# Patient Record
Sex: Female | Born: 1950 | Race: White | Hispanic: No | Marital: Married | Smoking: Never smoker
Health system: Southern US, Community
[De-identification: ages and names within clinical notes are randomized; demographics above are authoritative.]

## PROBLEM LIST (undated history)

## (undated) DIAGNOSIS — I8390 Asymptomatic varicose veins of unspecified lower extremity: Secondary | ICD-10-CM

## (undated) DIAGNOSIS — K219 Gastro-esophageal reflux disease without esophagitis: Secondary | ICD-10-CM

## (undated) DIAGNOSIS — F419 Anxiety disorder, unspecified: Secondary | ICD-10-CM

## (undated) DIAGNOSIS — F329 Major depressive disorder, single episode, unspecified: Secondary | ICD-10-CM

## (undated) DIAGNOSIS — G894 Chronic pain syndrome: Secondary | ICD-10-CM

## (undated) DIAGNOSIS — J189 Pneumonia, unspecified organism: Secondary | ICD-10-CM

## (undated) DIAGNOSIS — K449 Diaphragmatic hernia without obstruction or gangrene: Secondary | ICD-10-CM

## (undated) DIAGNOSIS — M1990 Unspecified osteoarthritis, unspecified site: Secondary | ICD-10-CM

## (undated) DIAGNOSIS — K2970 Gastritis, unspecified, without bleeding: Secondary | ICD-10-CM

## (undated) DIAGNOSIS — R5383 Other fatigue: Secondary | ICD-10-CM

## (undated) DIAGNOSIS — K589 Irritable bowel syndrome without diarrhea: Secondary | ICD-10-CM

## (undated) DIAGNOSIS — Z8781 Personal history of (healed) traumatic fracture: Secondary | ICD-10-CM

## (undated) DIAGNOSIS — F32A Depression, unspecified: Secondary | ICD-10-CM

## (undated) DIAGNOSIS — K314 Gastric diverticulum: Secondary | ICD-10-CM

## (undated) DIAGNOSIS — I10 Essential (primary) hypertension: Secondary | ICD-10-CM

## (undated) DIAGNOSIS — R51 Headache: Secondary | ICD-10-CM

## (undated) DIAGNOSIS — Z87442 Personal history of urinary calculi: Secondary | ICD-10-CM

## (undated) DIAGNOSIS — R112 Nausea with vomiting, unspecified: Secondary | ICD-10-CM

## (undated) DIAGNOSIS — K56609 Unspecified intestinal obstruction, unspecified as to partial versus complete obstruction: Secondary | ICD-10-CM

## (undated) HISTORY — DX: Personal history of (healed) traumatic fracture: Z87.81

## (undated) HISTORY — DX: Nausea with vomiting, unspecified: R11.2

## (undated) HISTORY — DX: Gastritis, unspecified, without bleeding: K29.70

## (undated) HISTORY — DX: Diaphragmatic hernia without obstruction or gangrene: K44.9

## (undated) HISTORY — DX: Pneumonia, unspecified organism: J18.9

## (undated) HISTORY — DX: Unspecified intestinal obstruction, unspecified as to partial versus complete obstruction: K56.609

## (undated) HISTORY — PX: OTHER SURGICAL HISTORY: SHX169

## (undated) HISTORY — PX: KNEE ARTHROSCOPY: SUR90

## (undated) HISTORY — DX: Irritable bowel syndrome, unspecified: K58.9

## (undated) HISTORY — DX: Other fatigue: R53.83

## (undated) HISTORY — DX: Unspecified osteoarthritis, unspecified site: M19.90

## (undated) HISTORY — DX: Asymptomatic varicose veins of unspecified lower extremity: I83.90

## (undated) HISTORY — DX: Gastric diverticulum: K31.4

---

## 1986-08-12 HISTORY — PX: ABDOMINAL HYSTERECTOMY: SHX81

## 1997-11-15 ENCOUNTER — Ambulatory Visit (HOSPITAL_COMMUNITY): Admission: RE | Admit: 1997-11-15 | Discharge: 1997-11-15 | Payer: Self-pay | Admitting: *Deleted

## 1999-06-05 ENCOUNTER — Ambulatory Visit (HOSPITAL_COMMUNITY): Admission: RE | Admit: 1999-06-05 | Discharge: 1999-06-05 | Payer: Self-pay | Admitting: Internal Medicine

## 1999-06-05 ENCOUNTER — Encounter: Payer: Self-pay | Admitting: Internal Medicine

## 2001-06-23 ENCOUNTER — Encounter: Admission: RE | Admit: 2001-06-23 | Discharge: 2001-09-21 | Payer: Self-pay | Admitting: Family Medicine

## 2002-08-08 ENCOUNTER — Encounter: Payer: Self-pay | Admitting: *Deleted

## 2002-08-08 ENCOUNTER — Observation Stay (HOSPITAL_COMMUNITY): Admission: EM | Admit: 2002-08-08 | Discharge: 2002-08-09 | Payer: Self-pay | Admitting: *Deleted

## 2002-08-08 ENCOUNTER — Encounter: Payer: Self-pay | Admitting: Emergency Medicine

## 2002-08-09 ENCOUNTER — Encounter: Payer: Self-pay | Admitting: Internal Medicine

## 2005-08-22 ENCOUNTER — Ambulatory Visit: Payer: Self-pay | Admitting: Critical Care Medicine

## 2005-09-05 ENCOUNTER — Ambulatory Visit: Payer: Self-pay | Admitting: Critical Care Medicine

## 2005-09-19 ENCOUNTER — Ambulatory Visit: Payer: Self-pay | Admitting: *Deleted

## 2005-09-19 ENCOUNTER — Encounter: Payer: Self-pay | Admitting: Critical Care Medicine

## 2005-10-10 ENCOUNTER — Ambulatory Visit: Payer: Self-pay | Admitting: Critical Care Medicine

## 2005-11-28 ENCOUNTER — Ambulatory Visit: Payer: Self-pay | Admitting: Internal Medicine

## 2005-12-19 ENCOUNTER — Ambulatory Visit (HOSPITAL_BASED_OUTPATIENT_CLINIC_OR_DEPARTMENT_OTHER): Admission: RE | Admit: 2005-12-19 | Discharge: 2005-12-19 | Payer: Self-pay | Admitting: Orthopedic Surgery

## 2006-01-30 ENCOUNTER — Ambulatory Visit: Payer: Self-pay | Admitting: Critical Care Medicine

## 2007-04-16 ENCOUNTER — Ambulatory Visit: Payer: Self-pay | Admitting: Critical Care Medicine

## 2007-06-26 DIAGNOSIS — J309 Allergic rhinitis, unspecified: Secondary | ICD-10-CM | POA: Insufficient documentation

## 2008-01-11 ENCOUNTER — Ambulatory Visit: Payer: Self-pay | Admitting: Critical Care Medicine

## 2008-04-01 ENCOUNTER — Encounter: Payer: Self-pay | Admitting: Critical Care Medicine

## 2008-04-27 ENCOUNTER — Emergency Department (HOSPITAL_BASED_OUTPATIENT_CLINIC_OR_DEPARTMENT_OTHER): Admission: EM | Admit: 2008-04-27 | Discharge: 2008-04-27 | Payer: Self-pay | Admitting: Emergency Medicine

## 2009-01-23 ENCOUNTER — Ambulatory Visit: Payer: Self-pay | Admitting: Critical Care Medicine

## 2009-06-06 ENCOUNTER — Ambulatory Visit (HOSPITAL_COMMUNITY): Admission: RE | Admit: 2009-06-06 | Discharge: 2009-06-06 | Payer: Self-pay | Admitting: Surgery

## 2009-06-07 ENCOUNTER — Ambulatory Visit: Payer: Self-pay | Admitting: Vascular Surgery

## 2009-06-07 ENCOUNTER — Encounter (INDEPENDENT_AMBULATORY_CARE_PROVIDER_SITE_OTHER): Payer: Self-pay | Admitting: Surgery

## 2009-06-07 ENCOUNTER — Ambulatory Visit (HOSPITAL_COMMUNITY): Admission: RE | Admit: 2009-06-07 | Discharge: 2009-06-07 | Payer: Self-pay | Admitting: Surgery

## 2009-06-21 ENCOUNTER — Ambulatory Visit (HOSPITAL_COMMUNITY): Admission: RE | Admit: 2009-06-21 | Discharge: 2009-06-21 | Payer: Self-pay | Admitting: Surgery

## 2009-06-26 ENCOUNTER — Encounter: Admission: RE | Admit: 2009-06-26 | Discharge: 2009-08-09 | Payer: Self-pay | Admitting: Surgery

## 2009-08-12 HISTORY — PX: HERNIA REPAIR: SHX51

## 2009-09-07 ENCOUNTER — Encounter: Admission: RE | Admit: 2009-09-07 | Discharge: 2009-12-06 | Payer: Self-pay | Admitting: Surgery

## 2009-09-12 DIAGNOSIS — K314 Gastric diverticulum: Secondary | ICD-10-CM

## 2009-09-12 DIAGNOSIS — K449 Diaphragmatic hernia without obstruction or gangrene: Secondary | ICD-10-CM

## 2009-09-12 HISTORY — DX: Diaphragmatic hernia without obstruction or gangrene: K44.9

## 2009-09-12 HISTORY — DX: Gastric diverticulum: K31.4

## 2009-09-28 ENCOUNTER — Ambulatory Visit (HOSPITAL_COMMUNITY): Admission: RE | Admit: 2009-09-28 | Discharge: 2009-09-28 | Payer: Self-pay | Admitting: Surgery

## 2009-11-13 ENCOUNTER — Inpatient Hospital Stay (HOSPITAL_COMMUNITY): Admission: AD | Admit: 2009-11-13 | Discharge: 2009-11-17 | Payer: Self-pay | Admitting: Surgery

## 2009-11-13 ENCOUNTER — Encounter (INDEPENDENT_AMBULATORY_CARE_PROVIDER_SITE_OTHER): Payer: Self-pay | Admitting: Surgery

## 2009-11-13 HISTORY — PX: GASTRIC BYPASS: SHX52

## 2009-11-14 ENCOUNTER — Ambulatory Visit: Payer: Self-pay | Admitting: Vascular Surgery

## 2009-11-14 ENCOUNTER — Encounter (INDEPENDENT_AMBULATORY_CARE_PROVIDER_SITE_OTHER): Payer: Self-pay | Admitting: Surgery

## 2009-12-21 ENCOUNTER — Ambulatory Visit (HOSPITAL_COMMUNITY): Admission: RE | Admit: 2009-12-21 | Discharge: 2009-12-21 | Payer: Self-pay | Admitting: Surgery

## 2010-01-05 ENCOUNTER — Emergency Department (HOSPITAL_COMMUNITY): Admission: EM | Admit: 2010-01-05 | Discharge: 2010-01-05 | Payer: Self-pay | Admitting: Emergency Medicine

## 2010-01-18 ENCOUNTER — Encounter: Admission: RE | Admit: 2010-01-18 | Discharge: 2010-01-18 | Payer: Self-pay | Admitting: Surgery

## 2010-02-05 ENCOUNTER — Inpatient Hospital Stay (HOSPITAL_COMMUNITY): Admission: EM | Admit: 2010-02-05 | Discharge: 2010-02-08 | Payer: Self-pay | Admitting: Emergency Medicine

## 2010-02-05 ENCOUNTER — Ambulatory Visit: Payer: Self-pay | Admitting: Cardiology

## 2010-02-06 ENCOUNTER — Ambulatory Visit: Payer: Self-pay | Admitting: Vascular Surgery

## 2010-02-06 ENCOUNTER — Encounter (INDEPENDENT_AMBULATORY_CARE_PROVIDER_SITE_OTHER): Payer: Self-pay | Admitting: Internal Medicine

## 2010-02-07 ENCOUNTER — Encounter (INDEPENDENT_AMBULATORY_CARE_PROVIDER_SITE_OTHER): Payer: Self-pay | Admitting: Internal Medicine

## 2010-02-27 ENCOUNTER — Inpatient Hospital Stay (HOSPITAL_COMMUNITY): Admission: EM | Admit: 2010-02-27 | Discharge: 2010-03-01 | Payer: Self-pay | Admitting: Surgery

## 2010-03-01 ENCOUNTER — Encounter: Payer: Self-pay | Admitting: Internal Medicine

## 2010-03-23 ENCOUNTER — Encounter (HOSPITAL_COMMUNITY)
Admission: RE | Admit: 2010-03-23 | Discharge: 2010-05-09 | Payer: Self-pay | Source: Home / Self Care | Admitting: Surgery

## 2010-04-18 ENCOUNTER — Ambulatory Visit (HOSPITAL_COMMUNITY): Admission: RE | Admit: 2010-04-18 | Discharge: 2010-04-18 | Payer: Self-pay | Admitting: Surgery

## 2010-04-20 ENCOUNTER — Encounter: Payer: Self-pay | Admitting: Internal Medicine

## 2010-04-25 ENCOUNTER — Encounter (INDEPENDENT_AMBULATORY_CARE_PROVIDER_SITE_OTHER): Payer: Self-pay | Admitting: *Deleted

## 2010-04-25 ENCOUNTER — Encounter: Admission: RE | Admit: 2010-04-25 | Discharge: 2010-05-11 | Payer: Self-pay | Admitting: Surgery

## 2010-05-01 ENCOUNTER — Ambulatory Visit: Payer: Self-pay | Admitting: Internal Medicine

## 2010-05-01 DIAGNOSIS — I10 Essential (primary) hypertension: Secondary | ICD-10-CM | POA: Insufficient documentation

## 2010-05-01 DIAGNOSIS — K219 Gastro-esophageal reflux disease without esophagitis: Secondary | ICD-10-CM | POA: Insufficient documentation

## 2010-05-01 DIAGNOSIS — F32A Depression, unspecified: Secondary | ICD-10-CM | POA: Insufficient documentation

## 2010-05-01 DIAGNOSIS — Z9884 Bariatric surgery status: Secondary | ICD-10-CM | POA: Insufficient documentation

## 2010-05-01 DIAGNOSIS — M1990 Unspecified osteoarthritis, unspecified site: Secondary | ICD-10-CM

## 2010-05-01 DIAGNOSIS — F329 Major depressive disorder, single episode, unspecified: Secondary | ICD-10-CM | POA: Insufficient documentation

## 2010-05-01 DIAGNOSIS — A0472 Enterocolitis due to Clostridium difficile, not specified as recurrent: Secondary | ICD-10-CM | POA: Insufficient documentation

## 2010-05-01 HISTORY — DX: Unspecified osteoarthritis, unspecified site: M19.90

## 2010-07-26 ENCOUNTER — Encounter
Admission: RE | Admit: 2010-07-26 | Discharge: 2010-09-11 | Payer: Self-pay | Source: Home / Self Care | Attending: Surgery | Admitting: Surgery

## 2010-08-12 HISTORY — PX: EXPLORATORY LAPAROTOMY: SUR591

## 2010-09-11 NOTE — Letter (Signed)
Summary: Discharge Summary  Discharge Summary   Imported By: Florinda Marker 05/07/2010 10:40:55  _____________________________________________________________________  External Attachment:    Type:   Image     Comment:   External Document

## 2010-09-11 NOTE — Consult Note (Signed)
Summary: New Pt. Referral: Dr. Luretha Murphy  New Pt. Referral: Dr. Luretha Murphy   Imported By: Florinda Marker 05/07/2010 10:35:30  _____________________________________________________________________  External Attachment:    Type:   Image     Comment:   External Document

## 2010-09-11 NOTE — Assessment & Plan Note (Signed)
Summary: Pulmonary OV   Primary Provider/Referring Provider:  Crawford Givens   CC:  Pt here for follow-up.   She says she is doing well.Marland Kitchen  History of Present Illness: Ms. 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     January 23, 2009 3:20 PM at last ov  1)  Stop Brovex 2)  Start Astepro two sprays each nostril twice daily 3)  Continue Flonase two sprays each nostril daily 4)  Stay on omeprazole daily Not seen since 6/09.  Not coughing much now.  Pn drip and GERD symptoms better.  Not hoarse. Only dyspneic if air is humid Pt denies any significant sore throat, nasal congestion or excess secretions, fever, chills, sweats, unintended weight loss, pleurtic or exertional chest pain, orthopnea PND, or leg swelling    Current Medications (verified): 1)  Avapro 300 Mg  Tabs (Irbesartan) .... One By Mouth Once Daily 2)  Cvs Omeprazole 20 Mg  Tbec (Omeprazole) .... One By Mouth Once Daily 3)  Paxil 30 Mg  Tabs (Paroxetine Hcl) .... One By Mouth Once Daily 4)  Astelin 137 Mcg/spray Soln (Azelastine Hcl) .Marland Kitchen.. 1 Spray in Each Nostril Two Times A Day As Needed 5)  Fluticasone Propionate 50 Mcg/act Susp (Fluticasone Propionate) .Marland Kitchen.. 1 Spray in Each Nostril Two Times A Day As Needed  Allergies (verified): 1)  ! * Ivp Dye 2)  ! Ace Inhibitors  Past History:  Past Medical History: Allergic Rhinitis G E R D  Social History: Patient never smoked.  Occupation:  Electronics engineer for [employer]  Review of Systems       The patient complains of non-productive cough.  The patient denies shortness of breath with activity, shortness of breath at rest, productive cough, coughing up blood, chest pain, irregular heartbeats, acid heartburn, indigestion, loss of appetite, weight change, abdominal pain, difficulty swallowing, sore throat, tooth/dental problems, headaches, nasal congestion/difficulty  breathing through nose, sneezing, itching, ear ache, anxiety, depression, hand/feet swelling, joint stiffness or pain, rash, change in color of mucus, and fever.    Vital Signs:  Patient profile:   60 year old female Height:      68 inches (172.72 cm) Weight:      294.13 pounds (133.70 kg) BMI:     44.88 O2 Sat:      98 % on Room air Temp:     98.6 degrees F (37.00 degrees C) oral Pulse rate:   92 / minute BP sitting:   140 / 86  (left arm) Cuff size:   large  Vitals Entered By: Michel Bickers CMA (January 23, 2009 3:17 PM)  O2 Flow:  Room air  Physical Exam  Additional Exam:  Gen: Pleasant, well nourished, in no distress ENT: no lesions, no postnasal drip Neck: No JVD, no TMG, no carotid bruits Lungs: no use of accessory muscles, no dullness to percussion, clear without rales or rhonchi Cardiovascular: RRR, heart sounds normal, no murmurs or gallops, no peripheral edema Musculoskeletal: No deformities, no cyanosis or clubbing     Impression & Recommendations:  Problem # 1:  ALLERGIC RHINITIS (ICD-477.9) Assessment Improved Cyclic cough due to prior ACE inhibitor use, GERD, allergic rhinitis with PN drip syndrome plan cont astepro and flonase Her updated medication list for this problem includes:    Astepro 0.15 % Soln (Azelastine hcl) .Marland Kitchen..Marland Kitchen Two sprays each nostril daily    Fluticasone Propionate 50 Mcg/act Susp (Fluticasone propionate) .Marland KitchenMarland KitchenMarland KitchenMarland Kitchen  1 spray in each nostril two times a day as needed  Orders: Est. Patient Level III (04540)  Medications Added to Medication List This Visit: 1)  Astepro 0.15 % Soln (Azelastine hcl) .... Two sprays each nostril daily 2)  Fluticasone Propionate 50 Mcg/act Susp (Fluticasone propionate) .Marland Kitchen.. 1 spray in each nostril two times a day as needed  Complete Medication List: 1)  Avapro 300 Mg Tabs (Irbesartan) .... One by mouth once daily 2)  Cvs Omeprazole 20 Mg Tbec (Omeprazole) .... One by mouth once daily 3)  Paxil 30 Mg Tabs (Paroxetine hcl)  .... One by mouth once daily 4)  Astepro 0.15 % Soln (Azelastine hcl) .... Two sprays each nostril daily 5)  Fluticasone Propionate 50 Mcg/act Susp (Fluticasone propionate) .Marland Kitchen.. 1 spray in each nostril two times a day as needed  Patient Instructions: 1)  No change in medications 2)  Refills sent for astepro/omeprazole/flonase to [facility] 3)  Return 12months or as needed Prescriptions: ASTEPRO 0.15 % SOLN (AZELASTINE HCL) Two sprays each nostril daily Brand medically necessary #3 x 4   Entered and Authorized by:   Storm Frisk MD   Signed by:   Storm Frisk MD on 01/23/2009   Method used:   Faxed to ...       [facility]* (mail-order)             ,          Ph: [phone]       Fax: [phone]   RxID:   [id number] [facility] OMEPRAZOLE 20 MG  TBEC (OMEPRAZOLE) one by mouth once daily  #90 x 4   Entered and Authorized by:   Storm Frisk MD   Signed by:   Storm Frisk MD on 01/23/2009   Method used:   Faxed to ...       [facility]* (mail-order)             ,          Ph: [phone]       Fax: [phone]   RxID:   [id number] FLUTICASONE PROPIONATE 50 MCG/ACT SUSP (FLUTICASONE PROPIONATE) 1 spray in each nostril two times a day as needed  #3 x 4   Entered and Authorized by:   Storm Frisk MD   Signed by:   Storm Frisk MD on 01/23/2009   Method used:   Faxed to ...       [facility]* (mail-order)             ,          Ph: [phone]       Fax: [phone]   RxID:   [id number]

## 2010-09-11 NOTE — Medication Information (Signed)
Summary: Astelin nasal spray/[facility]  Astelin nasal spray/[facility]   Imported By: Lester Carlin 04/27/2008 07:47:22  _____________________________________________________________________  External Attachment:    Type:   Image     Comment:   External Document

## 2010-09-11 NOTE — Assessment & Plan Note (Signed)
Summary: new pt c diff/ liver lesions/   Primary Provider:  Crawford Givens   CC:  lesions on liver found on liver on CT.  History of Present Illness: Kari Gibbs is a 60 year old who was referred to be by Dr. Daphine Deutscher for evaluation of a possible liver abscess.  She underwent a gastric bypass in April of this year.  Initially she did well postoperatively but then she started to feel much worse. She developed a horrible taste in her mouth, worsening acid reflux, worsening chronic epigastric pain, and diarrhea.  She was found to have Clostridium difficile colitis on June 21 and was started on oral Flagyl which he took for about 3 weeks.  She had partial improvement but a repeat tests showed that the colitis was still present.  She was hospitalized and started on Flagyl again.  Eventually she was treated with vancomycin enemas giving a total of 8 treatments completing therapy on August 26.  She says that she felt "like crap when she was on the antibiotics." She saw Dr. Daphine Deutscher about two weeks ago when told him about this and he ordered a CT scan of her abdomen and pelvis.  There were no changes to suggest any postoperative complications.  The colitis appeared to have resolved.  However there was a 2 x 3.2 cm lesion in the liver near the falciform ligament that was not seen on prior scans.  Of note previous scans have not used IV contrast.  Over the last two weeks she has felt much better.  She's not had any fever, chills, or sweats.  Her diarrhea has resolved and her appetite is improving.  A horrible taste in her mouth is also improving.  She is now working full-time at home, getting out of the house more and returning to normal activities.  She does continue to have intermittent sharp epigastric pain that she describes feeling like a "hot poker". This pain is chronic and dates back at least 6 years prior to her surgery.  It does not sound like it has changed in severity or frequency.  Preventive  Screening-Counseling & Management  Alcohol-Tobacco     Alcohol drinks/day: 0     Smoking Status: never  Caffeine-Diet-Exercise     Caffeine use/day: no     Does Patient Exercise: no  Safety-Violence-Falls     Seat Belt Use: yes   Current Allergies (reviewed today): ! * IVP DYE ! ACE INHIBITORS ! LOSARTAN POTASSIUM (LOSARTAN POTASSIUM) Past History:  Past Medical History: Allergic rhinitis Hypertension GERD Depression Osteoarthritis  Past Surgical History: Appendectomy Exploratory laparotomy Gastric bypass Hysterectomy  Vital Signs:  Patient profile:   60 year old female Height:      68 inches (172.72 cm) Weight:      195.0 pounds (88.64 kg) BMI:     29.76 Temp:     98.0 degrees F (36.67 degrees C) oral Pulse rate:   96 / minute BP sitting:   111 / 77  (left arm) Cuff size:   large  Vitals Entered By: Jennet Maduro RN (May 01, 2010 2:29 PM) CC: lesions on liver found on liver on CT Is Patient Diabetic? No Pain Assessment Patient in pain? no      Nutritional Status BMI of > 30 = obese Nutritional Status Detail appetite "OK, not great"  Have you ever been in a relationship where you felt threatened, hurt or afraid?not asked, husband present   Does patient need assistance? Functional Status Self care Ambulation Normal  Physical Exam  General:  alert and overweight-appearing.  she has lost 39 kg since her surgery in April. Lungs:  normal breath sounds, no crackles, and no wheezes.   Heart:  normal rate, regular rhythm, and no murmur.   Abdomen:  soft, non-tender, and normal bowel sounds.  her surgical incisions are well healed.   Impression & Recommendations:  Problem # 1:  LIVER LESION (ICD-580.81) I have not actually certain what is causing her liver lesion but her recent improvement suggests that this is probably not a liver abscess.  I discussed the CT findings with Dr. Norva Pavlov who read the scan.  She felt like the small fluid  collection could be benign ascites or a postoperative change from her laparoscopic surgery. I have discussed options with Kari Gibbs and has suggested that it may be best to take a wait and see approach.  As long as she is continuing to improve I am comfortable not doing any further diagnostic evaluation of this fluid collection.  Of course if she has fever or other signs of infection then I would discuss needle aspiration of the collection with interventional radiology.  She and her husband are comfortable with this recommendation. Orders: Consultation Level IV (16109)  Medications Added to Medication List This Visit: 1)  Align Caps (Probiotic product) .... Take 1 capsule by mouth at bedtime per pt. 2)  Melatonin 5 Mg Tabs (Melatonin) .... Take 1 tablet by mouth at bedtime per pt.  Patient Instructions: 1)  Please schedule a follow-up appointment in 4 weeks.

## 2010-09-11 NOTE — Assessment & Plan Note (Signed)
Summary: Pulmonary OV   Chief Complaint:  fu office visit...Marland KitchenMarland KitchenMarland Kitchenreviewed meds.....  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  She has done well since being switched to Avapro and is off lisinopril, maintains fluticasone 2 sprays each nostril daily, omeprazole 20 mg daily.  She is also on brompheniramine p.r.n.   here for f/u and med review:  Last few months her cough is stable.  In the springtime she gets more postnasal drip.  she cannot access brovex any longer  (it is off the market).  She does use flonase daily..  There is no heartburn.  There is no chest pain.  She is here for pulmonary f/u.       Current Allergies: ! * IVP DYE ! ACE INHIBITORS  Past Medical History:    Reviewed history from 06/26/2007 and no changes required:       Allergic Rhinitis       G E R D              Current Meds:        AVAPRO 300 MG  TABS (IRBESARTAN) one by mouth once daily       CVS OMEPRAZOLE 20 MG  TBEC (OMEPRAZOLE) one by mouth once daily       PAXIL 30 MG  TABS (PAROXETINE HCL) one by mouth once daily       BROVEX ADT 12-10 MG/5ML  SUSP (BROMPHEN TANN-PHENYLEPH TANN) 5cc by mouth two times a day as needed              Allergies: * IVP DYE, ACE INHIBITORS.     Family History:    Reviewed history and no changes required:       non contrib  Social History:    Reviewed history and no changes required:       Patient never smoked.    Risk Factors:  Tobacco use:  never   Review of Systems      See HPI  The patient denies anorexia, fever, weight loss, weight gain, hoarseness, chest pain, syncope, dyspnea on exhertion, peripheral edema, and prolonged cough.     Vital Signs:  Patient Profile:   60 Years Old Female Height:     66 inches Weight:      281.13 pounds BMI:     45.54 O2 Sat:      94 % O2 treatment:    Room Air Temp:     98.0  degrees F oral Pulse rate:   71 / minute BP sitting:   140 / 88  (left arm) Cuff size:   regular  Pt. in pain?   no  Vitals Entered By: Clarise Cruz Duncan Dull) (January 11, 2008 2:09 PM)                  Physical Exam  Gen: obest WF        in NAD    NCAT Heent:  no jvd, no TMG, no cervical LNademopathy, orophyx clear,  nares with clear watery drainage. Cor: RRR nl s1/s2  no s3/s4  no m r h g Abd: soft NT BSA   no masses  No HSM  no rebound or guarding Ext perfused with no c v e v.d Neuro: intact, moves all 4s, CN II-XII intact, DTRs intact Chest:  no wheezes, rales, rhonchi   no egophony  no consolidative  breath sounds, Skin: clear  Genital/Rectal :deferred      Impression & Recommendations:  Problem # 1:  G E R D (ICD-530.81) Assessment: Improved Stable reflux disease plan: cont daily PPI  Her updated medication list for this problem includes:    Cvs Omeprazole 20 Mg Tbec (Omeprazole) ..... One by mouth once daily  Orders: Est. Patient Level III (82956)   Problem # 2:  ALLERGIC RHINITIS (ICD-477.9) Assessment: Improved Post nasal drip syndrome exacerbating cyclic cough plan: Add astelin twice daily Cont flonase daily stop Brovex  Her updated medication list for this problem includes:    Astelin 137 Mcg/spray Soln (Azelastine hcl) .Marland Kitchen..Marland Kitchen Two sprays each nostril two times a day    Flonase 50 Mcg/act Susp (Fluticasone propionate) .Marland Kitchen..Marland Kitchen Two puffs each nostril daily  Orders: Est. Patient Level III (21308)   Medications Added to Medication List This Visit: 1)  Astelin 137 Mcg/spray Soln (Azelastine hcl) .... Two sprays each nostril two times a day 2)  Flonase 50 Mcg/act Susp (Fluticasone propionate) .... Two puffs each nostril daily   Patient Instructions: 1)  Stop Brovex 2)  Start Astepro two sprays each nostril twice daily 3)  Continue Flonase two sprays each nostril daily 4)  Stay on omeprazole daily 5)  Please schedule a follow-up appointment in 6  months.   Prescriptions: FLONASE 50 MCG/ACT  SUSP (FLUTICASONE PROPIONATE) Two puffs each nostril daily  #3 x 4   Entered and Authorized by:   Storm Frisk MD   Signed by:   Storm Frisk MD on 01/11/2008   Method used:   Print then Give to Patient   RxID:   [id number] ASTELIN 137 MCG/SPRAY  SOLN (AZELASTINE HCL) two sprays each nostril two times a day Brand medically necessary #3 x 4   Entered and Authorized by:   Storm Frisk MD   Signed by:   Storm Frisk MD on 01/11/2008   Method used:   Print then Give to Patient   RxID:   [id number]  ]

## 2010-09-11 NOTE — Miscellaneous (Signed)
Summary: HIPAA Restrictions  HIPAA Restrictions   Imported By: Florinda Marker 05/03/2010 16:03:59  _____________________________________________________________________  External Attachment:    Type:   Image     Comment:   External Document

## 2010-09-11 NOTE — Miscellaneous (Signed)
Summary: Problems, Medications and Allergies updated  Clinical Lists Changes  Problems: Added new problem of BARIATRIC SURGERY STATUS (ICD-V45.86) Added new problem of CLOSTRIDIUM DIFFICILE COLITIS (ICD-008.45) Medications: Added new medication of CALCIUM-VITAMIN D 500-200 MG-UNIT TABS (CALCIUM CARBONATE-VITAMIN D) Take 1 tablet by mouth three times a day Added new medication of FLONASE 50 MCG/ACT SUSP (FLUTICASONE PROPIONATE) One spray each nostril every morning Added new medication of MICARDIS 40 MG TABS (TELMISARTAN) Take 1 tablet by mouth once a day per PCP Added new medication of MULTIVITAMINS  TABS (MULTIPLE VITAMIN) Take 1 tablet by mouth once a day Added new medication of VITAMIN B-12 500 MCG TABS (CYANOCOBALAMIN) Take 1 tablet by mouth once a day Allergies: Added new allergy or adverse reaction of LOSARTAN POTASSIUM (LOSARTAN POTASSIUM)

## 2010-10-17 ENCOUNTER — Encounter: Payer: Self-pay | Admitting: *Deleted

## 2010-10-27 LAB — COMPREHENSIVE METABOLIC PANEL
ALT: 27 U/L (ref 0–35)
AST: 38 U/L — ABNORMAL HIGH (ref 0–37)
Albumin: 3.9 g/dL (ref 3.5–5.2)
Alkaline Phosphatase: 61 U/L (ref 39–117)
BUN: 8 mg/dL (ref 6–23)
CO2: 21 mEq/L (ref 19–32)
Calcium: 9.3 mg/dL (ref 8.4–10.5)
Chloride: 104 mEq/L (ref 96–112)
Creatinine, Ser: 0.87 mg/dL (ref 0.4–1.2)
GFR calc Af Amer: >60 mL/min (ref >60)
GFR calc non Af Amer: >60 mL/min (ref >60)
Glucose, Bld: 100 mg/dL — ABNORMAL HIGH (ref 70–99)
Potassium: 3.2 mEq/L — ABNORMAL LOW (ref 3.5–5.1)
Sodium: 139 mEq/L (ref 135–145)
Total Bilirubin: 1 mg/dL (ref 0.3–1.2)
Total Protein: 6.8 g/dL (ref 6.0–8.3)

## 2010-10-27 LAB — DIFFERENTIAL
Basophils Absolute: 0 10*3/uL (ref 0.0–0.1)
Basophils Relative: 0 % (ref 0–1)
Eosinophils Absolute: 0.1 10*3/uL (ref 0.0–0.7)
Eosinophils Relative: 2 % (ref 0–5)
Lymphocytes Relative: 27 % (ref 12–46)
Lymphs Abs: 1.5 10*3/uL (ref 0.7–4.0)
Monocytes Absolute: 0.7 10*3/uL (ref 0.1–1.0)
Monocytes Relative: 12 % (ref 3–12)
Neutro Abs: 3.3 10*3/uL (ref 1.7–7.7)
Neutrophils Relative %: 59 % (ref 43–77)

## 2010-10-27 LAB — CBC
HCT: 40.4 % (ref 36.0–46.0)
Hemoglobin: 13.8 g/dL (ref 12.0–15.0)
MCH: 30 pg (ref 26.0–34.0)
MCHC: 34.2 g/dL (ref 30.0–36.0)
MCV: 87.7 fL (ref 78.0–100.0)
Platelets: 247 10*3/uL (ref 150–400)
RBC: 4.6 MIL/uL (ref 3.87–5.11)
RDW: 15.3 % (ref 11.5–15.5)
WBC: 5.7 10*3/uL (ref 4.0–10.5)

## 2010-10-27 LAB — URINALYSIS, MICROSCOPIC ONLY
Glucose, UA: NEGATIVE mg/dL
Ketones, ur: 15 mg/dL — AB
Nitrite: NEGATIVE
Protein, ur: NEGATIVE mg/dL
Specific Gravity, Urine: 1.015 (ref 1.005–1.030)
Urobilinogen, UA: 0.2 mg/dL (ref 0.0–1.0)
pH: 5.5 (ref 5.0–8.0)

## 2010-10-27 LAB — LIPASE, BLOOD: Lipase: 27 U/L (ref 11–59)

## 2010-10-27 LAB — AMYLASE: Amylase: 30 U/L (ref 0–105)

## 2010-10-28 LAB — CBC
HCT: 37.7 % (ref 36.0–46.0)
HCT: 41.1 % (ref 36.0–46.0)
Hemoglobin: 12.7 g/dL (ref 12.0–15.0)
Hemoglobin: 14.2 g/dL (ref 12.0–15.0)
MCH: 30 pg (ref 26.0–34.0)
MCH: 30.5 pg (ref 26.0–34.0)
MCHC: 33.7 g/dL (ref 30.0–36.0)
MCHC: 34.5 g/dL (ref 30.0–36.0)
MCV: 88.5 fL (ref 78.0–100.0)
MCV: 89 fL (ref 78.0–100.0)
Platelets: 219 10*3/uL (ref 150–400)
Platelets: 266 10*3/uL (ref 150–400)
RBC: 4.24 MIL/uL (ref 3.87–5.11)
RBC: 4.64 MIL/uL (ref 3.87–5.11)
RDW: 15.1 % (ref 11.5–15.5)
RDW: 15.5 % (ref 11.5–15.5)
WBC: 5.6 10*3/uL (ref 4.0–10.5)
WBC: 6 10*3/uL (ref 4.0–10.5)

## 2010-10-28 LAB — DIFFERENTIAL
Basophils Absolute: 0 10*3/uL (ref 0.0–0.1)
Basophils Relative: 0 % (ref 0–1)
Eosinophils Absolute: 0.1 10*3/uL (ref 0.0–0.7)
Eosinophils Relative: 2 % (ref 0–5)
Lymphocytes Relative: 32 % (ref 12–46)
Lymphs Abs: 1.9 10*3/uL (ref 0.7–4.0)
Monocytes Absolute: 0.6 10*3/uL (ref 0.1–1.0)
Monocytes Relative: 10 % (ref 3–12)
Neutro Abs: 3.4 10*3/uL (ref 1.7–7.7)
Neutrophils Relative %: 57 % (ref 43–77)

## 2010-10-28 LAB — COMPREHENSIVE METABOLIC PANEL
ALT: 22 U/L (ref 0–35)
AST: 31 U/L (ref 0–37)
Albumin: 3.7 g/dL (ref 3.5–5.2)
Alkaline Phosphatase: 75 U/L (ref 39–117)
BUN: 6 mg/dL (ref 6–23)
CO2: 28 mEq/L (ref 19–32)
Calcium: 8.9 mg/dL (ref 8.4–10.5)
Chloride: 104 mEq/L (ref 96–112)
Creatinine, Ser: 0.63 mg/dL (ref 0.4–1.2)
GFR calc Af Amer: >60 mL/min (ref >60)
GFR calc non Af Amer: >60 mL/min (ref >60)
Glucose, Bld: 105 mg/dL — ABNORMAL HIGH (ref 70–99)
Potassium: 2.7 mEq/L — CL (ref 3.5–5.1)
Sodium: 141 mEq/L (ref 135–145)
Total Bilirubin: 1 mg/dL (ref 0.3–1.2)
Total Protein: 6.5 g/dL (ref 6.0–8.3)

## 2010-10-28 LAB — BASIC METABOLIC PANEL
BUN: 4 mg/dL — ABNORMAL LOW (ref 6–23)
BUN: 5 mg/dL — ABNORMAL LOW (ref 6–23)
BUN: 7 mg/dL (ref 6–23)
CO2: 22 mEq/L (ref 19–32)
CO2: 29 mEq/L (ref 19–32)
CO2: 30 mEq/L (ref 19–32)
Calcium: 8.4 mg/dL (ref 8.4–10.5)
Calcium: 8.7 mg/dL (ref 8.4–10.5)
Calcium: 8.9 mg/dL (ref 8.4–10.5)
Chloride: 104 mEq/L (ref 96–112)
Chloride: 108 mEq/L (ref 96–112)
Chloride: 108 mEq/L (ref 96–112)
Creatinine, Ser: 0.59 mg/dL (ref 0.4–1.2)
Creatinine, Ser: 0.66 mg/dL (ref 0.4–1.2)
Creatinine, Ser: 0.76 mg/dL (ref 0.4–1.2)
GFR calc Af Amer: >60 mL/min (ref >60)
GFR calc Af Amer: >60 mL/min (ref >60)
GFR calc Af Amer: >60 mL/min (ref >60)
GFR calc non Af Amer: >60 mL/min (ref >60)
GFR calc non Af Amer: >60 mL/min (ref >60)
GFR calc non Af Amer: >60 mL/min (ref >60)
Glucose, Bld: 132 mg/dL — ABNORMAL HIGH (ref 70–99)
Glucose, Bld: 93 mg/dL (ref 70–99)
Glucose, Bld: 95 mg/dL (ref 70–99)
Potassium: 3.3 mEq/L — ABNORMAL LOW (ref 3.5–5.1)
Potassium: 3.8 mEq/L (ref 3.5–5.1)
Potassium: 4.1 mEq/L (ref 3.5–5.1)
Sodium: 139 mEq/L (ref 135–145)
Sodium: 141 mEq/L (ref 135–145)
Sodium: 144 mEq/L (ref 135–145)

## 2010-10-28 LAB — URINALYSIS, ROUTINE W REFLEX MICROSCOPIC
Glucose, UA: NEGATIVE mg/dL
Hgb urine dipstick: NEGATIVE
Ketones, ur: 15 mg/dL — AB
Nitrite: POSITIVE — AB
Protein, ur: NEGATIVE mg/dL
Specific Gravity, Urine: 1.015 (ref 1.005–1.030)
Urobilinogen, UA: 0.2 mg/dL (ref 0.0–1.0)
pH: 6 (ref 5.0–8.0)

## 2010-10-28 LAB — URINE CULTURE: Colony Count: 35000

## 2010-10-28 LAB — POCT CARDIAC MARKERS
CKMB, poc: <1 ng/mL — ABNORMAL LOW (ref 1.0–8.0)
Myoglobin, poc: 60 ng/mL (ref 12–200)
Troponin i, poc: <0.05 ng/mL (ref 0.00–0.09)

## 2010-10-28 LAB — GLUCOSE, CAPILLARY
Glucose-Capillary: 100 mg/dL — ABNORMAL HIGH (ref 70–99)
Glucose-Capillary: 104 mg/dL — ABNORMAL HIGH (ref 70–99)
Glucose-Capillary: 108 mg/dL — ABNORMAL HIGH (ref 70–99)
Glucose-Capillary: 87 mg/dL (ref 70–99)
Glucose-Capillary: 88 mg/dL (ref 70–99)
Glucose-Capillary: 91 mg/dL (ref 70–99)
Glucose-Capillary: 93 mg/dL (ref 70–99)
Glucose-Capillary: 94 mg/dL (ref 70–99)
Glucose-Capillary: 98 mg/dL (ref 70–99)

## 2010-10-28 LAB — URINE MICROSCOPIC-ADD ON

## 2010-10-28 LAB — TROPONIN I
Troponin I: 0.01 ng/mL (ref 0.00–0.06)
Troponin I: 0.01 ng/mL (ref 0.00–0.06)
Troponin I: 0.02 ng/mL (ref 0.00–0.06)

## 2010-10-28 LAB — CK TOTAL AND CKMB (NOT AT ARMC)
CK, MB: 0.4 ng/mL (ref 0.3–4.0)
CK, MB: 0.4 ng/mL (ref 0.3–4.0)
CK, MB: 0.5 ng/mL (ref 0.3–4.0)
Relative Index: INVALID (ref 0.0–2.5)
Relative Index: INVALID (ref 0.0–2.5)
Relative Index: INVALID (ref 0.0–2.5)
Total CK: 39 U/L (ref 7–177)
Total CK: 45 U/L (ref 7–177)
Total CK: 46 U/L (ref 7–177)

## 2010-10-28 LAB — D-DIMER, QUANTITATIVE: D-Dimer, Quant: <0.22 ug/mL-FEU (ref 0.00–0.48)

## 2010-10-29 LAB — URINALYSIS, ROUTINE W REFLEX MICROSCOPIC
Glucose, UA: NEGATIVE mg/dL
Ketones, ur: 15 mg/dL — AB
Leukocytes, UA: NEGATIVE
Nitrite: NEGATIVE
Protein, ur: 30 mg/dL — AB
Specific Gravity, Urine: 1.026 (ref 1.005–1.030)
Urobilinogen, UA: 0.2 mg/dL (ref 0.0–1.0)
pH: 5.5 (ref 5.0–8.0)

## 2010-10-29 LAB — POCT I-STAT, CHEM 8
BUN: 11 mg/dL (ref 6–23)
Calcium, Ion: 1.09 mmol/L — ABNORMAL LOW (ref 1.12–1.32)
Chloride: 108 mEq/L (ref 96–112)
Creatinine, Ser: 0.6 mg/dL (ref 0.4–1.2)
Glucose, Bld: 106 mg/dL — ABNORMAL HIGH (ref 70–99)
HCT: 44 % (ref 36.0–46.0)
Hemoglobin: 15 g/dL (ref 12.0–15.0)
Potassium: 3.6 mEq/L (ref 3.5–5.1)
Sodium: 142 mEq/L (ref 135–145)
TCO2: 22 mmol/L (ref 0–100)

## 2010-10-29 LAB — DIFFERENTIAL
Basophils Absolute: 0 10*3/uL (ref 0.0–0.1)
Basophils Relative: 1 % (ref 0–1)
Eosinophils Absolute: 0.1 10*3/uL (ref 0.0–0.7)
Eosinophils Relative: 1 % (ref 0–5)
Lymphocytes Relative: 24 % (ref 12–46)
Lymphs Abs: 1.2 10*3/uL (ref 0.7–4.0)
Monocytes Absolute: 0.4 10*3/uL (ref 0.1–1.0)
Monocytes Relative: 8 % (ref 3–12)
Neutro Abs: 3.2 10*3/uL (ref 1.7–7.7)
Neutrophils Relative %: 66 % (ref 43–77)

## 2010-10-29 LAB — CBC
HCT: 43.2 % (ref 36.0–46.0)
Hemoglobin: 14.9 g/dL (ref 12.0–15.0)
MCHC: 34.5 g/dL (ref 30.0–36.0)
MCV: 89 fL (ref 78.0–100.0)
Platelets: 187 10*3/uL (ref 150–400)
RBC: 4.85 MIL/uL (ref 3.87–5.11)
RDW: 15 % (ref 11.5–15.5)
WBC: 4.8 10*3/uL (ref 4.0–10.5)

## 2010-10-29 LAB — URINE MICROSCOPIC-ADD ON

## 2010-10-29 LAB — URINE CULTURE: Colony Count: 6000

## 2010-10-31 LAB — CBC
HCT: 35 % — ABNORMAL LOW (ref 36.0–46.0)
HCT: 38.6 % (ref 36.0–46.0)
Hemoglobin: 11.8 g/dL — ABNORMAL LOW (ref 12.0–15.0)
Hemoglobin: 12.8 g/dL (ref 12.0–15.0)
MCHC: 33.1 g/dL (ref 30.0–36.0)
MCHC: 33.7 g/dL (ref 30.0–36.0)
MCV: 88.7 fL (ref 78.0–100.0)
MCV: 89.2 fL (ref 78.0–100.0)
Platelets: 248 10*3/uL (ref 150–400)
Platelets: 249 10*3/uL (ref 150–400)
RBC: 3.94 MIL/uL (ref 3.87–5.11)
RBC: 4.33 MIL/uL (ref 3.87–5.11)
RDW: 14.1 % (ref 11.5–15.5)
RDW: 14.3 % (ref 11.5–15.5)
WBC: 11.8 10*3/uL — ABNORMAL HIGH (ref 4.0–10.5)
WBC: 11.8 10*3/uL — ABNORMAL HIGH (ref 4.0–10.5)

## 2010-10-31 LAB — CLOTEST (H. PYLORI), BIOPSY: Helicobacter screen: NEGATIVE

## 2010-10-31 LAB — DIFFERENTIAL
Basophils Absolute: 0 10*3/uL (ref 0.0–0.1)
Basophils Absolute: 0 10*3/uL (ref 0.0–0.1)
Basophils Relative: 0 % (ref 0–1)
Basophils Relative: 0 % (ref 0–1)
Eosinophils Absolute: 0 10*3/uL (ref 0.0–0.7)
Eosinophils Absolute: 0 10*3/uL (ref 0.0–0.7)
Eosinophils Relative: 0 % (ref 0–5)
Eosinophils Relative: 0 % (ref 0–5)
Lymphocytes Relative: 15 % (ref 12–46)
Lymphocytes Relative: 6 % — ABNORMAL LOW (ref 12–46)
Lymphs Abs: 0.6 10*3/uL — ABNORMAL LOW (ref 0.7–4.0)
Lymphs Abs: 1.8 10*3/uL (ref 0.7–4.0)
Monocytes Absolute: 0.3 10*3/uL (ref 0.1–1.0)
Monocytes Absolute: 0.8 10*3/uL (ref 0.1–1.0)
Monocytes Relative: 3 % (ref 3–12)
Monocytes Relative: 7 % (ref 3–12)
Neutro Abs: 10.9 10*3/uL — ABNORMAL HIGH (ref 1.7–7.7)
Neutro Abs: 9.2 10*3/uL — ABNORMAL HIGH (ref 1.7–7.7)
Neutrophils Relative %: 78 % — ABNORMAL HIGH (ref 43–77)
Neutrophils Relative %: 92 % — ABNORMAL HIGH (ref 43–77)

## 2010-10-31 LAB — BASIC METABOLIC PANEL
BUN: 26 mg/dL — ABNORMAL HIGH (ref 6–23)
CO2: 25 mEq/L (ref 19–32)
Calcium: 9.2 mg/dL (ref 8.4–10.5)
Chloride: 107 mEq/L (ref 96–112)
Creatinine, Ser: 0.73 mg/dL (ref 0.4–1.2)
GFR calc Af Amer: >60 mL/min (ref >60)
GFR calc non Af Amer: >60 mL/min (ref >60)
Glucose, Bld: 112 mg/dL — ABNORMAL HIGH (ref 70–99)
Potassium: 4 mEq/L (ref 3.5–5.1)
Sodium: 139 mEq/L (ref 135–145)

## 2010-10-31 LAB — HEMOGLOBIN AND HEMATOCRIT, BLOOD
HCT: 37.3 % (ref 36.0–46.0)
HCT: 40.7 % (ref 36.0–46.0)
Hemoglobin: 12.4 g/dL (ref 12.0–15.0)
Hemoglobin: 13.7 g/dL (ref 12.0–15.0)

## 2010-10-31 LAB — MRSA PCR SCREENING: MRSA by PCR: NEGATIVE

## 2010-11-01 ENCOUNTER — Other Ambulatory Visit (HOSPITAL_COMMUNITY): Payer: Self-pay | Admitting: Surgery

## 2010-11-05 ENCOUNTER — Other Ambulatory Visit (HOSPITAL_COMMUNITY): Payer: Self-pay | Admitting: Surgery

## 2010-11-05 ENCOUNTER — Ambulatory Visit (HOSPITAL_COMMUNITY)
Admission: RE | Admit: 2010-11-05 | Discharge: 2010-11-05 | Disposition: A | Discharge status: Home / Self Care | Payer: 59 | Source: Ambulatory Visit | Attending: Surgery | Admitting: Surgery

## 2010-11-05 DIAGNOSIS — R109 Unspecified abdominal pain: Secondary | ICD-10-CM | POA: Insufficient documentation

## 2010-11-05 DIAGNOSIS — K7689 Other specified diseases of liver: Secondary | ICD-10-CM | POA: Insufficient documentation

## 2010-11-05 DIAGNOSIS — Z9884 Bariatric surgery status: Secondary | ICD-10-CM | POA: Insufficient documentation

## 2010-11-05 LAB — COMPREHENSIVE METABOLIC PANEL
ALT: 21 U/L (ref 0–35)
AST: 21 U/L (ref 0–37)
Albumin: 4 g/dL (ref 3.5–5.2)
Alkaline Phosphatase: 78 U/L (ref 39–117)
BUN: 23 mg/dL (ref 6–23)
CO2: 30 mEq/L (ref 19–32)
Calcium: 9.6 mg/dL (ref 8.4–10.5)
Chloride: 105 mEq/L (ref 96–112)
Creatinine, Ser: 0.76 mg/dL (ref 0.4–1.2)
GFR calc Af Amer: >60 mL/min (ref >60)
GFR calc non Af Amer: >60 mL/min (ref >60)
Glucose, Bld: 101 mg/dL — ABNORMAL HIGH (ref 70–99)
Potassium: 4.4 mEq/L (ref 3.5–5.1)
Sodium: 142 mEq/L (ref 135–145)
Total Bilirubin: 0.8 mg/dL (ref 0.3–1.2)
Total Protein: 7.5 g/dL (ref 6.0–8.3)

## 2010-11-05 LAB — DIFFERENTIAL
Basophils Absolute: 0 10*3/uL (ref 0.0–0.1)
Basophils Relative: 0 % (ref 0–1)
Eosinophils Absolute: 0.1 10*3/uL (ref 0.0–0.7)
Eosinophils Relative: 2 % (ref 0–5)
Lymphocytes Relative: 24 % (ref 12–46)
Lymphs Abs: 1.4 10*3/uL (ref 0.7–4.0)
Monocytes Absolute: 0.4 10*3/uL (ref 0.1–1.0)
Monocytes Relative: 7 % (ref 3–12)
Neutro Abs: 3.8 10*3/uL (ref 1.7–7.7)
Neutrophils Relative %: 67 % (ref 43–77)

## 2010-11-05 LAB — CREATININE, SERUM
Creatinine, Ser: 0.78 mg/dL (ref 0.4–1.2)
GFR calc Af Amer: >60 mL/min (ref >60)
GFR calc non Af Amer: >60 mL/min (ref >60)

## 2010-11-05 LAB — CBC
HCT: 41.8 % (ref 36.0–46.0)
Hemoglobin: 14.1 g/dL (ref 12.0–15.0)
MCHC: 33.7 g/dL (ref 30.0–36.0)
MCV: 87.8 fL (ref 78.0–100.0)
Platelets: 304 10*3/uL (ref 150–400)
RBC: 4.76 MIL/uL (ref 3.87–5.11)
RDW: 13.4 % (ref 11.5–15.5)
WBC: 5.7 10*3/uL (ref 4.0–10.5)

## 2010-11-05 MED ORDER — GADOBENATE DIMEGLUMINE 529 MG/ML IV SOLN
15.0000 mL | Freq: Once | INTRAVENOUS | Status: AC | PRN
  Administered 2010-11-05: 15 mL via INTRAVENOUS

## 2010-11-06 ENCOUNTER — Ambulatory Visit (HOSPITAL_COMMUNITY)
Admission: RE | Admit: 2010-11-06 | Discharge: 2010-11-06 | Disposition: A | Discharge status: Home / Self Care | Payer: 59 | Source: Ambulatory Visit | Attending: Surgery | Admitting: Surgery

## 2010-11-06 DIAGNOSIS — Z9884 Bariatric surgery status: Secondary | ICD-10-CM | POA: Insufficient documentation

## 2010-11-06 DIAGNOSIS — R109 Unspecified abdominal pain: Secondary | ICD-10-CM | POA: Insufficient documentation

## 2010-11-06 DIAGNOSIS — M549 Dorsalgia, unspecified: Secondary | ICD-10-CM | POA: Insufficient documentation

## 2010-11-06 DIAGNOSIS — I10 Essential (primary) hypertension: Secondary | ICD-10-CM | POA: Insufficient documentation

## 2010-11-06 DIAGNOSIS — K5900 Constipation, unspecified: Secondary | ICD-10-CM | POA: Insufficient documentation

## 2010-11-06 DIAGNOSIS — R1013 Epigastric pain: Secondary | ICD-10-CM | POA: Insufficient documentation

## 2010-11-24 NOTE — Op Note (Signed)
  NAMEMARLEEN, Kari Gibbs             ACCOUNT NO.:  [id number]  MEDICAL RECORD NO.:  [id number]           PATIENT TYPE:  O  LOCATION:  [facility]                         FACILITY:  [facility]  PHYSICIAN:  Sandria Bales. Ezzard Standing, M.D.  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  POSTOPERATIVE DIAGNOSIS:  Normal-appearing Roux-en-Y gastric bypass.  PROCEDURES:  Esophagogastrojejunoscopy.  SURGEON:  Sandria Bales. Ezzard Standing, M.D.  FIRST ASSISTANT:  None.  ANESTHESIA:  5 mg of Versed, 50 mcg of fentanyl.  COMPLICATIONS:  None.  INDICATIONS FOR PROCEDURE:  [name] is a 60 year old white female who had a laparoscopic Roux-en-Y gastric bypass by Dr. Wenda Low on November 14, 2009.  She has done well from the surgery with a weight loss of over 100 pounds and improvement in her hypertension.  However, she has been, on eating certain foods, complaining of epigastric abdominal pain, sometimes radiating to her back, with constipation.  She underwent an MRCP yesterday, which was normal, and now comes for upper endoscopy today.  The indications and potential complications of endoscopy were explained to the patient.  The main potential complication is probably perforation of the bowel.  PROCEDURE NOTE:  The patient had the back of her throat anesthetized with Cetacaine.  She then had a bite block in her mouth.  She was given 5 mg of Versed and 50 mcg of fentanyl.  A flexible Pentax endoscope was passed into back of her throat without any difficulty.  She was placed on 2 L nasal O2 and monitored with a pulse oximetry, blood pressure cuff, and EKG.  I passed the scope down into her gastric pouch and then into both the efferent and afferent jejunal limbs.  She did have a fairly long afferent limb, may be 15 cm.  I went about 15-20 cm down the  efferent limb.  Her gastrojejunal anastomosis was at 43-44 cm from the incisors. Her esophagogastric junction was at 39 cm for approximately 4-5 cm pouch, which is of normal size without ulceration or change in gastric mucosa.  Her esophagus was unremarkable.  The scope was then withdrawn, visualizing her vocal cords, which were also unremarkable.  The patient tolerated the procedure well, but there were no endoscopic findings within the jejunum, gastric pouch, or esophagus to explain her abdominal pain.   Sandria Bales. Ezzard Standing, M.D., FACS   DHN/MEDQ  D:  11/06/2010  T:  11/07/2010  Job:  [id number]  cc:   [place] Daphine Deutscher, MD [address] [place] [address]  Electronically Signed by Ovidio Kin M.D. on 11/24/2010 12:00:31 PM

## 2010-11-27 ENCOUNTER — Ambulatory Visit: Payer: Self-pay | Admitting: *Deleted

## 2010-11-27 ENCOUNTER — Encounter: Admit: 2010-11-27 | Payer: Self-pay | Admitting: Surgery

## 2010-12-06 ENCOUNTER — Encounter: Payer: 59 | Admitting: *Deleted

## 2010-12-17 ENCOUNTER — Encounter (INDEPENDENT_AMBULATORY_CARE_PROVIDER_SITE_OTHER): Payer: Self-pay | Admitting: Surgery

## 2010-12-25 NOTE — Assessment & Plan Note (Signed)
[facility]                             PULMONARY OFFICE NOTE   EBANY, BOWERMASTER                    MRN:          [id number]  DATE:04/16/2007                            DOB:          [birth year]    Ms. Nickey is a 60 year old female with a history of cyclic cough  from reflux disease, ACE inhibitor use, and allergic rhinitis without  true asthma.  She has done well since being switched to Avapro and is  off lisinopril, maintains fluticasone 2 sprays each nostril daily,  omeprazole 20 mg daily.  She is also on brompheniramine p.r.n.   PHYSICAL EXAMINATION:  Temp 98, blood pressure 120/70, pulse 70,  saturation 95% on room air.  CHEST:  Clear without evidence of wheeze or rhonchi.  CARDIAC:  Regular rate and rhythm without S3.  Normal S1 and S2.  ABDOMEN:  Soft and nontender.  EXTREMITIES:  No clubbing or edema.   IMPRESSION:  Cyclic cough from reflux disease.  Allergic rhinitis.  Previous ACE inhibitor usage.   PLAN:  Patient is to maintain on brompheniramine p.r.n.  Cycle off  omeprazole as she loses weight and follows a reflux diet and to maintain  fluticasone 2 sprays each nostril.  Return to this office in 12 months,  sooner if necessary.     Charlcie Cradle Delford Field, MD, [facility]  Electronically Signed    PEW/MedQ  DD: 04/16/2007  DT: 04/16/2007  Job #: [id number]   cc:   Dellis Anes. Idell Pickles, M.D.

## 2010-12-28 NOTE — Op Note (Signed)
Kari Gibbs, Kari Gibbs             ACCOUNT NO.:  [id number]   MEDICAL RECORD NO.:  [id number]          PATIENT TYPE:  AMB   LOCATION:  DSC                          FACILITY:  [facility]   PHYSICIAN:  Kari Fitch. Gibbs, M.D. DATE OF BIRTH:  1950-09-13   DATE OF PROCEDURE:  12/19/2005  DATE OF DISCHARGE:                                 OPERATIVE REPORT   PREOPERATIVE DIAGNOSIS:  Painful subretinacular dorsal ganglion left wrist.   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   OPERATION:  Exploration of dorsal aspect left wrist with debridement of  myxoid/mucoid cyst from dorsal scapholunate ligament.   OPERATING SURGEON:  Kari Gibbs.   ASSISTANT:  Kari Rusk PA-C.   ANESTHESIA:  General by LMA.   SUPERVISING ANESTHESIOLOGIST:  Dr. Sampson Gibbs.   INDICATIONS:  Kari Gibbs is a 60 year old woman referred by Dr. Doran Gibbs for evaluation and management of a painful left wrist.   Clinical examination revealed tenderness on the dorsal aspect of her wrist  adjacent to the scapholunate ligament with a positive Watson's maneuver.  By  palpation she appeared to have a fullness deep to the extensor retinaculum  consistent with a subpeduncular ganglion cyst.  At the time of her initial  presentation in the office we measured her mass at about 1.5 cm in diameter.  We advised her to proceed with exploration and removal of  a subpeduncular  ganglion cyst.   She was brought to the [facility] today and reexamined in the  holding area.  It was my impression that her cyst was significantly smaller  at this time.  I carefully reexamined her chart and reexamined Mr.  Gibbs's wrist.  She still had significant tenderness over the  scapholunate interosseous ligament and had considerable pain when she would  push out of a chair or push open a door with her wrist in dorsiflexion load-  bearing.  Clinically she appeared to have an intra-articular pathology  consistent with a subpeduncular cyst.  We advised proceeding with surgery at  this time.   Preoperatively she was noted to be allergic to IVP dye.  For this reason,  she will be prepped with Hibiclens and alcohol rather than Betadine.   After informed consent with Kari Gibbs and her husband, she is brought  to the operating room at this time.   PROCEDURE:  Kari Gibbs is brought to the operating room and placed  in the supine position on the operating table.  Following the induction of  general anesthesia by LMA technique, the left arm is prepped with Betadine  soap and solution and sterilely draped.  A pneumatic tourniquet was applied  to the proximal left brachium.  One gram of Ancef was administered as an IV  prophylactic antibiotic.  Following exsanguination of the left arm with an Esmarch bandage, the  arterial tourniquet was inflated to 230 mmHg.  The procedure commenced with  a transverse incision directly over the palpable fullness.  The subcutaneous  tissues were carefully divided revealing a normal-appearing extensor  retinaculum.  The retinaculum was  split directly over the mass in a  transverse manner and retracted.  The capsule was entered transversely and a  1 cm in diameter complex myxoid cyst was identified directly on the  scapholunate interosseous ligament.  This was an intra-articular and  intraligamentous cyst.   The cyst was circumferentially dissected with scissors and removed piecemeal  with a rongeur including a few fibers of the scapholunate interosseous  ligament that had a yellowish discoloration and fibular degeneration.  The  interosseous ligament with remained more than 95% intact.   The wound was carefully inspected for other masses.  The radiocarpal  articulation was explored and no other predicaments were noted.  The wound  was then irrigated, the capsule closed,  followed by repair of the skin with  intradermal 3-0 Prolene suture.  A compressive dressing was applied with a  volar plaster splint maintaining the wrist in 5 degrees of dorsiflexion.  There were no apparent complications.   For aftercare Kari Gibbs is provided a prescription for Vicodin 5 mg one  p.o. q.4-6h. p.r.n. pain, 20 tablets without refill.  Also, Keflex 500 mg  one p.o. q.8h. times three days as a prophylactic antibiotic due to joint  entry.      Kari Gibbs, M.D.  Electronically Signed     RVS/MEDQ  D:  12/19/2005  T:  12/20/2005  Job:  [id number]

## 2011-01-01 ENCOUNTER — Encounter: Payer: 59 | Attending: Surgery | Admitting: *Deleted

## 2011-01-01 DIAGNOSIS — Z09 Encounter for follow-up examination after completed treatment for conditions other than malignant neoplasm: Secondary | ICD-10-CM | POA: Insufficient documentation

## 2011-01-01 DIAGNOSIS — Z9884 Bariatric surgery status: Secondary | ICD-10-CM | POA: Insufficient documentation

## 2011-01-18 ENCOUNTER — Other Ambulatory Visit (INDEPENDENT_AMBULATORY_CARE_PROVIDER_SITE_OTHER): Payer: Self-pay | Admitting: Surgery

## 2011-01-18 ENCOUNTER — Encounter (HOSPITAL_COMMUNITY): Payer: 59

## 2011-01-18 LAB — BASIC METABOLIC PANEL
BUN: 18 mg/dL (ref 6–23)
CO2: 28 mEq/L (ref 19–32)
Calcium: 9.9 mg/dL (ref 8.4–10.5)
Chloride: 102 mEq/L (ref 96–112)
Creatinine, Ser: 0.7 mg/dL (ref 0.4–1.2)
GFR calc Af Amer: >60 mL/min (ref >60)
GFR calc non Af Amer: >60 mL/min (ref >60)
Glucose, Bld: 86 mg/dL (ref 70–99)
Potassium: 4.5 mEq/L (ref 3.5–5.1)
Sodium: 138 mEq/L (ref 135–145)

## 2011-01-18 LAB — CBC
HCT: 38.4 % (ref 36.0–46.0)
Hemoglobin: 13 g/dL (ref 12.0–15.0)
MCH: 30.1 pg (ref 26.0–34.0)
MCHC: 33.9 g/dL (ref 30.0–36.0)
MCV: 88.9 fL (ref 78.0–100.0)
Platelets: 251 10*3/uL (ref 150–400)
RBC: 4.32 MIL/uL (ref 3.87–5.11)
RDW: 12.9 % (ref 11.5–15.5)
WBC: 5.3 10*3/uL (ref 4.0–10.5)

## 2011-01-18 LAB — SURGICAL PCR SCREEN
MRSA, PCR: NEGATIVE
Staphylococcus aureus: NEGATIVE

## 2011-01-21 ENCOUNTER — Ambulatory Visit (HOSPITAL_COMMUNITY)
Admission: RE | Admit: 2011-01-21 | Discharge: 2011-01-22 | Disposition: A | Discharge status: Home / Self Care | Payer: 59 | Source: Ambulatory Visit | Attending: Surgery | Admitting: Surgery

## 2011-01-21 DIAGNOSIS — Z79899 Other long term (current) drug therapy: Secondary | ICD-10-CM | POA: Insufficient documentation

## 2011-01-21 DIAGNOSIS — I10 Essential (primary) hypertension: Secondary | ICD-10-CM | POA: Insufficient documentation

## 2011-01-21 DIAGNOSIS — K660 Peritoneal adhesions (postprocedural) (postinfection): Secondary | ICD-10-CM | POA: Insufficient documentation

## 2011-01-21 DIAGNOSIS — Z9884 Bariatric surgery status: Secondary | ICD-10-CM | POA: Insufficient documentation

## 2011-01-21 DIAGNOSIS — R109 Unspecified abdominal pain: Secondary | ICD-10-CM | POA: Insufficient documentation

## 2011-01-21 DIAGNOSIS — Z01818 Encounter for other preprocedural examination: Secondary | ICD-10-CM | POA: Insufficient documentation

## 2011-01-21 DIAGNOSIS — K439 Ventral hernia without obstruction or gangrene: Secondary | ICD-10-CM | POA: Insufficient documentation

## 2011-01-22 LAB — CBC
HCT: 31.9 % — ABNORMAL LOW (ref 36.0–46.0)
Hemoglobin: 10.7 g/dL — ABNORMAL LOW (ref 12.0–15.0)
MCH: 30.1 pg (ref 26.0–34.0)
MCHC: 33.5 g/dL (ref 30.0–36.0)
MCV: 89.9 fL (ref 78.0–100.0)
Platelets: 173 10*3/uL (ref 150–400)
RBC: 3.55 MIL/uL — ABNORMAL LOW (ref 3.87–5.11)
RDW: 13.1 % (ref 11.5–15.5)
WBC: 5.9 10*3/uL (ref 4.0–10.5)

## 2011-01-23 NOTE — Op Note (Signed)
  NAMEGERRI, Kari Gibbs             ACCOUNT NO.:  [id number]  MEDICAL RECORD NO.:  [id number]  LOCATION:  1527                         FACILITY:  [facility]  PHYSICIAN:  Thornton Park. Daphine Deutscher, MD  DATE OF BIRTH:  1950/12/24  DATE OF PROCEDURE: DATE OF DISCHARGE:                              OPERATIVE REPORT   PROCEDURE:  Laparoscopy, takedown of acute adhesions to anterior abdominal wall involving the Roux limb, repair of small ventral hernia and left upper quadrant trocar incision.  SURGEON:  Thornton Park. Daphine Deutscher, MD  ASSISTANT:  Lorne Skeens. Hoxworth, M.D.  ANESTHESIA:  General endotracheal.  DESCRIPTION OF PROCEDURE:  The patient was taken to room #1 on Monday, January 21, 2011, given general anesthesia.  I first opened the old incision, left upper quadrant incision, and used a 5-mm 0-degree Optiview to enter the abdomen without difficulty.  Abdomen was insufflated and a general survey briefly showed this acute angulation of the efferent limb of the Roux along with the candy cane stuck up to the what  appeared to be portion the falciform ligament.  Before addressing that, however, I went to the right lower quadrant, identified the ileocecal valve and ran the bowel all the way back to the JJ which in there appeared to be no evidence of any obstruction at all.  Everything looked good and so we then went up with 2 other trocars (total 3), I used careful sharp dissection to take down the adhesions of the anterior wall and separated the candy cane from the Roux limb efferent limb.  The candy cane was a little long but in our experience that has not been any problem with any pain.  However where it was twisted in a way was so adherent to the anterior abdominal wall, it appeared that it could kink at times when there is a burden of food or gas  placed in the proximal bowel.  Once this was taken down, I then repaired the left upper quadrant with 0 Vicryl using the Endoclose device.  I injected  the three incisions with Exparel, closed with 4-0 Vicryl and with Dermabond.  The patient was awakened and taken to recovery room in satisfactory condition.     Thornton Park Daphine Deutscher, MD     MBM/MEDQ  D:  01/21/2011  T:  01/21/2011  Job:  [id number]  cc:   Pam Drown, M.D. Fax: [phone]  Electronically Signed by Luretha Murphy MD on 01/23/2011 04:33:51 PM

## 2011-02-20 ENCOUNTER — Encounter (INDEPENDENT_AMBULATORY_CARE_PROVIDER_SITE_OTHER): Payer: Self-pay | Admitting: General Surgery

## 2011-02-21 ENCOUNTER — Encounter (INDEPENDENT_AMBULATORY_CARE_PROVIDER_SITE_OTHER): Payer: Self-pay | Admitting: Surgery

## 2011-02-21 ENCOUNTER — Ambulatory Visit (INDEPENDENT_AMBULATORY_CARE_PROVIDER_SITE_OTHER): Payer: 59 | Admitting: Surgery

## 2011-02-21 VITALS — BP 122/78 | Ht 66.0 in | Wt 136.0 lb

## 2011-02-21 DIAGNOSIS — Z9889 Other specified postprocedural states: Secondary | ICD-10-CM

## 2011-02-21 NOTE — Progress Notes (Signed)
Return today weight is down to 136.6. Incisions are healing nicely. The original nagging midepigastric pain has resolved with a laparoscopy and generalized this. She did have remarkable adhesions between her ruling him in the intra-abdominal wall. She is however having episodes of a throbbing type pain occurs between her xiphoid and umbilicus. This remains administratively. It seems it is made better with the narcotic which relaxed the bowel. We will continue to follow this symptom. The meantime we've been short for her to expand her diet and try to increase her caloric intake as we want her white plateau were for her to gain weight. I will see her again in 2 months for followup.

## 2011-03-21 ENCOUNTER — Telehealth (INDEPENDENT_AMBULATORY_CARE_PROVIDER_SITE_OTHER): Payer: Self-pay | Admitting: General Surgery

## 2011-03-21 DIAGNOSIS — G8918 Other acute postprocedural pain: Secondary | ICD-10-CM

## 2011-03-21 MED ORDER — OXYCODONE-ACETAMINOPHEN 5-325 MG PO TABS: 1.0000 | ORAL_TABLET | ORAL | Status: DC | PRN

## 2011-03-21 NOTE — Telephone Encounter (Signed)
Pt called wanting pain medication refilled..I sent req to Dr.Martin which is pending...let8/9/12

## 2011-03-22 ENCOUNTER — Other Ambulatory Visit (INDEPENDENT_AMBULATORY_CARE_PROVIDER_SITE_OTHER): Payer: Self-pay | Admitting: General Surgery

## 2011-03-22 ENCOUNTER — Telehealth (INDEPENDENT_AMBULATORY_CARE_PROVIDER_SITE_OTHER): Payer: Self-pay | Admitting: Surgery

## 2011-03-22 DIAGNOSIS — G8918 Other acute postprocedural pain: Secondary | ICD-10-CM

## 2011-03-22 MED ORDER — OXYCODONE-ACETAMINOPHEN 5-325 MG PO TABS: 1.0000 | ORAL_TABLET | ORAL | Status: DC | PRN

## 2011-03-25 ENCOUNTER — Telehealth (INDEPENDENT_AMBULATORY_CARE_PROVIDER_SITE_OTHER): Payer: Self-pay | Admitting: General Surgery

## 2011-03-25 NOTE — Telephone Encounter (Signed)
Pt requested appt, first available 04/24/11 at 3:10. Left a message if we should have a cancellation I will call her.

## 2011-03-29 ENCOUNTER — Other Ambulatory Visit: Payer: Self-pay | Admitting: Family Medicine

## 2011-03-29 ENCOUNTER — Ambulatory Visit
Admission: RE | Admit: 2011-03-29 | Discharge: 2011-03-29 | Disposition: A | Discharge status: Home / Self Care | Payer: 59 | Source: Ambulatory Visit | Attending: Family Medicine | Admitting: Family Medicine

## 2011-03-29 DIAGNOSIS — R112 Nausea with vomiting, unspecified: Secondary | ICD-10-CM

## 2011-03-29 DIAGNOSIS — M545 Low back pain, unspecified: Secondary | ICD-10-CM

## 2011-03-29 DIAGNOSIS — R1032 Left lower quadrant pain: Secondary | ICD-10-CM

## 2011-04-12 ENCOUNTER — Other Ambulatory Visit: Payer: Self-pay | Admitting: Family Medicine

## 2011-04-24 ENCOUNTER — Ambulatory Visit (INDEPENDENT_AMBULATORY_CARE_PROVIDER_SITE_OTHER): Payer: 59 | Admitting: Surgery

## 2011-04-24 ENCOUNTER — Encounter (INDEPENDENT_AMBULATORY_CARE_PROVIDER_SITE_OTHER): Payer: Self-pay | Admitting: Surgery

## 2011-04-24 VITALS — BP 152/94 | HR 68 | Temp 99.0°F | Ht 66.0 in | Wt 136.6 lb

## 2011-04-24 DIAGNOSIS — Z9884 Bariatric surgery status: Secondary | ICD-10-CM

## 2011-04-24 NOTE — Progress Notes (Signed)
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  She has lost 96% of her excess weight.  Today's BMI is 22 and her weight is 136.  She feels like she is turning the corner is having less of the cramping upper abdominal pains. Told her to try some peppermint see if that would leave the spasm. I did give her a refill on Percocet 5/325 to have as needed for pain although she says she hasn't had any in about 2 weeks.  She looks good having lost more weight and she currently weighs. Him to see her back in about 6 months and certainly sooner if she has any problems. She recently did pass a kidney stone and Selena Batten has been looking after her for that.  Plan return 6 months

## 2011-06-14 ENCOUNTER — Other Ambulatory Visit (INDEPENDENT_AMBULATORY_CARE_PROVIDER_SITE_OTHER): Payer: Self-pay | Admitting: General Surgery

## 2011-06-14 ENCOUNTER — Telehealth (INDEPENDENT_AMBULATORY_CARE_PROVIDER_SITE_OTHER): Payer: Self-pay

## 2011-06-14 DIAGNOSIS — G8918 Other acute postprocedural pain: Secondary | ICD-10-CM

## 2011-06-14 MED ORDER — OXYCODONE-ACETAMINOPHEN 5-325 MG PO TABS: 1.0000 | ORAL_TABLET | ORAL | Status: DC | PRN

## 2011-06-14 NOTE — Telephone Encounter (Signed)
Pt calling NW:GNFAOZ of oxycodone. Pt states she left earlier msg. I reviewed this with French Ana. She has chart and will review it with Dr Daphine Deutscher. Pt advised that Dr Daphine Deutscher has request and French Ana will call pt later today with MD response.

## 2011-07-01 ENCOUNTER — Telehealth (INDEPENDENT_AMBULATORY_CARE_PROVIDER_SITE_OTHER): Payer: Self-pay

## 2011-07-01 NOTE — Telephone Encounter (Signed)
Kari Gibbs called requesting an earlier appointment with Dr.Martin also, she has some questions and concerns that she would like to discuss and would like a call back.

## 2011-07-02 ENCOUNTER — Telehealth (INDEPENDENT_AMBULATORY_CARE_PROVIDER_SITE_OTHER): Payer: Self-pay | Admitting: General Surgery

## 2011-07-29 IMAGING — CR DG CHEST 2V
2 series · 2 of 2 positions shown · non-contrast
Comparison: 06/06/2009

CLINICAL DATA: Chest pain/short of breath

CHEST - 2 VIEW

[w chest pa]
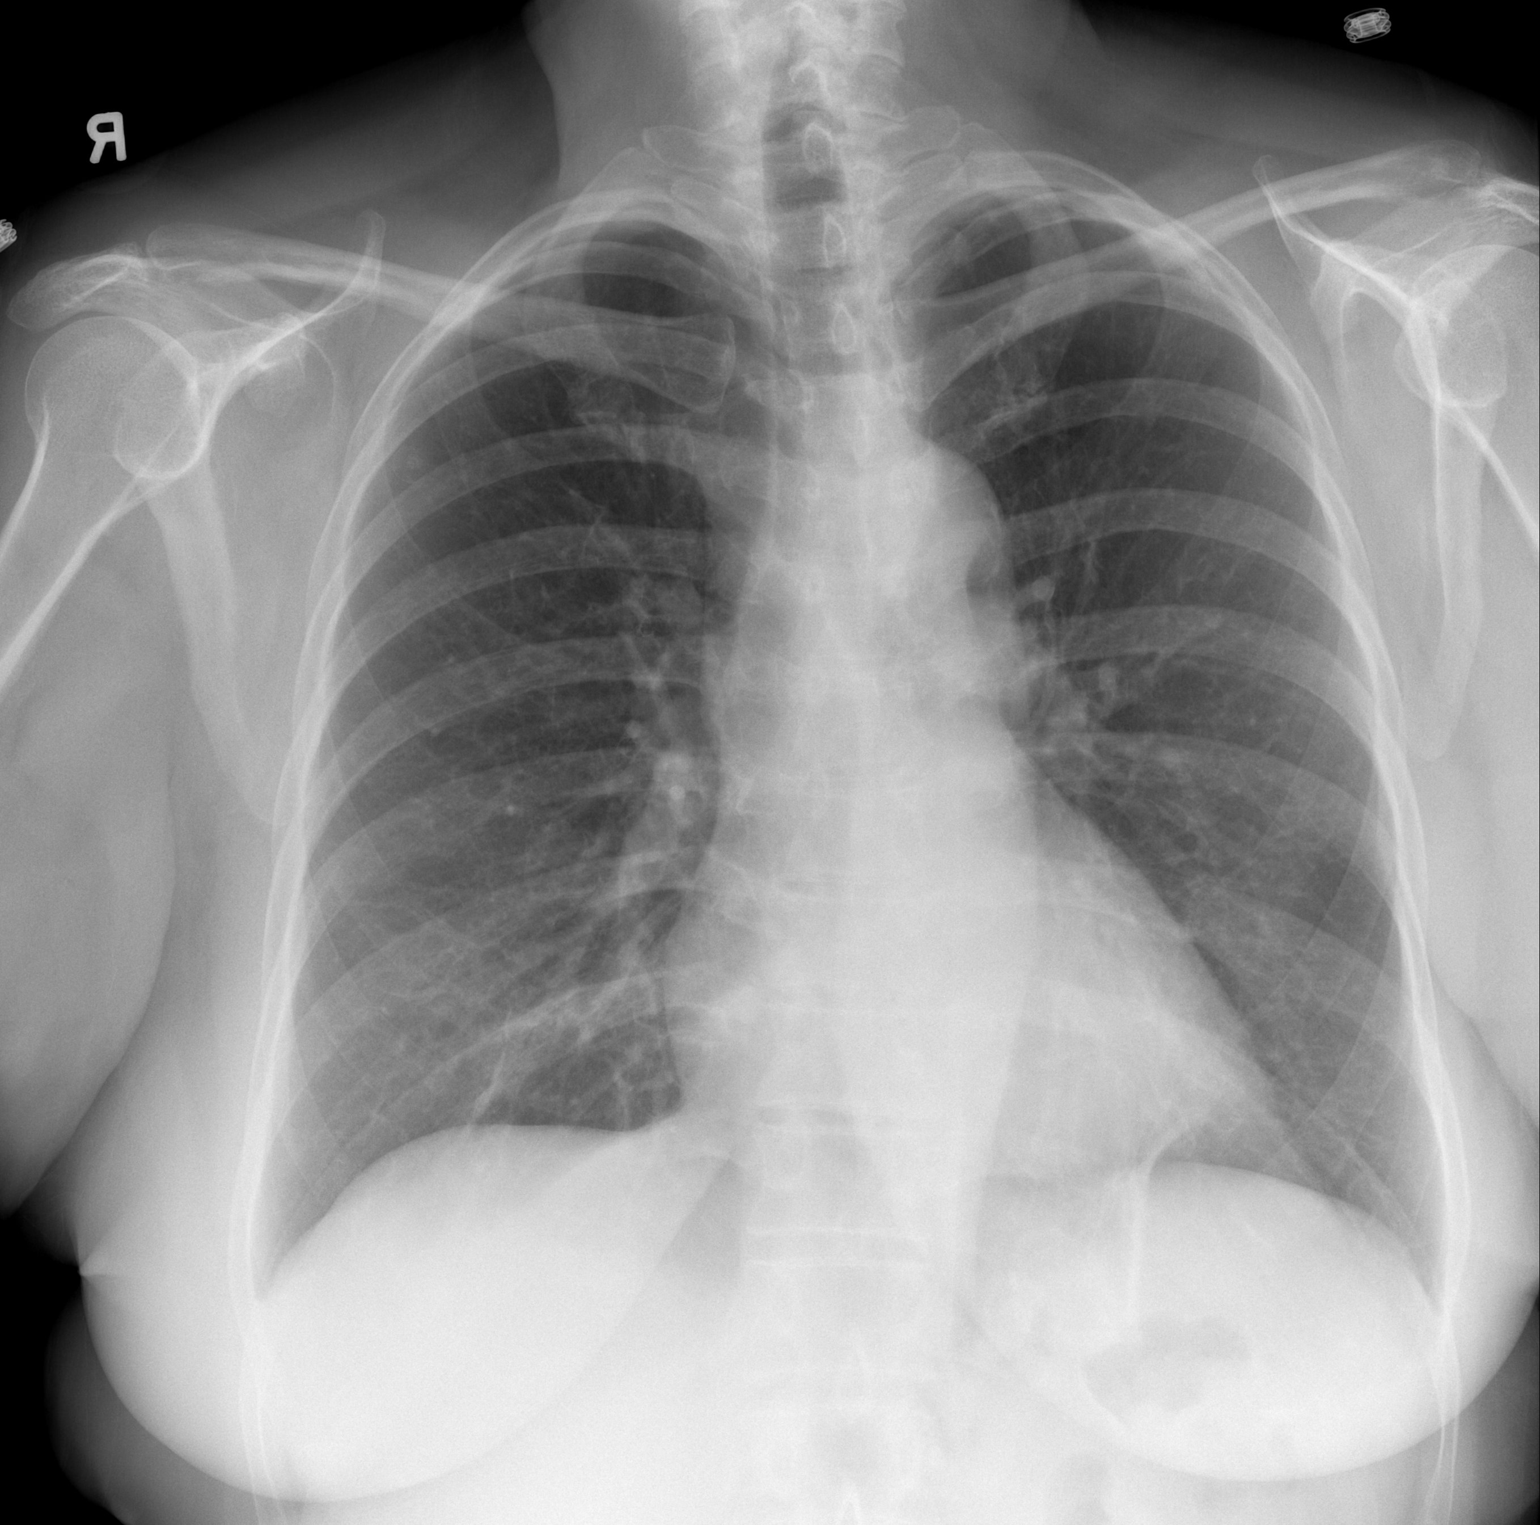

[w chest lat]
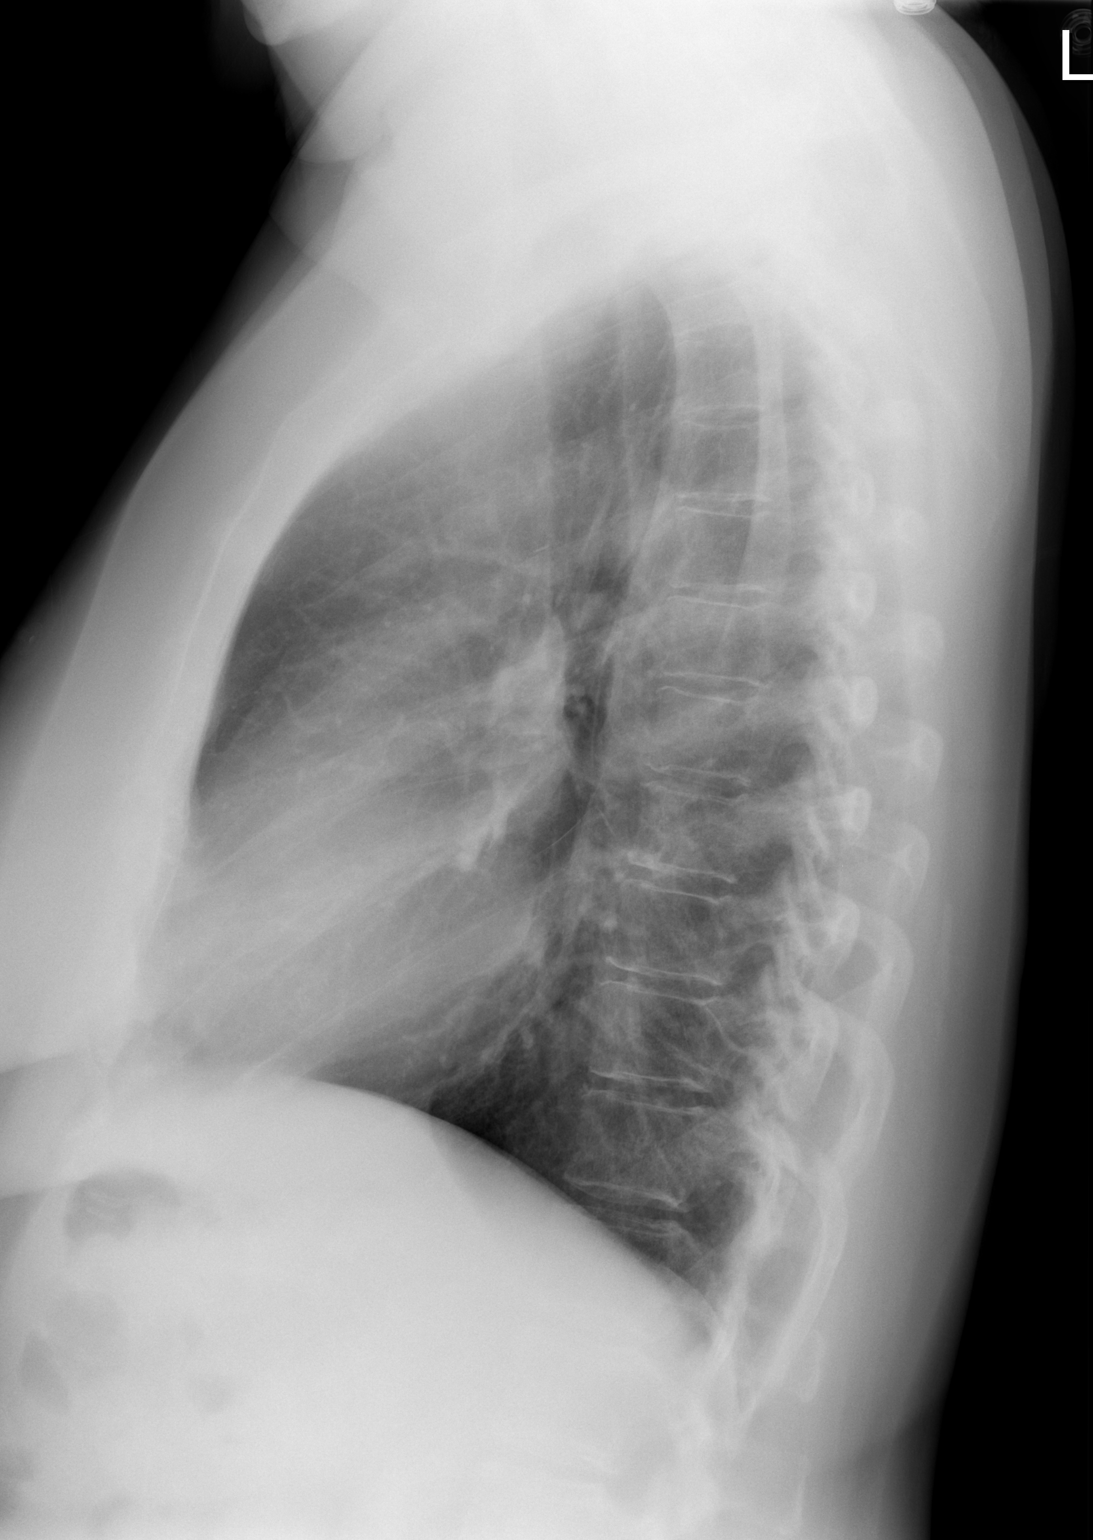

[2 of 2 positions shown; findings below may reference images not displayed]

FINDINGS: Heart and mediastinal contours normal.  Lungs essentially
clear.  There is some minimal vertically oriented plate-like
atelectasis or scar at the left base.  This was probably present
previously but is now more visible.  It is unlikely to be
clinically significant.
IMPRESSION: Minimal plate-like atelectasis or scarring at the left base - no
active disease.

## 2011-08-02 ENCOUNTER — Ambulatory Visit (INDEPENDENT_AMBULATORY_CARE_PROVIDER_SITE_OTHER): Payer: 59 | Admitting: Surgery

## 2011-08-02 ENCOUNTER — Encounter (INDEPENDENT_AMBULATORY_CARE_PROVIDER_SITE_OTHER): Payer: Self-pay | Admitting: Surgery

## 2011-08-02 VITALS — BP 144/94 | HR 72 | Temp 97.1°F | Resp 12 | Ht 66.0 in | Wt 128.2 lb

## 2011-08-02 DIAGNOSIS — Z9884 Bariatric surgery status: Secondary | ICD-10-CM

## 2011-08-02 MED ORDER — OXYCODONE-ACETAMINOPHEN 5-325 MG PO TABS
1.0000 | ORAL_TABLET | Freq: Four times a day (QID) | ORAL | Status: DC | PRN
Start: 2011-08-02 — End: 2011-09-05

## 2011-08-02 NOTE — Progress Notes (Signed)
Roselind and her husband came in today and she continues to have pain in her epigastric region after eating certain things. Her reaction to these very depending on the timing. She says better than it used to be but still is 20 her. She's been without anything for pain for the last 10 days and his starting to enjoy her. I offered to take her back to the operating room for laparoscopic intervention with probable more enteral lysis and possibly with resection of her portion of her roux limb.  There about to leave to go account for 2 weeks. I want to wait until sometime in January we can revisit this. She requested to recovery fill of her Percocet. I gave her a prescription for 40 pills, 5/325 Percocet.  Return in 6 weeks and discuss further surgery.

## 2011-08-02 NOTE — Patient Instructions (Signed)
Try oil of peppermint or ginger root for nausea Take pain med as needed

## 2011-08-13 DIAGNOSIS — K56609 Unspecified intestinal obstruction, unspecified as to partial versus complete obstruction: Secondary | ICD-10-CM

## 2011-08-13 HISTORY — DX: Unspecified intestinal obstruction, unspecified as to partial versus complete obstruction: K56.609

## 2011-09-03 ENCOUNTER — Telehealth (INDEPENDENT_AMBULATORY_CARE_PROVIDER_SITE_OTHER): Payer: Self-pay | Admitting: General Surgery

## 2011-09-03 NOTE — Telephone Encounter (Signed)
The patient is requesting a refill on her oxycodone

## 2011-09-05 ENCOUNTER — Other Ambulatory Visit (INDEPENDENT_AMBULATORY_CARE_PROVIDER_SITE_OTHER): Payer: Self-pay | Admitting: General Surgery

## 2011-09-05 DIAGNOSIS — G8918 Other acute postprocedural pain: Secondary | ICD-10-CM

## 2011-09-05 NOTE — Telephone Encounter (Signed)
The patient contacted the office and requested a refill on her Percocet. Notified the patient  per Dr Daphine Deutscher okay to refill Pt to pick up rx tomorrow.

## 2011-09-10 MED ORDER — OXYCODONE-ACETAMINOPHEN 5-325 MG PO TABS: 1.0000 | ORAL_TABLET | ORAL | Status: DC | PRN

## 2011-09-13 ENCOUNTER — Telehealth (INDEPENDENT_AMBULATORY_CARE_PROVIDER_SITE_OTHER): Payer: Self-pay | Admitting: General Surgery

## 2011-09-13 DIAGNOSIS — R52 Pain, unspecified: Secondary | ICD-10-CM

## 2011-09-13 MED ORDER — HYDROCODONE-ACETAMINOPHEN 5-325MG PREPACK (~~LOC~~: 1.0000 | ORAL_TABLET | Freq: Four times a day (QID) | ORAL | Status: DC

## 2011-09-13 NOTE — Telephone Encounter (Signed)
The patient contacted the office her percocet rx was never printed, ok per Dr Daphine Deutscher to call out hydrocodone 5/325 1q4-6hprn pain #30 no refills. [phone]. Notified the patient

## 2011-09-17 NOTE — Telephone Encounter (Signed)
error 

## 2011-09-30 ENCOUNTER — Telehealth (INDEPENDENT_AMBULATORY_CARE_PROVIDER_SITE_OTHER): Payer: Self-pay | Admitting: General Surgery

## 2011-09-30 NOTE — Telephone Encounter (Signed)
The patient left a message on VM requesting a refill on her Percocet, called the patient back and advised her I would send a note to the Dr and get back in touch with her when I hear something.

## 2011-10-02 ENCOUNTER — Telehealth (INDEPENDENT_AMBULATORY_CARE_PROVIDER_SITE_OTHER): Payer: Self-pay | Admitting: General Surgery

## 2011-10-02 ENCOUNTER — Other Ambulatory Visit (INDEPENDENT_AMBULATORY_CARE_PROVIDER_SITE_OTHER): Payer: Self-pay | Admitting: General Surgery

## 2011-10-02 DIAGNOSIS — R52 Pain, unspecified: Secondary | ICD-10-CM

## 2011-10-02 MED ORDER — OXYCODONE-ACETAMINOPHEN 5-325 MG PO TABS: 1.0000 | ORAL_TABLET | Freq: Four times a day (QID) | ORAL | Status: DC | PRN

## 2011-10-02 NOTE — Telephone Encounter (Signed)
Notified that her rx for roxicet 5/325 1g6hprn pain #40 no refills is at the front desk

## 2011-10-09 ENCOUNTER — Telehealth (INDEPENDENT_AMBULATORY_CARE_PROVIDER_SITE_OTHER): Payer: Self-pay | Admitting: General Surgery

## 2011-10-09 NOTE — Telephone Encounter (Signed)
Patient left a voicemail requesting phenergan for nausea. Request sent to Dr Daphine Deutscher

## 2011-11-05 ENCOUNTER — Telehealth (INDEPENDENT_AMBULATORY_CARE_PROVIDER_SITE_OTHER): Payer: Self-pay | Admitting: General Surgery

## 2011-11-05 NOTE — Telephone Encounter (Signed)
The patient left a voicemail requesting a refill on Percocet.

## 2011-11-07 ENCOUNTER — Telehealth (INDEPENDENT_AMBULATORY_CARE_PROVIDER_SITE_OTHER): Payer: Self-pay | Admitting: General Surgery

## 2011-11-07 NOTE — Telephone Encounter (Signed)
Pt calling to request pain meds be called in to [facility] in [place], [place], at [phone].  She reports that she has left messages for Dr. Daphine Deutscher and Gaynell Face.  There is nothing in the IN BASKET for her at this time.  I advised pt of this and if she needs prescription pain meds she will need to see a physician at a local Urgent Care or ER.  She states she understands.

## 2011-11-13 ENCOUNTER — Other Ambulatory Visit: Payer: Self-pay | Admitting: Family Medicine

## 2011-11-13 ENCOUNTER — Ambulatory Visit
Admission: RE | Admit: 2011-11-13 | Discharge: 2011-11-13 | Disposition: A | Discharge status: Home / Self Care | Payer: 59 | Source: Ambulatory Visit | Attending: Family Medicine | Admitting: Family Medicine

## 2011-11-22 ENCOUNTER — Ambulatory Visit (INDEPENDENT_AMBULATORY_CARE_PROVIDER_SITE_OTHER): Payer: 59 | Admitting: Surgery

## 2011-11-22 ENCOUNTER — Encounter (INDEPENDENT_AMBULATORY_CARE_PROVIDER_SITE_OTHER): Payer: Self-pay | Admitting: Surgery

## 2011-11-22 VITALS — BP 148/84 | HR 72 | Resp 18 | Ht 66.0 in | Wt 129.1 lb

## 2011-11-22 DIAGNOSIS — Z9884 Bariatric surgery status: Secondary | ICD-10-CM

## 2011-11-22 NOTE — Progress Notes (Signed)
Kari Gibbs and her husband came in today for a visit.  She is currently trying to pass a kidney stone has been doing this for over a week. She's allow pain on the right which is related to that. We talked about her pain medication  And watching for addiction.  She will be going to [place] in May for her son's wedding.. I would wrote her a  prescription for Percocet 5 325 #30. She'll let me know when she gets over the kidney stone. I'll see her back after that.

## 2012-01-10 ENCOUNTER — Telehealth (INDEPENDENT_AMBULATORY_CARE_PROVIDER_SITE_OTHER): Payer: Self-pay | Admitting: General Surgery

## 2012-01-10 NOTE — Telephone Encounter (Signed)
Patient called in stating she has been seen multiple times with problems post RNY gastric bypass. She had gone out of town and was given oxycodone and is almost out. Stated before she was always given hydrocodone and that works fine. She states this seems to "calm down the pain and lets food go down." She states pain/nausea a small amount worse since the last time she was seen. She is able to eat and drink but the things she can eat are getting more and more limited. States sometimes she feels like things are plugged up and nothing will go down just after drinking liquids. She has had two episodes of vomiting in the last two weeks. She states the pain is in her chest where the esophagus connects with stomach and feels a "throbbing" in that area a lot. Patient would like an appt soon to speak with Dr Daphine Deutscher about all this and would like a refill of hydrocodone. Please advise.

## 2012-01-15 ENCOUNTER — Encounter (INDEPENDENT_AMBULATORY_CARE_PROVIDER_SITE_OTHER): Payer: Self-pay | Admitting: Surgery

## 2012-01-15 ENCOUNTER — Ambulatory Visit (INDEPENDENT_AMBULATORY_CARE_PROVIDER_SITE_OTHER): Payer: 59 | Admitting: Surgery

## 2012-01-15 VITALS — BP 98/62 | HR 76 | Temp 98.5°F | Resp 18 | Ht 66.0 in | Wt 126.1 lb

## 2012-01-15 DIAGNOSIS — Z9884 Bariatric surgery status: Secondary | ICD-10-CM

## 2012-01-15 DIAGNOSIS — R109 Unspecified abdominal pain: Secondary | ICD-10-CM

## 2012-01-15 NOTE — Progress Notes (Signed)
Chief Complaint:  persistant postprandial epigastric pain 2 years after gastric bypass  History of Present Illness:  Kari Gibbs is an 61 y.o. female who is 2 years out from a lap gastric bypass and 1 year out from laparoscopy and enterolysis of her roux limb.  She describes a fluttering epigastric sensation when this is hurting.  She and her husband came in today to see if there is anything that can be done.    I think that we should perform laparoscopy and endoscopy and probably resect her "candy cane".  Will schedule her procedure.    Past Medical History  Diagnosis Date  . Chills   . Fatigue   . Bronchitis   . Nausea & vomiting   . IBS (irritable bowel syndrome)   . Reflux   . Abdominal pain   . Arthritis   . Sore throat   . Pneumonia     Past Surgical History  Procedure Date  . Gastric bypass   . Exploratory laparotomy     Current Outpatient Prescriptions  Medication Sig Dispense Refill  . calcium-vitamin D (CALCIUM 500/D) 500-200 MG-UNIT per tablet Take 1 tablet by mouth 3 (three) times daily.        . fluticasone (FLONASE) 50 MCG/ACT nasal spray 1 spray by Nasal route 2 (two) times daily as needed. 1 spray in each nostril two times a day as needed       . fluticasone (FLONASE) 50 MCG/ACT nasal spray 1 spray by Nasal route every morning.        Marland Kitchen HYDROcodone-acetaminophen (VICODIN) 5-325 mg TABS Take 6 tablets by mouth every 6 (six) hours.  30 tablet  0  . irbesartan (AVAPRO) 300 MG tablet Take 300 mg by mouth daily.        Marland Kitchen LORazepam (ATIVAN) 0.5 MG tablet daily.      . Melatonin 5 MG TABS Take 1 tablet by mouth at bedtime as needed.        . Multiple Vitamin (MULTIVITAMIN) tablet Take 1 tablet by mouth daily.        . Omeprazole ([facility] OMEPRAZOLE) 20 MG TBEC Take 1 tablet by mouth daily.        Marland Kitchen oxyCODONE-acetaminophen (ROXICET) 5-325 MG per tablet Take 1 tablet by mouth every 6 (six) hours as needed for pain.  40 tablet  0  . PARoxetine (PAXIL) 30 MG tablet Take  30 mg by mouth daily.        . Probiotic Product (ALIGN) 4 MG CAPS Take 1 capsule by mouth at bedtime. Per Pt.       . Tamsulosin HCl (FLOMAX) 0.4 MG CAPS Ad lib.      Marland Kitchen telmisartan (MICARDIS) 40 MG tablet Take 40 mg by mouth daily. Per PCP       . vitamin B-12 (CYANOCOBALAMIN) 500 MCG tablet Take 500 mcg by mouth daily.        . Vitamin D, Ergocalciferol, (DRISDOL) 50000 UNITS CAPS Once a week.       Iohexol; Ace inhibitors; and Losartan potassium No family history on file. Social History:   reports that she has never smoked. She does not have any smokeless tobacco history on file. She reports that she does not drink alcohol or use illicit drugs.   REVIEW OF SYSTEMS - PERTINENT POSITIVES ONLY: neg  Physical Exam:   Blood pressure 98/62, pulse 76, temperature 98.5 F (36.9 C), temperature source Temporal, resp. rate 18, height 5\' 6"  (1.676 m), weight 126 lb  2 oz (57.21 kg). Body mass index is 20.36 kg/(m^2).  Gen:  WDWN white female NAD  Neurological: Alert and oriented to person, place, and time. Motor and sensory function is grossly intact  Head: Normocephalic and atraumatic.  Eyes: Conjunctivae are normal. Pupils are equal, round, and reactive to light. No scleral icterus.  Neck: Normal range of motion. Neck supple. No tracheal deviation or thyromegaly present.  Cardiovascular:  SR without murmurs or gallops.  No carotid bruits Respiratory: Effort normal.  No respiratory distress. No chest wall tenderness. Breath sounds normal.  No wheezes, rales or rhonchi.  Abdomen:  nontender at present GU: Musculoskeletal: Normal range of motion. Extremities are nontender. No cyanosis, edema or clubbing noted Lymphadenopathy: No cervical, preauricular, postauricular or axillary adenopathy is present Skin: Skin is warm and dry. No rash noted. No diaphoresis. No erythema. No pallor. Pscyh: Normal mood and affect. Behavior is normal. Judgment and thought content normal.   LABORATORY RESULTS: No  results found for this or any previous visit (from the past 48 hour(s)).  RADIOLOGY RESULTS: No results found.  Problem List: Patient Active Problem List  Diagnoses  . CLOSTRIDIUM DIFFICILE COLITIS  . DEPRESSION  . HYPERTENSION  . ALLERGIC RHINITIS  . GERD  . OSTEOARTHRITIS  . 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  Plan to resect "candy cane" off roux limb    Matt B. Daphine Deutscher, MD, [facility] [phone] beeper [phone]  01/15/2012 12:02 PM

## 2012-01-15 NOTE — Patient Instructions (Signed)
Will proceed with laparoscopy and possible small bowel resection of the end of the Roux limb.

## 2012-02-03 ENCOUNTER — Other Ambulatory Visit (INDEPENDENT_AMBULATORY_CARE_PROVIDER_SITE_OTHER): Payer: Self-pay | Admitting: General Surgery

## 2012-02-03 ENCOUNTER — Telehealth (INDEPENDENT_AMBULATORY_CARE_PROVIDER_SITE_OTHER): Payer: Self-pay | Admitting: General Surgery

## 2012-02-03 DIAGNOSIS — R109 Unspecified abdominal pain: Secondary | ICD-10-CM

## 2012-02-03 MED ORDER — OXYCODONE-ACETAMINOPHEN 5-325 MG PO TABS: 1.0000 | ORAL_TABLET | Freq: Four times a day (QID) | ORAL | Status: DC | PRN

## 2012-02-03 NOTE — Telephone Encounter (Signed)
Notified the patient she can pick up rx for percocet in the am

## 2012-02-03 NOTE — Telephone Encounter (Signed)
Pt is scheduled for surgery in about 1 month.  She reports 75-80% of the time she has epigastric pain after eating.  She states Dr. Daphine Deutscher is aware of this.  Almost out of pain meds and is requesting more, please

## 2012-02-20 ENCOUNTER — Telehealth (INDEPENDENT_AMBULATORY_CARE_PROVIDER_SITE_OTHER): Payer: Self-pay | Admitting: General Surgery

## 2012-02-20 NOTE — Telephone Encounter (Signed)
Pt calling enough ahead to not run out of Percocet.  She is taking them to reach her surgery date to revise her RNY.  Understands Dr. Daphine Deutscher is off this afternoon, so will probably not call her back until tomorrow to come pick up Rx.

## 2012-02-24 ENCOUNTER — Telehealth (INDEPENDENT_AMBULATORY_CARE_PROVIDER_SITE_OTHER): Payer: Self-pay | Admitting: General Surgery

## 2012-02-24 ENCOUNTER — Encounter (HOSPITAL_COMMUNITY): Payer: Self-pay | Admitting: Pharmacy Technician

## 2012-02-24 NOTE — Telephone Encounter (Signed)
Patient calling to request refill of percocet. Dr Daphine Deutscher paged. Surgery scheduled 03/04/12.

## 2012-02-24 NOTE — Pre-Procedure Instructions (Signed)
DR Daphine Deutscher--- Maralyn Sago has pre surgical testing appointment 02/26/12 and we need orders please  Thanks

## 2012-02-25 ENCOUNTER — Telehealth (INDEPENDENT_AMBULATORY_CARE_PROVIDER_SITE_OTHER): Payer: Self-pay | Admitting: General Surgery

## 2012-02-25 NOTE — Pre-Procedure Instructions (Signed)
DR Daphine Deutscher-   PLEASE PUT IN PRE OP ORDERS -- HAS APPT PST 02/26/12 0830 Thanks

## 2012-02-25 NOTE — Telephone Encounter (Signed)
Called pt to pick up Rx for Percocet 5/325 mg,  # 30, 1-2 po Q 4-6H prn, no refill.  Script signed by Dr. Donell Beers.

## 2012-02-26 ENCOUNTER — Encounter (HOSPITAL_COMMUNITY): Payer: Self-pay

## 2012-02-26 ENCOUNTER — Ambulatory Visit (HOSPITAL_COMMUNITY)
Admission: RE | Admit: 2012-02-26 | Discharge: 2012-02-26 | Disposition: A | Discharge status: Home / Self Care | Payer: 59 | Source: Ambulatory Visit | Attending: Surgery | Admitting: Surgery

## 2012-02-26 ENCOUNTER — Encounter (HOSPITAL_COMMUNITY)
Admission: RE | Admit: 2012-02-26 | Discharge: 2012-02-26 | Disposition: A | Discharge status: Home / Self Care | Payer: 59 | Source: Ambulatory Visit | Attending: Surgery | Admitting: Surgery

## 2012-02-26 DIAGNOSIS — Z01818 Encounter for other preprocedural examination: Secondary | ICD-10-CM | POA: Insufficient documentation

## 2012-02-26 DIAGNOSIS — Z01812 Encounter for preprocedural laboratory examination: Secondary | ICD-10-CM | POA: Insufficient documentation

## 2012-02-26 DIAGNOSIS — Z9884 Bariatric surgery status: Secondary | ICD-10-CM | POA: Insufficient documentation

## 2012-02-26 HISTORY — DX: Essential (primary) hypertension: I10

## 2012-02-26 HISTORY — DX: Major depressive disorder, single episode, unspecified: F32.9

## 2012-02-26 HISTORY — DX: Depression, unspecified: F32.A

## 2012-02-26 HISTORY — DX: Headache: R51

## 2012-02-26 LAB — CBC
HCT: 39.8 % (ref 36.0–46.0)
Hemoglobin: 13.5 g/dL (ref 12.0–15.0)
MCH: 30.6 pg (ref 26.0–34.0)
MCHC: 33.9 g/dL (ref 30.0–36.0)
MCV: 90.2 fL (ref 78.0–100.0)
Platelets: 247 10*3/uL (ref 150–400)
RBC: 4.41 MIL/uL (ref 3.87–5.11)
RDW: 12.6 % (ref 11.5–15.5)
WBC: 4.2 10*3/uL (ref 4.0–10.5)

## 2012-02-26 LAB — SURGICAL PCR SCREEN
MRSA, PCR: NEGATIVE
Staphylococcus aureus: NEGATIVE

## 2012-02-26 LAB — BASIC METABOLIC PANEL
BUN: 11 mg/dL (ref 6–23)
CO2: 30 mEq/L (ref 19–32)
Calcium: 10 mg/dL (ref 8.4–10.5)
Chloride: 101 mEq/L (ref 96–112)
Creatinine, Ser: 0.73 mg/dL (ref 0.50–1.10)
GFR calc Af Amer: >90 mL/min (ref >90)
GFR calc non Af Amer: >90 mL/min (ref >90)
Glucose, Bld: 96 mg/dL (ref 70–99)
Potassium: 4.5 mEq/L (ref 3.5–5.1)
Sodium: 138 mEq/L (ref 135–145)

## 2012-02-26 NOTE — Pre-Procedure Instructions (Signed)
NO PREOP ORDERS IN EPIC FROM DR. MARTIN. PREOP LABS WERE DONE AS PER ANESTHESIOLOGIST'S GUIDELINES--CBC, BMET, EKG AND CXR. PREOP INSTRUCTIONS WERE DISCUSSED WITH PT USING TEACH BACK METHOD.

## 2012-02-26 NOTE — Patient Instructions (Signed)
YOUR SURGERY IS SCHEDULED ON:  WED  7/24  AT 11:00 AM  REPORT TO [place] [facility] AT: 9:00 AM      PHONE # FOR SHORT STAY IS [phone]  DO NOT EAT OR DRINK ANYTHING AFTER MIDNIGHT THE NIGHT BEFORE YOUR SURGERY.  YOU MAY BRUSH YOUR TEETH, RINSE OUT YOUR MOUTH--BUT NO WATER, NO FOOD, NO CHEWING GUM, NO MINTS, NO CANDIES, NO CHEWING TOBACCO.  PLEASE TAKE THE FOLLOWING MEDICATIONS THE AM OF YOUR SURGERY WITH A FEW SIPS OF WATER:  VENLAFAXINE    IF YOU USE INHALERS--USE YOUR INHALERS THE AM OF YOUR SURGERY AND BRING INHALERS TO THE HOSPITAL -TAKE TO SURGERY.    IF YOU ARE DIABETIC:  DO NOT TAKE ANY DIABETIC MEDICATIONS THE AM OF YOUR SURGERY.  IF YOU TAKE INSULIN IN THE EVENINGS--PLEASE ONLY TAKE 1/2 NORMAL EVENING DOSE THE NIGHT BEFORE YOUR SURGERY.  NO INSULIN THE AM OF YOUR SURGERY.  IF YOU HAVE SLEEP APNEA AND USE CPAP OR BIPAP--PLEASE BRING THE MASK --NOT THE MACHINE-NOT THE TUBING   -JUST THE MASK. DO NOT BRING VALUABLES, MONEY, CREDIT CARDS.  CONTACT LENS, DENTURES / PARTIALS, GLASSES SHOULD NOT BE WORN TO SURGERY AND IN MOST CASES-HEARING AIDS WILL NEED TO BE REMOVED.  BRING YOUR GLASSES CASE, ANY EQUIPMENT NEEDED FOR YOUR CONTACT LENS. FOR PATIENTS ADMITTED TO THE HOSPITAL--CHECK OUT TIME THE DAY OF DISCHARGE IS 11:00 AM.  ALL INPATIENT ROOMS ARE PRIVATE - WITH BATHROOM, TELEPHONE, TELEVISION AND WIFI INTERNET. IF YOU ARE BEING DISCHARGED THE SAME DAY OF YOUR SURGERY--YOU CAN NOT DRIVE YOURSELF HOME--AND SHOULD NOT GO HOME ALONE BY TAXI OR BUS.  NO DRIVING OR OPERATING MACHINERY FOR 24 HOURS FOLLOWING ANESTHESIA / PAIN MEDICATIONS.                            SPECIAL INSTRUCTIONS:  CHLORHEXIDINE SOAP SHOWER (other brand names are Betasept and Hibiclens ) PLEASE SHOWER WITH CHLORHEXIDINE THE NIGHT BEFORE YOUR SURGERY AND THE AM OF YOUR SURGERY. DO NOT USE CHLORHEXIDINE ON YOUR FACE OR PRIVATE AREAS--YOU MAY USE YOUR NORMAL SOAP THOSE AREAS AND YOUR NORMAL SHAMPOO.  WOMEN  SHOULD AVOID SHAVING UNDER ARMS AND SHAVING LEGS 48 HOURS BEFORE USING CHLORHEXIDINE TO AVOID SKIN IRRITATION.  DO NOT USE IF ALLERGIC TO CHLORHEXIDINE.  PLEASE READ OVER ANY  FACT SHEETS THAT YOU WERE GIVEN: MRSA INFORMATION

## 2012-02-27 NOTE — Pre-Procedure Instructions (Signed)
NEGATIVE ADENOSINE HEART STRESS TEST 03/15/10--COPY OF REPORT ON CHART--IT WAS DONE BY [facility].

## 2012-02-28 ENCOUNTER — Other Ambulatory Visit (INDEPENDENT_AMBULATORY_CARE_PROVIDER_SITE_OTHER): Payer: Self-pay | Admitting: Surgery

## 2012-03-04 ENCOUNTER — Inpatient Hospital Stay (HOSPITAL_COMMUNITY)
Admission: RE | Admit: 2012-03-04 | Discharge: 2012-03-05 | DRG: 328 | Disposition: A | Discharge status: Home / Self Care | Payer: 59 | Source: Ambulatory Visit | Attending: Surgery | Admitting: Surgery

## 2012-03-04 ENCOUNTER — Encounter (HOSPITAL_COMMUNITY): Payer: Self-pay | Admitting: *Deleted

## 2012-03-04 ENCOUNTER — Encounter (HOSPITAL_COMMUNITY)
Admission: RE | Disposition: A | Discharge status: Home / Self Care | Payer: Self-pay | Source: Ambulatory Visit | Attending: Surgery

## 2012-03-04 ENCOUNTER — Ambulatory Visit (HOSPITAL_COMMUNITY): Payer: 59 | Admitting: Anesthesiology

## 2012-03-04 ENCOUNTER — Encounter (HOSPITAL_COMMUNITY): Payer: Self-pay | Admitting: Anesthesiology

## 2012-03-04 DIAGNOSIS — K660 Peritoneal adhesions (postprocedural) (postinfection): Secondary | ICD-10-CM

## 2012-03-04 DIAGNOSIS — Z9884 Bariatric surgery status: Secondary | ICD-10-CM

## 2012-03-04 DIAGNOSIS — K219 Gastro-esophageal reflux disease without esophagitis: Secondary | ICD-10-CM | POA: Diagnosis present

## 2012-03-04 DIAGNOSIS — R1013 Epigastric pain: Secondary | ICD-10-CM | POA: Diagnosis present

## 2012-03-04 DIAGNOSIS — Y92009 Unspecified place in unspecified non-institutional (private) residence as the place of occurrence of the external cause: Secondary | ICD-10-CM

## 2012-03-04 DIAGNOSIS — F329 Major depressive disorder, single episode, unspecified: Secondary | ICD-10-CM | POA: Diagnosis present

## 2012-03-04 DIAGNOSIS — F3289 Other specified depressive episodes: Secondary | ICD-10-CM | POA: Diagnosis present

## 2012-03-04 DIAGNOSIS — I10 Essential (primary) hypertension: Secondary | ICD-10-CM | POA: Diagnosis present

## 2012-03-04 DIAGNOSIS — Y832 Surgical operation with anastomosis, bypass or graft as the cause of abnormal reaction of the patient, or of later complication, without mention of misadventure at the time of the procedure: Secondary | ICD-10-CM | POA: Diagnosis present

## 2012-03-04 DIAGNOSIS — K9589 Other complications of other bariatric procedure: Principal | ICD-10-CM | POA: Diagnosis present

## 2012-03-04 DIAGNOSIS — K589 Irritable bowel syndrome without diarrhea: Secondary | ICD-10-CM | POA: Diagnosis present

## 2012-03-04 HISTORY — PX: LAPAROSCOPY: SHX197

## 2012-03-04 LAB — CBC
HCT: 30.6 % — ABNORMAL LOW (ref 36.0–46.0)
Hemoglobin: 10.5 g/dL — ABNORMAL LOW (ref 12.0–15.0)
MCH: 30.4 pg (ref 26.0–34.0)
MCHC: 34.3 g/dL (ref 30.0–36.0)
MCV: 88.7 fL (ref 78.0–100.0)
Platelets: 170 10*3/uL (ref 150–400)
RBC: 3.45 MIL/uL — ABNORMAL LOW (ref 3.87–5.11)
RDW: 12.5 % (ref 11.5–15.5)
WBC: 4.8 10*3/uL (ref 4.0–10.5)

## 2012-03-04 LAB — CREATININE, SERUM
Creatinine, Ser: 0.73 mg/dL (ref 0.50–1.10)
GFR calc Af Amer: >90 mL/min (ref >90)
GFR calc non Af Amer: >90 mL/min (ref >90)

## 2012-03-04 LAB — DIFFERENTIAL
Basophils Absolute: 0 10*3/uL (ref 0.0–0.1)
Basophils Relative: 0 % (ref 0–1)
Eosinophils Absolute: 0 10*3/uL (ref 0.0–0.7)
Eosinophils Relative: 1 % (ref 0–5)
Lymphocytes Relative: 24 % (ref 12–46)
Lymphs Abs: 1.2 10*3/uL (ref 0.7–4.0)
Monocytes Absolute: 0.3 10*3/uL (ref 0.1–1.0)
Monocytes Relative: 7 % (ref 3–12)
Neutro Abs: 3.3 10*3/uL (ref 1.7–7.7)
Neutrophils Relative %: 69 % (ref 43–77)

## 2012-03-04 SURGERY — LAPAROSCOPY, DIAGNOSTIC
Anesthesia: General

## 2012-03-04 MED ORDER — UNJURY CHICKEN SOUP POWDER: 2.0000 [oz_av] | Freq: Four times a day (QID) | ORAL | Status: DC

## 2012-03-04 MED ORDER — UNJURY CHOCOLATE CLASSIC POWDER: 2.0000 [oz_av] | Freq: Four times a day (QID) | ORAL | Status: DC

## 2012-03-04 MED ORDER — UNJURY VANILLA POWDER: 2.0000 [oz_av] | Freq: Four times a day (QID) | ORAL | Status: DC

## 2012-03-04 MED ORDER — PROMETHAZINE HCL 25 MG/ML IJ SOLN: 6.2500 mg | INTRAMUSCULAR | Status: DC | PRN

## 2012-03-04 MED ORDER — KCL IN DEXTROSE-NACL 20-5-0.45 MEQ/L-%-% IV SOLN
INTRAVENOUS | Status: DC
  Administered 2012-03-04 (×3): via INTRAVENOUS
  Administered 2012-03-05: 100 mL via INTRAVENOUS
  Filled 2012-03-04 (×5): qty 1000

## 2012-03-04 MED ORDER — PROPOFOL 10 MG/ML IV EMUL
INTRAVENOUS | Status: DC | PRN
  Administered 2012-03-04: 120 mg via INTRAVENOUS

## 2012-03-04 MED ORDER — HYDROMORPHONE HCL PF 1 MG/ML IJ SOLN: 0.2500 mg | INTRAMUSCULAR | Status: DC | PRN

## 2012-03-04 MED ORDER — BUPIVACAINE HCL (PF) 0.5 % IJ SOLN
INTRAMUSCULAR | Status: AC
  Filled 2012-03-04: qty 30

## 2012-03-04 MED ORDER — OXYCODONE-ACETAMINOPHEN 5-325 MG/5ML PO SOLN: 5.0000 mL | ORAL | Status: DC | PRN

## 2012-03-04 MED ORDER — LACTATED RINGERS IV SOLN
INTRAVENOUS | Status: DC | PRN
  Administered 2012-03-04 (×2): via INTRAVENOUS

## 2012-03-04 MED ORDER — SUCCINYLCHOLINE CHLORIDE 20 MG/ML IJ SOLN
INTRAMUSCULAR | Status: DC | PRN
  Administered 2012-03-04: 100 mg via INTRAVENOUS

## 2012-03-04 MED ORDER — LACTATED RINGERS IV SOLN: INTRAVENOUS | Status: DC

## 2012-03-04 MED ORDER — EPHEDRINE SULFATE 50 MG/ML IJ SOLN
INTRAMUSCULAR | Status: DC | PRN
  Administered 2012-03-04: 5 mg via INTRAVENOUS

## 2012-03-04 MED ORDER — BUPIVACAINE LIPOSOME 1.3 % IJ SUSP
20.0000 mL | Freq: Once | INTRAMUSCULAR | Status: AC
  Administered 2012-03-04: 20 mL
  Filled 2012-03-04: qty 20

## 2012-03-04 MED ORDER — ACETAMINOPHEN 10 MG/ML IV SOLN
1000.0000 mg | Freq: Four times a day (QID) | INTRAVENOUS | Status: AC
  Administered 2012-03-04 – 2012-03-05 (×4): 1000 mg via INTRAVENOUS
  Filled 2012-03-04 (×6): qty 100

## 2012-03-04 MED ORDER — FENTANYL CITRATE 0.05 MG/ML IJ SOLN
INTRAMUSCULAR | Status: DC | PRN
  Administered 2012-03-04: 100 ug via INTRAVENOUS
  Administered 2012-03-04 (×2): 50 ug via INTRAVENOUS

## 2012-03-04 MED ORDER — LACTATED RINGERS IR SOLN
Status: DC | PRN
  Administered 2012-03-04: 1000 mL

## 2012-03-04 MED ORDER — GLYCOPYRROLATE 0.2 MG/ML IJ SOLN
INTRAMUSCULAR | Status: DC | PRN
  Administered 2012-03-04: 0.4 mg via INTRAVENOUS

## 2012-03-04 MED ORDER — KCL IN DEXTROSE-NACL 20-5-0.45 MEQ/L-%-% IV SOLN
INTRAVENOUS | Status: AC
  Filled 2012-03-04: qty 1000

## 2012-03-04 MED ORDER — 0.9 % SODIUM CHLORIDE (POUR BTL) OPTIME
TOPICAL | Status: DC | PRN
  Administered 2012-03-04: 1000 mL

## 2012-03-04 MED ORDER — LACTATED RINGERS IV SOLN
INTRAVENOUS | Status: DC
  Administered 2012-03-04: 1000 mL via INTRAVENOUS

## 2012-03-04 MED ORDER — NEOSTIGMINE METHYLSULFATE 1 MG/ML IJ SOLN
INTRAMUSCULAR | Status: DC | PRN
  Administered 2012-03-04: 3 mg via INTRAVENOUS

## 2012-03-04 MED ORDER — MIDAZOLAM HCL 5 MG/5ML IJ SOLN
INTRAMUSCULAR | Status: DC | PRN
  Administered 2012-03-04: 2 mg via INTRAVENOUS

## 2012-03-04 MED ORDER — ONDANSETRON HCL 4 MG/2ML IJ SOLN
INTRAMUSCULAR | Status: DC | PRN
  Administered 2012-03-04: 4 mg via INTRAVENOUS

## 2012-03-04 MED ORDER — ACETAMINOPHEN 160 MG/5ML PO SOLN: 650.0000 mg | ORAL | Status: DC | PRN

## 2012-03-04 MED ORDER — DEXTROSE 5 % IV SOLN
2.0000 g | INTRAVENOUS | Status: AC
  Administered 2012-03-04: 2 g via INTRAVENOUS

## 2012-03-04 MED ORDER — CEFOXITIN SODIUM-DEXTROSE 1-4 GM-% IV SOLR (PREMIX)
INTRAVENOUS | Status: AC
  Filled 2012-03-04: qty 100

## 2012-03-04 MED ORDER — HEPARIN SODIUM (PORCINE) 5000 UNIT/ML IJ SOLN
5000.0000 [IU] | Freq: Three times a day (TID) | INTRAMUSCULAR | Status: DC
  Administered 2012-03-04 – 2012-03-05 (×2): 5000 [IU] via SUBCUTANEOUS
  Filled 2012-03-04 (×5): qty 1

## 2012-03-04 MED ORDER — HEPARIN SODIUM (PORCINE) 5000 UNIT/ML IJ SOLN
5000.0000 [IU] | INTRAMUSCULAR | Status: AC
  Administered 2012-03-04: 5000 [IU] via SUBCUTANEOUS
  Filled 2012-03-04: qty 1

## 2012-03-04 MED ORDER — ROCURONIUM BROMIDE 100 MG/10ML IV SOLN
INTRAVENOUS | Status: DC | PRN
  Administered 2012-03-04: 10 mg via INTRAVENOUS
  Administered 2012-03-04: 25 mg via INTRAVENOUS

## 2012-03-04 MED ORDER — ONDANSETRON HCL 4 MG/2ML IJ SOLN: 4.0000 mg | INTRAMUSCULAR | Status: DC | PRN

## 2012-03-04 MED ORDER — MORPHINE SULFATE 2 MG/ML IJ SOLN
2.0000 mg | INTRAMUSCULAR | Status: DC | PRN
  Administered 2012-03-04 – 2012-03-05 (×4): 4 mg via INTRAVENOUS
  Administered 2012-03-05 (×2): 2 mg via INTRAVENOUS
  Filled 2012-03-04: qty 2
  Filled 2012-03-04 (×2): qty 1
  Filled 2012-03-04 (×3): qty 2

## 2012-03-04 SURGICAL SUPPLY — 41 items
ADH SKN CLS APL DERMABOND .7 (GAUZE/BANDAGES/DRESSINGS) ×2
APL SKNCLS STERI-STRIP NONHPOA (GAUZE/BANDAGES/DRESSINGS)
BAG SPEC RTRVL LRG 6X4 10 (ENDOMECHANICALS) ×2
BENZOIN TINCTURE PRP APPL 2/3 (GAUZE/BANDAGES/DRESSINGS) IMPLANT
CANISTER SUCTION 2500CC (MISCELLANEOUS) ×3 IMPLANT
CLOTH BEACON ORANGE TIMEOUT ST (SAFETY) ×3 IMPLANT
COVER SURGICAL LIGHT HANDLE (MISCELLANEOUS) ×2 IMPLANT
DECANTER SPIKE VIAL GLASS SM (MISCELLANEOUS) IMPLANT
DERMABOND ADVANCED (GAUZE/BANDAGES/DRESSINGS) ×1
DERMABOND ADVANCED .7 DNX12 (GAUZE/BANDAGES/DRESSINGS) IMPLANT
DEVICE TROCAR PUNCTURE CLOSURE (ENDOMECHANICALS) ×1 IMPLANT
DRAPE LAPAROSCOPIC ABDOMINAL (DRAPES) ×3 IMPLANT
ELECT REM PT RETURN 9FT ADLT (ELECTROSURGICAL) ×3
ELECTRODE REM PT RTRN 9FT ADLT (ELECTROSURGICAL) ×2 IMPLANT
GLOVE BIOGEL M 8.0 STRL (GLOVE) ×3 IMPLANT
GLOVE BIOGEL PI IND STRL 7.0 (GLOVE) ×2 IMPLANT
GLOVE BIOGEL PI INDICATOR 7.0 (GLOVE) ×1
GOWN STRL NON-REIN LRG LVL3 (GOWN DISPOSABLE) ×3 IMPLANT
GOWN STRL REIN XL XLG (GOWN DISPOSABLE) ×6 IMPLANT
HAND ACTIVATED (MISCELLANEOUS) ×1 IMPLANT
HANDLE STAPLE EGIA 4 XL (STAPLE) ×1 IMPLANT
KIT BASIN OR (CUSTOM PROCEDURE TRAY) ×3 IMPLANT
POUCH SPECIMEN RETRIEVAL 10MM (ENDOMECHANICALS) ×1 IMPLANT
RELOAD EGIA 60 MED/THCK PURPLE (STAPLE) ×3 IMPLANT
RELOAD STAPLE 60 MED/THCK ART (STAPLE) IMPLANT
SET IRRIG TUBING LAPAROSCOPIC (IRRIGATION / IRRIGATOR) ×1 IMPLANT
SLEEVE Z-THREAD 5X100MM (TROCAR) IMPLANT
SOLUTION ANTI FOG 6CC (MISCELLANEOUS) ×3 IMPLANT
STRIP CLOSURE SKIN 1/2X4 (GAUZE/BANDAGES/DRESSINGS) IMPLANT
SUT VIC AB 4-0 SH 18 (SUTURE) ×1 IMPLANT
SYR 30ML LL (SYRINGE) ×3 IMPLANT
TOWEL OR NON WOVEN STRL DISP B (DISPOSABLE) ×1 IMPLANT
TRAY FOLEY CATH 14FRSI W/METER (CATHETERS) ×3 IMPLANT
TRAY LAP CHOLE (CUSTOM PROCEDURE TRAY) ×3 IMPLANT
TROCAR HASSON GELL 12X100 (TROCAR) IMPLANT
TROCAR XCEL BLADELESS 5X75MML (TROCAR) ×1 IMPLANT
TROCAR Z-THREAD FIOS 11X100 BL (TROCAR) IMPLANT
TROCAR Z-THREAD FIOS 12X100MM (TROCAR) ×1 IMPLANT
TROCAR Z-THREAD FIOS 5X100MM (TROCAR) ×2 IMPLANT
TROCAR Z-THREAD SLEEVE 11X100 (TROCAR) IMPLANT
TUBING INSUFFLATION 10FT LAP (TUBING) ×3 IMPLANT

## 2012-03-04 NOTE — Progress Notes (Signed)
Utilization review completed.  

## 2012-03-04 NOTE — Anesthesia Preprocedure Evaluation (Addendum)
Anesthesia Evaluation  Patient identified by MRN, date of birth, ID band Patient awake    Reviewed: Allergy & Precautions, H&P , NPO status , Patient's Chart, lab work & pertinent test results  Airway Mallampati: II TM Distance: >3 FB Neck ROM: Full    Dental  (+) Chipped, Teeth Intact and Dental Advisory Given,    Pulmonary pneumonia -,  breath sounds clear to auscultation  Pulmonary exam normal       Cardiovascular hypertension, Pt. on medications Rhythm:Regular Rate:Normal     Neuro/Psych  Headaches, Depression negative psych ROS   GI/Hepatic Neg liver ROS, GERD-  Medicated,Prior gastric bypass   Endo/Other  negative endocrine ROS  Renal/GU negative Renal ROS  negative genitourinary   Musculoskeletal negative musculoskeletal ROS (+)   Abdominal   Peds negative pediatric ROS (+)  Hematology negative hematology ROS (+)   Anesthesia Other Findings   Reproductive/Obstetrics negative OB ROS                          Anesthesia Physical Anesthesia Plan  ASA: II  Anesthesia Plan: General   Post-op Pain Management:    Induction: Intravenous  Airway Management Planned: Oral ETT  Additional Equipment:   Intra-op Plan:   Post-operative Plan: Extubation in OR  Informed Consent: I have reviewed the patients History and Physical, chart, labs and discussed the procedure including the risks, benefits and alternatives for the proposed anesthesia with the patient or authorized representative who has indicated his/her understanding and acceptance.   Dental advisory given  Plan Discussed with: CRNA  Anesthesia Plan Comments: (No gastric tube placement unless directed by surgeon.)        Anesthesia Quick Evaluation

## 2012-03-04 NOTE — Anesthesia Postprocedure Evaluation (Signed)
Anesthesia Post Note  Patient: Kari Gibbs  Procedure(s) Performed: Procedure(s) (LRB): LAPAROSCOPY DIAGNOSTIC (N/A) UPPER GI ENDOSCOPY ()  Anesthesia type: General  Patient location: PACU  Post pain: Pain level controlled  Post assessment: Post-op Vital signs reviewed  Last Vitals:  Filed Vitals:   03/04/12 1245  BP:   Pulse: 84  Temp:   Resp: 14    Post vital signs: Reviewed  Level of consciousness: sedated  Complications: No apparent anesthesia complications

## 2012-03-04 NOTE — Preoperative (Signed)
Beta Blockers   Reason not to administer Beta Blockers:Not Applicable 

## 2012-03-04 NOTE — Transfer of Care (Signed)
Immediate Anesthesia Transfer of Care Note  Patient: Kari Gibbs  Procedure(s) Performed: Procedure(s) (LRB): LAPAROSCOPY DIAGNOSTIC (N/A) UPPER GI ENDOSCOPY ()  Patient Location: PACU  Anesthesia Type: General  Level of Consciousness: awake, alert , oriented and patient cooperative  Airway & Oxygen Therapy: Patient Spontanous Breathing and Patient connected to face mask oxygen  Post-op Assessment: Report given to PACU RN, Post -op Vital signs reviewed and stable and Patient moving all extremities X 4  Post vital signs: Reviewed and stable  Complications: No apparent anesthesia complications

## 2012-03-04 NOTE — Op Note (Signed)
Surgeon: Wenda Low, MD, FACS  Asst:  Jaclynn Guarneri, MD, FACS  Anes:  general  Procedure: Laparoscopy with resection of portion of roux limb (the candy cane)  Diagnosis: Recurrent postprandial epigastric pain  Complications: none  EBL:   5 cc  Description of Procedure:  The patient was taken to room 11 and placed in supine position and given general anesthesia. The abdomen was prepped with PCMX draped sterilely. Timeout was performed. Patient had a previous Roux-en-Y gastric bypass 2 years ago and has had intermittent upper abdominal pain postprandially. Upper endoscopy is normal and previously some adhesions were taken down from recurrent limb. At this point we felt that we would probably resect her candy cane.     The abdomen was entered through the left upper quadrant using a 0 5 mm Optiview technique without difficulty. Total of 4 trochars were used eventually upsizing 1 to a 12 for stapler. The liver was easily and elevated and there were some adhesions to the family which were taken down with sharp dissection. The adhesions were no where near as bad as they were last time and this was all stuck and, wall. After taking these down we could see the pouch proximally. The remainder of the bowel appeared to be fine. Gallbladder was a nice blue color and without adhesions and no evidence of any stones.  Candycane appeared to be somewhat longer I went ahead and dissected away from the fairly him. The mesentery of the candycane was taken near the bowel itself with the harmonic scalpel. Proximally we were up very close to the pouch the candycane was resected with a single firing of the 6 cm GIA purple  Load.bowel was placed in a bag and brought to the 12th of millimeter trocar site. All wounds were injected with Exparel. The 12 mm Moon was closed with the Endo Close device and a 0 Vicryl. Wounds were closed 4-0 Vicryl and with Dermabond. Patient are that she did well was taken recovery room in  satisfactory condition.  Matt B. Daphine Deutscher, MD, [facility], [place] [phone]

## 2012-03-04 NOTE — H&P (Signed)
Chief Complaint: persistant postprandial epigastric pain 2 years after gastric bypass  History of Present Illness: Kari Gibbs is an 61 y.o. female who is 2 years out from a lap gastric bypass and 1 year out from laparoscopy and enterolysis of her roux limb. At that time the efferent limb of her Roux was taken down from some dense adhesions to the anterior abdominal wall.  This seemed to help initially but has continued to has postprandial complaints.  She describes a fluttering epigastric sensation when this is hurting. She and her husband came in today to see if there is anything that can be done.  We have talked about options and I think that we should perform laparoscopy and endoscopy and probably resect her "candy cane". Will schedule her procedure. She and her husband are aware of the risks and benefits of this procedure.  She has lost as much weight as she needs to lose and has grown more dependent on oxycodone for pain relief.   Past Medical History   Diagnosis  Date   .  Chills    .  Fatigue    .  Bronchitis    .  Nausea & vomiting    .  IBS (irritable bowel syndrome)    .  Reflux    .  Abdominal pain    .  Arthritis    .  Sore throat    .  Pneumonia     Past Surgical History   Procedure  Date   .  Gastric bypass    .  Exploratory laparotomy     Current Outpatient Prescriptions   Medication  Sig  Dispense  Refill   .  calcium-vitamin D (CALCIUM 500/D) 500-200 MG-UNIT per tablet  Take 1 tablet by mouth 3 (three) times daily.     .  fluticasone (FLONASE) 50 MCG/ACT nasal spray  1 spray by Nasal route 2 (two) times daily as needed. 1 spray in each nostril two times a day as needed     .  fluticasone (FLONASE) 50 MCG/ACT nasal spray  1 spray by Nasal route every morning.     Marland Kitchen  HYDROcodone-acetaminophen (VICODIN) 5-325 mg TABS  Take 6 tablets by mouth every 6 (six) hours.  30 tablet  0   .  irbesartan (AVAPRO) 300 MG tablet  Take 300 mg by mouth daily.     Marland Kitchen  LORazepam (ATIVAN)  0.5 MG tablet  daily.     .  Melatonin 5 MG TABS  Take 1 tablet by mouth at bedtime as needed.     .  Multiple Vitamin (MULTIVITAMIN) tablet  Take 1 tablet by mouth daily.     .  Omeprazole ([facility] OMEPRAZOLE) 20 MG TBEC  Take 1 tablet by mouth daily.     Marland Kitchen  oxyCODONE-acetaminophen (ROXICET) 5-325 MG per tablet  Take 1 tablet by mouth every 6 (six) hours as needed for pain.  40 tablet  0   .  PARoxetine (PAXIL) 30 MG tablet  Take 30 mg by mouth daily.     .  Probiotic Product (ALIGN) 4 MG CAPS  Take 1 capsule by mouth at bedtime. Per Pt.     .  Tamsulosin HCl (FLOMAX) 0.4 MG CAPS  Ad lib.     Marland Kitchen  telmisartan (MICARDIS) 40 MG tablet  Take 40 mg by mouth daily. Per PCP     .  vitamin B-12 (CYANOCOBALAMIN) 500 MCG tablet  Take 500 mcg by mouth daily.     Marland Kitchen  Vitamin D, Ergocalciferol, (DRISDOL) 50000 UNITS CAPS  Once a week.     Iohexol; Ace inhibitors; and Losartan potassium  No family history on file.  Social History: reports that she has never smoked. She does not have any smokeless tobacco history on file. She reports that she does not drink alcohol or use illicit drugs.  REVIEW OF SYSTEMS - PERTINENT POSITIVES ONLY:  neg  Physical Exam:  Blood pressure 98/62, pulse 76, temperature 98.5 F (36.9 C), temperature source Temporal, resp. rate 18, height 5\' 6"  (1.676 m), weight 126 lb 2 oz (57.21 kg).  Body mass index is 20.36 kg/(m^2).  Gen: WDWN white female NAD  Neurological: Alert and oriented to person, place, and time. Motor and sensory function is grossly intact  Head: Normocephalic and atraumatic.  Eyes: Conjunctivae are normal. Pupils are equal, round, and reactive to light. No scleral icterus.  Neck: Normal range of motion. Neck supple. No tracheal deviation or thyromegaly present.  Cardiovascular: SR without murmurs or gallops. No carotid bruits  Respiratory: Effort normal. No respiratory distress. No chest wall tenderness. Breath sounds normal. No wheezes, rales or rhonchi.  Abdomen:  nontender at present No herniae noted.  GU:  Musculoskeletal: Normal range of motion. Extremities are nontender. No cyanosis, edema or clubbing noted Lymphadenopathy: No cervical, preauricular, postauricular or axillary adenopathy is present Skin: Skin is warm and dry. No rash noted. No diaphoresis. No erythema. No pallor. Pscyh: Normal mood and affect. Behavior is normal. Judgment and thought content normal.  LABORATORY RESULTS:  No results found for this or any previous visit (from the past 48 hour(s)).  RADIOLOGY RESULTS:  No results found.  Problem List:  Patient Active Problem List   Diagnoses   .  CLOSTRIDIUM DIFFICILE COLITIS   .  DEPRESSION   .  HYPERTENSION   .  ALLERGIC RHINITIS   .  GERD   .  OSTEOARTHRITIS   .  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 Plan to perform repeat laparoscopy and possibly  Resect the  "candy cane" of her roux limb.    Matt B. Daphine Deutscher, MD, [facility]  [phone] beeper  [phone]

## 2012-03-05 ENCOUNTER — Encounter (HOSPITAL_COMMUNITY): Payer: Self-pay | Admitting: Surgery

## 2012-03-05 ENCOUNTER — Inpatient Hospital Stay (HOSPITAL_COMMUNITY): Payer: 59

## 2012-03-05 LAB — CBC
HCT: 30.3 % — ABNORMAL LOW (ref 36.0–46.0)
Hemoglobin: 10.3 g/dL — ABNORMAL LOW (ref 12.0–15.0)
MCH: 30.6 pg (ref 26.0–34.0)
MCHC: 34 g/dL (ref 30.0–36.0)
MCV: 89.9 fL (ref 78.0–100.0)
Platelets: 159 10*3/uL (ref 150–400)
RBC: 3.37 MIL/uL — ABNORMAL LOW (ref 3.87–5.11)
RDW: 12.6 % (ref 11.5–15.5)
WBC: 4.4 10*3/uL (ref 4.0–10.5)

## 2012-03-05 MED ORDER — IOHEXOL 300 MG/ML  SOLN: 50.0000 mL | Freq: Once | INTRAMUSCULAR | Status: DC | PRN

## 2012-03-05 MED ORDER — OXYCODONE-ACETAMINOPHEN 5-325 MG/5ML PO SOLN: 5.0000 mL | ORAL | Status: DC | PRN

## 2012-03-05 NOTE — Discharge Summary (Signed)
Physician Discharge Summary  Patient ID: Kari Gibbs MRN: [id number] DOB/AGE: Aug 26, 1950 61 y.o.  Admit date: 03/04/2012 Discharge date: 03/05/2012  Admission Diagnoses:  Continue epigastric pain 2 years after roux Y gastric bypass  Discharge Diagnoses:  same  Active Problems:  * No active hospital problems. *    Surgery:  Lap resection of "candy cane" off roux limb  Discharged Condition: improved  Hospital Course:   Had surgery.  PD had UGI which looked good.  Ready for discharge on PD 1  Consults: none  Significant Diagnostic Studies: UGI    Discharge Exam: Blood pressure 114/67, pulse 56, temperature 97.8 F (36.6 C), temperature source Oral, resp. rate 16, height 5\' 6"  (1.676 m), weight 127 lb (57.607 kg), SpO2 100.00%.Doing well.    Disposition: 01-Home or Self Care  Discharge Orders    Future Appointments: Provider: Department: Dept Phone: Center:   03/24/2012 2:10 PM Valarie Merino, MD [facility] [phone] None     Future Orders Please Complete By Expires   Diet Carb Modified      Increase activity slowly        Medication List  As of 03/05/2012 12:39 PM   STOP taking these medications         HYDROcodone-acetaminophen 5-325 MG per tablet         TAKE these medications         ALIGN 4 MG Caps   Take 1 capsule by mouth at bedtime. Per Pt.      azelastine 137 MCG/SPRAY nasal spray   Commonly known as: ASTELIN   Place 1 spray into the nose 2 (two) times daily as needed. Use in each nostril as directed      FLONASE 50 MCG/ACT nasal spray   Generic drug: fluticasone   Place 1 spray into the nose 2 (two) times daily as needed. 1 spray in each nostril two times a day as needed      Melatonin 5 MG Tabs   Take 1 tablet by mouth at bedtime as needed. sleep      OVER THE COUNTER MEDICATION   Take 30 mLs by mouth 2 (two) times daily. wellesse multi vitamin      oxyCODONE-acetaminophen 5-325 MG/5ML solution   Commonly known as: ROXICET   Take  5-10 mLs by mouth every 4 (four) hours as needed.      telmisartan 40 MG tablet   Commonly known as: MICARDIS   Take 20 mg by mouth daily with breakfast.      venlafaxine 75 MG tablet   Commonly known as: EFFEXOR   Take 225 mg by mouth daily with breakfast.      vitamin B-12 500 MCG tablet   Commonly known as: CYANOCOBALAMIN   Take 500 mcg by mouth every other day.      WELLESSE VITAMIN D3 1000 UNIT/10ML Liqd   Generic drug: Cholecalciferol   Take 15-30 mLs by mouth 2 (two) times daily. 2 tablespoon in the morning and 1 in the afternoon      cholecalciferol 1000 UNITS tablet   Commonly known as: VITAMIN D   Take 1,000 Units by mouth daily.      Vitamin D3 3000 UNITS Tabs   Take by mouth. 2000 IU GEL PILL - OVER THE COUNTER-ONE EVERY AM           Follow-up Information    Follow up with Tyla Burgner B, MD in 4 weeks.   Contact information:   [facility] [address], [address] [place] [address] [phone]          Signed: Valarie Merino 03/05/2012, 12:39 PM

## 2012-03-11 ENCOUNTER — Telehealth (INDEPENDENT_AMBULATORY_CARE_PROVIDER_SITE_OTHER): Payer: Self-pay

## 2012-03-11 ENCOUNTER — Telehealth (INDEPENDENT_AMBULATORY_CARE_PROVIDER_SITE_OTHER): Payer: Self-pay | Admitting: General Surgery

## 2012-03-11 NOTE — Telephone Encounter (Signed)
The patient called in stating she is low on her Roxicet Elixir.  She would like a refill and she knows she has to pick it up.  Please call her when it's ready.

## 2012-03-11 NOTE — Telephone Encounter (Signed)
Called patient cell and left message advising her request was received and will be at the front desk.  Also called the patient home number, was able to talk to her directly and advise the refill she requested was written by Dr. Daphine Deutscher. Roxicet elixir 300 cc, 5 cc po every 4 hours prn for pain.

## 2012-03-24 ENCOUNTER — Ambulatory Visit (INDEPENDENT_AMBULATORY_CARE_PROVIDER_SITE_OTHER): Payer: 59 | Admitting: Surgery

## 2012-03-24 ENCOUNTER — Encounter (INDEPENDENT_AMBULATORY_CARE_PROVIDER_SITE_OTHER): Payer: Self-pay | Admitting: Surgery

## 2012-03-24 VITALS — BP 98/68 | HR 68 | Temp 98.2°F | Resp 16 | Ht 66.0 in | Wt 128.8 lb

## 2012-03-24 DIAGNOSIS — Z9889 Other specified postprocedural states: Secondary | ICD-10-CM

## 2012-03-24 DIAGNOSIS — Z9884 Bariatric surgery status: Secondary | ICD-10-CM

## 2012-03-24 NOTE — Progress Notes (Signed)
[name] 61 y.o.  Body mass index is 20.79 kg/(m^2).  Patient Active Problem List  Diagnosis  . CLOSTRIDIUM DIFFICILE COLITIS  . DEPRESSION  . HYPERTENSION  . ALLERGIC RHINITIS  . GERD  . OSTEOARTHRITIS  . 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 Abdominal pain    Allergies  Allergen Reactions  . Iohexol      Code: HIVES, Desc: Pt has had a prior IVP dye reaction,(no scans in health system).Symptoms were hives,and airway obstruction!, Onset Date: 40981191   . Ace Inhibitors     REACTION: cyclic cough  . Losartan Potassium Other (See Comments)    Raises blood pressure    Past Surgical History  Procedure Date  . Gastric bypass 11/13/2009  . Exploratory laparotomy 2012  . Tmj- left--about 10 yrs ago--left side--pt now has some pain left jaw   . Abdominal hysterectomy 1988  . Laparoscopy 03/04/2012    Procedure: LAPAROSCOPY DIAGNOSTIC;  Surgeon: Valarie Merino, MD;  Location: [facility];  Service: General;  Laterality: N/A;  Resection of Candy Cane Roux en [name], MD No diagnosis found.  Doing well after resection of the candy cane portion of her Roux Y.  Taking peppermint capsules.  Her weight is up slightly.  Doing well.  Return 2 months.  Matt B. Daphine Deutscher, MD, [facility] [phone] beeper [phone]  03/24/2012 3:51 PM

## 2012-03-24 NOTE — Telephone Encounter (Signed)
Phone coverage this day...cleaning out inbasket 

## 2012-03-24 NOTE — Patient Instructions (Signed)
May return to exercise with BELT program.  Advance diet and explore foods as tolerated.

## 2012-04-15 ENCOUNTER — Telehealth (INDEPENDENT_AMBULATORY_CARE_PROVIDER_SITE_OTHER): Payer: Self-pay | Admitting: General Surgery

## 2012-04-15 NOTE — Telephone Encounter (Signed)
Pt calling for pain meds.  States she has "throbbing pain after eating" that lasts for 15 minutes to 2 hours.  If medication is prescribed, please call to [facility]:  [phone].  Please advise.

## 2012-04-15 NOTE — Telephone Encounter (Signed)
Called pt to pick up Rx for Roxicet elixir, # 300 ml, 5 ml Q 4 H prn pain, no refill, signed by Dr. Daphine Deutscher.

## 2012-05-05 ENCOUNTER — Telehealth (INDEPENDENT_AMBULATORY_CARE_PROVIDER_SITE_OTHER): Payer: Self-pay

## 2012-05-05 NOTE — Telephone Encounter (Signed)
Refill for Roxicet elixer 200cc written by Dr Daphine Deutscher and at front desk for pick up. Pt advised.

## 2012-05-05 NOTE — Telephone Encounter (Signed)
Pt calling stating she still has occasional abd pain and requests refill of her roxicet. Last refill 09-

## 2012-05-05 NOTE — Telephone Encounter (Signed)
Corrected msg from 05-05-12 note. Pt called stating she is still having occasional abd discomfort and requests refill of roxicet. Pt last refill 04-15-12. Pt advised I will send refill request to Dr Daphine Deutscher for review.

## 2012-05-18 ENCOUNTER — Encounter: Payer: Self-pay | Admitting: *Deleted

## 2012-05-18 ENCOUNTER — Encounter: Payer: 59 | Attending: Family Medicine | Admitting: *Deleted

## 2012-05-18 VITALS — Ht 66.0 in | Wt 128.7 lb

## 2012-05-18 DIAGNOSIS — Z713 Dietary counseling and surveillance: Secondary | ICD-10-CM | POA: Insufficient documentation

## 2012-05-18 DIAGNOSIS — R638 Other symptoms and signs concerning food and fluid intake: Secondary | ICD-10-CM | POA: Insufficient documentation

## 2012-05-18 DIAGNOSIS — N200 Calculus of kidney: Secondary | ICD-10-CM | POA: Insufficient documentation

## 2012-05-18 NOTE — Progress Notes (Addendum)
Medical Nutrition Therapy:  Appt start time: 1145   End time:  1230.  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 She is frustrated because most foods she can tolerate are on the list she must now avoid. Was given Low Oxalate Diet handout by urologist. Notes stomach "spasms" that are inconsistent with no trend in food pattern. Believes her problems to be related to IBS. States she wants to take medication she took in the past to help instead of narcotics (Roxicet, etc) to relieve pain. Plans to discuss with PCP.  Decreased fluid and protein intake noted. States water and shakes make her sick. Eats very small portions of foods.   Start weight: 299.0 lbs Weight today: 128.7 lbs  FLUIDS:  35-40 oz ("water and protein shakes make me sick") PROTEIN:  <60g (mainly edamame, beans, pork tenderloin, etc)  MEDICATIONS: See medication list. Reconciled with patient.   Progress Towards Goal(s):  In progress.   Nutritional Diagnosis:  Inadequate fluid intake related to past RYGB surgery evidenced by patient-reported fluid intake of only 50% of needs and a diagnosis of oxalate kidney stones.     Intervention:  Nutrition education.   Goals:  Limit your oxalate to 40-50 mg each day (see handout emailed to you for oxalate amounts in foods)  Increase fluid intake to 64oz +   Eat 3-6 small meals/snacks, every 3-5 hrs  Increase lean protein foods to meet 80g goal  Aim for >30 min of physical activity daily  Monitoring/Evaluation:  Dietary intake and body weight TBD. Patient will call for appointment if needed. Would like to try previous medication for IBS (could not recall name at visit) rather than narcotics to "calm" stomach.

## 2012-05-19 NOTE — Patient Instructions (Addendum)
Goals:  Limit your oxalate to 40-50 mg each day (see handout emailed to you for oxalate amounts in foods)  Increase fluid intake to 64oz +   Eat 3-6 small meals/snacks, every 3-5 hrs  Increase lean protein foods to meet 80g goal  Aim for >30 min of physical activity daily  Resume supplements daily or as instructed by MD.

## 2012-05-27 ENCOUNTER — Ambulatory Visit (INDEPENDENT_AMBULATORY_CARE_PROVIDER_SITE_OTHER): Payer: 59 | Admitting: Surgery

## 2012-06-02 ENCOUNTER — Telehealth (INDEPENDENT_AMBULATORY_CARE_PROVIDER_SITE_OTHER): Payer: Self-pay | Admitting: General Surgery

## 2012-06-02 NOTE — Telephone Encounter (Signed)
Patient requesting refill on Roxicet status post RNY candy cane limb removal 02/2012 with ongoing pain issues. Ok to refill roxicet per Dr Daphine Deutscher- written per Dr Daphine Deutscher- #30 with no refills. At front for patient pick up. LMOM making patient aware.

## 2012-07-01 ENCOUNTER — Telehealth (INDEPENDENT_AMBULATORY_CARE_PROVIDER_SITE_OTHER): Payer: Self-pay | Admitting: General Surgery

## 2012-07-01 NOTE — Telephone Encounter (Signed)
Pt called to say she is still experiencing intermittent pain, sometimes related to eating and sometimes not. She is currently taking Hydrocodone, but tablets are hard to swallow. She will need a refill by this weekend and she is requesting Roxicet instead of Hydrocodone. Also she was suppose to have an appointment made for sometime in October according to last office visit note. She needs a follow-up appointment with Dr. Daphine Deutscher ph- [phone]/gy

## 2012-07-01 NOTE — Telephone Encounter (Signed)
Spoke with patient and informed her that her Rx is at the front desk for her and that I have scheduled her a f/u appt w/ Dr. Daphine Deutscher on 1/3 at 4:30.  She said this was fine.

## 2012-07-03 ENCOUNTER — Telehealth (INDEPENDENT_AMBULATORY_CARE_PROVIDER_SITE_OTHER): Payer: Self-pay

## 2012-07-03 NOTE — Telephone Encounter (Signed)
Pt called stating she is having some difficulty when swallowing pills. Pt states water does not seem to help. Pt advised to try a small amount of milk when taking her hydrocodone and follow with as few small bites of apple sauce or yogurt. Pt states she will try this to see if it makes swallowing the hydrocodone easier. Pt advised if this does not help to let us know and we can review with Dr Daphine Deutscher for other options.

## 2012-07-21 ENCOUNTER — Telehealth (INDEPENDENT_AMBULATORY_CARE_PROVIDER_SITE_OTHER): Payer: Self-pay | Admitting: General Surgery

## 2012-07-21 NOTE — Telephone Encounter (Signed)
Patient called in stating she needed another prescription for the Roxicet. Pt stated is almost out and will be going out of town on Saturday 07/25/12 for three weeks and does not want to run out while out of town. Patient stated Dr. Daphine Deutscher has issued the prescription in the past due to her surgical history. I advised the patient her request would be forwarded and that Dr. Daphine Deutscher was in surgeries today, but the message would be sent. Patient agreed.

## 2012-07-23 ENCOUNTER — Telehealth (INDEPENDENT_AMBULATORY_CARE_PROVIDER_SITE_OTHER): Payer: Self-pay

## 2012-07-23 NOTE — Telephone Encounter (Signed)
Pt returned my call.  I let her know that her Rx will be up front waiting for her or her husband to pick up.

## 2012-07-23 NOTE — Telephone Encounter (Signed)
Returned pt's call regarding her Rx refill.  Pt was unavailable per her husband.  I asked if he could have her call the office back.  (Pt's Rx is up front waiting for pick-up)

## 2012-08-06 ENCOUNTER — Ambulatory Visit (INDEPENDENT_AMBULATORY_CARE_PROVIDER_SITE_OTHER): Payer: 59 | Admitting: Surgery

## 2012-08-14 ENCOUNTER — Encounter (INDEPENDENT_AMBULATORY_CARE_PROVIDER_SITE_OTHER): Payer: Self-pay | Admitting: Surgery

## 2012-08-14 ENCOUNTER — Ambulatory Visit (INDEPENDENT_AMBULATORY_CARE_PROVIDER_SITE_OTHER): Payer: 59 | Admitting: Surgery

## 2012-08-14 VITALS — BP 112/74 | HR 68 | Temp 97.4°F | Resp 14 | Ht 66.0 in | Wt 121.0 lb

## 2012-08-14 DIAGNOSIS — R109 Unspecified abdominal pain: Secondary | ICD-10-CM

## 2012-08-14 MED ORDER — OXYCODONE-ACETAMINOPHEN 5-500 MG PO CAPS: 1.0000 | ORAL_CAPSULE | ORAL | Status: DC | PRN

## 2012-08-14 NOTE — Patient Instructions (Signed)
Followup after CT enterography

## 2012-08-14 NOTE — Progress Notes (Signed)
Kari Gibbs 62 y.o.  Body mass index is 19.53 kg/(m^2).  Patient Active Problem List  Diagnosis  . CLOSTRIDIUM DIFFICILE COLITIS  . DEPRESSION  . HYPERTENSION  . ALLERGIC RHINITIS  . GERD  . OSTEOARTHRITIS  . 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 Abdominal pain  . 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 Iohexol      Code: HIVES, Desc: Pt has had a prior IVP dye reaction,(no scans in health system).Symptoms were hives,and airway obstruction!, Onset Date: 16109604   . Ace Inhibitors     REACTION: cyclic cough  . Losartan Potassium Other (See Comments)    Raises blood pressure; fatigue    Past Surgical History  Procedure Date  . Gastric bypass 11/13/2009  . Exploratory laparotomy 2012  . Tmj- left--about 10 yrs ago--left side--pt now has some pain left jaw   . Abdominal hysterectomy 1988  . Laparoscopy 03/04/2012    Procedure: LAPAROSCOPY DIAGNOSTIC;  Surgeon: Valarie Merino, MD;  Location: [facility];  Service: General;  Laterality: N/A;  Resection of Candy Cane Roux en [name], MD 1. Abdominal pain     Long discussion with [name] and her husband.  She has bandlike pain that can occur after eating. She has pain in her upper abdomen remained sore. Compression oftentimes helps the pain. We looked back and she's had normal endoscopies in the last time back to the operating room and resected the portion of her candycane that appeared redundant. It also taken down a lot of adhesions.  I wonder whether she could have be having problems with her JJ whether there some distal obstruction is intermittent. But we talk frankly about her use of oxycodone but this seems to be one thing that helps his pain which I am wondering it may be more of a bowel spasticity and this asked like a paregoric.  I want to arrange a CT enterography to look for any evidence of downstream obstruction. I will refill her Tylox and we'll proceed back  after the enterography. We got a develop a long-term strategy because her weight is down to 121 which means she is lost 162 pounds since her surgery. A little broader note she does tolerate all sorts of fruits such as strawberries blueberries peaches. She seems to tolerate well Cook high-quality beef also. Where she has continued to lose weight. I've encouraged her to try to drink some regular milk with cornbread. Matt B. Daphine Deutscher, MD, [facility] [phone] beeper [phone]  08/14/2012 5:18 PM

## 2012-08-17 ENCOUNTER — Telehealth (INDEPENDENT_AMBULATORY_CARE_PROVIDER_SITE_OTHER): Payer: Self-pay

## 2012-08-17 NOTE — Telephone Encounter (Signed)
Called pt to let her know of her appt date and time w/ [facility] - 08/19/12@230p  (arrive @130p )

## 2012-08-19 ENCOUNTER — Telehealth (INDEPENDENT_AMBULATORY_CARE_PROVIDER_SITE_OTHER): Payer: Self-pay

## 2012-08-19 ENCOUNTER — Inpatient Hospital Stay: Admission: RE | Admit: 2012-08-19 | Payer: 59 | Source: Ambulatory Visit

## 2012-08-19 NOTE — Telephone Encounter (Signed)
The pt is allergic to IV contrast.  She was scheduled for a ct enterography today.  They are going to cancel that.  She can be rescheduled with premedication or they recommend she can have mri enterography.  She has tolerated mri contrast before.  Please call, advise and reschedule.

## 2012-08-25 ENCOUNTER — Other Ambulatory Visit (INDEPENDENT_AMBULATORY_CARE_PROVIDER_SITE_OTHER): Payer: Self-pay

## 2012-08-25 DIAGNOSIS — R109 Unspecified abdominal pain: Secondary | ICD-10-CM

## 2012-08-27 ENCOUNTER — Telehealth (INDEPENDENT_AMBULATORY_CARE_PROVIDER_SITE_OTHER): Payer: Self-pay

## 2012-08-27 ENCOUNTER — Other Ambulatory Visit (INDEPENDENT_AMBULATORY_CARE_PROVIDER_SITE_OTHER): Payer: Self-pay

## 2012-08-27 DIAGNOSIS — R109 Unspecified abdominal pain: Secondary | ICD-10-CM

## 2012-08-27 NOTE — Telephone Encounter (Signed)
Called pt with her [facility] Enter[facility] appt date and time.  Lucia Bitter, Jan 22 @230p  at [address].)

## 2012-08-31 ENCOUNTER — Telehealth (INDEPENDENT_AMBULATORY_CARE_PROVIDER_SITE_OTHER): Payer: Self-pay | Admitting: General Surgery

## 2012-08-31 NOTE — Telephone Encounter (Signed)
Pt called to report that she has had a sinus infection and bronchitis for the last 2 weeks.  She was treated by her PCP with a Z-pack, cough medicine, Albuterol inhaler and Magic mouthwash.  Ever since this treatment she has no longer had any pain with eating or any other of the symptoms previously reported.  Asking if she still should follow through with the planned CT, given this new information.  Please call her with the advice of Dr. Daphine Deutscher.

## 2012-09-01 ENCOUNTER — Telehealth (INDEPENDENT_AMBULATORY_CARE_PROVIDER_SITE_OTHER): Payer: Self-pay

## 2012-09-01 NOTE — Telephone Encounter (Signed)
Called [facility] to cancel pt's scan set for tomorrow.

## 2012-09-01 NOTE — Telephone Encounter (Signed)
LMOM for pt, letting her know that I received her message about canceling her scan tomorrow.  I stated that I would call the facility and cancel per her request.

## 2012-09-01 NOTE — Telephone Encounter (Signed)
Pt wants to speak to [facility] about the test scheduled for 1/22 at [facility] b/c she is wanting to get it canceled. The pt does not have the abdominal pain anymore and she thinks it will be a waist of money to get this test. Pls call her ASAP to cancel the test.

## 2012-09-02 ENCOUNTER — Other Ambulatory Visit: Payer: 59

## 2012-09-26 ENCOUNTER — Other Ambulatory Visit: Payer: Self-pay

## 2012-10-05 ENCOUNTER — Other Ambulatory Visit (INDEPENDENT_AMBULATORY_CARE_PROVIDER_SITE_OTHER): Payer: Self-pay | Admitting: Surgery

## 2012-10-05 ENCOUNTER — Encounter (INDEPENDENT_AMBULATORY_CARE_PROVIDER_SITE_OTHER): Payer: Self-pay | Admitting: Surgery

## 2012-10-08 ENCOUNTER — Telehealth (INDEPENDENT_AMBULATORY_CARE_PROVIDER_SITE_OTHER): Payer: Self-pay

## 2012-10-08 NOTE — Telephone Encounter (Signed)
ERROR

## 2012-10-12 ENCOUNTER — Telehealth (INDEPENDENT_AMBULATORY_CARE_PROVIDER_SITE_OTHER): Payer: Self-pay | Admitting: General Surgery

## 2012-10-12 ENCOUNTER — Other Ambulatory Visit (INDEPENDENT_AMBULATORY_CARE_PROVIDER_SITE_OTHER): Payer: Self-pay

## 2012-10-12 ENCOUNTER — Encounter (INDEPENDENT_AMBULATORY_CARE_PROVIDER_SITE_OTHER): Payer: Self-pay | Admitting: Surgery

## 2012-10-12 ENCOUNTER — Ambulatory Visit (INDEPENDENT_AMBULATORY_CARE_PROVIDER_SITE_OTHER): Payer: 59 | Admitting: Surgery

## 2012-10-12 VITALS — BP 144/80 | HR 72 | Temp 97.9°F | Resp 16 | Ht 66.0 in | Wt 119.6 lb

## 2012-10-12 DIAGNOSIS — Z9884 Bariatric surgery status: Secondary | ICD-10-CM

## 2012-10-12 LAB — LIPASE: Lipase: 22 U/L (ref 0–75)

## 2012-10-12 MED ORDER — OXYCODONE-ACETAMINOPHEN 5-500 MG PO CAPS: 1.0000 | ORAL_CAPSULE | ORAL | Status: DC | PRN

## 2012-10-12 NOTE — Progress Notes (Signed)
[name] 62 y.o.  Body mass index is 19.31 kg/(m^2).  Patient Active Problem List  Diagnosis  . CLOSTRIDIUM DIFFICILE COLITIS  . DEPRESSION  . HYPERTENSION  . ALLERGIC RHINITIS  . GERD  . OSTEOARTHRITIS  . 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 Abdominal pain  . 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 Iohexol      Code: HIVES, Desc: Pt has had a prior IVP dye reaction,(no scans in health system).Symptoms were hives,and airway obstruction!, Onset Date: 44010272   . Ace Inhibitors     REACTION: cyclic cough  . Losartan Potassium Other (See Comments)    Raises blood pressure; fatigue    Past Surgical History  Procedure Laterality Date  . Gastric bypass  11/13/2009  . Exploratory laparotomy  2012  . Tmj- left--about 10 yrs ago--left side--pt now has some pain left jaw    . Abdominal hysterectomy  1988  . Laparoscopy  03/04/2012    Procedure: LAPAROSCOPY DIAGNOSTIC;  Surgeon: Valarie Merino, MD;  Location: [facility];  Service: General;  Laterality: N/A;  Resection of Candy Cane Roux en [name], MD 1. Status post gastric bypass for obesity     Kari Gibbs and her husband came in today in followup. She had a bad upper respiratory tract infection in January and took miracle mouthwash along with different antibiotics and during that period of time had no abdominal pain nor did she have any difficulty with eating anything. Since this is resolved she is once again having midabdominal pain radiates into her back.  I discussed her case with Dr. Durward Mallard and we are going to refer her back to Dr. Charlott Rakes who performed a colonoscopy on her and 2010. In the meantime because of the nature of her pain we will obtain a serum lipase to see if this could be related to pancreatic inflammation. I'm otherwise a loss to explain his recurrent pain. I have recognized a narcotic dependence that she is trying actively to get off of  her Roxicet. I will refer her to Doy Mince and see her back in about 6 weeks.  She requested a refill on the Tylox and we discuss her pain management issues and she doesn't take it unless she needs it.  She is trying not to take these pain meds.  It may be that the opioids are acting as an antispasmodic.  Matt B. Daphine Deutscher, MD, [facility] [phone] beeper [phone]  10/12/2012 9:36 AM

## 2012-10-12 NOTE — Telephone Encounter (Signed)
Pt called from [facility], stating Dr. Daphine Deutscher wrote her prescription for Roxicet 5/500 mg and it is not available any longer.  Spoke with the pharmacist, who will not take a "verbal order" to change the acetamenaphen portion of it to 325 mg, but wants a new Rx altogether.  Dr. Daphine Deutscher issued new script and pt will pick it up.

## 2012-10-12 NOTE — Patient Instructions (Signed)
Appointment with Dr. Charlott Rakes Have serum lipase drawn today.

## 2012-10-27 ENCOUNTER — Other Ambulatory Visit: Payer: Self-pay | Admitting: Gastroenterology

## 2012-10-27 ENCOUNTER — Telehealth (INDEPENDENT_AMBULATORY_CARE_PROVIDER_SITE_OTHER): Payer: Self-pay | Admitting: General Surgery

## 2012-10-27 DIAGNOSIS — R109 Unspecified abdominal pain: Secondary | ICD-10-CM

## 2012-10-27 NOTE — Telephone Encounter (Signed)
Pt walked in to the front desk, asking for additional Percocet.  Offered Hydrocodone 5/325 mg, but she wants more of the Percocet.  Advised her we will ask Dr. Daphine Deutscher and call her later with his response.  If he OKs the refill, she will need to come back in to pick up the prescription, as it cannot be called in.  She understands.

## 2012-11-05 ENCOUNTER — Ambulatory Visit
Admission: RE | Admit: 2012-11-05 | Discharge: 2012-11-05 | Disposition: A | Discharge status: Home / Self Care | Payer: 59 | Source: Ambulatory Visit | Attending: Gastroenterology | Admitting: Gastroenterology

## 2012-11-05 DIAGNOSIS — R109 Unspecified abdominal pain: Secondary | ICD-10-CM

## 2012-11-11 ENCOUNTER — Telehealth (INDEPENDENT_AMBULATORY_CARE_PROVIDER_SITE_OTHER): Payer: Self-pay

## 2012-11-11 NOTE — Telephone Encounter (Signed)
Patient notified that her refill request for oxycodone has been denied per Dr. Magnus Ivan.

## 2012-11-11 NOTE — Telephone Encounter (Addendum)
Patient calling requesting a refill on her pain medication (oxycodone 5/325mg ) patient states she has an ulcer and Dr. Daphine Deutscher has been prescribing her pain medication for this.  Patient last refill documented in EPIC was on 10/12/12.  (s/p Gastric By-Pass on 11/13/2009) Dr. Daphine Deutscher not available

## 2012-11-11 NOTE — Telephone Encounter (Signed)
i do not feel comfortable writing her for narcotics given his most recent note in epic

## 2012-11-13 ENCOUNTER — Telehealth (INDEPENDENT_AMBULATORY_CARE_PROVIDER_SITE_OTHER): Payer: Self-pay

## 2012-11-13 NOTE — Telephone Encounter (Signed)
The patient called regarding her pain medicine.  She was just declined a refill by Dr Magnus Ivan in Dr Ermalene Searing absence.  She is now asking for a lower dose if they will write for that.  She says otherwise she will only be able to eat fruit.  I do not feel another md will write for her medicine.  I know Dr Maisie Fus has declined before.  I will send a message to Meagen to have her see what she can work out since she works with Dr Daphine Deutscher.

## 2012-11-13 NOTE — Telephone Encounter (Signed)
Pt's husband called regarding pt's pain medication.  Stated that pt is "doubling over".  I explained that we have asked several physicians at [facility] if they would refill the Rx since MM is out of offic and that all physician's declined to refill due to MM's last office note.  He began to yell over me about how he's "glad she isn't dying because we don't give a damn about her well being."  He then asked if we would be willing to write an rx for a lesser dosage.  I explained that this has been offered to the pt numerous times and she refused any lesser dosage.  He then told me that I needed to call MM right away because he will not go through the weekend with his wife in this pain.  I let him know that MM is "Unreachable" at the moment, to which he laughed and said "I know someone there has a way of getting in touch with him.  No one is never unreachable these days." I told him that what that means is that we ([facility] employees) are NOT to contact the Dr.  He then said "well let me call him."  To which, I laughed and said "Sir, we cannot give out anyone's personal number".  Husband then made comments to the effect that none of the physicians here with the exception of Dr. Daphine Deutscher "aren't worth a damn" and "uncarring" to patients.  Asked that I have MM call him on his cell phone Monday.  I said I would pass the message along to MM, but that he might not be able to get back to him until after hours on Monday.

## 2012-11-13 NOTE — Telephone Encounter (Signed)
Pt's husband called back asking if anyone in our office has seen the results of his wife's recent UGI.  I looked in her chart, it was performed on 3/27, and I noticed that it was ordered by Dr. Loreta Ave.  I let him know that since MM was not the ordering physician, that he probably doesn't even know that the pt had a UGI done.  He understood, then apologized to me "not for what he said, but for how he said it".  I appreciated his apology and let him know that I understood his frustration and that I would probably be feeling the same way if the shoe were on the other foot.  I let him know that I would give a copy of his wife's UGI to MM on Monday when he is in the office.

## 2012-11-16 ENCOUNTER — Telehealth (INDEPENDENT_AMBULATORY_CARE_PROVIDER_SITE_OTHER): Payer: Self-pay

## 2012-11-16 NOTE — Telephone Encounter (Signed)
Called pt to let her know that MM has written a Rx for her (percocet 5-325) and that she can pick it up from our front desk at her convenience.  Pt stated that she is not an "addict" and that she wanted MM to know that she had 2 of her pills over the weekend in her possession that she did not take to prove that she is not addicted.  I explained that the word "addict" is not noted in her chart, but that MM is concerned with the amount of pain meds she is taking and the short time frames she is needing refills.  I also let her know that MM is wanting to find another form of pain management for her.  Pt then inquired about her recent scan.  (Pt has a small hiatal hernia)  She asked if her old hernia came back or if this is a new one.  I told her that I did not know, as I am not a physician, but that I would have MM look over everything and will get back in touch with her.

## 2012-12-15 ENCOUNTER — Telehealth (INDEPENDENT_AMBULATORY_CARE_PROVIDER_SITE_OTHER): Payer: Self-pay | Admitting: General Surgery

## 2012-12-15 NOTE — Telephone Encounter (Signed)
Patient called stating that she was still having pain a pressure in abd and esophagus area . Wanted to know if we could call her in another RX for pain medication the last one was filled on 11/13/12. She has been referred back to Dr Bosie Clos  She was advised to contact him for this issue at this time since he is following her for the abd issues . She has an appt with Dr Daphine Deutscher on 12/18/12

## 2012-12-18 ENCOUNTER — Encounter (INDEPENDENT_AMBULATORY_CARE_PROVIDER_SITE_OTHER): Payer: 59 | Admitting: Surgery

## 2013-01-20 ENCOUNTER — Encounter (INDEPENDENT_AMBULATORY_CARE_PROVIDER_SITE_OTHER): Payer: Self-pay | Admitting: Surgery

## 2013-01-20 ENCOUNTER — Encounter (INDEPENDENT_AMBULATORY_CARE_PROVIDER_SITE_OTHER): Payer: Self-pay

## 2013-01-20 ENCOUNTER — Ambulatory Visit (INDEPENDENT_AMBULATORY_CARE_PROVIDER_SITE_OTHER): Payer: 59 | Admitting: Surgery

## 2013-01-20 VITALS — BP 128/78 | HR 96 | Temp 98.7°F | Resp 14 | Ht 66.0 in | Wt 128.8 lb

## 2013-01-20 DIAGNOSIS — R1012 Left upper quadrant pain: Secondary | ICD-10-CM

## 2013-01-20 MED ORDER — OXYCODONE-ACETAMINOPHEN 5-325 MG PO TABS: 1.0000 | ORAL_TABLET | ORAL | Status: DC | PRN

## 2013-01-20 NOTE — Progress Notes (Signed)
Kari Gibbs 62 y.o.  Body mass index is 20.8 kg/(m^2).  Patient Active Problem List   Diagnosis Date Noted  . 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 Abdominal pain 01/15/2012  . CLOSTRIDIUM DIFFICILE COLITIS 05/01/2010  . DEPRESSION 05/01/2010  . HYPERTENSION 05/01/2010  . GERD 05/01/2010  . OSTEOARTHRITIS 05/01/2010  . 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 ALLERGIC RHINITIS 06/26/2007    Allergies  Allergen Reactions  . Iohexol      Code: HIVES, Desc: Pt has had a prior IVP dye reaction,(no scans in health system).Symptoms were hives,and airway obstruction!, Onset Date: 16109604   . Ace Inhibitors     REACTION: cyclic cough  . Losartan Potassium Other (See Comments)    Raises blood pressure; fatigue    Past Surgical History  Procedure Laterality Date  . Gastric bypass  11/13/2009  . Exploratory laparotomy  2012  . Tmj- left--about 10 yrs ago--left side--pt now has some pain left jaw    . Abdominal hysterectomy  1988  . Laparoscopy  03/04/2012    Procedure: LAPAROSCOPY DIAGNOSTIC;  Surgeon: Valarie Merino, MD;  Location: [facility];  Service: General;  Laterality: N/A;  Resection of Candy Cane Roux en [name], MD 1. Abdominal pain, left upper quadrant     Postop long-term visit now with left upper quadrant abdominal pain after eating proteins. She ate proteins at about 2:00 and is still having a throbbing intense pain in her left upper quadrant. This may be in her [name] limb. It is not clear to me. Her constipation is better but that may be because she's not taking any narcotics. Since she is having severe pain she will requested something for pain on and gave her prescription for Roxicet with 5 mg of oxycodone.  I'm going to need to discuss her with my partners to determine whether we think that a repeat laparoscopy is in order to once again reexamined her [name] limb and also look at her [name], MD,  [facility] [phone] beeper [phone]  01/20/2013 5:42 PM

## 2013-01-20 NOTE — Patient Instructions (Signed)
Call us if you haven't heard anything by next Monday.

## 2013-01-25 ENCOUNTER — Other Ambulatory Visit (INDEPENDENT_AMBULATORY_CARE_PROVIDER_SITE_OTHER): Payer: Self-pay

## 2013-01-25 ENCOUNTER — Telehealth (INDEPENDENT_AMBULATORY_CARE_PROVIDER_SITE_OTHER): Payer: Self-pay

## 2013-01-25 DIAGNOSIS — K8020 Calculus of gallbladder without cholecystitis without obstruction: Secondary | ICD-10-CM

## 2013-01-25 NOTE — Telephone Encounter (Signed)
Spoke with pt - making her aware of her appts that Dr. Daphine Deutscher had me place for her;  6/17 - CT 6/25 - Hida scan  Pt agreed that both appt dates and time work for her.  Pt read back appt times to me as confirmation.

## 2013-01-26 ENCOUNTER — Ambulatory Visit
Admission: RE | Admit: 2013-01-26 | Discharge: 2013-01-26 | Disposition: A | Discharge status: Home / Self Care | Payer: 59 | Source: Ambulatory Visit | Attending: Surgery | Admitting: Surgery

## 2013-01-26 DIAGNOSIS — K8020 Calculus of gallbladder without cholecystitis without obstruction: Secondary | ICD-10-CM

## 2013-01-27 ENCOUNTER — Other Ambulatory Visit (INDEPENDENT_AMBULATORY_CARE_PROVIDER_SITE_OTHER): Payer: Self-pay

## 2013-01-27 ENCOUNTER — Telehealth (INDEPENDENT_AMBULATORY_CARE_PROVIDER_SITE_OTHER): Payer: Self-pay | Admitting: General Surgery

## 2013-01-27 DIAGNOSIS — K8020 Calculus of gallbladder without cholecystitis without obstruction: Secondary | ICD-10-CM

## 2013-01-27 NOTE — Telephone Encounter (Signed)
US abdomen set up for 01/29/2013 @ 8:30 am at [facility] [address]. Patient made aware. She asked if she should keep her appt for her HIDA scan on 02/03/2013. I told her I would get a message to Dr Daphine Deutscher to advise. I advised her that this would probably be based on what the ultrasound shows but that Dr Ermalene Searing assistant will let her know. She will call with any other questions.

## 2013-01-29 ENCOUNTER — Other Ambulatory Visit: Payer: 59

## 2013-02-01 ENCOUNTER — Ambulatory Visit
Admission: RE | Admit: 2013-02-01 | Discharge: 2013-02-01 | Disposition: A | Discharge status: Home / Self Care | Payer: 59 | Source: Ambulatory Visit | Attending: Surgery | Admitting: Surgery

## 2013-02-01 DIAGNOSIS — K8020 Calculus of gallbladder without cholecystitis without obstruction: Secondary | ICD-10-CM

## 2013-02-02 ENCOUNTER — Telehealth (INDEPENDENT_AMBULATORY_CARE_PROVIDER_SITE_OTHER): Payer: Self-pay

## 2013-02-02 NOTE — Telephone Encounter (Signed)
Patient is asking for [place] results; Informed her Gallbladder is negative for stones. Patient is asking should she still have the [place] Hepato scan 02/03/13 Please advise.

## 2013-02-02 NOTE — Telephone Encounter (Signed)
Patient was advise she is to have her Hepatic/liver scan 02/03/13 as ordered. Patient verbalized understanding

## 2013-02-03 ENCOUNTER — Encounter (HOSPITAL_COMMUNITY): Payer: Self-pay

## 2013-02-03 ENCOUNTER — Telehealth (INDEPENDENT_AMBULATORY_CARE_PROVIDER_SITE_OTHER): Payer: Self-pay

## 2013-02-03 ENCOUNTER — Encounter (HOSPITAL_COMMUNITY)
Admission: RE | Admit: 2013-02-03 | Discharge: 2013-02-03 | Disposition: A | Discharge status: Home / Self Care | Payer: 59 | Source: Ambulatory Visit | Attending: Surgery | Admitting: Surgery

## 2013-02-03 DIAGNOSIS — R109 Unspecified abdominal pain: Secondary | ICD-10-CM | POA: Insufficient documentation

## 2013-02-03 DIAGNOSIS — K8020 Calculus of gallbladder without cholecystitis without obstruction: Secondary | ICD-10-CM

## 2013-02-03 MED ORDER — TECHNETIUM TC 99M MEBROFENIN IV KIT
5.5000 | PACK | Freq: Once | INTRAVENOUS | Status: AC | PRN
  Administered 2013-02-03: 5.5 via INTRAVENOUS

## 2013-02-03 NOTE — Telephone Encounter (Signed)
Patient requesting refill of roxicet 5/325. She will run out early next week. She wants to pick up the prescription on Friday. States she has her hepato test today and had some discomfort during and after test. Told her he will review this message tomorrow and his assistant will call her and let her know.

## 2013-02-05 ENCOUNTER — Telehealth (INDEPENDENT_AMBULATORY_CARE_PROVIDER_SITE_OTHER): Payer: Self-pay

## 2013-02-05 NOTE — Telephone Encounter (Signed)
(  continuation of last note)  LMOM for pt letting her know that Dr. Daphine Deutscher has written an Rx for her (vicoden, #60 no refills) and that it is at our front desk waiting for her to pick up.

## 2013-02-05 NOTE — Telephone Encounter (Signed)
Spoke with pt - giving her hepato results (normal - no explanation for pt's pain) also scheduled pt to come into the office to see Dr. Daphine Deutscher on Wed. July 9 @ 230pm.

## 2013-02-05 NOTE — Telephone Encounter (Signed)
LMOM for pt.  

## 2013-02-05 NOTE — Telephone Encounter (Signed)
Pt calling for results of hida scan.  Message relayed to Dr. Daphine Deutscher via his nurse Meagen.

## 2013-02-17 ENCOUNTER — Encounter (INDEPENDENT_AMBULATORY_CARE_PROVIDER_SITE_OTHER): Payer: Self-pay | Admitting: Surgery

## 2013-02-17 ENCOUNTER — Ambulatory Visit (INDEPENDENT_AMBULATORY_CARE_PROVIDER_SITE_OTHER): Payer: 59 | Admitting: Surgery

## 2013-02-17 VITALS — BP 122/76 | HR 72 | Resp 16 | Ht 66.0 in | Wt 130.2 lb

## 2013-02-17 DIAGNOSIS — R109 Unspecified abdominal pain: Secondary | ICD-10-CM

## 2013-02-17 NOTE — Progress Notes (Signed)
Followup visit after nuclear medicine study. When she had the HIDA scan she experienced pain after taking the fatty meal. However her ejection fraction was around 50% which is normal. She describes as cramping pains that occur after certain foods typically fatty foods. I told her that this might be her gallbladder although the recent ultrasound did not show stones there was a CT scan done back in 2013 suggested that she might have stones.  She is being judicious in her use of pain meds and still has about half of her prescription for Percocet. I will give her some information on cholecystectomy and will be lying on her to let me know when the pain is to the point where she is ready to have her gallbladder out. We discussed the downside of cholecystectomy and certainly no guarantees and taken her gallbladder would alleviate her pain.  Will see her back in 4 weeks

## 2013-02-17 NOTE — Patient Instructions (Signed)
Laparoscopic Cholecystectomy Laparoscopic cholecystectomy is surgery to remove the gallbladder. The gallbladder is located slightly to the right of center in the abdomen, behind the liver. It is a concentrating and storage sac for the bile produced in the liver. Bile aids in the digestion and absorption of fats. Gallbladder disease (cholecystitis) is an inflammation of your gallbladder. This condition is usually caused by a buildup of gallstones (cholelithiasis) in your gallbladder. Gallstones can block the flow of bile, resulting in inflammation and pain. In severe cases, emergency surgery may be required. When emergency surgery is not required, you will have time to prepare for the procedure. Laparoscopic surgery is an alternative to open surgery. Laparoscopic surgery usually has a shorter recovery time. Your common bile duct may also need to be examined and explored. Your caregiver will discuss this with you if he or she feels this should be done. If stones are found in the common bile duct, they may be removed. LET YOUR CAREGIVER KNOW ABOUT:  Allergies to food or medicine.  Medicines taken, including vitamins, herbs, eyedrops, over-the-counter medicines, and creams.  Use of steroids (by mouth or creams).  Previous problems with anesthetics or numbing medicines.  History of bleeding problems or blood clots.  Previous surgery.  Other health problems, including diabetes and kidney problems.  Possibility of pregnancy, if this applies. RISKS AND COMPLICATIONS All surgery is associated with risks. Some problems that may occur following this procedure include:  Infection.  Damage to the common bile duct, nerves, arteries, veins, or other internal organs such as the stomach or intestines.  Bleeding.  A stone may remain in the common bile duct. BEFORE THE PROCEDURE  Do not take aspirin for 3 days prior to surgery or blood thinners for 1 week prior to surgery.  Do not eat or drink  anything after midnight the night before surgery.  Let your caregiver know if you develop a cold or other infectious problem prior to surgery.  You should be present 60 minutes before the procedure or as directed. PROCEDURE  You will be given medicine that makes you sleep (general anesthetic). When you are asleep, your surgeon will make several small cuts (incisions) in your abdomen. One of these incisions is used to insert a small, lighted scope (laparoscope) into the abdomen. The laparoscope helps the surgeon see into your abdomen. Carbon dioxide gas will be pumped into your abdomen. The gas allows more room for the surgeon to perform your surgery. Other operating instruments are inserted through the other incisions. Laparoscopic procedures may not be appropriate when:  There is major scarring from previous surgery.  The gallbladder is extremely inflamed.  There are bleeding disorders or unexpected cirrhosis of the liver.  A pregnancy is near term.  Other conditions make the laparoscopic procedure impossible. If your surgeon feels it is not safe to continue with a laparoscopic procedure, he or she will perform an open abdominal procedure. In this case, the surgeon will make an incision to open the abdomen. This gives the surgeon a larger view and field to work within. This may allow the surgeon to perform procedures that sometimes cannot be performed with a laparoscope alone. Open surgery has a longer recovery time. AFTER THE PROCEDURE  You will be taken to the recovery area where a nurse will watch and check your progress.  You may be allowed to go home the same day.  Do not resume physical activities until directed by your caregiver.  You may resume a normal diet and   activities as directed. Document Released: 07/29/2005 Document Revised: 10/21/2011 Document Reviewed: 01/11/2011 [facility] Patient Information 2014 [place], [place].  Laparoscopic Cholecystectomy Care After Refer to  this sheet in the next few weeks. These instructions provide you with information on caring for yourself after your procedure. Your caregiver may also give you more specific instructions. Your treatment has been planned according to current medical practices, but problems sometimes occur. Call your caregiver if you have any problems or questions after your procedure. HOME CARE INSTRUCTIONS   Change bandages (dressings) as directed by your caregiver.  Keep the wound dry and clean. The wound may be washed gently with soap and water. Gently blot or dab the area dry.  Do not take baths or use swimming pools or hot tubs for 10 days, or as instructed by your caregiver.  Only take over-the-counter or prescription medicines for pain, discomfort, or fever as directed by your caregiver.  Continue your normal diet as directed by your caregiver.  Do not lift anything heavier than 25 pounds (11.5 kg), or as directed by your caregiver.  Do not play contact sports for 1 week, or as directed by your caregiver. SEEK MEDICAL CARE IF:   There is redness, swelling, or increasing pain in the wound.  You notice yellowish-white fluid (pus) coming from the wound.  There is drainage from the wound that lasts longer than 1 day.  There is a bad smell coming from the wound or dressing.  The surgical cut (incision) breaks open. SEEK IMMEDIATE MEDICAL CARE IF:   You develop a rash.  You have difficulty breathing.  You develop chest pain.  You develop any reaction or side effects to medicines given.  You have a fever.  You have increasing pain in the shoulders (shoulder strap areas).  You have dizzy episodes or faint while standing.  You develop severe abdominal pain.  You feel sick to your stomach (nauseous) or throw up (vomit) and this lasts for more than 1 day. MAKE SURE YOU:   Understand these instructions.  Will watch your condition.  Will get help right away if you are not doing well or  get worse. Document Released: 07/29/2005 Document Revised: 10/21/2011 Document Reviewed: 01/11/2011 [facility] Patient Information 2014 [place], [place].

## 2013-02-24 ENCOUNTER — Other Ambulatory Visit (INDEPENDENT_AMBULATORY_CARE_PROVIDER_SITE_OTHER): Payer: Self-pay | Admitting: Surgery

## 2013-02-24 DIAGNOSIS — R1012 Left upper quadrant pain: Secondary | ICD-10-CM

## 2013-02-24 MED ORDER — OXYCODONE-ACETAMINOPHEN 5-325 MG PO TABS: 1.0000 | ORAL_TABLET | ORAL | Status: DC | PRN

## 2013-03-26 ENCOUNTER — Ambulatory Visit (INDEPENDENT_AMBULATORY_CARE_PROVIDER_SITE_OTHER): Payer: 59 | Admitting: Surgery

## 2013-03-26 ENCOUNTER — Encounter (INDEPENDENT_AMBULATORY_CARE_PROVIDER_SITE_OTHER): Payer: Self-pay | Admitting: Surgery

## 2013-03-26 VITALS — BP 130/78 | HR 74 | Resp 14 | Ht 66.0 in | Wt 131.8 lb

## 2013-03-26 DIAGNOSIS — R109 Unspecified abdominal pain: Secondary | ICD-10-CM

## 2013-03-26 DIAGNOSIS — R1013 Epigastric pain: Secondary | ICD-10-CM

## 2013-03-26 DIAGNOSIS — R1012 Left upper quadrant pain: Secondary | ICD-10-CM

## 2013-03-26 MED ORDER — OXYCODONE-ACETAMINOPHEN 5-325 MG PO TABS: 1.0000 | ORAL_TABLET | ORAL | Status: DC | PRN

## 2013-03-26 NOTE — Progress Notes (Signed)
Selda and her husband came in today discuss her continued pain. One thing that she has been having recently R. Attacks that she describes as a flushing and feeling like she is in the past and in a diarrheic stool. She said that happened about 6 times. Other than that she continues to have midepigastric pain that radiates into her back.  She is frustrated and I am frustrated and not been able to find an effective treatment. She continues to take Percocet for pain and she and her husband recognize the risk of addiction and she is very judicious and use of this. I will discuss this with the bariatric partners but the only option that I see is to perform repeat laparoscopy with probable cholecystectomy with intraoperative cholangiogram to look at her distal common duct and possibly a pancreatic duct and to run her Roux limb down to her JJ and then run her bowel loops in their entirety.  They needed to wait left her labor day to schedule anything. Her weight today is 131.8 her vitals are normal with a normal blood pressure. I will go ahead and see about scheduling something probably after Labor Day.

## 2013-03-26 NOTE — Patient Instructions (Addendum)
Thanks for your patience.  If you need further assistance after leaving the office, please call our office and speak with a [facility] nurse.  [phone].  If you want to leave a message for Dr. Daphine Deutscher, please call his office phone at [phone].  Laparoscopy with probable cholecystectomy and intraoperative cholangiogram (look at the common duct and distal pancreatic duct).   Upper endoscopy and proximal enterolysis.   Examinination of the jejunojejunostomy and distal bowel.

## 2013-04-08 ENCOUNTER — Encounter (INDEPENDENT_AMBULATORY_CARE_PROVIDER_SITE_OTHER): Payer: Self-pay | Admitting: Surgery

## 2013-04-08 ENCOUNTER — Telehealth (INDEPENDENT_AMBULATORY_CARE_PROVIDER_SITE_OTHER): Payer: Self-pay | Admitting: Surgery

## 2013-04-08 ENCOUNTER — Other Ambulatory Visit (INDEPENDENT_AMBULATORY_CARE_PROVIDER_SITE_OTHER): Payer: Self-pay | Admitting: Surgery

## 2013-04-08 NOTE — Telephone Encounter (Signed)
Called in for sx date after labor day. No orders in system for surgery

## 2013-04-21 ENCOUNTER — Other Ambulatory Visit (INDEPENDENT_AMBULATORY_CARE_PROVIDER_SITE_OTHER): Payer: Self-pay | Admitting: Surgery

## 2013-04-22 ENCOUNTER — Telehealth (INDEPENDENT_AMBULATORY_CARE_PROVIDER_SITE_OTHER): Payer: Self-pay | Admitting: General Surgery

## 2013-04-22 NOTE — Telephone Encounter (Signed)
Please call Kari Gibbs she will be needing a Rx for her pain med. Please call back on [phone]

## 2013-04-23 ENCOUNTER — Other Ambulatory Visit (INDEPENDENT_AMBULATORY_CARE_PROVIDER_SITE_OTHER): Payer: Self-pay | Admitting: Surgery

## 2013-04-23 ENCOUNTER — Telehealth (INDEPENDENT_AMBULATORY_CARE_PROVIDER_SITE_OTHER): Payer: Self-pay

## 2013-04-23 DIAGNOSIS — R1012 Left upper quadrant pain: Secondary | ICD-10-CM

## 2013-04-23 MED ORDER — OXYCODONE-ACETAMINOPHEN 5-325 MG PO TABS: 1.0000 | ORAL_TABLET | ORAL | Status: DC | PRN

## 2013-04-23 NOTE — Telephone Encounter (Signed)
Called pt to let her know that her Rx for oxycodone-acetaminpohen has been written and is waiting at the front desk for her to pick up.

## 2013-05-04 ENCOUNTER — Encounter (INDEPENDENT_AMBULATORY_CARE_PROVIDER_SITE_OTHER): Payer: Self-pay | Admitting: Surgery

## 2013-05-05 ENCOUNTER — Encounter (HOSPITAL_COMMUNITY): Payer: Self-pay | Admitting: Pharmacy Technician

## 2013-05-05 ENCOUNTER — Other Ambulatory Visit (INDEPENDENT_AMBULATORY_CARE_PROVIDER_SITE_OTHER): Payer: Self-pay

## 2013-05-05 DIAGNOSIS — K829 Disease of gallbladder, unspecified: Secondary | ICD-10-CM

## 2013-05-05 MED ORDER — SUCRALFATE 1 GM/10ML PO SUSP: 1.0000 g | Freq: Four times a day (QID) | ORAL | Status: DC

## 2013-05-05 MED ORDER — PANTOPRAZOLE SODIUM 40 MG PO TBEC: 40.0000 mg | DELAYED_RELEASE_TABLET | Freq: Every day | ORAL | Status: DC

## 2013-05-10 ENCOUNTER — Telehealth (INDEPENDENT_AMBULATORY_CARE_PROVIDER_SITE_OTHER): Payer: Self-pay | Admitting: *Deleted

## 2013-05-10 ENCOUNTER — Other Ambulatory Visit (INDEPENDENT_AMBULATORY_CARE_PROVIDER_SITE_OTHER): Payer: Self-pay | Admitting: *Deleted

## 2013-05-10 DIAGNOSIS — R1012 Left upper quadrant pain: Secondary | ICD-10-CM

## 2013-05-10 MED ORDER — OXYCODONE-ACETAMINOPHEN 5-325 MG PO TABS: 1.0000 | ORAL_TABLET | ORAL | Status: DC | PRN

## 2013-05-10 NOTE — Telephone Encounter (Signed)
Patient called in this morning and then back in this afternoon asking for a refill of her Percocet.  Patient states she is taking as little as possible but still having pain control issues when she doesn't take it.  Patient last received Percocet #40 on 04/23/13.  Meagen CMA text Dr. Daphine Deutscher who approved a refill at this time.  Due to Dr. Daphine Deutscher being at the hospital Dr. Maisie Fus went ahead and signed prescription for: Percocet 5/325mg  Take 1 tablet every 4 hours as needed for pain #40 no refills.  Patient updated that prescription is at the front desk awaiting pickup.

## 2013-05-11 ENCOUNTER — Ambulatory Visit (HOSPITAL_COMMUNITY)
Admission: RE | Admit: 2013-05-11 | Discharge: 2013-05-11 | Disposition: A | Discharge status: Home / Self Care | Payer: 59 | Source: Ambulatory Visit | Attending: Surgery | Admitting: Surgery

## 2013-05-11 ENCOUNTER — Encounter (HOSPITAL_COMMUNITY): Payer: Self-pay

## 2013-05-11 ENCOUNTER — Encounter (HOSPITAL_COMMUNITY)
Admission: RE | Admit: 2013-05-11 | Discharge: 2013-05-11 | Disposition: A | Discharge status: Home / Self Care | Payer: 59 | Source: Ambulatory Visit | Attending: Surgery | Admitting: Surgery

## 2013-05-11 DIAGNOSIS — Z01810 Encounter for preprocedural cardiovascular examination: Secondary | ICD-10-CM | POA: Insufficient documentation

## 2013-05-11 DIAGNOSIS — Z01818 Encounter for other preprocedural examination: Secondary | ICD-10-CM | POA: Insufficient documentation

## 2013-05-11 DIAGNOSIS — Z01812 Encounter for preprocedural laboratory examination: Secondary | ICD-10-CM | POA: Insufficient documentation

## 2013-05-11 DIAGNOSIS — I10 Essential (primary) hypertension: Secondary | ICD-10-CM | POA: Insufficient documentation

## 2013-05-11 HISTORY — DX: Personal history of urinary calculi: Z87.442

## 2013-05-11 HISTORY — DX: Anxiety disorder, unspecified: F41.9

## 2013-05-11 HISTORY — DX: Gastro-esophageal reflux disease without esophagitis: K21.9

## 2013-05-11 LAB — CBC
HCT: 39.7 % (ref 36.0–46.0)
Hemoglobin: 13.3 g/dL (ref 12.0–15.0)
MCH: 29.6 pg (ref 26.0–34.0)
MCHC: 33.5 g/dL (ref 30.0–36.0)
MCV: 88.4 fL (ref 78.0–100.0)
Platelets: 272 10*3/uL (ref 150–400)
RBC: 4.49 MIL/uL (ref 3.87–5.11)
RDW: 13.1 % (ref 11.5–15.5)
WBC: 5.1 10*3/uL (ref 4.0–10.5)

## 2013-05-11 LAB — BASIC METABOLIC PANEL
BUN: 14 mg/dL (ref 6–23)
CO2: 29 mEq/L (ref 19–32)
Calcium: 9.7 mg/dL (ref 8.4–10.5)
Chloride: 101 mEq/L (ref 96–112)
Creatinine, Ser: 0.81 mg/dL (ref 0.50–1.10)
GFR calc Af Amer: 89 mL/min — ABNORMAL LOW (ref >90)
GFR calc non Af Amer: 77 mL/min — ABNORMAL LOW (ref >90)
Glucose, Bld: 84 mg/dL (ref 70–99)
Potassium: 4.8 mEq/L (ref 3.5–5.1)
Sodium: 138 mEq/L (ref 135–145)

## 2013-05-11 NOTE — Patient Instructions (Signed)
20 LEE-ANNE FLICKER  05/11/2013   Your procedure is scheduled on: 05/18/13  Report to [facility] at 05:15 AM.  Call this number if you have problems the morning of surgery [phone]   Remember: please complete fleets enema the night before surgery   Do not eat food or drink liquids After Midnight.     Take these medicines the morning of surgery with A SIP OF WATER: omeprazole, protonix, effexor   Do not wear jewelry, make-up or nail polish.  Do not wear lotions, powders, or perfumes. You may wear deodorant.  Do not shave 48 hours prior to surgery. Men may shave face and neck.  Do not bring valuables to the hospital.  Contacts, dentures or bridgework may not be worn into surgery.  Leave suitcase in the car. After surgery it may be brought to your room.  For patients admitted to the hospital, checkout time is 11:00 AM the day of discharge.  Birdie Sons, RN  pre op nurse call if needed [phone]    FAILURE TO FOLLOW THESE INSTRUCTIONS MAY RESULT IN CANCELLATION OF YOUR SURGERY   Patient Signature: ___________________________________________

## 2013-05-18 ENCOUNTER — Encounter (HOSPITAL_COMMUNITY)
Admission: RE | Disposition: A | Discharge status: Home / Self Care | Payer: Self-pay | Source: Ambulatory Visit | Attending: Surgery

## 2013-05-18 ENCOUNTER — Ambulatory Visit (HOSPITAL_COMMUNITY): Payer: 59

## 2013-05-18 ENCOUNTER — Ambulatory Visit (HOSPITAL_COMMUNITY): Payer: 59 | Admitting: Anesthesiology

## 2013-05-18 ENCOUNTER — Ambulatory Visit (HOSPITAL_COMMUNITY)
Admission: RE | Admit: 2013-05-18 | Discharge: 2013-05-19 | Disposition: A | Discharge status: Home / Self Care | Payer: 59 | Source: Ambulatory Visit | Attending: Surgery | Admitting: Surgery

## 2013-05-18 ENCOUNTER — Encounter (HOSPITAL_COMMUNITY): Payer: Self-pay | Admitting: *Deleted

## 2013-05-18 ENCOUNTER — Encounter (HOSPITAL_COMMUNITY): Payer: Self-pay | Admitting: Anesthesiology

## 2013-05-18 DIAGNOSIS — K811 Chronic cholecystitis: Secondary | ICD-10-CM | POA: Insufficient documentation

## 2013-05-18 DIAGNOSIS — I10 Essential (primary) hypertension: Secondary | ICD-10-CM | POA: Insufficient documentation

## 2013-05-18 DIAGNOSIS — Z9049 Acquired absence of other specified parts of digestive tract: Secondary | ICD-10-CM

## 2013-05-18 DIAGNOSIS — Z9884 Bariatric surgery status: Secondary | ICD-10-CM | POA: Insufficient documentation

## 2013-05-18 HISTORY — PX: CHOLECYSTECTOMY: SHX55

## 2013-05-18 LAB — CREATININE, SERUM
Creatinine, Ser: 0.77 mg/dL (ref 0.50–1.10)
GFR calc Af Amer: >90 mL/min (ref >90)
GFR calc non Af Amer: 89 mL/min — ABNORMAL LOW (ref >90)

## 2013-05-18 LAB — CBC
HCT: 32.5 % — ABNORMAL LOW (ref 36.0–46.0)
Hemoglobin: 10.8 g/dL — ABNORMAL LOW (ref 12.0–15.0)
MCH: 29.3 pg (ref 26.0–34.0)
MCHC: 33.2 g/dL (ref 30.0–36.0)
MCV: 88.3 fL (ref 78.0–100.0)
Platelets: 203 10*3/uL (ref 150–400)
RBC: 3.68 MIL/uL — ABNORMAL LOW (ref 3.87–5.11)
RDW: 13.2 % (ref 11.5–15.5)
WBC: 5.9 10*3/uL (ref 4.0–10.5)

## 2013-05-18 SURGERY — LAPAROSCOPIC CHOLECYSTECTOMY WITH INTRAOPERATIVE CHOLANGIOGRAM
Anesthesia: General | Site: Abdomen | Wound class: Clean Contaminated

## 2013-05-18 MED ORDER — KCL IN DEXTROSE-NACL 20-5-0.45 MEQ/L-%-% IV SOLN
INTRAVENOUS | Status: DC
  Administered 2013-05-18 – 2013-05-19 (×3): via INTRAVENOUS
  Filled 2013-05-18 (×3): qty 1000

## 2013-05-18 MED ORDER — NEOSTIGMINE METHYLSULFATE 1 MG/ML IJ SOLN
INTRAMUSCULAR | Status: DC | PRN
  Administered 2013-05-18: 3.5 mg via INTRAVENOUS

## 2013-05-18 MED ORDER — FENTANYL CITRATE 0.05 MG/ML IJ SOLN
INTRAMUSCULAR | Status: DC | PRN
  Administered 2013-05-18: 50 ug via INTRAVENOUS
  Administered 2013-05-18 (×4): 100 ug via INTRAVENOUS

## 2013-05-18 MED ORDER — OXYCODONE HCL 5 MG/5ML PO SOLN
5.0000 mg | Freq: Once | ORAL | Status: DC | PRN
  Filled 2013-05-18: qty 5

## 2013-05-18 MED ORDER — BUPIVACAINE-EPINEPHRINE 0.25% -1:200000 IJ SOLN
INTRAMUSCULAR | Status: AC
  Filled 2013-05-18: qty 1

## 2013-05-18 MED ORDER — SODIUM CHLORIDE 0.9 % IR SOLN
Status: DC | PRN
  Administered 2013-05-18: 1000 mL

## 2013-05-18 MED ORDER — PROPOFOL 10 MG/ML IV BOLUS
INTRAVENOUS | Status: DC | PRN
  Administered 2013-05-18: 150 mg via INTRAVENOUS

## 2013-05-18 MED ORDER — IOHEXOL 300 MG/ML  SOLN
INTRAMUSCULAR | Status: DC | PRN
  Administered 2013-05-18: 30 mL via INTRAVENOUS

## 2013-05-18 MED ORDER — HEPARIN SODIUM (PORCINE) 5000 UNIT/ML IJ SOLN
5000.0000 [IU] | Freq: Three times a day (TID) | INTRAMUSCULAR | Status: DC
  Administered 2013-05-18 – 2013-05-19 (×3): 5000 [IU] via SUBCUTANEOUS
  Filled 2013-05-18 (×6): qty 1

## 2013-05-18 MED ORDER — LIDOCAINE HCL (PF) 2 % IJ SOLN
INTRAMUSCULAR | Status: DC | PRN
  Administered 2013-05-18: 75 mg

## 2013-05-18 MED ORDER — MORPHINE SULFATE 2 MG/ML IJ SOLN
1.0000 mg | INTRAMUSCULAR | Status: DC | PRN
  Administered 2013-05-18 – 2013-05-19 (×5): 1 mg via INTRAVENOUS
  Filled 2013-05-18 (×5): qty 1

## 2013-05-18 MED ORDER — DIPHENHYDRAMINE HCL 50 MG/ML IJ SOLN
25.0000 mg | Freq: Once | INTRAMUSCULAR | Status: AC
  Administered 2013-05-18: 25 mg via INTRAVENOUS

## 2013-05-18 MED ORDER — ONDANSETRON HCL 4 MG/2ML IJ SOLN
INTRAMUSCULAR | Status: DC | PRN
  Administered 2013-05-18: 4 mg via INTRAMUSCULAR

## 2013-05-18 MED ORDER — OXYCODONE-ACETAMINOPHEN 5-325 MG PO TABS
1.0000 | ORAL_TABLET | ORAL | Status: DC | PRN
  Administered 2013-05-18 – 2013-05-19 (×3): 1 via ORAL
  Filled 2013-05-18 (×3): qty 1

## 2013-05-18 MED ORDER — HEPARIN SODIUM (PORCINE) 5000 UNIT/ML IJ SOLN
5000.0000 [IU] | Freq: Once | INTRAMUSCULAR | Status: AC
  Administered 2013-05-18: 5000 [IU] via SUBCUTANEOUS
  Filled 2013-05-18: qty 1

## 2013-05-18 MED ORDER — DEXAMETHASONE SODIUM PHOSPHATE 10 MG/ML IJ SOLN
INTRAMUSCULAR | Status: DC | PRN
  Administered 2013-05-18: 10 mg via INTRAVENOUS

## 2013-05-18 MED ORDER — CEFAZOLIN SODIUM-DEXTROSE 2-3 GM-% IV SOLR
INTRAVENOUS | Status: AC
  Filled 2013-05-18: qty 50

## 2013-05-18 MED ORDER — ROCURONIUM BROMIDE 100 MG/10ML IV SOLN
INTRAVENOUS | Status: DC | PRN
  Administered 2013-05-18: 20 mg via INTRAVENOUS
  Administered 2013-05-18: 40 mg via INTRAVENOUS
  Administered 2013-05-18: 10 mg via INTRAVENOUS

## 2013-05-18 MED ORDER — HYDROMORPHONE HCL PF 1 MG/ML IJ SOLN
0.2500 mg | INTRAMUSCULAR | Status: DC | PRN
  Administered 2013-05-18 (×2): 0.5 mg via INTRAVENOUS

## 2013-05-18 MED ORDER — BUPIVACAINE-EPINEPHRINE 0.25% -1:200000 IJ SOLN
INTRAMUSCULAR | Status: DC | PRN
  Administered 2013-05-18: 10 mL

## 2013-05-18 MED ORDER — MIDAZOLAM HCL 5 MG/5ML IJ SOLN
INTRAMUSCULAR | Status: DC | PRN
  Administered 2013-05-18: 2 mg via INTRAVENOUS

## 2013-05-18 MED ORDER — MEPERIDINE HCL 50 MG/ML IJ SOLN: 6.2500 mg | INTRAMUSCULAR | Status: DC | PRN

## 2013-05-18 MED ORDER — HYDROMORPHONE HCL PF 1 MG/ML IJ SOLN
INTRAMUSCULAR | Status: AC
  Filled 2013-05-18: qty 1

## 2013-05-18 MED ORDER — LACTATED RINGERS IV SOLN
INTRAVENOUS | Status: DC | PRN
  Administered 2013-05-18 (×2): via INTRAVENOUS

## 2013-05-18 MED ORDER — OXYCODONE HCL 5 MG PO TABS: 5.0000 mg | ORAL_TABLET | Freq: Once | ORAL | Status: DC | PRN

## 2013-05-18 MED ORDER — GLYCOPYRROLATE 0.2 MG/ML IJ SOLN
INTRAMUSCULAR | Status: DC | PRN
  Administered 2013-05-18: 0.4 mg via INTRAVENOUS

## 2013-05-18 MED ORDER — PROMETHAZINE HCL 25 MG/ML IJ SOLN: 6.2500 mg | INTRAMUSCULAR | Status: DC | PRN

## 2013-05-18 MED ORDER — LACTATED RINGERS IR SOLN
Status: DC | PRN
  Administered 2013-05-18: 1000 mL

## 2013-05-18 MED ORDER — LACTATED RINGERS IV SOLN: INTRAVENOUS | Status: DC

## 2013-05-18 MED ORDER — DEXTROSE 5 % IV SOLN
3.0000 g | INTRAVENOUS | Status: AC
  Administered 2013-05-18: 2 g via INTRAVENOUS
  Filled 2013-05-18: qty 3000

## 2013-05-18 SURGICAL SUPPLY — 37 items
APL SKNCLS STERI-STRIP NONHPOA (GAUZE/BANDAGES/DRESSINGS)
APPLIER CLIP ROT 10 11.4 M/L (STAPLE) ×2
APR CLP MED LRG 11.4X10 (STAPLE) ×1
BAG SPEC RTRVL LRG 6X4 10 (ENDOMECHANICALS) ×1
BENZOIN TINCTURE PRP APPL 2/3 (GAUZE/BANDAGES/DRESSINGS) IMPLANT
CABLE HIGH FREQUENCY MONO STRZ (ELECTRODE) ×1 IMPLANT
CANISTER SUCTION 2500CC (MISCELLANEOUS) ×2 IMPLANT
CATH REDDICK CHOLANGI 4FR 50CM (CATHETERS) ×2 IMPLANT
CLIP APPLIE ROT 10 11.4 M/L (STAPLE) ×1 IMPLANT
CLOTH BEACON ORANGE TIMEOUT ST (SAFETY) ×2 IMPLANT
COVER MAYO STAND STRL (DRAPES) ×2 IMPLANT
DECANTER SPIKE VIAL GLASS SM (MISCELLANEOUS) ×2 IMPLANT
DRAPE C-ARM 42X120 X-RAY (DRAPES) ×2 IMPLANT
DRAPE LAPAROSCOPIC ABDOMINAL (DRAPES) ×2 IMPLANT
ELECT REM PT RETURN 9FT ADLT (ELECTROSURGICAL) ×2
ELECTRODE REM PT RTRN 9FT ADLT (ELECTROSURGICAL) ×1 IMPLANT
GLOVE BIOGEL M 8.0 STRL (GLOVE) ×2 IMPLANT
GOWN STRL REIN XL XLG (GOWN DISPOSABLE) ×5 IMPLANT
HEMOSTAT SURGICEL 4X8 (HEMOSTASIS) IMPLANT
IV CATH 14GX2 1/4 (CATHETERS) ×2 IMPLANT
KIT BASIN OR (CUSTOM PROCEDURE TRAY) ×2 IMPLANT
NS IRRIG 1000ML POUR BTL (IV SOLUTION) ×2 IMPLANT
POUCH SPECIMEN RETRIEVAL 10MM (ENDOMECHANICALS) ×2 IMPLANT
SCISSORS LAP 5X35 DISP (ENDOMECHANICALS) ×2 IMPLANT
SET IRRIG TUBING LAPAROSCOPIC (IRRIGATION / IRRIGATOR) ×2 IMPLANT
SLEEVE XCEL OPT CAN 5 100 (ENDOMECHANICALS) ×4 IMPLANT
SOLUTION ANTI FOG 6CC (MISCELLANEOUS) ×2 IMPLANT
STRIP CLOSURE SKIN 1/2X4 (GAUZE/BANDAGES/DRESSINGS) IMPLANT
SUT VIC AB 4-0 SH 18 (SUTURE) ×2 IMPLANT
SYR 20CC LL (SYRINGE) ×2 IMPLANT
TOWEL OR 17X26 10 PK STRL BLUE (TOWEL DISPOSABLE) ×2 IMPLANT
TOWEL OR NON WOVEN STRL DISP B (DISPOSABLE) ×2 IMPLANT
TRAY LAP CHOLE (CUSTOM PROCEDURE TRAY) ×2 IMPLANT
TROCAR BLADELESS OPT 5 100 (ENDOMECHANICALS) ×2 IMPLANT
TROCAR XCEL BLUNT TIP 100MML (ENDOMECHANICALS) IMPLANT
TROCAR XCEL NON-BLD 11X100MML (ENDOMECHANICALS) ×2 IMPLANT
TUBING INSUFFLATION 10FT LAP (TUBING) ×2 IMPLANT

## 2013-05-18 NOTE — Transfer of Care (Signed)
Immediate Anesthesia Transfer of Care Note  Patient: Kari Gibbs  Procedure(s) Performed: Procedure(s): LAPAROSCOPIC CHOLECYSTECTOMY WITH INTRAOPERATIVE CHOLANGIOGRAM/laparoscopy/  (N/A)  Patient Location: PACU  Anesthesia Type:General  Level of Consciousness: awake, alert , oriented and patient cooperative  Airway & Oxygen Therapy: Patient Spontanous Breathing and Patient connected to face mask oxygen  Post-op Assessment: Report given to PACU RN, Post -op Vital signs reviewed and stable and Patient moving all extremities X 4  Post vital signs: Reviewed and stable  Complications: No apparent anesthesia complications

## 2013-05-18 NOTE — Op Note (Signed)
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 The abdomen was prepped with PCMX, draped, and a timeout performed. Access to the abdomen was achieved through the right upper quadrant with a 5 mm Optiview technique without difficulty. Following insufflation of the abdomen a second 5 mm was placed just below the umbilicus and a 30 scope and had excellent clarity was inserted and used for this procedure. Survey of the abdomen revealed no adhesions to the anterior abdominal wall. And a previous laparoscopy her Roux limb had been taken down from the intra-abdominal wall and these adhesions had not recurred and the Roux limb looked very good. There was an adhesive row of at the gastrojejunostomy likely where the staples were adherent to the liver but there was no evidence of obstruction at that location. The Roux limb was followed down to the jejunojejunostomy and both the BP limb was followed back to its exit site at the ligament of Treitz and the common channel was identified and followed all the way to the terminal ileum. There was no evidence of an internal hernia and again no adhesions were found.  The gallbladder was quite large and floppy and you could take the fundus and tilted up over the liver resembling a phrygian cap. The gallbladder was lifted and the infundibulum was stuck to the liver and it was stuck to itself and there was a very prominent common bile duct seen. The anatomy was carefully dissected and delineated with a critical view to or reveal a slightly dilated cystic duct, height separate single cystic artery which bifurcated supply both the size of the gallbladder and the junction with the common bile duct.  These structures were clipped including 2 clips on the artery and a clip upon the cystic duct nearest junction with the gallbladder. An incision was made and this slight amount of mucus and was delivered but no obvious stones. A dynamic cholangiogram was performed which showed a dilated common bile duct with free flow into the duodenum and intrahepatic filling. No obvious filling defects were seen. The cystic duct was then triple clipped and divided and the cystic artery were was further clipped and divided and then the gallbladder was removed with the hook electrocautery from the gallbladder bed without entering it. He was placed in a bag and brought out through the 11/12 mm port in the upper midline.the gallbladder bed was inspected and no bleeding or bile leaks were seen. Liver was inspected and again no external nodular nodules or irregularities were noted. I initially attempted to take down some adhesions between the staple line of liver felt that this was in a create more problems in sol.  We irrigated and then infiltrated all port sites with Marcaine. Wounds were closed with Vicryl and Dermabond. Patient tolerated procedure well was taken recovery room in satisfactory condition.  Matt B. Daphine Deutscher, MD, [facility], [place] [phone]

## 2013-05-18 NOTE — Anesthesia Preprocedure Evaluation (Addendum)
Anesthesia Evaluation  Patient identified by MRN, date of birth, ID band Patient awake    Reviewed: Allergy & Precautions, H&P , NPO status , Patient's Chart, lab work & pertinent test results  Airway Mallampati: II TM Distance: >3 FB Neck ROM: Full    Dental  (+) Chipped, Teeth Intact and Dental Advisory Given,    Pulmonary pneumonia -,  breath sounds clear to auscultation  Pulmonary exam normal       Cardiovascular hypertension, Pt. on medications + Valvular Problems/Murmurs MR Rhythm:Regular Rate:Normal  Echo 01/2010 - Left ventricle: The cavity size was normal. Wall thickness was normal. Systolic function was low normal. The estimated ejection fraction was in the range of 50% to 55%. Wall motion was normal; there were no regional wall motion abnormalities. Features are consistent with a pseudonormal left ventricular filling pattern, with concomitant abnormal relaxation and increased filling  pressure (grade 2 diastolic dysfunction).  - Aortic valve: There was no stenosis.  - Mitral valve: Probably mild eccentric regurgitation.  - Right ventricle: The cavity size was normal. Systolic function was  normal.  - Pulmonary arteries: PA peak pressure: 24mm Hg (S).  - Inferior vena cava: The vessel was normal in size; the    respirophasic diameter changes were in the normal range (= 50%);   findings are consistent with normal central venous pressure.   Impressions:  - Normal LV size with low normal systolic function, estimated EF 50-55%. No regional wall motion abnormalities. Moderate diastolic  dysfunction. Probably mild eccentric mitral regurgitation. Normal PA pressure. Normal RV size and systolic function.    Neuro/Psych  Headaches, PSYCHIATRIC DISORDERS Anxiety Depression    GI/Hepatic Neg liver ROS, hiatal hernia, GERD-  Medicated,Prior gastric bypass   Endo/Other  negative endocrine ROS  Renal/GU negative Renal ROS       Musculoskeletal negative musculoskeletal ROS (+)   Abdominal   Peds  Hematology negative hematology ROS (+)   Anesthesia Other Findings   Reproductive/Obstetrics negative OB ROS                         Anesthesia Physical  Anesthesia Plan  ASA: II  Anesthesia Plan: General   Post-op Pain Management:    Induction: Intravenous  Airway Management Planned: Oral ETT  Additional Equipment:   Intra-op Plan:   Post-operative Plan: Extubation in OR  Informed Consent: I have reviewed the patients History and Physical, chart, labs and discussed the procedure including the risks, benefits and alternatives for the proposed anesthesia with the patient or authorized representative who has indicated his/her understanding and acceptance.   Dental advisory given  Plan Discussed with: CRNA  Anesthesia Plan Comments: (No gastric tube placement unless directed by surgeon.)       Anesthesia Quick Evaluation

## 2013-05-18 NOTE — H&P (Signed)
Chief Complaint:  Postprandial epigastric pain chronic and recurrent  History of Present Illness:  Kari Gibbs is an 62 y.o. female who underwent a Lap Roux en Y gastric bypass in April 2011 for morbid obesity.  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  Her postop course was marked by the late onset of C difficile colitis.  Unfortunately despite her recent treatment date (2011), these records are not readily available in EPIC.  She has lost weight and is now rather lean.  Along the way she has continued to have waxing and waning abdominal pain and cramps.  She has become dependent on Percocet in dealing with with pain.  She has undergone a laparoscopic evaluation and enterolysis for adhesions to her roux limb.  No internal hernia was noted.  She was taken back for laparoscopic resection of the "candy cane" portion of her gastrojejunostomy and this provided  temporary improvement.  She has undergone endoscopies that have not shown ulcer disease.    In 2013 she had a CT scan that showed possible sludge in her gallbaldder.  A HIDA this summer showed a 50% ejection fraction but pain and cramping accompanied the fatty meal.  We have discussed her continued pain and felt that we should proceed with laparoscopy and probable cholecystectomy because of her persistent pain.    Past Medical History  Diagnosis Date  . Fatigue   . Nausea & vomiting   . IBS (irritable bowel syndrome)   . Abdominal pain   . Hypertension   . Depression     MOSTLY IN THE FALL OF THE YEAR  . Pneumonia     WHEN PT IN 6 TH GRADE  . Headache(784.0)     HX OF MIGRAINES  . Arthritis     OA LOWER SPINE  . SBO (small bowel obstruction) 2013  . Hiatal hernia 09/2009  . 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 Gastritis     Distal gastritis  . Anxiety   . GERD (gastroesophageal reflux disease)     "told  that she does"  . History of kidney stones     Past Surgical History  Procedure Laterality Date  . Gastric bypass  11/13/2009  . Exploratory laparotomy  2012  . Tmj- left--about 10 yrs ago--left side--pt now has some pain left jaw Left     still has pain, scar tissue has been removed  . Laparoscopy  03/04/2012    Procedure: LAPAROSCOPY DIAGNOSTIC;  Surgeon: Valarie Merino, MD;  Location: [facility];  Service: General;  Laterality: N/A;  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  . Hernia repair  2011    hiatal  . Abdominal hysterectomy  1988    partial    Current Facility-Administered Medications  Medication Dose Route Frequency Provider Last Rate Last Dose  . ceFAZolin (ANCEF) 3 g in dextrose 5 % 50 mL IVPB  3 g Intravenous On Call to OR Valarie Merino, MD       Iohexol; Ace inhibitors; and Losartan potassium History reviewed. No pertinent family history. Social History:   reports that she has never smoked. She has never used smokeless tobacco. She reports that she does not drink alcohol or use illicit drugs.   REVIEW OF SYSTEMS - PERTINENT POSITIVES ONLY: Post prandial epigastric pain as detailed above.   Physical Exam:   Blood pressure 121/60, pulse  66, temperature 96.5 F (35.8 C), temperature source Oral, resp. rate 18, SpO2 98.00%. There is no weight on file to calculate BMI.  Gen:  WDthin WF NAD  Neurological: Alert and oriented to person, place, and time. Motor and sensory function is grossly intact  Head: Normocephalic and atraumatic.  Eyes: Conjunctivae are normal. Pupils are equal, round, and reactive to light. No scleral icterus.  Neck: Normal range of motion. Neck supple. No tracheal deviation or thyromegaly present.  Cardiovascular:  SR without murmurs or gallops.  No carotid bruits Respiratory: Effort normal.  No respiratory distress. No chest wall tenderness. Breath sounds normal.  No wheezes, rales or rhonchi.  Abdomen:  Flat and without masses or tenderness to  palpation GU: Musculoskeletal: Normal range of motion. Extremities are nontender. No cyanosis, edema or clubbing noted Lymphadenopathy: No cervical, preauricular, postauricular or axillary adenopathy is present Skin: Skin is warm and dry. No rash noted. No diaphoresis. No erythema. No pallor. Pscyh: Normal mood and affect. Behavior is normal. Judgment and thought content normal.   LABORATORY RESULTS: No results found for this or any previous visit (from the past 48 hour(s)).  RADIOLOGY RESULTS: No results found.  Problem List: Patient Active Problem List   Diagnosis Date Noted  . 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 Abdominal pain 01/15/2012  . CLOSTRIDIUM DIFFICILE COLITIS 05/01/2010  . DEPRESSION 05/01/2010  . HYPERTENSION 05/01/2010  . GERD 05/01/2010  . OSTEOARTHRITIS 05/01/2010  . 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 ALLERGIC RHINITIS 06/26/2007    Assessment & Plan: Persistent postprandial pain after roux Y gastric bypass.  Etiology is uncertain but studies suggest gallbladder pathology and will likely remove the gallbladder as a source of this pain.   Plan: Laparoscopy and probable cholecystectomy    Matt B. Daphine Deutscher, MD, [facility] [phone] beeper [phone]  05/18/2013 6:24 AM

## 2013-05-18 NOTE — Anesthesia Postprocedure Evaluation (Signed)
Anesthesia Post Note  Patient: Kari Gibbs  Procedure(s) Performed: Procedure(s) (LRB): LAPAROSCOPIC CHOLECYSTECTOMY WITH INTRAOPERATIVE CHOLANGIOGRAM/laparoscopy/  (N/A)  Anesthesia type: General  Patient location: PACU  Post pain: Pain level controlled  Post assessment: Post-op Vital signs reviewed  Last Vitals: BP 108/49  Pulse 80  Temp(Src) 37 C (Tympanic)  Resp 16  Ht 5\' 6"  (1.676 m)  Wt 132 lb (59.875 kg)  BMI 21.32 kg/m2  SpO2 100%  Post vital signs: Reviewed  Level of consciousness: sedated  Complications: No apparent anesthesia complications

## 2013-05-18 NOTE — Interval H&P Note (Signed)
History and Physical Interval Note:  05/18/2013 7:19 AM  Kari Gibbs  has presented today for surgery, with the diagnosis of ABDOMINAL PAIN p/u rnygb   The various methods of treatment have been discussed with the patient and family. After consideration of risks, benefits and other options for treatment, the patient has consented to  Procedure(s): LAPAROSCOPIC CHOLECYSTECTOMY WITH INTRAOPERATIVE CHOLANGIOGRAM/laparoscopy/ possible endoscopy  (N/A) as a surgical intervention .  The patient's history has been reviewed, patient examined, no change in status, stable for surgery.  I have reviewed the patient's chart and labs.  Questions were answered to the patient's satisfaction.     Jermey Closs B

## 2013-05-19 ENCOUNTER — Encounter (HOSPITAL_COMMUNITY): Payer: Self-pay | Admitting: Surgery

## 2013-05-19 LAB — COMPREHENSIVE METABOLIC PANEL
ALT: 25 U/L (ref 0–35)
AST: 33 U/L (ref 0–37)
Albumin: 3 g/dL — ABNORMAL LOW (ref 3.5–5.2)
Alkaline Phosphatase: 61 U/L (ref 39–117)
BUN: 8 mg/dL (ref 6–23)
CO2: 27 mEq/L (ref 19–32)
Calcium: 8.7 mg/dL (ref 8.4–10.5)
Chloride: 105 mEq/L (ref 96–112)
Creatinine, Ser: 0.72 mg/dL (ref 0.50–1.10)
GFR calc Af Amer: >90 mL/min (ref >90)
GFR calc non Af Amer: >90 mL/min (ref >90)
Glucose, Bld: 141 mg/dL — ABNORMAL HIGH (ref 70–99)
Potassium: 3.7 mEq/L (ref 3.5–5.1)
Sodium: 140 mEq/L (ref 135–145)
Total Bilirubin: 0.4 mg/dL (ref 0.3–1.2)
Total Protein: 5.4 g/dL — ABNORMAL LOW (ref 6.0–8.3)

## 2013-05-19 LAB — CBC
HCT: 31 % — ABNORMAL LOW (ref 36.0–46.0)
Hemoglobin: 10.3 g/dL — ABNORMAL LOW (ref 12.0–15.0)
MCH: 29.6 pg (ref 26.0–34.0)
MCHC: 33.2 g/dL (ref 30.0–36.0)
MCV: 89.1 fL (ref 78.0–100.0)
Platelets: 206 10*3/uL (ref 150–400)
RBC: 3.48 MIL/uL — ABNORMAL LOW (ref 3.87–5.11)
RDW: 13.3 % (ref 11.5–15.5)
WBC: 6.5 10*3/uL (ref 4.0–10.5)

## 2013-05-19 MED ORDER — OXYCODONE-ACETAMINOPHEN 5-325 MG PO TABS: 1.0000 | ORAL_TABLET | ORAL | Status: DC | PRN

## 2013-05-19 NOTE — Discharge Summary (Signed)
Physician Discharge Summary  Patient ID: Kari Gibbs MRN: [id number] DOB/AGE: November 21, 1950 62 y.o.  Admit date: 05/18/2013 Discharge date: 05/19/2013  Admission Diagnoses:  Post lap gastric bypass abdominal pain and gallbladder sludge  Discharge Diagnoses:  same  Active Problems:   S/P laparoscopic cholecystectomy Oct 2014   Surgery:  Laparoscopic cholecystectomy with IOC   Discharged Condition: improved  Hospital Course:   Had surgery and kept for observation.  Able to take liquids without pain.    Consults: none  Significant Diagnostic Studies: IOC     Discharge Exam: Blood pressure 122/56, pulse 66, temperature 98.3 F (36.8 C), temperature source Oral, resp. rate 18, height 5\' 6"  (1.676 m), weight 132 lb (59.875 kg), SpO2 100.00%. Incisions are covered with Dermabond  Disposition: 01-Home or Self Care  Discharge Orders   Future Orders Complete By Expires   Diet Carb Modified  As directed    Increase activity slowly  As directed    No wound care  As directed        Medication List         ALIGN 4 MG Caps  Take 1 capsule by mouth at bedtime. Per Pt.     omeprazole 20 MG capsule  Commonly known as:  PRILOSEC  Take 20 mg by mouth 2 (two) times daily.     OVER THE COUNTER MEDICATION  1 capsule. Wellesse calcium 1000, vitamin d, magnessium     OVER THE COUNTER MEDICATION  Take 1 capsule by mouth daily. wellesse multivitamin     oxyCODONE-acetaminophen 5-325 MG per tablet  Commonly known as:  ROXICET  Take 1 tablet by mouth every 4 (four) hours as needed.     pantoprazole 40 MG tablet  Commonly known as:  PROTONIX  Take 1 tablet (40 mg total) by mouth daily.     sucralfate 1 GM/10ML suspension  Commonly known as:  CARAFATE  Take 10 mLs (1 g total) by mouth 4 (four) times daily.     UNABLE TO FIND  Take 1 capsule by mouth daily as needed. Med Name: [address] [address] Pepogest (Peppermint oil)     valsartan 40 MG tablet  Commonly known as:  DIOVAN   Take 40 mg by mouth every morning.     venlafaxine XR 75 MG 24 hr capsule  Commonly known as:  EFFEXOR-XR  Take 225 mg by mouth every morning.     Vitamin B-12 500 MCG Subl  Place 500 mcg under the tongue daily.     Vitamin D (Ergocalciferol) 50000 UNITS Caps capsule  Commonly known as:  DRISDOL  Take 50,000 Units by mouth every Thursday.     WELLESSE VITAMIN D3 1000 UNIT/10ML Liqd  Generic drug:  Cholecalciferol  Take 15-30 mLs by mouth 2 (two) times daily. 2 tablespoon in the morning and 1 in the afternoon           Follow-up Information   Follow up with Valarie Merino, MD.   Specialty:  General Surgery   Contact information:   [address] [place] [address] [phone]       Signed: Valarie Merino 05/19/2013, 11:52 AM

## 2013-05-24 ENCOUNTER — Encounter (INDEPENDENT_AMBULATORY_CARE_PROVIDER_SITE_OTHER): Payer: Self-pay | Admitting: *Deleted

## 2013-05-24 ENCOUNTER — Telehealth (INDEPENDENT_AMBULATORY_CARE_PROVIDER_SITE_OTHER): Payer: Self-pay | Admitting: *Deleted

## 2013-05-24 NOTE — Telephone Encounter (Signed)
Patient called to ask about continued pain.  After speaking with patient for some time it was determined this is just normal post surgical pain.  Explained to patient that we could get her a refill of her pain medication but it would be per protocol Norco 5/325mg  Take 1 tablet every 4-6 hours as needed for pain # 30 no refills.  Patient states she cannot take Ibuprofen due to being a gastric bypass patient.  Explained that I would send Dr. Daphine Deutscher a message to ask if there is anything other than the Norco she can use.  Patient also asked about going back to work.  Explained that she can return to work light duty on 06/01/13 which is 2 weeks PO then would be released full duty following her PO appt on 06/23/13 if there are no complications.  Explained that once I know Dr. Ermalene Searing recommendations then I will let her know.  Patient states understanding and agreeable at this time.

## 2013-05-27 ENCOUNTER — Telehealth (INDEPENDENT_AMBULATORY_CARE_PROVIDER_SITE_OTHER): Payer: Self-pay

## 2013-05-27 NOTE — Telephone Encounter (Signed)
Pt calling stating she has been having discomfort 3" above and to left of umbilical incision. It is a throbbing that is continuous. No redness,swelling,fever. Incision healing well per pt. No vomiting. Voiding well without complaint. Last BM yesterday was normal. Moving does not improve or increase pain. No SOB. Pt states she is still using he pain med for this discomfort. Pt wants to know if there is any recommendations for improving her discomfort. Pt states she has enough pain med to last until Monday. Pt advised I will send this to Dr Daphine Deutscher and his assistant to review and call pt back. Pt can be reached at [phone].

## 2013-05-28 ENCOUNTER — Other Ambulatory Visit (INDEPENDENT_AMBULATORY_CARE_PROVIDER_SITE_OTHER): Payer: Self-pay | Admitting: Surgery

## 2013-05-28 DIAGNOSIS — R1012 Left upper quadrant pain: Secondary | ICD-10-CM

## 2013-05-28 MED ORDER — OXYCODONE-ACETAMINOPHEN 5-325 MG PO TABS: 1.0000 | ORAL_TABLET | ORAL | Status: DC | PRN

## 2013-05-28 NOTE — Telephone Encounter (Signed)
Patient called back again asking if Dr. Daphine Deutscher had made any suggestions on her call yesterday.  Patient also wants to add that she isn't taking the Norco since it doesn't help what so ever and she is unable to sleep at night.  Explained that I will send a message on to Dr. Daphine Deutscher and Meagen to see what suggestions they would have.

## 2013-05-28 NOTE — Telephone Encounter (Signed)
LMOM letting pt know that her Rx is waiting at the front desk for her to pick up.

## 2013-05-28 NOTE — Telephone Encounter (Signed)
Needed Percocet for post op pain-or pain that has not resolved.

## 2013-05-28 NOTE — Telephone Encounter (Signed)
Patient needing to pick up pain med before 5 p,  if DR. Daphine Deutscher approves please  advise

## 2013-05-31 ENCOUNTER — Encounter (INDEPENDENT_AMBULATORY_CARE_PROVIDER_SITE_OTHER): Payer: Self-pay | Admitting: Surgery

## 2013-05-31 ENCOUNTER — Ambulatory Visit (INDEPENDENT_AMBULATORY_CARE_PROVIDER_SITE_OTHER): Payer: 59 | Admitting: Surgery

## 2013-05-31 VITALS — BP 140/86 | HR 84 | Temp 98.4°F | Resp 15 | Ht 66.0 in | Wt 131.0 lb

## 2013-05-31 DIAGNOSIS — Z9049 Acquired absence of other specified parts of digestive tract: Secondary | ICD-10-CM

## 2013-05-31 DIAGNOSIS — Z9889 Other specified postprocedural states: Secondary | ICD-10-CM

## 2013-05-31 NOTE — Progress Notes (Signed)
[facility]  Ovidio Kin, MD,  FACS [address]  [address] [place], [place]    [address] Phone:  [phone] FAX:  [phone]   Re:   Kari Gibbs DOB:   1951-03-04 MRN:   [id number]  Urgent Office  ASSESSMENT AND PLAN: 1.  LUQ pain - Etiology unclear  She thinks that this is new since surgery.  She has had a litany of abdominal problems since her weight loss surgery.  There is nothing on clinical exam.  For now, I think giving her time is the best option.  I wrote her our of work until 06/24/2013.  She sees Dr. Daphine Deutscher the day before.  2.  Lap chole - 05/18/2013 - Sheron Nightingale  To see Daphine Deutscher post op f/u - 06/23/2013  3.  RYGB - 11/14/2009 - M. Martin      HISTORY OF PRESENT ILLNESS: Chief Complaint  Patient presents with  . Follow-up    LUQ pain    Kari Gibbs is a 62 y.o. (DOB: 1951/02/18)  white  female who is a patient of MCNEILL,WENDY, MD and comes to the Urgent Office for LUQ pain.   She describes it as burning and constant.  It is not related to eating.  She says that it has been there since surgery.  She has no nausea or vomiting.  She stays constipated and her bowels are about the same.  She has had no fever.  She had the cholecystectomy for persistent epigastric pain related to eating.  Though the current pain is close to the other pain, she thinks this is different.  So one pain is better and now she has a new pain.  She has had a series of abdominal problems since her RYGB - that she went over with me.  I did an upper endo on her 11/06/2010 which was negative.   Past Medical History  Diagnosis Date  . Fatigue   . Nausea & vomiting   . IBS (irritable bowel syndrome)   . Abdominal pain   . Hypertension   . Depression     MOSTLY IN THE FALL OF THE YEAR  . Pneumonia     WHEN PT IN 6 TH GRADE  . Headache(784.0)     HX OF MIGRAINES  . Arthritis     OA LOWER SPINE  . SBO (small bowel obstruction) 2013  . Hiatal hernia 09/2009   . 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 Gastritis     Distal gastritis  . Anxiety   . GERD (gastroesophageal reflux disease)     "told that she does"  . 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  PHYSICAL EXAM: BP 140/86  Pulse 84  Temp(Src) 98.4 F (36.9 C) (Temporal)  Resp 15  Ht 5\' 6"  (1.676 m)  Wt 131 lb (59.421 kg)  BMI 21.15 kg/m2  General: Thin WF who is alert and generally healthy appearing.  She is holding her left side. HEENT: Normal. Pupils equal.  Lungs: Clear to auscultation and symmetric breath sounds. Heart:  RRR. No murmur or rub. Abdomen: Soft. No mass. No tenderness. No hernia. Normal bowel sounds.  Incisions look good.  No peritoneal signs.  She points to the left subcostal area as the pain, but there is no physical finding. Extremities:  Good strength and ROM  in upper and  lower extremities. Neurologic:  Grossly intact to motor and sensory function.  DATA REVIEWED: Epic notes   Ovidio Kin, MD,  [facility] [place] [facility], [place] [address]  [address]   [place], [place]    [address] Phone:  [phone] FAX:  [phone]

## 2013-06-08 ENCOUNTER — Telehealth (INDEPENDENT_AMBULATORY_CARE_PROVIDER_SITE_OTHER): Payer: Self-pay

## 2013-06-08 NOTE — Telephone Encounter (Signed)
Pt called requesting pain medicine. She is still having pain. States Dr Ezzard Standing last saw her. Dr Daphine Deutscher is not available until Friday. Advised pt we will ask Dr Ezzard Standing tomorrow if it is okay to refill norco since he is aware of her case.

## 2013-06-09 ENCOUNTER — Other Ambulatory Visit (INDEPENDENT_AMBULATORY_CARE_PROVIDER_SITE_OTHER): Payer: Self-pay

## 2013-06-09 ENCOUNTER — Telehealth (INDEPENDENT_AMBULATORY_CARE_PROVIDER_SITE_OTHER): Payer: Self-pay

## 2013-06-09 DIAGNOSIS — R109 Unspecified abdominal pain: Secondary | ICD-10-CM

## 2013-06-09 MED ORDER — HYDROCODONE-ACETAMINOPHEN 5-325 MG PO TABS: 1.0000 | ORAL_TABLET | Freq: Four times a day (QID) | ORAL | Status: DC | PRN

## 2013-06-09 NOTE — Telephone Encounter (Signed)
Patient aware of Rx vicodin 5/325mg  at front desk for P/u

## 2013-06-11 ENCOUNTER — Telehealth (INDEPENDENT_AMBULATORY_CARE_PROVIDER_SITE_OTHER): Payer: Self-pay

## 2013-06-11 NOTE — Telephone Encounter (Signed)
Pt called in 3.5 weeks s/p cholecystectomy with post op pain. She was given another refill of norco on 10/29 by Dr Ezzard Standing. She is now having to take 2 tablets at a time and she is still experiencing pain. She is concerned that pain medicine is not relieving her pain and wants to know what Dr Daphine Deutscher recommends for her so she can get some relief. Advised I will send a message to Dr Daphine Deutscher and Baron Hamper and she will get a call back.

## 2013-06-14 NOTE — Telephone Encounter (Signed)
Pt called back.  Pt upset that she is still having pain.  Let pt know that I spoke with Dr. Daphine Deutscher on Friday, and his reply was "I have no idea what to do. All of her scans are coming back fine."  Pt asked if she could see Dr. Ezzard Standing this week since Dr. Daphine Deutscher is out.  I let her know that both doctor's are out of the office this entire week, but Daphine Deutscher will be back on Friday.  I advised that the only thing I could do was give her an appt with Dr. Biagio Quint in today's urgent office.  Pt states "all he is going to do is look in my chart and see normal scans and not do anything"  I let her know that if she see any doctor, the first thing they are going to do is look at her recent scans and tests.  I let the pt know that I can speak with another doctor and see if they would be willing to prescribe a stronger medication to which she stated "probably not since I just got some pain medication last week that's not working".  Pt became agitated when I said "Ma'am, I'm running out of options here.  I have suggested all that I can."  Pt said "thank you" and hung up.

## 2013-06-15 ENCOUNTER — Telehealth (INDEPENDENT_AMBULATORY_CARE_PROVIDER_SITE_OTHER): Payer: Self-pay

## 2013-06-15 NOTE — Telephone Encounter (Signed)
Patient is asking to see Dr. Daphine Deutscher or Dr. Ezzard Standing due to her  "severe  left lower abd." Pain Rates 7 advised Dr. Daphine Deutscher nor Dr. Ezzard Standing is in the office . Offered urg appt . Patient ask if this would be post op appt., I said no this would be a urgent appt due to her" severe abdomen pain". Or she could go to the ER. Patient states she would call back if she decided to be seen.

## 2013-06-17 ENCOUNTER — Other Ambulatory Visit: Payer: Self-pay

## 2013-06-18 ENCOUNTER — Other Ambulatory Visit (INDEPENDENT_AMBULATORY_CARE_PROVIDER_SITE_OTHER): Payer: Self-pay | Admitting: Surgery

## 2013-06-18 ENCOUNTER — Telehealth (INDEPENDENT_AMBULATORY_CARE_PROVIDER_SITE_OTHER): Payer: Self-pay

## 2013-06-18 DIAGNOSIS — R1012 Left upper quadrant pain: Secondary | ICD-10-CM

## 2013-06-18 MED ORDER — OXYCODONE-ACETAMINOPHEN 5-325 MG PO TABS: 1.0000 | ORAL_TABLET | ORAL | Status: DC | PRN

## 2013-06-18 NOTE — Telephone Encounter (Signed)
LMOM letting pt know that dr. Daphine Deutscher wrote an Rx for Roxicet 5-325 and that she can pick it up from our front desk at her convenience.

## 2013-06-23 ENCOUNTER — Ambulatory Visit (INDEPENDENT_AMBULATORY_CARE_PROVIDER_SITE_OTHER): Payer: 59 | Admitting: Surgery

## 2013-06-23 ENCOUNTER — Encounter (INDEPENDENT_AMBULATORY_CARE_PROVIDER_SITE_OTHER): Payer: Self-pay

## 2013-06-23 ENCOUNTER — Telehealth (INDEPENDENT_AMBULATORY_CARE_PROVIDER_SITE_OTHER): Payer: Self-pay

## 2013-06-23 ENCOUNTER — Encounter (INDEPENDENT_AMBULATORY_CARE_PROVIDER_SITE_OTHER): Payer: Self-pay | Admitting: Surgery

## 2013-06-23 VITALS — BP 118/80 | HR 88 | Temp 97.2°F | Resp 14 | Ht 66.0 in | Wt 131.0 lb

## 2013-06-23 DIAGNOSIS — Z9889 Other specified postprocedural states: Secondary | ICD-10-CM

## 2013-06-23 DIAGNOSIS — Z9049 Acquired absence of other specified parts of digestive tract: Secondary | ICD-10-CM

## 2013-06-23 NOTE — Progress Notes (Signed)
Kari Gibbs 62 y.o.  Body mass index is 21.15 kg/(m^2).  Patient Active Problem List   Diagnosis Date Noted  . S/P laparoscopic cholecystectomy Oct 2014 05/18/2013  . 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 Abdominal pain 01/15/2012  . CLOSTRIDIUM DIFFICILE COLITIS 05/01/2010  . DEPRESSION 05/01/2010  . HYPERTENSION 05/01/2010  . GERD 05/01/2010  . OSTEOARTHRITIS 05/01/2010  . 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 ALLERGIC RHINITIS 06/26/2007    Allergies  Allergen Reactions  . Iohexol      Code: HIVES, Desc: Pt has had a prior IVP dye reaction,(no scans in health system).Symptoms were hives,and airway obstruction!, Onset Date: 16109604   . Ace Inhibitors     REACTION: cyclic cough  . Losartan Potassium Other (See Comments)    Raises blood pressure; fatigue    Past Surgical History  Procedure Laterality Date  . Gastric bypass  11/13/2009  . Exploratory laparotomy  2012  . Tmj- left--about 10 yrs ago--left side--pt now has some pain left jaw Left     still has pain, scar tissue has been removed  . Laparoscopy  03/04/2012    Procedure: LAPAROSCOPY DIAGNOSTIC;  Surgeon: Valarie Merino, MD;  Location: [facility];  Service: General;  Laterality: N/A;  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  . Hernia repair  2011    hiatal  . Abdominal hysterectomy  1988    partial  . Cholecystectomy N/A 05/18/2013    Procedure: LAPAROSCOPIC CHOLECYSTECTOMY WITH INTRAOPERATIVE CHOLANGIOGRAM/laparoscopy/ ;  Surgeon: Valarie Merino, MD;  Location: [facility];  Service: General;  Laterality: N/A;   MCNEILL,WENDY, MD No diagnosis found.  The midepigastric pain that she was having is gone. However she is having left upper quadrant pain that is over her splenic flexure and radiates around into her back. This is not accompanied by shoulder pain. This pain hurts worse when she lies on that side. Sometimes he will make it better.  She said she is due for a colonoscopy  by Dr. Bosie Clos. I think as a first step we should get a colonoscopy to make sure there is nothing there that needs to be treated. It that's okay then we'll need to look at her kidney and spleen and would probably get a CT scan. 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 We will contact Dr. Marge Duncans office and try to expedite setting up a colonoscopy. In the meantime she is not capable of returning back to work so we will keep her up for another 2 weeks. I'll see her back after her colonoscopy by Dr. Georgiann Mohs B. Daphine Deutscher, MD, [facility] [phone] beeper [phone]  06/23/2013 2:06 PM

## 2013-06-23 NOTE — Telephone Encounter (Signed)
While pt in the office, Dr. Daphine Deutscher wrote Rx for Percocet 5-325 #30 prn for pain.  Pt was also given an appointment with Dr. Marge Duncans office for colonoscopy consult on Dec 1 @1pm .

## 2013-07-01 ENCOUNTER — Other Ambulatory Visit (INDEPENDENT_AMBULATORY_CARE_PROVIDER_SITE_OTHER): Payer: Self-pay | Admitting: Surgery

## 2013-07-01 ENCOUNTER — Telehealth (INDEPENDENT_AMBULATORY_CARE_PROVIDER_SITE_OTHER): Payer: Self-pay

## 2013-07-01 DIAGNOSIS — R1012 Left upper quadrant pain: Secondary | ICD-10-CM

## 2013-07-01 MED ORDER — OXYCODONE-ACETAMINOPHEN 5-325 MG PO TABS: 1.0000 | ORAL_TABLET | ORAL | Status: DC | PRN

## 2013-07-01 NOTE — Telephone Encounter (Signed)
Pt referred to Dr. Bosie Clos by Dr. Daphine Deutscher.  She is scheduled to see him 07/12/13.  She will be leaving town this weekend for seven days to take care of her mother, and is requesting a refill of her Oxycodone to hold her over until she sees Dr. Bosie Clos.  Pt was told message would be relayed to Dr. Daphine Deutscher.  Please advise.

## 2013-07-01 NOTE — Telephone Encounter (Signed)
Patient requested Percocet refill.  She is currently under workup for residual left upper quadrant abdominal pain and is to have colonoscopy.

## 2013-07-12 ENCOUNTER — Telehealth (INDEPENDENT_AMBULATORY_CARE_PROVIDER_SITE_OTHER): Payer: Self-pay

## 2013-07-12 ENCOUNTER — Other Ambulatory Visit: Payer: Self-pay | Admitting: Gastroenterology

## 2013-07-12 DIAGNOSIS — R1012 Left upper quadrant pain: Secondary | ICD-10-CM

## 2013-07-12 NOTE — Telephone Encounter (Signed)
Melissa at Dr Marge Duncans office called stating pt was seen today and will be having upcoming endoscopy. The pt has asked for an extension of her [employer] disability to cover her until after her Endoscopy. Dr Bosie Clos is not willing to to this and has advised pt she will need to ask Dr Daphine Deutscher to extend this. I advised Melissa that I will send this request to Dr Daphine Deutscher for review. Pt can be reached at her home #.

## 2013-07-13 ENCOUNTER — Telehealth (INDEPENDENT_AMBULATORY_CARE_PROVIDER_SITE_OTHER): Payer: Self-pay | Admitting: *Deleted

## 2013-07-13 ENCOUNTER — Ambulatory Visit
Admission: RE | Admit: 2013-07-13 | Discharge: 2013-07-13 | Disposition: A | Discharge status: Home / Self Care | Payer: 59 | Source: Ambulatory Visit | Attending: Gastroenterology | Admitting: Gastroenterology

## 2013-07-13 DIAGNOSIS — R1012 Left upper quadrant pain: Secondary | ICD-10-CM

## 2013-07-13 NOTE — Telephone Encounter (Signed)
Patient called to ask about a refill of her Percocet.  Patient states she has been trying to stretch them as far as she can but is going to run out either tomorrow or Thursday.  Patient also asking about extension of disability paperwork. I spoke with Dr. Daphine Deutscher who will review need for refill and will sign extension paperwork on Thursday when he comes to office.  Patient updated that it will be Thursday before we have the prescription or signature but we are working on it.  Patient states understanding at this time.

## 2013-07-14 ENCOUNTER — Other Ambulatory Visit: Payer: Self-pay | Admitting: Gastroenterology

## 2013-07-15 ENCOUNTER — Telehealth (INDEPENDENT_AMBULATORY_CARE_PROVIDER_SITE_OTHER): Payer: Self-pay | Admitting: General Surgery

## 2013-07-15 ENCOUNTER — Other Ambulatory Visit (INDEPENDENT_AMBULATORY_CARE_PROVIDER_SITE_OTHER): Payer: Self-pay | Admitting: Surgery

## 2013-07-15 DIAGNOSIS — R1012 Left upper quadrant pain: Secondary | ICD-10-CM

## 2013-07-15 MED ORDER — OXYCODONE-ACETAMINOPHEN 5-325 MG PO TABS: 1.0000 | ORAL_TABLET | ORAL | Status: DC | PRN

## 2013-07-15 NOTE — Telephone Encounter (Signed)
Pt of Dr. Daphine Deutscher has been to Dr. Bosie Clos for work up and he cannot determine source or reason for her pain.  Dr. Bosie Clos has sent his report to Dr. Daphine Deutscher.  Pt wants to know plan going forward from here and to address status of her disability.  Pt aware Dr. Daphine Deutscher is at the hospital today, so may be awhile getting back to her.

## 2013-07-22 ENCOUNTER — Ambulatory Visit (INDEPENDENT_AMBULATORY_CARE_PROVIDER_SITE_OTHER): Payer: 59 | Admitting: Surgery

## 2013-07-22 ENCOUNTER — Encounter (INDEPENDENT_AMBULATORY_CARE_PROVIDER_SITE_OTHER): Payer: Self-pay | Admitting: Surgery

## 2013-07-22 VITALS — BP 138/82 | HR 94 | Temp 98.0°F | Resp 18 | Ht 64.0 in | Wt 132.0 lb

## 2013-07-22 DIAGNOSIS — R109 Unspecified abdominal pain: Secondary | ICD-10-CM

## 2013-07-22 NOTE — Progress Notes (Signed)
Kari Gibbs 62 y.o.  Body mass index is 22.65 kg/(m^2).  Patient Active Problem List   Diagnosis Date Noted  . S/P laparoscopic cholecystectomy Oct 2014 05/18/2013  . 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 Abdominal pain 01/15/2012  . CLOSTRIDIUM DIFFICILE COLITIS 05/01/2010  . DEPRESSION 05/01/2010  . HYPERTENSION 05/01/2010  . GERD 05/01/2010  . OSTEOARTHRITIS 05/01/2010  . 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 ALLERGIC RHINITIS 06/26/2007    Allergies  Allergen Reactions  . Iohexol      Code: HIVES, Desc: Pt has had a prior IVP dye reaction,(no scans in health system).Symptoms were hives,and airway obstruction!, Onset Date: 40981191   . Ace Inhibitors     REACTION: cyclic cough  . Losartan Potassium Other (See Comments)    Raises blood pressure; fatigue    Past Surgical History  Procedure Laterality Date  . Gastric bypass  11/13/2009  . Exploratory laparotomy  2012  . Tmj- left--about 10 yrs ago--left side--pt now has some pain left jaw Left     still has pain, scar tissue has been removed  . Laparoscopy  03/04/2012    Procedure: LAPAROSCOPY DIAGNOSTIC;  Surgeon: Valarie Merino, MD;  Location: [facility];  Service: General;  Laterality: N/A;  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  . Hernia repair  2011    hiatal  . Abdominal hysterectomy  1988    partial  . Cholecystectomy N/A 05/18/2013    Procedure: LAPAROSCOPIC CHOLECYSTECTOMY WITH INTRAOPERATIVE CHOLANGIOGRAM/laparoscopy/ ;  Surgeon: Valarie Merino, MD;  Location: [facility];  Service: General;  Laterality: N/A;   Gweneth Dimitri, MD 1. Abdominal  pain, other specified site     [name] continues to have a fluttering cramping pain in her upper abdomen radiating into her back. At times there are sharp sensations at almost down neuropathic. With radiation into her back, sounds like pancreatitis. We have been unable to find anything to suggest that. Her CT scan recently didn't  show a cause for her pain. Her upper endoscopy by Doy Mince was negative.  She is on Diovan (Valsartan) and that can cause pain -- Will  stop Valsartan. I think that her present weight is doubtful that she should have much in the way of hypertension. We will check that and for right now wanted her to stop her Diovan.  She'll have her MRI on December 19 and I will speak with her the following week. We'll decide what else we need to do. I will refill her pain pills as needed issues using a sparingly. I want her to remain out of work  at least until January 2. Matt B. Daphine Deutscher, MD, [facility] [phone] beeper [phone]  07/22/2013 1:41 PM

## 2013-07-22 NOTE — Patient Instructions (Signed)
Call office on the Monday after your MRI on Friday. Remain out of work until August 13, 2013.

## 2013-07-30 ENCOUNTER — Encounter (INDEPENDENT_AMBULATORY_CARE_PROVIDER_SITE_OTHER): Payer: Self-pay

## 2013-07-30 ENCOUNTER — Ambulatory Visit
Admission: RE | Admit: 2013-07-30 | Discharge: 2013-07-30 | Disposition: A | Discharge status: Home / Self Care | Payer: 59 | Source: Ambulatory Visit | Attending: Surgery | Admitting: Surgery

## 2013-07-30 DIAGNOSIS — R109 Unspecified abdominal pain: Secondary | ICD-10-CM

## 2013-07-30 MED ORDER — GADOBENATE DIMEGLUMINE 529 MG/ML IV SOLN
12.0000 mL | Freq: Once | INTRAVENOUS | Status: AC | PRN
  Administered 2013-07-30: 12 mL via INTRAVENOUS

## 2013-08-02 ENCOUNTER — Telehealth (INDEPENDENT_AMBULATORY_CARE_PROVIDER_SITE_OTHER): Payer: Self-pay | Admitting: General Surgery

## 2013-08-02 NOTE — Telephone Encounter (Signed)
Spoke with pt and informed her that her MRI showed no cause for the abdominal pain that she is still having.  Explained to her that I would review her case with Dr. Daphine Deutscher when he gets back in the office and then I would give her a call to let her know what he wants to do from here.

## 2013-08-02 NOTE — Telephone Encounter (Signed)
Message copied by Ignacia Marvel on Mon Aug 02, 2013 12:28 PM ------      Message from: Zacarias Pontes      Created: Mon Aug 02, 2013 10:16 AM       Per pt, she was told to call this morning and get her test results, Liborio Nixon ------

## 2013-08-04 NOTE — Telephone Encounter (Signed)
Called pt and informed her that Dr. Daphine Deutscher wants to see her back in the office to discuss some options with her.  She explained that she will need him to adjust the date on her short term disability forms.  He agreed to keep her out until 08/21/13 for after his appt with her on 08/20/13 at 10:50.  Informed that patient to call Friday 08/09/13 and ask to speak with the medical records department in regards to changing the date on her short term disability forms.  Informed her that they can handle it from there. She was agreeable with this plan of action.

## 2013-08-09 ENCOUNTER — Telehealth (INDEPENDENT_AMBULATORY_CARE_PROVIDER_SITE_OTHER): Payer: Self-pay | Admitting: General Surgery

## 2013-08-09 NOTE — Telephone Encounter (Signed)
Pt called for pain medicine for the pain that has settled in her ribs.  She has researched her symptoms on the internet and thinks she meets the criteria for costochondritis.  Her appt with Dr. Daphine Deutscher is on 08/20/13.  Please advise if she can get pain meds.

## 2013-08-11 ENCOUNTER — Telehealth (INDEPENDENT_AMBULATORY_CARE_PROVIDER_SITE_OTHER): Payer: Self-pay | Admitting: Surgery

## 2013-08-11 DIAGNOSIS — R1012 Left upper quadrant pain: Secondary | ICD-10-CM

## 2013-08-11 MED ORDER — OXYCODONE-ACETAMINOPHEN 5-325 MG PO TABS: 1.0000 | ORAL_TABLET | ORAL | Status: DC | PRN

## 2013-08-11 NOTE — Telephone Encounter (Signed)
Pt called again complaining of L rib pain.  States she thinks it is costochondritis.  She has tried tylenol and can't take anti-inflammatories due to her gastric bypass surgery.  She has tried ice and heat as well.  I told her I would send a message to Dr Daphine Deutscher.  She will come to the ED to be evaluated if the pain gets worse.

## 2013-08-13 ENCOUNTER — Telehealth (INDEPENDENT_AMBULATORY_CARE_PROVIDER_SITE_OTHER): Payer: Self-pay | Admitting: Surgery

## 2013-08-13 NOTE — Telephone Encounter (Signed)
Message copied by Flossie Buffy on Fri Aug 13, 2013  8:47 AM ------      Message from: Jeanella Craze      Created: Mon Aug 09, 2013 10:54 AM      Regarding: Carter Kitten: [phone]       The pain that the patient is experiencing is constant. She has been doing a lot of research. It has settled in her ribs. This has been going on for 3 years and she stated that she would like to figure this out. She wants Dr. Hassell Done to consider costochondritis. Please call to discuss. Thank you. ------

## 2013-08-13 NOTE — Telephone Encounter (Signed)
Returned patients call and informed her that Dr. Ellsworth Lennox a refill on her pain medication and that it is ready for pickup at our front desk.

## 2013-08-20 ENCOUNTER — Encounter (INDEPENDENT_AMBULATORY_CARE_PROVIDER_SITE_OTHER): Payer: Self-pay | Admitting: Surgery

## 2013-08-20 ENCOUNTER — Telehealth (INDEPENDENT_AMBULATORY_CARE_PROVIDER_SITE_OTHER): Payer: Self-pay | Admitting: *Deleted

## 2013-08-20 ENCOUNTER — Ambulatory Visit (INDEPENDENT_AMBULATORY_CARE_PROVIDER_SITE_OTHER): Payer: 59 | Admitting: Surgery

## 2013-08-20 VITALS — BP 132/88 | HR 68 | Temp 97.6°F | Resp 14 | Ht 66.0 in | Wt 130.0 lb

## 2013-08-20 DIAGNOSIS — R1012 Left upper quadrant pain: Secondary | ICD-10-CM

## 2013-08-20 DIAGNOSIS — R0781 Pleurodynia: Secondary | ICD-10-CM

## 2013-08-20 DIAGNOSIS — R079 Chest pain, unspecified: Secondary | ICD-10-CM

## 2013-08-20 MED ORDER — OXYCODONE-ACETAMINOPHEN 5-325 MG PO TABS: 1.0000 | ORAL_TABLET | ORAL | Status: DC | PRN

## 2013-08-20 NOTE — Progress Notes (Signed)
Kari Gibbs 63 y.o.  Body mass index is 20.99 kg/(m^2).  Patient Active Problem List   Diagnosis Date Noted  . S/P laparoscopic cholecystectomy Oct 2014 05/18/2013  . 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 Abdominal pain 01/15/2012  . CLOSTRIDIUM DIFFICILE COLITIS 05/01/2010  . DEPRESSION 05/01/2010  . HYPERTENSION 05/01/2010  . GERD 05/01/2010  . OSTEOARTHRITIS 05/01/2010  . 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 ALLERGIC RHINITIS 06/26/2007    Allergies  Allergen Reactions  . Iohexol      Code: HIVES, Desc: Pt has had a prior IVP dye reaction,(no scans in health system).Symptoms were hives,and airway obstruction!, Onset Date: [id number]   . Ace Inhibitors     REACTION: cyclic cough  . Losartan Potassium Other (See Comments)    Raises blood pressure; fatigue    Past Surgical History  Procedure Laterality Date  . Gastric bypass  11/13/2009  . Exploratory laparotomy  2012  . Tmj- left--about 10 yrs ago--left side--pt now has some pain left jaw Left     still has pain, scar tissue has been removed  . 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 Hernia repair  2011    hiatal  . Abdominal hysterectomy  1988    partial  . Cholecystectomy N/A 05/18/2013    Procedure: LAPAROSCOPIC CHOLECYSTECTOMY WITH INTRAOPERATIVE CHOLANGIOGRAM/laparoscopy/ ;  Surgeon: Pedro Earls, MD;  Location: [facility];  Service: General;  Laterality: N/A;   MCNEILL,WENDY, MD No diagnosis found.  Persistant left upper quadrant pain along the t-10 thru t-12 nerve root.  Nothing palpable along edge of the rib.  Will get MR of the T spine to look for lesion impinging on the nerve root.  She is unable to work in this much pain.  Will keep out of work until this study can be done and I can see her back in the office.  She  requests a refill on her pain medication.    I don't see anything on the abdominal MRI to account for her pain.    MR of t spine.  followup after that.  Keep out of work pending workup.    Matt B. Hassell Done, MD, [facility] [phone] beeper [phone]  08/20/2013 11:28 AM

## 2013-08-20 NOTE — Addendum Note (Signed)
Addended by: Dois Davenport on: 08/20/2013 11:44 AM   Modules accepted: Orders

## 2013-08-20 NOTE — Telephone Encounter (Signed)
I spoke with pt and informed her of the appt for her MRI at [facility] on 08/26/13 with an arrival time of 5:15pm.  I instructed pt that she will be having her labs drawn prior to the MRI.  She is agreeable with this appt.

## 2013-08-26 ENCOUNTER — Telehealth (INDEPENDENT_AMBULATORY_CARE_PROVIDER_SITE_OTHER): Payer: Self-pay | Admitting: *Deleted

## 2013-08-26 ENCOUNTER — Inpatient Hospital Stay: Admission: RE | Admit: 2013-08-26 | Payer: 59 | Source: Ambulatory Visit

## 2013-08-26 ENCOUNTER — Other Ambulatory Visit (INDEPENDENT_AMBULATORY_CARE_PROVIDER_SITE_OTHER): Payer: Self-pay | Admitting: *Deleted

## 2013-08-26 ENCOUNTER — Ambulatory Visit
Admission: RE | Admit: 2013-08-26 | Discharge: 2013-08-26 | Disposition: A | Discharge status: Home / Self Care | Payer: 59 | Source: Ambulatory Visit | Attending: Surgery | Admitting: Surgery

## 2013-08-26 DIAGNOSIS — R0781 Pleurodynia: Secondary | ICD-10-CM

## 2013-08-26 DIAGNOSIS — R1012 Left upper quadrant pain: Secondary | ICD-10-CM

## 2013-08-26 NOTE — Telephone Encounter (Signed)
Patient called to ask for a refill of Percocet.  She states that she will run out on Sunday evening so wanted to go ahead and get a refill.  Patient states continued left sided rib pain that is becoming increasingly worse.  Explained that I would send a message on to Dr. Hassell Done to ask about this refill then we will let her know as soon as we have an answer.  Patient states understanding and agreeable.

## 2013-08-30 MED ORDER — OXYCODONE-ACETAMINOPHEN 5-325 MG PO TABS: 1.0000 | ORAL_TABLET | ORAL | Status: DC | PRN

## 2013-08-30 NOTE — Telephone Encounter (Signed)
Pt called to notify her that I have a written rx for Oxycodone 5/325mg  #30 per Dr Redmond Pulling. I will put rx at the front desk for p/u. The pt understands.

## 2013-08-30 NOTE — Addendum Note (Signed)
Addended by: Redmond Pulling, Jasleen Riepe M on: 08/30/2013 02:17 PM   Modules accepted: Orders

## 2013-08-30 NOTE — Telephone Encounter (Signed)
Patient is calling back again and wants a Rx for Percocet. She stated that she is out. She called last week. Please call and advise about pain med

## 2013-08-30 NOTE — Telephone Encounter (Signed)
Will give 1 refill of percocet. Will let Dr Hassell Done know

## 2013-09-06 ENCOUNTER — Telehealth (INDEPENDENT_AMBULATORY_CARE_PROVIDER_SITE_OTHER): Payer: Self-pay | Admitting: General Surgery

## 2013-09-06 NOTE — Telephone Encounter (Signed)
Pt called to request a refill.  She is taking Percocet 5/325 mg, 1 TID, now.  She will run out before her OV on Friday.  Please advise.

## 2013-09-10 ENCOUNTER — Encounter (INDEPENDENT_AMBULATORY_CARE_PROVIDER_SITE_OTHER): Payer: Self-pay

## 2013-09-10 ENCOUNTER — Ambulatory Visit (INDEPENDENT_AMBULATORY_CARE_PROVIDER_SITE_OTHER): Payer: 59 | Admitting: Surgery

## 2013-09-10 ENCOUNTER — Other Ambulatory Visit (INDEPENDENT_AMBULATORY_CARE_PROVIDER_SITE_OTHER): Payer: Self-pay | Admitting: Surgery

## 2013-09-10 ENCOUNTER — Telehealth (INDEPENDENT_AMBULATORY_CARE_PROVIDER_SITE_OTHER): Payer: Self-pay | Admitting: General Surgery

## 2013-09-10 DIAGNOSIS — R1012 Left upper quadrant pain: Secondary | ICD-10-CM

## 2013-09-10 DIAGNOSIS — R0781 Pleurodynia: Secondary | ICD-10-CM

## 2013-09-10 MED ORDER — OXYCODONE-ACETAMINOPHEN 5-325 MG PO TABS
1.0000 | ORAL_TABLET | ORAL | Status: DC | PRN
Start: 2013-09-10 — End: 2013-09-22

## 2013-09-10 NOTE — Telephone Encounter (Signed)
Had to cancel office for surgery.  Refilled pain med and extended short term disability.   I spoke with Dr. Addison Lank about Ms. Freelove and trying to find a musculoskeletal reason for her left sided pain.

## 2013-09-10 NOTE — Telephone Encounter (Signed)
Spoke with the patient and informed her that we are having to cancel her appt today due to Dr. Hassell Done being called into an emergency surgery.  She was unhappy with this because her Short term disability forms must be switched today in order to keep her out longer.  She is also having pain still and needs a refill on pain medication.  I explained that I will have to call her later with a new appt after I work out a time with Dr. Hassell Done.  Informed her that I would try and get in touch with him today in regards to her Short term disability form and pain medication.

## 2013-09-16 NOTE — Telephone Encounter (Signed)
I spoke with pt and she stated that she has gotten an extension through her [employer] company to be out through 09/30/13 when she can come back in and see Dr. Hassell Done.  She does have a form that must be filled out to honor this extension and I advised her to bring this form by tomorrow.  She is asking for a sooner appt so that she can possibly go back to work sooner than 09/30/13.

## 2013-09-21 ENCOUNTER — Telehealth (INDEPENDENT_AMBULATORY_CARE_PROVIDER_SITE_OTHER): Payer: Self-pay | Admitting: *Deleted

## 2013-09-21 NOTE — Telephone Encounter (Signed)
Patient called to request a refill of her Percocet.  Patient reports that she tried to make the last prescription on 09/10/13 last as long as she could.  Patient reports she really needs this prescription as soon as possible.  I explained that I will send a message to Dr. Hassell Done to ask his opinion then we will let her know.  Explained that Dr. Hassell Done is not in the office today but in the OR so he will get the message hopefully in between cases.  Patient states understanding and agreeable at this time.

## 2013-09-22 ENCOUNTER — Other Ambulatory Visit (INDEPENDENT_AMBULATORY_CARE_PROVIDER_SITE_OTHER): Payer: Self-pay | Admitting: Surgery

## 2013-09-22 DIAGNOSIS — R0781 Pleurodynia: Secondary | ICD-10-CM

## 2013-09-22 DIAGNOSIS — R1012 Left upper quadrant pain: Secondary | ICD-10-CM

## 2013-09-22 MED ORDER — OXYCODONE-ACETAMINOPHEN 5-325 MG PO TABS: 1.0000 | ORAL_TABLET | ORAL | Status: DC | PRN

## 2013-09-22 NOTE — Telephone Encounter (Signed)
Called requesting refill of Percocet.  I spoke with Dr. Addison Lank and she was having MR reviewed.  This showed old rib fractures on the left side.  Will need to coordinate care with Dr. Leonides Schanz.

## 2013-09-22 NOTE — Telephone Encounter (Signed)
Called to make patient aware that pain medication refill will be at the front desk for pick up.

## 2013-09-30 ENCOUNTER — Ambulatory Visit (INDEPENDENT_AMBULATORY_CARE_PROVIDER_SITE_OTHER): Payer: 59 | Admitting: Surgery

## 2013-09-30 ENCOUNTER — Encounter (INDEPENDENT_AMBULATORY_CARE_PROVIDER_SITE_OTHER): Payer: Self-pay | Admitting: Surgery

## 2013-09-30 VITALS — BP 136/82 | HR 78 | Temp 97.8°F | Resp 16

## 2013-09-30 DIAGNOSIS — R1012 Left upper quadrant pain: Secondary | ICD-10-CM

## 2013-09-30 DIAGNOSIS — R0781 Pleurodynia: Secondary | ICD-10-CM

## 2013-09-30 DIAGNOSIS — R079 Chest pain, unspecified: Secondary | ICD-10-CM

## 2013-09-30 MED ORDER — OXYCODONE-ACETAMINOPHEN 5-325 MG PO TABS
1.0000 | ORAL_TABLET | ORAL | Status: DC | PRN
Start: 2013-09-30 — End: 2014-11-18

## 2013-09-30 NOTE — Progress Notes (Signed)
Kari Gibbs 63 y.o.  There is no weight on file to calculate BMI.  Patient Active Problem List   Diagnosis Date Noted  . S/P laparoscopic cholecystectomy Oct 2014 05/18/2013  . 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 Abdominal pain 01/15/2012  . DEPRESSION 05/01/2010  . HYPERTENSION 05/01/2010  . GERD 05/01/2010  . OSTEOARTHRITIS 05/01/2010  . 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 ALLERGIC RHINITIS 06/26/2007    Allergies  Allergen Reactions  . Iohexol      Code: HIVES, Desc: Pt has had a prior IVP dye reaction,(no scans in health system).Symptoms were hives,and airway obstruction!, Onset Date: [id number]   . Ace Inhibitors     REACTION: cyclic cough  . Losartan Potassium Other (See Comments)    Raises blood pressure; fatigue    Past Surgical History  Procedure Laterality Date  . Gastric bypass  11/13/2009  . Exploratory laparotomy  2012  . Tmj- left--about 10 yrs ago--left side--pt now has some pain left jaw Left     still has pain, scar tissue has been removed  . 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 Hernia repair  2011    hiatal  . Abdominal hysterectomy  1988    partial  . Cholecystectomy N/A 05/18/2013    Procedure: LAPAROSCOPIC CHOLECYSTECTOMY WITH INTRAOPERATIVE CHOLANGIOGRAM/laparoscopy/ ;  Surgeon: Pedro Earls, MD;  Location: [facility];  Service: General;  Laterality: N/A;   MCNEILL,WENDY, MD No diagnosis found.  [name] in today for a visit. Her weight today is 131.6 down from 283 preop. Her MR thoracic spine was reread and indicated there was a fracture in the anterolateral 8 to rehabilitation. She is having severe pain along that nerve dermatome. She had asked about having this area injected. She and her husband both seen Dr. Eddie Dibbles and I will get him to call  and make an appointment for her to see him about injection for pain. In the meantime I'll see her back in mid March. She asked me for a refill for pain med and I have reordered her Percocet #30.  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 Will refer to  Dr. Eddie Dibbles for therapy/injection. Matt B. Hassell Done, MD, [facility] [phone] beeper [phone]  09/30/2013 12:18 PM

## 2013-10-04 ENCOUNTER — Encounter (INDEPENDENT_AMBULATORY_CARE_PROVIDER_SITE_OTHER): Payer: Self-pay

## 2013-10-26 ENCOUNTER — Emergency Department (HOSPITAL_COMMUNITY): Payer: 59

## 2013-10-26 ENCOUNTER — Encounter (HOSPITAL_COMMUNITY): Payer: Self-pay | Admitting: Emergency Medicine

## 2013-10-26 ENCOUNTER — Emergency Department (HOSPITAL_COMMUNITY)
Admission: EM | Admit: 2013-10-26 | Discharge: 2013-10-26 | Disposition: A | Discharge status: Home / Self Care | Payer: 59 | Attending: Emergency Medicine | Admitting: Emergency Medicine

## 2013-10-26 DIAGNOSIS — F329 Major depressive disorder, single episode, unspecified: Secondary | ICD-10-CM | POA: Insufficient documentation

## 2013-10-26 DIAGNOSIS — Z90710 Acquired absence of both cervix and uterus: Secondary | ICD-10-CM | POA: Insufficient documentation

## 2013-10-26 DIAGNOSIS — G8929 Other chronic pain: Secondary | ICD-10-CM

## 2013-10-26 DIAGNOSIS — Z87442 Personal history of urinary calculi: Secondary | ICD-10-CM | POA: Insufficient documentation

## 2013-10-26 DIAGNOSIS — Z79899 Other long term (current) drug therapy: Secondary | ICD-10-CM | POA: Insufficient documentation

## 2013-10-26 DIAGNOSIS — M479 Spondylosis, unspecified: Secondary | ICD-10-CM | POA: Insufficient documentation

## 2013-10-26 DIAGNOSIS — Z9089 Acquired absence of other organs: Secondary | ICD-10-CM | POA: Insufficient documentation

## 2013-10-26 DIAGNOSIS — K219 Gastro-esophageal reflux disease without esophagitis: Secondary | ICD-10-CM | POA: Insufficient documentation

## 2013-10-26 DIAGNOSIS — K589 Irritable bowel syndrome without diarrhea: Secondary | ICD-10-CM | POA: Insufficient documentation

## 2013-10-26 DIAGNOSIS — I10 Essential (primary) hypertension: Secondary | ICD-10-CM | POA: Insufficient documentation

## 2013-10-26 DIAGNOSIS — R0789 Other chest pain: Secondary | ICD-10-CM | POA: Insufficient documentation

## 2013-10-26 DIAGNOSIS — Z9889 Other specified postprocedural states: Secondary | ICD-10-CM | POA: Insufficient documentation

## 2013-10-26 DIAGNOSIS — F411 Generalized anxiety disorder: Secondary | ICD-10-CM | POA: Insufficient documentation

## 2013-10-26 DIAGNOSIS — R079 Chest pain, unspecified: Secondary | ICD-10-CM

## 2013-10-26 DIAGNOSIS — F3289 Other specified depressive episodes: Secondary | ICD-10-CM | POA: Insufficient documentation

## 2013-10-26 DIAGNOSIS — Z8701 Personal history of pneumonia (recurrent): Secondary | ICD-10-CM | POA: Insufficient documentation

## 2013-10-26 MED ORDER — OXYCODONE-ACETAMINOPHEN 5-325 MG PO TABS
1.0000 | ORAL_TABLET | Freq: Once | ORAL | Status: AC
  Administered 2013-10-26: 1 via ORAL
  Filled 2013-10-26: qty 1

## 2013-10-26 MED ORDER — OXYCODONE-ACETAMINOPHEN 5-325 MG PO TABS: 1.0000 | ORAL_TABLET | Freq: Four times a day (QID) | ORAL | Status: DC | PRN

## 2013-10-26 NOTE — ED Provider Notes (Signed)
CSN: [id number]     Arrival date & time 10/26/13  1451 History   First MD Initiated Contact with Patient 10/26/13 1948     Chief Complaint  Patient presents with  . Flank Pain     (Consider location/radiation/quality/duration/timing/severity/associated sxs/prior Treatment) HPI Complains of left-sided rib pain anterior and posterior, nonradiating worse with changing position or with deep inspiration since 10/07/20014 pain became worse today after she received intercostal injection at her orthopedist office. Time of intercostal injection approximately 12 noon today. Pain is improved with remaining still. Sharp. Nonradiating. He was injected at her left anterior and left posterior chest today. No shortness of breath no other associated symptoms. She reports that she had a chest x-ray after the injection at the orthopedist office which was read as negative. Past Medical History  Diagnosis Date  . Fatigue   . Nausea & vomiting   . IBS (irritable bowel syndrome)   . Abdominal pain   . Hypertension   . Depression     MOSTLY IN THE FALL OF THE YEAR  . Pneumonia     WHEN PT IN 6 TH GRADE  . Headache(784.0)     HX OF MIGRAINES  . Arthritis     OA LOWER SPINE  . SBO (small bowel obstruction) 2013  . Hiatal hernia 09/2009  . 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 Gastritis     Distal gastritis  . Anxiety   . GERD (gastroesophageal reflux disease)     "told that she does"  . History of kidney stones    Past Surgical History  Procedure Laterality Date  . Gastric bypass  11/13/2009  . Exploratory laparotomy  2012  . Tmj- left--about 10 yrs ago--left side--pt now has some pain left jaw Left     still has pain, scar tissue has been removed  . 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 Hernia repair  2011    hiatal   . Abdominal hysterectomy  1988    partial  . Cholecystectomy N/A 05/18/2013    Procedure: LAPAROSCOPIC CHOLECYSTECTOMY WITH INTRAOPERATIVE CHOLANGIOGRAM/laparoscopy/ ;  Surgeon: Pedro Earls, MD;  Location: [facility];  Service: General;  Laterality: N/A;   No family history on file. History  Substance Use Topics  . Smoking status: Never Smoker   . Smokeless tobacco: Never Used  . Alcohol Use: No   OB History   Grav Para Term Preterm Abortions TAB SAB Ect Mult Living                 Review of Systems  Constitutional: Negative.   HENT: Negative.   Respiratory: Negative.   Cardiovascular: Positive for chest pain.  Gastrointestinal: Negative.   Musculoskeletal: Negative.   Skin: Negative.   Neurological: Negative.   Psychiatric/Behavioral: Negative.   All other systems reviewed and are negative.      Allergies  Iohexol; Ace inhibitors; and Losartan potassium  Home Medications   Current Outpatient Rx  Name  Route  Sig  Dispense  Refill  . Cholecalciferol (WELLESSE VITAMIN D3) 1000 UNIT/10ML LIQD   Oral   Take 15-30 mLs by mouth 2 (two) times daily. 2 tablespoon in the morning and 1 in the afternoon         . Cyanocobalamin (VITAMIN B-12) 500 MCG SUBL  Sublingual   Place 500 mcg under the tongue daily.         Marland Kitchen LORazepam (ATIVAN) 0.5 MG tablet               . omeprazole (PRILOSEC) 20 MG capsule   Oral   Take 20 mg by mouth 2 (two) times daily.         Marland Kitchen OVER THE COUNTER MEDICATION      1 capsule. Wellesse calcium 1000, vitamin d, magnessium         . oxyCODONE-acetaminophen (ROXICET) 5-325 MG per tablet   Oral   Take 1 tablet by mouth every 4 (four) hours as needed for severe pain.   30 tablet   0   . PREMARIN vaginal cream               . Probiotic Product (ALIGN) 4 MG CAPS   Oral   Take 1 capsule by mouth at bedtime. Per Pt.         . sucralfate (CARAFATE) 1 GM/10ML suspension   Oral   Take 10 mLs (1 g total) by mouth 4 (four)  times daily.   420 mL   0   . Tapentadol HCl (NUCYNTA) 100 MG TABS   Oral   Take 100 mg by mouth 2 (two) times daily.         Marland Kitchen UNABLE TO FIND      Take 1 capsule by mouth daily as needed. Med Name: Nature's Way Pepogest (Peppermint oil)         . valsartan (DIOVAN) 40 MG tablet   Oral   Take 40 mg by mouth every morning.         . venlafaxine XR (EFFEXOR-XR) 75 MG 24 hr capsule   Oral   Take 225 mg by mouth every morning.         . Vitamin D, Ergocalciferol, (DRISDOL) 50000 UNITS CAPS capsule   Oral   Take 50,000 Units by mouth every Thursday.          BP 139/77  Pulse 91  Temp(Src) 98.3 F (36.8 C) (Oral)  Resp 15  SpO2 100% Physical Exam  Nursing note and vitals reviewed. Constitutional: She appears well-developed and well-nourished.  HENT:  Head: Normocephalic and atraumatic.  Eyes: Conjunctivae are normal. Pupils are equal, round, and reactive to light.  Neck: Neck supple. No tracheal deviation present. No thyromegaly present.  Cardiovascular: Normal rate and regular rhythm.   No murmur heard. Pulmonary/Chest: Effort normal and breath sounds normal. She exhibits tenderness.  Left chest wall is tender anteriorly and posteriorly. Pain is exacerbated when she sits up from a supine position.  Abdominal: Soft. Bowel sounds are normal. She exhibits no distension. There is no tenderness.  Musculoskeletal: Normal range of motion. She exhibits no edema and no tenderness.  Neurological: She is alert. Coordination normal.  Skin: Skin is warm and dry. No rash noted.  Psychiatric: She has a normal mood and affect.    ED Course  Procedures (including critical care time) Labs Review Labs Reviewed - No data to display Imaging Review Dg Chest 2 View  10/26/2013   CLINICAL DATA:  Left-sided flank pain  EXAM: CHEST  2 VIEW  COMPARISON:  Chest x-ray of 05/11/2013  FINDINGS: No active infiltrate or effusion is seen. Mediastinal contours appear normal and the heart is  within normal limits in size. No acute bony abnormality is seen.  IMPRESSION: No active cardiopulmonary disease.   Electronically Signed  By: Ivar Drape M.D.   On: 10/26/2013 16:17     EKG Interpretation   Date/Time:  Tuesday October 26 2013 20:04:56 EDT Ventricular Rate:  91 PR Interval:  129 QRS Duration: 82 QT Interval:  397 QTC Calculation: 488 R Axis:   82 Text Interpretation:  Sinus rhythm Ventricular premature complex  Borderline right axis deviation Consider left ventricular hypertrophy  Borderline prolonged QT interval No significant change since last tracing  Confirmed by Winfred Leeds  MD, Lamoine Fredricksen [phone]) on 10/26/2013 8:44:40 PM     Chest xray viewed by me MDM   Final diagnoses:  None   note evidence of pneumothorax. Plan prescription Percocet Followup Dr. Addison Lank suggest pain management clinic Diagnosis chronic chest pain     Orlie Dakin, MD 10/26/13 2046

## 2013-10-26 NOTE — ED Notes (Signed)
PT was sent from [place] office.  Pt has been getting pain management injection at 5 ICS and T10 today and patient did not get any relief.  No respiratory.  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  Concerned for Pneumothorax- did chest xray that was negative

## 2013-10-26 NOTE — ED Notes (Signed)
Jacubowitz, MD at bedside.  

## 2013-10-26 NOTE — ED Notes (Signed)
Dr. Eddie Dibbles called about patient and advised to please have MD update him or contact him with any questions.   [phone]

## 2013-10-26 NOTE — ED Notes (Addendum)
Pt states that she had her gallbladder removed 7th of October, and reports that something happened while in the hospital to cause her 8th rib on the left side to fracture. The fracture has healed, but is now dealing with chronic pain. Pt states that she received two injections today for her pain, one on her RUQ on one on her left mid back. Pt states that the pain was relieved for a short amount of time, but her pain became worse over the last few hours her pain became worse. Dr. Eddie Dibbles sent the pt here from his office.

## 2013-10-26 NOTE — Discharge Instructions (Signed)
Call Dr. Addison Lank tomorrow to arrange an office visit or if you need any medication refills. Ask about possible referral to pain management clinic

## 2013-10-26 NOTE — ED Notes (Signed)
Pt denies pain with deep breath.  Auscultated breath sounds and bilateral but may be diminished a little on left side

## 2013-10-28 ENCOUNTER — Encounter (INDEPENDENT_AMBULATORY_CARE_PROVIDER_SITE_OTHER): Payer: Self-pay | Admitting: Surgery

## 2013-10-28 ENCOUNTER — Ambulatory Visit (INDEPENDENT_AMBULATORY_CARE_PROVIDER_SITE_OTHER): Payer: 59 | Admitting: Surgery

## 2013-10-28 VITALS — BP 130/72 | HR 74 | Temp 97.8°F | Resp 14 | Ht 66.0 in | Wt 128.0 lb

## 2013-10-28 DIAGNOSIS — S2232XA Fracture of one rib, left side, initial encounter for closed fracture: Secondary | ICD-10-CM

## 2013-10-28 DIAGNOSIS — S2239XA Fracture of one rib, unspecified side, initial encounter for closed fracture: Secondary | ICD-10-CM

## 2013-10-28 NOTE — Progress Notes (Signed)
Kari Gibbs 63 y.o.  Body mass index is 20.67 kg/(m^2).  Patient Active Problem List   Diagnosis Date Noted  . S/P laparoscopic cholecystectomy Oct 2014 05/18/2013  . 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 Abdominal pain 01/15/2012  . DEPRESSION 05/01/2010  . HYPERTENSION 05/01/2010  . GERD 05/01/2010  . OSTEOARTHRITIS 05/01/2010  . 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 ALLERGIC RHINITIS 06/26/2007    Allergies  Allergen Reactions  . Iohexol      Code: HIVES, Desc: Pt has had a prior IVP dye reaction,(no scans in health system).Symptoms were hives,and airway obstruction!, Onset Date: [id number]   . Ace Inhibitors     REACTION: cyclic cough  . Losartan Potassium Other (See Comments)    Raises blood pressure; fatigue    Past Surgical History  Procedure Laterality Date  . Gastric bypass  11/13/2009  . Exploratory laparotomy  2012  . Tmj- left--about 10 yrs ago--left side--pt now has some pain left jaw Left     still has pain, scar tissue has been removed  . 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 Hernia repair  2011    hiatal  . Abdominal hysterectomy  1988    partial  . Cholecystectomy N/A 05/18/2013    Procedure: LAPAROSCOPIC CHOLECYSTECTOMY WITH INTRAOPERATIVE CHOLANGIOGRAM/laparoscopy/ ;  Surgeon: Pedro Earls, MD;  Location: [facility];  Service: General;  Laterality: N/A;   MCNEILL,WENDY, MD No diagnosis found.  Weight is 128 lbs.  [name] looks better and is undergoing blocks along her chronic fractured left ribs.  This is helping and [name] is directing this.  She is not ready to go back to work and will be out on short term disability at least until April 4th.  I will see her again in 6 weeks unless needed before.   Matt B. Hassell Done, MD, [facility] [phone] beeper [phone]  10/28/2013 12:51 PM

## 2013-11-11 ENCOUNTER — Other Ambulatory Visit (HOSPITAL_COMMUNITY): Payer: Self-pay | Admitting: *Deleted

## 2013-11-15 ENCOUNTER — Other Ambulatory Visit (HOSPITAL_COMMUNITY): Payer: Self-pay | Admitting: Orthopedic Surgery

## 2013-11-16 ENCOUNTER — Other Ambulatory Visit (HOSPITAL_COMMUNITY): Payer: Self-pay | Admitting: Orthopedic Surgery

## 2013-11-16 DIAGNOSIS — R0781 Pleurodynia: Secondary | ICD-10-CM

## 2013-11-24 ENCOUNTER — Other Ambulatory Visit (HOSPITAL_COMMUNITY): Payer: Self-pay | Admitting: Orthopedic Surgery

## 2013-11-24 ENCOUNTER — Encounter (HOSPITAL_COMMUNITY)
Admission: RE | Admit: 2013-11-24 | Discharge: 2013-11-24 | Disposition: A | Discharge status: Home / Self Care | Payer: 59 | Source: Ambulatory Visit | Attending: Orthopedic Surgery | Admitting: Orthopedic Surgery

## 2013-11-24 ENCOUNTER — Encounter (HOSPITAL_COMMUNITY): Payer: Self-pay

## 2013-11-24 ENCOUNTER — Ambulatory Visit (HOSPITAL_COMMUNITY)
Admission: RE | Admit: 2013-11-24 | Discharge: 2013-11-24 | Disposition: A | Discharge status: Home / Self Care | Payer: 59 | Source: Ambulatory Visit | Attending: Orthopedic Surgery | Admitting: Orthopedic Surgery

## 2013-11-24 DIAGNOSIS — R0781 Pleurodynia: Secondary | ICD-10-CM

## 2013-11-24 DIAGNOSIS — R05 Cough: Secondary | ICD-10-CM | POA: Insufficient documentation

## 2013-11-24 DIAGNOSIS — Z9884 Bariatric surgery status: Secondary | ICD-10-CM | POA: Insufficient documentation

## 2013-11-24 DIAGNOSIS — J984 Other disorders of lung: Secondary | ICD-10-CM | POA: Insufficient documentation

## 2013-11-24 DIAGNOSIS — J9819 Other pulmonary collapse: Secondary | ICD-10-CM | POA: Insufficient documentation

## 2013-11-24 DIAGNOSIS — R079 Chest pain, unspecified: Secondary | ICD-10-CM | POA: Insufficient documentation

## 2013-11-24 DIAGNOSIS — N200 Calculus of kidney: Secondary | ICD-10-CM | POA: Insufficient documentation

## 2013-11-24 DIAGNOSIS — R0602 Shortness of breath: Secondary | ICD-10-CM | POA: Insufficient documentation

## 2013-11-24 DIAGNOSIS — R059 Cough, unspecified: Secondary | ICD-10-CM | POA: Insufficient documentation

## 2013-11-24 DIAGNOSIS — Z9089 Acquired absence of other organs: Secondary | ICD-10-CM | POA: Insufficient documentation

## 2013-11-24 MED ORDER — TECHNETIUM TC 99M MEDRONATE IV KIT
23.9000 | PACK | Freq: Once | INTRAVENOUS | Status: AC | PRN
  Administered 2013-11-24: 23.9 via INTRAVENOUS

## 2013-12-09 ENCOUNTER — Ambulatory Visit (INDEPENDENT_AMBULATORY_CARE_PROVIDER_SITE_OTHER): Payer: 59 | Admitting: Surgery

## 2014-01-13 ENCOUNTER — Other Ambulatory Visit: Payer: Self-pay | Admitting: Pain Medicine

## 2014-01-13 ENCOUNTER — Ambulatory Visit: Payer: Self-pay | Admitting: Pain Medicine

## 2014-01-13 LAB — MAGNESIUM: Magnesium: 1.9 mg/dL

## 2014-01-13 LAB — BASIC METABOLIC PANEL
Anion Gap: 3 — ABNORMAL LOW (ref 7–16)
BUN: 16 mg/dL (ref 7–18)
Calcium, Total: 9.5 mg/dL (ref 8.5–10.1)
Chloride: 104 mmol/L (ref 98–107)
Co2: 31 mmol/L (ref 21–32)
Creatinine: 0.87 mg/dL (ref 0.60–1.30)
EGFR (African American): >60
EGFR (Non-African Amer.): >60
Glucose: 97 mg/dL (ref 65–99)
Osmolality: 277 (ref 275–301)
Potassium: 3.6 mmol/L (ref 3.5–5.1)
Sodium: 138 mmol/L (ref 136–145)

## 2014-01-13 LAB — HEPATIC FUNCTION PANEL A (ARMC)
Albumin: 4.1 g/dL (ref 3.4–5.0)
Alkaline Phosphatase: 94 U/L
Bilirubin, Direct: 0.1 mg/dL (ref 0.00–0.20)
Bilirubin,Total: 0.6 mg/dL (ref 0.2–1.0)
SGOT(AST): 38 U/L — ABNORMAL HIGH (ref 15–37)
SGPT (ALT): 27 U/L (ref 12–78)
Total Protein: 7.4 g/dL (ref 6.4–8.2)

## 2014-01-13 LAB — SEDIMENTATION RATE: Erythrocyte Sed Rate: 17 mm/hr (ref 0–30)

## 2014-02-09 ENCOUNTER — Ambulatory Visit: Payer: Self-pay | Admitting: Pain Medicine

## 2014-02-22 ENCOUNTER — Ambulatory Visit: Payer: Self-pay | Admitting: Pain Medicine

## 2014-03-09 ENCOUNTER — Ambulatory Visit: Payer: Self-pay | Admitting: Pain Medicine

## 2014-03-15 ENCOUNTER — Ambulatory Visit: Payer: Self-pay | Admitting: Pain Medicine

## 2014-04-08 ENCOUNTER — Other Ambulatory Visit: Payer: Self-pay | Admitting: Pain Medicine

## 2014-04-08 ENCOUNTER — Ambulatory Visit: Payer: Self-pay | Admitting: Pain Medicine

## 2014-04-08 LAB — SEDIMENTATION RATE: Erythrocyte Sed Rate: 16 mm/hr (ref 0–30)

## 2014-04-19 ENCOUNTER — Ambulatory Visit: Payer: Self-pay | Admitting: Pain Medicine

## 2014-05-30 ENCOUNTER — Ambulatory Visit: Payer: Self-pay | Admitting: Pain Medicine

## 2014-06-16 ENCOUNTER — Encounter: Payer: Self-pay | Admitting: Pain Medicine

## 2014-07-21 ENCOUNTER — Other Ambulatory Visit (HOSPITAL_COMMUNITY): Payer: Self-pay | Admitting: Orthopedic Surgery

## 2014-07-21 DIAGNOSIS — M8580 Other specified disorders of bone density and structure, unspecified site: Secondary | ICD-10-CM

## 2014-08-18 ENCOUNTER — Ambulatory Visit (HOSPITAL_COMMUNITY)
Admission: RE | Admit: 2014-08-18 | Discharge: 2014-08-18 | Disposition: A | Discharge status: Home / Self Care | Payer: 59 | Source: Ambulatory Visit | Attending: Orthopedic Surgery | Admitting: Orthopedic Surgery

## 2014-08-18 DIAGNOSIS — M8580 Other specified disorders of bone density and structure, unspecified site: Secondary | ICD-10-CM | POA: Diagnosis not present

## 2014-08-18 DIAGNOSIS — Z13820 Encounter for screening for osteoporosis: Secondary | ICD-10-CM | POA: Insufficient documentation

## 2014-11-01 IMAGING — CR DG CHEST 2V
2 series · 2 of 2 positions shown · non-contrast
Comparison: 09/09/2012

CLINICAL DATA: Preop for laparoscopic cholecystectomy.
Hypertension.

EXAM:
CHEST  2 VIEW

[w chest pa]
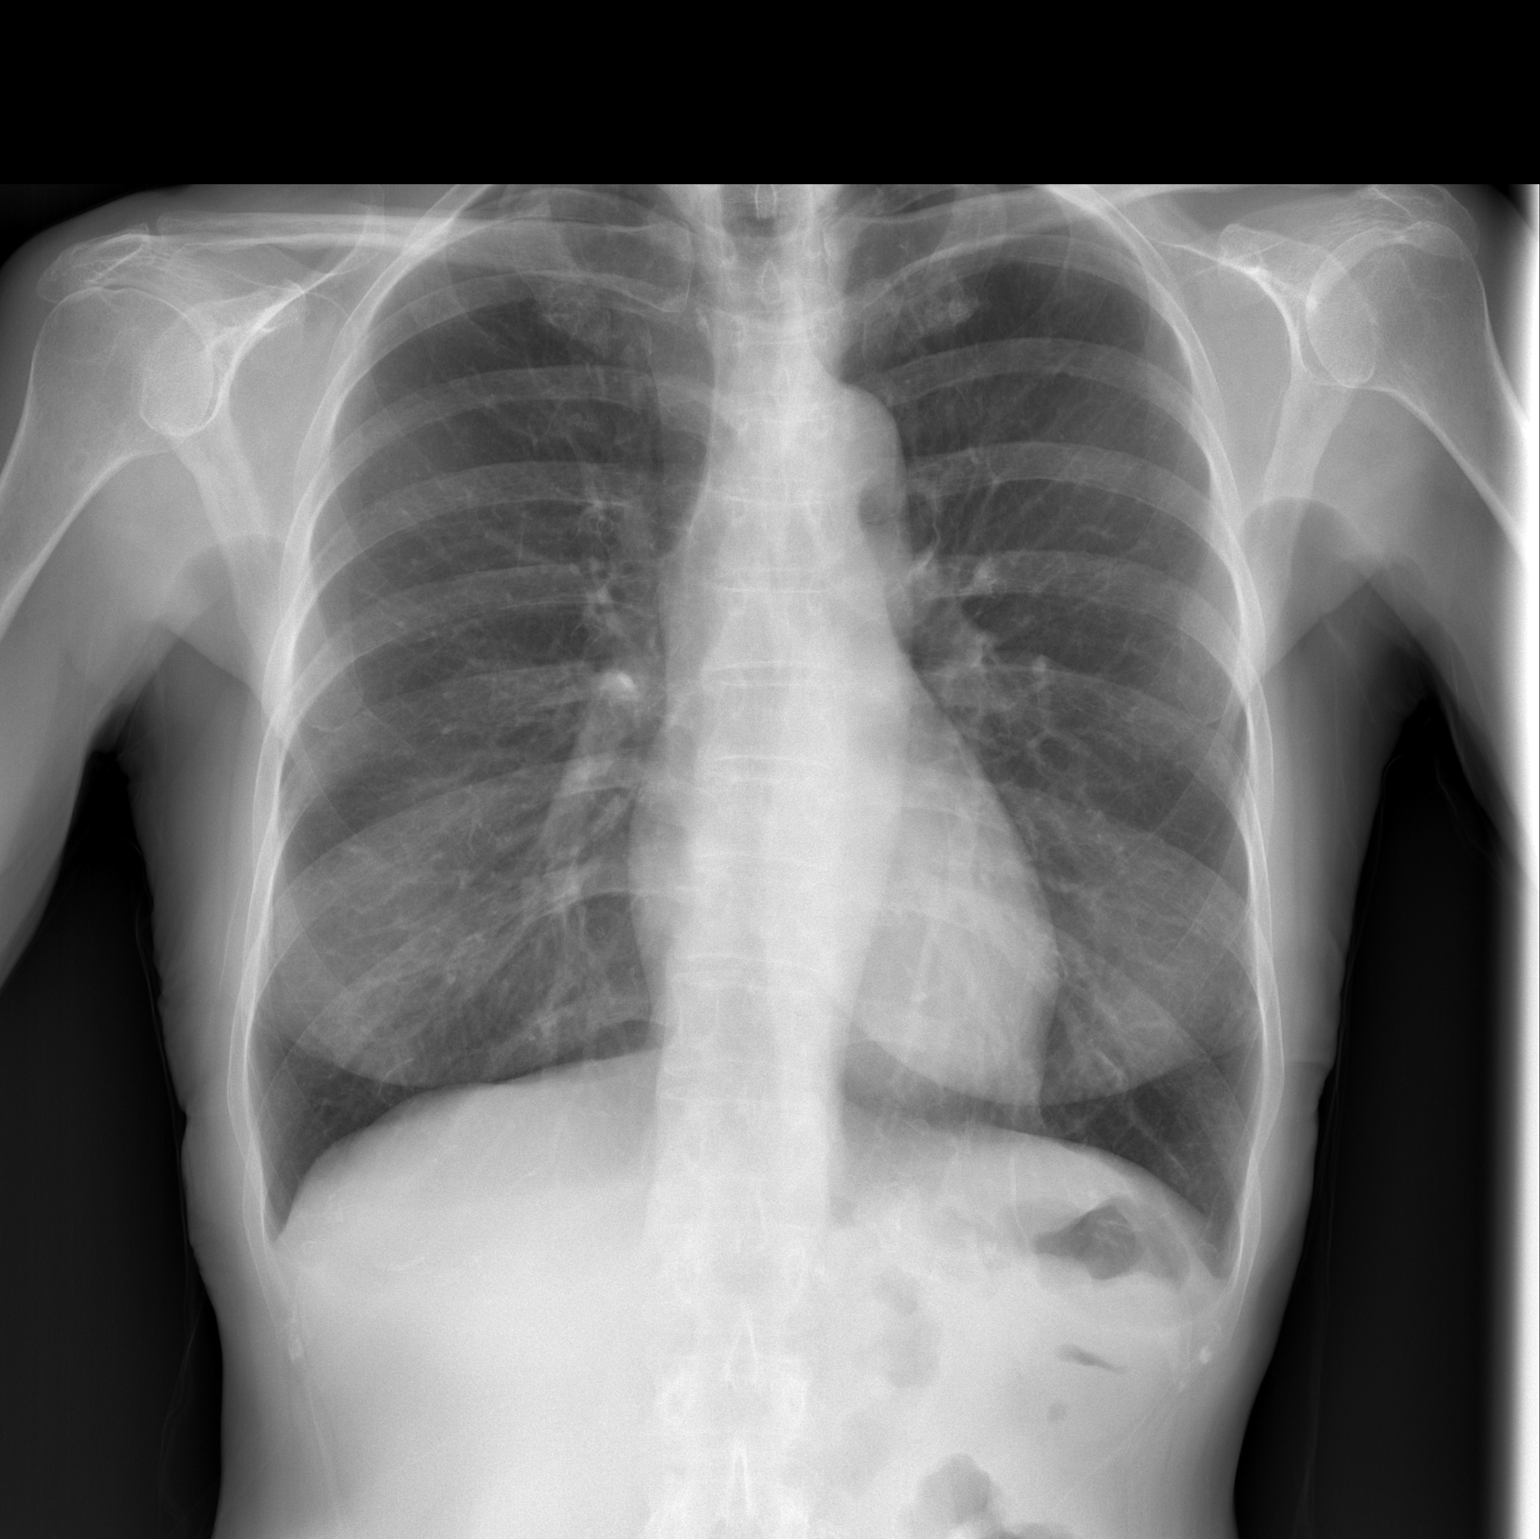

[w chest lat]
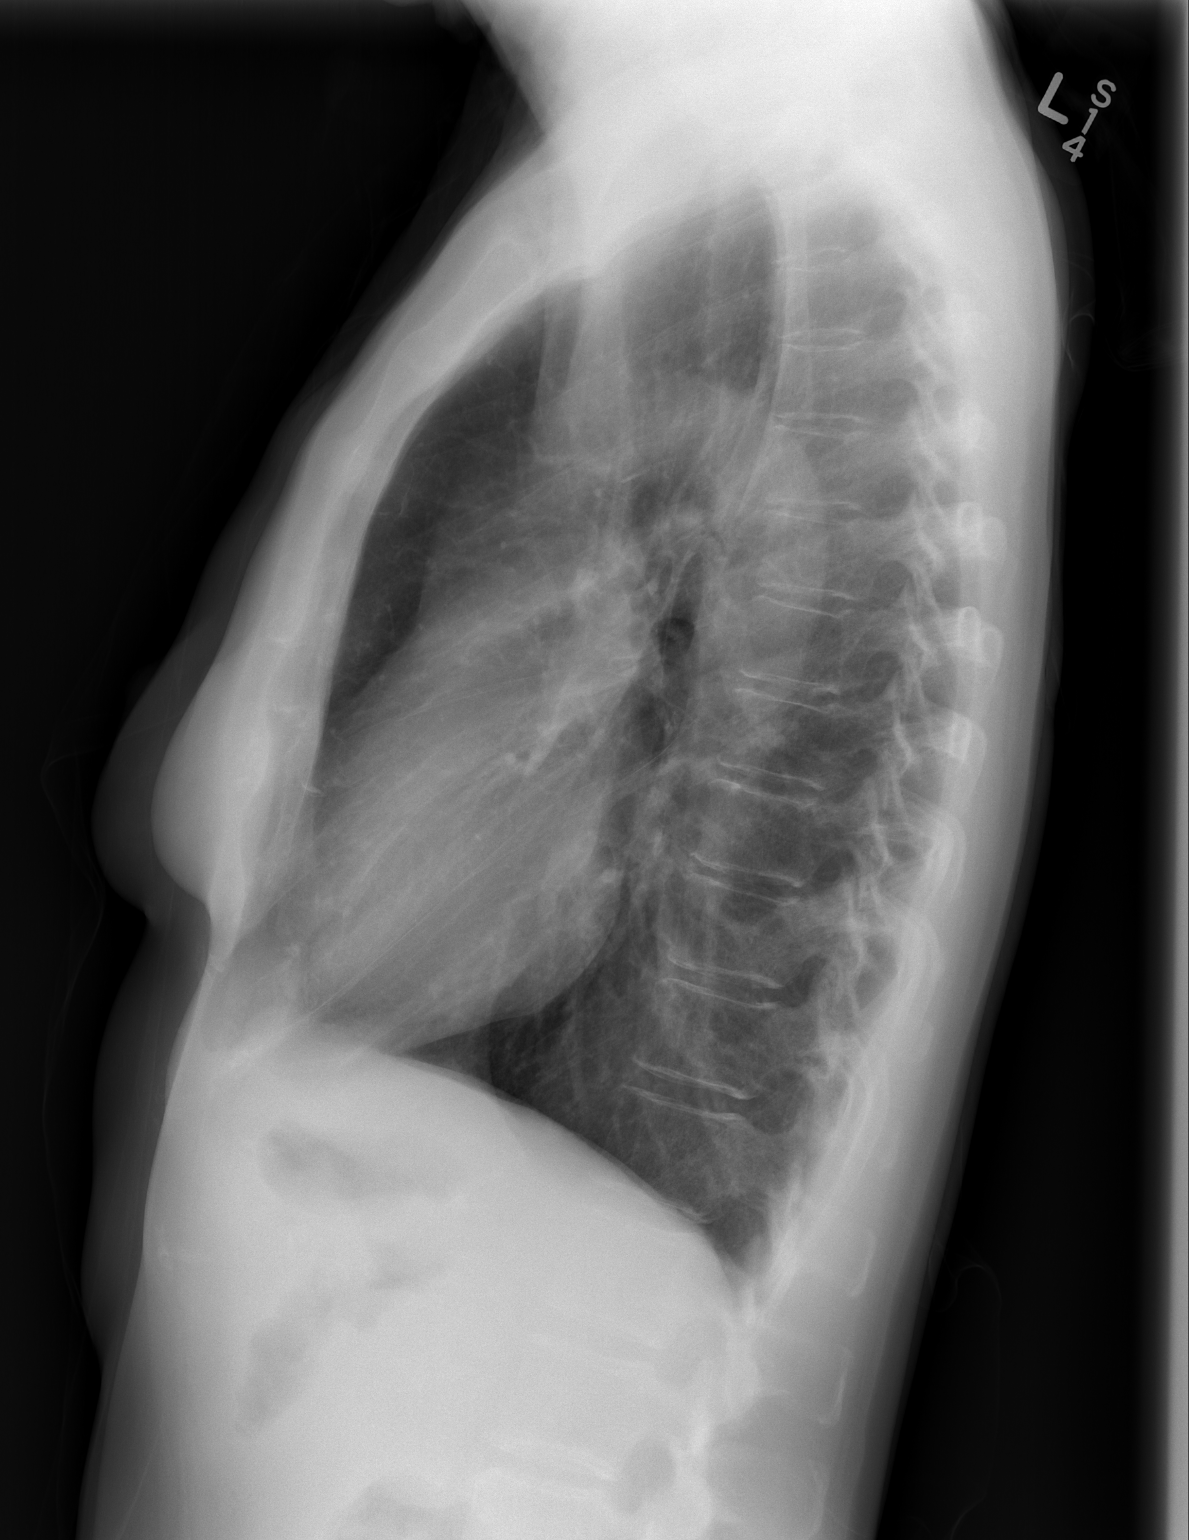

[2 of 2 positions shown; findings below may reference images not displayed]

FINDINGS: Midline trachea. Normal heart size and mediastinal contours. No
pleural effusion or pneumothorax. Clear lungs.
IMPRESSION: No acute cardiopulmonary disease.

## 2014-11-18 ENCOUNTER — Emergency Department (HOSPITAL_COMMUNITY): Payer: 59

## 2014-11-18 ENCOUNTER — Emergency Department (HOSPITAL_COMMUNITY)
Admission: EM | Admit: 2014-11-18 | Discharge: 2014-11-18 | Disposition: A | Discharge status: Home / Self Care | Payer: 59 | Attending: Emergency Medicine | Admitting: Emergency Medicine

## 2014-11-18 ENCOUNTER — Encounter (HOSPITAL_COMMUNITY): Payer: Self-pay

## 2014-11-18 DIAGNOSIS — N200 Calculus of kidney: Secondary | ICD-10-CM | POA: Insufficient documentation

## 2014-11-18 DIAGNOSIS — Z8701 Personal history of pneumonia (recurrent): Secondary | ICD-10-CM | POA: Diagnosis not present

## 2014-11-18 DIAGNOSIS — R109 Unspecified abdominal pain: Secondary | ICD-10-CM

## 2014-11-18 DIAGNOSIS — Z79899 Other long term (current) drug therapy: Secondary | ICD-10-CM | POA: Insufficient documentation

## 2014-11-18 DIAGNOSIS — I10 Essential (primary) hypertension: Secondary | ICD-10-CM | POA: Diagnosis not present

## 2014-11-18 DIAGNOSIS — K219 Gastro-esophageal reflux disease without esophagitis: Secondary | ICD-10-CM | POA: Insufficient documentation

## 2014-11-18 DIAGNOSIS — Z8739 Personal history of other diseases of the musculoskeletal system and connective tissue: Secondary | ICD-10-CM | POA: Insufficient documentation

## 2014-11-18 DIAGNOSIS — F329 Major depressive disorder, single episode, unspecified: Secondary | ICD-10-CM | POA: Diagnosis not present

## 2014-11-18 DIAGNOSIS — F419 Anxiety disorder, unspecified: Secondary | ICD-10-CM | POA: Insufficient documentation

## 2014-11-18 LAB — BASIC METABOLIC PANEL
Anion gap: 8 (ref 5–15)
BUN: 18 mg/dL (ref 6–23)
CO2: 26 mmol/L (ref 19–32)
Calcium: 8.7 mg/dL (ref 8.4–10.5)
Chloride: 101 mmol/L (ref 96–112)
Creatinine, Ser: 1.04 mg/dL (ref 0.50–1.10)
GFR calc Af Amer: 65 mL/min — ABNORMAL LOW (ref >90)
GFR calc non Af Amer: 56 mL/min — ABNORMAL LOW (ref >90)
Glucose, Bld: 89 mg/dL (ref 70–99)
Potassium: 4.3 mmol/L (ref 3.5–5.1)
Sodium: 135 mmol/L (ref 135–145)

## 2014-11-18 LAB — CBC WITH DIFFERENTIAL/PLATELET
Basophils Absolute: 0 10*3/uL (ref 0.0–0.1)
Basophils Relative: 0 % (ref 0–1)
Eosinophils Absolute: 0.1 10*3/uL (ref 0.0–0.7)
Eosinophils Relative: 2 % (ref 0–5)
HCT: 34.1 % — ABNORMAL LOW (ref 36.0–46.0)
Hemoglobin: 11.1 g/dL — ABNORMAL LOW (ref 12.0–15.0)
Lymphocytes Relative: 30 % (ref 12–46)
Lymphs Abs: 1.9 10*3/uL (ref 0.7–4.0)
MCH: 28.1 pg (ref 26.0–34.0)
MCHC: 32.6 g/dL (ref 30.0–36.0)
MCV: 86.3 fL (ref 78.0–100.0)
Monocytes Absolute: 0.8 10*3/uL (ref 0.1–1.0)
Monocytes Relative: 12 % (ref 3–12)
Neutro Abs: 3.4 10*3/uL (ref 1.7–7.7)
Neutrophils Relative %: 56 % (ref 43–77)
Platelets: 238 10*3/uL (ref 150–400)
RBC: 3.95 MIL/uL (ref 3.87–5.11)
RDW: 14.2 % (ref 11.5–15.5)
WBC: 6.1 10*3/uL (ref 4.0–10.5)

## 2014-11-18 LAB — URINALYSIS, ROUTINE W REFLEX MICROSCOPIC
Bilirubin Urine: NEGATIVE
Glucose, UA: NEGATIVE mg/dL
Ketones, ur: NEGATIVE mg/dL
Nitrite: NEGATIVE
Protein, ur: NEGATIVE mg/dL
Specific Gravity, Urine: 1.009 (ref 1.005–1.030)
Urobilinogen, UA: 0.2 mg/dL (ref 0.0–1.0)
pH: 5.5 (ref 5.0–8.0)

## 2014-11-18 LAB — URINE MICROSCOPIC-ADD ON

## 2014-11-18 MED ORDER — OXYCODONE-ACETAMINOPHEN 5-325 MG PO TABS: 2.0000 | ORAL_TABLET | Freq: Four times a day (QID) | ORAL | Status: DC | PRN

## 2014-11-18 MED ORDER — HYDROMORPHONE HCL 1 MG/ML IJ SOLN: 1.0000 mg | Freq: Once | INTRAMUSCULAR | Status: DC

## 2014-11-18 MED ORDER — HYDROMORPHONE HCL 1 MG/ML IJ SOLN
1.0000 mg | Freq: Once | INTRAMUSCULAR | Status: AC
  Administered 2014-11-18: 1 mg via INTRAVENOUS
  Filled 2014-11-18: qty 1

## 2014-11-18 MED ORDER — HYDROMORPHONE HCL 1 MG/ML IJ SOLN
0.5000 mg | Freq: Once | INTRAMUSCULAR | Status: AC
  Administered 2014-11-18: 0.5 mg via INTRAVENOUS
  Filled 2014-11-18: qty 1

## 2014-11-18 MED ORDER — ONDANSETRON HCL 4 MG/2ML IJ SOLN
4.0000 mg | Freq: Once | INTRAMUSCULAR | Status: AC
  Administered 2014-11-18: 4 mg via INTRAVENOUS
  Filled 2014-11-18: qty 2

## 2014-11-18 NOTE — ED Notes (Signed)
Nurse starting IV 

## 2014-11-18 NOTE — Discharge Instructions (Signed)

## 2014-11-18 NOTE — ED Notes (Signed)
Monday - blood in urine.  No pain.  Today, distinct kidney stone pain.  Hx of stones.  Went to MD today.  Xray ?? If positive for stone.  Sent here and told she would need CT.

## 2014-11-18 NOTE — ED Provider Notes (Signed)
CSN: [id number]     Arrival date & time 11/18/14  1633 History   First MD Initiated Contact with Patient 11/18/14 1642     Chief Complaint  Patient presents with  . Flank Pain     (Consider location/radiation/quality/duration/timing/severity/associated sxs/prior Treatment) HPI Comments: Patient with history of kidney stones presents to the emergency department with chief complaint of flank pain. Patient states pain started this morning. States that it feels like kidney stone. She reports moderate hematuria and dysuria. She states the pain is moderate to severe. Nothing makes it better or worse.  She has not tried taking anything other than her regular medications for her pain. She states that she takes Nucynta 200 mg extended release and 50 mg for breakthrough pain. She is managed by chronic pain management.  The history is provided by the patient. No language interpreter was used.    Past Medical History  Diagnosis Date  . Fatigue   . Nausea & vomiting   . IBS (irritable bowel syndrome)   . Abdominal pain   . Hypertension   . Depression     MOSTLY IN THE FALL OF THE YEAR  . Pneumonia     WHEN PT IN 6 TH GRADE  . Headache(784.0)     HX OF MIGRAINES  . Arthritis     OA LOWER SPINE  . SBO (small bowel obstruction) 2013  . Hiatal hernia 09/2009  . 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 Gastritis     Distal gastritis  . Anxiety   . GERD (gastroesophageal reflux disease)     "told that she does"  . History of kidney stones    Past Surgical History  Procedure Laterality Date  . Gastric bypass  11/13/2009  . Exploratory laparotomy  2012  . Tmj- left--about 10 yrs ago--left side--pt now has some pain left jaw Left     still has pain, scar tissue has been removed  . 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  Hernia repair  2011    hiatal  . Abdominal hysterectomy  1988    partial  . Cholecystectomy N/A 05/18/2013    Procedure: LAPAROSCOPIC CHOLECYSTECTOMY WITH INTRAOPERATIVE CHOLANGIOGRAM/laparoscopy/ ;  Surgeon: Pedro Earls, MD;  Location: [facility];  Service: General;  Laterality: N/A;   History reviewed. No pertinent family history. History  Substance Use Topics  . Smoking status: Never Smoker   . Smokeless tobacco: Never Used  . Alcohol Use: No   OB History    No data available     Review of Systems  Constitutional: Negative for fever and chills.  Respiratory: Negative for shortness of breath.   Cardiovascular: Negative for chest pain.  Gastrointestinal: Negative for nausea, vomiting, diarrhea and constipation.  Genitourinary: Positive for dysuria, hematuria and flank pain.  All other systems reviewed and are negative.     Allergies  Iohexol; Ace inhibitors; and Losartan potassium  Home Medications   Prior to Admission medications   Medication Sig Start Date End Date Taking? Authorizing Provider  benzonatate (TESSALON) 200 MG capsule Take 1 capsule by mouth 3 (three) times daily as needed. 11/03/14  Yes Historical Provider, MD  Cyanocobalamin (VITAMIN B-12) 500 MCG SUBL Place 500 mcg under the tongue daily.   Yes Historical Provider, MD  gabapentin (NEURONTIN) 300  MG capsule Take 2 capsules by mouth 2 (two) times daily. 08/20/14  Yes Historical Provider, MD  MELATONIN PO Take 1 tablet by mouth at bedtime as needed (sleep).   Yes Historical Provider, MD  NUCYNTA 50 MG TABS tablet Take 50 mg by mouth every 8 (eight) hours. 10/30/14  Yes Historical Provider, MD  NUCYNTA ER 200 MG TB12 Take 1 tablet by mouth 2 (two) times daily. 10/30/14  Yes Historical Provider, MD  omeprazole (PRILOSEC) 20 MG capsule Take 20 mg by mouth 2 (two) times daily.   Yes Historical Provider, MD  potassium gluconate 595 MG TABS tablet Take 595 mg by mouth daily as needed (low potassium).   Yes Historical  Provider, MD  PRENATAL VIT-FE FUM-FA-OMEGA PO Take 1 tablet by mouth daily.   Yes Historical Provider, MD  Probiotic Product (ALIGN) 4 MG CAPS Take 1 capsule by mouth at bedtime. Per Pt.   Yes Historical Provider, MD  UNABLE TO FIND Take 1 tablet by mouth daily as needed (nausea). Med Name: Nature's Way Pepogest (Peppermint oil)   Yes Historical Provider, MD  valsartan (DIOVAN) 40 MG tablet Take 40 mg by mouth every morning.   Yes Historical Provider, MD  venlafaxine XR (EFFEXOR-XR) 75 MG 24 hr capsule Take 225 mg by mouth every morning.   Yes Historical Provider, MD  oxyCODONE-acetaminophen (PERCOCET) 5-325 MG per tablet Take 1-2 tablets by mouth every 6 (six) hours as needed. 10/26/13   Orlie Dakin, MD  oxyCODONE-acetaminophen (ROXICET) 5-325 MG per tablet Take 1 tablet by mouth every 4 (four) hours as needed for severe pain. 09/30/13   Johnathan Hausen, MD  sucralfate (CARAFATE) 1 GM/10ML suspension Take 10 mLs (1 g total) by mouth 4 (four) times daily. 05/05/13   Johnathan Hausen, MD   BP 117/54 mmHg  Pulse 89  Temp(Src) 98.7 F (37.1 C) (Oral)  Resp 18  SpO2 98% Physical Exam  Constitutional: She is oriented to person, place, and time. She appears well-developed and well-nourished.  HENT:  Head: Normocephalic and atraumatic.  Eyes: Conjunctivae and EOM are normal. Pupils are equal, round, and reactive to light.  Neck: Normal range of motion. Neck supple.  Cardiovascular: Normal rate and regular rhythm.  Exam reveals no gallop and no friction rub.   No murmur heard. Pulmonary/Chest: Effort normal and breath sounds normal. No respiratory distress. She has no wheezes. She has no rales. She exhibits no tenderness.  Abdominal: Soft. Bowel sounds are normal. She exhibits no distension and no mass. There is no tenderness. There is no rebound and no guarding.  Musculoskeletal: Normal range of motion. She exhibits no edema or tenderness.  Neurological: She is alert and oriented to person, place,  and time.  Skin: Skin is warm and dry.  Psychiatric: She has a normal mood and affect. Her behavior is normal. Judgment and thought content normal.  Nursing note and vitals reviewed.   ED Course  Procedures (including critical care time) Labs Review Labs Reviewed  URINALYSIS, ROUTINE W REFLEX MICROSCOPIC - Abnormal; Notable for the following:    APPearance TURBID (*)    Hgb urine dipstick LARGE (*)    Leukocytes, UA SMALL (*)    All other components within normal limits  CBC WITH DIFFERENTIAL/PLATELET - Abnormal; Notable for the following:    Hemoglobin 11.1 (*)    HCT 34.1 (*)    All other components within normal limits  BASIC METABOLIC PANEL - Abnormal; Notable for the following:    GFR calc non Af  Amer 56 (*)    GFR calc Af Amer 65 (*)    All other components within normal limits  URINE MICROSCOPIC-ADD ON - Abnormal; Notable for the following:    Bacteria, UA FEW (*)    All other components within normal limits    Imaging Review Ct Abdomen Pelvis Wo Contrast  11/18/2014   CLINICAL DATA:  Left flank pain starting today, history of stone  EXAM: CT ABDOMEN AND PELVIS WITHOUT CONTRAST  TECHNIQUE: Multidetector CT imaging of the abdomen and pelvis was performed following the standard protocol without IV contrast.  COMPARISON:  07/13/2013  FINDINGS: Lung bases are unremarkable.  Sagittal images of the spine shows mild degenerative changes lumbar spine. Again noted status post gastric bypass surgery.  The patient is status postcholecystectomy. Unenhanced liver shows no biliary ductal dilatation. A cyst in right hepatic lobe posteriorly is stable in size from prior exam.  Unenhanced pancreas, spleen and adrenal glands are unremarkable. At least 2 nonobstructive calcified calculi are noted within left kidney the largest in midpole measures 3 mm. There is mild left hydronephrosis and very mild left hydroureter. No proximal or mid ureteral calculi are noted bilaterally.  Study is limited due to  multiple calcified pelvic phleboliths. The patient is status post hysterectomy. In axial image 62 there is a calcification in left pelvis new from prior exam measures about 2.7 mm. This is suspicious for a distal left ureteral calculus about 1.5 cm from left UVJ. Clinical correlation is necessary.  Moderate stool noted throughout the colon. There is no pericecal inflammation. Normal appendix partially visualized. No calcified calculi are noted within urinary bladder. No significant distal ureteral distension.  IMPRESSION: 1. There is mild left hydronephrosis and minimal left hydroureter. At least 2 nonobstructive left renal calculi are noted the largest measures 2.7 mm. 2. In axial image 62 there is a calcification in left pelvis new from prior exam measures about 2.7 mm. This is suspicious for a distal left ureteral calculus about 1.5 cm from left UVJ. Clinical correlation is necessary. 3. Status post hysterectomy. 4. Status postcholecystectomy.  Status post gastric bypass surgery.   Electronically Signed   By: Lahoma Crocker M.D.   On: 11/18/2014 18:32     EKG Interpretation None      MDM   Final diagnoses:  Flank pain  Kidney stone    Patient with flank pain and history of kidney stones. Will check labs and CT.  Abdomen soft and nontender, patient is well-appearing.  CT scan remarkable for several small kidney stones. Will recommend urology follow-up. Urinalysis is unremarkable for infection, but will order urine culture. We'll treat pain with Percocet. Discharged home with urology follow-up. Patient understands agrees the plan. She is stable and ready for discharge.   Montine Circle, PA-C 11/18/14 0017  Charlesetta Shanks, MD 11/18/14 2110

## 2015-01-18 ENCOUNTER — Other Ambulatory Visit (HOSPITAL_COMMUNITY): Payer: Self-pay | Admitting: Orthopedic Surgery

## 2015-01-18 ENCOUNTER — Ambulatory Visit (HOSPITAL_COMMUNITY)
Admission: RE | Admit: 2015-01-18 | Discharge: 2015-01-18 | Disposition: A | Discharge status: Home / Self Care | Payer: 59 | Source: Ambulatory Visit | Attending: Orthopedic Surgery | Admitting: Orthopedic Surgery

## 2015-01-18 DIAGNOSIS — R609 Edema, unspecified: Secondary | ICD-10-CM | POA: Diagnosis not present

## 2015-01-18 DIAGNOSIS — R52 Pain, unspecified: Secondary | ICD-10-CM

## 2015-01-18 DIAGNOSIS — R600 Localized edema: Secondary | ICD-10-CM | POA: Diagnosis not present

## 2015-01-18 NOTE — Progress Notes (Signed)
VASCULAR LAB PRELIMINARY  PRELIMINARY  PRELIMINARY  PRELIMINARY  Bilateral lower extremity venous duplex completed.    Preliminary report:  Bilateral:  No evidence of DVTor superficial thrombosis. No evidence of right Baker's cyst. There is evidence of a left ruptures Baker's cyst.   Levar Fayson, RVS 01/18/2015, 4:40 PM

## 2015-03-07 ENCOUNTER — Other Ambulatory Visit: Payer: Self-pay

## 2015-03-07 DIAGNOSIS — I83893 Varicose veins of bilateral lower extremities with other complications: Secondary | ICD-10-CM

## 2015-05-03 ENCOUNTER — Encounter: Payer: Self-pay | Admitting: Vascular Surgery

## 2015-05-08 ENCOUNTER — Encounter: Payer: Self-pay | Admitting: Vascular Surgery

## 2015-05-08 ENCOUNTER — Ambulatory Visit (INDEPENDENT_AMBULATORY_CARE_PROVIDER_SITE_OTHER): Payer: 59 | Admitting: Vascular Surgery

## 2015-05-08 VITALS — BP 128/71 | HR 80 | Temp 97.5°F | Resp 16 | Ht 66.0 in | Wt 149.0 lb

## 2015-05-08 DIAGNOSIS — I83891 Varicose veins of right lower extremities with other complications: Secondary | ICD-10-CM

## 2015-05-08 NOTE — Addendum Note (Signed)
Addended by: Dorthula Rue L on: 05/08/2015 02:03 PM   Modules accepted: Orders

## 2015-05-08 NOTE — Progress Notes (Signed)
Subjective:     Patient ID: Kari Gibbs, female   DOB: [birth year], 64 y.o.   MRN: [id number]  HPI this 64 year old female was referred by Dr. Eddie Dibbles for evaluation of swelling and discomfort in the right lower extremity. She states that she has had increasing edema over the last several months. She has a long history of bulging varicose veins in the right calf which have worsened. She has tried short-leg elastic compression stockings with no improvement. She does not elevate her legs during the day. She has no history of DVT, thrombophlebitis, stasis ulcers, or bleeding. Her symptoms worsen as the day progresses and her edema seems to be getting worse. It is affecting her daily living at the present time.  Past Medical History  Diagnosis Date  . Fatigue   . Nausea & vomiting   . IBS (irritable bowel syndrome)   . Abdominal pain   . Hypertension   . Depression     MOSTLY IN THE FALL OF THE YEAR  . Pneumonia     WHEN PT IN 6 TH GRADE  . Headache(784.0)     HX OF MIGRAINES  . Arthritis     OA LOWER SPINE  . SBO (small bowel obstruction) 2013  . Hiatal hernia 09/2009  . 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 Gastritis     Distal gastritis  . Anxiety   . GERD (gastroesophageal reflux disease)     "told that she does"  . History of kidney stones   . Varicose veins     Social History  Substance Use Topics  . Smoking status: Never Smoker   . Smokeless tobacco: Never Used  . Alcohol Use: No    No family history on file.  Allergies  Allergen Reactions  . Iohexol      Code: HIVES, Desc: Pt has had a prior IVP dye reaction,(no scans in health system).Symptoms were hives,and airway obstruction!, Onset Date: [id number]   . Ace Inhibitors     REACTION: cyclic cough  . Losartan Potassium Other (See Comments)    Raises blood pressure; fatigue     Current outpatient prescriptions:  .  gabapentin (NEURONTIN) 300 MG capsule, Take 2 capsules by mouth 2  (two) times daily., Disp: , Rfl: 2 .  omeprazole (PRILOSEC) 20 MG capsule, Take 20 mg by mouth 2 (two) times daily., Disp: , Rfl:  .  potassium gluconate 595 MG TABS tablet, Take 595 mg by mouth daily as needed (low potassium)., Disp: , Rfl:  .  PRENATAL VIT-FE FUM-FA-OMEGA PO, Take 1 tablet by mouth daily., Disp: , Rfl:  .  Probiotic Product (ALIGN) 4 MG CAPS, Take 1 capsule by mouth at bedtime. Per Pt., Disp: , Rfl:  .  UNABLE TO FIND, Take 1 tablet by mouth daily as needed (nausea). Med Name: [place] Pepogest (Peppermint oil), Disp: , Rfl:  .  valsartan (DIOVAN) 40 MG tablet, Take 40 mg by mouth every morning., Disp: , Rfl:  .  venlafaxine XR (EFFEXOR-XR) 75 MG 24 hr capsule, Take 225 mg by mouth every morning., Disp: , Rfl:  .  benzonatate (TESSALON) 200 MG capsule, Take 1 capsule by mouth 3 (three) times daily as needed., Disp: , Rfl: 1 .  Cyanocobalamin (VITAMIN B-12) 500 MCG SUBL, Place 500 mcg under the tongue daily., Disp: , Rfl:  .  MELATONIN PO, Take 1 tablet by mouth at bedtime as needed (sleep)., Disp: , Rfl:  .  NUCYNTA  50 MG TABS tablet, Take 50 mg by mouth every 8 (eight) hours., Disp: , Rfl: 0 .  NUCYNTA ER 200 MG TB12, Take 1 tablet by mouth 2 (two) times daily., Disp: , Rfl: 0 .  oxyCODONE-acetaminophen (PERCOCET/ROXICET) 5-325 MG per tablet, Take 2 tablets by mouth every 6 (six) hours as needed for severe pain. (Patient not taking: Reported on 05/08/2015), Disp: 15 tablet, Rfl: 0 .  sucralfate (CARAFATE) 1 GM/10ML suspension, Take 10 mLs (1 g total) by mouth 4 (four) times daily. (Patient not taking: Reported on 05/08/2015), Disp: 420 mL, Rfl: 0  Filed Vitals:   05/08/15 1007  BP: 128/71  Pulse: 80  Temp: 97.5 F (36.4 C)  Resp: 16  Height: 5\' 6"  (1.676 m)  Weight: 149 lb (67.586 kg)  SpO2: 100%    Body mass index is 24.06 kg/(m^2).           Review of Systems has history of gastric bypass lost 170 pounds since 2011. Has history of irritable bowel  syndrome . Denies chest pain, dyspnea on exertion, PND, orthopnea, hemoptysis.     Objective:   Physical Exam BP 128/71 mmHg  Pulse 80  Temp(Src) 97.5 F (36.4 C)  Resp 16  Ht 5\' 6"  (1.676 m)  Wt 149 lb (67.586 kg)  BMI 24.06 kg/m2  SpO2 100%  Gen.-alert and oriented x3 in no apparent distress HEENT normal for age Lungs no rhonchi or wheezing Cardiovascular regular rhythm no murmurs carotid pulses 3+ palpable no bruits audible Abdomen soft nontender no palpable masses Musculoskeletal free of  major deformities Skin clear -no rashes Neurologic normal Lower extremities 3+ femoral and dorsalis pedis pulses palpable bilaterally with no edema on the left 1+ edema on the right Right leg with bulging varicosities in the medial calf and posterior calf and the great saphenous vein distribution with scattered reticular and spider veins in the distal thigh medial and lateral calf down to the medial malleolus. No active ulcer noted.  Today I reviewed the recent venous ultrasound study performed at [facility] which revealed no evidence of DVT in the superficial or deep systems. No reflux study has been done.  I did perform a bedside ultrasound sono site exam independently and it appears that the patient has reflux in the right great saphenous vein supplying these painful varicosities.       Assessment:     Painful varicosities right leg with progressive edema causing discomfort. This seems to be due to gross reflux in the right great saphenous vein on my bedside ultrasound exam.    Plan:         #1 long leg elastic compression stockings 20-30 mm gradient #2 elevate legs as much as possible #3 ibuprofen daily on a regular basis for pain #4 return in 3 months-at that time we will perform formal reflux vein study of right lower extremity. If patient has had no improvement with medical management and it is indicated we would then recommend laser ablation right great saphenous vein plus  multiple stab phlebectomy +2 courses of sclerotherapy. Patient return in 3 months for formal recommendations

## 2015-06-13 ENCOUNTER — Encounter (HOSPITAL_COMMUNITY): Payer: 59

## 2015-06-13 ENCOUNTER — Encounter: Payer: 59 | Admitting: Vascular Surgery

## 2015-06-28 ENCOUNTER — Ambulatory Visit: Payer: 59 | Attending: Pain Medicine | Admitting: Pain Medicine

## 2015-06-28 ENCOUNTER — Ambulatory Visit: Payer: Self-pay | Admitting: Pain Medicine

## 2015-06-28 ENCOUNTER — Other Ambulatory Visit: Payer: Self-pay | Admitting: Pain Medicine

## 2015-06-28 ENCOUNTER — Encounter: Payer: Self-pay | Admitting: Pain Medicine

## 2015-06-28 VITALS — BP 136/54 | HR 79 | Temp 98.5°F | Resp 16 | Ht 66.0 in | Wt 142.0 lb

## 2015-06-28 DIAGNOSIS — F329 Major depressive disorder, single episode, unspecified: Secondary | ICD-10-CM | POA: Diagnosis not present

## 2015-06-28 DIAGNOSIS — R1012 Left upper quadrant pain: Secondary | ICD-10-CM

## 2015-06-28 DIAGNOSIS — Z79891 Long term (current) use of opiate analgesic: Secondary | ICD-10-CM

## 2015-06-28 DIAGNOSIS — T402X5A Adverse effect of other opioids, initial encounter: Secondary | ICD-10-CM

## 2015-06-28 DIAGNOSIS — M1990 Unspecified osteoarthritis, unspecified site: Secondary | ICD-10-CM | POA: Insufficient documentation

## 2015-06-28 DIAGNOSIS — R079 Chest pain, unspecified: Secondary | ICD-10-CM | POA: Diagnosis present

## 2015-06-28 DIAGNOSIS — M5481 Occipital neuralgia: Secondary | ICD-10-CM

## 2015-06-28 DIAGNOSIS — M792 Neuralgia and neuritis, unspecified: Secondary | ICD-10-CM

## 2015-06-28 DIAGNOSIS — K219 Gastro-esophageal reflux disease without esophagitis: Secondary | ICD-10-CM | POA: Insufficient documentation

## 2015-06-28 DIAGNOSIS — D1809 Hemangioma of other sites: Secondary | ICD-10-CM

## 2015-06-28 DIAGNOSIS — G894 Chronic pain syndrome: Secondary | ICD-10-CM

## 2015-06-28 DIAGNOSIS — G8929 Other chronic pain: Secondary | ICD-10-CM | POA: Diagnosis not present

## 2015-06-28 DIAGNOSIS — Z79899 Other long term (current) drug therapy: Secondary | ICD-10-CM

## 2015-06-28 DIAGNOSIS — R1011 Right upper quadrant pain: Secondary | ICD-10-CM

## 2015-06-28 DIAGNOSIS — R109 Unspecified abdominal pain: Secondary | ICD-10-CM | POA: Diagnosis not present

## 2015-06-28 DIAGNOSIS — I10 Essential (primary) hypertension: Secondary | ICD-10-CM | POA: Insufficient documentation

## 2015-06-28 DIAGNOSIS — F1190 Opioid use, unspecified, uncomplicated: Secondary | ICD-10-CM

## 2015-06-28 DIAGNOSIS — M5414 Radiculopathy, thoracic region: Secondary | ICD-10-CM

## 2015-06-28 DIAGNOSIS — F1120 Opioid dependence, uncomplicated: Secondary | ICD-10-CM | POA: Insufficient documentation

## 2015-06-28 DIAGNOSIS — Z9884 Bariatric surgery status: Secondary | ICD-10-CM | POA: Insufficient documentation

## 2015-06-28 DIAGNOSIS — M1991 Primary osteoarthritis, unspecified site: Secondary | ICD-10-CM | POA: Insufficient documentation

## 2015-06-28 DIAGNOSIS — Z7189 Other specified counseling: Secondary | ICD-10-CM

## 2015-06-28 DIAGNOSIS — K5903 Drug induced constipation: Secondary | ICD-10-CM | POA: Diagnosis not present

## 2015-06-28 DIAGNOSIS — Z5181 Encounter for therapeutic drug level monitoring: Secondary | ICD-10-CM

## 2015-06-28 DIAGNOSIS — Z7689 Persons encountering health services in other specified circumstances: Secondary | ICD-10-CM | POA: Insufficient documentation

## 2015-06-28 DIAGNOSIS — M5410 Radiculopathy, site unspecified: Secondary | ICD-10-CM

## 2015-06-28 DIAGNOSIS — M7741 Metatarsalgia, right foot: Secondary | ICD-10-CM

## 2015-06-28 DIAGNOSIS — M158 Other polyosteoarthritis: Secondary | ICD-10-CM

## 2015-06-28 DIAGNOSIS — Z8781 Personal history of (healed) traumatic fracture: Secondary | ICD-10-CM

## 2015-06-28 HISTORY — DX: Personal history of (healed) traumatic fracture: Z87.81

## 2015-06-28 MED ORDER — LUBIPROSTONE 8 MCG PO CAPS: 8.0000 ug | ORAL_CAPSULE | Freq: Two times a day (BID) | ORAL | Status: DC

## 2015-06-28 MED ORDER — MORPHINE SULFATE ER 30 MG PO TBCR: 30.0000 mg | EXTENDED_RELEASE_TABLET | Freq: Two times a day (BID) | ORAL | Status: DC

## 2015-06-28 MED ORDER — GABAPENTIN 300 MG PO CAPS: 900.0000 mg | ORAL_CAPSULE | Freq: Four times a day (QID) | ORAL | Status: DC

## 2015-06-28 MED ORDER — BENEFIBER PO POWD: ORAL | Status: DC

## 2015-06-28 NOTE — Progress Notes (Signed)
Safety precautions to be maintained throughout the outpatient stay will include: orient to surroundings, keep bed in low position, maintain call bell within reach at all times, provide assistance with transfer out of bed and ambulation. Morphine pill count #21/60

## 2015-06-28 NOTE — Progress Notes (Signed)
Patient's Name: Kari Gibbs MRN: [id number] DOB: 1950-11-14 DOS: 06/28/2015  Primary Reason(s) for Visit: Encounter for Medication Management. CC: Chest Pain   HPI:   Kari Gibbs is a 64 y.o. year old, female patient, who returns today as an established patient. 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; S/P laparoscopic cholecystectomy Oct 2014; Fracture of rib of left side; Varicose veins of leg with complications; Chronic pain; Long term current use of opiate analgesic; Long term prescription opiate use; Opiate use; Encounter for therapeutic drug level monitoring; Opiate dependence ([place]); Encounter for chronic pain management; Chronic pain syndrome; Chronic left flank pain; Chronic thoracic radicular pain (Left-sided); Therapeutic opioid-induced constipation (OIC); Occipital neuralgia of left side; Neuropathic pain; Neurogenic pain; History of rib fracture (Left 4th and 8th rib); Vertebral body hemangioma (T11); Metatarsalgia of right foot; Chronic bilateral upper abdominal pain (L>R) (possible left-sided thoracic radiculopathy); and Osteoarthritis on her problem list.. Her primarily concern today is the Chest Pain     The patient returns to the clinic today referring that she is not having any problems with her MS Contin. She has been experiencing some pain in the left side of the posterior neck. Physical exam today confirms that were dealing with a left greater occipital neuralgia. She has also been experiencing some shooting pains down both arms. Today we will increase the dose on the gabapentin.  Today's Pain Score: 4  Reported level of pain is compatible with clinical observation Pain Type: Chronic pain Pain Location: Rib cage Pain Orientation: Left Pain Descriptors / Indicators: Burning, Constant, Throbbing Pain Frequency: Constant  Date of Last Visit: Date of Last Visit:  04/04/15 Service Provided on Last Visit: Service Provided on Last Visit: Med Refill  Pharmacotherapy Review:   Side-effects or Adverse reactions: None reported. Effectiveness: Described as relatively effective, allowing for increase in activities of daily living (ADL). Onset of action: Within expected pharmacological parameters. Duration of action: Within normal limits for medication. Peak effect: Timing and results are as within normal expected parameters. [place] PMP: Compliant with practice rules and regulations. UDS: Compliant with practice rules and regulations. Lab work: No new labs ordered by our practice. Treatment compliance: Compliant. Substance Use Disorder (SUD) Risk Level: Low Planned course of action: Continue therapy as is.  Allergies: Kari Gibbs is allergic to iohexol; ace inhibitors; and losartan potassium.  Meds: The patient has a current medication list which includes the following prescription(s): chlorpheniramine maleate, ergocalciferol, ferrous sulfate, gabapentin, magnesium, omeprazole, potassium gluconate, prenatal vit-fe fum-fa-omega, align, teriparatide (recombinant), UNABLE TO FIND, valsartan, venlafaxine xr, lubiprostone, morphine, morphine, morphine, and benefiber. Requested Prescriptions   Signed Prescriptions Disp Refills  . gabapentin (NEURONTIN) 300 MG capsule 360 capsule 2    Sig: Take 3 capsules (900 mg total) by mouth 4 (four) times daily. May go up to 3 capsules 4 times a day. Titrate up slowly.  Marland Kitchen morphine (MS CONTIN) 30 MG 12 hr tablet 60 tablet 0    Sig: Take 1 tablet (30 mg total) by mouth every 12 (twelve) hours.  Marland Kitchen morphine (MS CONTIN) 30 MG 12 hr tablet 60 tablet 0    Sig: Take 1 tablet (30 mg total) by mouth every 12 (twelve) hours.  Marland Kitchen morphine (MS CONTIN) 30 MG 12 hr tablet 60 tablet 0    Sig: Take 1 tablet (30 mg total) by mouth every 12 (twelve) hours.  Marland Kitchen lubiprostone (AMITIZA) 8 MCG capsule 60 capsule PRN  Sig: Take 1 capsule (8 mcg  total) by mouth 2 (two) times daily with a meal. Swallow the medication whole. Do not break or chew the medication.  . Wheat Dextrin (BENEFIBER) POWD 500 g PRN    Sig: Stir 2 teaspoons of Benefiber into 4-8 oz of any non-carbonated beverage or soft food (hot or cold) TID.    ROS: Constitutional: Afebrile, no chills, well hydrated and well nourished Gastrointestinal: negative Musculoskeletal:negative Neurological: negative Behavioral/Psych: negative  PFSH: Medical:  Kari Gibbs  has a past medical history of Fatigue; Nausea & vomiting; IBS (irritable bowel syndrome); Abdominal pain; Hypertension; Depression; Pneumonia; Headache(784.0); Arthritis; SBO (small bowel obstruction) ([place]) (2013); Hiatal hernia (09/2009); Diverticulum of stomach (09/2009); Gastritis; Anxiety; GERD (gastroesophageal reflux disease); History of kidney stones; Varicose veins; History of rib fracture (Left 4th and 8th rib) (06/28/2015); and OSTEOARTHRITIS (05/01/2010). Family: family history includes Alzheimer's disease in her father; Hypertension in her mother. 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 Tobacco:  reports that she has never smoked. She has never used smokeless tobacco. Alcohol:  reports that she does not drink alcohol. Drug:  reports that she does not use illicit drugs.  Physical Exam: Vitals:  Today's Vitals   06/28/15 1119 06/28/15 1122  BP: 136/54   Pulse: 79   Temp: 98.5 F (36.9 C)   Resp: 16   Height: 5\' 6"  (1.676 m)   Weight: 142 lb (64.411 kg)   SpO2: 100%   PainSc: 4  4   PainLoc: Rib Cage   Calculated BMI: Body mass index is 22.93 kg/(m^2). General appearance: alert, cooperative, appears stated age and no distress Eyes:  conjunctivae/corneas clear. PERRL, EOM's intact. Fundi benign. Lungs: No evidence respiratory distress, no audible rales or ronchi and no use of accessory muscles of respiration Neck: no adenopathy, no carotid bruit, no JVD, supple, symmetrical, trachea midline and thyroid not enlarged, symmetric, no tenderness/mass/nodules Back: symmetric, no curvature. ROM normal. No CVA tenderness. Extremities: extremities normal, atraumatic, no cyanosis or edema Pulses: 2+ and symmetric Skin: Skin color, texture, turgor normal. No rashes or lesions Neurologic: Grossly normal    Assessment: Encounter Diagnosis:  Primary Diagnosis: Chronic pain [G89.29]  Plan: Kari Gibbs was seen today for chest pain.  Diagnoses and all orders for this visit:  Chronic pain -     COMPLETE METABOLIC PANEL WITH GFR; Future -     C-reactive protein; Future -     Magnesium; Future -     Sedimentation rate; Future -     Vitamin D2,D3 Panel; Future -     morphine (MS CONTIN) 30 MG 12 hr tablet; Take 1 tablet (30 mg total) by mouth every 12 (twelve) hours. -     morphine (MS CONTIN) 30 MG 12 hr tablet; Take 1 tablet (30 mg total) by mouth every 12 (twelve) hours. -     morphine (MS CONTIN) 30 MG 12 hr tablet; Take 1 tablet (30 mg total) by mouth every 12 (twelve) hours.  Long term current use of opiate analgesic -     Drugs of abuse screen w/o alc, rtn urine-sln -     Drugs of abuse screen w/o alc, rtn urine-sln; Future  Long term prescription opiate use  Opiate use  Encounter for therapeutic drug level monitoring  Uncomplicated opioid dependence ([place])  Encounter for  chronic pain management  Chronic pain syndrome  Chronic left flank pain  Chronic thoracic radicular pain (Left-sided)  Therapeutic opioid-induced constipation (OIC) -     lubiprostone (AMITIZA) 8 MCG capsule; Take 1 capsule (8 mcg total) by mouth 2 (two) times daily with a meal. Swallow the medication whole. Do not break or chew the medication. -      Wheat Dextrin (BENEFIBER) POWD; Stir 2 teaspoons of Benefiber into 4-8 oz of any non-carbonated beverage or soft food (hot or cold) TID.  Occipital neuralgia of left side -     GREATER OCCIPITAL NERVE BLOCK; Standing  Neuropathic pain  Neurogenic pain -     gabapentin (NEURONTIN) 300 MG capsule; Take 3 capsules (900 mg total) by mouth 4 (four) times daily. May go up to 3 capsules 4 times a day. Titrate up slowly.  History of rib fracture (Left 4th and 8th rib)  Vertebral body hemangioma (T11)  Metatarsalgia of right foot  Chronic bilateral upper abdominal pain (L>R) (possible left-sided thoracic radiculopathy)  Other osteoarthritis involving multiple joints     Patient Instructions  Occipital Nerve Block Patient Information  Description: The occipital nerves originate in the cervical (neck) spinal cord and travel upward through muscle and tissue to supply sensation to the back of the head and top of the scalp.  In addition, the nerves control some of the muscles of the scalp.  Occipital neuralgia is an irritation of these nerves which can cause headaches, numbness of the scalp, and neck discomfort.     The occipital nerve block will interrupt nerve transmission through these nerves and can relieve pain and spasm.  The block consists of insertion of a small needle under the skin in the back of the head to deposit local anesthetic (numbing medicine) and/or steroids around the nerve.  The entire block usually lasts less than 5 minutes.  Conditions which may be treated by occipital blocks:   Muscular pain and spasm of the scalp  Nerve irritation, back of the head  Headaches  Upper neck pain  Preparation for the injection:  1. Do not eat any solid food or dairy products within 6 hours of your appointment. 2. You may drink clear liquids up to 2 hours before appointment.  Clear liquids include water, black coffee, juice or soda.  No milk or cream please. 3. You may take your  regular medication, including pain medications, with a sip of water before you appointment.  Diabetics should hold regular insulin (if taken separately) and take 1/2 normal NPH dose the morning of the procedure.  Carry some sugar containing items with you to your appointment. 4. A driver must accompany you and be prepared to drive you home after your procedure. 5. Bring all your current medications with you. 6. An IV may be inserted and sedation may be given at the discretion of the physician. 7. A blood pressure cuff, EKG, and other monitors will often be applied during the procedure.  Some patients may need to have extra oxygen administered for a short period. 8. You will be asked to provide medical information, including your allergies and medications, prior to the procedure.  We must know immediately if you are taking blood thinners (like Coumadin/Warfarin) or if you are allergic to IV iodine contrast (dye).  We must know if you could possible be pregnant.  9. Do not wear a high collared shirt or turtleneck.  Tie long hair up in the back if possible.  Possible side-effects:  Bleeding from needle site  Infection (rare, may require surgery)  Nerve injury (rare)  Hair on back of neck can be tinged with iodine scrub (this will wash out)  Light-headedness (temporary)  Pain at injection site (several days)  Decreased blood pressure (rare, temporary)  Seizure (very rare)  Call if you experience:   Hives or difficulty breathing ( go to the emergency room)  Inflammation or drainage at the injection site(s)  Please note:  Although the local anesthetic injected can often make your painful muscles or headache feel good for several hours after the injection, the pain may return.  It takes 3-7 days for steroids to work.  You may not notice any pain relief for at least one week.  If effective, we will often do a series of injections spaced 3-6 weeks apart to maximally decrease your  pain.  If you have any questions, please call [phone] [facility]    Medications discontinued today:  Medications Discontinued During This Encounter  Medication Reason  . Cyanocobalamin (VITAMIN B-12) 500 MCG SUBL Error  . MELATONIN PO Error  . NUCYNTA 50 MG TABS tablet Error  . NUCYNTA ER 200 MG TB12 Error  . oxyCODONE-acetaminophen (PERCOCET/ROXICET) 5-325 MG per tablet Error  . sucralfate (CARAFATE) 1 GM/10ML suspension Error  . morphine (MS CONTIN) 30 MG 12 hr tablet   . gabapentin (NEURONTIN) 300 MG capsule Reorder  . benzonatate (TESSALON) 200 MG capsule Error   Medications administered today:  Kari Gibbs had no medications administered during this visit.  Primary Care Physician: Cari Caraway, MD Location: [facility] Note by: Kathlen Brunswick. Dossie Arbour, M.D, DABA, DABAPM, DABPM, DABIPP, FIPP

## 2015-06-28 NOTE — Patient Instructions (Signed)
Occipital Nerve Block Patient Information  Description: The occipital nerves originate in the cervical (neck) spinal cord and travel upward through muscle and tissue to supply sensation to the back of the head and top of the scalp.  In addition, the nerves control some of the muscles of the scalp.  Occipital neuralgia is an irritation of these nerves which can cause headaches, numbness of the scalp, and neck discomfort.     The occipital nerve block will interrupt nerve transmission through these nerves and can relieve pain and spasm.  The block consists of insertion of a small needle under the skin in the back of the head to deposit local anesthetic (numbing medicine) and/or steroids around the nerve.  The entire block usually lasts less than 5 minutes.  Conditions which may be treated by occipital blocks:   Muscular pain and spasm of the scalp  Nerve irritation, back of the head  Headaches  Upper neck pain  Preparation for the injection:  1. Do not eat any solid food or dairy products within 6 hours of your appointment. 2. You may drink clear liquids up to 2 hours before appointment.  Clear liquids include water, black coffee, juice or soda.  No milk or cream please. 3. You may take your regular medication, including pain medications, with a sip of water before you appointment.  Diabetics should hold regular insulin (if taken separately) and take 1/2 normal NPH dose the morning of the procedure.  Carry some sugar containing items with you to your appointment. 4. A driver must accompany you and be prepared to drive you home after your procedure. 5. Bring all your current medications with you. 6. An IV may be inserted and sedation may be given at the discretion of the physician. 7. A blood pressure cuff, EKG, and other monitors will often be applied during the procedure.  Some patients may need to have extra oxygen administered for a short period. 8. You will be asked to provide medical  information, including your allergies and medications, prior to the procedure.  We must know immediately if you are taking blood thinners (like Coumadin/Warfarin) or if you are allergic to IV iodine contrast (dye).  We must know if you could possible be pregnant.  9. Do not wear a high collared shirt or turtleneck.  Tie long hair up in the back if possible.  Possible side-effects:   Bleeding from needle site  Infection (rare, may require surgery)  Nerve injury (rare)  Hair on back of neck can be tinged with iodine scrub (this will wash out)  Light-headedness (temporary)  Pain at injection site (several days)  Decreased blood pressure (rare, temporary)  Seizure (very rare)  Call if you experience:   Hives or difficulty breathing ( go to the emergency room)  Inflammation or drainage at the injection site(s)  Please note:  Although the local anesthetic injected can often make your painful muscles or headache feel good for several hours after the injection, the pain may return.  It takes 3-7 days for steroids to work.  You may not notice any pain relief for at least one week.  If effective, we will often do a series of injections spaced 3-6 weeks apart to maximally decrease your pain.  If you have any questions, please call [phone] [facility]  

## 2015-07-05 LAB — TOXASSURE SELECT 13 (MW), URINE: PDF: 0

## 2015-07-21 ENCOUNTER — Other Ambulatory Visit: Payer: Self-pay | Admitting: Pain Medicine

## 2015-08-08 ENCOUNTER — Encounter: Payer: Self-pay | Admitting: Vascular Surgery

## 2015-08-15 ENCOUNTER — Encounter: Payer: Self-pay | Admitting: Vascular Surgery

## 2015-08-15 ENCOUNTER — Encounter (HOSPITAL_COMMUNITY): Payer: 59

## 2015-08-15 ENCOUNTER — Ambulatory Visit (HOSPITAL_COMMUNITY)
Admission: RE | Admit: 2015-08-15 | Discharge: 2015-08-15 | Disposition: A | Discharge status: Home / Self Care | Payer: 59 | Source: Ambulatory Visit | Attending: Vascular Surgery | Admitting: Vascular Surgery

## 2015-08-15 ENCOUNTER — Ambulatory Visit (INDEPENDENT_AMBULATORY_CARE_PROVIDER_SITE_OTHER): Payer: 59 | Admitting: Vascular Surgery

## 2015-08-15 ENCOUNTER — Other Ambulatory Visit: Payer: Self-pay | Admitting: *Deleted

## 2015-08-15 ENCOUNTER — Ambulatory Visit: Payer: 59 | Admitting: Vascular Surgery

## 2015-08-15 VITALS — BP 129/72 | HR 72 | Temp 98.0°F | Resp 16 | Ht 66.0 in | Wt 150.0 lb

## 2015-08-15 DIAGNOSIS — I83891 Varicose veins of right lower extremities with other complications: Secondary | ICD-10-CM

## 2015-08-15 DIAGNOSIS — I83899 Varicose veins of unspecified lower extremities with other complications: Secondary | ICD-10-CM | POA: Insufficient documentation

## 2015-08-15 DIAGNOSIS — I83893 Varicose veins of bilateral lower extremities with other complications: Secondary | ICD-10-CM

## 2015-08-15 DIAGNOSIS — I10 Essential (primary) hypertension: Secondary | ICD-10-CM | POA: Diagnosis not present

## 2015-08-15 NOTE — Progress Notes (Signed)
Subjective:     Patient ID: Kari Gibbs, female   DOB: 26-Sep-1950, 65 y.o.   MRN: [id number]  HPI This 65 year old female returns for continued follow-up regarding her bilateral pain and swelling.  She has continued to have aching throbbing and burning discomfort in both legs right worse than left. She has progressive edema as the day wears on. She has been wearing long leg elastic compression stockings 20-30 millimeter gradient as well as elevation and ibuprofen with no improvement. This is affecting her daily living in the left leg continues to worsen in addition to the chronic right leg discomfort.  Past Medical History  Diagnosis Date  . Fatigue   . Nausea & vomiting   . IBS (irritable bowel syndrome)   . Abdominal pain   . Hypertension   . Depression     MOSTLY IN THE FALL OF THE YEAR  . Pneumonia     WHEN PT IN 6 TH GRADE  . Headache(784.0)     HX OF MIGRAINES  . Arthritis     OA LOWER SPINE  . SBO (small bowel obstruction) ([place]) 2013  . Hiatal hernia 09/2009  . 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 Gastritis     Distal gastritis  . Anxiety   . GERD (gastroesophageal reflux disease)     "told that she does"  . History of kidney stones   . Varicose veins   . History of rib fracture (Left 4th and 8th rib) 06/28/2015  . OSTEOARTHRITIS 05/01/2010    Qualifier: Diagnosis of  By: Megan Salon MD, John      Social History  Substance Use Topics  . Smoking status: Never Smoker   . Smokeless tobacco: Never Used  . Alcohol Use: No    Family History  Problem Relation Age of Onset  . Hypertension Mother   . Alzheimer's disease Father     Allergies  Allergen Reactions  . Iohexol      Code: HIVES, Desc: Pt has had a prior IVP dye reaction,(no scans in health system).Symptoms were hives,and airway obstruction!, Onset Date: [id number]   . Ace Inhibitors     REACTION: cyclic cough  . Losartan Potassium Other (See Comments)    Raises blood  pressure; fatigue     Current outpatient prescriptions:  .  CHLORPHENIRAMINE MALEATE PO, Take 4 mg by mouth every 6 (six) hours as needed., Disp: , Rfl:  .  ferrous sulfate 325 (65 FE) MG tablet, Take 325 mg by mouth daily with breakfast., Disp: , Rfl:  .  gabapentin (NEURONTIN) 300 MG capsule, Take 3 capsules (900 mg total) by mouth 4 (four) times daily. May go up to 3 capsules 4 times a day. Titrate up slowly., Disp: 360 capsule, Rfl: 2 .  lubiprostone (AMITIZA) 8 MCG capsule, Take 1 capsule (8 mcg total) by mouth 2 (two) times daily with a meal. Swallow the medication whole. Do not break or chew the medication., Disp: 60 capsule, Rfl: PRN .  Magnesium 500 MG TABS, Take 1 tablet by mouth 2 (two) times daily., Disp: , Rfl:  .  morphine (MS CONTIN) 30 MG 12 hr tablet, Take 1 tablet (30 mg total) by mouth every 12 (twelve) hours., Disp: 60 tablet, Rfl: 0 .  morphine (MS CONTIN) 30 MG 12 hr tablet, Take 1 tablet (30 mg total) by mouth every 12 (twelve) hours., Disp: 60 tablet, Rfl: 0 .  morphine (MS CONTIN) 30 MG 12 hr tablet, Take  1 tablet (30 mg total) by mouth every 12 (twelve) hours., Disp: 60 tablet, Rfl: 0 .  omeprazole (PRILOSEC) 20 MG capsule, Take 20 mg by mouth 2 (two) times daily., Disp: , Rfl:  .  potassium gluconate 595 MG TABS tablet, Take 595 mg by mouth daily as needed (low potassium)., Disp: , Rfl:  .  PRENATAL VIT-FE FUM-FA-OMEGA PO, Take 1 tablet by mouth daily., Disp: , Rfl:  .  Probiotic Product (ALIGN) 4 MG CAPS, Take 1 capsule by mouth at bedtime. Per Pt., Disp: , Rfl:  .  Teriparatide, Recombinant, (FORTEO) 600 MCG/2.4ML SOLN, Inject 20 mcg into the skin daily., Disp: , Rfl:  .  UNABLE TO FIND, Take 1 tablet by mouth daily as needed (nausea). Med Name: [facility] Pepogest (Peppermint oil), Disp: , Rfl:  .  valsartan (DIOVAN) 40 MG tablet, Take 40 mg by mouth every morning., Disp: , Rfl:  .  venlafaxine XR (EFFEXOR-XR) 75 MG 24 hr capsule, Take 225 mg by mouth every  morning., Disp: , Rfl:  .  Wheat Dextrin (BENEFIBER) POWD, Stir 2 teaspoons of Benefiber into 4-8 oz of any non-carbonated beverage or soft food (hot or cold) TID., Disp: 500 g, Rfl: PRN .  ergocalciferol (VITAMIN D2) 50000 UNITS capsule, Take 50,000 Units by mouth once a week. Reported on 08/15/2015, Disp: , Rfl:   Filed Vitals:   08/15/15 1200  BP: 129/72  Pulse: 72  Temp: 98 F (36.7 C)  Resp: 16  Height: 5\' 6"  (1.676 m)  Weight: 150 lb (68.04 kg)  SpO2: 98%    Body mass index is 24.22 kg/(m^2).           Review of Systems  Denies chest pain, dyspnea on exertion, PND, orthopnea, hemoptysis     Objective:   Physical Exam BP 129/72 mmHg  Pulse 72  Temp(Src) 98 F (36.7 C)  Resp 16  Ht 5\' 6"  (1.676 m)  Wt 150 lb (68.04 kg)  BMI 24.22 kg/m2  SpO2 98%   Gen. Well-developed well-nourished female in no apparent stress alert and oriented 3 Lungs no rhonchi or wheezing Right leg with bulging varicosities in the medial distal thigh and medial calf extending anteriorly and posteriorly with 2 mass of reticular and spider veins and 1+ edema. No active ulcer noted. Left leg has 1+ edema with 3+ dorsalis pedis pulse palpable. Reticular and spider veins along the anterior pretibial region with small varicosities in the posterior calf.    today I ordered a formal duplex exam of the right leg which revealed gross reflux throughout the right great saphenous system and no DVT with a large caliber right great saphenous vein   I performed a bedside venous duplex exam of the left leg-sono site which reveals gross reflux in one area of the great saphenous vein but not throughout. This was confirmed with formal venous duplex exam.    Assessment:      bilateral pain and swelling and painful varicosities due to gross reflux right great saphenous vein greater than left This is causing symptoms  Which are affecting patient's daily living and resistant to conservative measures including  bilateral long leg 20 mm 30 mm compression stockings which she has been wearing for the past 3 months as well as elevation ibuprofen     Plan:      patient needs #1 laser ablation right great saphenous vein plus greater than 20 stab phlebectomy of painful varicosities followed by 2 courses of sclerotherapy   We  will proceed with precertification to perform this in the near future to relieve her symptoms

## 2015-08-16 ENCOUNTER — Ambulatory Visit (HOSPITAL_COMMUNITY)
Admission: RE | Admit: 2015-08-16 | Discharge: 2015-08-16 | Disposition: A | Discharge status: Home / Self Care | Payer: 59 | Source: Ambulatory Visit | Attending: Vascular Surgery | Admitting: Vascular Surgery

## 2015-08-16 DIAGNOSIS — I83893 Varicose veins of bilateral lower extremities with other complications: Secondary | ICD-10-CM | POA: Diagnosis not present

## 2015-08-16 DIAGNOSIS — I82812 Embolism and thrombosis of superficial veins of left lower extremities: Secondary | ICD-10-CM | POA: Insufficient documentation

## 2015-08-16 DIAGNOSIS — I10 Essential (primary) hypertension: Secondary | ICD-10-CM | POA: Insufficient documentation

## 2015-08-18 ENCOUNTER — Ambulatory Visit (INDEPENDENT_AMBULATORY_CARE_PROVIDER_SITE_OTHER): Payer: 59 | Admitting: Psychology

## 2015-08-18 DIAGNOSIS — G4700 Insomnia, unspecified: Secondary | ICD-10-CM | POA: Diagnosis not present

## 2015-08-18 DIAGNOSIS — F411 Generalized anxiety disorder: Secondary | ICD-10-CM | POA: Diagnosis not present

## 2015-08-30 ENCOUNTER — Ambulatory Visit (INDEPENDENT_AMBULATORY_CARE_PROVIDER_SITE_OTHER): Payer: 59 | Admitting: Psychology

## 2015-08-30 DIAGNOSIS — G4700 Insomnia, unspecified: Secondary | ICD-10-CM | POA: Diagnosis not present

## 2015-08-30 DIAGNOSIS — F411 Generalized anxiety disorder: Secondary | ICD-10-CM

## 2015-09-05 ENCOUNTER — Other Ambulatory Visit: Payer: Self-pay | Admitting: *Deleted

## 2015-09-05 DIAGNOSIS — I83811 Varicose veins of right lower extremities with pain: Secondary | ICD-10-CM

## 2015-09-06 ENCOUNTER — Ambulatory Visit (INDEPENDENT_AMBULATORY_CARE_PROVIDER_SITE_OTHER): Payer: 59 | Admitting: Psychology

## 2015-09-06 DIAGNOSIS — G894 Chronic pain syndrome: Secondary | ICD-10-CM | POA: Diagnosis not present

## 2015-09-06 DIAGNOSIS — F411 Generalized anxiety disorder: Secondary | ICD-10-CM

## 2015-09-11 ENCOUNTER — Encounter: Payer: Self-pay | Admitting: Vascular Surgery

## 2015-09-13 ENCOUNTER — Ambulatory Visit: Payer: 59 | Admitting: Psychology

## 2015-09-18 ENCOUNTER — Encounter: Payer: Self-pay | Admitting: Vascular Surgery

## 2015-09-18 ENCOUNTER — Ambulatory Visit (INDEPENDENT_AMBULATORY_CARE_PROVIDER_SITE_OTHER): Payer: 59 | Admitting: Vascular Surgery

## 2015-09-18 VITALS — BP 122/73 | HR 70 | Temp 97.6°F | Resp 16 | Ht 66.0 in | Wt 145.0 lb

## 2015-09-18 DIAGNOSIS — I83891 Varicose veins of right lower extremities with other complications: Secondary | ICD-10-CM | POA: Diagnosis not present

## 2015-09-18 DIAGNOSIS — I83892 Varicose veins of left lower extremities with other complications: Secondary | ICD-10-CM | POA: Insufficient documentation

## 2015-09-18 NOTE — Progress Notes (Signed)
Subjective:     Patient ID: Kari Gibbs, female   DOB: 01-15-1951, 65 y.o.   MRN: [id number]  HPI This 65 year old female had laser ablation of the right great saphenous vein from the proximal calf to near the saphenofemoral junction plus greater than 20 stab phlebectomy of painful varicosities performed under local tumescent anesthesia. She tolerated the procedure well. A total of 2200 J of energy was utilized.   Review of Systems     Objective:   Physical Exam BP 122/73 mmHg  Pulse 70  Temp(Src) 97.6 F (36.4 C)  Resp 16  Ht 5\' 6"  (1.676 m)  Wt 145 lb (65.772 kg)  BMI 23.41 kg/m2  SpO2 98%       Assessment:      well tolerated laser ablation right great saphenous vein plus greater than 20 stab phlebectomy of painful varicosities performed under local tumescent anesthesia     Plan:      return in 1 week for venous duplex exam to confirm closure right great saphenous vein Patient also has sclerotherapy scheduled in the near future

## 2015-09-18 NOTE — Progress Notes (Signed)
Laser Ablation Procedure    Date: 09/18/2015   Kari Gibbs DOB:14-Jun-1951  Consent signed: Yes    Surgeon:  Dr. Nelda Severe. Kellie Simmering  Procedure: Laser Ablation: right Greater Saphenous Vein  BP 122/73 mmHg  Pulse 70  Temp(Src) 97.6 F (36.4 C)  Resp 16  Ht 5\' 6"  (1.676 m)  Wt 145 lb (65.772 kg)  BMI 23.41 kg/m2  SpO2 98%  Tumescent Anesthesia: 498 cc 0.9% NaCl with 50 cc Lidocaine HCL with 1% Epi and 15 cc 8.4% NaHCO3  Local Anesthesia: 6 cc Lidocaine HCL and NaHCO3 (ratio 2:1)  Pulsed Mode: 15 watts, 530ms delay, 1.0 duration  Total Energy:2302              Total Pulses:   154             Total Time: 2:33    Stab Phlebectomy: >20 Sites: Thigh and Calf  Patient tolerated procedure well  Notes:   Description of Procedure:  After marking the course of the secondary varicosities, the patient was placed on the operating table in the supine position, and the right leg was prepped and draped in sterile fashion.   Local anesthetic was administered and under ultrasound guidance the saphenous vein was accessed with a micro needle and guide wire; then the mirco puncture sheath was placed.  A guide wire was inserted saphenofemoral junction , followed by a 5 french sheath.  The position of the sheath and then the laser fiber below the junction was confirmed using the ultrasound.  Tumescent anesthesia was administered along the course of the saphenous vein using ultrasound guidance. The patient was placed in Trendelenburg position and protective laser glasses were placed on patient and staff, and the laser was fired at 15 watts continuous mode advancing 1-76mm/second for a total of 2302 joules.   For stab phlebectomies, local anesthetic was administered at the previously marked varicosities, and tumescent anesthesia was administered around the vessels.  Greater than 20 stab wounds were made using the tip of an 11 blade. And using the vein hook, the phlebectomies were performed using a hemostat  to avulse the varicosities.  Adequate hemostasis was achieved.     Steri strips were applied to the stab wounds and ABD pads and thigh high compression stockings were applied.  Ace wrap bandages were applied over the phlebectomy sites and at the top of the saphenofemoral junction. Blood loss was less than 15 cc.  The patient ambulated out of the operating room having tolerated the procedure well.

## 2015-09-19 ENCOUNTER — Encounter: Payer: Self-pay | Admitting: Vascular Surgery

## 2015-09-19 ENCOUNTER — Telehealth: Payer: Self-pay | Admitting: *Deleted

## 2015-09-19 NOTE — Telephone Encounter (Signed)
Pt doing well now. Had to loosen the lower ace wrap during the night because it was causing her pain. Otherwise doing well this am. Following all instructions.

## 2015-09-20 ENCOUNTER — Ambulatory Visit (INDEPENDENT_AMBULATORY_CARE_PROVIDER_SITE_OTHER): Payer: 59 | Admitting: Psychology

## 2015-09-20 DIAGNOSIS — F411 Generalized anxiety disorder: Secondary | ICD-10-CM | POA: Diagnosis not present

## 2015-09-20 DIAGNOSIS — G894 Chronic pain syndrome: Secondary | ICD-10-CM | POA: Diagnosis not present

## 2015-09-25 ENCOUNTER — Ambulatory Visit (HOSPITAL_COMMUNITY)
Admission: RE | Admit: 2015-09-25 | Discharge: 2015-09-25 | Disposition: A | Discharge status: Home / Self Care | Payer: 59 | Source: Ambulatory Visit | Attending: Vascular Surgery | Admitting: Vascular Surgery

## 2015-09-25 ENCOUNTER — Ambulatory Visit (INDEPENDENT_AMBULATORY_CARE_PROVIDER_SITE_OTHER): Payer: 59 | Admitting: Vascular Surgery

## 2015-09-25 ENCOUNTER — Encounter: Payer: Self-pay | Admitting: Vascular Surgery

## 2015-09-25 VITALS — BP 116/66 | HR 68 | Temp 97.9°F | Resp 14

## 2015-09-25 DIAGNOSIS — I83891 Varicose veins of right lower extremities with other complications: Secondary | ICD-10-CM

## 2015-09-25 DIAGNOSIS — I83811 Varicose veins of right lower extremities with pain: Secondary | ICD-10-CM | POA: Diagnosis not present

## 2015-09-25 DIAGNOSIS — Z9889 Other specified postprocedural states: Secondary | ICD-10-CM | POA: Diagnosis not present

## 2015-09-25 DIAGNOSIS — I10 Essential (primary) hypertension: Secondary | ICD-10-CM | POA: Diagnosis not present

## 2015-09-25 NOTE — Progress Notes (Signed)
Subjective:     Patient ID: Kari Gibbs, female   DOB: 05/14/1951, 65 y.o.   MRN: [id number]  HPI This 65 year old female returns 1 week post-laser ablation right great saphenous vein plus multiple stab phlebectomy of painful varicosities. She has worn her elastic compression stockings as instructed. She cannot take ibuprofen. She was taking Tylenol. She has had some mild-to-moderate discomfort along the medial aspect of the right leg. She denies any chest pain, dyspnea on exertion, PND, orthopnea, hemoptysis. She has had no edema in the right ankle which is an improvement from prior to the procedure. Review of Systems     Objective:   Physical Exam BP 116/66 mmHg  Pulse 68  Temp(Src) 97.9 F (36.6 C)  Resp 14  SpO2 99%  Gen. well-developed well-nourished female in no apparent distress Right leg with moderate ecchymosis along course of great saphenous vein which is mildly tender to deep palpation. No distal edema noted. 3+ dorsalis pedis pulse palpable. Stab phlebectomy sites healing well.  I ordered a venous duplex exam of the right leg which I reviewed and interpreted. There is no DVT. There is total closure of the great saphenous vein from the distal thigh to near the saphenofemoral junction.     Assessment:     Successful laser ablation right great saphenous vein with multiple stab phlebectomy of painful varicosities     Plan:     Patient will return in April for first of 2 courses of sclerotherapy

## 2015-09-26 DIAGNOSIS — Z0279 Encounter for issue of other medical certificate: Secondary | ICD-10-CM | POA: Diagnosis not present

## 2015-09-27 ENCOUNTER — Encounter: Payer: Self-pay | Admitting: Pain Medicine

## 2015-09-27 ENCOUNTER — Telehealth: Payer: Self-pay | Admitting: *Deleted

## 2015-09-27 ENCOUNTER — Ambulatory Visit: Payer: 59 | Attending: Pain Medicine | Admitting: Pain Medicine

## 2015-09-27 ENCOUNTER — Other Ambulatory Visit: Payer: Self-pay | Admitting: Pain Medicine

## 2015-09-27 VITALS — BP 120/58 | HR 77 | Temp 97.8°F | Resp 16 | Ht 66.0 in | Wt 146.0 lb

## 2015-09-27 DIAGNOSIS — I8390 Asymptomatic varicose veins of unspecified lower extremity: Secondary | ICD-10-CM | POA: Diagnosis not present

## 2015-09-27 DIAGNOSIS — M549 Dorsalgia, unspecified: Secondary | ICD-10-CM | POA: Diagnosis present

## 2015-09-27 DIAGNOSIS — K589 Irritable bowel syndrome without diarrhea: Secondary | ICD-10-CM | POA: Insufficient documentation

## 2015-09-27 DIAGNOSIS — R109 Unspecified abdominal pain: Secondary | ICD-10-CM | POA: Insufficient documentation

## 2015-09-27 DIAGNOSIS — F419 Anxiety disorder, unspecified: Secondary | ICD-10-CM | POA: Insufficient documentation

## 2015-09-27 DIAGNOSIS — K5903 Drug induced constipation: Secondary | ICD-10-CM | POA: Diagnosis not present

## 2015-09-27 DIAGNOSIS — Z79891 Long term (current) use of opiate analgesic: Secondary | ICD-10-CM | POA: Diagnosis not present

## 2015-09-27 DIAGNOSIS — G8929 Other chronic pain: Secondary | ICD-10-CM | POA: Insufficient documentation

## 2015-09-27 DIAGNOSIS — F329 Major depressive disorder, single episode, unspecified: Secondary | ICD-10-CM | POA: Insufficient documentation

## 2015-09-27 DIAGNOSIS — F1190 Opioid use, unspecified, uncomplicated: Secondary | ICD-10-CM | POA: Diagnosis not present

## 2015-09-27 DIAGNOSIS — Z9884 Bariatric surgery status: Secondary | ICD-10-CM | POA: Diagnosis not present

## 2015-09-27 DIAGNOSIS — I10 Essential (primary) hypertension: Secondary | ICD-10-CM | POA: Insufficient documentation

## 2015-09-27 DIAGNOSIS — K219 Gastro-esophageal reflux disease without esophagitis: Secondary | ICD-10-CM | POA: Diagnosis not present

## 2015-09-27 DIAGNOSIS — M792 Neuralgia and neuritis, unspecified: Secondary | ICD-10-CM

## 2015-09-27 DIAGNOSIS — Z5181 Encounter for therapeutic drug level monitoring: Secondary | ICD-10-CM

## 2015-09-27 DIAGNOSIS — R1012 Left upper quadrant pain: Secondary | ICD-10-CM | POA: Diagnosis not present

## 2015-09-27 DIAGNOSIS — T402X5A Adverse effect of other opioids, initial encounter: Secondary | ICD-10-CM

## 2015-09-27 MED ORDER — MORPHINE SULFATE ER 30 MG PO TBCR: 30.0000 mg | EXTENDED_RELEASE_TABLET | Freq: Two times a day (BID) | ORAL | Status: DC

## 2015-09-27 MED ORDER — GABAPENTIN 300 MG PO CAPS: 900.0000 mg | ORAL_CAPSULE | Freq: Four times a day (QID) | ORAL | Status: DC

## 2015-09-27 MED ORDER — NALOXONE HCL 0.4 MG/0.4ML IJ SOAJ: INTRAMUSCULAR | Status: DC

## 2015-09-27 NOTE — Progress Notes (Signed)
Patient's Name: Kari Gibbs MRN: [id number] DOB: Nov 29, 1950 DOS: 09/27/2015  Primary Reason(s) for Visit: Encounter for Medication Management CC: Abdominal Pain and Back Pain   HPI  Ms. Loch is a 65 y.o. year old, female patient, who returns today as an established patient. 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; S/P laparoscopic cholecystectomy Oct 2014; Fracture of rib of left side; Varicose veins of leg with complications; Chronic pain; Long term current use of opiate analgesic; Long term prescription opiate use; Opiate use (60 MME/day); Encounter for therapeutic drug level monitoring; Opiate dependence ([place]); Encounter for chronic pain management; Chronic pain syndrome; Chronic flank pain (Location of Primary Source of Pain) (Left); Chronic thoracic radicular pain (Left-sided); Opioid-induced constipation (OIC); Occipital neuralgia of left side; Neuropathic pain; Neurogenic pain; History of rib fracture (Left 4th and 8th rib); Vertebral body hemangioma (T11); Metatarsalgia of right foot; Chronic abdominal pain (Bilateral) (L>R) (possible left-sided thoracic radiculopathy); Osteoarthritis; Return to work evaluation (FCE: Medium); Varicose veins of bilateral lower extremities with other complications; and Varicose veins of right lower extremity with complications on her problem list.. Her primarily concern today is the Abdominal Pain and Back Pain   The patient comes in today clinics today for pharmacological management of her chronic pain.  Reported Pain Score: 4  (last pain medication last hs @ 2100) Reported level is inconsistent with clinical obrservations. Pain Type: Chronic pain Pain Location: Abdomen (LUQ of abdomen that radiates around to mid back) Pain Orientation: Left Pain Descriptors / Indicators: Sharp, Constant, Spasm Pain Frequency: Constant  Date of Last  Visit: 06/28/15 Service Provided on Last Visit: Med Refill  Controlled Substance Pharmacotherapy  Analgesic: MS Contin 30 mg every 12 hours MME/day: 60 mg/day Pharmacokinetics: Onset of action (Liberation/Absorption): Within expected pharmacological parameters. (45 minutes) Time to Peak effect (Distribution): Timing and results are as within normal expected parameters. (1.5 hours) Duration of action (Metabolism/Excretion): Within normal limits for medication. (9-12 hours) Pharmacodynamics: Analgesic Effect: 90% Activity Facilitation: Medication(s) allow patient to sit, stand, walk, and do the basic ADLs Perceived Effectiveness: Described as relatively effective, allowing for increase in activities of daily living (ADL) Side-effects or Adverse reactions: Minimal. She indicates getting a little wobbly. Monitoring: [place] PMP: Compliant with practice rules and regulations UDS Results/interpretation: The patient's last UDS was done on 06/28/2015 and it came back within normal limits with no unexpected results. Medication Assessment Form: Reviewed. Patient indicates being compliant with therapy Treatment compliance: Compliant Risk Assessment: Substance Use Disorder (SUD) Risk Level: Low  Pharmacologic Plan: Continue therapy as is  Lab Work: Illicit Drugs No results found for: THCU, COCAINSCRNUR, PCPSCRNUR, MDMA, AMPHETMU, METHADONE, ETOH  Inflammation Markers Lab Results  Component Value Date   ESRSEDRATE 16 04/08/2014    Renal Function Lab Results  Component Value Date   BUN 18 11/18/2014   CREATININE 1.04 11/18/2014   GFRAA 65* 11/18/2014   GFRNONAA 56* 11/18/2014    Hepatic Function Lab Results  Component Value Date   AST 38* 01/13/2014   ALT 27 01/13/2014   ALBUMIN 4.1 01/13/2014    Electrolytes Lab Results  Component Value Date   NA 135 11/18/2014   K 4.3 11/18/2014   CL 101 11/18/2014   CALCIUM 8.7 11/18/2014   MG 1.9 01/13/2014    Allergies  Ms. Marchant  is allergic to iohexol; ace inhibitors; and losartan potassium.  Meds  The patient has a current medication list which includes  the following prescription(s): chlorpheniramine maleate, ferrous sulfate, gabapentin, lubiprostone, magnesium, morphine, morphine, morphine, omeprazole, potassium gluconate, prenatal vit-fe fum-fa-omega, align, teriparatide (recombinant), UNABLE TO FIND, valsartan, venlafaxine xr, benefiber, and naloxone hcl.  Current Outpatient Prescriptions on File Prior to Visit  Medication Sig  . CHLORPHENIRAMINE MALEATE PO Take 4 mg by mouth every 6 (six) hours as needed.  . ferrous sulfate 325 (65 FE) MG tablet Take 325 mg by mouth daily with breakfast.  . lubiprostone (AMITIZA) 8 MCG capsule Take 1 capsule (8 mcg total) by mouth 2 (two) times daily with a meal. Swallow the medication whole. Do not break or chew the medication.  . Magnesium 500 MG TABS Take 1 tablet by mouth 2 (two) times daily.  Marland Kitchen omeprazole (PRILOSEC) 20 MG capsule Take 20 mg by mouth 2 (two) times daily.  . potassium gluconate 595 MG TABS tablet Take 595 mg by mouth daily as needed (low potassium).  Marland Kitchen PRENATAL VIT-FE FUM-FA-OMEGA PO Take 1 tablet by mouth daily.  . Probiotic Product (ALIGN) 4 MG CAPS Take 1 capsule by mouth at bedtime. Per Pt.  . Teriparatide, Recombinant, (FORTEO) 600 MCG/2.4ML SOLN Inject 20 mcg into the skin daily.  Marland Kitchen UNABLE TO FIND Take 1 tablet by mouth daily as needed (nausea). Med Name: [facility] [address] Pepogest (Peppermint oil)  . valsartan (DIOVAN) 40 MG tablet Take 40 mg by mouth every morning.  . venlafaxine XR (EFFEXOR-XR) 75 MG 24 hr capsule Take 225 mg by mouth every morning.  . Wheat Dextrin (BENEFIBER) POWD Stir 2 teaspoons of Benefiber into 4-8 oz of any non-carbonated beverage or soft food (hot or cold) TID.   No current facility-administered medications on file prior to visit.    ROS  Constitutional: Afebrile, no chills, well hydrated and well nourished Gastrointestinal:  negative Musculoskeletal:negative Neurological: negative Behavioral/Psych: negative  PFSH  Medical:  Ms. Fenrich  has a past medical history of Fatigue; Nausea & vomiting; IBS (irritable bowel syndrome); Abdominal pain; Hypertension; Depression; Pneumonia; Headache(784.0); Arthritis; SBO (small bowel obstruction) ([place]) (2013); Hiatal hernia (09/2009); Diverticulum of stomach (09/2009); Gastritis; Anxiety; GERD (gastroesophageal reflux disease); History of kidney stones; Varicose veins; History of rib fracture (Left 4th and 8th rib) (06/28/2015); and OSTEOARTHRITIS (05/01/2010). Family: family history includes Alzheimer's disease in her father; Hypertension in her mother. 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 Tobacco:  reports that she has never smoked. She has never used smokeless tobacco. Alcohol:  reports that she does not drink alcohol. Drug:  reports that she does not use illicit drugs.  Physical Exam  Vitals:  Today's Vitals   09/27/15 0958  BP: 120/58  Pulse: 77  Temp: 97.8 F (36.6 C)  TempSrc: Oral  Resp: 16  Height: 5\' 6"  (1.676 m)  Weight: 146 lb (66.225 kg)  SpO2: 100%  PainSc: 4     Calculated BMI: Body mass index is 23.58 kg/(m^2).  General appearance: alert, cooperative, appears stated age and no distress Eyes: PERLA Respiratory: No evidence respiratory distress, no audible rales or ronchi and no use of accessory muscles of respiration  Cervical Spine Inspection: Normal anatomy Alignment: Symetrical ROM: Adequate  Upper Extremities Inspection: No gross anomalies detected ROM: Adequate Sensory: Normal Motor: Unremarkable  Thoracic Spine Inspection: No gross anomalies detected Alignment:  Symetrical ROM: Adequate  Lumbar Spine Inspection: No gross anomalies  detected Alignment: Symetrical ROM: Adequate  Gait: WNL  Lower Extremities Inspection: No gross anomalies detected ROM: Adequate Sensory:  Normal Motor: Unremarkable  Assessment & Plan  Primary Diagnosis & Pertinent Problem List: The primary encounter diagnosis was Chronic pain. Diagnoses of Encounter for therapeutic drug level monitoring, Long term current use of opiate analgesic, Chronic left flank pain, Opioid-induced constipation (OIC), Neurogenic pain, Therapeutic opioid-induced constipation (OIC), and Opiate use (60 MME/day) were also pertinent to this visit.  Visit Diagnosis: 1. Chronic pain   2. Encounter for therapeutic drug level monitoring   3. Long term current use of opiate analgesic   4. Chronic left flank pain   5. Opioid-induced constipation (OIC)   6. Neurogenic pain   7. Therapeutic opioid-induced constipation (OIC)   8. Opiate use (60 MME/day)     Assessment: No problem-specific assessment & plan notes found for this encounter.   Plan of Care  Pharmacotherapy (Medications Ordered): Meds ordered this encounter  Medications  . morphine (MS CONTIN) 30 MG 12 hr tablet    Sig: Take 1 tablet (30 mg total) by mouth every 12 (twelve) hours.    Dispense:  60 tablet    Refill:  0    Do not place this medication, or any other prescription from our practice, on "Automatic Refill". Patient may have prescription filled one day early if pharmacy is closed on scheduled refill date. Do not fill until: 10/08/15 To last until: 11/07/15  . morphine (MS CONTIN) 30 MG 12 hr tablet    Sig: Take 1 tablet (30 mg total) by mouth every 12 (twelve) hours.    Dispense:  60 tablet    Refill:  0    Do not place this medication, or any other prescription from our practice, on "Automatic Refill". Patient may have prescription filled one day early if pharmacy is closed on scheduled refill date. Do not fill  until: 11/07/15 To last until: 12/07/15  . morphine (MS CONTIN) 30 MG 12 hr tablet    Sig: Take 1 tablet (30 mg total) by mouth every 12 (twelve) hours.    Dispense:  60 tablet    Refill:  0    Do not place this medication, or any other prescription from our practice, on "Automatic Refill". Patient may have prescription filled one day early if pharmacy is closed on scheduled refill date. Do not fill until: 12/07/15 To last until: 01/06/16  . gabapentin (NEURONTIN) 300 MG capsule    Sig: Take 3 capsules (900 mg total) by mouth 4 (four) times daily. May go up to 3 capsules 4 times a day. Titrate up slowly.    Dispense:  360 capsule    Refill:  2    Do not place this medication, or any other prescription from our practice, on "Automatic Refill". Patient may have prescription filled one day early if pharmacy is closed on scheduled refill date.  . Naloxone HCl 0.4 MG/0.4ML SOAJ    Sig: Get usage instructions at website.    Dispense:  1 Package    Refill:  PRN    Do not place this medication, or any other prescription from our practice, on "Automatic Refill".    Lab-work & Procedure Ordered: Orders Placed This Encounter  Procedures  . Drugs of abuse screen w/o alc, rtn urine-sln    Volume: 10 ml(s). Minimum 3 ml of urine is needed. Document temperature of fresh sample. Indications: Long term (current) use of opiate analgesic (Z79.891) Test#: [id number] ([employer] Select-13)  .  Comprehensive metabolic panel    Order Specific Question:  Has the patient fasted?    Answer:  No  . C-reactive protein  . Magnesium  . Sedimentation rate  . Vitamin B12    Indication: Bone Pain (M89.9)  . Vitamin D pnl(25-hydrxy+1,25-dihy)-bld    Imaging Ordered: None  Interventional Therapies: Scheduled: None at this time. PRN Procedures: None at this time.    Referral(s) or Consult(s): None at this time.  Medications administered during this visit: Ms. Murnahan had no medications administered during  this visit.  Future Appointments Date Time Provider [place]  10/04/2015 12:00 PM Kirtland Bouchard, PHD [facility] None  11/15/2015 10:15 AM Rolla Flatten, RN [facility]  12/20/2015 9:00 AM [name], MD [facility] None    Primary Care Physician: Cari Caraway, MD Location: [facility] Note by: Kathlen Brunswick. Dossie Arbour, M.D, DABA, DABAPM, DABPM, DABIPP, FIPP

## 2015-09-27 NOTE — Progress Notes (Signed)
Patient here for medication refill.  Gabapentin titration seems to working for her and any issues that she was having have gotten better is almost to the top of titration.    Patient has an insurance form for LD  Patient has asked about procedure for headaches.  Morphine tablets count 22/60 , last fill 09/08/2015  Patient would like to know if she should continue Amitiza

## 2015-10-04 ENCOUNTER — Ambulatory Visit (INDEPENDENT_AMBULATORY_CARE_PROVIDER_SITE_OTHER): Payer: 59 | Admitting: Psychology

## 2015-10-04 DIAGNOSIS — F411 Generalized anxiety disorder: Secondary | ICD-10-CM

## 2015-10-04 DIAGNOSIS — G4700 Insomnia, unspecified: Secondary | ICD-10-CM

## 2015-10-06 LAB — TOXASSURE SELECT 13 (MW), URINE: PDF: 0

## 2015-10-11 ENCOUNTER — Telehealth: Payer: Self-pay | Admitting: Pain Medicine

## 2015-10-11 ENCOUNTER — Ambulatory Visit (INDEPENDENT_AMBULATORY_CARE_PROVIDER_SITE_OTHER): Payer: 59 | Admitting: Psychology

## 2015-10-11 ENCOUNTER — Ambulatory Visit: Payer: PRIVATE HEALTH INSURANCE | Admitting: Psychology

## 2015-10-11 DIAGNOSIS — G894 Chronic pain syndrome: Secondary | ICD-10-CM

## 2015-10-11 DIAGNOSIS — F411 Generalized anxiety disorder: Secondary | ICD-10-CM | POA: Diagnosis not present

## 2015-10-11 NOTE — Telephone Encounter (Signed)
[phone] would like to speak with Dr. Dossie Arbour about results of papers sent in to [facility]

## 2015-10-12 ENCOUNTER — Other Ambulatory Visit: Payer: Self-pay | Admitting: Pain Medicine

## 2015-10-12 DIAGNOSIS — G894 Chronic pain syndrome: Secondary | ICD-10-CM

## 2015-10-12 NOTE — Telephone Encounter (Signed)
Attempted to call patient, no answer 

## 2015-10-12 NOTE — Progress Notes (Signed)
Apparently the patient's insurance company requires an updated functional capacity evaluation for her long-term disability. Therefore, we will be ordering an no one today.

## 2015-10-13 ENCOUNTER — Telehealth: Payer: Self-pay | Admitting: Pain Medicine

## 2015-10-13 NOTE — Telephone Encounter (Signed)
Parameds wants entire records for patient from 01-11-15 to present, please call her to discuss if she needs to sent to [facility] to get these

## 2015-10-17 ENCOUNTER — Telehealth: Payer: Self-pay | Admitting: Pain Medicine

## 2015-10-17 NOTE — Telephone Encounter (Signed)
Has another issue that needs help resolving [facility]

## 2015-10-17 NOTE — Telephone Encounter (Signed)
Cant leave message in mailbox.

## 2015-10-18 ENCOUNTER — Ambulatory Visit (INDEPENDENT_AMBULATORY_CARE_PROVIDER_SITE_OTHER): Payer: 59 | Admitting: Psychology

## 2015-10-18 DIAGNOSIS — G894 Chronic pain syndrome: Secondary | ICD-10-CM

## 2015-10-18 DIAGNOSIS — F411 Generalized anxiety disorder: Secondary | ICD-10-CM | POA: Diagnosis not present

## 2015-10-19 ENCOUNTER — Telehealth: Payer: Self-pay

## 2015-10-19 NOTE — Telephone Encounter (Signed)
Called physical therapy concerning pts FCE. Referral was put in wrong so Janelle helped me fix it to where PT could see referral on their end. I was told by Angus Palms PT would call pt and schedule appointment ASAP

## 2015-10-23 ENCOUNTER — Telehealth: Payer: Self-pay | Admitting: Pain Medicine

## 2015-10-23 NOTE — Telephone Encounter (Signed)
She had FCE appt and is 11-15-15, this may cause her a problem, would like to discuss with Nonnie Done , please call her at [phone]

## 2015-10-24 NOTE — Telephone Encounter (Signed)
Discussed with patient that The issue of waiting for FCE was discussed with Clarisa Schools regarding disability.

## 2015-10-25 ENCOUNTER — Ambulatory Visit (INDEPENDENT_AMBULATORY_CARE_PROVIDER_SITE_OTHER): Payer: 59 | Admitting: Psychology

## 2015-10-25 DIAGNOSIS — F411 Generalized anxiety disorder: Secondary | ICD-10-CM

## 2015-10-25 DIAGNOSIS — G894 Chronic pain syndrome: Secondary | ICD-10-CM | POA: Diagnosis not present

## 2015-11-01 ENCOUNTER — Ambulatory Visit: Payer: 59 | Admitting: Psychology

## 2015-11-02 ENCOUNTER — Ambulatory Visit: Payer: 59 | Attending: Pain Medicine | Admitting: Pain Medicine

## 2015-11-02 ENCOUNTER — Encounter: Payer: Self-pay | Admitting: Pain Medicine

## 2015-11-02 DIAGNOSIS — Z9049 Acquired absence of other specified parts of digestive tract: Secondary | ICD-10-CM | POA: Diagnosis not present

## 2015-11-02 DIAGNOSIS — I83899 Varicose veins of unspecified lower extremities with other complications: Secondary | ICD-10-CM | POA: Insufficient documentation

## 2015-11-02 DIAGNOSIS — Z8781 Personal history of (healed) traumatic fracture: Secondary | ICD-10-CM | POA: Diagnosis not present

## 2015-11-02 DIAGNOSIS — Z79891 Long term (current) use of opiate analgesic: Secondary | ICD-10-CM | POA: Insufficient documentation

## 2015-11-02 DIAGNOSIS — R51 Headache: Secondary | ICD-10-CM | POA: Diagnosis present

## 2015-11-02 DIAGNOSIS — J309 Allergic rhinitis, unspecified: Secondary | ICD-10-CM | POA: Insufficient documentation

## 2015-11-02 DIAGNOSIS — Z9884 Bariatric surgery status: Secondary | ICD-10-CM | POA: Insufficient documentation

## 2015-11-02 DIAGNOSIS — I10 Essential (primary) hypertension: Secondary | ICD-10-CM | POA: Diagnosis not present

## 2015-11-02 DIAGNOSIS — K5903 Drug induced constipation: Secondary | ICD-10-CM | POA: Diagnosis not present

## 2015-11-02 DIAGNOSIS — K219 Gastro-esophageal reflux disease without esophagitis: Secondary | ICD-10-CM | POA: Diagnosis not present

## 2015-11-02 DIAGNOSIS — F329 Major depressive disorder, single episode, unspecified: Secondary | ICD-10-CM | POA: Insufficient documentation

## 2015-11-02 DIAGNOSIS — R109 Unspecified abdominal pain: Secondary | ICD-10-CM | POA: Insufficient documentation

## 2015-11-02 DIAGNOSIS — M5414 Radiculopathy, thoracic region: Secondary | ICD-10-CM | POA: Diagnosis not present

## 2015-11-02 DIAGNOSIS — M1990 Unspecified osteoarthritis, unspecified site: Secondary | ICD-10-CM | POA: Insufficient documentation

## 2015-11-02 DIAGNOSIS — M5481 Occipital neuralgia: Secondary | ICD-10-CM | POA: Diagnosis not present

## 2015-11-02 DIAGNOSIS — G8929 Other chronic pain: Secondary | ICD-10-CM | POA: Diagnosis not present

## 2015-11-02 MED ORDER — ROPIVACAINE HCL 2 MG/ML IJ SOLN: 5.0000 mL/h | Freq: Once | INTRAMUSCULAR | Status: DC

## 2015-11-02 MED ORDER — DEXAMETHASONE SODIUM PHOSPHATE 10 MG/ML IJ SOLN
INTRAMUSCULAR | Status: AC
  Administered 2015-11-02: 11:00:00
  Filled 2015-11-02: qty 1

## 2015-11-02 MED ORDER — DEXAMETHASONE SODIUM PHOSPHATE 10 MG/ML IJ SOLN: 10.0000 mg | Freq: Once | INTRAMUSCULAR | Status: DC

## 2015-11-02 MED ORDER — ROPIVACAINE HCL 2 MG/ML IJ SOLN
INTRAMUSCULAR | Status: AC
  Administered 2015-11-02: 11:00:00
  Filled 2015-11-02: qty 10

## 2015-11-02 MED ORDER — LIDOCAINE HCL (PF) 1 % IJ SOLN
INTRAMUSCULAR | Status: AC
  Administered 2015-11-02: 11:00:00
  Filled 2015-11-02: qty 5

## 2015-11-02 MED ORDER — LIDOCAINE HCL (PF) 1 % IJ SOLN: 10.0000 mL | Freq: Once | INTRAMUSCULAR | Status: DC

## 2015-11-02 NOTE — Progress Notes (Signed)
Safety precautions to be maintained throughout the outpatient stay will include: orient to surroundings, keep bed in low position, maintain call bell within reach at all times, provide assistance with transfer out of bed and ambulation.  

## 2015-11-02 NOTE — Progress Notes (Signed)
Patient's Name: Kari Gibbs MRN: [id number] DOB: April 25, 1951 DOS: 11/02/2015  Primary Reason(s) for Visit: Interventional Pain Management Treatment. CC: Headache   Pre-Procedure Assessment:  Kari Gibbs is a 65 y.o. year old, female patient, seen today for interventional treatment. 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; S/P laparoscopic cholecystectomy Oct 2014; Fracture of rib of left side; Varicose veins of leg with complications; Chronic pain; Long term current use of opiate analgesic; Long term prescription opiate use; Opiate use (60 MME/day); Encounter for therapeutic drug level monitoring; Opiate dependence (Kari Gibbs); Encounter for chronic pain management; Chronic pain syndrome; Chronic flank pain (Location of Primary Source of Pain) (Left); Chronic thoracic radicular pain (Left-sided); Opioid-induced constipation (OIC); Occipital neuralgia of left side; Neuropathic pain; Neurogenic pain; History of rib fracture (Left 4th and 8th rib); Vertebral body hemangioma (T11); Metatarsalgia of right foot; Chronic abdominal pain (Bilateral) (L>R) (possible left-sided thoracic radiculopathy); Osteoarthritis; Return to work evaluation (FCE: Medium); Varicose veins of bilateral lower extremities with other complications; and Varicose veins of right lower extremity with complications on her problem list.. Her primarily concern today is the Headache   Today's Initial Pain Score: 4/10 Reported level of pain is incompatible with clinical obrservations. This may be secondary to a possible lack of understanding on how the pain scale works. Pain Descriptors / Indicators: Constant, Dull, Aching Pain Frequency: Constant  Post-procedure Pain Score: 4   Date of Last Visit: 09/27/15 Service Provided on Last Visit: Med Refill, Evaluation  Verification of the correct person, correct site (including  marking of site), and correct procedure were performed and confirmed by the patient.  Today's Vitals   11/02/15 1118 11/02/15 1123 11/02/15 1128 11/02/15 1130  BP: 111/99 113/87 129/66 133/57  Pulse: 74 66 76 79  Temp:      TempSrc:      Resp: _0 Height:      Weight:      SpO2: 99% 99% 100% 100%  PainSc:      Calculated BMI: Body mass index is 24.06 kg/(m^2). Allergies: She is allergic to iohexol; ace inhibitors; and losartan potassium.Marland Kitchen No Primary Diagnosis found  Procedure:  Type: Diagnostic, Greater, Occipital Nerve Block Region: Posterolateral Cervical Level: Occipital Ridge   Laterality: Left-Sided   Indications: 1. Occipital neuralgia of left side     In addition, Kari Gibbs has Chronic pain; Chronic pain syndrome; Chronic flank pain (Location of Primary Source of Pain) (Left); Chronic thoracic radicular pain (Left-sided); Occipital neuralgia of left side; Neuropathic pain; Neurogenic pain; History of rib fracture (Left 4th and 8th rib); Vertebral body hemangioma (T11); Metatarsalgia of right foot; Chronic abdominal pain (Bilateral) (L>R) (possible left-sided thoracic radiculopathy); and Osteoarthritis on her pertinent problem list.  Consent: Secured. Under the influence of no sedatives a written informed consent was obtained, after having provided information on the risks and possible complications. To fulfill our ethical and legal obligations, as recommended by the American Medical Association's Code of Ethics, we have provided information to the patient about our clinical impression; the nature and purpose of the treatment or procedure; the risks, benefits, and possible complications of the intervention; alternatives; the risk(s) and benefit(s) of the alternative treatment(s) or procedure(s); and the risk(s) and benefit(s) of doing nothing. The patient was provided information about the risks and possible complications associated with the procedure. These include,  but are not limited to, failure to achieve desired goals, infection, bleeding, organ  or nerve damage, allergic reactions, paralysis, and death. In the case of spinal procedures these may include, but are not limited to, failure to achieve desired goals, infection, bleeding, organ or nerve damage, allergic reactions, paralysis, and death. In addition, the patient was informed that Medicine is not an exact science; therefore, there is also the possibility of unforeseen risks and possible complications that may result in a catastrophic outcome. The patient indicated having understood very clearly. We have given the patient no guarantees and we have made no promises. Enough time was given to the patient to ask questions, all of which were answered to the patient's satisfaction.  Pre-Procedure Preparation: Safety Precautions: Allergies reviewed. Appropriate site, procedure, and patient were confirmed by following the Joint Commission's Universal Protocol (UP.01.01.01), in the form of a "Time Out". The patient was asked to confirm marked site and procedure, before commencing. The patient was asked about blood thinners, or active infections, both of which were denied. Patient was assessed for positional comfort and all pressure points were checked before starting procedure. Monitoring:  As per clinic protocol. Infection Control Precautions: Sterile technique used. Standard Universal Precautions were taken as recommended by the Department of [facility] for Disease Control and Prevention (CDC). Standard pre-surgical skin prep was conducted. Respiratory hygiene and cough etiquette was practiced. Hand hygiene observed. Safe injection practices and needle disposal techniques followed. SDV (single dose vial) medications used. Medications properly checked for expiration dates and contaminants. Personal protective equipment (PPE) used: Sterile surgical gloves.  Anesthesia, Analgesia, Anxiolysis:  Type: Local  Anesthesia Local Anesthetic: Lidocaine 1% Route: Infiltration ([place]/IM) IV Access: Declined Sedation: Declined  Indication(s): Analgesia    Description of Procedure Process:  Time-out: "Time-out" completed before starting procedure, as per protocol. Position: Prone Target Area: Area medial to the occipital artery at the level of the superior nuchal ridge Approach: Posterior approach Area Prepped: Entire Posterior Occipital Region Prepping solution: ChloraPrep (2% chlorhexidine gluconate and 70% isopropyl alcohol) Safety Precautions: Aspiration looking for blood return was conducted prior to all injections. At no point did we inject any substances, as a needle was being advanced. No attempts were made at seeking any paresthesias. Safe injection practices and needle disposal techniques used. Medications properly checked for expiration dates. SDV (single dose vial) medications used. Contrast Allergy precautions taken.   Description of the Procedure: Protocol guidelines were followed. The target area was identified and the area prepped in the usual manner. Skin & deeper tissues infiltrated with local anesthetic. Appropriate amount of time allowed to pass for local anesthetics to take effect. The procedure needles were then advanced to the target area. Proper needle placement secured. Negative aspiration confirmed. Solution injected in intermittent fashion, asking for systemic symptoms every 0.5cc of injectate. The needles were then removed and the area cleansed, making sure to leave some of the prepping solution back to take advantage of its long term bactericidal properties.  EBL: None Materials & Medications Used:  Needle(s) Used: 25g - 1.5" Needle(s) Solution Injected: 0.2% PF-Ropivacaine (55m) + SDV-Decadron 10 mg/ml (179m    Medication(s): Please see chart orders for medication and dosing details.  Imaging Guidance:  Type of Imaging Technique: Fluoroscopy Guidance Indication(s): Assistance in  needle guidance and placement for procedures requiring needle placement in or near specific anatomical locations not easily accessible without such assistance. Exposure Time: Please see nurses notes. Fluoroscopic Guidance: I was personally present in the fluoroscopy suite, where the patient was placed in position for the procedure, over the fluoroscopy-compatible table. Fluoroscopy  was manipulated to obtain the best possible view of the target area, on the affected side. Permanently recorded images stored by scanning into EMR. Interpretation: Intraoperative imaging interpretation by performing Physician. Adequate needle placement confirmed. Needle placement confirmed in AP, lateral, & Oblique Views. Permanent hardcopy images in multiple planes scanned into the patient's record.  Antibiotic Prophylaxis:  Indication(s): No indications identified. Type:  Antibiotics Given (last 72 hours)    None       Post-operative Assessment:  Complications: No immediate post-treatment complications were observed. Disposition: The patient was discharged home, once institutional criteria were met. Return to clinic in 2 weeks for follow-up evaluation and interpretation of results. The patient tolerated the entire procedure well. A repeat set of vitals were taken after the procedure and the patient was kept under observation following institutional policy, for this type of procedure. Post-procedural neurological assessment was performed, showing return to baseline, prior to discharge. The patient was provided with post-procedure discharge instructions, including a section on how to identify potential problems. Should any problems arise concerning this procedure, the patient was given instructions to immediately contact us, at any time, without hesitation. In any case, we plan to contact the patient by telephone for a follow-up status report regarding this interventional procedure. Comments:  No additional relevant  information.  Medications administered during this visit: We administered lidocaine (PF), ropivacaine (PF) 2 mg/ml (0.2%), and dexamethasone.  Prescriptions ordered during this visit: New Prescriptions   No medications on file    Future Appointments Date Time Provider Cope  11/08/2015 12:00 PM Kirtland Bouchard, PHD [facility] None  11/13/2015 10:20 AM [name], MD [facility] None  11/15/2015 10:15 AM Rolla Flatten, RN [facility]  11/15/2015 1:00 PM Pura Spice, PT [facility] None  12/20/2015 9:00 AM [name], MD [facility] None    Primary Care Physician: Cari Caraway, MD Location: [facility] Facility Note by: Kathlen Brunswick. Dossie Arbour, M.D, DABA, DABAPM, DABPM, DABIPP, FIPP  Disclaimer:  Medicine is not an exact science. The only guarantee in medicine is that nothing is guaranteed. It is important to note that the decision to proceed with this intervention was based on the information collected from the patient. The Data and conclusions were drawn from the patient's questionnaire, the interview, and the physical examination. Because the information was provided in large part by the patient, it cannot be guaranteed that it has not been purposely or unconsciously manipulated. Every effort has been made to obtain as much relevant data as possible for this evaluation. It is important to note that the conclusions that lead to this procedure are derived in large part from the available data. Always take into account that the treatment will also be dependent on availability of resources and existing treatment guidelines, considered by other Pain Management Practitioners as being common knowledge and practice, at the time of the intervention. For Medico-Legal purposes, it is also important to point out that variation in procedural techniques and pharmacological choices are the acceptable norm. The indications, contraindications, technique, and results of the  above procedure should only be interpreted and judged by a Board-Certified Interventional Pain Specialist with extensive familiarity and expertise in the same exact procedure and technique. Attempts at providing opinions without similar or greater experience and expertise than that of the treating physician will be considered as inappropriate and unethical, and shall result in a formal complaint to the state medical board and applicable specialty societies.

## 2015-11-02 NOTE — Patient Instructions (Signed)
Pain Management Discharge Instructions  General Discharge Instructions :  If you need to reach your doctor call: Monday-Friday 8:00 am - 4:00 pm at [phone] or toll free [phone].  After clinic hours [phone] to have operator reach doctor.  Bring all of your medication bottles to all your appointments in the pain clinic.  To cancel or reschedule your appointment with Pain Management please remember to call 24 hours in advance to avoid a fee.  Refer to the educational materials which you have been given on: General Risks, I had my Procedure. Discharge Instructions, Post Sedation.  Post Procedure Instructions:  The drugs you were given will stay in your system until tomorrow, so for the next 24 hours you should not drive, make any legal decisions or drink any alcoholic beverages.  You may eat anything you prefer, but it is better to start with liquids then soups and crackers, and gradually work up to solid foods.  Please notify your doctor immediately if you have any unusual bleeding, trouble breathing or pain that is not related to your normal pain.  Depending on the type of procedure that was done, some parts of your body may feel week and/or numb.  This usually clears up by tonight or the next day.  Walk with the use of an assistive device or accompanied by an adult for the 24 hours.  You may use ice on the affected area for the first 24 hours.  Put ice in a Ziploc bag and cover with a towel and place against area 15 minutes on 15 minutes off.  You may switch to heat after 24 hours.  Occipital Nerve Block Patient Information  Description: The occipital nerves originate in the cervical (neck) spinal cord and travel upward through muscle and tissue to supply sensation to the back of the head and top of the scalp.  In addition, the nerves control some of the muscles of the scalp.  Occipital neuralgia is an irritation of these nerves which can cause headaches, numbness of the  scalp, and neck discomfort.     The occipital nerve block will interrupt nerve transmission through these nerves and can relieve pain and spasm.  The block consists of insertion of a small needle under the skin in the back of the head to deposit local anesthetic (numbing medicine) and/or steroids around the nerve.  The entire block usually lasts less than 5 minutes.  Conditions which may be treated by occipital blocks:   Muscular pain and spasm of the scalp  Nerve irritation, back of the head  Headaches  Upper neck pain  Preparation for the injection:  1. Do not eat any solid food or dairy products within 8 hours of your appointment. 2. You may drink clear liquids up to 3 hours before appointment.  Clear liquids include water, black coffee, juice or soda.  No milk or cream please. 3. You may take your regular medication, including pain medications, with a sip of water before you appointment.  Diabetics should hold regular insulin (if taken separately) and take 1/2 normal NPH dose the morning of the procedure.  Carry some sugar containing items with you to your appointment. 4. A driver must accompany you and be prepared to drive you home after your procedure. 5. Bring all your current medications with you. 6. An IV may be inserted and sedation may be given at the discretion of the physician. 7. A blood pressure cuff, EKG, and other monitors will often be applied during the procedure.    Some patients may need to have extra oxygen administered for a short period. 8. You will be asked to provide medical information, including your allergies and medications, prior to the procedure.  We must know immediately if you are taking blood thinners (like Coumadin/Warfarin) or if you are allergic to IV iodine contrast (dye).  We must know if you could possible be pregnant.  9. Do not wear a high collared shirt or turtleneck.  Tie long hair up in the back if possible.  Possible side-effects:   Bleeding  from needle site  Infection (rare, may require surgery)  Nerve injury (rare)  Hair on back of neck can be tinged with iodine scrub (this will wash out)  Light-headedness (temporary)  Pain at injection site (several days)  Decreased blood pressure (rare, temporary)  Seizure (very rare)  Call if you experience:   Hives or difficulty breathing ( go to the emergency room)  Inflammation or drainage at the injection site(s)  Please note:  Although the local anesthetic injected can often make your painful muscles or headache feel good for several hours after the injection, the pain may return.  It takes 3-7 days for steroids to work.  You may not notice any pain relief for at least one week.  If effective, we will often do a series of injections spaced 3-6 weeks apart to maximally decrease your pain.  If you have any questions, please call [phone] [facility]  

## 2015-11-03 ENCOUNTER — Telehealth: Payer: Self-pay | Admitting: *Deleted

## 2015-11-03 NOTE — Telephone Encounter (Signed)
Unable to contact patient with this phone number.

## 2015-11-07 ENCOUNTER — Encounter: Payer: Self-pay | Admitting: *Deleted

## 2015-11-08 ENCOUNTER — Ambulatory Visit (INDEPENDENT_AMBULATORY_CARE_PROVIDER_SITE_OTHER): Payer: 59 | Admitting: Psychology

## 2015-11-08 DIAGNOSIS — G894 Chronic pain syndrome: Secondary | ICD-10-CM

## 2015-11-08 DIAGNOSIS — F411 Generalized anxiety disorder: Secondary | ICD-10-CM | POA: Diagnosis not present

## 2015-11-13 ENCOUNTER — Ambulatory Visit: Payer: 59 | Attending: Pain Medicine | Admitting: Pain Medicine

## 2015-11-13 ENCOUNTER — Encounter: Payer: Self-pay | Admitting: Pain Medicine

## 2015-11-13 VITALS — BP 110/50 | HR 59 | Temp 97.8°F | Resp 16 | Ht 66.0 in | Wt 149.0 lb

## 2015-11-13 DIAGNOSIS — Z90710 Acquired absence of both cervix and uterus: Secondary | ICD-10-CM | POA: Diagnosis not present

## 2015-11-13 DIAGNOSIS — J309 Allergic rhinitis, unspecified: Secondary | ICD-10-CM | POA: Diagnosis not present

## 2015-11-13 DIAGNOSIS — F329 Major depressive disorder, single episode, unspecified: Secondary | ICD-10-CM | POA: Insufficient documentation

## 2015-11-13 DIAGNOSIS — D1809 Hemangioma of other sites: Secondary | ICD-10-CM | POA: Insufficient documentation

## 2015-11-13 DIAGNOSIS — Z9884 Bariatric surgery status: Secondary | ICD-10-CM | POA: Insufficient documentation

## 2015-11-13 DIAGNOSIS — G8929 Other chronic pain: Secondary | ICD-10-CM | POA: Diagnosis not present

## 2015-11-13 DIAGNOSIS — K219 Gastro-esophageal reflux disease without esophagitis: Secondary | ICD-10-CM | POA: Diagnosis not present

## 2015-11-13 DIAGNOSIS — R1012 Left upper quadrant pain: Secondary | ICD-10-CM | POA: Diagnosis not present

## 2015-11-13 DIAGNOSIS — I10 Essential (primary) hypertension: Secondary | ICD-10-CM | POA: Insufficient documentation

## 2015-11-13 DIAGNOSIS — X58XXXA Exposure to other specified factors, initial encounter: Secondary | ICD-10-CM | POA: Insufficient documentation

## 2015-11-13 DIAGNOSIS — M5481 Occipital neuralgia: Secondary | ICD-10-CM | POA: Diagnosis not present

## 2015-11-13 DIAGNOSIS — S2232XA Fracture of one rib, left side, initial encounter for closed fracture: Secondary | ICD-10-CM | POA: Insufficient documentation

## 2015-11-13 DIAGNOSIS — M5414 Radiculopathy, thoracic region: Secondary | ICD-10-CM | POA: Diagnosis not present

## 2015-11-13 DIAGNOSIS — R1011 Right upper quadrant pain: Secondary | ICD-10-CM

## 2015-11-13 DIAGNOSIS — R51 Headache: Secondary | ICD-10-CM | POA: Diagnosis present

## 2015-11-13 DIAGNOSIS — Z9049 Acquired absence of other specified parts of digestive tract: Secondary | ICD-10-CM | POA: Insufficient documentation

## 2015-11-13 DIAGNOSIS — R109 Unspecified abdominal pain: Secondary | ICD-10-CM | POA: Insufficient documentation

## 2015-11-13 DIAGNOSIS — K5903 Drug induced constipation: Secondary | ICD-10-CM | POA: Insufficient documentation

## 2015-11-13 DIAGNOSIS — I83893 Varicose veins of bilateral lower extremities with other complications: Secondary | ICD-10-CM | POA: Diagnosis not present

## 2015-11-13 NOTE — Progress Notes (Signed)
Patient's Name: Kari Gibbs Patient type: Established  MRN: [id number] Service setting: Ambulatory outpatient  DOB: December 05, 1950   DOS: 11/13/2015    Primary Reason(s) for Visit: Encounter for post-procedure evaluation of chronic illness with mild to moderate exacerbation CC: Headache   HPI  Kari Gibbs is a 65 y.o. year old, female patient, who returns today as an established patient. 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); Encounter for chronic pain management; Chronic pain syndrome; Chronic flank pain (Location of Primary Source of Pain) (Left); Chronic thoracic radicular pain (Left-sided); Opioid-induced constipation (OIC); Occipital neuralgia of left side; Neuropathic pain; Neurogenic pain; History of rib fracture (Left 4th and 8th rib); Vertebral body hemangioma (T11); Metatarsalgia of right foot; Chronic abdominal pain (Bilateral) (L>R) (possible left-sided thoracic radiculopathy); Osteoarthritis; Return to work evaluation (FCE: Medium); Varicose veins of bilateral lower extremities with other complications; and Varicose veins of right lower extremity with complications on her problem list.. Her primarily concern today is the Headache   Pain Assessment: Self-Reported Pain Score: 2  Reported level is compatible with observation Pain Type: Chronic pain Pain Location: Head Pain Orientation: Anterior Pain Descriptors / Indicators: Aching, Constant Pain Frequency: Constant  The patient returns to the clinic today after a diagnostic left sided greater occipital nerve block under  fluoroscopic guidance without IV sedation. The patient seems to be doing well with regards to her medications and her flank pain. Her other chronic conditions seemed to be stable and under control.  Date of Last Visit: 11/02/15 Service Provided on Last Visit: Procedure (bilateral occipital nerve block)  Post-Procedure Assessment  Procedure done on last visit: Diagnostic left-sided greater occipital nerve block under fluoroscopic guidance, no sedation. Side-effects or Adverse reactions: None reported Sedation: Sedation given  Results: Ultra-Short Term Relief (First 1 hour after procedure): 100 %  No IV Analgesics or Anxiolytics given, therefore the benefit is completely due to Local Anesthetics Short Term Relief (Initial 4-6 hrs after procedure): 70 % Complete relief confirms area to be the source of pain Long Term Relief : 85 % Long-term benefit would suggest an inflammatory etiology to the pain   Current Relief (Now): 85%  Persistent relief would suggest effective anti-inflammatory effects from steroids Interpretation of Results: Based on the results obtained, it would confirm that the area of the left greater occipital nerve is involved in the patient's pain syndrome.  Laboratory Chemistry  Inflammation Markers Lab Results  Component Value Date   ESRSEDRATE 16 04/08/2014    Renal Function Lab Results  Component Value Date   BUN 18 11/18/2014   CREATININE 1.04 11/18/2014   GFRAA 65* 11/18/2014   GFRNONAA 56* 11/18/2014    Hepatic Function Lab Results  Component Value Date   AST 38* 01/13/2014   ALT 27 01/13/2014   ALBUMIN 4.1 01/13/2014    Electrolytes Lab Results  Component Value Date   NA 135 11/18/2014   K 4.3 11/18/2014   CL 101 11/18/2014   CALCIUM 8.7 11/18/2014   MG 1.9 01/13/2014    Pain Modulating Vitamins No results found for: [place], VD125OH2TOT, [id number], [id number], VITAMINB12  Coagulation Parameters No results found for: INR, LABPROT  Note: I  personally reviewed the above data. Results shared with patient.  Meds  The patient has a current medication list which includes the following prescription(s): chlorpheniramine maleate, doxycycline, ferrous sulfate, gabapentin, lubiprostone, magnesium, metronidazole (topical), morphine, morphine, morphine, naloxone hcl, omeprazole, potassium gluconate, prenatal vit-fe fum-fa-omega, align, teriparatide (recombinant), triamcinolone ointment, UNABLE TO FIND, valsartan, venlafaxine xr, vitamin d (ergocalciferol), and benefiber.  Current Outpatient Prescriptions on File Prior to Visit  Medication Sig  . CHLORPHENIRAMINE MALEATE PO Take 4 mg by mouth every 6 (six) hours as needed.  . ferrous sulfate 325 (65 FE) MG tablet Take 325 mg by mouth daily with breakfast.  . gabapentin (NEURONTIN) 300 MG capsule Take 3 capsules (900 mg total) by mouth 4 (four) times daily. May go up to 3 capsules 4 times a day. Titrate up slowly.  . lubiprostone (AMITIZA) 8 MCG capsule Take 1 capsule (8 mcg total) by mouth 2 (two) times daily with a meal. Swallow the medication whole. Do not break or chew the medication.  . Magnesium 500 MG TABS Take 1 tablet by mouth 2 (two) times daily.  Marland Kitchen morphine (MS CONTIN) 30 MG 12 hr tablet Take 1 tablet (30 mg total) by mouth every 12 (twelve) hours.  Marland Kitchen morphine (MS CONTIN) 30 MG 12 hr tablet Take 1 tablet (30 mg total) by mouth every 12 (twelve) hours.  Marland Kitchen morphine (MS CONTIN) 30 MG 12 hr tablet Take 1 tablet (30 mg total) by mouth every 12 (twelve) hours.  . Naloxone HCl 0.4 MG/0.4ML SOAJ Get usage instructions at website.  Marland Kitchen omeprazole (PRILOSEC) 20 MG capsule Take 20 mg by mouth 2 (two) times daily.  . potassium gluconate 595 MG TABS tablet Take 595 mg by mouth daily as needed (low potassium).  Marland Kitchen PRENATAL VIT-FE FUM-FA-OMEGA PO Take 1 tablet by mouth daily.  . Probiotic Product (ALIGN) 4 MG CAPS Take 1 capsule by mouth at bedtime. Per Pt.  . Teriparatide, Recombinant, (FORTEO) 600  MCG/2.4ML SOLN Inject 20 mcg into the skin daily.  Marland Kitchen UNABLE TO FIND Take 1 tablet by mouth daily as needed (nausea). Med Name: [place] Pepogest (Peppermint oil)  . valsartan (DIOVAN) 40 MG tablet Take 40 mg by mouth every morning.  . venlafaxine XR (EFFEXOR-XR) 75 MG 24 hr capsule Take 225 mg by mouth every morning.  . Wheat Dextrin (BENEFIBER) POWD Stir 2 teaspoons of Benefiber into 4-8 oz of any non-carbonated beverage or soft food (hot or cold) TID.   No current facility-administered medications on file prior to visit.    ROS  Constitutional: Afebrile, no chills, well hydrated and well nourished Gastrointestinal: No upper or lower GI bleeding, no nausea, no vomiting and no acute GI distress Musculoskeletal: No acute joint swelling or redness, no acute loss of range of motion and no acute onset weakness Neurological: Denies any acute onset apraxia, no episodes of paralysis, no acute loss of coordination, no acute loss of consciousness and no acute onset aphasia, dysarthria, agnosia, or amnesia  Allergies  Kari Gibbs is allergic to iohexol; ace inhibitors; and losartan potassium.  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 Family: family history includes Alzheimer's disease in her father; Hypertension  in her mother. Surgical:  has past surgical history that includes Gastric bypass (11/13/2009); Exploratory laparotomy (2012); TMJ- LEFT--ABOUT 10 YRS AGO--LEFT SIDE--PT NOW HAS SOME PAIN LEFT JAW (Left); laparoscopy (03/04/2012); Hernia repair  (2011); Abdominal hysterectomy (1988); and Cholecystectomy (N/A, 05/18/2013). Tobacco:  reports that she has never smoked. She has never used smokeless tobacco. Alcohol:  reports that she does not drink alcohol. Drug:  reports that she does not use illicit drugs.  Physical Examination  Constitutional Vitals:  Today's Vitals   11/13/15 1056 11/13/15 1058  BP: 110/50   Pulse: 59   Temp: 97.8 F (36.6 C)   TempSrc: Oral   Resp: 16   Height: 5\' 6"  (1.676 m)   Weight: 149 lb (67.586 kg)   SpO2: 99%   PainSc: 2  2   PainLoc: Head     Calculated BMI: Body mass index is 24.06 kg/(m^2).    General appearance: alert, cooperative, appears stated age and no distress Eyes: PERLA Respiratory: No evidence respiratory distress, no audible rales or ronchi and no use of accessory muscles of respiration  Cervical Spine Inspection: Normal anatomy Alignment: Symetrical AROM: Adequate  Upper Extremities Right  Left  Inspection: No gross anomalies detected  Inspection: No gross anomalies detected  AROM: Adequate  AROM: Adequate  Sensory: Normal  Sensory: Normal  Motor: Unremarkable       Motor: Unremarkable        Thoracic Spine Inspection: No gross anomalies detected Alignment: Symetrical AROM: Adequate  Lumbar Spine Inspection: No gross anomalies detected Alignment: Symetrical AROM: Adequate  Gait: WNL  Lower Extremities Right  Left  Inspection: No gross anomalies detected  Inspection: No gross anomalies detected  AROM: Adequate  AROM: Adequate  Sensory:  Normal  Sensory:  Normal  Motor: Unremarkable       Motor: Unremarkable        Assessment & Plan  Primary Diagnosis & Pertinent Problem List: The primary encounter diagnosis was Occipital neuralgia of left side. Diagnoses of Chronic flank pain (Location of Primary Source of Pain) (Left) and Chronic abdominal pain (Bilateral) (L>R) (possible left-sided thoracic radiculopathy) were also pertinent to this visit.  Visit  Diagnosis: 1. Occipital neuralgia of left side   2. Chronic flank pain (Location of Primary Source of Pain) (Left)   3. Chronic abdominal pain (Bilateral) (L>R) (possible left-sided thoracic radiculopathy)     Problem-specific Plan(s): No problem-specific assessment & plan notes found for this encounter.   Plan of Care  Pharmacotherapy (Medications Ordered): No orders of the defined types were placed in this encounter.    Lab-work & Procedure Ordered: Orders Placed This Encounter  Procedures  . GREATER OCCIPITAL NERVE BLOCK    Standing Status: Standing     Number of Occurrences: 1     Standing Expiration Date: 11/12/2016    Scheduling Instructions:     Side: Left-sided     Sedation: No Sedation.     Timeframe: PRN Procedure. Patient will call.    Order Specific Question:  Where will this procedure be performed?    Answer:  [facility]    Imaging Ordered: None  Interventional Therapies: Scheduled:  None at this time. Considering:  Possible left greater occipital nerve cryo-neurolysis versus RFA. PRN Procedures:  Repeat left-sided greater occipital nerve block under fluoroscopic guidance, no sedation.   Referral(s) or Consult(s): None at this time.  Medications administered during this visit: Kari Gibbs had no medications administered during this visit.  Future Appointments Date Time Provider [place]  11/15/2015 10:15 AM Rolla Flatten, RN [facility]  11/15/2015 1:00 PM Pura Spice, PT [facility] None  12/20/2015 9:00 AM [name], MD [facility] None    Primary Care Physician: Cari Caraway, MD Location: [facility] Facility Note by: Kathlen Brunswick. Dossie Arbour, M.D, DABA, DABAPM, DABPM, DABIPP, FIPP  Pain Score Disclaimer: We use the NRS-11 scale. This is a self-reported, subjective measurement of pain severity with only modest accuracy. It is used primarily to identify changes within a particular patient. It must be  understood that outpatient pain scales are significantly less accurate that those used for research, where they can be applied under ideal controlled circumstances with minimal exposure to variables. In reality, the score is likely to be a combination of pain intensity and pain affect, where pain affect describes the degree of emotional arousal or changes in action readiness caused by the sensory experience of pain. Factors such as social and work situation, setting, emotional state, anxiety levels, expectation, and prior pain experience may influence pain perception and show large inter-individual differences that may also be affected by time variables.

## 2015-11-13 NOTE — Progress Notes (Signed)
Safety precautions to be maintained throughout the outpatient stay will include: orient to surroundings, keep bed in low position, maintain call bell within reach at all times, provide assistance with transfer out of bed and ambulation. Patient didi not bring medications today. She did  Bring the pain diary.

## 2015-11-15 ENCOUNTER — Ambulatory Visit: Payer: 59 | Attending: Pain Medicine | Admitting: Physical Therapy

## 2015-11-15 ENCOUNTER — Encounter: Payer: Self-pay | Admitting: Physical Therapy

## 2015-11-15 ENCOUNTER — Ambulatory Visit (INDEPENDENT_AMBULATORY_CARE_PROVIDER_SITE_OTHER): Payer: 59 | Admitting: *Deleted

## 2015-11-15 DIAGNOSIS — M6281 Muscle weakness (generalized): Secondary | ICD-10-CM | POA: Diagnosis present

## 2015-11-15 DIAGNOSIS — R10A2 Flank pain, left side: Secondary | ICD-10-CM

## 2015-11-15 DIAGNOSIS — R262 Difficulty in walking, not elsewhere classified: Secondary | ICD-10-CM | POA: Diagnosis present

## 2015-11-15 DIAGNOSIS — M25562 Pain in left knee: Secondary | ICD-10-CM

## 2015-11-15 DIAGNOSIS — R109 Unspecified abdominal pain: Secondary | ICD-10-CM | POA: Diagnosis present

## 2015-11-15 DIAGNOSIS — I83893 Varicose veins of bilateral lower extremities with other complications: Secondary | ICD-10-CM | POA: Diagnosis not present

## 2015-11-15 NOTE — Progress Notes (Signed)
X=.3% Sotradecol administered with a 27g butterfly.  Patient received a total of 12cc.  Treated all spider veins on the right leg and a few on the right with what was left over in the 2nd syringe. Easy access. Tol well. Anticipate good results. Will follow prn.  Photos: No.  Compression stockings applied: Yes.

## 2015-11-16 NOTE — Therapy (Addendum)
[facility] [address] [place], [place], [address] Phone: [phone]   Fax:  [phone]  Physical Therapy Evaluation  Patient Details  Name: Arri Kochan MRN: [id number] Date of Birth: [birth year] Referring Provider: Dr. Dossie Arbour  Encounter Date: 11/15/2015      PT End of Session - 11/16/15 1631    Visit Number 1   Number of Visits 1   Date for PT Re-Evaluation 11/16/15   PT Start Time 1302   PT Stop Time 1439   PT Time Calculation (min) 97 min   Activity Tolerance Patient limited by fatigue;Patient limited by pain   Behavior During Therapy [facility] for tasks assessed/performed      Past Medical History  Diagnosis Date  . Fatigue   . Nausea & vomiting   . IBS (irritable bowel syndrome)   . Abdominal pain   . Hypertension   . Depression     MOSTLY IN THE FALL OF THE YEAR  . Pneumonia     WHEN PT IN 6 TH GRADE  . Headache(784.0)     HX OF MIGRAINES  . Arthritis     OA LOWER SPINE  . SBO (small bowel obstruction) ([place]) 2013  . Hiatal hernia 09/2009  . 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 Gastritis     Distal gastritis  . Anxiety   . GERD (gastroesophageal reflux disease)     "told that she does"  . History of kidney stones   . Varicose veins   . History of rib fracture (Left 4th and 8th rib) 06/28/2015  . OSTEOARTHRITIS 05/01/2010    Qualifier: Diagnosis of  By: Megan Salon MD, John      Past Surgical History  Procedure Laterality Date  . Gastric bypass  11/13/2009  . Exploratory laparotomy  2012  . Tmj- left--about 10 yrs ago--left side--pt now has some pain left jaw Left     still has pain, scar tissue has been removed  . 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 Hernia repair  2011    hiatal  . Abdominal hysterectomy  1988    partial   . Cholecystectomy N/A 05/18/2013    Procedure: LAPAROSCOPIC CHOLECYSTECTOMY WITH INTRAOPERATIVE CHOLANGIOGRAM/laparoscopy/ ;  Surgeon: Pedro Earls, MD;  Location: [facility];  Service: General;  Laterality: N/A;    There were no vitals filed for this visit.  Visit Diagnosis:  Pain in left knee  Left flank pain  Difficulty in walking, not elsewhere classified  Muscle weakness (generalized)      Subjective Assessment - 11/16/15 1628    Subjective See FCE report   Currently in Pain? Yes   Pain Score 4    Pain Location Knee   Pain Orientation Left   Multiple Pain Sites Yes   Pain Score 4   Pain Location Flank   Pain Orientation Left            [facility] PT Assessment - 11/16/15 0001    Assessment   Medical Diagnosis L knee pain/ L flank pain   Referring Provider Dr. Dossie Arbour   Onset Date/Surgical Date 05/18/16      See FCE               Plan - 11/16/15 1632    Clinical Impression  Statement See FCE report   Pt will benefit from skilled therapeutic intervention in order to improve on the following deficits Decreased strength;Difficulty walking;Improper body mechanics;Abnormal gait;Decreased activity tolerance;Decreased balance;Decreased mobility;Pain   Rehab Potential Fair   PT Frequency 1x / week   PT Next Visit Plan FCE only         Problem List Patient Active Problem List   Diagnosis Date Noted  . Varicose veins of right lower extremity with complications 99991111  . Varicose veins of bilateral lower extremities with other complications 0000000  . Chronic pain 06/28/2015  . Long term current use of opiate analgesic 06/28/2015  . Long term prescription opiate use 06/28/2015  . Opiate use (60 MME/day) 06/28/2015  . Encounter for therapeutic drug level monitoring 06/28/2015  . Opiate dependence ([place]) 06/28/2015  . Encounter for chronic pain management 06/28/2015  . Chronic pain syndrome 06/28/2015  . Chronic flank pain (Location of Primary Source  of Pain) (Left) 06/28/2015  . Chronic thoracic radicular pain (Left-sided) 06/28/2015  . Opioid-induced constipation (OIC) 06/28/2015  . Occipital neuralgia of left side 06/28/2015  . Neuropathic pain 06/28/2015  . Neurogenic pain 06/28/2015  . History of rib fracture (Left 4th and 8th rib) 06/28/2015  . 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 Metatarsalgia of right foot 06/28/2015  . Chronic abdominal pain (Bilateral) (L>R) (possible left-sided thoracic radiculopathy) 06/28/2015  . Osteoarthritis 06/28/2015  . Return to work evaluation (FCE: Medium) 06/28/2015  . Varicose veins of leg with complications [id number]  . Fracture of rib of left side 10/28/2013  . S/P laparoscopic cholecystectomy Oct 2014 05/18/2013  . 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 DEPRESSION 05/01/2010  . Hypertension 05/01/2010  . GERD 05/01/2010  . 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 ALLERGIC RHINITIS 06/26/2007   Pura Spice, PT, DPT # [phone]   11/16/2015, 4:34 PM  [facility] [address] [place], [place], [address] Phone: [phone]   Fax:  [phone]  Name: Marquette Rohwedder MRN: [id number] Date of Birth: 29-Sep-1950

## 2015-11-22 ENCOUNTER — Ambulatory Visit: Payer: 59 | Admitting: Psychology

## 2015-11-29 ENCOUNTER — Ambulatory Visit (INDEPENDENT_AMBULATORY_CARE_PROVIDER_SITE_OTHER): Payer: 59 | Admitting: Psychology

## 2015-11-29 DIAGNOSIS — G894 Chronic pain syndrome: Secondary | ICD-10-CM | POA: Diagnosis not present

## 2015-11-29 DIAGNOSIS — F411 Generalized anxiety disorder: Secondary | ICD-10-CM

## 2015-12-05 NOTE — Addendum Note (Signed)
Addended by: Pura Spice on: 12/05/2015 08:46 AM   Modules accepted: Orders

## 2015-12-06 ENCOUNTER — Ambulatory Visit: Payer: 59 | Admitting: Psychology

## 2015-12-13 ENCOUNTER — Ambulatory Visit: Payer: 59 | Admitting: Psychology

## 2015-12-14 ENCOUNTER — Other Ambulatory Visit: Payer: Self-pay

## 2015-12-20 ENCOUNTER — Encounter: Payer: Self-pay | Admitting: Pain Medicine

## 2015-12-20 ENCOUNTER — Ambulatory Visit: Payer: 59 | Admitting: Psychology

## 2015-12-27 ENCOUNTER — Ambulatory Visit: Payer: 59 | Admitting: Psychology

## 2016-01-01 ENCOUNTER — Other Ambulatory Visit: Payer: Self-pay | Admitting: Pain Medicine

## 2016-01-03 ENCOUNTER — Ambulatory Visit: Payer: 59 | Admitting: Psychology

## 2016-01-04 ENCOUNTER — Other Ambulatory Visit
Admission: RE | Admit: 2016-01-04 | Discharge: 2016-01-04 | Disposition: A | Discharge status: Home / Self Care | Payer: BLUE CROSS/BLUE SHIELD | Source: Ambulatory Visit | Attending: Pain Medicine | Admitting: Pain Medicine

## 2016-01-04 ENCOUNTER — Encounter: Payer: Self-pay | Admitting: Pain Medicine

## 2016-01-04 ENCOUNTER — Ambulatory Visit: Payer: BLUE CROSS/BLUE SHIELD | Attending: Pain Medicine | Admitting: Pain Medicine

## 2016-01-04 VITALS — BP 120/81 | HR 67 | Temp 97.6°F | Resp 18 | Ht 66.0 in | Wt 149.0 lb

## 2016-01-04 DIAGNOSIS — Z9049 Acquired absence of other specified parts of digestive tract: Secondary | ICD-10-CM | POA: Insufficient documentation

## 2016-01-04 DIAGNOSIS — G8929 Other chronic pain: Secondary | ICD-10-CM | POA: Diagnosis not present

## 2016-01-04 DIAGNOSIS — M5481 Occipital neuralgia: Secondary | ICD-10-CM | POA: Diagnosis not present

## 2016-01-04 DIAGNOSIS — Z90710 Acquired absence of both cervix and uterus: Secondary | ICD-10-CM | POA: Insufficient documentation

## 2016-01-04 DIAGNOSIS — I10 Essential (primary) hypertension: Secondary | ICD-10-CM | POA: Diagnosis not present

## 2016-01-04 DIAGNOSIS — Z5181 Encounter for therapeutic drug level monitoring: Secondary | ICD-10-CM | POA: Diagnosis not present

## 2016-01-04 DIAGNOSIS — M7741 Metatarsalgia, right foot: Secondary | ICD-10-CM | POA: Insufficient documentation

## 2016-01-04 DIAGNOSIS — S2232XA Fracture of one rib, left side, initial encounter for closed fracture: Secondary | ICD-10-CM | POA: Diagnosis not present

## 2016-01-04 DIAGNOSIS — D1809 Hemangioma of other sites: Secondary | ICD-10-CM | POA: Insufficient documentation

## 2016-01-04 DIAGNOSIS — J309 Allergic rhinitis, unspecified: Secondary | ICD-10-CM | POA: Diagnosis not present

## 2016-01-04 DIAGNOSIS — Z79891 Long term (current) use of opiate analgesic: Secondary | ICD-10-CM

## 2016-01-04 DIAGNOSIS — M792 Neuralgia and neuritis, unspecified: Secondary | ICD-10-CM

## 2016-01-04 DIAGNOSIS — K5903 Drug induced constipation: Secondary | ICD-10-CM

## 2016-01-04 DIAGNOSIS — Z87442 Personal history of urinary calculi: Secondary | ICD-10-CM | POA: Diagnosis not present

## 2016-01-04 DIAGNOSIS — K589 Irritable bowel syndrome without diarrhea: Secondary | ICD-10-CM | POA: Insufficient documentation

## 2016-01-04 DIAGNOSIS — X58XXXA Exposure to other specified factors, initial encounter: Secondary | ICD-10-CM | POA: Diagnosis not present

## 2016-01-04 DIAGNOSIS — F329 Major depressive disorder, single episode, unspecified: Secondary | ICD-10-CM | POA: Insufficient documentation

## 2016-01-04 DIAGNOSIS — K219 Gastro-esophageal reflux disease without esophagitis: Secondary | ICD-10-CM | POA: Diagnosis not present

## 2016-01-04 DIAGNOSIS — I868 Varicose veins of other specified sites: Secondary | ICD-10-CM | POA: Insufficient documentation

## 2016-01-04 DIAGNOSIS — M5414 Radiculopathy, thoracic region: Secondary | ICD-10-CM | POA: Diagnosis not present

## 2016-01-04 DIAGNOSIS — R109 Unspecified abdominal pain: Secondary | ICD-10-CM | POA: Insufficient documentation

## 2016-01-04 DIAGNOSIS — Z9884 Bariatric surgery status: Secondary | ICD-10-CM | POA: Insufficient documentation

## 2016-01-04 DIAGNOSIS — T402X5A Adverse effect of other opioids, initial encounter: Secondary | ICD-10-CM

## 2016-01-04 LAB — COMPREHENSIVE METABOLIC PANEL
ALT: 26 U/L (ref 14–54)
AST: 33 U/L (ref 15–41)
Albumin: 4.2 g/dL (ref 3.5–5.0)
Alkaline Phosphatase: 97 U/L (ref 38–126)
Anion gap: 7 (ref 5–15)
BUN: 20 mg/dL (ref 6–20)
CO2: 33 mmol/L — ABNORMAL HIGH (ref 22–32)
Calcium: 9.7 mg/dL (ref 8.9–10.3)
Chloride: 99 mmol/L — ABNORMAL LOW (ref 101–111)
Creatinine, Ser: 0.76 mg/dL (ref 0.44–1.00)
GFR calc Af Amer: >60 mL/min (ref >60)
GFR calc non Af Amer: >60 mL/min (ref >60)
Glucose, Bld: 96 mg/dL (ref 65–99)
Potassium: 4.6 mmol/L (ref 3.5–5.1)
Sodium: 139 mmol/L (ref 135–145)
Total Bilirubin: 0.7 mg/dL (ref 0.3–1.2)
Total Protein: 7 g/dL (ref 6.5–8.1)

## 2016-01-04 LAB — MAGNESIUM: Magnesium: 2.1 mg/dL (ref 1.7–2.4)

## 2016-01-04 LAB — C-REACTIVE PROTEIN: CRP: <0.5 mg/dL (ref <1.0)

## 2016-01-04 LAB — SEDIMENTATION RATE: Sed Rate: 17 mm/hr (ref 0–30)

## 2016-01-04 LAB — VITAMIN B12: Vitamin B-12: 321 pg/mL (ref 180–914)

## 2016-01-04 MED ORDER — LUBIPROSTONE 8 MCG PO CAPS: 8.0000 ug | ORAL_CAPSULE | Freq: Two times a day (BID) | ORAL | Status: DC

## 2016-01-04 MED ORDER — MORPHINE SULFATE ER 30 MG PO TBCR: 30.0000 mg | EXTENDED_RELEASE_TABLET | Freq: Two times a day (BID) | ORAL | Status: DC

## 2016-01-04 MED ORDER — GABAPENTIN 300 MG PO CAPS: 900.0000 mg | ORAL_CAPSULE | Freq: Four times a day (QID) | ORAL | Status: DC

## 2016-01-04 MED ORDER — BENEFIBER PO POWD: ORAL | Status: DC

## 2016-01-04 NOTE — Progress Notes (Signed)
Safety precautions to be maintained throughout the outpatient stay will include: orient to surroundings, keep bed in low position, maintain call bell within reach at all times, provide assistance with transfer out of bed and ambulation.  Morphine 30 mg. Pill count = 3

## 2016-01-04 NOTE — Progress Notes (Signed)
Patient's Name: Kari Gibbs  Patient type: Established  MRN: [id number]  Service setting: Ambulatory outpatient  DOB: 1951-01-05  Location: [facility]  DOS: 01/04/2016  Primary Care Physician: Cari Caraway, MD  Note by: Kathlen Brunswick. Dossie Arbour, M.D, DABA, Sarita Haver, DABPM, Milagros Evener, FIPP  Referring Physician: Cari Caraway, MD  Specialty: Board-Certified Interventional Pain Management  Last Visit to Pain Management: 01/01/2016   Primary Reason(s) for Visit: Encounter for prescription drug management (Level of risk: moderate) CC: Abdominal Pain   HPI  Ms. Marsteller is a 65 y.o. year old, female patient, who returns today as an established patient. 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; S/P laparoscopic cholecystectomy Oct 2014; Fracture of rib of left side; Varicose veins of leg with complications; Chronic pain; Long term current use of opiate analgesic; Long term prescription opiate use; Opiate use (60 MME/day); Encounter for therapeutic drug level monitoring; Opiate dependence ([place]); Encounter for chronic pain management; Chronic pain syndrome; Chronic flank pain (Location of Primary Source of Pain) (Left); Chronic thoracic radicular pain (Left-sided); Opioid-induced constipation (OIC); Occipital neuralgia of left side; Neuropathic pain; Neurogenic pain; History of rib fracture (Left 4th and 8th rib); Vertebral body hemangioma (T11); Metatarsalgia of right foot; Chronic abdominal pain (Bilateral) (L>R) (possible left-sided thoracic radiculopathy); Osteoarthritis; Return to work evaluation (FCE: Medium); Varicose veins of bilateral lower extremities with other complications; and Varicose veins of right lower extremity with complications on her problem list.. Her primarily concern today is the Abdominal Pain   Pain Assessment: Self-Reported Pain Score: 6 , clinically  she looks like a 2-3/10. Reported level is inconsistent with clinical obrservations Pain Type: Chronic pain Pain Location: Abdomen Pain Orientation: Left Pain Descriptors / Indicators: Aching, Throbbing, Burning Pain Frequency: Intermittent  The patient comes into the clinics today for pharmacological management of her chronic pain. I last saw this patient on 01/01/2016. The patient  reports that she does not use illicit drugs. Her body mass index is 24.06 kg/(m^2).  Date of Last Visit: 11/13/15 Service Provided on Last Visit: Med Refill  Controlled Substance Pharmacotherapy Assessment & REMS (Risk Evaluation and Mitigation Strategy)  Analgesic: MS Contin 30 mg every 12 hours (60 mg/day) Pill Count: Morphine 30 mg. Pill count = 3 MME/day: 60 mg/day Date of Last Visit: 11/13/15 Pharmacokinetics: Onset of action (Liberation/Absorption): Within expected pharmacological parameters Time to Peak effect (Distribution): Timing and results are as within normal expected parameters Duration of action (Metabolism/Excretion): Within normal limits for medication Pharmacodynamics: Analgesic Effect: More than 50% Activity Facilitation: Medication(s) allow patient to sit, stand, walk, and do the basic ADLs Perceived Effectiveness: Described as relatively effective, allowing for increase in activities of daily living (ADL) Side-effects or Adverse reactions: None reported Monitoring: [place] PMP: Online review of the past 45-month period conducted. Compliant with practice rules and regulations Last UDS on record: TOXASSURE SELECT 13  Date Value Ref Range Status  09/27/2015 FINAL  Final    Comment:    ==================================================================== TOXASSURE SELECT 13 (MW) ==================================================================== Test                             Result       Flag       Units Drug Present and Declared for Prescription Verification   Morphine  4555         EXPECTED   ng/mg creat    Potential sources of large amounts of morphine in the absence of    codeine include administration of morphine or use of heroin.   Hydromorphone                  199          EXPECTED   ng/mg creat    Hydromorphone may be present as a metabolite of morphine;    concentrations of hydromorphone rarely exceed 5% of the morphine    concentration when this is the source of hydromorphone. ==================================================================== Test                      Result    Flag   Units      Ref Range   Creatinine              136              mg/dL      >=20 ==================================================================== Declared Medications:  The flagging and interpretation on this report are based on the  following declared medications.  Unexpected results may arise from  inaccuracies in the declared medications.  **Note: The testing scope of this panel includes these medications:  Morphine  **Note: The testing scope of this panel does not include following  reported medications:  Gabapentin ==================================================================== For clinical consultation, please call [phone]. ====================================================================    UDS interpretation: Compliant Medication Assessment Form: Reviewed. Patient indicates being compliant with therapy Treatment compliance: Compliant Risk Assessment: Aberrant Behavior: None observed today Substance Use Disorder (SUD) Risk Level: No change since last visit Risk of opioid abuse or dependence: 0.7-3.0% with doses ? 36 MME/day and 6.1-26% with doses ? 120 MME/day. Opioid Risk Tool (ORT) Score: Total Score: 0 Low Risk for SUD (Score <3) Depression Scale Score: PHQ-2:      PHQ-9:       Pharmacologic Plan: No change in therapy, at this time  Laboratory Chemistry  Inflammation Markers Lab Results  Component Value Date    ESRSEDRATE 16 04/08/2014    Renal Function Lab Results  Component Value Date   BUN 18 11/18/2014   CREATININE 1.04 11/18/2014   GFRAA 65* 11/18/2014   GFRNONAA 56* 11/18/2014    Hepatic Function Lab Results  Component Value Date   AST 38* 01/13/2014   ALT 27 01/13/2014   ALBUMIN 4.1 01/13/2014    Electrolytes Lab Results  Component Value Date   NA 135 11/18/2014   K 4.3 11/18/2014   CL 101 11/18/2014   CALCIUM 8.7 11/18/2014   MG 1.9 01/13/2014    Pain Modulating Vitamins No results found for: [place], [id number], [id number], [id number], VITAMINB12  Coagulation Parameters No results found for: INR, LABPROT  Note: Labs reviewed.  Recent Diagnostic Imaging  No results found.  Meds  The patient has a current medication list which includes the following prescription(s): chlorpheniramine maleate, ferrous sulfate, gabapentin, lubiprostone, magnesium, morphine, morphine, morphine, omeprazole, potassium gluconate, prenatal vit-fe fum-fa-omega, align, teriparatide (recombinant), triamcinolone ointment, UNABLE TO FIND, valsartan, venlafaxine xr, vitamin d (ergocalciferol), and benefiber.  Current Outpatient Prescriptions on File Prior to Visit  Medication Sig  . CHLORPHENIRAMINE MALEATE PO Take 4 mg by mouth every 6 (six) hours as needed.  . ferrous sulfate 325 (65 FE) MG tablet Take 325 mg by mouth daily with breakfast.  . Magnesium 500 MG TABS Take 1 tablet by mouth 2 (  two) times daily.  Marland Kitchen omeprazole (PRILOSEC) 20 MG capsule Take 20 mg by mouth 2 (two) times daily.  . potassium gluconate 595 MG TABS tablet Take 595 mg by mouth daily as needed (low potassium).  Marland Kitchen PRENATAL VIT-FE FUM-FA-OMEGA PO Take 1 tablet by mouth daily.  . Probiotic Product (ALIGN) 4 MG CAPS Take 1 capsule by mouth at bedtime. Per Pt.  . Teriparatide, Recombinant, (FORTEO) 600 MCG/2.4ML SOLN Inject 20 mcg into the skin daily.  Marland Kitchen triamcinolone ointment (KENALOG) 0.1 % APPLY TO AFFECTED AREA TWICE A DAY  AS NEEDED  . UNABLE TO FIND Take 1 tablet by mouth daily as needed (nausea). Med Name: [address] [address] Pepogest (Peppermint oil)  . valsartan (DIOVAN) 40 MG tablet Take 40 mg by mouth every morning.  . venlafaxine XR (EFFEXOR-XR) 75 MG 24 hr capsule Take 225 mg by mouth every morning.  . Vitamin D, Ergocalciferol, (DRISDOL) 50000 units CAPS capsule 50,000 Units every 7 (seven) days.    No current facility-administered medications on file prior to visit.    ROS  Constitutional: Denies any fever or chills Gastrointestinal: No reported hemesis, hematochezia, vomiting, or acute GI distress Musculoskeletal: Denies any acute onset joint swelling, redness, loss of ROM, or weakness Neurological: No reported episodes of acute onset apraxia, aphasia, dysarthria, agnosia, amnesia, paralysis, loss of coordination, or loss of consciousness  Allergies  Ms. Paluck is allergic to iohexol; ace inhibitors; and losartan potassium.  [place]  Medical:  Ms. 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 Family: family history includes Alzheimer's disease in her father; Hypertension in her mother. 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 Tobacco:  reports that she has never smoked. She has never used smokeless tobacco. Alcohol:  reports  that she does not drink alcohol. Drug:  reports that she does not use illicit drugs.  Constitutional Exam  Vitals: Blood pressure 120/81, pulse 67, temperature 97.6 F (36.4 C), temperature source Oral, resp. rate 18, height 5\' 6"  (1.676 m), weight 149 lb (67.586 kg), SpO2 100 %. General appearance: Well nourished, well developed, and well hydrated. In no acute distress Calculated BMI/Body habitus: Body mass index is 24.06 kg/(m^2). (18.5-24.9 kg/m2) Ideal body weight Psych/Mental status: Alert and oriented x 3 (person, place, & time) Eyes: PERLA Respiratory: No evidence of acute respiratory distress  Cervical Spine Exam  Inspection: No masses, redness, or swelling Alignment: Symmetrical ROM: Functional: ROM is within functional limits [facility]) Stability: No instability detected Muscle strength & Tone: Functionally intact Sensory: Unimpaired Palpation: No complaints of tenderness  Upper Extremity (UE) Exam    Side: Right upper extremity  Side: Left upper extremity  Inspection: No masses, redness, swelling, or asymmetry  Inspection: No masses, redness, swelling, or asymmetry  ROM:  ROM:  Functional: ROM is within functional limits [facility])  Functional: ROM is within functional limits [facility])  Muscle strength & Tone: Functionally intact  Muscle strength & Tone: Functionally intact  Sensory: Unimpaired  Sensory: Unimpaired  Palpation: Non-contributory  Palpation: Non-contributory   Thoracic  Spine Exam  Inspection: No masses, redness, or swelling Alignment: Symmetrical ROM: Functional: ROM is within functional limits [facility]) Stability: No instability detected Sensory: Unimpaired Muscle strength & Tone: Functionally intact Palpation: No complaints of tenderness  Lumbar Spine Exam  Inspection: No masses, redness, or swelling Alignment: Symmetrical ROM: Functional: ROM is within functional limits [facility]) Stability: No instability detected Muscle strength & Tone: Functionally  intact Sensory: Unimpaired Palpation: No complaints of tenderness Provocative Tests: Lumbar Hyperextension and rotation test: deferred Patrick's Maneuver: deferred  Gait & Posture Assessment  Ambulation: Unassisted Gait: Unaffected Posture: WNL  Lower Extremity Exam    Side: Right lower extremity  Side: Left lower extremity  Inspection: No masses, redness, swelling, or asymmetry ROM:  Inspection: No masses, redness, swelling, or asymmetry ROM:  Functional: ROM is within functional limits [facility])  Functional: ROM is within functional limits [facility])  Muscle strength & Tone: Functionally intact  Muscle strength & Tone: Functionally intact  Sensory: Unimpaired  Sensory: Unimpaired  Palpation: Non-contributory  Palpation: Non-contributory   Assessment & Plan  Primary Diagnosis & Pertinent Problem List: The primary encounter diagnosis was Chronic pain. Diagnoses of Encounter for therapeutic drug level monitoring, Long term current use of opiate analgesic, Occipital neuralgia of left side, Neurogenic pain, and Therapeutic opioid-induced constipation (OIC) were also pertinent to this visit.  Visit Diagnosis: 1. Chronic pain   2. Encounter for therapeutic drug level monitoring   3. Long term current use of opiate analgesic   4. Occipital neuralgia of left side   5. Neurogenic pain   6. Therapeutic opioid-induced constipation (OIC)     Problems updated and reviewed during this visit: No problems updated.  Problem-specific Plan(s): No problem-specific assessment & plan notes found for this encounter.  No new assessment & plan notes have been filed under this hospital service since the last note was generated. Service: Pain Management   Plan of Care   Problem List Items Addressed This Visit      High   Chronic pain - Primary (Chronic)   Relevant Medications   morphine (MS CONTIN) 30 MG 12 hr tablet   morphine (MS CONTIN) 30 MG 12 hr tablet   morphine (MS CONTIN) 30 MG 12 hr  tablet   gabapentin (NEURONTIN) 300 MG capsule   Other Relevant Orders   Comprehensive metabolic panel   C-reactive protein   Magnesium   Sedimentation rate   Vitamin B12   25-Hydroxyvitamin D Lcms D2+D3   Neurogenic pain (Chronic)   Relevant Medications   gabapentin (NEURONTIN) 300 MG capsule   Occipital neuralgia of left side (Chronic)   Relevant Medications   morphine (MS CONTIN) 30 MG 12 hr tablet   morphine (MS CONTIN) 30 MG 12 hr tablet   morphine (MS CONTIN) 30 MG 12 hr tablet   Other Relevant Orders   GREATER OCCIPITAL NERVE BLOCK     Medium   Encounter for therapeutic drug level monitoring   Long term current use of opiate analgesic (Chronic)   Relevant Orders   ToxASSURE Select 13 (MW), Urine   Opioid-induced constipation (OIC) (Chronic)   Relevant Medications   lubiprostone (AMITIZA) 8 MCG capsule   Wheat Dextrin (BENEFIBER) POWD       Pharmacotherapy (Medications Ordered): Meds ordered this encounter  Medications  . morphine (MS CONTIN) 30 MG 12 hr tablet    Sig: Take 1 tablet (30 mg total) by mouth every 12 (twelve) hours.    Dispense:  60 tablet    Refill:  0  Do not place this medication, or any other prescription from our practice, on "Automatic Refill". Patient may have prescription filled one day early if pharmacy is closed on scheduled refill date. Do not fill until: 01/06/16 To last until: 02/05/16  . morphine (MS CONTIN) 30 MG 12 hr tablet    Sig: Take 1 tablet (30 mg total) by mouth every 12 (twelve) hours.    Dispense:  60 tablet    Refill:  0    Do not place this medication, or any other prescription from our practice, on "Automatic Refill". Patient may have prescription filled one day early if pharmacy is closed on scheduled refill date. Do not fill until: 02/05/16 To last until: 03/06/16  . morphine (MS CONTIN) 30 MG 12 hr tablet    Sig: Take 1 tablet (30 mg total) by mouth every 12 (twelve) hours.    Dispense:  60 tablet    Refill:  0     Do not place this medication, or any other prescription from our practice, on "Automatic Refill". Patient may have prescription filled one day early if pharmacy is closed on scheduled refill date. Do not fill until: 03/06/16 To last until: 04/05/16  . gabapentin (NEURONTIN) 300 MG capsule    Sig: Take 3 capsules (900 mg total) by mouth 4 (four) times daily. May go up to 3 capsules 4 times a day. Titrate up slowly.    Dispense:  360 capsule    Refill:  2    Do not place this medication, or any other prescription from our practice, on "Automatic Refill". Patient may have prescription filled one day early if pharmacy is closed on scheduled refill date.  . lubiprostone (AMITIZA) 8 MCG capsule    Sig: Take 1 capsule (8 mcg total) by mouth 2 (two) times daily with a meal. Swallow the medication whole. Do not break or chew the medication.    Dispense:  60 capsule    Refill:  2    Do not place this medication, or any other prescription from our practice, on "Automatic Refill". Patient may have prescription filled one day early if pharmacy is closed on scheduled refill date.  . Wheat Dextrin (BENEFIBER) POWD    Sig: Stir 2 teaspoons of Benefiber into 4-8 oz of any non-carbonated beverage or soft food (hot or cold) TID.    Dispense:  500 g    Refill:  PRN    Do not place this medication, or any other prescription from our practice, on "Automatic Refill". Patient may have prescription filled one day early if pharmacy is closed on scheduled refill date.    Lab-work & Procedure Ordered: Orders Placed This Encounter  Procedures  . GREATER OCCIPITAL NERVE BLOCK  . ToxASSURE Select 13 (MW), Urine  . Comprehensive metabolic panel  . C-reactive protein  . Magnesium  . Sedimentation rate  . Vitamin B12  . 25-Hydroxyvitamin D Lcms D2+D3    Imaging Ordered: None  Interventional Therapies: Scheduled:  None at this time.    Considering:  Greater occipital nerve radiofrequency ablation.   PRN  Procedures:  Left greater occipital nerve block under fluoroscopic guidance, with a without sedation.    Referral(s) or Consult(s): None at this time.  New Prescriptions   No medications on file    Medications administered during this visit: Ms. Mahdavi had no medications administered during this visit.  Requested PM Follow-up: Return in about 3 months (around 03/27/2016) for Medication Management, (3-Mo), Procedure (PRN - Patient will call).  Future Appointments Date Time Provider [place]  03/27/2016 10:40 AM [name], MD [facility] None    Primary Care Physician: Cari Caraway, MD Location: [facility] Note by: Kathlen Brunswick. Dossie Arbour, M.D, DABA, DABAPM, DABPM, DABIPP, FIPP  Pain Score Disclaimer: We use the NRS-11 scale. This is a self-reported, subjective measurement of pain severity with only modest accuracy. It is used primarily to identify changes within a particular patient. It must be understood that outpatient pain scales are significantly less accurate that those used for research, where they can be applied under ideal controlled circumstances with minimal exposure to variables. In reality, the score is likely to be a combination of pain intensity and pain affect, where pain affect describes the degree of emotional arousal or changes in action readiness caused by the sensory experience of pain. Factors such as social and work situation, setting, emotional state, anxiety levels, expectation, and prior pain experience may influence pain perception and show large inter-individual differences that may also be affected by time variables.  Patient instructions provided during this appointment: Patient Instructions   GENERAL RISKS AND COMPLICATIONS  What are the risk, side effects and possible complications? Generally speaking, most procedures are safe.  However, with any procedure there are risks, side effects, and the possibility of  complications.  The risks and complications are dependent upon the sites that are lesioned, or the type of nerve block to be performed.  The closer the procedure is to the spine, the more serious the risks are.  Great care is taken when placing the radio frequency needles, block needles or lesioning probes, but sometimes complications can occur. 1. Infection: Any time there is an injection through the skin, there is a risk of infection.  This is why sterile conditions are used for these blocks.  There are four possible types of infection. 1. Localized skin infection. 2. Central Nervous System Infection-This can be in the form of Meningitis, which can be deadly. 3. Epidural Infections-This can be in the form of an epidural abscess, which can cause pressure inside of the spine, causing compression of the spinal cord with subsequent paralysis. This would require an emergency surgery to decompress, and there are no guarantees that the patient would recover from the paralysis. 4. Discitis-This is an infection of the intervertebral discs.  It occurs in about 1% of discography procedures.  It is difficult to treat and it may lead to surgery.        2. Pain: the needles have to go through skin and soft tissues, will cause soreness.       3. Damage to internal structures:  The nerves to be lesioned may be near blood vessels or    other nerves which can be potentially damaged.       4. Bleeding: Bleeding is more common if the patient is taking blood thinners such as  aspirin, Coumadin, Ticiid, Plavix, etc., or if he/she have some genetic predisposition  such as hemophilia. Bleeding into the spinal canal can cause compression of the spinal  cord with subsequent paralysis.  This would require an emergency surgery to  decompress and there are no guarantees that the patient would recover from the  paralysis.       5. Pneumothorax:  Puncturing of a lung is a possibility, every time a needle is introduced in  the area of  the chest or upper back.  Pneumothorax refers to free air around the  collapsed lung(s), inside of the thoracic cavity (chest  cavity).  Another two possible  complications related to a similar event would include: Hemothorax and Chylothorax.   These are variations of the Pneumothorax, where instead of air around the collapsed  lung(s), you may have blood or chyle, respectively.       6. Spinal headaches: They may occur with any procedures in the area of the spine.       7. Persistent CSF (Cerebro-Spinal Fluid) leakage: This is a rare problem, but may occur  with prolonged intrathecal or epidural catheters either due to the formation of a fistulous  track or a dural tear.       8. Nerve damage: By working so close to the spinal cord, there is always a possibility of  nerve damage, which could be as serious as a permanent spinal cord injury with  paralysis.       9. Death:  Although rare, severe deadly allergic reactions known as "Anaphylactic  reaction" can occur to any of the medications used.      10. Worsening of the symptoms:  We can always make thing worse.  What are the chances of something like this happening? Chances of any of this occuring are extremely low.  By statistics, you have more of a chance of getting killed in a motor vehicle accident: while driving to the hospital than any of the above occurring .  Nevertheless, you should be aware that they are possibilities.  In general, it is similar to taking a shower.  Everybody knows that you can slip, hit your head and get killed.  Does that mean that you should not shower again?  Nevertheless always keep in mind that statistics do not mean anything if you happen to be on the wrong side of them.  Even if a procedure has a 1 (one) in a 1,000,000 (million) chance of going wrong, it you happen to be that one..Also, keep in mind that by statistics, you have more of a chance of having something go wrong when taking medications.  Who should not have  this procedure? If you are on a blood thinning medication (e.g. Coumadin, Plavix, see list of "Blood Thinners"), or if you have an active infection going on, you should not have the procedure.  If you are taking any blood thinners, please inform your physician.  How should I prepare for this procedure?  Do not eat or drink anything at least six hours prior to the procedure.  Bring a driver with you .  It cannot be a taxi.  Come accompanied by an adult that can drive you back, and that is strong enough to help you if your legs get weak or numb from the local anesthetic.  Take all of your medicines the morning of the procedure with just enough water to swallow them.  If you have diabetes, make sure that you are scheduled to have your procedure done first thing in the morning, whenever possible.  If you have diabetes, take only half of your insulin dose and notify our nurse that you have done so as soon as you arrive at the clinic.  If you are diabetic, but only take blood sugar pills (oral hypoglycemic), then do not take them on the morning of your procedure.  You may take them after you have had the procedure.  Do not take aspirin or any aspirin-containing medications, at least eleven (11) days prior to the procedure.  They may prolong bleeding.  Wear loose fitting clothing that may be easy  to take off and that you would not mind if it got stained with Betadine or blood.  Do not wear any jewelry or perfume  Remove any nail coloring.  It will interfere with some of our monitoring equipment.  NOTE: Remember that this is not meant to be interpreted as a complete list of all possible complications.  Unforeseen problems may occur.  BLOOD THINNERS The following drugs contain aspirin or other products, which can cause increased bleeding during surgery and should not be taken for 2 weeks prior to and 1 week after surgery.  If you should need take something for relief of minor pain, you may take  acetaminophen which is found in Tylenol,m Datril, Anacin-3 and Panadol. It is not blood thinner. The products listed below are.  Do not take any of the products listed below in addition to any listed on your instruction sheet.  A.P.C or A.P.C with Codeine Codeine Phosphate Capsules #3 Ibuprofen Ridaura  ABC compound Congesprin Imuran rimadil  Advil Cope Indocin Robaxisal  Alka-Seltzer Effervescent Pain Reliever and Antacid Coricidin or Coricidin-D  Indomethacin Rufen  Alka-Seltzer plus Cold Medicine Cosprin Ketoprofen S-A-C Tablets  Anacin Analgesic Tablets or Capsules Coumadin Korlgesic Salflex  Anacin Extra Strength Analgesic tablets or capsules CP-2 Tablets Lanoril Salicylate  Anaprox Cuprimine Capsules Levenox Salocol  Anexsia-D Dalteparin Magan Salsalate  Anodynos Darvon compound Magnesium Salicylate Sine-off  Ansaid Dasin Capsules Magsal Sodium Salicylate  Anturane Depen Capsules Marnal Soma  APF Arthritis pain formula Dewitt's Pills Measurin Stanback  Argesic Dia-Gesic Meclofenamic Sulfinpyrazone  Arthritis Bayer Timed Release Aspirin Diclofenac Meclomen Sulindac  Arthritis pain formula Anacin Dicumarol Medipren Supac  Analgesic (Safety coated) Arthralgen Diffunasal Mefanamic Suprofen  Arthritis Strength Bufferin Dihydrocodeine Mepro Compound Suprol  Arthropan liquid Dopirydamole Methcarbomol with Aspirin Synalgos  ASA tablets/Enseals Disalcid Micrainin Tagament  Ascriptin Doan's Midol Talwin  Ascriptin A/D Dolene Mobidin Tanderil  Ascriptin Extra Strength Dolobid Moblgesic Ticlid  Ascriptin with Codeine Doloprin or Doloprin with Codeine Momentum Tolectin  Asperbuf Duoprin Mono-gesic Trendar  Aspergum Duradyne Motrin or Motrin IB Triminicin  Aspirin plain, buffered or enteric coated Durasal Myochrisine Trigesic  Aspirin Suppositories Easprin Nalfon Trillsate  Aspirin with Codeine Ecotrin Regular or Extra Strength Naprosyn Uracel  Atromid-S Efficin Naproxen Ursinus  Auranofin  Capsules Elmiron Neocylate Vanquish  Axotal Emagrin Norgesic Verin  Azathioprine Empirin or Empirin with Codeine Normiflo Vitamin E  Azolid Emprazil Nuprin Voltaren  Bayer Aspirin plain, buffered or children's or timed BC Tablets or powders Encaprin Orgaran Warfarin Sodium  Buff-a-Comp Enoxaparin Orudis Zorpin  Buff-a-Comp with Codeine Equegesic Os-Cal-Gesic   Buffaprin Excedrin plain, buffered or Extra Strength Oxalid   Bufferin Arthritis Strength Feldene Oxphenbutazone   Bufferin plain or Extra Strength Feldene Capsules Oxycodone with Aspirin   Bufferin with Codeine Fenoprofen Fenoprofen Pabalate or Pabalate-SF   Buffets II Flogesic Panagesic   Buffinol plain or Extra Strength Florinal or Florinal with Codeine Panwarfarin   Buf-Tabs Flurbiprofen Penicillamine   Butalbital Compound Four-way cold tablets Penicillin   Butazolidin Fragmin Pepto-Bismol   Carbenicillin Geminisyn Percodan   Carna Arthritis Reliever Geopen Persantine   Carprofen Gold's salt Persistin   Chloramphenicol Goody's Phenylbutazone   Chloromycetin Haltrain Piroxlcam   Clmetidine heparin Plaquenil   Cllnoril Hyco-pap Ponstel   Clofibrate Hydroxy chloroquine Propoxyphen         Before stopping any of these medications, be sure to consult the physician who ordered them.  Some, such as Coumadin (Warfarin) are ordered to prevent or treat serious conditions such as "deep thrombosis", "  pumonary embolisms", and other heart problems.  The amount of time that you may need off of the medication may also vary with the medication and the reason for which you were taking it.  If you are taking any of these medications, please make sure you notify your pain physician before you undergo any procedures.         Occipital Nerve Block Patient Information  Description: The occipital nerves originate in the cervical (neck) spinal cord and travel upward through muscle and tissue to supply sensation to the back of the head and top  of the scalp.  In addition, the nerves control some of the muscles of the scalp.  Occipital neuralgia is an irritation of these nerves which can cause headaches, numbness of the scalp, and neck discomfort.     The occipital nerve block will interrupt nerve transmission through these nerves and can relieve pain and spasm.  The block consists of insertion of a small needle under the skin in the back of the head to deposit local anesthetic (numbing medicine) and/or steroids around the nerve.  The entire block usually lasts less than 5 minutes.  Conditions which may be treated by occipital blocks:   Muscular pain and spasm of the scalp  Nerve irritation, back of the head  Headaches  Upper neck pain  Preparation for the injection:  12. Do not eat any solid food or dairy products within 8 hours of your appointment. 13. You may drink clear liquids up to 3 hours before appointment.  Clear liquids include water, black coffee, juice or soda.  No milk or cream please. 14. You may take your regular medication, including pain medications, with a sip of water before you appointment.  Diabetics should hold regular insulin (if taken separately) and take 1/2 normal NPH dose the morning of the procedure.  Carry some sugar containing items with you to your appointment. 15. A driver must accompany you and be prepared to drive you home after your procedure. 24. Bring all your current medications with you. 17. An IV may be inserted and sedation may be given at the discretion of the physician. 18. A blood pressure cuff, EKG, and other monitors will often be applied during the procedure.  Some patients may need to have extra oxygen administered for a short period. 14. You will be asked to provide medical information, including your allergies and medications, prior to the procedure.  We must know immediately if you are taking blood thinners (like Coumadin/Warfarin) or if you are allergic to IV iodine contrast (dye).   We must know if you could possible be pregnant.  20. Do not wear a high collared shirt or turtleneck.  Tie long hair up in the back if possible.  Possible side-effects:   Bleeding from needle site  Infection (rare, may require surgery)  Nerve injury (rare)  Hair on back of neck can be tinged with iodine scrub (this will wash out)  Light-headedness (temporary)  Pain at injection site (several days)  Decreased blood pressure (rare, temporary)  Seizure (very rare)  Call if you experience:   Hives or difficulty breathing ( go to the emergency room)  Inflammation or drainage at the injection site(s)  Please note:  Although the local anesthetic injected can often make your painful muscles or headache feel good for several hours after the injection, the pain may return.  It takes 3-7 days for steroids to work.  You may not notice any pain relief for at least  one week.  If effective, we will often do a series of injections spaced 3-6 weeks apart to maximally decrease your pain.  If you have any questions, please call [phone] [facility]

## 2016-01-04 NOTE — Patient Instructions (Signed)
GENERAL RISKS AND COMPLICATIONS  What are the risk, side effects and possible complications? Generally speaking, most procedures are safe.  However, with any procedure there are risks, side effects, and the possibility of complications.  The risks and complications are dependent upon the sites that are lesioned, or the type of nerve block to be performed.  The closer the procedure is to the spine, the more serious the risks are.  Great care is taken when placing the radio frequency needles, block needles or lesioning probes, but sometimes complications can occur. 1. Infection: Any time there is an injection through the skin, there is a risk of infection.  This is why sterile conditions are used for these blocks.  There are four possible types of infection. 1. Localized skin infection. 2. Central Nervous System Infection-This can be in the form of Meningitis, which can be deadly. 3. Epidural Infections-This can be in the form of an epidural abscess, which can cause pressure inside of the spine, causing compression of the spinal cord with subsequent paralysis. This would require an emergency surgery to decompress, and there are no guarantees that the patient would recover from the paralysis. 4. Discitis-This is an infection of the intervertebral discs.  It occurs in about 1% of discography procedures.  It is difficult to treat and it may lead to surgery.        2. Pain: the needles have to go through skin and soft tissues, will cause soreness.       3. Damage to internal structures:  The nerves to be lesioned may be near blood vessels or    other nerves which can be potentially damaged.       4. Bleeding: Bleeding is more common if the patient is taking blood thinners such as  aspirin, Coumadin, Ticiid, Plavix, etc., or if he/she have some genetic predisposition  such as hemophilia. Bleeding into the spinal canal can cause compression of the spinal  cord with subsequent paralysis.  This would require an  emergency surgery to  decompress and there are no guarantees that the patient would recover from the  paralysis.       5. Pneumothorax:  Puncturing of a lung is a possibility, every time a needle is introduced in  the area of the chest or upper back.  Pneumothorax refers to free air around the  collapsed lung(s), inside of the thoracic cavity (chest cavity).  Another two possible  complications related to a similar event would include: Hemothorax and Chylothorax.   These are variations of the Pneumothorax, where instead of air around the collapsed  lung(s), you may have blood or chyle, respectively.       6. Spinal headaches: They may occur with any procedures in the area of the spine.       7. Persistent CSF (Cerebro-Spinal Fluid) leakage: This is a rare problem, but may occur  with prolonged intrathecal or epidural catheters either due to the formation of a fistulous  track or a dural tear.       8. Nerve damage: By working so close to the spinal cord, there is always a possibility of  nerve damage, which could be as serious as a permanent spinal cord injury with  paralysis.       9. Death:  Although rare, severe deadly allergic reactions known as "Anaphylactic  reaction" can occur to any of the medications used.      10. Worsening of the symptoms:  We can always make thing worse.    What are the chances of something like this happening? Chances of any of this occuring are extremely low.  By statistics, you have more of a chance of getting killed in a motor vehicle accident: while driving to the hospital than any of the above occurring .  Nevertheless, you should be aware that they are possibilities.  In general, it is similar to taking a shower.  Everybody knows that you can slip, hit your head and get killed.  Does that mean that you should not shower again?  Nevertheless always keep in mind that statistics do not mean anything if you happen to be on the wrong side of them.  Even if a procedure has a 1  (one) in a 1,000,000 (million) chance of going wrong, it you happen to be that one..Also, keep in mind that by statistics, you have more of a chance of having something go wrong when taking medications.  Who should not have this procedure? If you are on a blood thinning medication (e.g. Coumadin, Plavix, see list of "Blood Thinners"), or if you have an active infection going on, you should not have the procedure.  If you are taking any blood thinners, please inform your physician.  How should I prepare for this procedure?  Do not eat or drink anything at least six hours prior to the procedure.  Bring a driver with you .  It cannot be a taxi.  Come accompanied by an adult that can drive you back, and that is strong enough to help you if your legs get weak or numb from the local anesthetic.  Take all of your medicines the morning of the procedure with just enough water to swallow them.  If you have diabetes, make sure that you are scheduled to have your procedure done first thing in the morning, whenever possible.  If you have diabetes, take only half of your insulin dose and notify our nurse that you have done so as soon as you arrive at the clinic.  If you are diabetic, but only take blood sugar pills (oral hypoglycemic), then do not take them on the morning of your procedure.  You may take them after you have had the procedure.  Do not take aspirin or any aspirin-containing medications, at least eleven (11) days prior to the procedure.  They may prolong bleeding.  Wear loose fitting clothing that may be easy to take off and that you would not mind if it got stained with Betadine or blood.  Do not wear any jewelry or perfume  Remove any nail coloring.  It will interfere with some of our monitoring equipment.  NOTE: Remember that this is not meant to be interpreted as a complete list of all possible complications.  Unforeseen problems may occur.  BLOOD THINNERS The following drugs  contain aspirin or other products, which can cause increased bleeding during surgery and should not be taken for 2 weeks prior to and 1 week after surgery.  If you should need take something for relief of minor pain, you may take acetaminophen which is found in Tylenol,m Datril, Anacin-3 and Panadol. It is not blood thinner. The products listed below are.  Do not take any of the products listed below in addition to any listed on your instruction sheet.  A.P.C or A.P.C with Codeine Codeine Phosphate Capsules #3 Ibuprofen Ridaura  ABC compound Congesprin Imuran rimadil  Advil Cope Indocin Robaxisal  Alka-Seltzer Effervescent Pain Reliever and Antacid Coricidin or Coricidin-D  Indomethacin Rufen    Alka-Seltzer plus Cold Medicine Cosprin Ketoprofen S-A-C Tablets  Anacin Analgesic Tablets or Capsules Coumadin Korlgesic Salflex  Anacin Extra Strength Analgesic tablets or capsules CP-2 Tablets Lanoril Salicylate  Anaprox Cuprimine Capsules Levenox Salocol  Anexsia-D Dalteparin Magan Salsalate  Anodynos Darvon compound Magnesium Salicylate Sine-off  Ansaid Dasin Capsules Magsal Sodium Salicylate  Anturane Depen Capsules Marnal Soma  APF Arthritis pain formula Dewitt's Pills Measurin Stanback  Argesic Dia-Gesic Meclofenamic Sulfinpyrazone  Arthritis Bayer Timed Release Aspirin Diclofenac Meclomen Sulindac  Arthritis pain formula Anacin Dicumarol Medipren Supac  Analgesic (Safety coated) Arthralgen Diffunasal Mefanamic Suprofen  Arthritis Strength Bufferin Dihydrocodeine Mepro Compound Suprol  Arthropan liquid Dopirydamole Methcarbomol with Aspirin Synalgos  ASA tablets/Enseals Disalcid Micrainin Tagament  Ascriptin Doan's Midol Talwin  Ascriptin A/D Dolene Mobidin Tanderil  Ascriptin Extra Strength Dolobid Moblgesic Ticlid  Ascriptin with Codeine Doloprin or Doloprin with Codeine Momentum Tolectin  Asperbuf Duoprin Mono-gesic Trendar  Aspergum Duradyne Motrin or Motrin IB Triminicin  Aspirin  plain, buffered or enteric coated Durasal Myochrisine Trigesic  Aspirin Suppositories Easprin Nalfon Trillsate  Aspirin with Codeine Ecotrin Regular or Extra Strength Naprosyn Uracel  Atromid-S Efficin Naproxen Ursinus  Auranofin Capsules Elmiron Neocylate Vanquish  Axotal Emagrin Norgesic Verin  Azathioprine Empirin or Empirin with Codeine Normiflo Vitamin E  Azolid Emprazil Nuprin Voltaren  Bayer Aspirin plain, buffered or children's or timed BC Tablets or powders Encaprin Orgaran Warfarin Sodium  Buff-a-Comp Enoxaparin Orudis Zorpin  Buff-a-Comp with Codeine Equegesic Os-Cal-Gesic   Buffaprin Excedrin plain, buffered or Extra Strength Oxalid   Bufferin Arthritis Strength Feldene Oxphenbutazone   Bufferin plain or Extra Strength Feldene Capsules Oxycodone with Aspirin   Bufferin with Codeine Fenoprofen Fenoprofen Pabalate or Pabalate-SF   Buffets II Flogesic Panagesic   Buffinol plain or Extra Strength Florinal or Florinal with Codeine Panwarfarin   Buf-Tabs Flurbiprofen Penicillamine   Butalbital Compound Four-way cold tablets Penicillin   Butazolidin Fragmin Pepto-Bismol   Carbenicillin Geminisyn Percodan   Carna Arthritis Reliever Geopen Persantine   Carprofen Gold's salt Persistin   Chloramphenicol Goody's Phenylbutazone   Chloromycetin Haltrain Piroxlcam   Clmetidine heparin Plaquenil   Cllnoril Hyco-pap Ponstel   Clofibrate Hydroxy chloroquine Propoxyphen         Before stopping any of these medications, be sure to consult the physician who ordered them.  Some, such as Coumadin (Warfarin) are ordered to prevent or treat serious conditions such as "deep thrombosis", "pumonary embolisms", and other heart problems.  The amount of time that you may need off of the medication may also vary with the medication and the reason for which you were taking it.  If you are taking any of these medications, please make sure you notify your pain physician before you undergo any  procedures.         Occipital Nerve Block Patient Information  Description: The occipital nerves originate in the cervical (neck) spinal cord and travel upward through muscle and tissue to supply sensation to the back of the head and top of the scalp.  In addition, the nerves control some of the muscles of the scalp.  Occipital neuralgia is an irritation of these nerves which can cause headaches, numbness of the scalp, and neck discomfort.     The occipital nerve block will interrupt nerve transmission through these nerves and can relieve pain and spasm.  The block consists of insertion of a small needle under the skin in the back of the head to deposit local anesthetic (numbing medicine) and/or steroids around the nerve.  The   entire block usually lasts less than 5 minutes.  Conditions which may be treated by occipital blocks:   Muscular pain and spasm of the scalp  Nerve irritation, back of the head  Headaches  Upper neck pain  Preparation for the injection:  12. Do not eat any solid food or dairy products within 8 hours of your appointment. 13. You may drink clear liquids up to 3 hours before appointment.  Clear liquids include water, black coffee, juice or soda.  No milk or cream please. 14. You may take your regular medication, including pain medications, with a sip of water before you appointment.  Diabetics should hold regular insulin (if taken separately) and take 1/2 normal NPH dose the morning of the procedure.  Carry some sugar containing items with you to your appointment. 15. A driver must accompany you and be prepared to drive you home after your procedure. 16. Bring all your current medications with you. 17. An IV may be inserted and sedation may be given at the discretion of the physician. 18. A blood pressure cuff, EKG, and other monitors will often be applied during the procedure.  Some patients may need to have extra oxygen administered for a short period. 19. You  will be asked to provide medical information, including your allergies and medications, prior to the procedure.  We must know immediately if you are taking blood thinners (like Coumadin/Warfarin) or if you are allergic to IV iodine contrast (dye).  We must know if you could possible be pregnant.  20. Do not wear a high collared shirt or turtleneck.  Tie long hair up in the back if possible.  Possible side-effects:   Bleeding from needle site  Infection (rare, may require surgery)  Nerve injury (rare)  Hair on back of neck can be tinged with iodine scrub (this will wash out)  Light-headedness (temporary)  Pain at injection site (several days)  Decreased blood pressure (rare, temporary)  Seizure (very rare)  Call if you experience:   Hives or difficulty breathing ( go to the emergency room)  Inflammation or drainage at the injection site(s)  Please note:  Although the local anesthetic injected can often make your painful muscles or headache feel good for several hours after the injection, the pain may return.  It takes 3-7 days for steroids to work.  You may not notice any pain relief for at least one week.  If effective, we will often do a series of injections spaced 3-6 weeks apart to maximally decrease your pain.  If you have any questions, please call [phone] [facility]  

## 2016-01-07 LAB — 25-HYDROXY VITAMIN D LCMS D2+D3
25-Hydroxy, Vitamin D-2: 25 ng/mL
25-Hydroxy, Vitamin D-3: 24 ng/mL
25-Hydroxy, Vitamin D: 49 ng/mL

## 2016-01-13 LAB — TOXASSURE SELECT 13 (MW), URINE

## 2016-01-15 NOTE — Progress Notes (Signed)
Quick Note:   Normal chloride levels are between 95 and 107 mEq/L. Low levels may be due to: Addison disease; Bartter syndrome; burns; congestive heart failure; dehydration; excessive sweating; hyperaldosteronism; metabolic alkalosis; respiratory acidosis (compensated); Syndrome of inappropriate diuretic hormone secretion (SIADH); or vomiting.  Most of the CO2 in the body is in the form of bicarbonate (HCO3-). Therefore, the CO2 blood test is really a measure of bicarbonate levels. kidneys help maintain the normal bicarbonate levels. The normal range is between 22 and 28 mEq/L, for our Lab. Higher levels may suggest alkalosis; renal tubular acidosis; breathing disorders; cushing syndrome; and hyperaldosteronism among others.  ______

## 2016-01-16 DIAGNOSIS — Z0279 Encounter for issue of other medical certificate: Secondary | ICD-10-CM | POA: Diagnosis not present

## 2016-01-18 ENCOUNTER — Ambulatory Visit (INDEPENDENT_AMBULATORY_CARE_PROVIDER_SITE_OTHER): Payer: BLUE CROSS/BLUE SHIELD | Admitting: Psychology

## 2016-01-18 DIAGNOSIS — G894 Chronic pain syndrome: Secondary | ICD-10-CM | POA: Diagnosis not present

## 2016-01-18 DIAGNOSIS — F411 Generalized anxiety disorder: Secondary | ICD-10-CM | POA: Diagnosis not present

## 2016-01-25 ENCOUNTER — Ambulatory Visit: Payer: 59 | Admitting: Psychology

## 2016-01-29 ENCOUNTER — Telehealth: Payer: Self-pay | Admitting: *Deleted

## 2016-01-29 NOTE — Telephone Encounter (Signed)
Left message

## 2016-01-29 NOTE — Telephone Encounter (Signed)
Pt lvm stating that [employer] will not cover her med that was prescribed to her by the doctor. Please call the pt...thanks

## 2016-02-01 ENCOUNTER — Ambulatory Visit: Payer: BLUE CROSS/BLUE SHIELD | Admitting: Psychology

## 2016-02-08 ENCOUNTER — Ambulatory Visit (INDEPENDENT_AMBULATORY_CARE_PROVIDER_SITE_OTHER): Payer: BLUE CROSS/BLUE SHIELD | Admitting: Psychology

## 2016-02-08 DIAGNOSIS — F411 Generalized anxiety disorder: Secondary | ICD-10-CM | POA: Diagnosis not present

## 2016-02-08 DIAGNOSIS — G894 Chronic pain syndrome: Secondary | ICD-10-CM | POA: Diagnosis not present

## 2016-02-22 ENCOUNTER — Ambulatory Visit (INDEPENDENT_AMBULATORY_CARE_PROVIDER_SITE_OTHER): Payer: BLUE CROSS/BLUE SHIELD | Admitting: Psychology

## 2016-02-22 DIAGNOSIS — G894 Chronic pain syndrome: Secondary | ICD-10-CM

## 2016-02-22 DIAGNOSIS — F411 Generalized anxiety disorder: Secondary | ICD-10-CM | POA: Diagnosis not present

## 2016-02-29 ENCOUNTER — Ambulatory Visit: Payer: BLUE CROSS/BLUE SHIELD | Admitting: Psychology

## 2016-03-07 ENCOUNTER — Ambulatory Visit: Payer: BLUE CROSS/BLUE SHIELD | Admitting: Psychology

## 2016-03-19 ENCOUNTER — Ambulatory Visit: Payer: BLUE CROSS/BLUE SHIELD | Admitting: Psychology

## 2016-03-26 ENCOUNTER — Ambulatory Visit: Payer: BLUE CROSS/BLUE SHIELD | Admitting: Psychology

## 2016-03-27 ENCOUNTER — Ambulatory Visit
Admission: RE | Admit: 2016-03-27 | Discharge: 2016-03-27 | Disposition: A | Discharge status: Home / Self Care | Payer: BLUE CROSS/BLUE SHIELD | Source: Ambulatory Visit | Attending: Pain Medicine | Admitting: Pain Medicine

## 2016-03-27 ENCOUNTER — Encounter: Payer: Self-pay | Admitting: Pain Medicine

## 2016-03-27 ENCOUNTER — Ambulatory Visit (HOSPITAL_BASED_OUTPATIENT_CLINIC_OR_DEPARTMENT_OTHER): Payer: BLUE CROSS/BLUE SHIELD | Admitting: Pain Medicine

## 2016-03-27 VITALS — BP 131/60 | HR 74 | Temp 97.7°F | Resp 18 | Ht 66.0 in | Wt 160.0 lb

## 2016-03-27 DIAGNOSIS — M50323 Other cervical disc degeneration at C6-C7 level: Secondary | ICD-10-CM | POA: Diagnosis not present

## 2016-03-27 DIAGNOSIS — G8929 Other chronic pain: Secondary | ICD-10-CM

## 2016-03-27 DIAGNOSIS — M50321 Other cervical disc degeneration at C4-C5 level: Secondary | ICD-10-CM | POA: Diagnosis not present

## 2016-03-27 DIAGNOSIS — M47812 Spondylosis without myelopathy or radiculopathy, cervical region: Secondary | ICD-10-CM

## 2016-03-27 DIAGNOSIS — T402X5A Adverse effect of other opioids, initial encounter: Secondary | ICD-10-CM

## 2016-03-27 DIAGNOSIS — M5382 Other specified dorsopathies, cervical region: Secondary | ICD-10-CM | POA: Insufficient documentation

## 2016-03-27 DIAGNOSIS — M542 Cervicalgia: Secondary | ICD-10-CM

## 2016-03-27 DIAGNOSIS — Z79891 Long term (current) use of opiate analgesic: Secondary | ICD-10-CM | POA: Diagnosis not present

## 2016-03-27 DIAGNOSIS — M792 Neuralgia and neuritis, unspecified: Secondary | ICD-10-CM

## 2016-03-27 DIAGNOSIS — R109 Unspecified abdominal pain: Secondary | ICD-10-CM

## 2016-03-27 DIAGNOSIS — F1190 Opioid use, unspecified, uncomplicated: Secondary | ICD-10-CM

## 2016-03-27 DIAGNOSIS — K5903 Drug induced constipation: Secondary | ICD-10-CM | POA: Diagnosis not present

## 2016-03-27 DIAGNOSIS — M50322 Other cervical disc degeneration at C5-C6 level: Secondary | ICD-10-CM | POA: Insufficient documentation

## 2016-03-27 DIAGNOSIS — R1012 Left upper quadrant pain: Secondary | ICD-10-CM

## 2016-03-27 MED ORDER — LUBIPROSTONE 8 MCG PO CAPS: 8.0000 ug | ORAL_CAPSULE | Freq: Two times a day (BID) | ORAL | 2 refills | Status: DC

## 2016-03-27 MED ORDER — DOCUSATE SODIUM 100 MG PO CAPS: 200.0000 mg | ORAL_CAPSULE | Freq: Every evening | ORAL | 99 refills | Status: DC | PRN

## 2016-03-27 MED ORDER — MORPHINE SULFATE ER 30 MG PO TBCR: 30.0000 mg | EXTENDED_RELEASE_TABLET | Freq: Two times a day (BID) | ORAL | 0 refills | Status: DC

## 2016-03-27 MED ORDER — GABAPENTIN 300 MG PO CAPS: 900.0000 mg | ORAL_CAPSULE | Freq: Four times a day (QID) | ORAL | 2 refills | Status: DC

## 2016-03-27 MED ORDER — BISACODYL 5 MG PO TBEC: 10.0000 mg | DELAYED_RELEASE_TABLET | Freq: Every evening | ORAL | 99 refills | Status: DC | PRN

## 2016-03-27 MED ORDER — NALOXONE HCL 2 MG/2ML IJ SOSY: PREFILLED_SYRINGE | INTRAMUSCULAR | 1 refills | Status: DC

## 2016-03-27 MED ORDER — BENEFIBER PO POWD: ORAL | 99 refills | Status: DC

## 2016-03-27 NOTE — Progress Notes (Signed)
Results were reviewed and found to be: within normal limits    Review would suggest the patient to be a possible candidate for interventional pain management options. The patient may benefit from diagnostic Cervical facet blocks under fluoroscopic guidance and IV sedation.  No acute injury or pathology identified

## 2016-03-27 NOTE — Patient Instructions (Signed)
Naloxone nasal spray What is this medicine? NALOXONE (nal OX one) is a narcotic blocker. It is used to treat narcotic drug overdose. This medicine may be used for other purposes; ask your health care provider or pharmacist if you have questions. What should I tell my health care provider before I take this medicine? They need to know if you have any of these conditions: -drug abuse or addiction -heart disease -an unusual or allergic reaction to naloxone, other medicines, foods, dyes, or preservatives -pregnant or trying to get pregnant -breast-feeding How should I use this medicine? This medicine is for use in the nose. Lay the person on their back. Support their neck with your hand and allow the head to tilt back before giving the medicine. The nasal spray should be given into 1 nostril. After giving the medicine, move the person onto their side. Do not remove or test the nasal spray until ready to use. Get emergency medical help right away after giving the first dose of this medicine, even if the person wakes up. You should be familiar with how to recognize the signs and symptoms of a narcotic overdose. If more doses are needed, give the additional dose in the other nostril. Talk to your pediatrician regarding the use of this medicine in children. While this drug may be prescribed for children as young as newborns for selected conditions, precautions do apply. Overdosage: If you think you have taken too much of this medicine contact a poison control center or emergency room at once. NOTE: This medicine is only for you. Do not share this medicine with others. What if I miss a dose? This does not apply. What may interact with this medicine? This medicine is only used during an emergency. No interactions are expected during emergency use. This list may not describe all possible interactions. Give your health care provider a list of all the medicines, herbs, non-prescription drugs, or dietary  supplements you use. Also tell them if you smoke, drink alcohol, or use illegal drugs. Some items may interact with your medicine. What should I watch for while using this medicine? Keep this medicine ready for use in the case of a narcotic overdose. Make sure that you have the phone number of your doctor or health care professional and local hospital ready. You may need to have additional doses of this medicine. Each nasal spray contains a single dose. Some emergencies may require additional doses. After use, bring the treated person to the nearest hospital or call 911. Make sure the treating health care professional knows that the person has received an injection of this medicine. You will receive additional instructions on what to do during and after use of this medicine before an emergency occurs. What side effects may I notice from receiving this medicine? Side effects that you should report to your doctor or health care professional as soon as possible: -allergic reactions like skin rash, itching or hives, swelling of the face, lips, or tongue -breathing problems -fast, irregular heartbeat -high blood pressure -pain that was controlled by narcotic pain medicine -seizures Side effects that usually do not require medical attention (report these to your doctor or health care professional if they continue or are bothersome): -anxious -chills -diarrhea -fever -headache -muscle pain -nausea, vomiting -nose irritation like dryness, congestion, and swelling -sweating This list may not describe all possible side effects. Call your doctor for medical advice about side effects. You may report side effects to FDA at 1-800-FDA-1088. Where should I keep my   medicine? Keep out of the reach of children. Store between 4 and 40 degrees C (39 and 104 degrees F). Do not freeze. Throw away any unused medicine after the expiration date. Keep in original box until ready to use. NOTE: This sheet is a summary.  It may not cover all possible information. If you have questions about this medicine, talk to your doctor, pharmacist, or health care provider.    2016, [employer]. (2014-07-15 14:24:03)  

## 2016-03-27 NOTE — Progress Notes (Signed)
Patient's Name: Kari Gibbs  Patient type: Established  MRN: [id number]  Service setting: Ambulatory outpatient  DOB: 1950/12/11  Location: [facility]  DOS: 03/27/2016  Primary Care Physician: Cari Caraway, MD  Note by: Kathlen Brunswick. Dossie Arbour, M.D  Referring Physician: Cari Caraway, MD  Specialty: Interventional Pain Management  Last Visit to Pain Management: 01/29/2016   Primary Reason(s) for Visit: Encounter for prescription drug management (Level of risk: moderate) CC: Abdominal Pain (left side); Neck Pain; and Shoulder Pain   HPI  Kari Gibbs is a 65 y.o. year old, female patient, who returns today as an established patient. 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; S/P laparoscopic cholecystectomy Oct 2014; Fracture of rib of left side; Varicose veins of leg with complications; Chronic pain; Long term current use of opiate analgesic; Long term prescription opiate use; Opiate use (60 MME/day); Encounter for therapeutic drug level monitoring; Opiate dependence ([place]); Encounter for chronic pain management; Chronic pain syndrome; Chronic flank pain (Location of Primary Source of Pain) (Left); Chronic thoracic radicular pain (Left-sided); Opioid-induced constipation (OIC); Occipital neuralgia of left side; Neuropathic pain; Neurogenic pain; History of rib fracture (Left 4th and 8th rib); Vertebral body hemangioma (T11); Metatarsalgia of right foot; Chronic abdominal pain (Bilateral) (L>R) (possible left-sided thoracic radiculopathy); Osteoarthritis; Return to work evaluation (FCE: Medium); Varicose veins of bilateral lower extremities with other complications; Varicose veins of right lower extremity with complications; Chronic neck pain (B) (L>R); and Cervical facet syndrome (B) (L>R) on her problem list.. Her primarily concern today is the Abdominal Pain (left side);  Neck Pain; and Shoulder Pain   Pain Assessment: Self-Reported Pain Score: 4              Reported level is compatible with observation       Pain Type: Chronic pain Pain Location: Abdomen (neck and shoulders) Pain Orientation: Left Pain Descriptors / Indicators: Aching Pain Frequency: Constant  The patient comes into the clinics today for pharmacological management of her chronic pain. I last saw this patient on 01/04/2016. The patient  reports that she does not use drugs. Her body mass index is 25.82 kg/m. Having more neck and shoulder pain with "cracking and popping" of her neck with lateral rotation. We'll order some X-rays of the C-spine and physical therapy. I will f/u in 6-7 weeks. Amitiza was not approved by her insurance company. She does admit that it works well, but unfortunately it is expensive. Because of this we will be switching her to a less effective, and less expensive alternative.  Date of Last Visit: 01/04/16 Service Provided on Last Visit: Med Refill  Controlled Substance Pharmacotherapy Assessment & REMS (Risk Evaluation and Mitigation Strategy)  Analgesic: MS Contin 30 mg every 12 hours (60 mg/day) MME/day: 60 mg/day Pill Count: Bottle labeled Morphine 30 mg # 17/60  Filled 03-06-16. Pharmacokinetics: Onset of action (Liberation/Absorption): Within expected pharmacological parameters Time to Peak effect (Distribution): Timing and results are as within normal expected parameters Duration of action (Metabolism/Excretion): Within normal limits for medication Pharmacodynamics: Analgesic Effect: More than 50% Activity Facilitation: Medication(s) allow patient to sit, stand, walk, and do the basic ADLs Perceived Effectiveness: Described as relatively effective, allowing for increase in activities of daily living (ADL) Side-effects or Adverse reactions: None reported Monitoring: [place] PMP: Online review of the past 26-month period conducted. Compliant with practice rules and  regulations Last UDS on record: ToxAssure Select 13  Date  Value Ref Range Status  01/04/2016 FINAL  Final    Comment:    ==================================================================== TOXASSURE SELECT 13 (MW) ==================================================================== Test                             Result       Flag       Units Drug Present and Declared for Prescription Verification   Morphine                       10334        EXPECTED   ng/mg creat    Potential sources of large amounts of morphine in the absence of    codeine include administration of morphine or use of heroin.   Hydromorphone                  213          EXPECTED   ng/mg creat    Hydromorphone may be present as a metabolite of morphine;    concentrations of hydromorphone rarely exceed 5% of the morphine    concentration when this is the source of hydromorphone. ==================================================================== Test                      Result    Flag   Units      Ref Range   Creatinine              90               mg/dL      >=20 ==================================================================== Declared Medications:  The flagging and interpretation on this report are based on the  following declared medications.  Unexpected results may arise from  inaccuracies in the declared medications.  **Note: The testing scope of this panel includes these medications:  Morphine  **Note: The testing scope of this panel does not include following  reported medications:  Chlorpheniramine  Doxycycline  Gabapentin  Iron (Ferrous Sulfate)  Lubiprostone  Magnesium  Metronidazole  Multivitamin (Prenatal Vitamin)  Naloxone  Omeprazole  Potassium  Supplement  Supplement (Probiotic)  Teriparatide  Triamcinolone acetonide  Valsartan  Venlafaxine  Vitamin D2 (Ergocalciferol) ==================================================================== For clinical consultation, please call  [phone]. ====================================================================    UDS interpretation: Compliant          Medication Assessment Form: Reviewed. Patient indicates being compliant with therapy Treatment compliance: Compliant Risk Assessment: Aberrant Behavior: None observed today Substance Use Disorder (SUD) Risk Level: No change since last visit Risk of opioid abuse or dependence: 0.7-3.0% with doses ? 36 MME/day and 6.1-26% with doses ? 120 MME/day. Opioid Risk Tool (ORT) Score: Total Score: 1 Low Risk for SUD (Score <3) Depression Scale Score: PHQ-2: PHQ-2 Total Score: 0 No depression (0) PHQ-9: PHQ-9 Total Score: 0 No depression (0-4)  Pharmacologic Plan: No change in therapy, at this time  Laboratory Chemistry  Inflammation Markers Lab Results  Component Value Date   ESRSEDRATE 17 01/04/2016   CRP <0.5 01/04/2016    Renal Function Lab Results  Component Value Date   BUN 20 01/04/2016   CREATININE 0.76 01/04/2016   GFRAA >60 01/04/2016   GFRNONAA >60 01/04/2016    Hepatic Function Lab Results  Component Value Date   AST 33 01/04/2016   ALT 26 01/04/2016   ALBUMIN 4.2 01/04/2016    Electrolytes Lab Results  Component Value Date   NA 139 01/04/2016  K 4.6 01/04/2016   CL 99 (L) 01/04/2016   CALCIUM 9.7 01/04/2016   MG 2.1 01/04/2016    Pain Modulating Vitamins Lab Results  Component Value Date   25OHVITD1 49 01/04/2016   25OHVITD2 25 01/04/2016   25OHVITD3 24 01/04/2016   VITAMINB12 321 01/04/2016    Coagulation Parameters Lab Results  Component Value Date   PLT 238 11/18/2014    Cardiovascular Lab Results  Component Value Date   HGB 11.1 (L) 11/18/2014   HCT 34.1 (L) 11/18/2014    Note: Lab results reviewed.  Recent Diagnostic Imaging  No results found.  Meds  The patient has a current medication list which includes the following prescription(s): chlorpheniramine maleate, doxycycline, ferrous sulfate, magnesium,  omeprazole, potassium gluconate, premarin, prenatal vit-fe fum-fa-omega, align, teriparatide (recombinant), UNABLE TO FIND, valsartan, venlafaxine xr, vitamin d (ergocalciferol), bisacodyl, docusate sodium, gabapentin, lubiprostone, morphine, morphine, morphine, naloxone, and benefiber.  Current Outpatient Prescriptions on File Prior to Visit  Medication Sig  . CHLORPHENIRAMINE MALEATE PO Take 4 mg by mouth every 6 (six) hours as needed.  . ferrous sulfate 325 (65 FE) MG tablet Take 325 mg by mouth daily with breakfast.  . Magnesium 500 MG TABS Take 1 tablet by mouth 2 (two) times daily.  Marland Kitchen omeprazole (PRILOSEC) 20 MG capsule Take 20 mg by mouth 2 (two) times daily.  . potassium gluconate 595 MG TABS tablet Take 595 mg by mouth daily as needed (low potassium).  Marland Kitchen PRENATAL VIT-FE FUM-FA-OMEGA PO Take 1 tablet by mouth daily.  . Probiotic Product (ALIGN) 4 MG CAPS Take 1 capsule by mouth at bedtime. Per Pt.  . Teriparatide, Recombinant, (FORTEO) 600 MCG/2.4ML SOLN Inject 20 mcg into the skin daily.  Marland Kitchen UNABLE TO FIND Take 1 tablet by mouth daily as needed (nausea). Med Name: [facility] [address] Pepogest (Peppermint oil)  . valsartan (DIOVAN) 40 MG tablet Take 40 mg by mouth every morning.  . venlafaxine XR (EFFEXOR-XR) 75 MG 24 hr capsule Take 225 mg by mouth every morning.  . Vitamin D, Ergocalciferol, (DRISDOL) 50000 units CAPS capsule 50,000 Units every 7 (seven) days.    No current facility-administered medications on file prior to visit.     ROS  Constitutional: Denies any fever or chills Gastrointestinal: No reported hemesis, hematochezia, vomiting, or acute GI distress Musculoskeletal: Denies any acute onset joint swelling, redness, loss of ROM, or weakness Neurological: No reported episodes of acute onset apraxia, aphasia, dysarthria, agnosia, amnesia, paralysis, loss of coordination, or loss of consciousness  Allergies  Ms. Russon is allergic to iohexol; ace inhibitors; losartan  potassium; and neosporin [neomycin-bacitracin zn-polymyx].  [place]  Medical:  Ms. 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 Family: family history includes Alzheimer's disease in her father; Hypertension in her mother. 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 Tobacco:  reports that she has never smoked. She has never used smokeless tobacco. Alcohol:  reports that she does not drink alcohol. Drug:  reports that she does not use drugs.  Constitutional Exam  Vitals: Blood pressure 131/60, pulse 74, temperature 97.7 F (36.5 C), resp. rate 18, height 5\' 6"  (1.676 m), weight 160 lb (72.6 kg), SpO2 100 %. General appearance: Well nourished, well developed, and well hydrated. In no acute distress Calculated BMI/Body habitus: Body mass index is 25.82 kg/m. (25-29.9 kg/m2) Overweight - 20% higher incidence of chronic pain Psych/Mental status: Alert and oriented x 3 (person, place, & time) Eyes: PERLA Respiratory: No evidence of acute respiratory distress  Cervical Spine Exam  Inspection: No masses, redness, or swelling Alignment: Symmetrical Functional ROM: Diminished ROM Stability: No instability detected Muscle strength & Tone: Functionally intact Sensory: Movement-associated pain Palpation: Complains of area  being tender to palpation  Upper Extremity (UE) Exam    Side: Right upper extremity  Side: Left upper extremity  Inspection: No masses, redness, swelling, or asymmetry  Inspection: No masses, redness, swelling, or asymmetry  Functional ROM: ROM appears unrestricted  Functional ROM: ROM appears unrestricted  Muscle strength & Tone: Functionally intact  Muscle strength & Tone: Functionally intact  Sensory: Unimpaired  Sensory: Unimpaired  Palpation: Non-contributory  Palpation: Non-contributory   Thoracic Spine Exam  Inspection: No masses, redness, or swelling Alignment: Symmetrical Functional ROM: ROM appears unrestricted Stability: No instability detected Sensory: Unimpaired Muscle strength & Tone: Functionally intact Palpation: Non-contributory  Lumbar Spine Exam  Inspection: No masses, redness, or swelling Alignment: Symmetrical Functional ROM: ROM appears unrestricted Stability: No instability detected Muscle strength & Tone: Functionally intact Sensory: Unimpaired Palpation: Non-contributory Provocative Tests: Lumbar Hyperextension and rotation test: evaluation deferred today       Patrick's Maneuver: evaluation deferred today              Gait & Posture Assessment  Ambulation: Unassisted Gait: Relatively normal for age and body habitus Posture: WNL   Lower Extremity Exam    Side: Right lower extremity  Side: Left lower extremity  Inspection: No masses, redness, swelling, or asymmetry  Inspection: No masses, redness, swelling, or asymmetry  Functional ROM: ROM appears unrestricted  Functional ROM: ROM appears unrestricted  Muscle strength & Tone: Functionally intact  Muscle strength & Tone: Functionally intact  Sensory: Unimpaired  Sensory: Unimpaired  Palpation: Non-contributory  Palpation: Non-contributory    Assessment & Plan  Primary Diagnosis & Pertinent Problem List: The primary encounter diagnosis was Long term current use of opiate analgesic. Diagnoses of  Chronic pain, Neurogenic pain, Therapeutic opioid-induced constipation (OIC), Opiate use (60 MME/day), Chronic flank pain (Location of Primary Source of Pain) (Left), Chronic neck pain (B) (L>R), and Cervical facet syndrome (B) (L>R) were also pertinent to this visit.  Visit Diagnosis: 1. Long term current use of opiate analgesic   2. Chronic pain   3. Neurogenic pain   4. Therapeutic opioid-induced constipation (OIC)   5. Opiate use (60 MME/day)   6. Chronic flank pain (Location of Primary Source of Pain) (Left)   7. Chronic neck pain (B) (L>R)   8. Cervical facet syndrome (B) (L>R)     Problems updated and reviewed during this visit: Problem  Chronic neck pain (B) (L>R)  Cervical facet syndrome (B) (L>R)    Problem-specific Plan(s): No problem-specific Assessment & Plan notes found for this encounter.  No new Assessment & Plan notes have been filed under this hospital service since the last note was generated. 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) (Chronic)   Relevant Medications   morphine (MS CONTIN) 30 MG 12 hr tablet   morphine (MS CONTIN) 30 MG 12 hr tablet   morphine (MS CONTIN) 30 MG 12 hr tablet   gabapentin (NEURONTIN) 300 MG capsule   Other Relevant Orders   DG Cervical Spine Complete   Ambulatory referral to Physical Therapy   Chronic pain (Chronic)   Relevant Medications   morphine (MS CONTIN) 30 MG 12 hr tablet   morphine (MS CONTIN) 30 MG 12 hr tablet   morphine (MS CONTIN) 30 MG 12 hr tablet   gabapentin (NEURONTIN) 300 MG capsule   Neurogenic pain (Chronic)   Relevant Medications   gabapentin (NEURONTIN) 300 MG capsule     Medium   Long term current use of opiate analgesic - Primary (Chronic)    Relevant Medications   naloxone (NARCAN) 2 MG/2ML injection   Opiate use (60 MME/day) (Chronic)   Relevant Medications   naloxone (NARCAN) 2 MG/2ML injection   Opioid-induced constipation (OIC) (Chronic)   Relevant Medications   lubiprostone (AMITIZA) 8 MCG capsule   Wheat Dextrin (BENEFIBER) POWD   docusate sodium (COLACE) 100 MG capsule   bisacodyl (DULCOLAX) 5 MG EC tablet    Other Visit Diagnoses   None.      Pharmacotherapy (Medications Ordered): Meds ordered this encounter  Medications  . morphine (MS CONTIN) 30 MG 12 hr tablet    Sig: Take 1 tablet (30 mg total) by mouth every 12 (twelve) hours.    Dispense:  60 tablet    Refill:  0    Do not place this medication, or any other prescription from our practice, on "Automatic Refill". Patient may have prescription filled one day early if pharmacy is closed on scheduled refill date. Do not fill until: 04/05/16 To last until: 05/05/16  . morphine (MS CONTIN) 30 MG 12 hr tablet    Sig: Take 1 tablet (30 mg total) by mouth every 12 (twelve) hours.    Dispense:  60 tablet    Refill:  0    Do not place this medication, or any other prescription from our practice, on "Automatic Refill". Patient may have prescription filled one day early if pharmacy is closed on scheduled refill date. Do not fill until: 05/05/16 To last until: 06/04/16  . morphine (MS CONTIN) 30 MG 12 hr tablet    Sig: Take 1 tablet (30 mg total) by mouth every 12 (twelve) hours.    Dispense:  60 tablet    Refill:  0    Do not place this medication, or any other prescription from our practice, on "Automatic Refill". Patient may have prescription filled one day early if pharmacy is closed on scheduled refill date. Do not fill until: 06/04/16 To last until: 07/04/16  . gabapentin (NEURONTIN) 300 MG capsule    Sig: Take 3 capsules (900 mg total) by mouth 4 (four) times daily. May go up to 3 capsules 4 times a day. Titrate up slowly.    Dispense:  360 capsule     Refill:  2    Do not place this medication, or any other prescription from our practice, on "Automatic Refill". Patient may have prescription filled one day early if pharmacy is closed on scheduled refill date.  . lubiprostone (AMITIZA) 8 MCG capsule    Sig:  Take 1 capsule (8 mcg total) by mouth 2 (two) times daily with a meal. Swallow the medication whole. Do not break or chew the medication.    Dispense:  60 capsule    Refill:  2    Do not place this medication, or any other prescription from our practice, on "Automatic Refill". Patient may have prescription filled one day early if pharmacy is closed on scheduled refill date.  . Wheat Dextrin (BENEFIBER) POWD    Sig: Stir 2 teaspoons of Benefiber into 4-8 oz of any non-carbonated beverage or soft food (hot or cold) TID.    Dispense:  500 g    Refill:  PRN    Do not place this medication, or any other prescription from our practice, on "Automatic Refill". Patient may have prescription filled one day early if pharmacy is closed on scheduled refill date.  . naloxone (NARCAN) 2 MG/2ML injection    Sig: Inject content of syringe into thigh muscle. Call 911.    Dispense:  2 Syringe    Refill:  1    NDC # [id number]. Please teach proper use of device.  . docusate sodium (COLACE) 100 MG capsule    Sig: Take 2 capsules (200 mg total) by mouth at bedtime as needed for moderate constipation. Do not use longer than 7 days.    Dispense:  60 capsule    Refill:  PRN    Do not place this medication, or any other prescription from our practice, on "Automatic Refill". Patient may have prescription filled one day early if pharmacy is closed on scheduled refill date.  . bisacodyl (DULCOLAX) 5 MG EC tablet    Sig: Take 2 tablets (10 mg total) by mouth at bedtime as needed for moderate constipation ((Hold for loose stool)).    Dispense:  100 tablet    Refill:  PRN    Do not place this medication, or any other prescription from our practice, on "Automatic  Refill". Patient may have prescription filled one day early if pharmacy is closed on scheduled refill date.    Lab-work & Procedure Ordered: Orders Placed This Encounter  Procedures  . DG Cervical Spine Complete  . Ambulatory referral to Physical Therapy    Imaging Ordered: AMB REFERRAL TO PHYSICAL THERAPY  Interventional Therapies: Scheduled:  None at this time.    Considering:  Greater occipital nerve radiofrequency ablation.   PRN Procedures:  Left greater occipital nerve block under fluoroscopic guidance, with a without sedation.    Referral(s) or Consult(s): None at this time.  New Prescriptions   BISACODYL (DULCOLAX) 5 MG EC TABLET    Take 2 tablets (10 mg total) by mouth at bedtime as needed for moderate constipation ((Hold for loose stool)).   DOCUSATE SODIUM (COLACE) 100 MG CAPSULE    Take 2 capsules (200 mg total) by mouth at bedtime as needed for moderate constipation. Do not use longer than 7 days.   NALOXONE (NARCAN) 2 MG/2ML INJECTION    Inject content of syringe into thigh muscle. Call 911.    Medications administered during this visit: Ms. Engleman had no medications administered during this visit.  Requested PM Follow-up: Return in 3 months (on 06/12/2016) for Med-Mgmt, In addition F/U in 6 to 7 weeks from now to check on results of PT and X-rays..  Future Appointments Date Time Provider [place]  03/27/2016 11:55 AM [facility]  04/02/2016 12:00 PM Kirtland Bouchard, PhD [facility] None  06/12/2016 10:20 AM [name],  MD [facility] None    Primary Care Physician: Cari Caraway, MD Location: [facility] Note by: Kathlen Brunswick. Dossie Arbour, M.D, DABA, DABAPM, DABPM, DABIPP, FIPP  Pain Score Disclaimer: We use the NRS-11 scale. This is a self-reported, subjective measurement of pain severity with only modest accuracy. It is used primarily to identify changes within a particular patient. It must be understood that  outpatient pain scales are significantly less accurate that those used for research, where they can be applied under ideal controlled circumstances with minimal exposure to variables. In reality, the score is likely to be a combination of pain intensity and pain affect, where pain affect describes the degree of emotional arousal or changes in action readiness caused by the sensory experience of pain. Factors such as social and work situation, setting, emotional state, anxiety levels, expectation, and prior pain experience may influence pain perception and show large inter-individual differences that may also be affected by time variables.  Patient instructions provided during this appointment: Patient Instructions  Naloxone nasal spray What is this medicine? NALOXONE (nal OX one) is a narcotic blocker. It is used to treat narcotic drug overdose. This medicine may be used for other purposes; ask your health care provider or pharmacist if you have questions. What should I tell my health care provider before I take this medicine? They need to know if you have any of these conditions: -drug abuse or addiction -heart disease -an unusual or allergic reaction to naloxone, other medicines, foods, dyes, or preservatives -pregnant or trying to get pregnant -breast-feeding How should I use this medicine? This medicine is for use in the nose. Lay the person on their back. Support their neck with your hand and allow the head to tilt back before giving the medicine. The nasal spray should be given into 1 nostril. After giving the medicine, move the person onto their side. Do not remove or test the nasal spray until ready to use. Get emergency medical help right away after giving the first dose of this medicine, even if the person wakes up. You should be familiar with how to recognize the signs and symptoms of a narcotic overdose. If more doses are needed, give the additional dose in the other nostril. Talk to  your pediatrician regarding the use of this medicine in children. While this drug may be prescribed for children as young as newborns for selected conditions, precautions do apply. Overdosage: If you think you have taken too much of this medicine contact a poison control center or emergency room at once. NOTE: This medicine is only for you. Do not share this medicine with others. What if I miss a dose? This does not apply. What may interact with this medicine? This medicine is only used during an emergency. No interactions are expected during emergency use. This list may not describe all possible interactions. Give your health care provider a list of all the medicines, herbs, non-prescription drugs, or dietary supplements you use. Also tell them if you smoke, drink alcohol, or use illegal drugs. Some items may interact with your medicine. What should I watch for while using this medicine? Keep this medicine ready for use in the case of a narcotic overdose. Make sure that you have the phone number of your doctor or health care professional and local hospital ready. You may need to have additional doses of this medicine. Each nasal spray contains a single dose. Some emergencies may require additional doses. After use, bring the treated person to the nearest  hospital or call 911. Make sure the treating health care professional knows that the person has received an injection of this medicine. You will receive additional instructions on what to do during and after use of this medicine before an emergency occurs. What side effects may I notice from receiving this medicine? Side effects that you should report to your doctor or health care professional as soon as possible: -allergic reactions like skin rash, itching or hives, swelling of the face, lips, or tongue -breathing problems -fast, irregular heartbeat -high blood pressure -pain that was controlled by narcotic pain medicine -seizures Side effects  that usually do not require medical attention (report these to your doctor or health care professional if they continue or are bothersome): -anxious -chills -diarrhea -fever -headache -muscle pain -nausea, vomiting -nose irritation like dryness, congestion, and swelling -sweating This list may not describe all possible side effects. Call your doctor for medical advice about side effects. You may report side effects to FDA at 1-800-FDA-1088. Where should I keep my medicine? Keep out of the reach of children. Store between 4 and 40 degrees C (39 and 104 degrees F). Do not freeze. Throw away any unused medicine after the expiration date. Keep in original box until ready to use. NOTE: This sheet is a summary. It may not cover all possible information. If you have questions about this medicine, talk to your doctor, pharmacist, or health care provider.    2016, [employer]. (2014-07-15 14:24:03)

## 2016-03-27 NOTE — Progress Notes (Signed)
Safety precautions to be maintained throughout the outpatient stay will include: orient to surroundings, keep bed in low position, maintain call bell within reach at all times, provide assistance with transfer out of bed and ambulation.  Bottle labeled Morphine 30 mg # 17/60  Filled 03-06-16

## 2016-04-02 ENCOUNTER — Ambulatory Visit (INDEPENDENT_AMBULATORY_CARE_PROVIDER_SITE_OTHER): Payer: BLUE CROSS/BLUE SHIELD | Admitting: Psychology

## 2016-04-02 ENCOUNTER — Telehealth: Payer: Self-pay | Admitting: *Deleted

## 2016-04-02 DIAGNOSIS — F411 Generalized anxiety disorder: Secondary | ICD-10-CM | POA: Diagnosis not present

## 2016-04-02 DIAGNOSIS — G894 Chronic pain syndrome: Secondary | ICD-10-CM | POA: Diagnosis not present

## 2016-04-02 NOTE — Telephone Encounter (Signed)
Called pt states that [place] does not pay for Amitiza. States she taking OTC meds like Miralax and Benafiber. Also encouraged to increase water intake and fiber.

## 2016-04-09 ENCOUNTER — Ambulatory Visit: Payer: BLUE CROSS/BLUE SHIELD | Admitting: Psychology

## 2016-04-16 ENCOUNTER — Ambulatory Visit: Payer: BLUE CROSS/BLUE SHIELD | Admitting: Psychology

## 2016-04-25 ENCOUNTER — Telehealth: Payer: Self-pay

## 2016-04-25 ENCOUNTER — Other Ambulatory Visit: Payer: Self-pay | Admitting: Pain Medicine

## 2016-04-25 NOTE — Telephone Encounter (Signed)
The physical therapist treating the patient called and wants to speak with someone regarding the patient. She states that she is losing consciousness  at times.  She said the patient came in and told her that she passed out on the toilet, slipped off and hit her head on the tub. The patient has bruises on her head and lip. She would like to discuss this with someone.

## 2016-04-25 NOTE — Telephone Encounter (Signed)
Called and talked to pt about her incident. She states that she went to the bathroom and fell over and hit right forehead and abrasion on right upper lip. Patient did not go the ER-. Patient states that she is concerned about her medications and if she needs to see a neurololgist. When talking with pt she said that Dr Consuela Mimes was aware of her falls. Instructed pt that if she was concerned and if there were more falls or unconciouseness that she needed to go to the ER Patients next appt is not until 11/17

## 2016-04-25 NOTE — Telephone Encounter (Signed)
Called back and talked with the PT thathad pt for EVAL on yesterday. She states that pt had multiple bruises on face and was covering with makeup and was not sure why she kept "losing consciousness" She recently fell off the commode and hit hhead. Pt did not go the the ER.

## 2016-04-25 NOTE — Telephone Encounter (Signed)
Left message with PT department regarding patient. Instructed her if having difficulties with consciousness etc.  that she needs to go the ER or see her Primary Care Physican. Can called [place] back if needed.

## 2016-04-25 NOTE — Telephone Encounter (Signed)
Left message for patient- informed her that I talked with Dr Paulo Fruit and he would like for her to get an appt ASAP He seems to think that it could be the medication and would like for her to bring the remaining meds with her and 2 prescriptions as soon as see can get appt

## 2016-04-26 ENCOUNTER — Telehealth: Payer: Self-pay

## 2016-04-26 NOTE — Telephone Encounter (Signed)
Patient states that she was instructed to be seen and to make an appointment to be seen next week and to bring in her current prescription and her other written scripts to discuss medications and issues she has been heving. Appointment scheduled for Monday 04-29-2016 at 2pm and confirmed with patient. Patient also instructed to go to ED if she has any issues prior to her appointment.

## 2016-04-26 NOTE — Telephone Encounter (Signed)
Patient called and said that Cyndi told her to call back to discuss medications. She left vm saying she needed to speak with someone to discuss medications before she came back to the clinic.

## 2016-04-29 ENCOUNTER — Encounter: Payer: Self-pay | Admitting: Pain Medicine

## 2016-04-29 ENCOUNTER — Ambulatory Visit: Payer: BLUE CROSS/BLUE SHIELD | Attending: Pain Medicine | Admitting: Pain Medicine

## 2016-04-29 VITALS — BP 117/71 | HR 72 | Temp 97.7°F | Resp 16 | Ht 66.0 in | Wt 160.0 lb

## 2016-04-29 DIAGNOSIS — F329 Major depressive disorder, single episode, unspecified: Secondary | ICD-10-CM | POA: Insufficient documentation

## 2016-04-29 DIAGNOSIS — G8929 Other chronic pain: Secondary | ICD-10-CM | POA: Diagnosis not present

## 2016-04-29 DIAGNOSIS — T887XXA Unspecified adverse effect of drug or medicament, initial encounter: Secondary | ICD-10-CM | POA: Diagnosis not present

## 2016-04-29 DIAGNOSIS — R109 Unspecified abdominal pain: Secondary | ICD-10-CM | POA: Insufficient documentation

## 2016-04-29 DIAGNOSIS — F1190 Opioid use, unspecified, uncomplicated: Secondary | ICD-10-CM | POA: Diagnosis not present

## 2016-04-29 DIAGNOSIS — I10 Essential (primary) hypertension: Secondary | ICD-10-CM | POA: Insufficient documentation

## 2016-04-29 DIAGNOSIS — T50905A Adverse effect of unspecified drugs, medicaments and biological substances, initial encounter: Secondary | ICD-10-CM

## 2016-04-29 DIAGNOSIS — Z79891 Long term (current) use of opiate analgesic: Secondary | ICD-10-CM | POA: Insufficient documentation

## 2016-04-29 DIAGNOSIS — K219 Gastro-esophageal reflux disease without esophagitis: Secondary | ICD-10-CM | POA: Diagnosis not present

## 2016-04-29 NOTE — Progress Notes (Signed)
Patient's Name: Kari Gibbs  MRN: [id number]  Referring Provider: Cari Caraway, MD  DOB: [birth year]  PCP: Cari Caraway, MD  DOS: 04/29/2016  Note by: Kathlen Brunswick. Dossie Arbour, MD  Service setting: Ambulatory outpatient  Specialty: Interventional Pain Management  Location: [facility] (AMB) Pain Management Facility    Patient type: Established   Primary Reason(s) for Visit: Encounter for prescription drug management (Level of risk: moderate) CC: Abdominal Pain (left thoracic)  HPI  Ms. Reutzel is a 65 y.o. year old, female patient, who returns today as an established patient. 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; S/P laparoscopic cholecystectomy Oct 2014; Fracture of rib of left side; Varicose veins of leg with complications; Chronic pain; Long term current use of opiate analgesic; Long term prescription opiate use; Opiate use (60 MME/day); Encounter for therapeutic drug level monitoring; Opiate dependence ([place]); Encounter for chronic pain management; Chronic pain syndrome; Chronic flank pain (Location of Primary Source of Pain) (Left); Chronic thoracic radicular pain (Left-sided); Opioid-induced constipation (OIC); Occipital neuralgia of left side; Neuropathic pain; Neurogenic pain; History of rib fracture (Left 4th and 8th rib); Vertebral body hemangioma (T11); Metatarsalgia of right foot; Chronic abdominal pain (Bilateral) (L>R) (possible left-sided thoracic radiculopathy); Osteoarthritis; Return to work evaluation (FCE: Medium); Varicose veins of bilateral lower extremities with other complications; Varicose veins of right lower extremity with complications; Chronic neck pain (B) (L>R); Cervical facet syndrome (B) (L>R); and Medication side effect on her problem list.. Her primarily concern today is the Abdominal Pain (left thoracic)  Pain Assessment: Self-Reported Pain Score: 5               Reported level is compatible with observation.       Pain Type: Chronic pain Pain Location: Abdomen Pain Orientation: Left, Upper Pain Descriptors / Indicators: Aching, Constant Pain Frequency: Constant  The patient comes into the clinics today for pharmacological management of her chronic pain. I last saw this patient on 03/27/2016. The patient  reports that she does not use drugs. Her body mass index is 25.82 kg/m. The primary reason for her visit today is that she has been experiencing some side effects to her medications. After careful questioning, it would seem that it is secondary to her gabapentin as she was titrating it up. The patient has been instructed to back it down to the level where she was not having any problems. The primary problem that she was having was that she was getting sleepy. The last time that this occurred she apparently fell and hit her head against door of the shower. In addition to this, she is having problems going to sleep at night and as a consequence continued to have a lot of problems during the day with dosing off. In addition, she feels that taking the gabapentin 8 pills per day may be contributing to this. I have instructed her to go down to 6 pills per day. I have also gone over some instructions on what to do to be able to sleep better.  Date of Last Visit: 03/27/16 Service Provided on Last Visit: Med Refill  Controlled Substance Pharmacotherapy Assessment & REMS (Risk Evaluation and Mitigation Strategy)  Analgesic:MS Contin 30 mg every 12 hours (60 mg/day) MME/day:60 mg/day Pill Count: MS Contin 30 mg 12 hour tablet 11/60 last fill 04/04/16. Pharmacokinetics: Onset of action (Liberation/Absorption): Within expected pharmacological parameters Time to Peak effect (Distribution): Timing and results are as within normal expected  parameters Duration of action (Metabolism/Excretion): Within normal limits for medication Pharmacodynamics: Analgesic Effect:  More than 50% Activity Facilitation: Medication(s) allow patient to sit, stand, walk, and do the basic ADLs Perceived Effectiveness: Described as relatively effective, allowing for increase in activities of daily living (ADL) Side-effects or Adverse reactions: None reported Monitoring: [place] PMP: Online review of the past 25-month period conducted. Compliant with practice rules and regulations Last UDS on record: ToxAssure Select 13  Date Value Ref Range Status  01/04/2016 FINAL  Final    Comment:    ==================================================================== TOXASSURE SELECT 13 (MW) ==================================================================== Test                             Result       Flag       Units Drug Present and Declared for Prescription Verification   Morphine                       10334        EXPECTED   ng/mg creat    Potential sources of large amounts of morphine in the absence of    codeine include administration of morphine or use of heroin.   Hydromorphone                  213          EXPECTED   ng/mg creat    Hydromorphone may be present as a metabolite of morphine;    concentrations of hydromorphone rarely exceed 5% of the morphine    concentration when this is the source of hydromorphone. ==================================================================== Test                      Result    Flag   Units      Ref Range   Creatinine              90               mg/dL      >=20 ==================================================================== Declared Medications:  The flagging and interpretation on this report are based on the  following declared medications.  Unexpected results may arise from  inaccuracies in the declared medications.  **Note: The testing scope of this panel includes these medications:  Morphine  **Note: The testing scope of this panel does not include following  reported medications:  Chlorpheniramine  Doxycycline   Gabapentin  Iron (Ferrous Sulfate)  Lubiprostone  Magnesium  Metronidazole  Multivitamin (Prenatal Vitamin)  Naloxone  Omeprazole  Potassium  Supplement  Supplement (Probiotic)  Teriparatide  Triamcinolone acetonide  Valsartan  Venlafaxine  Vitamin D2 (Ergocalciferol) ==================================================================== For clinical consultation, please call [phone]. ====================================================================    UDS interpretation: Compliant          Medication Assessment Form: Reviewed. Patient indicates being compliant with therapy Treatment compliance: Compliant Risk Assessment: Aberrant Behavior: None observed today Substance Use Disorder (SUD) Risk Level: Low-to-moderate Risk of opioid abuse or dependence: 0.7-3.0% with doses ? 36 MME/day and 6.1-26% with doses ? 120 MME/day. Opioid Risk Tool (ORT) Score:  1 Low Risk for SUD (Score <3) Depression Scale Score: PHQ-2: PHQ-2 Total Score: 0 No depression (0) PHQ-9: PHQ-9 Total Score: 0 No depression (0-4)  Pharmacologic Plan: No change in therapy, at this time  Laboratory Chemistry  Inflammation Markers Lab Results  Component Value Date   ESRSEDRATE 17 01/04/2016   CRP <0.5 01/04/2016  Renal Function Lab Results  Component Value Date   BUN 20 01/04/2016   CREATININE 0.76 01/04/2016   GFRAA >60 01/04/2016   GFRNONAA >60 01/04/2016   Hepatic Function Lab Results  Component Value Date   AST 33 01/04/2016   ALT 26 01/04/2016   ALBUMIN 4.2 01/04/2016   Electrolytes Lab Results  Component Value Date   NA 139 01/04/2016   K 4.6 01/04/2016   CL 99 (L) 01/04/2016   CALCIUM 9.7 01/04/2016   MG 2.1 01/04/2016   Pain Modulating Vitamins Lab Results  Component Value Date   25OHVITD1 49 01/04/2016   25OHVITD2 25 01/04/2016   25OHVITD3 24 01/04/2016   VITAMINB12 321 01/04/2016   Coagulation Parameters Lab Results  Component Value Date   PLT 238 11/18/2014    Cardiovascular Lab Results  Component Value Date   HGB 11.1 (L) 11/18/2014   HCT 34.1 (L) 11/18/2014   Note: Lab results reviewed.  Recent Diagnostic Imaging  Dg Cervical Spine Complete  Result Date: 03/27/2016 CLINICAL DATA:  Popping noise along the left side of the neck with head rotation. No known injury. EXAM: CERVICAL SPINE - COMPLETE 4+ VIEW COMPARISON:  None. FINDINGS: No fracture.  No spondylolisthesis. Moderate loss disc height at C4-C5 through C6-C7. Small endplate spurs are noted at these levels. There is no significant neural foraminal narrowing. Bones are demineralized. Soft tissues are unremarkable. IMPRESSION: 1. No fracture, spondylolisthesis or acute finding. 2. 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 Electronically Signed   By: Lajean Manes M.D.   On: 03/27/2016 12:54    Meds  The patient has a current medication list which includes the following prescription(s): chlorpheniramine maleate, ferrous sulfate, gabapentin, magnesium, metronidazole, morphine, morphine, morphine, naloxone, omeprazole, potassium gluconate, premarin, prenatal vit-fe fum-fa-omega, align, teriparatide (recombinant), triamcinolone cream, UNABLE TO FIND, valsartan, venlafaxine xr, vitamin d (ergocalciferol), and benefiber.  Current Outpatient Prescriptions on File Prior to Visit  Medication Sig  . CHLORPHENIRAMINE MALEATE PO Take 4 mg by mouth every 6 (six) hours as needed.  . ferrous sulfate 325 (65 FE) MG tablet Take 325 mg by mouth daily with breakfast.  . gabapentin (NEURONTIN) 300 MG capsule Take 3 capsules (900 mg total) by mouth 4 (four) times daily. May go up to 3 capsules 4 times a day. Titrate up slowly.  . Magnesium 500 MG TABS Take 1 tablet by mouth 2 (two) times daily.  Marland Kitchen morphine (MS CONTIN) 30 MG 12 hr tablet Take 1 tablet (30 mg total) by mouth every 12 (twelve) hours.  Marland Kitchen morphine (MS CONTIN) 30 MG 12 hr tablet Take 1 tablet (30 mg total) by mouth every 12 (twelve) hours.   Marland Kitchen morphine (MS CONTIN) 30 MG 12 hr tablet Take 1 tablet (30 mg total) by mouth every 12 (twelve) hours.  . naloxone (NARCAN) 2 MG/2ML injection Inject content of syringe into thigh muscle. Call 911.  . omeprazole (PRILOSEC) 20 MG capsule Take 20 mg by mouth 2 (two) times daily.  . potassium gluconate 595 MG TABS tablet Take 595 mg by mouth daily as needed (low potassium).  Marland Kitchen PREMARIN vaginal cream INSERT 1GRAM VAGINALLY 3 TIMES WEEKLY AT BEDTIME  . PRENATAL VIT-FE FUM-FA-OMEGA PO Take 1 tablet by mouth daily.  . Probiotic Product (ALIGN) 4 MG CAPS Take 1 capsule by mouth at bedtime. Per Pt.  . Teriparatide, Recombinant, (FORTEO) 600 MCG/2.4ML SOLN Inject 20 mcg into the skin daily.  Marland Kitchen UNABLE TO FIND Take 1 tablet by mouth daily as needed (nausea).  Med Name: [place] [address] Pepogest (Peppermint oil)  . valsartan (DIOVAN) 40 MG tablet Take 40 mg by mouth every morning.  . venlafaxine XR (EFFEXOR-XR) 75 MG 24 hr capsule Take 225 mg by mouth every morning.  . Vitamin D, Ergocalciferol, (DRISDOL) 50000 units CAPS capsule 50,000 Units every 7 (seven) days.   . Wheat Dextrin (BENEFIBER) POWD Stir 2 teaspoons of Benefiber into 4-8 oz of any non-carbonated beverage or soft food (hot or cold) TID.   No current facility-administered medications on file prior to visit.     ROS  Constitutional: Denies any fever or chills Gastrointestinal: No reported hemesis, hematochezia, vomiting, or acute GI distress Musculoskeletal: Denies any acute onset joint swelling, redness, loss of ROM, or weakness Neurological: No reported episodes of acute onset apraxia, aphasia, dysarthria, agnosia, amnesia, paralysis, loss of coordination, or loss of consciousness  Allergies  Ms. Gotts is allergic to iohexol; ace inhibitors; losartan potassium; and neosporin [neomycin-bacitracin zn-polymyx].  [place]  Medical:  Ms. 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 Family: family history includes Alzheimer's disease in her father; Hypertension in her mother. 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 Tobacco:  reports that she has never smoked. She has never used smokeless tobacco. Alcohol:  reports that she does not drink alcohol. Drug:  reports that she does not use drugs.  Constitutional Exam  General appearance: Well nourished, well developed, and well hydrated. In no acute distress. Today she does appear a little bit somnolent and she dosed off a couple times during the interview. Her husband was present and he is the one driving. Vitals:   04/29/16 1440  BP: 117/71  Pulse: 72  Resp: 16  Temp: 97.7 F (36.5 C)  TempSrc: Oral  SpO2: 100%  Weight: 160 lb (72.6 kg)  Height: 5\' 6"  (1.676 m)  BMI Assessment: Estimated body mass index is 25.82 kg/m as calculated from the following:   Height as of this encounter: 5\' 6"  (1.676 m).   Weight as of this encounter: 160 lb (72.6 kg).   BMI interpretation: (25-29.9 kg/m2) = Overweight: This range is associated with a 20% higher incidence of chronic pain. BMI Readings from Last 4 Encounters:  04/29/16 25.82 kg/m  03/27/16 25.82 kg/m  01/04/16 24.05 kg/m  11/13/15 24.05 kg/m   Wt Readings from Last 4 Encounters:  04/29/16 160 lb (72.6 kg)  03/27/16 160 lb (72.6 kg)  01/04/16  149 lb (67.6 kg)  11/13/15 149 lb (67.6 kg)  Psych/Mental status: Alert and oriented x 3 (person, place, & time) Eyes: PERLA Respiratory: No evidence of acute respiratory distress  Cervical Spine Exam  Inspection: No masses, redness, or swelling Alignment: Symmetrical Functional ROM: ROM appears unrestricted Stability: No instability detected Muscle strength & Tone: Functionally intact Sensory: Unimpaired Palpation: Non-contributory  Upper Extremity (UE) Exam    Side: Right upper extremity  Side: Left upper extremity  Inspection: No masses, redness, swelling, or asymmetry  Inspection: No masses, redness, swelling, or asymmetry  Functional ROM:  ROM appears unrestricted          Functional ROM: ROM appears unrestricted          Muscle strength & Tone: Functionally intact  Muscle strength & Tone: Functionally intact  Sensory: Unimpaired  Sensory: Unimpaired  Palpation: Non-contributory  Palpation: Non-contributory   Thoracic Spine Exam  Inspection: No masses, redness, or swelling Alignment: Symmetrical Functional ROM: ROM appears unrestricted Stability: No instability detected Sensory: Unimpaired Muscle strength & Tone: Functionally intact Palpation: Non-contributory  Lumbar Spine Exam  Inspection: No masses, redness, or swelling Alignment: Symmetrical Functional ROM: ROM appears unrestricted Stability: No instability detected Muscle strength & Tone: Functionally intact Sensory: Unimpaired Palpation: Non-contributory Provocative Tests: Lumbar Hyperextension and rotation test: evaluation deferred today       Patrick's Maneuver: evaluation deferred today              Gait & Posture Assessment  Ambulation: Unassisted Gait: Relatively normal for age and body habitus Posture: WNL   Lower Extremity Exam    Side: Right lower extremity  Side: Left lower extremity  Inspection: No masses, redness, swelling, or asymmetry  Inspection: No masses, redness, swelling, or asymmetry   Functional ROM: ROM appears unrestricted          Functional ROM: ROM appears unrestricted          Muscle strength & Tone: Functionally intact  Muscle strength & Tone: Functionally intact  Sensory: Unimpaired  Sensory: Unimpaired  Palpation: Non-contributory  Palpation: Non-contributory   Assessment & Plan  Primary Diagnosis & Pertinent Problem List: The primary encounter diagnosis was Chronic pain. Diagnoses of Opiate use (60 MME/day), Long term current use of opiate analgesic, and Medication side effect, initial encounter were also pertinent to this visit.  Visit Diagnosis: 1. Chronic pain   2. Opiate use (60 MME/day)   3. Long term current use of opiate analgesic   4. Medication side effect, initial encounter     Problems updated and reviewed during this visit: Problem  Medication Side Effect   Problem-specific Plan(s): No problem-specific Assessment & Plan notes found for this encounter.  No new Assessment & Plan notes have been filed under this hospital service since the last note was generated. Service: Pain Management  Plan of Care   Problem List Items Addressed This Visit      High   Chronic pain - Primary (Chronic)   Medication side effect     Medium   Long term current use of opiate analgesic (Chronic)   Opiate use (60 MME/day) (Chronic)    Other Visit Diagnoses   None.    Pharmacotherapy (Medications Ordered): No orders of the defined types were placed in this encounter.  Lab-work & Procedure Ordered: No orders of the defined types were placed in this encounter.  Imaging Ordered: None  Interventional Therapies: Scheduled:None at this time.    Considering:Greater occipital nerve radiofrequency ablation.   PRN Procedures:Left greater occipital nerve block under fluoroscopic guidance, with a without sedation.    Referral(s) or Consult(s): None at this time.  New Prescriptions   No medications on file    Medications administered  during this visit: Ms. Herzog had no medications administered during this visit.  Requested PM Follow-up: Return for Keep prior appointment.  Future Appointments Date Time Provider [place]  04/30/2016 12:00 PM Kirtland Bouchard, PhD [facility] None  06/12/2016 10:20 AM [name], MD [facility] None    Primary Care Physician: Cari Caraway, MD Location: [facility] Outpatient Pain Management Facility Note by:  Westly Hinnant A. Dossie Arbour, M.D, DABA, DABAPM, DABPM, DABIPP, FIPP  Pain Score Disclaimer: We use the NRS-11 scale. This is a self-reported, subjective measurement of pain severity with only modest accuracy. It is used primarily to identify changes within a particular patient. It must be understood that outpatient pain scales are significantly less accurate that those used for research, where they can be applied under ideal controlled circumstances with minimal exposure to variables. In reality, the score is likely to be a combination of pain intensity and pain affect, where pain affect describes the degree of emotional arousal or changes in action readiness caused by the sensory experience of pain. Factors such as social and work situation, setting, emotional state, anxiety levels, expectation, and prior pain experience may influence pain perception and show large inter-individual differences that may also be affected by time variables.  Patient instructions provided during this appointment: Patient Instructions  Pain Management Discharge Instructions  General Discharge Instructions :  If you need to reach your doctor call: Monday-Friday 8:00 am - 4:00 pm at [phone] or toll free [phone].  After clinic hours [phone] to have operator reach doctor.  Bring all of your medication bottles to all your appointments in the pain clinic.  To cancel or reschedule your appointment with Pain Management please remember to call 24 hours in advance to avoid a fee.  Refer to the  educational materials which you have been given on: General Risks, I had my Procedure. Discharge Instructions, Post Sedation.  Post Procedure Instructions:  The drugs you were given will stay in your system until tomorrow, so for the next 24 hours you should not drive, make any legal decisions or drink any alcoholic beverages.  You may eat anything you prefer, but it is better to start with liquids then soups and crackers, and gradually work up to solid foods.  Please notify your doctor immediately if you have any unusual bleeding, trouble breathing or pain that is not related to your normal pain.  Depending on the type of procedure that was done, some parts of your body may feel week and/or numb.  This usually clears up by tonight or the next day.  Walk with the use of an assistive device or accompanied by an adult for the 24 hours.  You may use ice on the affected area for the first 24 hours.  Put ice in a Ziploc bag and cover with a towel and place against area 15 minutes on 15 minutes off.  You may switch to heat after 24 hours.

## 2016-04-29 NOTE — Progress Notes (Addendum)
Patient here for medication management d/t a fall that she thinks is medication induced.  Patient feels that this is being caused by the use of Gabapentin.  MS  Contin 30 mg 12 hour tablet 11/60 last fill 04/04/16  Safety precautions to be maintained throughout the outpatient stay will include: orient to surroundings, keep bed in low position, maintain call bell within reach at all times, provide assistance with transfer out of bed and ambulation.

## 2016-04-29 NOTE — Patient Instructions (Signed)

## 2016-04-30 ENCOUNTER — Ambulatory Visit (INDEPENDENT_AMBULATORY_CARE_PROVIDER_SITE_OTHER): Payer: BLUE CROSS/BLUE SHIELD | Admitting: Psychology

## 2016-04-30 DIAGNOSIS — G894 Chronic pain syndrome: Secondary | ICD-10-CM | POA: Diagnosis not present

## 2016-04-30 DIAGNOSIS — F411 Generalized anxiety disorder: Secondary | ICD-10-CM | POA: Diagnosis not present

## 2016-05-07 ENCOUNTER — Ambulatory Visit (INDEPENDENT_AMBULATORY_CARE_PROVIDER_SITE_OTHER): Payer: BLUE CROSS/BLUE SHIELD | Admitting: Psychology

## 2016-05-07 DIAGNOSIS — F411 Generalized anxiety disorder: Secondary | ICD-10-CM

## 2016-05-07 DIAGNOSIS — G894 Chronic pain syndrome: Secondary | ICD-10-CM | POA: Diagnosis not present

## 2016-05-14 ENCOUNTER — Ambulatory Visit: Payer: BLUE CROSS/BLUE SHIELD | Admitting: Psychology

## 2016-05-17 ENCOUNTER — Telehealth: Payer: Self-pay | Admitting: Pain Medicine

## 2016-05-17 NOTE — Telephone Encounter (Signed)
Left message forCaitlyn to call office to discuss reaction.

## 2016-05-17 NOTE — Telephone Encounter (Signed)
Patient came in for treatment on 05-16-16 and had adverse reactions that are very concerning and they would like to discuss with nurse, please call  Lexine Baton or [place] at [phone]

## 2016-05-20 ENCOUNTER — Other Ambulatory Visit: Payer: Self-pay | Admitting: Pain Medicine

## 2016-05-20 ENCOUNTER — Encounter: Payer: Self-pay | Admitting: Pain Medicine

## 2016-05-20 DIAGNOSIS — M792 Neuralgia and neuritis, unspecified: Secondary | ICD-10-CM

## 2016-05-20 DIAGNOSIS — M62838 Other muscle spasm: Secondary | ICD-10-CM | POA: Insufficient documentation

## 2016-05-20 DIAGNOSIS — R251 Tremor, unspecified: Secondary | ICD-10-CM

## 2016-05-20 NOTE — Telephone Encounter (Signed)
Spoke with [name], PTA.  States that patient came for physical therapy treatment and had a 40 minute episode of something resembling full body tics.  Patient was lying down, pre treatment, with a hot pack on and began to have what appeared to be leg, arm and neck muscle spasms.  Vital signs stable, able to talk throughout episode.  Patient states she has these weekly.  [name] states patient was very non chalant during episode, even stating that this happens all the time.  States she normally lays down and watches TV at home until it goes away. Therapist videoed entire episode on patients phone.  They were very concerned and  do not want to conitnue therapy until evaluated by MD. Therapist instructed patient to call office and schedule appointment.  Patient has appointment on 06-12-16 from previous appointment.

## 2016-05-20 NOTE — Telephone Encounter (Signed)
Spoke to Dr Dossie Arbour.  Verbal order received for a Neurology consult and discontinue Physical Therapy until further notice.  Call and left message for patient to call office to inform her of new orders.

## 2016-05-21 ENCOUNTER — Ambulatory Visit (INDEPENDENT_AMBULATORY_CARE_PROVIDER_SITE_OTHER): Payer: BLUE CROSS/BLUE SHIELD | Admitting: Psychology

## 2016-05-21 DIAGNOSIS — G894 Chronic pain syndrome: Secondary | ICD-10-CM

## 2016-05-21 DIAGNOSIS — F411 Generalized anxiety disorder: Secondary | ICD-10-CM | POA: Diagnosis not present

## 2016-05-21 NOTE — Telephone Encounter (Signed)
Spoke with patient and she wants to talk to her PCP before choosing a neurologist.  She will call us back to let us know so that we may set up referral. Thank you

## 2016-05-21 NOTE — Telephone Encounter (Signed)
Can you please get the neurology consult as ordered by Dr Dossie Arbour on 05-20-16.  Thank you.

## 2016-05-21 NOTE — Telephone Encounter (Signed)
Patient called back and is fine with [place] sending her to [facility] in [place]. Front desk is aware.

## 2016-05-28 ENCOUNTER — Ambulatory Visit (INDEPENDENT_AMBULATORY_CARE_PROVIDER_SITE_OTHER): Payer: BLUE CROSS/BLUE SHIELD | Admitting: Psychology

## 2016-05-28 DIAGNOSIS — F411 Generalized anxiety disorder: Secondary | ICD-10-CM | POA: Diagnosis not present

## 2016-05-28 DIAGNOSIS — G894 Chronic pain syndrome: Secondary | ICD-10-CM | POA: Diagnosis not present

## 2016-06-11 ENCOUNTER — Ambulatory Visit: Payer: BLUE CROSS/BLUE SHIELD | Admitting: Psychology

## 2016-06-12 ENCOUNTER — Ambulatory Visit: Payer: BLUE CROSS/BLUE SHIELD | Attending: Pain Medicine | Admitting: Pain Medicine

## 2016-06-12 ENCOUNTER — Encounter: Payer: Self-pay | Admitting: Pain Medicine

## 2016-06-12 VITALS — BP 125/57 | HR 67 | Temp 98.1°F | Resp 16 | Ht 66.0 in | Wt 172.0 lb

## 2016-06-12 DIAGNOSIS — Z79891 Long term (current) use of opiate analgesic: Secondary | ICD-10-CM | POA: Diagnosis not present

## 2016-06-12 DIAGNOSIS — M792 Neuralgia and neuritis, unspecified: Secondary | ICD-10-CM

## 2016-06-12 DIAGNOSIS — I10 Essential (primary) hypertension: Secondary | ICD-10-CM | POA: Diagnosis not present

## 2016-06-12 DIAGNOSIS — R253 Fasciculation: Secondary | ICD-10-CM | POA: Diagnosis not present

## 2016-06-12 DIAGNOSIS — G894 Chronic pain syndrome: Secondary | ICD-10-CM | POA: Diagnosis not present

## 2016-06-12 DIAGNOSIS — M546 Pain in thoracic spine: Secondary | ICD-10-CM | POA: Diagnosis not present

## 2016-06-12 MED ORDER — MORPHINE SULFATE ER 30 MG PO TBCR: 30.0000 mg | EXTENDED_RELEASE_TABLET | Freq: Two times a day (BID) | ORAL | 0 refills | Status: DC

## 2016-06-12 MED ORDER — GABAPENTIN 300 MG PO CAPS: 900.0000 mg | ORAL_CAPSULE | Freq: Four times a day (QID) | ORAL | 2 refills | Status: DC

## 2016-06-12 NOTE — Progress Notes (Signed)
Patient's Name: Kari Gibbs  MRN: [id number]  Referring Provider: Cari Caraway, MD  DOB: 1951/03/15  PCP: Cari Caraway, MD  DOS: 06/12/2016  Note by: Kathlen Brunswick. Dossie Arbour, MD  Service setting: Ambulatory outpatient  Specialty: Interventional Pain Management  Location: [facility] (AMB) Pain Management Facility    Patient type: Established   Primary Reason(s) for Visit: Encounter for prescription drug management (Level of risk: moderate) CC: Abdominal Pain (left sided thoracic pain that goes around to the back area.)  HPI  Kari Gibbs is a 65 y.o. year old, female patient, who comes today for an initial evaluation. 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; S/P laparoscopic cholecystectomy Oct 2014; Fracture of rib of left side; Chronic pain; Long term current use of opiate analgesic; Long term prescription opiate use; Opiate use (60 MME/day); Encounter for therapeutic drug level monitoring; Opiate dependence ([place]); Encounter for chronic pain management; Chronic pain syndrome; Chronic flank pain (Location of Primary Source of Pain) (Left); Chronic thoracic radicular pain (Left-sided); Opioid-induced constipation (OIC); Occipital neuralgia of left side; Neuropathic pain; Neurogenic pain; History of rib fracture (Left 4th and 8th rib); Vertebral body hemangioma (T11); Metatarsalgia of right foot; Chronic abdominal pain (Bilateral) (L>R) (possible left-sided thoracic radiculopathy); Osteoarthritis; Return to work evaluation (FCE: Medium); Varicose veins of bilateral lower extremities with other complications; Varicose veins of right lower extremity with complications; Chronic neck pain (B) (L>R); Cervical facet syndrome (B) (L>R); Medication side effect; Muscle spasticity; Tremors of nervous system; and Muscle twitching on her problem list.. Her primarily concern today is the Abdominal Pain  (left sided thoracic pain that goes around to the back area.)  Pain Assessment: Self-Reported Pain Score: 4 /10             Reported level is compatible with observation.       Pain Type: Chronic pain Pain Location: Abdomen Pain Orientation: Left (left thoracic pain that radiates around to mid back) Pain Descriptors / Indicators: Aching, Constant Pain Frequency: Constant  Kari Gibbs was last seen on 05/20/2016 for medication management. During today's appointment we reviewed Ms. Lofton's chronic pain status, as well as her outpatient medication regimen.  The patient  reports that she does not use drugs. Her body mass index is 27.76 kg/m.  Further details on both, my assessment(s), as well as the proposed treatment plan, please see below.  Controlled Substance Pharmacotherapy Assessment REMS (Risk Evaluation and Mitigation Strategy)  Analgesic:MS Contin 30 mg every 12 hours (60 mg/day) MME/day:60 mg/day  Janett Billow, RN  06/12/2016 10:35 AM  Sign at close encounter Nursing Pain Medication Assessment:  Safety precautions to be maintained throughout the outpatient stay will include: orient to surroundings, keep bed in low position, maintain call bell within reach at all times, provide assistance with transfer out of bed and ambulation.  Medication Inspection Compliance: Pill count conducted under aseptic conditions, in front of the patient. Neither the pills nor the bottle was removed from the patient's sight at any time. Once count was completed pills were immediately returned to the patient in their original bottle.  Medication: See above Pill Count: 43 of 60 pills remain Bottle Appearance: Standard pharmacy container. Clearly labeled. Filled Date: 10 / 24 / 2017 Medication last intake: 1101/17 @ 0900   Pharmacokinetics: Liberation and absorption (onset of action): WNL Distribution (time to peak effect): WNL Metabolism and excretion (duration of action): WNL  Pharmacodynamics: Desired effects: Analgesia: The patient reports >50% benefit. Reported improvement in function: The patient reports medication allows her to accomplish basic ADLs. Clinically meaningful improvement in function (CMIF): Sustained CMIF goals met Perceived effectiveness: Described as relatively effective, allowing for increase in activities of daily living (ADL) Undesirable effects: Side-effects or Adverse reactions: None reported Monitoring: [place] PMP: Online review of the past 73-monthperiod conducted. Compliant with practice rules and regulations List of all UDS test(s) done:  Lab Results  Component Value Date   TOXASSSELUR FINAL 01/04/2016   TOXASSSELUR FINAL 09/27/2015   [place] 06/28/2015   Last UDS on record: ToxAssure Select 13  Date Value Ref Range Status  01/04/2016 FINAL  Final    Comment:    ==================================================================== TOXASSURE SELECT 13 (MW) ==================================================================== Test                             Result       Flag       Units Drug Present and Declared for Prescription Verification   Morphine                       10334        EXPECTED   ng/mg creat    Potential sources of large amounts of morphine in the absence of    codeine include administration of morphine or use of heroin.   Hydromorphone                  213          EXPECTED   ng/mg creat    Hydromorphone may be present as a metabolite of morphine;    concentrations of hydromorphone rarely exceed 5% of the morphine    concentration when this is the source of hydromorphone. ==================================================================== Test                      Result    Flag   Units      Ref Range   Creatinine              90               mg/dL      >=20 ==================================================================== Declared Medications:  The flagging and interpretation on this report  are based on the  following declared medications.  Unexpected results may arise from  inaccuracies in the declared medications.  **Note: The testing scope of this panel includes these medications:  Morphine  **Note: The testing scope of this panel does not include following  reported medications:  Chlorpheniramine  Doxycycline  Gabapentin  Iron (Ferrous Sulfate)  Lubiprostone  Magnesium  Metronidazole  Multivitamin (Prenatal Vitamin)  Naloxone  Omeprazole  Potassium  Supplement  Supplement (Probiotic)  Teriparatide  Triamcinolone acetonide  Valsartan  Venlafaxine  Vitamin D2 (Ergocalciferol) ==================================================================== For clinical consultation, please call ([phone] ====================================================================    UDS interpretation: Compliant          Medication Assessment Form: Reviewed. Patient indicates being compliant with therapy Treatment compliance: Compliant Risk Assessment Profile: Aberrant behavior: See prior evaluations. None observed or detected today Comorbid factors increasing risk of overdose: See prior notes. No additional risks detected today Risk of substance use disorder (SUD): Low Opioid Risk Tool (ORT) Total Score: 3  Interpretation Table:  Score <3 = Low Risk for SUD  Score between 4-7 = Moderate Risk for SUD  Score >8 =  High Risk for Opioid Abuse   Risk Mitigation Strategies:  Patient Counseling: Covered Patient-Prescriber Agreement (PPA): Present and active  Notification to other healthcare providers: Done  Pharmacologic Plan: No change in therapy, at this time  Laboratory Chemistry  Inflammation Markers Lab Results  Component Value Date   ESRSEDRATE 17 01/04/2016   CRP <0.5 01/04/2016   Renal Function Lab Results  Component Value Date   BUN 20 01/04/2016   CREATININE 0.76 01/04/2016   GFRAA >60 01/04/2016   GFRNONAA >60 01/04/2016   Hepatic Function Lab  Results  Component Value Date   AST 33 01/04/2016   ALT 26 01/04/2016   ALBUMIN 4.2 01/04/2016   Electrolytes Lab Results  Component Value Date   NA 139 01/04/2016   K 4.6 01/04/2016   CL 99 (L) 01/04/2016   CALCIUM 9.7 01/04/2016   MG 2.1 01/04/2016   Pain Modulating Vitamins Lab Results  Component Value Date   25OHVITD1 49 01/04/2016   25OHVITD2 25 01/04/2016   25OHVITD3 24 01/04/2016   VITAMINB12 321 01/04/2016   Coagulation Parameters Lab Results  Component Value Date   PLT 238 11/18/2014   Cardiovascular Lab Results  Component Value Date   HGB 11.1 (L) 11/18/2014   HCT 34.1 (L) 11/18/2014   Note: Lab results reviewed.  Recent Diagnostic Imaging Review  Dg Cervical Spine Complete  Result Date: 03/27/2016 CLINICAL DATA:  Popping noise along the left side of the neck with head rotation. No known injury. EXAM: CERVICAL SPINE - COMPLETE 4+ VIEW COMPARISON:  None. FINDINGS: No fracture.  No spondylolisthesis. Moderate loss disc height at C4-C5 through C6-C7. Small endplate spurs are noted at these levels. There is no significant neural foraminal narrowing. Bones are demineralized. Soft tissues are unremarkable. IMPRESSION: 1. No fracture, spondylolisthesis or acute finding. 2. 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 Electronically Signed   By: Lajean Manes M.D.   On: 03/27/2016 12:54   Note: Imaging results reviewed.  Meds  The patient has a current medication list which includes the following prescription(s): buspirone, chlorpheniramine maleate, cholecalciferol, ferrous sulfate, gabapentin, magnesium, metronidazole, morphine, morphine, morphine, naloxone, omeprazole, potassium gluconate, premarin, prenatal vit-fe fum-fa-omega, align, teriparatide (recombinant), triamcinolone cream, UNABLE TO FIND, valsartan, venlafaxine xr, and benefiber.  Current Outpatient Prescriptions on File Prior to Visit  Medication Sig  . CHLORPHENIRAMINE MALEATE PO Take 4 mg by  mouth every 6 (six) hours as needed.  . ferrous sulfate 325 (65 FE) MG tablet Take 325 mg by mouth daily with breakfast.  . Magnesium 500 MG TABS Take 1 tablet by mouth 2 (two) times daily.  . metroNIDAZOLE (METROCREAM) 0.75 % cream APPLY ON THE SKIN AS DIRECTED. APPLY TO AFFECTED AREAS DAILY AS NEEDED.  . naloxone (NARCAN) 2 MG/2ML injection Inject content of syringe into thigh muscle. Call 911.  . omeprazole (PRILOSEC) 20 MG capsule Take 20 mg by mouth 2 (two) times daily.  . potassium gluconate 595 MG TABS tablet Take 595 mg by mouth daily as needed (low potassium).  Marland Kitchen PREMARIN vaginal cream INSERT 1GRAM VAGINALLY 3 TIMES WEEKLY AT BEDTIME  . PRENATAL VIT-FE FUM-FA-OMEGA PO Take 1 tablet by mouth daily.  . Probiotic Product (ALIGN) 4 MG CAPS Take 1 capsule by mouth at bedtime. Per Pt.  . Teriparatide, Recombinant, (FORTEO) 600 MCG/2.4ML SOLN Inject 20 mcg into the skin daily.  Marland Kitchen triamcinolone cream (KENALOG) 0.1 % ON THE SKIN TWICE A DAY. APPLY TO AFFECTED AREAS TWICE A DAY AS NEEDED.  Marland Kitchen UNABLE TO FIND  Take 1 tablet by mouth daily as needed (nausea). Med Name: [place] Pepogest (Peppermint oil)  . valsartan (DIOVAN) 40 MG tablet Take 40 mg by mouth every morning.  . venlafaxine XR (EFFEXOR-XR) 75 MG 24 hr capsule Take 225 mg by mouth every morning.  . Wheat Dextrin (BENEFIBER) POWD Stir 2 teaspoons of Benefiber into 4-8 oz of any non-carbonated beverage or soft food (hot or cold) TID.   No current facility-administered medications on file prior to visit.    ROS  Constitutional: Denies any fever or chills Gastrointestinal: No reported hemesis, hematochezia, vomiting, or acute GI distress Musculoskeletal: Denies any acute onset joint swelling, redness, loss of ROM, or weakness Neurological: No reported episodes of acute onset apraxia, aphasia, dysarthria, agnosia, amnesia, paralysis, loss of coordination, or loss of consciousness  Allergies  Ms. Kimmey is allergic to iohexol; ace  inhibitors; losartan potassium; and neosporin [neomycin-bacitracin zn-polymyx].  [place]  Drug: Ms. Tutor  reports that she does not use drugs. Alcohol:  reports that she does not drink alcohol. Tobacco:  reports that she has never smoked. She has never used smokeless tobacco. 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 Family: family history includes Alzheimer's disease in her father; Hypertension in her mother.  Past Surgical History:  Procedure Laterality Date  . ABDOMINAL HYSTERECTOMY  1988   partial  . CHOLECYSTECTOMY N/A 05/18/2013   Procedure: LAPAROSCOPIC CHOLECYSTECTOMY WITH INTRAOPERATIVE CHOLANGIOGRAM/laparoscopy/ ;  Surgeon: Pedro Earls, MD;  Location: [facility];  Service: General;  Laterality: N/A;  . EXPLORATORY LAPAROTOMY  2012  . GASTRIC BYPASS  11/13/2009  . HERNIA REPAIR  2011   hiatal  . 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 TMJ- LEFT--ABOUT 10 YRS AGO--LEFT SIDE--PT NOW HAS SOME PAIN LEFT JAW Left    still has pain, scar tissue has been removed   Constitutional Exam  General appearance: Well nourished, well developed, and well hydrated. In no apparent acute distress Vitals:   06/12/16 1029  BP: (!) 125/57  Pulse: 67  Resp: 16  Temp: 98.1 F (36.7 C)  TempSrc: Oral  SpO2: 99%  Weight: 172 lb (78 kg)  Height: 5' 6" (1.676 m)   BMI Assessment: Estimated body mass index is 27.76 kg/m as calculated  from the following:   Height as of this encounter: 5' 6" (1.676 m).   Weight as of this encounter: 172 lb (78 kg).  BMI interpretation table: BMI level Category Range association with higher incidence of chronic pain  <18 kg/m2 Underweight   18.5-24.9 kg/m2 Ideal body weight   25-29.9 kg/m2 Overweight Increased incidence by 20%  30-34.9 kg/m2 Obese (Class I) Increased incidence by 68%  35-39.9 kg/m2 Severe obesity (Class II) Increased incidence by 136%  >40 kg/m2 Extreme obesity (Class III) Increased incidence by 254%   BMI Readings from Last 4 Encounters:  06/12/16 27.76 kg/m  04/29/16 25.82 kg/m  03/27/16 25.82 kg/m  01/04/16 24.05 kg/m   Wt Readings from Last 4 Encounters:  06/12/16 172 lb (78 kg)  04/29/16 160 lb (72.6  kg)  03/27/16 160 lb (72.6 kg)  01/04/16 149 lb (67.6 kg)  Psych/Mental status: Alert, oriented x 3 (person, place, & time) Eyes: PERLA Respiratory: No evidence of acute respiratory distress  Cervical Spine Exam  Inspection: No masses, redness, or swelling Alignment: Symmetrical Functional ROM: Diminished ROM Stability: No instability detected Muscle strength & Tone: Functionally intact Sensory: Unimpaired Palpation: Non-contributory  Upper Extremity (UE) Exam    Side: Right upper extremity  Side: Left upper extremity  Inspection: No masses, redness, swelling, or asymmetry  Inspection: No masses, redness, swelling, or asymmetry  Functional ROM: Unrestricted ROM         Functional ROM: Unrestricted ROM          Muscle strength & Tone: Functionally intact  Muscle strength & Tone: Functionally intact  Sensory: Unimpaired  Sensory: Unimpaired  Palpation: Non-contributory  Palpation: Non-contributory   Thoracic Spine Exam  Inspection: No masses, redness, or swelling Alignment: Symmetrical Functional ROM: Unrestricted ROM Stability: No instability detected Sensory: Unimpaired Muscle strength & Tone: Functionally intact Palpation:  Non-contributory  Lumbar Spine Exam  Inspection: No masses, redness, or swelling Alignment: Symmetrical Functional ROM: Unrestricted ROM Stability: No instability detected Muscle strength & Tone: Functionally intact Sensory: Unimpaired Palpation: Non-contributory Provocative Tests: Lumbar Hyperextension and rotation test: evaluation deferred today       Patrick's Maneuver: evaluation deferred today              Gait & Posture Assessment  Ambulation: Unassisted Gait: Relatively normal for age and body habitus Posture: WNL   Lower Extremity Exam    Side: Right lower extremity  Side: Left lower extremity  Inspection: No masses, redness, swelling, or asymmetry  Inspection: No masses, redness, swelling, or asymmetry  Functional ROM: Unrestricted ROM          Functional ROM: Unrestricted ROM          Muscle strength & Tone: Functionally intact  Muscle strength & Tone: Functionally intact  Sensory: Unimpaired  Sensory: Unimpaired  Palpation: Non-contributory  Palpation: Non-contributory   Assessment  Primary Diagnosis & Pertinent Problem List: The primary encounter diagnosis was Chronic pain syndrome. Diagnoses of Neurogenic pain and Muscle twitching were also pertinent to this visit.  Visit Diagnosis: 1. Chronic pain syndrome   2. Neurogenic pain   3. Muscle twitching    Plan of Care  Pharmacotherapy (Medications Ordered): Meds ordered this encounter  Medications  . morphine (MS CONTIN) 30 MG 12 hr tablet    Sig: Take 1 tablet (30 mg total) by mouth every 12 (twelve) hours.    Dispense:  60 tablet    Refill:  0    Do not place this medication, or any other prescription from our practice, on "Automatic Refill". Patient may have prescription filled one day early if pharmacy is closed on scheduled refill date. Do not fill until: 07/04/16 To last until: 08/03/16  . morphine (MS CONTIN) 30 MG 12 hr tablet    Sig: Take 1 tablet (30 mg total) by mouth every 12 (twelve) hours.     Dispense:  60 tablet    Refill:  0    Do not place this medication, or any other prescription from our practice, on "Automatic Refill". Patient may have prescription filled one day early if pharmacy is closed on scheduled refill date. Do not fill until: 08/03/16 To last until: 09/02/16  . morphine (MS CONTIN) 30 MG 12 hr tablet    Sig: Take 1 tablet (30 mg total) by mouth every  12 (twelve) hours.    Dispense:  60 tablet    Refill:  0    Do not place this medication, or any other prescription from our practice, on "Automatic Refill". Patient may have prescription filled one day early if pharmacy is closed on scheduled refill date. Do not fill until: 09/02/16 To last until: 10/02/16  . gabapentin (NEURONTIN) 300 MG capsule    Sig: Take 3 capsules (900 mg total) by mouth 4 (four) times daily. May go up to 3 capsules 4 times a day. Titrate up slowly.    Dispense:  360 capsule    Refill:  2    Do not place this medication, or any other prescription from our practice, on "Automatic Refill". Patient may have prescription filled one day early if pharmacy is closed on scheduled refill date.   New Prescriptions   No medications on file   Medications administered during this visit: Ms. Kallstrom had no medications administered during this visit. Lab-work, Procedure(s), & Referral(s) Ordered: Orders Placed This Encounter  Procedures  . Ambulatory referral to Neurology   Imaging & Referral(s) Ordered: AMB REFERRAL TO NEUROLOGY. Today the patient showed me a radio taken off her while having some type of involuntary muscle contractions. These were not something that I have seen before and therefore I have decided to do a referral to neurology for further investigation into the matter. One thing that stands up in all of this is that it appears to be primarily when she goes to have physical therapy. I'm not quite sure what to make of that. Because this is not within my field of expertise, I will refer  any further opinions and assessment to somebody with more experience and expertise in this type of problems.  Interventional Therapies: Pending/Scheduled/Planned:   None at this time until we can fear out what this muscle twitches are.    Considering:   Greater occipital nerve radiofrequency ablation.   PRN Procedures:   Left greater occipital nerve block under fluoroscopic guidance, with a without sedation.   Requested PM Follow-up: Return in about 3 months (around 09/12/2016) for Med-Mgmt.  Future Appointments Date Time Provider [place]  06/18/2016 12:00 PM Kirtland Bouchard, PhD [facility] None  06/25/2016 12:00 PM Kirtland Bouchard, PhD [facility] None  07/09/2016 10:00 AM Lady Saucier, RD [facility] NDM  08/19/2016 3:00 PM Pieter Partridge, DO [facility] None  09/12/2016 10:00 AM [name], MD [facility] None   Primary Care Physician: Cari Caraway, MD Location: [facility] Management Facility Note by: Kathlen Brunswick. Dossie Arbour, M.D, DABA, DABAPM, DABPM, DABIPP, FIPP  Pain Score Disclaimer: We use the NRS-11 scale. This is a self-reported, subjective measurement of pain severity with only modest accuracy. It is used primarily to identify changes within a particular patient. It must be understood that outpatient pain scales are significantly less accurate that those used for research, where they can be applied under ideal controlled circumstances with minimal exposure to variables. In reality, the score is likely to be a combination of pain intensity and pain affect, where pain affect describes the degree of emotional arousal or changes in action readiness caused by the sensory experience of pain. Factors such as social and work situation, setting, emotional state, anxiety levels, expectation, and prior pain experience may influence pain perception and show large inter-individual differences that may also be affected by time variables.  Patient instructions provided during this  appointment: Patient Instructions  Pain Management Discharge Instructions  General Discharge Instructions :  If you need to reach your doctor call: Monday-Friday 8:00 am - 4:00 pm at [phone] or toll free [phone].  After clinic hours [phone] to have operator reach doctor.  Bring all of your medication bottles to all your appointments in the pain clinic.  To cancel or reschedule your appointment with Pain Management please remember to call 24 hours in advance to avoid a fee.  Refer to the educational materials which you have been given on: General Risks, I had my Procedure. Discharge Instructions, Post Sedation.  Post Procedure Instructions:  The drugs you were given will stay in your system until tomorrow, so for the next 24 hours you should not drive, make any legal decisions or drink any alcoholic beverages.  You may eat anything you prefer, but it is better to start with liquids then soups and crackers, and gradually work up to solid foods.  Please notify your doctor immediately if you have any unusual bleeding, trouble breathing or pain that is not related to your normal pain.  Depending on the type of procedure that was done, some parts of your body may feel week and/or numb.  This usually clears up by tonight or the next day.  Walk with the use of an assistive device or accompanied by an adult for the 24 hours.  You may use ice on the affected area for the first 24 hours.  Put ice in a Ziploc bag and cover with a towel and place against area 15 minutes on 15 minutes off.  You may switch to heat after 24 hours.

## 2016-06-12 NOTE — Patient Instructions (Signed)

## 2016-06-12 NOTE — Progress Notes (Signed)
Nursing Pain Medication Assessment:  Safety precautions to be maintained throughout the outpatient stay will include: orient to surroundings, keep bed in low position, maintain call bell within reach at all times, provide assistance with transfer out of bed and ambulation.  Medication Inspection Compliance: Pill count conducted under aseptic conditions, in front of the patient. Neither the pills nor the bottle was removed from the patient's sight at any time. Once count was completed pills were immediately returned to the patient in their original bottle.  Medication: See above Pill Count: 43 of 60 pills remain Bottle Appearance: Standard pharmacy container. Clearly labeled. Filled Date: 10 / 24 / 2017 Medication last intake: 1101/17 @ 0900

## 2016-06-18 ENCOUNTER — Ambulatory Visit: Payer: BLUE CROSS/BLUE SHIELD | Admitting: Psychology

## 2016-06-25 ENCOUNTER — Ambulatory Visit (INDEPENDENT_AMBULATORY_CARE_PROVIDER_SITE_OTHER): Payer: BLUE CROSS/BLUE SHIELD | Admitting: Psychology

## 2016-06-25 DIAGNOSIS — G894 Chronic pain syndrome: Secondary | ICD-10-CM | POA: Diagnosis not present

## 2016-06-25 DIAGNOSIS — F411 Generalized anxiety disorder: Secondary | ICD-10-CM

## 2016-07-02 ENCOUNTER — Ambulatory Visit (INDEPENDENT_AMBULATORY_CARE_PROVIDER_SITE_OTHER): Payer: BLUE CROSS/BLUE SHIELD | Admitting: Psychology

## 2016-07-02 DIAGNOSIS — G894 Chronic pain syndrome: Secondary | ICD-10-CM | POA: Diagnosis not present

## 2016-07-02 DIAGNOSIS — F411 Generalized anxiety disorder: Secondary | ICD-10-CM

## 2016-07-09 ENCOUNTER — Encounter: Payer: BLUE CROSS/BLUE SHIELD | Attending: Surgery | Admitting: Dietician

## 2016-07-09 ENCOUNTER — Encounter: Payer: Self-pay | Admitting: Dietician

## 2016-07-09 DIAGNOSIS — Z713 Dietary counseling and surveillance: Secondary | ICD-10-CM | POA: Diagnosis not present

## 2016-07-09 DIAGNOSIS — R635 Abnormal weight gain: Secondary | ICD-10-CM | POA: Diagnosis not present

## 2016-07-09 DIAGNOSIS — Z9884 Bariatric surgery status: Secondary | ICD-10-CM | POA: Insufficient documentation

## 2016-07-09 NOTE — Patient Instructions (Addendum)
Goals: -Maintain a healthy weight: 135-150 lbs -Preserve/improve nutrition status  -Increase weight-bearing physical activity as tolerated (safely, per physician recommendations)  -Include a protein food with each meal and snack: -Yogurt, shrimp, white fish, salmon, crabmeat, boiled eggs, egg salad, Premier protein shake, cheese, chicken, pork, edamame, nuts, beans  -Have a protein shake instead of skipping a meal and/or sip between meals -Thin them out with milk if you need to -Add to your coffee -Freeze and eat with a spoon

## 2016-07-09 NOTE — Progress Notes (Signed)
  Medical Nutrition Therapy:  Appt start time: 1010 end time:  1130   Assessment:  Primary concerns today: Patient referred by bariatric surgeon, Dr. Hassell Done for weight regain following bariatric surgery.  Kari Gibbs is here today with her husband. Had RYGB in 2011 or 2012.  She reports her highest weight was 290 lbs prior to surgery and lowest weight was 120 lbs. States that she was encouraged to "eat anything and everything" to regain weight. Has struggled with depression since RYGB. Chronic left-sided abdominal pain and currently in pain management. Had gallbladder removed and pain did not resolve. Eventually found out she had a cracked rib and damaged ligaments and tendons. Also having some nerve issues and scheduled to see a neurologist in January. Has lost her job and currently applying for disability. Does not sleep well (states she either does not sleep or sleeps too much). Does not have much of an appetite. Reports constipation due to pain medication. Not having much vomiting but has acid reflux.   Currently can tolerate about 1 oz of meat at a time.  Cannot tolerate hamburger, pasta, lots of grease/butter, tomato/tomato sauce.    Weight today: 171.3 lbs BMI: 27.65   Goal weight: 135-150 lbs  Preferred Learning Style:   No preference indicated   Learning Readiness:   Ready   MEDICATIONS: see list, taking prenatal, vitamin D, and Calcium   DIETARY INTAKE:  24-hr recall:   Wakes up around 8am.  B (8-8:30 AM): 1-2 pack Quaker Oats breakfast flats (3-6g protein), decaf coffee with hot chocolate packet Snk ( AM): none  L (12-1 PM): breakfast flats and fruit  Snk ( PM): none D (6-7 PM): salad with raw vegetables, 1 oz meat  Snk ( PM): ice cream   Eats Xylitol mints throughout the day for dry mouth.   Beverages: decaf coffee, Starbucks mocha frappucino, sweetened decaf green tea, water, Coke   Usual physical activity: limited due to balance and strength issues  Estimated  energy needs: 1400-1600 calories 158-170 g carbohydrates 105-112 g protein 39-42 g fat  Progress Towards Goal(s):  In progress.   Nutritional Diagnosis:  NI-5.7.1 Inadequate protein intake As related to patient prefers high carbohydrate foods.  As evidenced by diet recall and patient report of muscle weakness.    Intervention:  Nutrition counseling and education provided. Goals: -Maintain a healthy weight: 135-150 lbs -Preserve/improve nutrition status  -Increase weight-bearing physical activity as tolerated (safely, per physician recommendations)  -Include a protein food with each meal and snack: -Yogurt, shrimp, white fish, salmon, crabmeat, boiled eggs, egg salad, Premier protein shake, cheese, chicken, pork, edamame, nuts, beans  -Have a protein shake instead of skipping a meal and/or sip between meals -Thin them out with milk if you need to -Add to your coffee -Freeze and eat with a spoon  Teaching Method Utilized:  Visual Auditory Hands on  Handouts given during visit include:  Pre op diet (per patient request)  Barriers to learning/adherence to lifestyle change: chronic pain and physical limitations  Demonstrated degree of understanding via:  Teach Back   Monitoring/Evaluation:  Dietary intake, exercise, and body weight in 2 month(s).

## 2016-07-11 ENCOUNTER — Telehealth: Payer: Self-pay

## 2016-07-11 NOTE — Telephone Encounter (Signed)
Spoke with patient about medication concerns and agree that she needs an appointment with Dr. Dossie Arbour to discuss concerns and POC. Patient has been put on cancellation list for appointment ASAP. Understands plan.

## 2016-07-11 NOTE — Telephone Encounter (Signed)
Call patient regarding medication

## 2016-07-11 NOTE — Telephone Encounter (Signed)
She wants a nurse to call her regarding her medication not working. I told her we would need to put her on the waiting list for cancellations to have an appointment, she agreed to this but still wants to speak with someone.

## 2016-07-16 ENCOUNTER — Encounter: Payer: Self-pay | Admitting: Pain Medicine

## 2016-07-16 ENCOUNTER — Ambulatory Visit: Payer: BLUE CROSS/BLUE SHIELD | Attending: Pain Medicine | Admitting: Pain Medicine

## 2016-07-16 VITALS — BP 121/67 | HR 71 | Temp 98.2°F | Resp 16 | Ht 66.0 in | Wt 167.0 lb

## 2016-07-16 DIAGNOSIS — G894 Chronic pain syndrome: Secondary | ICD-10-CM | POA: Insufficient documentation

## 2016-07-16 DIAGNOSIS — R1012 Left upper quadrant pain: Secondary | ICD-10-CM | POA: Diagnosis not present

## 2016-07-16 DIAGNOSIS — I10 Essential (primary) hypertension: Secondary | ICD-10-CM | POA: Diagnosis not present

## 2016-07-16 DIAGNOSIS — M5481 Occipital neuralgia: Secondary | ICD-10-CM | POA: Diagnosis not present

## 2016-07-16 DIAGNOSIS — Z79899 Other long term (current) drug therapy: Secondary | ICD-10-CM | POA: Insufficient documentation

## 2016-07-16 NOTE — Progress Notes (Signed)
Nursing Pain Medication Assessment:  Safety precautions to be maintained throughout the outpatient stay will include: orient to surroundings, keep bed in low position, maintain call bell within reach at all times, provide assistance with transfer out of bed and ambulation.  Medication Inspection Compliance: Pill count conducted under aseptic conditions, in front of the patient. Neither the pills nor the bottle was removed from the patient's sight at any time. Once count was completed pills were immediately returned to the patient in their original bottle.  Medication: Morphine ER (MSContin) Pill Count: 36 of 60 pills remain Bottle Appearance: Standard pharmacy container. Clearly labeled. Filled Date: 33 / 23 / 2017 Medication last intake:  07-15-16 at 2130

## 2016-07-16 NOTE — Progress Notes (Signed)
Patient's Name: Kari Gibbs  MRN: [id number]  Referring Provider: Cari Caraway, MD  DOB: 1951-06-20  PCP: Cari Caraway, MD  DOS: 07/16/2016  Note by: Kathlen Brunswick. Dossie Arbour, MD  Service setting: Ambulatory outpatient  Specialty: Interventional Pain Management  Location: [facility] (AMB) Pain Management Facility    Patient type: Established   Primary Reason(s) for Visit: Evaluation of chronic illnesses with exacerbation, or progression (Level of risk: moderate) CC: Abdominal Pain (luq)  HPI  Kari Gibbs is a 65 y.o. year old, female patient, who comes today for a follow-up evaluation. 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; S/P laparoscopic cholecystectomy Oct 2014; Fracture of rib of left side; Chronic pain; Long term current use of opiate analgesic; Long term prescription opiate use; Opiate use (60 MME/day); Encounter for therapeutic drug level monitoring; Opiate dependence ([place]); Encounter for chronic pain management; Chronic pain syndrome; Chronic flank pain (Location of Primary Source of Pain) (Left); Chronic thoracic radicular pain (Left-sided); Opioid-induced constipation (OIC); Occipital neuralgia of left side; Neuropathic pain; Neurogenic pain; History of rib fracture (Left 4th and 8th rib); Vertebral body hemangioma (T11); Metatarsalgia of right foot; Chronic abdominal pain (Bilateral) (L>R) (possible left-sided thoracic radiculopathy); Osteoarthritis; Return to work evaluation (FCE: Medium); Varicose veins of bilateral lower extremities with other complications; Varicose veins of right lower extremity with complications; Chronic neck pain (B) (L>R); Cervical facet syndrome (B) (L>R); Medication side effect; Muscle spasticity; Tremors of nervous system; and Muscle twitching on her problem list. Kari Gibbs was last seen on 06/12/2016. Her primarily concern today is the  Abdominal Pain (luq)  Pain Assessment: Self-Reported Pain Score: 4 /10             Reported level is compatible with observation.       Pain Type: Chronic pain Pain Location: Abdomen Pain Orientation: Left, Upper Pain Descriptors / Indicators: Dull Pain Frequency: Intermittent  The patient comes in today with a flareup of her left greater occipital neuralgia, which at this point is localized in the left posterior aspect of her neck and occipital region. We will schedule her to come in for a left occipital nerve block under fluoroscopic guidance and IV sedation. She had her first diagnostic on 11/02/2015 and it provided her with 100% relief of the pain for the duration of local anesthetic followed by an 85% long-term benefit. She was doing rather well until she attempted to go to physical therapy which learn things up, suggesting that the underlying cause is facet joint arthropathy.  Further details on both, my assessment(s), as well as the proposed treatment plan, please see below.  Laboratory Chemistry  Inflammation Markers Lab Results  Component Value Date   ESRSEDRATE 17 01/04/2016   CRP <0.5 01/04/2016   Renal Function Lab Results  Component Value Date   BUN 20 01/04/2016   CREATININE 0.76 01/04/2016   GFRAA >60 01/04/2016   GFRNONAA >60 01/04/2016   Hepatic Function Lab Results  Component Value Date   AST 33 01/04/2016   ALT 26 01/04/2016   ALBUMIN 4.2 01/04/2016   Electrolytes Lab Results  Component Value Date   NA 139 01/04/2016   K 4.6 01/04/2016   CL 99 (L) 01/04/2016   CALCIUM 9.7 01/04/2016   MG 2.1 01/04/2016   Pain Modulating Vitamins Lab Results  Component Value Date   25OHVITD1 49 01/04/2016   25OHVITD2 25 01/04/2016   25OHVITD3 24 01/04/2016   VITAMINB12  321 01/04/2016   Coagulation Parameters Lab Results  Component Value Date   PLT 238 11/18/2014   Cardiovascular Lab Results  Component Value Date   HGB 11.1 (L) 11/18/2014   HCT 34.1 (L)  11/18/2014   Note: Lab results reviewed.  Recent Diagnostic Imaging Review  Dg Cervical Spine Complete  Result Date: 03/27/2016 CLINICAL DATA:  Popping noise along the left side of the neck with head rotation. No known injury. EXAM: CERVICAL SPINE - COMPLETE 4+ VIEW COMPARISON:  None. FINDINGS: No fracture.  No spondylolisthesis. Moderate loss disc height at C4-C5 through C6-C7. Small endplate spurs are noted at these levels. There is no significant neural foraminal narrowing. Bones are demineralized. Soft tissues are unremarkable. IMPRESSION: 1. No fracture, spondylolisthesis or acute finding. 2. 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 Electronically Signed   By: Lajean Manes M.D.   On: 03/27/2016 12:54   Cervical Imaging: Cervical DG complete:  Results for orders placed during the hospital encounter of 03/27/16  DG Cervical Spine Complete   Narrative CLINICAL DATA:  Popping noise along the left side of the neck with head rotation. No known injury.  EXAM: CERVICAL SPINE - COMPLETE 4+ VIEW  COMPARISON:  None.  FINDINGS: No fracture.  No spondylolisthesis.  Moderate loss disc height at C4-C5 through C6-C7. Small endplate spurs are noted at these levels. There is no significant neural foraminal narrowing.  Bones are demineralized.  Soft tissues are unremarkable.  IMPRESSION: 1. No fracture, spondylolisthesis or acute finding. 2. 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   Electronically Signed   By: Lajean Manes M.D.   On: 03/27/2016 12:54    Thoracic Imaging: Thoracic MR wo contrast:  Results for orders placed during the hospital encounter of 08/26/13  MR Thoracic Spine Wo Contrast   Addendum ADDENDUM REPORT: 09/11/2013 11:20  ADDENDUM: The original report was by Dr. Ivar Drape. The following addendum is by Dr. Van Clines:  I received a phone call from Dr. Theadore Nan at 11:10 a.m. on 09/11/2013 regarding this patient who has left lower  chest pain localizing to left lateral/anterolateral rib pain.  Reviewing the images in comparison to the exam from 01/26/2013 in light of this specific localization, there is a subtle band of sclerosis in the left anterolateral eighth rib on images 11 through 14 of series 3. This is not apparent on the June exam and in light of the patient's point tenderness at this location, is most compatible with a healing nondisplaced fracture. I have surveyed the rest of the skeleton on that exam and do not observe other fractures or significant abnormalities in this vicinity.   Electronically Signed   By: Sherryl Barters M.D.   On: 09/11/2013 11:20       Sherryl Barters, MD 09/11/2013 11:22 AM         Narrative CLINICAL DATA:  Rib pain.  Left flank pain.  EXAM: MRI THORACIC SPINE WITHOUT CONTRAST  TECHNIQUE: Multiplanar, multisequence MR imaging was performed. No intravenous contrast was administered.  COMPARISON:  None.  FINDINGS: Negative for thoracic fracture or mass lesion. Hemangioma T11 vertebral body. Spinal cord signal is normal. No syrinx or mass in the cord.  Negative for spinal stenosis. No cord compression. Disc spaces are maintained and there is no significant degenerative change. Negative for disc protrusion.  IMPRESSION: Negative  Electronically Signed: By: Franchot Gallo M.D. On: 08/26/2013 20:14    Note: Imaging results reviewed.  Meds  The patient has a current medication list which includes the following prescription(s): buspirone, chlorpheniramine maleate, cholecalciferol, ferrous sulfate, gabapentin, magnesium, metronidazole, morphine, morphine, morphine, naloxone, omeprazole, potassium gluconate, premarin, prenatal vit-fe fum-fa-omega, align, teriparatide (recombinant), triamcinolone cream, valsartan, venlafaxine xr, and benefiber.  Current Outpatient Prescriptions on File Prior to Visit  Medication Sig  . busPIRone (BUSPAR) 15 MG tablet TAKE 1/3 TO 1/2 TAB AS  NEEDED FOR ANXIETY UP TO TWICE A DAY  . CHLORPHENIRAMINE MALEATE PO Take 4 mg by mouth every 6 (six) hours as needed.  . cholecalciferol (VITAMIN D) 1000 units tablet Take 4,000 Units by mouth daily.  . ferrous sulfate 325 (65 FE) MG tablet Take 325 mg by mouth daily with breakfast.  . gabapentin (NEURONTIN) 300 MG capsule Take 3 capsules (900 mg total) by mouth 4 (four) times daily. May go up to 3 capsules 4 times a day. Titrate up slowly.  . Magnesium 500 MG TABS Take 1 tablet by mouth 2 (two) times daily.  . metroNIDAZOLE (METROCREAM) 0.75 % cream APPLY ON THE SKIN AS DIRECTED. APPLY TO AFFECTED AREAS DAILY AS NEEDED.  Marland Kitchen morphine (MS CONTIN) 30 MG 12 hr tablet Take 1 tablet (30 mg total) by mouth every 12 (twelve) hours.  Derrill Memo ON 08/03/2016] morphine (MS CONTIN) 30 MG 12 hr tablet Take 1 tablet (30 mg total) by mouth every 12 (twelve) hours.  Derrill Memo ON 09/02/2016] morphine (MS CONTIN) 30 MG 12 hr tablet Take 1 tablet (30 mg total) by mouth every 12 (twelve) hours.  . naloxone (NARCAN) 2 MG/2ML injection Inject content of syringe into thigh muscle. Call 911.  . omeprazole (PRILOSEC) 20 MG capsule Take 20 mg by mouth 2 (two) times daily.  . potassium gluconate 595 MG TABS tablet Take 595 mg by mouth daily as needed (low potassium).  Marland Kitchen PREMARIN vaginal cream INSERT 1GRAM VAGINALLY 3 TIMES WEEKLY AT BEDTIME  . PRENATAL VIT-FE FUM-FA-OMEGA PO Take 1 tablet by mouth daily.  . Probiotic Product (ALIGN) 4 MG CAPS Take 1 capsule by mouth at bedtime. Per Pt.  . Teriparatide, Recombinant, (FORTEO) 600 MCG/2.4ML SOLN Inject 20 mcg into the skin daily.  Marland Kitchen triamcinolone cream (KENALOG) 0.1 % ON THE SKIN TWICE A DAY. APPLY TO AFFECTED AREAS TWICE A DAY AS NEEDED.  Marland Kitchen valsartan (DIOVAN) 40 MG tablet Take 40 mg by mouth every morning.  . venlafaxine XR (EFFEXOR-XR) 75 MG 24 hr capsule Take 225 mg by mouth every morning.  . Wheat Dextrin (BENEFIBER) POWD Stir 2 teaspoons of Benefiber into 4-8 oz of any  non-carbonated beverage or soft food (hot or cold) TID.   No current facility-administered medications on file prior to visit.    ROS  Constitutional: Denies any fever or chills Gastrointestinal: No reported hemesis, hematochezia, vomiting, or acute GI distress Musculoskeletal: Denies any acute onset joint swelling, redness, loss of ROM, or weakness Neurological: No reported episodes of acute onset apraxia, aphasia, dysarthria, agnosia, amnesia, paralysis, loss of coordination, or loss of consciousness  Allergies  Ms. Dacosta is allergic to iohexol; ace inhibitors; losartan potassium; and neosporin [neomycin-bacitracin zn-polymyx].  [place]  Drug: Ms. Wollam  reports that she does not use drugs. Alcohol:  reports that she does not drink alcohol. Tobacco:  reports that she has never smoked. She has never used smokeless tobacco. 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 Family: family history includes Alzheimer's disease in her father; Hypertension in her mother.  Past Surgical History:  Procedure Laterality Date  . ABDOMINAL HYSTERECTOMY  1988   partial  . CHOLECYSTECTOMY N/A 05/18/2013   Procedure: LAPAROSCOPIC CHOLECYSTECTOMY WITH INTRAOPERATIVE CHOLANGIOGRAM/laparoscopy/ ;  Surgeon: Pedro Earls, MD;  Location: [facility];  Service: General;  Laterality: N/A;  . EXPLORATORY LAPAROTOMY  2012  . GASTRIC BYPASS  11/13/2009  . HERNIA REPAIR  2011   hiatal  . 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 TMJ- LEFT--ABOUT 10 YRS AGO--LEFT SIDE--PT NOW HAS SOME PAIN LEFT JAW Left    still has pain, scar tissue has been removed   Constitutional Exam  General appearance: Well nourished, well developed, and well hydrated. In no apparent acute distress Vitals:   07/16/16 0822  BP: 121/67  Pulse: 71  Resp: 16  Temp: 98.2 F (36.8 C)  TempSrc: Oral  SpO2: 96%  Weight: 167 lb (75.8 kg)  Height: 5\' 6"  (1.676 m)   BMI Assessment: Estimated body mass index is 26.95 kg/m as calculated from the following:   Height as of this encounter: 5\' 6"  (1.676 m).   Weight as of this encounter: 167 lb (75.8 kg).  BMI interpretation table: BMI level Category Range association with higher incidence of chronic pain  <18 kg/m2 Underweight   18.5-24.9 kg/m2 Ideal body weight   25-29.9 kg/m2 Overweight Increased incidence by 20%  30-34.9 kg/m2 Obese (Class I) Increased incidence by 68%  35-39.9 kg/m2 Severe obesity (Class II) Increased incidence by 136%  >40 kg/m2 Extreme obesity (Class III) Increased incidence by 254%   BMI Readings from Last 4 Encounters:  07/16/16 26.95 kg/m  07/09/16 27.65 kg/m  06/12/16 27.76 kg/m  04/29/16 25.82 kg/m   Wt Readings from Last 4 Encounters:  07/16/16 167 lb (75.8 kg)  07/09/16 171 lb 4.8 oz (77.7 kg)  06/12/16 172 lb (78 kg)  04/29/16 160 lb (72.6 kg)  Psych/Mental status: Alert, oriented x 3 (person, place, & time) Eyes: PERLA Respiratory: No evidence of acute respiratory distress  Cervical Spine Exam  Inspection: No masses, redness, or swelling Alignment: Symmetrical Functional ROM: Unrestricted ROM Stability: No instability detected Muscle strength & Tone: Functionally intact Sensory: Unimpaired Palpation: Non-contributory  Upper Extremity (UE) Exam    Side: Right upper extremity  Side: Left upper extremity  Inspection: No masses, redness, swelling, or asymmetry   Inspection: No masses, redness, swelling, or asymmetry  Functional ROM: Unrestricted ROM          Functional ROM: Unrestricted ROM          Muscle strength & Tone: Functionally intact  Muscle strength & Tone: Functionally intact  Sensory: Unimpaired  Sensory: Unimpaired  Palpation: Non-contributory  Palpation: Non-contributory   Thoracic Spine Exam  Inspection: No masses, redness, or swelling Alignment: Symmetrical Functional ROM: Unrestricted ROM Stability: No instability detected Sensory: Unimpaired Muscle strength & Tone: Functionally intact Palpation: Non-contributory  Lumbar Spine Exam  Inspection: No masses, redness, or swelling Alignment: Symmetrical Functional ROM: Unrestricted ROM Stability: No instability detected Muscle strength & Tone: Functionally intact Sensory: Unimpaired Palpation: Non-contributory Provocative Tests: Lumbar Hyperextension and rotation test: evaluation deferred  today       Patrick's Maneuver: evaluation deferred today              Gait & Posture Assessment  Ambulation: Unassisted Gait: Relatively normal for age and body habitus Posture: WNL   Lower Extremity Exam    Side: Right lower extremity  Side: Left lower extremity  Inspection: No masses, redness, swelling, or asymmetry  Inspection: No masses, redness, swelling, or asymmetry  Functional ROM: Unrestricted ROM          Functional ROM: Unrestricted ROM          Muscle strength & Tone: Functionally intact  Muscle strength & Tone: Functionally intact  Sensory: Unimpaired  Sensory: Unimpaired  Palpation: Non-contributory  Palpation: Non-contributory   Assessment  Primary Diagnosis & Pertinent Problem List: The primary encounter diagnosis was Occipital neuralgia of left side. A diagnosis of Chronic pain syndrome was also pertinent to this visit.  Visit Diagnosis: 1. Occipital neuralgia of left side   2. Chronic pain syndrome    Plan of Care  Pharmacotherapy (Medications Ordered): No  orders of the defined types were placed in this encounter.  New Prescriptions   No medications on file   Medications administered today: Ms. Bew had no medications administered during this visit. Lab-work, procedure(s), and/or referral(s): Orders Placed This Encounter  Procedures  . GREATER OCCIPITAL NERVE BLOCK   Imaging and/or referral(s): None  Interventional therapies: Planned, scheduled, and/or pending:   Diagnostic left greater occipital nerve under fluoroscopic guidance and IV sedation    Considering:   Diagnostic left greater occipital nerve block under fluoroscopic guidance and IV sedation  Diagnostic left cervical facet block under fluoroscopic guidance and IV sedation  Possible left C2 + TON diagnostic block  Possible left C2 + TON RFA  Possible left greater occipital nerve RFA    Palliative PRN treatment(s):   Not at this time.   Provider-requested follow-up: Return for procedure (ASAP).  Future Appointments Date Time Provider [place]  08/19/2016 3:00 PM Pieter Partridge, DO [facility] None  09/12/2016 10:00 AM [name], MD [facility] None  09/17/2016 10:00 AM Lady Saucier, RD [facility]   Primary Care Physician: Cari Caraway, MD Location: [facility] Note by: Kathlen Brunswick. Dossie Arbour, M.D, DABA, DABAPM, DABPM, DABIPP, FIPP Date: 07/16/16; Time: 9:36 AM  Pain Score Disclaimer: We use the NRS-11 scale. This is a self-reported, subjective measurement of pain severity with only modest accuracy. It is used primarily to identify changes within a particular patient. It must be understood that outpatient pain scales are significantly less accurate that those used for research, where they can be applied under ideal controlled circumstances with minimal exposure to variables. In reality, the score is likely to be a combination of pain intensity and pain affect, where pain affect describes the degree of emotional arousal or  changes in action readiness caused by the sensory experience of pain. Factors such as social and work situation, setting, emotional state, anxiety levels, expectation, and prior pain experience may influence pain perception and show large inter-individual differences that may also be affected by time variables.  Patient instructions provided during this appointment: Patient Instructions  Occipital Nerve Block Patient Information  Description: The occipital nerves originate in the cervical (neck) spinal cord and travel upward through muscle and tissue to supply sensation to the back of the head and top of the scalp.  In addition, the nerves control some of the muscles of the scalp.  Occipital neuralgia  is an irritation of these nerves which can cause headaches, numbness of the scalp, and neck discomfort.     The occipital nerve block will interrupt nerve transmission through these nerves and can relieve pain and spasm.  The block consists of insertion of a small needle under the skin in the back of the head to deposit local anesthetic (numbing medicine) and/or steroids around the nerve.  The entire block usually lasts less than 5 minutes.  Conditions which may be treated by occipital blocks:   Muscular pain and spasm of the scalp  Nerve irritation, back of the head  Headaches  Upper neck pain  Preparation for the injection:  1. Do not eat any solid food or dairy products within 8 hours of your appointment. 2. You may drink clear liquids up to 3 hours before appointment.  Clear liquids include water, black coffee, juice or soda.  No milk or cream please. 3. You may take your regular medication, including pain medications, with a sip of water before you appointment.  Diabetics should hold regular insulin (if taken separately) and take 1/2 normal NPH dose the morning of the procedure.  Carry some sugar containing items with you to your appointment. 4. A driver must accompany you and be prepared  to drive you home after your procedure. 5. Bring all your current medications with you. 6. An IV may be inserted and sedation may be given at the discretion of the physician. 7. A blood pressure cuff, EKG, and other monitors will often be applied during the procedure.  Some patients may need to have extra oxygen administered for a short period. 8. You will be asked to provide medical information, including your allergies and medications, prior to the procedure.  We must know immediately if you are taking blood thinners (like Coumadin/Warfarin) or if you are allergic to IV iodine contrast (dye).  We must know if you could possible be pregnant.  9. Do not wear a high collared shirt or turtleneck.  Tie long hair up in the back if possible.  Possible side-effects:   Bleeding from needle site  Infection (rare, may require surgery)  Nerve injury (rare)  Hair on back of neck can be tinged with iodine scrub (this will wash out)  Light-headedness (temporary)  Pain at injection site (several days)  Decreased blood pressure (rare, temporary)  Seizure (very rare)  Call if you experience:   Hives or difficulty breathing ( go to the emergency room)  Inflammation or drainage at the injection site(s)  Please note:  Although the local anesthetic injected can often make your painful muscles or headache feel good for several hours after the injection, the pain may return.  It takes 3-7 days for steroids to work.  You may not notice any pain relief for at least one week.  If effective, we will often do a series of injections spaced 3-6 weeks apart to maximally decrease your pain.  If you have any questions, please call [phone] [facility]

## 2016-07-16 NOTE — Patient Instructions (Signed)
Occipital Nerve Block Patient Information  Description: The occipital nerves originate in the cervical (neck) spinal cord and travel upward through muscle and tissue to supply sensation to the back of the head and top of the scalp.  In addition, the nerves control some of the muscles of the scalp.  Occipital neuralgia is an irritation of these nerves which can cause headaches, numbness of the scalp, and neck discomfort.     The occipital nerve block will interrupt nerve transmission through these nerves and can relieve pain and spasm.  The block consists of insertion of a small needle under the skin in the back of the head to deposit local anesthetic (numbing medicine) and/or steroids around the nerve.  The entire block usually lasts less than 5 minutes.  Conditions which may be treated by occipital blocks:   Muscular pain and spasm of the scalp  Nerve irritation, back of the head  Headaches  Upper neck pain  Preparation for the injection:  1. Do not eat any solid food or dairy products within 8 hours of your appointment. 2. You may drink clear liquids up to 3 hours before appointment.  Clear liquids include water, black coffee, juice or soda.  No milk or cream please. 3. You may take your regular medication, including pain medications, with a sip of water before you appointment.  Diabetics should hold regular insulin (if taken separately) and take 1/2 normal NPH dose the morning of the procedure.  Carry some sugar containing items with you to your appointment. 4. A driver must accompany you and be prepared to drive you home after your procedure. 5. Bring all your current medications with you. 6. An IV may be inserted and sedation may be given at the discretion of the physician. 7. A blood pressure cuff, EKG, and other monitors will often be applied during the procedure.  Some patients may need to have extra oxygen administered for a short period. 8. You will be asked to provide medical  information, including your allergies and medications, prior to the procedure.  We must know immediately if you are taking blood thinners (like Coumadin/Warfarin) or if you are allergic to IV iodine contrast (dye).  We must know if you could possible be pregnant.  9. Do not wear a high collared shirt or turtleneck.  Tie long hair up in the back if possible.  Possible side-effects:   Bleeding from needle site  Infection (rare, may require surgery)  Nerve injury (rare)  Hair on back of neck can be tinged with iodine scrub (this will wash out)  Light-headedness (temporary)  Pain at injection site (several days)  Decreased blood pressure (rare, temporary)  Seizure (very rare)  Call if you experience:   Hives or difficulty breathing ( go to the emergency room)  Inflammation or drainage at the injection site(s)  Please note:  Although the local anesthetic injected can often make your painful muscles or headache feel good for several hours after the injection, the pain may return.  It takes 3-7 days for steroids to work.  You may not notice any pain relief for at least one week.  If effective, we will often do a series of injections spaced 3-6 weeks apart to maximally decrease your pain.  If you have any questions, please call [phone] [facility]  

## 2016-07-17 ENCOUNTER — Encounter: Payer: Self-pay | Admitting: Pain Medicine

## 2016-07-17 ENCOUNTER — Ambulatory Visit (HOSPITAL_BASED_OUTPATIENT_CLINIC_OR_DEPARTMENT_OTHER): Payer: BLUE CROSS/BLUE SHIELD | Admitting: Pain Medicine

## 2016-07-17 ENCOUNTER — Ambulatory Visit
Admission: RE | Admit: 2016-07-17 | Discharge: 2016-07-17 | Disposition: A | Discharge status: Home / Self Care | Payer: BLUE CROSS/BLUE SHIELD | Source: Ambulatory Visit | Attending: Pain Medicine | Admitting: Pain Medicine

## 2016-07-17 VITALS — BP 115/55 | HR 72 | Temp 98.1°F | Resp 16 | Ht 66.0 in | Wt 167.0 lb

## 2016-07-17 DIAGNOSIS — M542 Cervicalgia: Secondary | ICD-10-CM | POA: Diagnosis not present

## 2016-07-17 DIAGNOSIS — M47812 Spondylosis without myelopathy or radiculopathy, cervical region: Secondary | ICD-10-CM | POA: Insufficient documentation

## 2016-07-17 DIAGNOSIS — M5481 Occipital neuralgia: Secondary | ICD-10-CM | POA: Diagnosis not present

## 2016-07-17 DIAGNOSIS — M1288 Other specific arthropathies, not elsewhere classified, other specified site: Secondary | ICD-10-CM | POA: Diagnosis not present

## 2016-07-17 DIAGNOSIS — G8929 Other chronic pain: Secondary | ICD-10-CM

## 2016-07-17 MED ORDER — ROPIVACAINE HCL 2 MG/ML IJ SOLN
4.0000 mL | Freq: Once | INTRAMUSCULAR | Status: AC
  Administered 2016-07-17: 4 mL
  Filled 2016-07-17: qty 10

## 2016-07-17 MED ORDER — METHYLPREDNISOLONE ACETATE 80 MG/ML IJ SUSP
80.0000 mg | Freq: Once | INTRAMUSCULAR | Status: AC
  Administered 2016-07-17: 80 mg
  Filled 2016-07-17: qty 1

## 2016-07-17 MED ORDER — FENTANYL CITRATE (PF) 100 MCG/2ML IJ SOLN
25.0000 ug | INTRAMUSCULAR | Status: DC | PRN
  Administered 2016-07-17: 50 ug via INTRAVENOUS
  Filled 2016-07-17: qty 2

## 2016-07-17 MED ORDER — LACTATED RINGERS IV SOLN: 1000.0000 mL | Freq: Once | INTRAVENOUS | Status: DC

## 2016-07-17 MED ORDER — MIDAZOLAM HCL 5 MG/5ML IJ SOLN
1.0000 mg | INTRAMUSCULAR | Status: DC | PRN
  Administered 2016-07-17: 1 mg via INTRAVENOUS
  Filled 2016-07-17: qty 5

## 2016-07-17 MED ORDER — LIDOCAINE HCL (PF) 1 % IJ SOLN
10.0000 mL | Freq: Once | INTRAMUSCULAR | Status: DC
  Filled 2016-07-17: qty 10

## 2016-07-17 NOTE — Progress Notes (Signed)
Safety precautions to be maintained throughout the outpatient stay will include: orient to surroundings, keep bed in low position, maintain call bell within reach at all times, provide assistance with transfer out of bed and ambulation.  

## 2016-07-17 NOTE — Patient Instructions (Signed)
Occipital Nerve Block Patient Information  Description: The occipital nerves originate in the cervical (neck) spinal cord and travel upward through muscle and tissue to supply sensation to the back of the head and top of the scalp.  In addition, the nerves control some of the muscles of the scalp.  Occipital neuralgia is an irritation of these nerves which can cause headaches, numbness of the scalp, and neck discomfort.     The occipital nerve block will interrupt nerve transmission through these nerves and can relieve pain and spasm.  The block consists of insertion of a small needle under the skin in the back of the head to deposit local anesthetic (numbing medicine) and/or steroids around the nerve.  The entire block usually lasts less than 5 minutes.  Conditions which may be treated by occipital blocks:   Muscular pain and spasm of the scalp  Nerve irritation, back of the head  Headaches  Upper neck pain  Preparation for the injection:  1. Do not eat any solid food or dairy products within 8 hours of your appointment. 2. You may drink clear liquids up to 3 hours before appointment.  Clear liquids include water, black coffee, juice or soda.  No milk or cream please. 3. You may take your regular medication, including pain medications, with a sip of water before you appointment.  Diabetics should hold regular insulin (if taken separately) and take 1/2 normal NPH dose the morning of the procedure.  Carry some sugar containing items with you to your appointment. 4. A driver must accompany you and be prepared to drive you home after your procedure. 5. Bring all your current medications with you. 6. An IV may be inserted and sedation may be given at the discretion of the physician. 7. A blood pressure cuff, EKG, and other monitors will often be applied during the procedure.  Some patients may need to have extra oxygen administered for a short period. 8. You will be asked to provide medical  information, including your allergies and medications, prior to the procedure.  We must know immediately if you are taking blood thinners (like Coumadin/Warfarin) or if you are allergic to IV iodine contrast (dye).  We must know if you could possible be pregnant.  9. Do not wear a high collared shirt or turtleneck.  Tie long hair up in the back if possible.  Possible side-effects:   Bleeding from needle site  Infection (rare, may require surgery)  Nerve injury (rare)  Hair on back of neck can be tinged with iodine scrub (this will wash out)  Light-headedness (temporary)  Pain at injection site (several days)  Decreased blood pressure (rare, temporary)  Seizure (very rare)  Call if you experience:   Hives or difficulty breathing ( go to the emergency room)  Inflammation or drainage at the injection site(s)  Please note:  Although the local anesthetic injected can often make your painful muscles or headache feel good for several hours after the injection, the pain may return.  It takes 3-7 days for steroids to work.  You may not notice any pain relief for at least one week.  If effective, we will often do a series of injections spaced 3-6 weeks apart to maximally decrease your pain.  If you have any questions, please call [phone] [facility] Pain Management Discharge Instructions  General Discharge Instructions :  If you need to reach your doctor call: Monday-Friday 8:00 am - 4:00 pm at [phone] or  toll free [phone].  After clinic hours [phone] to have operator reach doctor.  Bring all of your medication bottles to all your appointments in the pain clinic.  To cancel or reschedule your appointment with Pain Management please remember to call 24 hours in advance to avoid a fee.  Refer to the educational materials which you have been given on: General Risks, I had my Procedure. Discharge Instructions, Post  Sedation.  Post Procedure Instructions:  The drugs you were given will stay in your system until tomorrow, so for the next 24 hours you should not drive, make any legal decisions or drink any alcoholic beverages.  You may eat anything you prefer, but it is better to start with liquids then soups and crackers, and gradually work up to solid foods.  Please notify your doctor immediately if you have any unusual bleeding, trouble breathing or pain that is not related to your normal pain.  Depending on the type of procedure that was done, some parts of your body may feel week and/or numb.  This usually clears up by tonight or the next day.  Walk with the use of an assistive device or accompanied by an adult for the 24 hours.  You may use ice on the affected area for the first 24 hours.  Put ice in a Ziploc bag and cover with a towel and place against area 15 minutes on 15 minutes off.  You may switch to heat after 24 hours. 

## 2016-07-17 NOTE — Progress Notes (Signed)
Patient's Name: Kari Gibbs  MRN: [id number]  Referring Provider: [name], MD  DOB: 09/14/1950  PCP: Cari Caraway, MD  DOS: 07/17/2016  Note by: Kathlen Brunswick. Dossie Arbour, MD  Service setting: Ambulatory outpatient  Location: [facility] (AMB) Pain Management Facility  Visit type: Procedure  Specialty: Interventional Pain Management  Patient type: Established   Primary Reason for Visit: Interventional Pain Management Treatment. CC: Headache  Procedure:  Anesthesia, Analgesia, Anxiolysis:  Type: Diagnostic, Greater, Occipital Nerve Block Region: Posterolateral Cervical Level: Occipital Ridge   Laterality: Left-Sided  Type: Local Anesthesia with Moderate (Conscious) Sedation Local Anesthetic: Lidocaine 1% Route: Intravenous (IV) IV Access: Secured Sedation: Meaningful verbal contact was maintained at all times during the procedure  Indication(s): Analgesia and Anxiety  Indications: 1. Occipital neuralgia of left side   2. Cervical facet syndrome (Bilateral) (L>R)   3. Chronic neck pain (Bilateral) (L>R)   4. Spondylosis of cervical region without myelopathy or radiculopathy    Pain Score: Pre-procedure: 5 /10 Post-procedure: 0-No pain/10  Pre-Procedure Assessment:  Kari Gibbs is a 65 y.o. (year old), female patient, seen today for interventional treatment. 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 Her primarily concern today is the Headache The primary encounter diagnosis was Occipital neuralgia of left side. Diagnoses of Cervical facet syndrome (Bilateral) (L>R), Chronic neck pain (Bilateral) (L>R), and Spondylosis of cervical region without myelopathy or radiculopathy were also pertinent to this visit.  Pain Type: Chronic  pain Pain Location: Head Pain Orientation:  (back of head) Pain Descriptors / Indicators: Dull, Squeezing Pain Frequency: Constant  Date of Last Visit: 07/16/16 Service Provided on Last Visit: Med Refill  Coagulation Parameters Lab Results  Component Value Date   PLT 238 11/18/2014   Verification of the correct person, correct site (including marking of site), and correct procedure were performed and confirmed by the patient.  Consent: Before the procedure and under the influence of no sedative(s), amnesic(s), or anxiolytics, the patient was informed of the treatment options, risks and possible complications. To fulfill our ethical and legal obligations, as recommended by the American Medical Association's Code of Ethics, I have informed the patient of my clinical impression; the nature and purpose of the treatment or procedure; the risks, benefits, and possible complications of the intervention; the alternatives, including doing nothing; the risk(s) and benefit(s) of the alternative treatment(s) or procedure(s); and the risk(s) and benefit(s) of doing nothing. The patient was provided information about the general risks and possible complications associated with the procedure. These may include, but are not limited to: failure to achieve desired goals, infection, bleeding, organ or nerve damage, allergic reactions, paralysis, and death. In addition, the patient was informed of those risks and complications associated to the procedure, such as failure to decrease pain; infection; bleeding; organ or nerve damage with subsequent damage to sensory, motor, and/or autonomic systems, resulting in permanent pain, numbness, and/or weakness of one or several areas of the body; allergic reactions; (i.e.: anaphylactic reaction); and/or death. Furthermore, the patient was informed of those risks and complications associated with the medications. These include, but are not limited to: allergic reactions  (i.e.: anaphylactic or anaphylactoid reaction(s)); adrenal axis suppression; blood sugar elevation that in diabetics may result in ketoacidosis or comma; water retention that in patients with history of congestive heart failure may result in shortness  of breath, pulmonary edema, and decompensation with resultant heart failure; weight gain; swelling or edema; medication-induced neural toxicity; particulate matter embolism and blood vessel occlusion with resultant organ, and/or nervous system infarction; and/or aseptic necrosis of one or more joints. Finally, the patient was informed that Medicine is not an exact science; therefore, there is also the possibility of unforeseen or unpredictable risks and/or possible complications that may result in a catastrophic outcome. The patient indicated having understood very clearly. We have given the patient no guarantees and we have made no promises. Enough time was given to the patient to ask questions, all of which were answered to the patient's satisfaction. Kari Gibbs has indicated that she wanted to continue with the procedure.  Consent Attestation: I, the ordering provider, attest that I have discussed with the patient the benefits, risks, side-effects, alternatives, likelihood of achieving goals, and potential problems during recovery for the procedure that I have provided informed consent.  Pre-Procedure Preparation:  Safety Precautions: Allergies reviewed. The patient was asked about blood thinners, or active infections, both of which were denied. The patient was asked to confirm the procedure and laterality, before marking the site, and again before commencing the procedure. Appropriate site, procedure, and patient were confirmed by following the Joint Commission's Universal Protocol (UP.01.01.01), in the form of a "Time Out". The patient was asked to participate by confirming the accuracy of the "Time Out" information. Patient was assessed for positional  comfort and pressure points before starting the procedure. Allergies: She is allergic to iohexol; ace inhibitors; losartan potassium; and neosporin [neomycin-bacitracin zn-polymyx]. Allergy Precautions: None required Infection Control Precautions: Sterile technique used. Standard Universal Precautions were taken as recommended by the Department of [facility] for Disease Control and Prevention (CDC). Standard pre-surgical skin prep was conducted. Respiratory hygiene and cough etiquette was practiced. Hand hygiene observed. Safe injection practices and needle disposal techniques followed. SDV (single dose vial) medications used. Medications properly checked for expiration dates and contaminants. Personal protective equipment (PPE) used as per protocol. Monitoring:  As per clinic protocol. Vitals:   07/17/16 0950 07/17/16 1000 07/17/16 1010 07/17/16 1020  BP: (!) 105/56 133/60 (!) 110/55 (!) 115/55  Pulse: 82   72  Resp: 10 14 16 16   Temp:      SpO2: 92% 95% 98% 98%  Weight:      Height:      Calculated BMI: Body mass index is 26.95 kg/m. Time-out: "Time-out" completed before starting procedure, as per protocol.  Imaging Review  Cervical Imaging: Cervical DG complete:  Results for orders placed during the hospital encounter of 03/27/16  DG Cervical Spine Complete   Narrative CLINICAL DATA:  Popping noise along the left side of the neck with head rotation. No known injury.  EXAM: CERVICAL SPINE - COMPLETE 4+ VIEW  COMPARISON:  None.  FINDINGS: No fracture.  No spondylolisthesis.  Moderate loss disc height at C4-C5 through C6-C7. Small endplate spurs are noted at these levels. There is no significant neural foraminal narrowing.  Bones are demineralized.  Soft tissues are unremarkable.  IMPRESSION: 1. No fracture, spondylolisthesis or acute finding. 2. 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   Electronically Signed   By: Lajean Manes M.D.   On:  03/27/2016 12:54    Description of Procedure Process:   Time-out: "Time-out" completed before starting procedure, as per protocol. Position: Prone Target Area: Area medial to the occipital artery at the level of the superior nuchal ridge Approach: Posterior approach Area Prepped: Entire  Posterior Occipital Region Prepping solution: ChloraPrep (2% chlorhexidine gluconate and 70% isopropyl alcohol) Safety Precautions: Aspiration looking for blood return was conducted prior to all injections. At no point did we inject any substances, as a needle was being advanced. No attempts were made at seeking any paresthesias. Safe injection practices and needle disposal techniques used. Medications properly checked for expiration dates. SDV (single dose vial) medications used. Latex Allergy precautions taken.   Description of the Procedure: Protocol guidelines were followed. The target area was identified and the area prepped in the usual manner. Skin & deeper tissues infiltrated with local anesthetic. Appropriate amount of time allowed to pass for local anesthetics to take effect. The procedure needles were then advanced to the target area. Proper needle placement secured. Negative aspiration confirmed. Solution injected in intermittent fashion, asking for systemic symptoms every 0.5cc of injectate. The needles were then removed and the area cleansed, making sure to leave some of the prepping solution back to take advantage of its long term bactericidal properties. EBL: None Materials & Medications:  Needle(s) Type: Epidural needle Gauge: 22G Length: 3.5-in Medication(s): We administered fentaNYL, midazolam, ropivacaine (PF) 2 mg/mL (0.2%), and methylPREDNISolone acetate. Please see chart orders for dosing details.  Imaging Guidance (Non-Spinal):  Type of Imaging Technique: Fluoroscopy Guidance (Non-Spinal) Indication(s): Assistance in needle guidance and placement for procedures requiring needle placement  in or near specific anatomical locations not easily accessible without such assistance. Exposure Time: Please see nurses notes. Contrast: None used. Fluoroscopic Guidance: I was personally present during the use of fluoroscopy. "Tunnel Vision Technique" used to obtain the best possible view of the target area. Parallax error corrected before commencing the procedure. "Direction-depth-direction" technique used to introduce the needle under continuous pulsed fluoroscopy. Once target was reached, antero-posterior, oblique, and lateral fluoroscopic projection used confirm needle placement in all planes. Images permanently stored in EMR. Interpretation: No contrast injected. I personally interpreted the imaging intraoperatively. Adequate needle placement confirmed in multiple planes. Permanent images saved into the patient's record.  Antibiotic Prophylaxis:  Indication(s): No indications identified. Type:  Antibiotics Given (last 72 hours)    None      Post-operative Assessment:  Complications: No immediate post-treatment complications observed by team, or reported by patient. Disposition: The patient tolerated the entire procedure well. A repeat set of vitals were taken after the procedure and the patient was kept under observation following institutional policy, for this type of procedure. Post-procedural neurological assessment was performed, showing return to baseline, prior to discharge. The patient was provided with post-procedure discharge instructions, including a section on how to identify potential problems. Should any problems arise concerning this procedure, the patient was given instructions to immediately contact us, at any time, without hesitation. In any case, we plan to contact the patient by telephone for a follow-up status report regarding this interventional procedure. Comments:  No additional relevant information.  Plan of Care  Discharge to: Discharge home  Medications ordered  for procedure: Meds ordered this encounter  Medications  . fentaNYL (SUBLIMAZE) injection 25-50 mcg    Make sure Narcan is available in the pyxis when using this medication. In the event of respiratory depression (RR< 8/min): Titrate NARCAN (naloxone) in increments of 0.1 to 0.2 mg IV at 2-3 minute intervals, until desired degree of reversal.  . lactated ringers infusion 1,000 mL  . midazolam (VERSED) 5 MG/5ML injection 1-2 mg    Make sure Flumazenil is available in the pyxis when using this medication. If oversedation occurs, administer 0.2 mg IV  over 15 sec. If after 45 sec no response, administer 0.2 mg again over 1 min; may repeat at 1 min intervals; not to exceed 4 doses (1 mg)  . lidocaine (PF) (XYLOCAINE) 1 % injection 10 mL  . ropivacaine (PF) 2 mg/mL (0.2%) (NAROPIN) injection 4 mL  . methylPREDNISolone acetate (DEPO-MEDROL) injection 80 mg   Medications administered: (For more details, see medical record) We administered fentaNYL, midazolam, ropivacaine (PF) 2 mg/mL (0.2%), and methylPREDNISolone acetate. Lab-work, Procedure(s), & Referral(s) Ordered: Orders Placed This Encounter  Procedures  . DG C-Arm 1-60 Min-No Report   Imaging Ordered: No results found for this or any previous visit. New Prescriptions   No medications on file   Physician-requested Follow-up:  Return in about 2 weeks (around 07/31/2016) for Post-Procedure evaluation.  Future Appointments Date Time Provider [place]  08/19/2016 3:00 PM Pieter Partridge, DO [facility] None  09/02/2016 1:15 PM [name], MD [facility] None  09/12/2016 10:00 AM [name], MD [facility] None  09/17/2016 10:00 AM Lady Saucier, RD [place] [facility]   Primary Care Physician: Cari Caraway, MD Location: [facility] Note by: Kathlen Brunswick. Dossie Arbour, M.D, DABA, DABAPM, DABPM, DABIPP, FIPP Date: 07/17/16; Time: 10:55 AM  Disclaimer:  Medicine is not an exact science. The only guarantee in  medicine is that nothing is guaranteed. It is important to note that the decision to proceed with this intervention was based on the information collected from the patient. The Data and conclusions were drawn from the patient's questionnaire, the interview, and the physical examination. Because the information was provided in large part by the patient, it cannot be guaranteed that it has not been purposely or unconsciously manipulated. Every effort has been made to obtain as much relevant data as possible for this evaluation. It is important to note that the conclusions that lead to this procedure are derived in large part from the available data. Always take into account that the treatment will also be dependent on availability of resources and existing treatment guidelines, considered by other Pain Management Practitioners as being common knowledge and practice, at the time of the intervention. For Medico-Legal purposes, it is also important to point out that variation in procedural techniques and pharmacological choices are the acceptable norm. The indications, contraindications, technique, and results of the above procedure should only be interpreted and judged by a Board-Certified Interventional Pain Specialist with extensive familiarity and expertise in the same exact procedure and technique. Attempts at providing opinions without similar or greater experience and expertise than that of the treating physician will be considered as inappropriate and unethical, and shall result in a formal complaint to the state medical board and applicable specialty societies.  Instructions provided at this appointment: Patient Instructions  Occipital Nerve Block Patient Information  Description: The occipital nerves originate in the cervical (neck) spinal cord and travel upward through muscle and tissue to supply sensation to the back of the head and top of the scalp.  In addition, the nerves control some of the muscles  of the scalp.  Occipital neuralgia is an irritation of these nerves which can cause headaches, numbness of the scalp, and neck discomfort.     The occipital nerve block will interrupt nerve transmission through these nerves and can relieve pain and spasm.  The block consists of insertion of a small needle under the skin in the back of the head to deposit local anesthetic (numbing medicine) and/or steroids around the nerve.  The entire block usually  lasts less than 5 minutes.  Conditions which may be treated by occipital blocks:   Muscular pain and spasm of the scalp  Nerve irritation, back of the head  Headaches  Upper neck pain  Preparation for the injection:  1. Do not eat any solid food or dairy products within 8 hours of your appointment. 2. You may drink clear liquids up to 3 hours before appointment.  Clear liquids include water, black coffee, juice or soda.  No milk or cream please. 3. You may take your regular medication, including pain medications, with a sip of water before you appointment.  Diabetics should hold regular insulin (if taken separately) and take 1/2 normal NPH dose the morning of the procedure.  Carry some sugar containing items with you to your appointment. 4. A driver must accompany you and be prepared to drive you home after your procedure. 5. Bring all your current medications with you. 6. An IV may be inserted and sedation may be given at the discretion of the physician. 7. A blood pressure cuff, EKG, and other monitors will often be applied during the procedure.  Some patients may need to have extra oxygen administered for a short period. 8. You will be asked to provide medical information, including your allergies and medications, prior to the procedure.  We must know immediately if you are taking blood thinners (like Coumadin/Warfarin) or if you are allergic to IV iodine contrast (dye).  We must know if you could possible be pregnant.  9. Do not wear a high  collared shirt or turtleneck.  Tie long hair up in the back if possible.  Possible side-effects:   Bleeding from needle site  Infection (rare, may require surgery)  Nerve injury (rare)  Hair on back of neck can be tinged with iodine scrub (this will wash out)  Light-headedness (temporary)  Pain at injection site (several days)  Decreased blood pressure (rare, temporary)  Seizure (very rare)  Call if you experience:   Hives or difficulty breathing ( go to the emergency room)  Inflammation or drainage at the injection site(s)  Please note:  Although the local anesthetic injected can often make your painful muscles or headache feel good for several hours after the injection, the pain may return.  It takes 3-7 days for steroids to work.  You may not notice any pain relief for at least one week.  If effective, we will often do a series of injections spaced 3-6 weeks apart to maximally decrease your pain.  If you have any questions, please call [phone] [facility] Pain [facility] Discharge Instructions  General Discharge Instructions :  If you need to reach your doctor call: Monday-Friday 8:00 am - 4:00 pm at [phone] or toll free [phone].  After clinic hours [phone] to have operator reach doctor.  Bring all of your medication bottles to all your appointments in the pain clinic.  To cancel or reschedule your appointment with Pain Management please remember to call 24 hours in advance to avoid a fee.  Refer to the educational materials which you have been given on: General Risks, I had my Procedure. Discharge Instructions, Post Sedation.  Post Procedure Instructions:  The drugs you were given will stay in your system until tomorrow, so for the next 24 hours you should not drive, make any legal decisions or drink any alcoholic beverages.  You may eat anything you prefer, but it is better to start with liquids then  soups and  crackers, and gradually work up to solid foods.  Please notify your doctor immediately if you have any unusual bleeding, trouble breathing or pain that is not related to your normal pain.  Depending on the type of procedure that was done, some parts of your body may feel week and/or numb.  This usually clears up by tonight or the next day.  Walk with the use of an assistive device or accompanied by an adult for the 24 hours.  You may use ice on the affected area for the first 24 hours.  Put ice in a Ziploc bag and cover with a towel and place against area 15 minutes on 15 minutes off.  You may switch to heat after 24 hours.

## 2016-07-18 ENCOUNTER — Telehealth: Payer: Self-pay | Admitting: *Deleted

## 2016-07-18 NOTE — Telephone Encounter (Signed)
No problems post procedure. 

## 2016-08-19 ENCOUNTER — Ambulatory Visit (INDEPENDENT_AMBULATORY_CARE_PROVIDER_SITE_OTHER): Payer: BLUE CROSS/BLUE SHIELD | Admitting: Neurology

## 2016-08-19 ENCOUNTER — Encounter: Payer: Self-pay | Admitting: Neurology

## 2016-08-19 VITALS — BP 114/70 | HR 83 | Ht 66.0 in | Wt 176.6 lb

## 2016-08-19 DIAGNOSIS — G253 Myoclonus: Secondary | ICD-10-CM

## 2016-08-19 NOTE — Addendum Note (Signed)
Addended by: Milta Deiters on: 08/19/2016 04:16 PM   Modules accepted: Orders

## 2016-08-19 NOTE — Progress Notes (Signed)
NEUROLOGY CONSULTATION NOTE  Kari Gibbs MRN: [id number] DOB: 11-11-1950  Referring provider: Dr. Dossie Arbour Primary care provider: Dr. Addison Lank  Reason for consult:  Body jerking  HISTORY OF PRESENT ILLNESS: Kari Gibbs is a 66 year old female with hypertension, depression, and chronic pain with long term use of opiates who presents for muscle twitching.  She is accompanied by her husband who supplements history.  She underwent several gastrointestinal surgeries in 2011-2012, including gastric bypass followed by surgery on her esophagus and jejunum.  She subsequently developed chronic pain and is on chronic opiates.  Over the past couple of years, she reports problems with balance.  Sometimes, her knee may give out and she falls.  Sometimes she will hold a cup and suddenly her hand will open, causing the cup to fall.  It is instantaneous and she doesn't seem to be zoned out.  Over the past year, she reports intermittent episodes of body jerking.  It involves usually the the upper body but can include up to the thighs.  It is usually brief, occurring off and on over a period of any hour.  It can occur every 2 days or daily (frequency fluctuates).  She had a recent episode that lasted 30 minutes.  She feels sore afterwards.  Her husband reports that she has depression and anxiety.  In addition to the complications of surgery which cause chronic pain, her father passed away, she was first on disability and then lost her job, and she cannot drive due to all of the medications she takes.  Cervical X-ray from 03/27/16 was personally reviewed and revealed degenerative disc disease from C4-C5 through C6-C7 with no significant neural foraminal narrowing.  Labs from 01/04/16 include D level of 49; CMP with Na 139, K 4.6, glucose 96, BUN 20, Cr 0.76, total bili 0.7, ALP 97, AST 33 and ALT 26; Mg 2.1, Sed rate 17; CRP negative; B12 321  PAST MEDICAL HISTORY: Past Medical History:  Diagnosis Date    . Abdominal pain   . Anxiety   . Arthritis    OA LOWER SPINE  . Depression    MOSTLY IN THE FALL OF THE YEAR  . 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 Fatigue   . Gastritis    Distal gastritis  . GERD (gastroesophageal reflux disease)    "told that she does"  . Headache(784.0)    HX OF MIGRAINES  . Hiatal hernia 09/2009  . History of kidney stones   . History of rib fracture (Left 4th and 8th rib) 06/28/2015  . Hypertension   . IBS (irritable bowel syndrome)   . Nausea & vomiting   . OSTEOARTHRITIS 05/01/2010   Qualifier: Diagnosis of  By: Megan Salon MD, John    . Pneumonia    WHEN PT IN 6 TH GRADE  . SBO (small bowel obstruction) 2013  . Varicose veins     PAST SURGICAL HISTORY: Past Surgical History:  Procedure Laterality Date  . ABDOMINAL HYSTERECTOMY  1988   partial  . CHOLECYSTECTOMY N/A 05/18/2013   Procedure: LAPAROSCOPIC CHOLECYSTECTOMY WITH INTRAOPERATIVE CHOLANGIOGRAM/laparoscopy/ ;  Surgeon: Pedro Earls, MD;  Location: [facility];  Service: General;  Laterality: N/A;  . EXPLORATORY LAPAROTOMY  2012  . GASTRIC BYPASS  11/13/2009  . HERNIA REPAIR  2011   hiatal  . 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 busPIRone (BUSPAR) 15 MG tablet TAKE 1/3 TO 1/2 TAB AS NEEDED FOR ANXIETY UP TO TWICE A DAY  1  . CHLORPHENIRAMINE MALEATE PO Take 4 mg by mouth every 6 (six) hours as needed.    . cholecalciferol (VITAMIN D) 1000 units tablet Take 4,000 Units by mouth daily.    . ferrous sulfate 325 (65 FE) MG tablet Take 325 mg by mouth daily with breakfast.    . gabapentin  (NEURONTIN) 300 MG capsule Take 3 capsules (900 mg total) by mouth 4 (four) times daily. May go up to 3 capsules 4 times a day. Titrate up slowly. 360 capsule 2  . Magnesium 500 MG TABS Take 1 tablet by mouth 2 (two) times daily.    . metroNIDAZOLE (METROCREAM) 0.75 % cream APPLY ON THE SKIN AS DIRECTED. APPLY TO AFFECTED AREAS DAILY AS NEEDED.  3  . morphine (MS CONTIN) 30 MG 12 hr tablet Take 1 tablet (30 mg total) by mouth every 12 (twelve) hours. 60 tablet 0  . [START ON 09/02/2016] morphine (MS CONTIN) 30 MG 12 hr tablet Take 1 tablet (30 mg total) by mouth every 12 (twelve) hours. 60 tablet 0  . naloxone (NARCAN) 2 MG/2ML injection Inject content of syringe into thigh muscle. Call 911. 2 Syringe 1  . omeprazole (PRILOSEC) 20 MG capsule Take 20 mg by mouth 2 (two) times daily.    . potassium gluconate 595 MG TABS tablet Take 595 mg by mouth daily as needed (low potassium).    Marland Kitchen PREMARIN vaginal cream INSERT 1GRAM VAGINALLY 3 TIMES WEEKLY AT BEDTIME  3  . PRENATAL VIT-FE FUM-FA-OMEGA PO Take 1 tablet by mouth daily.    . Probiotic Product (ALIGN) 4 MG CAPS Take 1 capsule by mouth at bedtime. Per Pt.    . Teriparatide, Recombinant, (FORTEO) 600 MCG/2.4ML SOLN Inject 20 mcg into the skin daily.    Marland Kitchen triamcinolone cream (KENALOG) 0.1 % ON THE SKIN TWICE A DAY. APPLY TO AFFECTED AREAS TWICE A DAY AS NEEDED.  1  . valsartan (DIOVAN) 40 MG tablet Take 40 mg by mouth every morning.    . venlafaxine XR (EFFEXOR-XR) 75 MG 24 hr capsule Take 225 mg by mouth every morning.    . Wheat Dextrin (BENEFIBER) POWD Stir 2 teaspoons of Benefiber into 4-8 oz of any non-carbonated beverage or soft food (hot or cold) TID. 500 g PRN  . morphine (MS CONTIN) 30 MG 12 hr tablet Take 1 tablet (30 mg total) by mouth every 12 (twelve) hours. 60 tablet 0   Current Facility-Administered Medications on File Prior to Visit  Medication Dose Route Frequency Provider Last Rate Last Dose  . fentaNYL (SUBLIMAZE) injection 25-50  mcg  25-50 mcg Intravenous Q5 min PRN [name], MD   50 mcg at 07/17/16 0931  . lactated ringers infusion 1,000 mL  1,000 mL Intravenous Once [name], MD      . lidocaine (PF) (XYLOCAINE) 1 % injection 10 mL  10 mL Other Once [name], MD      . midazolam (VERSED) 5 MG/5ML injection 1-2 mg  1-2 mg Intravenous Q5 min PRN [name], MD   1 mg at 07/17/16 [phone]  ALLERGIES: Allergies  Allergen Reactions  . Iohexol      Code: HIVES, Desc: Pt has had a prior IVP dye reaction,(no scans in health system).Symptoms were hives,and airway obstruction!, Onset Date: [id number]   . Ace Inhibitors     REACTION: cyclic cough  . Losartan Potassium Other (See Comments)    Raises blood pressure; fatigue  . Neosporin [Neomycin-Bacitracin Zn-Polymyx] Dermatitis    FAMILY HISTORY: Family History  Problem Relation Age of Onset  . Hypertension Mother   . Alzheimer's disease Father     SOCIAL HISTORY: Social History   Social History  . Marital status: Married    Spouse name: N/A  . Number of children: N/A  . Years of education: N/A   Occupational History  . Not on file.   Social History Main Topics  . Smoking status: Never Smoker  . Smokeless tobacco: Never Used  . Alcohol use No  . Drug use: No  . Sexual activity: Not on file   Other Topics Concern  . Not on file   Social History Narrative  . No narrative on file    REVIEW OF SYSTEMS: Constitutional: No fevers, chills, or sweats, no generalized fatigue, change in appetite Eyes: No visual changes, double vision, eye pain Ear, nose and throat: No hearing loss, ear pain, nasal congestion, sore throat Cardiovascular: No chest pain, palpitations Respiratory:  No shortness of breath at rest or with exertion, wheezes GastrointestinaI: No nausea, vomiting, diarrhea, abdominal pain, fecal incontinence Genitourinary:  No dysuria, urinary retention or frequency Musculoskeletal:  Neck pain, back  pain Integumentary: No rash, pruritus, skin lesions Neurological: as above Psychiatric: depression, anxiety Endocrine: No palpitations, fatigue, diaphoresis, mood swings, change in appetite, change in weight, increased thirst Hematologic/Lymphatic:  No purpura, petechiae. Allergic/Immunologic: no itchy/runny eyes, nasal congestion, recent allergic reactions, rashes  PHYSICAL EXAM: Vitals:   08/19/16 1506  BP: 114/70  Pulse: 83   General: No acute distress.  Patient appears well-groomed.  Head:  Normocephalic/atraumatic Eyes:  fundi examined but not visualized Neck: supple, bilateral paraspinal tenderness, full range of motion Back: No paraspinal tenderness Heart: regular rate and rhythm Lungs: Clear to auscultation bilaterally. Vascular: No carotid bruits. Neurological Exam: I personally observed an episode of jerking as recorded on her phone.  She exhibited arrhythmic jerking of her upper extremities, including her head, neck and twisting of her face. Mental status: alert and oriented to person, place, and time, recent and remote memory intact, fund of knowledge intact, attention and concentration intact, speech fluent and not dysarthric, language intact. Cranial nerves: CN I: not tested CN II: pupils equal, round and reactive to light, visual fields intact CN III, IV, VI:  full range of motion, no nystagmus, no ptosis CN V: facial sensation intact CN VII: upper and lower face symmetric CN VIII: hearing intact CN IX, X: gag intact, uvula midline CN XI: sternocleidomastoid and trapezius muscles intact CN XII: tongue midline Bulk & Tone: normal, no fasciculations. Motor:  5/5 throughout  Sensation: Pinprick and vibration sensation intact. Deep Tendon Reflexes:  2+ throughout, toes downgoing.  Finger to nose testing:  Without dysmetria.  Heel to shin:  Without dysmetria.  Gait:  Normal station and stride.  Able to turn. Romberg negative.  At one point, she had slight sway but  corrected herself.  IMPRESSION: 1.  Myoclonic jerking, which appears psychogenic, related to her depression. 2.  Gait instability.  Objective testing unremarkable.  The episodic loss of balance may be secondary to psychogenic body jerks.  PLAN: 1.  Although my suspicion is low, we will get a 48 hour ambulatory EEG to try and capture an event.  We will contact patient with results and whether follow up is needed.  Otherwise, I would continue treatment of her depression.  Thank you for allowing me to take part in the care of this patient.  Metta Clines, DO  CC:  Cari Caraway, MD  [name], MD

## 2016-08-19 NOTE — Patient Instructions (Signed)
I think the jerking is stress-related.  However, to be sure, we will get a 48 hour ambulatory EEG to look for any abnormalities in your brain waves.  I will let you know the results and whether further testing and follow up is warranted.

## 2016-09-02 ENCOUNTER — Encounter: Payer: Self-pay | Admitting: Pain Medicine

## 2016-09-02 ENCOUNTER — Ambulatory Visit: Payer: BLUE CROSS/BLUE SHIELD | Attending: Pain Medicine | Admitting: Pain Medicine

## 2016-09-02 VITALS — BP 108/59 | HR 70 | Temp 98.2°F | Resp 18 | Ht 66.0 in | Wt 170.0 lb

## 2016-09-02 DIAGNOSIS — Z79891 Long term (current) use of opiate analgesic: Secondary | ICD-10-CM | POA: Diagnosis not present

## 2016-09-02 DIAGNOSIS — M5414 Radiculopathy, thoracic region: Secondary | ICD-10-CM | POA: Insufficient documentation

## 2016-09-02 DIAGNOSIS — G894 Chronic pain syndrome: Secondary | ICD-10-CM | POA: Diagnosis not present

## 2016-09-02 DIAGNOSIS — R0789 Other chest pain: Secondary | ICD-10-CM | POA: Insufficient documentation

## 2016-09-02 DIAGNOSIS — M542 Cervicalgia: Secondary | ICD-10-CM | POA: Insufficient documentation

## 2016-09-02 DIAGNOSIS — M5481 Occipital neuralgia: Secondary | ICD-10-CM | POA: Diagnosis not present

## 2016-09-02 DIAGNOSIS — Z79899 Other long term (current) drug therapy: Secondary | ICD-10-CM | POA: Insufficient documentation

## 2016-09-02 NOTE — Progress Notes (Signed)
Safety precautions to be maintained throughout the outpatient stay will include: orient to surroundings, keep bed in low position, maintain call bell within reach at all times, provide assistance with transfer out of bed and ambulation.  

## 2016-09-02 NOTE — Patient Instructions (Signed)
GENERAL RISKS AND COMPLICATIONS  What are the risk, side effects and possible complications? Generally speaking, most procedures are safe.  However, with any procedure there are risks, side effects, and the possibility of complications.  The risks and complications are dependent upon the sites that are lesioned, or the type of nerve block to be performed.  The closer the procedure is to the spine, the more serious the risks are.  Great care is taken when placing the radio frequency needles, block needles or lesioning probes, but sometimes complications can occur. 1. Infection: Any time there is an injection through the skin, there is a risk of infection.  This is why sterile conditions are used for these blocks.  There are four possible types of infection. 1. Localized skin infection. 2. Central Nervous System Infection-This can be in the form of Meningitis, which can be deadly. 3. Epidural Infections-This can be in the form of an epidural abscess, which can cause pressure inside of the spine, causing compression of the spinal cord with subsequent paralysis. This would require an emergency surgery to decompress, and there are no guarantees that the patient would recover from the paralysis. 4. Discitis-This is an infection of the intervertebral discs.  It occurs in about 1% of discography procedures.  It is difficult to treat and it may lead to surgery.        2. Pain: the needles have to go through skin and soft tissues, will cause soreness.       3. Damage to internal structures:  The nerves to be lesioned may be near blood vessels or    other nerves which can be potentially damaged.       4. Bleeding: Bleeding is more common if the patient is taking blood thinners such as  aspirin, Coumadin, Ticiid, Plavix, etc., or if he/she have some genetic predisposition  such as hemophilia. Bleeding into the spinal canal can cause compression of the spinal  cord with subsequent paralysis.  This would require an  emergency surgery to  decompress and there are no guarantees that the patient would recover from the  paralysis.       5. Pneumothorax:  Puncturing of a lung is a possibility, every time a needle is introduced in  the area of the chest or upper back.  Pneumothorax refers to free air around the  collapsed lung(s), inside of the thoracic cavity (chest cavity).  Another two possible  complications related to a similar event would include: Hemothorax and Chylothorax.   These are variations of the Pneumothorax, where instead of air around the collapsed  lung(s), you may have blood or chyle, respectively.       6. Spinal headaches: They may occur with any procedures in the area of the spine.       7. Persistent CSF (Cerebro-Spinal Fluid) leakage: This is a rare problem, but may occur  with prolonged intrathecal or epidural catheters either due to the formation of a fistulous  track or a dural tear.       8. Nerve damage: By working so close to the spinal cord, there is always a possibility of  nerve damage, which could be as serious as a permanent spinal cord injury with  paralysis.       9. Death:  Although rare, severe deadly allergic reactions known as "Anaphylactic  reaction" can occur to any of the medications used.      10. Worsening of the symptoms:  We can always make thing worse.    What are the chances of something like this happening? Chances of any of this occuring are extremely low.  By statistics, you have more of a chance of getting killed in a motor vehicle accident: while driving to the hospital than any of the above occurring .  Nevertheless, you should be aware that they are possibilities.  In general, it is similar to taking a shower.  Everybody knows that you can slip, hit your head and get killed.  Does that mean that you should not shower again?  Nevertheless always keep in mind that statistics do not mean anything if you happen to be on the wrong side of them.  Even if a procedure has a 1  (one) in a 1,000,000 (million) chance of going wrong, it you happen to be that one..Also, keep in mind that by statistics, you have more of a chance of having something go wrong when taking medications.  Who should not have this procedure? If you are on a blood thinning medication (e.g. Coumadin, Plavix, see list of "Blood Thinners"), or if you have an active infection going on, you should not have the procedure.  If you are taking any blood thinners, please inform your physician.  How should I prepare for this procedure?  Do not eat or drink anything at least six hours prior to the procedure.  Bring a driver with you .  It cannot be a taxi.  Come accompanied by an adult that can drive you back, and that is strong enough to help you if your legs get weak or numb from the local anesthetic.  Take all of your medicines the morning of the procedure with just enough water to swallow them.  If you have diabetes, make sure that you are scheduled to have your procedure done first thing in the morning, whenever possible.  If you have diabetes, take only half of your insulin dose and notify our nurse that you have done so as soon as you arrive at the clinic.  If you are diabetic, but only take blood sugar pills (oral hypoglycemic), then do not take them on the morning of your procedure.  You may take them after you have had the procedure.  Do not take aspirin or any aspirin-containing medications, at least eleven (11) days prior to the procedure.  They may prolong bleeding.  Wear loose fitting clothing that may be easy to take off and that you would not mind if it got stained with Betadine or blood.  Do not wear any jewelry or perfume  Remove any nail coloring.  It will interfere with some of our monitoring equipment.  NOTE: Remember that this is not meant to be interpreted as a complete list of all possible complications.  Unforeseen problems may occur.  BLOOD THINNERS The following drugs  contain aspirin or other products, which can cause increased bleeding during surgery and should not be taken for 2 weeks prior to and 1 week after surgery.  If you should need take something for relief of minor pain, you may take acetaminophen which is found in Tylenol,m Datril, Anacin-3 and Panadol. It is not blood thinner. The products listed below are.  Do not take any of the products listed below in addition to any listed on your instruction sheet.  A.P.C or A.P.C with Codeine Codeine Phosphate Capsules #3 Ibuprofen Ridaura  ABC compound Congesprin Imuran rimadil  Advil Cope Indocin Robaxisal  Alka-Seltzer Effervescent Pain Reliever and Antacid Coricidin or Coricidin-D  Indomethacin Rufen    Alka-Seltzer plus Cold Medicine Cosprin Ketoprofen S-A-C Tablets  Anacin Analgesic Tablets or Capsules Coumadin Korlgesic Salflex  Anacin Extra Strength Analgesic tablets or capsules CP-2 Tablets Lanoril Salicylate  Anaprox Cuprimine Capsules Levenox Salocol  Anexsia-D Dalteparin Magan Salsalate  Anodynos Darvon compound Magnesium Salicylate Sine-off  Ansaid Dasin Capsules Magsal Sodium Salicylate  Anturane Depen Capsules Marnal Soma  APF Arthritis pain formula Dewitt's Pills Measurin Stanback  Argesic Dia-Gesic Meclofenamic Sulfinpyrazone  Arthritis Bayer Timed Release Aspirin Diclofenac Meclomen Sulindac  Arthritis pain formula Anacin Dicumarol Medipren Supac  Analgesic (Safety coated) Arthralgen Diffunasal Mefanamic Suprofen  Arthritis Strength Bufferin Dihydrocodeine Mepro Compound Suprol  Arthropan liquid Dopirydamole Methcarbomol with Aspirin Synalgos  ASA tablets/Enseals Disalcid Micrainin Tagament  Ascriptin Doan's Midol Talwin  Ascriptin A/D Dolene Mobidin Tanderil  Ascriptin Extra Strength Dolobid Moblgesic Ticlid  Ascriptin with Codeine Doloprin or Doloprin with Codeine Momentum Tolectin  Asperbuf Duoprin Mono-gesic Trendar  Aspergum Duradyne Motrin or Motrin IB Triminicin  Aspirin  plain, buffered or enteric coated Durasal Myochrisine Trigesic  Aspirin Suppositories Easprin Nalfon Trillsate  Aspirin with Codeine Ecotrin Regular or Extra Strength Naprosyn Uracel  Atromid-S Efficin Naproxen Ursinus  Auranofin Capsules Elmiron Neocylate Vanquish  Axotal Emagrin Norgesic Verin  Azathioprine Empirin or Empirin with Codeine Normiflo Vitamin E  Azolid Emprazil Nuprin Voltaren  Bayer Aspirin plain, buffered or children's or timed BC Tablets or powders Encaprin Orgaran Warfarin Sodium  Buff-a-Comp Enoxaparin Orudis Zorpin  Buff-a-Comp with Codeine Equegesic Os-Cal-Gesic   Buffaprin Excedrin plain, buffered or Extra Strength Oxalid   Bufferin Arthritis Strength Feldene Oxphenbutazone   Bufferin plain or Extra Strength Feldene Capsules Oxycodone with Aspirin   Bufferin with Codeine Fenoprofen Fenoprofen Pabalate or Pabalate-SF   Buffets II Flogesic Panagesic   Buffinol plain or Extra Strength Florinal or Florinal with Codeine Panwarfarin   Buf-Tabs Flurbiprofen Penicillamine   Butalbital Compound Four-way cold tablets Penicillin   Butazolidin Fragmin Pepto-Bismol   Carbenicillin Geminisyn Percodan   Carna Arthritis Reliever Geopen Persantine   Carprofen Gold's salt Persistin   Chloramphenicol Goody's Phenylbutazone   Chloromycetin Haltrain Piroxlcam   Clmetidine heparin Plaquenil   Cllnoril Hyco-pap Ponstel   Clofibrate Hydroxy chloroquine Propoxyphen         Before stopping any of these medications, be sure to consult the physician who ordered them.  Some, such as Coumadin (Warfarin) are ordered to prevent or treat serious conditions such as "deep thrombosis", "pumonary embolisms", and other heart problems.  The amount of time that you may need off of the medication may also vary with the medication and the reason for which you were taking it.  If you are taking any of these medications, please make sure you notify your pain physician before you undergo any  procedures.         Occipital Nerve Block Patient Information  Description: The occipital nerves originate in the cervical (neck) spinal cord and travel upward through muscle and tissue to supply sensation to the back of the head and top of the scalp.  In addition, the nerves control some of the muscles of the scalp.  Occipital neuralgia is an irritation of these nerves which can cause headaches, numbness of the scalp, and neck discomfort.     The occipital nerve block will interrupt nerve transmission through these nerves and can relieve pain and spasm.  The block consists of insertion of a small needle under the skin in the back of the head to deposit local anesthetic (numbing medicine) and/or steroids around the nerve.  The   entire block usually lasts less than 5 minutes.  Conditions which may be treated by occipital blocks:   Muscular pain and spasm of the scalp  Nerve irritation, back of the head  Headaches  Upper neck pain  Preparation for the injection:  12. Do not eat any solid food or dairy products within 8 hours of your appointment. 13. You may drink clear liquids up to 3 hours before appointment.  Clear liquids include water, black coffee, juice or soda.  No milk or cream please. 14. You may take your regular medication, including pain medications, with a sip of water before you appointment.  Diabetics should hold regular insulin (if taken separately) and take 1/2 normal NPH dose the morning of the procedure.  Carry some sugar containing items with you to your appointment. 15. A driver must accompany you and be prepared to drive you home after your procedure. 16. Bring all your current medications with you. 17. An IV may be inserted and sedation may be given at the discretion of the physician. 18. A blood pressure cuff, EKG, and other monitors will often be applied during the procedure.  Some patients may need to have extra oxygen administered for a short period. 19. You  will be asked to provide medical information, including your allergies and medications, prior to the procedure.  We must know immediately if you are taking blood thinners (like Coumadin/Warfarin) or if you are allergic to IV iodine contrast (dye).  We must know if you could possible be pregnant.  20. Do not wear a high collared shirt or turtleneck.  Tie long hair up in the back if possible.  Possible side-effects:   Bleeding from needle site  Infection (rare, may require surgery)  Nerve injury (rare)  Hair on back of neck can be tinged with iodine scrub (this will wash out)  Light-headedness (temporary)  Pain at injection site (several days)  Decreased blood pressure (rare, temporary)  Seizure (very rare)  Call if you experience:   Hives or difficulty breathing ( go to the emergency room)  Inflammation or drainage at the injection site(s)  Please note:  Although the local anesthetic injected can often make your painful muscles or headache feel good for several hours after the injection, the pain may return.  It takes 3-7 days for steroids to work.  You may not notice any pain relief for at least one week.  If effective, we will often do a series of injections spaced 3-6 weeks apart to maximally decrease your pain.  If you have any questions, please call [phone] [facility]  

## 2016-09-02 NOTE — Progress Notes (Signed)
Patient's Name: Kari Gibbs  MRN: [id number]  Referring Provider: Cari Caraway, MD  DOB: 11-17-1950  PCP: Cari Caraway, MD  DOS: 09/02/2016  Note by: Kathlen Brunswick. Dossie Arbour, MD  Service setting: Ambulatory outpatient  Specialty: Interventional Pain Management  Location: [facility] (AMB) Pain Management Facility    Patient type: Established   Primary Reason(s) for Visit: Encounter for post-procedure evaluation of chronic illness with mild to moderate exacerbation CC: Back Pain (left); Chest Pain (left); and Neck Pain (left)  HPI  [name] is a 66 y.o. year old, female patient, who comes today for a post-procedure evaluation. 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; S/P laparoscopic cholecystectomy Oct 2014; Fracture of rib of left side; Long term current use of opiate analgesic; Long term prescription opiate use; Opiate use (60 MME/day); Encounter for therapeutic drug level monitoring; Opiate dependence ([place]); Encounter for chronic pain management; Chronic pain syndrome; Chronic flank pain (Location of Primary Source of Pain) (Left); Chronic thoracic radicular pain (Left-sided); Opioid-induced constipation (OIC); Chronic occipital neuralgia (Left); Neuropathic pain; Neurogenic pain; History of rib fracture (Left 4th and 8th rib); Vertebral body hemangioma (T11); Metatarsalgia of right foot; Chronic abdominal pain (Bilateral) (L>R) (possible left-sided thoracic radiculopathy); Osteoarthritis; Return to work evaluation (FCE: Medium); Varicose veins of bilateral lower extremities with other complications; Varicose veins of right lower extremity with complications; Chronic neck pain (Bilateral) (L>R); Cervical facet syndrome (Bilateral) (L>R); Muscle spasticity; Tremors of nervous system; Muscle twitching; and Cervical spondylosis on her problem list. Her primarily concern today is the Back  Pain (left); Chest Pain (left); and Neck Pain (left)  Pain Assessment: Self-Reported Pain Score: 3 /10             Reported level is compatible with observation.       Pain Type: Chronic pain Pain Location: Back (ribs, neck) Pain Orientation: Left (right neck) Pain Descriptors / Indicators: Dull, Squeezing Pain Frequency: Constant  [name] comes in today for post-procedure evaluation after the treatment done on 07/17/2016.  Further details on both, my assessment(s), as well as the proposed treatment plan, please see below.  Post-Procedure Assessment  07/17/2016 Procedure: Diagnostic left-sided greater occipital nerve block #2 under fluoroscopic guidance and IV sedation. (Diagnostic #1 done on 11/02/2015) Post-procedure pain score: 0/10 (100% relief) Influential Factors: BMI: 27.44 kg/m Intra-procedural challenges: None observed Assessment challenges: None detected         Post-procedural side-effects, adverse reactions, or complications: None reported Reported issues: None  Sedation: Sedation provided. When no sedatives are used, the analgesic levels obtained are directly associated to the effectiveness of the local anesthetics. However, when sedation is provided, the level of analgesia obtained during the initial 1 hour following the intervention, is believed to be the result of a combination of factors. These factors may include, but are not limited to: 1. The effectiveness of the local anesthetics used. 2. The effects of the analgesic(s) and/or anxiolytic(s) used. 3. The degree of discomfort experienced by the patient at the time of the procedure. 4. The patients ability and reliability in recalling and recording the events. 5. The presence and influence of possible secondary gains and/or psychosocial factors. Reported result: Relief experienced during the 1st hour after the procedure:   100% (Ultra-Short Term Relief) Interpretative annotation: Analgesia during this period is  likely to be Local Anesthetic and/or IV Sedative (Analgesic/Anxiolitic) related.          Effects of  local anesthetic: The analgesic effects attained during this period are directly associated to the localized infiltration of local anesthetics and therefore cary significant diagnostic value as to the etiological location, or anatomical origin, of the pain. Expected duration of relief is directly dependent on the pharmacodynamics of the local anesthetic used. Long-acting (4-6 hours) anesthetics used.  Reported result: Relief during the next 4 to 6 hour after the procedure:   100% (Short-Term Relief) Interpretative annotation: Complete relief would suggest area to be the source of the pain.          Long-term benefit: Defined as the period of time past the expected duration of local anesthetics. With the possible exception of prolonged sympathetic blockade from the local anesthetics, benefits during this period are typically attributed to, or associated with, other factors such as analgesic sensory neuropraxia, antiinflammatory effects, or beneficial biochemical changes provided by agents other than the local anesthetics Reported result: Extended relief following procedure:   90% (Long-Term Relief) Interpretative annotation: Good relief. This could suggest inflammation to be a significant component in the etiology to the pain.          Current benefits: Defined as persistent relief that continues at this point in time.   Reported results: Treated area: 75 % [name] reports improvement in function Interpretative annotation: Ongoing benefits would suggest effective therapeutic approach  Interpretation: Results would suggest a successful diagnostic intervention.          Laboratory Chemistry  Inflammation Markers Lab Results  Component Value Date   ESRSEDRATE 17 01/04/2016   CRP <0.5 01/04/2016   Renal Function Lab Results  Component Value Date   BUN 20 01/04/2016   CREATININE 0.76  01/04/2016   GFRAA >60 01/04/2016   GFRNONAA >60 01/04/2016   Hepatic Function Lab Results  Component Value Date   AST 33 01/04/2016   ALT 26 01/04/2016   ALBUMIN 4.2 01/04/2016   Electrolytes Lab Results  Component Value Date   NA 139 01/04/2016   K 4.6 01/04/2016   CL 99 (L) 01/04/2016   CALCIUM 9.7 01/04/2016   MG 2.1 01/04/2016   Pain Modulating Vitamins Lab Results  Component Value Date   25OHVITD1 49 01/04/2016   25OHVITD2 25 01/04/2016   25OHVITD3 24 01/04/2016   VITAMINB12 321 01/04/2016   Coagulation Parameters Lab Results  Component Value Date   PLT 238 11/18/2014   Cardiovascular Lab Results  Component Value Date   HGB 11.1 (L) 11/18/2014   HCT 34.1 (L) 11/18/2014   Note: Lab results reviewed.  Recent Diagnostic Imaging Review  Dg C-arm 1-60 Min-no Report  Result Date: 07/17/2016 There is no report for this exam.  Note: Imaging results reviewed.          Meds  The patient has a current medication list which includes the following prescription(s): buspirone, chlorpheniramine maleate, cholecalciferol, ferrous sulfate, gabapentin, lubiprostone, magnesium, metronidazole, morphine, morphine, naloxone, omeprazole, potassium gluconate, premarin, prenatal vit-fe fum-fa-omega, align, teriparatide (recombinant), triamcinolone cream, valsartan, venlafaxine xr, benefiber, and morphine.  Current Outpatient Prescriptions on File Prior to Visit  Medication Sig  . busPIRone (BUSPAR) 15 MG tablet TAKE 1/3 TO 1/2 TAB AS NEEDED FOR ANXIETY UP TO TWICE A DAY  . CHLORPHENIRAMINE MALEATE PO Take 4 mg by mouth every 6 (six) hours as needed.  . cholecalciferol (VITAMIN D) 1000 units tablet Take 4,000 Units by mouth daily.  . ferrous sulfate 325 (65 FE) MG tablet Take 325 mg by mouth daily with breakfast.  . gabapentin (NEURONTIN)  300 MG capsule Take 3 capsules (900 mg total) by mouth 4 (four) times daily. May go up to 3 capsules 4 times a day. Titrate up slowly.  .  Lubiprostone (AMITIZA PO) Take 1 capsule by mouth every 8 (eight) hours.  . Magnesium 500 MG TABS Take 1 tablet by mouth 2 (two) times daily.  . metroNIDAZOLE (METROCREAM) 0.75 % cream APPLY ON THE SKIN AS DIRECTED. APPLY TO AFFECTED AREAS DAILY AS NEEDED.  Marland Kitchen morphine (MS CONTIN) 30 MG 12 hr tablet Take 1 tablet (30 mg total) by mouth every 12 (twelve) hours.  Marland Kitchen morphine (MS CONTIN) 30 MG 12 hr tablet Take 1 tablet (30 mg total) by mouth every 12 (twelve) hours.  . naloxone (NARCAN) 2 MG/2ML injection Inject content of syringe into thigh muscle. Call 911.  . omeprazole (PRILOSEC) 20 MG capsule Take 20 mg by mouth 2 (two) times daily.  . potassium gluconate 595 MG TABS tablet Take 595 mg by mouth daily as needed (low potassium).  Marland Kitchen PREMARIN vaginal cream INSERT 1GRAM VAGINALLY 3 TIMES WEEKLY AT BEDTIME  . PRENATAL VIT-FE FUM-FA-OMEGA PO Take 1 tablet by mouth daily.  . Probiotic Product (ALIGN) 4 MG CAPS Take 1 capsule by mouth at bedtime. Per Pt.  . Teriparatide, Recombinant, (FORTEO) 600 MCG/2.4ML SOLN Inject 20 mcg into the skin daily.  Marland Kitchen triamcinolone cream (KENALOG) 0.1 % ON THE SKIN TWICE A DAY. APPLY TO AFFECTED AREAS TWICE A DAY AS NEEDED.  Marland Kitchen valsartan (DIOVAN) 40 MG tablet Take 40 mg by mouth every morning.  . venlafaxine XR (EFFEXOR-XR) 75 MG 24 hr capsule Take 225 mg by mouth every morning.  . Wheat Dextrin (BENEFIBER) POWD Stir 2 teaspoons of Benefiber into 4-8 oz of any non-carbonated beverage or soft food (hot or cold) TID.  Marland Kitchen morphine (MS CONTIN) 30 MG 12 hr tablet Take 1 tablet (30 mg total) by mouth every 12 (twelve) hours.   No current facility-administered medications on file prior to visit.    ROS  Constitutional: Denies any fever or chills Gastrointestinal: No reported hemesis, hematochezia, vomiting, or acute GI distress Musculoskeletal: Denies any acute onset joint swelling, redness, loss of ROM, or weakness Neurological: No reported episodes of acute onset apraxia,  aphasia, dysarthria, agnosia, amnesia, paralysis, loss of coordination, or loss of consciousness  Allergies  Ms. Haist is allergic to iohexol; ace inhibitors; losartan potassium; and neosporin [neomycin-bacitracin zn-polymyx].  [place]  Drug: Ms. Sleeth  reports that she does not use drugs. Alcohol:  reports that she does not drink alcohol. Tobacco:  reports that she has never smoked. She has never used smokeless tobacco. 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 Family: family history includes Alzheimer's disease in her father; Hypertension in her mother.  Past Surgical History:  Procedure Laterality Date  . ABDOMINAL HYSTERECTOMY  1988   partial  . CHOLECYSTECTOMY N/A 05/18/2013   Procedure: LAPAROSCOPIC CHOLECYSTECTOMY WITH INTRAOPERATIVE CHOLANGIOGRAM/laparoscopy/ ;  Surgeon: Pedro Earls, MD;  Location: [facility];  Service: General;  Laterality: N/A;  . EXPLORATORY LAPAROTOMY  2012  . GASTRIC BYPASS  11/13/2009  . HERNIA REPAIR  2011   hiatal  . 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 TMJ- LEFT--ABOUT 10 YRS AGO--LEFT SIDE--PT NOW HAS SOME PAIN LEFT JAW Left    still has pain, scar tissue has been removed   Constitutional Exam  General appearance: Well nourished, well developed, and well hydrated. In no apparent acute distress Vitals:   09/02/16 1312  BP: (!) 108/59  Pulse: 70  Resp: 18  Temp: 98.2 F (36.8 C)   SpO2: 100%  Weight: 170 lb (77.1 kg)  Height: 5\' 6"  (1.676 m)   BMI Assessment: Estimated body mass index is 27.44 kg/m as calculated from the following:   Height as of this encounter: 5\' 6"  (1.676 m).   Weight as of this encounter: 170 lb (77.1 kg).  BMI interpretation table: BMI level Category Range association with higher incidence of chronic pain  <18 kg/m2 Underweight   18.5-24.9 kg/m2 Ideal body weight   25-29.9 kg/m2 Overweight Increased incidence by 20%  30-34.9 kg/m2 Obese (Class I) Increased incidence by 68%  35-39.9 kg/m2 Severe obesity (Class II) Increased incidence by 136%  >40 kg/m2 Extreme obesity (Class III) Increased incidence by 254%   BMI Readings from Last 4 Encounters:  09/02/16 27.44 kg/m  08/19/16 28.50 kg/m  07/17/16 26.95 kg/m  07/16/16 26.95 kg/m   Wt Readings from Last 4 Encounters:  09/02/16 170 lb (77.1 kg)  08/19/16 176 lb 9.6 oz (80.1 kg)  07/17/16 167 lb (75.8 kg)  07/16/16 167 lb (75.8 kg)  Psych/Mental status: Alert, oriented x 3 (person, place, & time)       Eyes: PERLA Respiratory: No evidence of acute respiratory distress  Cervical Spine Exam  Inspection: No masses, redness, or swelling Alignment: Symmetrical Functional ROM: Unrestricted ROM Stability: No instability detected Muscle strength & Tone: Functionally intact Sensory: Unimpaired Palpation: Non-contributory  Upper Extremity (UE) Exam    Side: Right upper extremity  Side: Left upper extremity  Inspection: No masses, redness, swelling, or asymmetry  Inspection: No masses, redness, swelling, or asymmetry  Functional ROM: Unrestricted ROM          Functional ROM: Unrestricted ROM          Muscle strength & Tone: Functionally intact  Muscle strength & Tone: Functionally intact  Sensory: Unimpaired  Sensory: Unimpaired  Palpation: Non-contributory  Palpation: Non-contributory   Thoracic Spine Exam  Inspection: No masses, redness, or swelling Alignment:  Symmetrical Functional ROM: Unrestricted ROM Stability: No instability detected Sensory: Unimpaired Muscle strength & Tone: Functionally intact Palpation: Non-contributory  Lumbar Spine Exam  Inspection: No masses, redness, or swelling Alignment: Symmetrical Functional ROM: Unrestricted ROM Stability: No instability detected Muscle strength & Tone: Functionally intact Sensory: Unimpaired Palpation: Non-contributory Provocative Tests: Lumbar Hyperextension and rotation test: evaluation deferred today       Patrick's Maneuver: evaluation deferred today              Gait & Posture Assessment  Ambulation: Unassisted Gait: Relatively normal for age and body habitus Posture: WNL   Lower Extremity Exam    Side: Right lower extremity  Side: Left lower extremity  Inspection: No masses, redness, swelling, or asymmetry  Inspection: No masses, redness, swelling, or asymmetry  Functional ROM: Unrestricted ROM          Functional ROM: Unrestricted ROM          Muscle strength & Tone: Functionally intact  Muscle strength & Tone: Functionally intact  Sensory:  Unimpaired  Sensory: Unimpaired  Palpation: Non-contributory  Palpation: Non-contributory   Assessment  Primary Diagnosis & Pertinent Problem List: The primary encounter diagnosis was Chronic occipital neuralgia (Left). Diagnoses of Chronic pain syndrome, Chronic thoracic radicular pain (Left-sided), and Long term prescription opiate use were also pertinent to this visit.  Status Diagnosis  Controlled Controlled Controlled 1. Chronic occipital neuralgia (Left)   2. Chronic pain syndrome   3. Chronic thoracic radicular pain (Left-sided)   4. Long term prescription opiate use      Plan of Care  Pharmacotherapy (Medications Ordered): No orders of the defined types were placed in this encounter.  New Prescriptions   No medications on file   Medications administered today: Ms. Gemelli had no medications administered during this  visit. Lab-work, procedure(s), and/or referral(s): Orders Placed This Encounter  Procedures  . GREATER OCCIPITAL NERVE BLOCK  . Occipital Nerve Radiofrequency  . SELECTIVE NERVE ROOT   Imaging and/or referral(s): None  Interventional therapies: Planned, scheduled, and/or pending:   Not at this time.   Considering:   Left greater occipital nerve RFA  Diagnostic left C2 + TON nerve block  Possible left C2 + TON    Palliative PRN treatment(s):   Diagnostic left C2 + TON nerve block    Provider-requested follow-up: Return for Keep prior appointment.  Future Appointments Date Time Provider [place]  09/12/2016 10:00 AM [name], MD [facility] None  09/16/2016 9:30 AM [facility] EEG TECH [facility] None  09/17/2016 10:00 AM Lady Saucier, RD [place] [facility]   Primary Care Physician: Cari Caraway, MD Location: [facility] Out[facility] Management Facility Note by: Kathlen Brunswick. Dossie Arbour, M.D, DABA, DABAPM, DABPM, DABIPP, FIPP Date: 09/02/2016; Time: 6:26 PM  Pain Score Disclaimer: We use the NRS-11 scale. This is a self-reported, subjective measurement of pain severity with only modest accuracy. It is used primarily to identify changes within a particular patient. It must be understood that outpatient pain scales are significantly less accurate that those used for research, where they can be applied under ideal controlled circumstances with minimal exposure to variables. In reality, the score is likely to be a combination of pain intensity and pain affect, where pain affect describes the degree of emotional arousal or changes in action readiness caused by the sensory experience of pain. Factors such as social and work situation, setting, emotional state, anxiety levels, expectation, and prior pain experience may influence pain perception and show large inter-individual differences that may also be affected by time variables.  Patient instructions provided during this  appointment: Patient Instructions   GENERAL RISKS AND COMPLICATIONS  What are the risk, side effects and possible complications? Generally speaking, most procedures are safe.  However, with any procedure there are risks, side effects, and the possibility of complications.  The risks and complications are dependent upon the sites that are lesioned, or the type of nerve block to be performed.  The closer the procedure is to the spine, the more serious the risks are.  Great care is taken when placing the radio frequency needles, block needles or lesioning probes, but sometimes complications can occur. 1. Infection: Any time there is an injection through the skin, there is a risk of infection.  This is why sterile conditions are used for these blocks.  There are four possible types of infection. 1. Localized skin infection. 2. Central Nervous System Infection-This can be in the form of Meningitis, which can be deadly. 3. Epidural Infections-This can be in the form of an epidural abscess,  which can cause pressure inside of the spine, causing compression of the spinal cord with subsequent paralysis. This would require an emergency surgery to decompress, and there are no guarantees that the patient would recover from the paralysis. 4. Discitis-This is an infection of the intervertebral discs.  It occurs in about 1% of discography procedures.  It is difficult to treat and it may lead to surgery.        2. Pain: the needles have to go through skin and soft tissues, will cause soreness.       3. Damage to internal structures:  The nerves to be lesioned may be near blood vessels or    other nerves which can be potentially damaged.       4. Bleeding: Bleeding is more common if the patient is taking blood thinners such as  aspirin, Coumadin, Ticiid, Plavix, etc., or if he/she have some genetic predisposition  such as hemophilia. Bleeding into the spinal canal can cause compression of the spinal  cord with  subsequent paralysis.  This would require an emergency surgery to  decompress and there are no guarantees that the patient would recover from the  paralysis.       5. Pneumothorax:  Puncturing of a lung is a possibility, every time a needle is introduced in  the area of the chest or upper back.  Pneumothorax refers to free air around the  collapsed lung(s), inside of the thoracic cavity (chest cavity).  Another two possible  complications related to a similar event would include: Hemothorax and Chylothorax.   These are variations of the Pneumothorax, where instead of air around the collapsed  lung(s), you may have blood or chyle, respectively.       6. Spinal headaches: They may occur with any procedures in the area of the spine.       7. Persistent CSF (Cerebro-Spinal Fluid) leakage: This is a rare problem, but may occur  with prolonged intrathecal or epidural catheters either due to the formation of a fistulous  track or a dural tear.       8. Nerve damage: By working so close to the spinal cord, there is always a possibility of  nerve damage, which could be as serious as a permanent spinal cord injury with  paralysis.       9. Death:  Although rare, severe deadly allergic reactions known as "Anaphylactic  reaction" can occur to any of the medications used.      10. Worsening of the symptoms:  We can always make thing worse.  What are the chances of something like this happening? Chances of any of this occuring are extremely low.  By statistics, you have more of a chance of getting killed in a motor vehicle accident: while driving to the hospital than any of the above occurring .  Nevertheless, you should be aware that they are possibilities.  In general, it is similar to taking a shower.  Everybody knows that you can slip, hit your head and get killed.  Does that mean that you should not shower again?  Nevertheless always keep in mind that statistics do not mean anything if you happen to be on the wrong  side of them.  Even if a procedure has a 1 (one) in a 1,000,000 (million) chance of going wrong, it you happen to be that one..Also, keep in mind that by statistics, you have more of a chance of having something go wrong when taking medications.  Who  should not have this procedure? If you are on a blood thinning medication (e.g. Coumadin, Plavix, see list of "Blood Thinners"), or if you have an active infection going on, you should not have the procedure.  If you are taking any blood thinners, please inform your physician.  How should I prepare for this procedure?  Do not eat or drink anything at least six hours prior to the procedure.  Bring a driver with you .  It cannot be a taxi.  Come accompanied by an adult that can drive you back, and that is strong enough to help you if your legs get weak or numb from the local anesthetic.  Take all of your medicines the morning of the procedure with just enough water to swallow them.  If you have diabetes, make sure that you are scheduled to have your procedure done first thing in the morning, whenever possible.  If you have diabetes, take only half of your insulin dose and notify our nurse that you have done so as soon as you arrive at the clinic.  If you are diabetic, but only take blood sugar pills (oral hypoglycemic), then do not take them on the morning of your procedure.  You may take them after you have had the procedure.  Do not take aspirin or any aspirin-containing medications, at least eleven (11) days prior to the procedure.  They may prolong bleeding.  Wear loose fitting clothing that may be easy to take off and that you would not mind if it got stained with Betadine or blood.  Do not wear any jewelry or perfume  Remove any nail coloring.  It will interfere with some of our monitoring equipment.  NOTE: Remember that this is not meant to be interpreted as a complete list of all possible complications.  Unforeseen problems may  occur.  BLOOD THINNERS The following drugs contain aspirin or other products, which can cause increased bleeding during surgery and should not be taken for 2 weeks prior to and 1 week after surgery.  If you should need take something for relief of minor pain, you may take acetaminophen which is found in Tylenol,m Datril, Anacin-3 and Panadol. It is not blood thinner. The products listed below are.  Do not take any of the products listed below in addition to any listed on your instruction sheet.  A.P.C or A.P.C with Codeine Codeine Phosphate Capsules #3 Ibuprofen Ridaura  ABC compound Congesprin Imuran rimadil  Advil Cope Indocin Robaxisal  Alka-Seltzer Effervescent Pain Reliever and Antacid Coricidin or Coricidin-D  Indomethacin Rufen  Alka-Seltzer plus Cold Medicine Cosprin Ketoprofen S-A-C Tablets  Anacin Analgesic Tablets or Capsules Coumadin Korlgesic Salflex  Anacin Extra Strength Analgesic tablets or capsules CP-2 Tablets Lanoril Salicylate  Anaprox Cuprimine Capsules Levenox Salocol  Anexsia-D Dalteparin Magan Salsalate  Anodynos Darvon compound Magnesium Salicylate Sine-off  Ansaid Dasin Capsules Magsal Sodium Salicylate  Anturane Depen Capsules Marnal Soma  APF Arthritis pain formula Dewitt's Pills Measurin Stanback  Argesic Dia-Gesic Meclofenamic Sulfinpyrazone  Arthritis Bayer Timed Release Aspirin Diclofenac Meclomen Sulindac  Arthritis pain formula Anacin Dicumarol Medipren Supac  Analgesic (Safety coated) Arthralgen Diffunasal Mefanamic Suprofen  Arthritis Strength Bufferin Dihydrocodeine Mepro Compound Suprol  Arthropan liquid Dopirydamole Methcarbomol with Aspirin Synalgos  ASA tablets/Enseals Disalcid Micrainin Tagament  Ascriptin Doan's Midol Talwin  Ascriptin A/D Dolene Mobidin Tanderil  Ascriptin Extra Strength Dolobid Moblgesic Ticlid  Ascriptin with Codeine Doloprin or Doloprin with Codeine Momentum Tolectin  Asperbuf Duoprin Mono-gesic Trendar  Aspergum Duradyne  Motrin  or Motrin IB Triminicin  Aspirin plain, buffered or enteric coated Durasal Myochrisine Trigesic  Aspirin Suppositories Easprin Nalfon Trillsate  Aspirin with Codeine Ecotrin Regular or Extra Strength Naprosyn Uracel  Atromid-S Efficin Naproxen Ursinus  Auranofin Capsules Elmiron Neocylate Vanquish  Axotal Emagrin Norgesic Verin  Azathioprine Empirin or Empirin with Codeine Normiflo Vitamin E  Azolid Emprazil Nuprin Voltaren  Bayer Aspirin plain, buffered or children's or timed BC Tablets or powders Encaprin Orgaran Warfarin Sodium  Buff-a-Comp Enoxaparin Orudis Zorpin  Buff-a-Comp with Codeine Equegesic Os-Cal-Gesic   Buffaprin Excedrin plain, buffered or Extra Strength Oxalid   Bufferin Arthritis Strength Feldene Oxphenbutazone   Bufferin plain or Extra Strength Feldene Capsules Oxycodone with Aspirin   Bufferin with Codeine Fenoprofen Fenoprofen Pabalate or Pabalate-SF   Buffets II Flogesic Panagesic   Buffinol plain or Extra Strength Florinal or Florinal with Codeine Panwarfarin   Buf-Tabs Flurbiprofen Penicillamine   Butalbital Compound Four-way cold tablets Penicillin   Butazolidin Fragmin Pepto-Bismol   Carbenicillin Geminisyn Percodan   Carna Arthritis Reliever Geopen Persantine   Carprofen Gold's salt Persistin   Chloramphenicol Goody's Phenylbutazone   Chloromycetin Haltrain Piroxlcam   Clmetidine heparin Plaquenil   Cllnoril Hyco-pap Ponstel   Clofibrate Hydroxy chloroquine Propoxyphen         Before stopping any of these medications, be sure to consult the physician who ordered them.  Some, such as Coumadin (Warfarin) are ordered to prevent or treat serious conditions such as "deep thrombosis", "pumonary embolisms", and other heart problems.  The amount of time that you may need off of the medication may also vary with the medication and the reason for which you were taking it.  If you are taking any of these medications, please make sure you notify your pain  physician before you undergo any procedures.         Occipital Nerve Block Patient Information  Description: The occipital nerves originate in the cervical (neck) spinal cord and travel upward through muscle and tissue to supply sensation to the back of the head and top of the scalp.  In addition, the nerves control some of the muscles of the scalp.  Occipital neuralgia is an irritation of these nerves which can cause headaches, numbness of the scalp, and neck discomfort.     The occipital nerve block will interrupt nerve transmission through these nerves and can relieve pain and spasm.  The block consists of insertion of a small needle under the skin in the back of the head to deposit local anesthetic (numbing medicine) and/or steroids around the nerve.  The entire block usually lasts less than 5 minutes.  Conditions which may be treated by occipital blocks:   Muscular pain and spasm of the scalp  Nerve irritation, back of the head  Headaches  Upper neck pain  Preparation for the injection:  12. Do not eat any solid food or dairy products within 8 hours of your appointment. 13. You may drink clear liquids up to 3 hours before appointment.  Clear liquids include water, black coffee, juice or soda.  No milk or cream please. 14. You may take your regular medication, including pain medications, with a sip of water before you appointment.  Diabetics should hold regular insulin (if taken separately) and take 1/2 normal NPH dose the morning of the procedure.  Carry some sugar containing items with you to your appointment. 15. A driver must accompany you and be prepared to drive you home after your procedure. 16. Bring all your current medications  with you. 17. An IV may be inserted and sedation may be given at the discretion of the physician. 18. A blood pressure cuff, EKG, and other monitors will often be applied during the procedure.  Some patients may need to have extra oxygen  administered for a short period. 66. You will be asked to provide medical information, including your allergies and medications, prior to the procedure.  We must know immediately if you are taking blood thinners (like Coumadin/Warfarin) or if you are allergic to IV iodine contrast (dye).  We must know if you could possible be pregnant.  20. Do not wear a high collared shirt or turtleneck.  Tie long hair up in the back if possible.  Possible side-effects:   Bleeding from needle site  Infection (rare, may require surgery)  Nerve injury (rare)  Hair on back of neck can be tinged with iodine scrub (this will wash out)  Light-headedness (temporary)  Pain at injection site (several days)  Decreased blood pressure (rare, temporary)  Seizure (very rare)  Call if you experience:   Hives or difficulty breathing ( go to the emergency room)  Inflammation or drainage at the injection site(s)  Please note:  Although the local anesthetic injected can often make your painful muscles or headache feel good for several hours after the injection, the pain may return.  It takes 3-7 days for steroids to work.  You may not notice any pain relief for at least one week.  If effective, we will often do a series of injections spaced 3-6 weeks apart to maximally decrease your pain.  If you have any questions, please call [phone] [facility]

## 2016-09-09 ENCOUNTER — Other Ambulatory Visit: Payer: Self-pay

## 2016-09-12 ENCOUNTER — Ambulatory Visit: Payer: BLUE CROSS/BLUE SHIELD | Attending: Pain Medicine | Admitting: Pain Medicine

## 2016-09-12 ENCOUNTER — Encounter: Payer: Self-pay | Admitting: Pain Medicine

## 2016-09-12 ENCOUNTER — Ambulatory Visit
Admission: RE | Admit: 2016-09-12 | Discharge: 2016-09-12 | Disposition: A | Discharge status: Home / Self Care | Payer: BLUE CROSS/BLUE SHIELD | Source: Ambulatory Visit | Attending: Pain Medicine | Admitting: Pain Medicine

## 2016-09-12 VITALS — BP 109/50 | HR 71 | Temp 97.8°F | Resp 18 | Ht 66.0 in | Wt 170.0 lb

## 2016-09-12 DIAGNOSIS — M545 Low back pain, unspecified: Secondary | ICD-10-CM | POA: Insufficient documentation

## 2016-09-12 DIAGNOSIS — M549 Dorsalgia, unspecified: Secondary | ICD-10-CM | POA: Insufficient documentation

## 2016-09-12 DIAGNOSIS — Z79899 Other long term (current) drug therapy: Secondary | ICD-10-CM | POA: Insufficient documentation

## 2016-09-12 DIAGNOSIS — G8929 Other chronic pain: Secondary | ICD-10-CM

## 2016-09-12 DIAGNOSIS — Z79891 Long term (current) use of opiate analgesic: Secondary | ICD-10-CM

## 2016-09-12 DIAGNOSIS — M5481 Occipital neuralgia: Secondary | ICD-10-CM | POA: Diagnosis not present

## 2016-09-12 DIAGNOSIS — R1032 Left lower quadrant pain: Secondary | ICD-10-CM | POA: Diagnosis not present

## 2016-09-12 DIAGNOSIS — M542 Cervicalgia: Secondary | ICD-10-CM

## 2016-09-12 DIAGNOSIS — R109 Unspecified abdominal pain: Secondary | ICD-10-CM

## 2016-09-12 DIAGNOSIS — F1190 Opioid use, unspecified, uncomplicated: Secondary | ICD-10-CM

## 2016-09-12 DIAGNOSIS — G894 Chronic pain syndrome: Secondary | ICD-10-CM

## 2016-09-12 DIAGNOSIS — M792 Neuralgia and neuritis, unspecified: Secondary | ICD-10-CM

## 2016-09-12 MED ORDER — MORPHINE SULFATE ER 30 MG PO TBCR: 30.0000 mg | EXTENDED_RELEASE_TABLET | Freq: Two times a day (BID) | ORAL | 0 refills | Status: DC

## 2016-09-12 MED ORDER — GABAPENTIN 300 MG PO CAPS: 900.0000 mg | ORAL_CAPSULE | Freq: Four times a day (QID) | ORAL | 2 refills | Status: DC

## 2016-09-12 NOTE — Progress Notes (Signed)
Nursing Pain Medication Assessment:  Safety precautions to be maintained throughout the outpatient stay will include: orient to surroundings, keep bed in low position, maintain call bell within reach at all times, provide assistance with transfer out of bed and ambulation.  Medication Inspection Compliance: Pill count conducted under aseptic conditions, in front of the patient. Neither the pills nor the bottle was removed from the patient's sight at any time. Once count was completed pills were immediately returned to the patient in their original bottle.  Medication: See above Pill/Patch Count: 39 of 60 pills remain Bottle Appearance: Standard pharmacy container. Clearly labeled. Filled Date: 01 / 22 / 2018 Last Medication intake:  today at 0900

## 2016-09-12 NOTE — Progress Notes (Signed)
Patient's Name: Kari Gibbs  MRN: [id number]  Referring Provider: Cari Caraway, MD  DOB: Dec 31, 1950  PCP: Cari Caraway, MD  DOS: 09/12/2016  Note by: Kathlen Brunswick. Dossie Arbour, MD  Service setting: Ambulatory outpatient  Specialty: Interventional Pain Management  Location: [facility] (AMB) Pain Management Facility    Patient type: Established   Primary Reason(s) for Visit: Encounter for prescription drug management (Level of risk: moderate) CC: Back Pain (mid and low and left) and Abdominal Pain (LLQ)  HPI  Kari Gibbs is a 66 y.o. year old, female patient, who comes today for a medication management evaluation. 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; S/P laparoscopic cholecystectomy Oct 2014; Fracture of rib of left side; Long term current use of opiate analgesic; Long term prescription opiate use; Opiate use (60 MME/day); Encounter for therapeutic drug level monitoring; Opiate dependence ([place]); Encounter for chronic pain management; Chronic pain syndrome; Chronic flank pain (Location of Primary Source of Pain) (Left); Chronic thoracic radicular pain (Left-sided); Opioid-induced constipation (OIC); Chronic occipital neuralgia (Left); Neuropathic pain; Neurogenic pain; History of rib fracture (Left 4th and 8th rib); Vertebral body hemangioma (T11); Metatarsalgia of right foot; Chronic abdominal pain (Bilateral) (L>R) (possible left-sided thoracic radiculopathy); Osteoarthritis; Return to work evaluation (FCE: Medium); Varicose veins of bilateral lower extremities with other complications; Varicose veins of right lower extremity with complications; Chronic neck pain (Bilateral) (L>R); Cervical facet syndrome (Bilateral) (L>R); Muscle spasticity; Tremors of nervous system; Muscle twitching; Cervical spondylosis; and Chronic bilateral low back pain without sciatica on her problem list. Her  primarily concern today is the Back Pain (mid and low and left) and Abdominal Pain (LLQ)  Pain Assessment: Self-Reported Pain Score: 3 /10             Reported level is compatible with observation.          Kari Gibbs was last seen on 09/02/2016 for medication management. During today's appointment we reviewed Kari Gibbs's chronic pain status, as well as her outpatient medication regimen.  The patient  reports that she does not use drugs. Her body mass index is 27.44 kg/m.  Further details on both, my assessment(s), as well as the proposed treatment plan, please see below.  Controlled Substance Pharmacotherapy Assessment REMS (Risk Evaluation and Mitigation Strategy)  Analgesic:MS Contin 30 mg every 12 hours (60 mg/day) MME/day:60 mg/day  Angelique Holm, RN  09/12/2016 10:05 AM  Sign at close encounter Nursing Pain Medication Assessment:  Safety precautions to be maintained throughout the outpatient stay will include: orient to surroundings, keep bed in low position, maintain call bell within reach at all times, provide assistance with transfer out of bed and ambulation.  Medication Inspection Compliance: Pill count conducted under aseptic conditions, in front of the patient. Neither the pills nor the bottle was removed from the patient's sight at any time. Once count was completed pills were immediately returned to the patient in their original bottle.  Medication: See above Pill/Patch Count: 39 of 60 pills remain Bottle Appearance: Standard pharmacy container. Clearly labeled. Filled Date: 01 / 22 / 2018 Last Medication intake:  today at 0900   Pharmacokinetics: Liberation and absorption (onset of action): WNL Distribution (time to peak effect): WNL Metabolism and excretion (duration of action): WNL         Pharmacodynamics: Desired effects: Analgesia: Kari Gibbs reports >50% benefit. Functional ability: Patient reports that medication allows her to accomplish basic  ADLs Clinically  meaningful improvement in function (CMIF): Sustained CMIF goals met Perceived effectiveness: Described as relatively effective, allowing for increase in activities of daily living (ADL) Undesirable effects: Side-effects or Adverse reactions: None reported Monitoring: [place] PMP: Online review of the past 88-monthperiod conducted. Compliant with practice rules and regulations List of all UDS test(s) done:  Lab Results  Component Value Date   TOXASSSELUR FINAL 01/04/2016   TOXASSSELUR FINAL 09/27/2015   TLenaFINAL 06/28/2015   Last UDS on record: ToxAssure Select 13  Date Value Ref Range Status  01/04/2016 FINAL  Final    Comment:    ==================================================================== TOXASSURE SELECT 13 (MW) ==================================================================== Test                             Result       Flag       Units Drug Present and Declared for Prescription Verification   Morphine                       10334        EXPECTED   ng/mg creat    Potential sources of large amounts of morphine in the absence of    codeine include administration of morphine or use of heroin.   Hydromorphone                  213          EXPECTED   ng/mg creat    Hydromorphone may be present as a metabolite of morphine;    concentrations of hydromorphone rarely exceed 5% of the morphine    concentration when this is the source of hydromorphone. ==================================================================== Test                      Result    Flag   Units      Ref Range   Creatinine              90               mg/dL      >=20 ==================================================================== Declared Medications:  The flagging and interpretation on this report are based on the  following declared medications.  Unexpected results may arise from  inaccuracies in the declared medications.  **Note: The testing scope of this panel includes  these medications:  Morphine  **Note: The testing scope of this panel does not include following  reported medications:  Chlorpheniramine  Doxycycline  Gabapentin  Iron (Ferrous Sulfate)  Lubiprostone  Magnesium  Metronidazole  Multivitamin (Prenatal Vitamin)  Naloxone  Omeprazole  Potassium  Supplement  Supplement (Probiotic)  Teriparatide  Triamcinolone acetonide  Valsartan  Venlafaxine  Vitamin D2 (Ergocalciferol) ==================================================================== For clinical consultation, please call ([phone] ====================================================================    UDS interpretation: Compliant          Medication Assessment Form: Reviewed. Patient indicates being compliant with therapy Treatment compliance: Compliant Risk Assessment Profile: Aberrant behavior: See prior evaluations. None observed or detected today Comorbid factors increasing risk of overdose: See prior notes. No additional risks detected today Risk of substance use disorder (SUD): Low Opioid Risk Tool (ORT) Total Score:    Interpretation Table:  Score <3 = Low Risk for SUD  Score between 4-7 = Moderate Risk for SUD  Score >8 = High Risk for Opioid Abuse   Risk Mitigation Strategies:  Patient Counseling: Covered Patient-Prescriber Agreement (PPA): Present and active  Notification  to other healthcare providers: Done  Pharmacologic Plan: No change in therapy, at this time  Laboratory Chemistry  Inflammation Markers Lab Results  Component Value Date   ESRSEDRATE 17 01/04/2016   CRP <0.5 01/04/2016   Renal Function Lab Results  Component Value Date   BUN 20 01/04/2016   CREATININE 0.76 01/04/2016   GFRAA >60 01/04/2016   GFRNONAA >60 01/04/2016   Hepatic Function Lab Results  Component Value Date   AST 33 01/04/2016   ALT 26 01/04/2016   ALBUMIN 4.2 01/04/2016   Electrolytes Lab Results  Component Value Date   NA 139 01/04/2016   K 4.6  01/04/2016   CL 99 (L) 01/04/2016   CALCIUM 9.7 01/04/2016   MG 2.1 01/04/2016   Pain Modulating Vitamins Lab Results  Component Value Date   25OHVITD1 49 01/04/2016   25OHVITD2 25 01/04/2016   25OHVITD3 24 01/04/2016   VITAMINB12 321 01/04/2016   Coagulation Parameters Lab Results  Component Value Date   PLT 238 11/18/2014   Cardiovascular Lab Results  Component Value Date   HGB 11.1 (L) 11/18/2014   HCT 34.1 (L) 11/18/2014   Note: Lab results reviewed.  Recent Diagnostic Imaging Review  Dg C-arm 1-60 Min-no Report  Result Date: 07/17/2016 There is no report for this exam.  Note: Imaging results reviewed.          Meds  The patient has a current medication list which includes the following prescription(s): buspirone, chlorpheniramine maleate, cholecalciferol, ferrous sulfate, gabapentin, magnesium, metronidazole, morphine, morphine, morphine, naloxone, omeprazole, potassium gluconate, premarin, prenatal vit-fe fum-fa-omega, align, teriparatide (recombinant), triamcinolone cream, valsartan, venlafaxine xr, and benefiber.  Current Outpatient Prescriptions on File Prior to Visit  Medication Sig  . busPIRone (BUSPAR) 15 MG tablet TAKE 1/3 TO 1/2 TAB AS NEEDED FOR ANXIETY UP TO TWICE A DAY  . CHLORPHENIRAMINE MALEATE PO Take 4 mg by mouth every 6 (six) hours as needed.  . cholecalciferol (VITAMIN D) 1000 units tablet Take 4,000 Units by mouth daily.  . ferrous sulfate 325 (65 FE) MG tablet Take 325 mg by mouth daily with breakfast.  . Magnesium 500 MG TABS Take 1 tablet by mouth 2 (two) times daily.  . metroNIDAZOLE (METROCREAM) 0.75 % cream APPLY ON THE SKIN AS DIRECTED. APPLY TO AFFECTED AREAS DAILY AS NEEDED.  . naloxone (NARCAN) 2 MG/2ML injection Inject content of syringe into thigh muscle. Call 911.  . omeprazole (PRILOSEC) 20 MG capsule Take 20 mg by mouth 2 (two) times daily.  . potassium gluconate 595 MG TABS tablet Take 595 mg by mouth daily as needed (low  potassium).  Marland Kitchen PREMARIN vaginal cream INSERT 1GRAM VAGINALLY 3 TIMES WEEKLY AT BEDTIME  . PRENATAL VIT-FE FUM-FA-OMEGA PO Take 1 tablet by mouth daily.  . Probiotic Product (ALIGN) 4 MG CAPS Take 1 capsule by mouth at bedtime. Per Pt.  . Teriparatide, Recombinant, (FORTEO) 600 MCG/2.4ML SOLN Inject 20 mcg into the skin daily.  Marland Kitchen triamcinolone cream (KENALOG) 0.1 % ON THE SKIN TWICE A DAY. APPLY TO AFFECTED AREAS TWICE A DAY AS NEEDED.  Marland Kitchen valsartan (DIOVAN) 40 MG tablet Take 40 mg by mouth every morning.  . venlafaxine XR (EFFEXOR-XR) 75 MG 24 hr capsule Take 225 mg by mouth every morning.  . Wheat Dextrin (BENEFIBER) POWD Stir 2 teaspoons of Benefiber into 4-8 oz of any non-carbonated beverage or soft food (hot or cold) TID.   No current facility-administered medications on file prior to visit.    ROS  Constitutional: Denies  any fever or chills Gastrointestinal: No reported hemesis, hematochezia, vomiting, or acute GI distress Musculoskeletal: Denies any acute onset joint swelling, redness, loss of ROM, or weakness Neurological: No reported episodes of acute onset apraxia, aphasia, dysarthria, agnosia, amnesia, paralysis, loss of coordination, or loss of consciousness  Allergies  Ms. Seeman is allergic to iohexol; ace inhibitors; losartan potassium; and neosporin [neomycin-bacitracin zn-polymyx].  [place]  Drug: Ms. Jeancharles  reports that she does not use drugs. Alcohol:  reports that she does not drink alcohol. Tobacco:  reports that she has never smoked. She has never used smokeless tobacco. 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 Family: family history includes Alzheimer's disease in her father; Hypertension in her mother.  Past Surgical History:  Procedure Laterality Date  . ABDOMINAL HYSTERECTOMY  1988   partial  . CHOLECYSTECTOMY N/A 05/18/2013   Procedure: LAPAROSCOPIC CHOLECYSTECTOMY WITH INTRAOPERATIVE CHOLANGIOGRAM/laparoscopy/ ;  Surgeon: Pedro Earls, MD;  Location: [facility];  Service: General;  Laterality: N/A;  . EXPLORATORY LAPAROTOMY  2012  . GASTRIC BYPASS  11/13/2009  . HERNIA REPAIR  2011   hiatal  . 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 TMJ- LEFT--ABOUT 10 YRS AGO--LEFT SIDE--PT NOW HAS SOME PAIN LEFT JAW Left    still has pain, scar tissue has been removed   Constitutional Exam  General appearance: Well nourished, well developed, and well hydrated. In no apparent acute distress Vitals:   09/12/16 1001  BP: (!) 109/50  Pulse: 71  Resp: 18  Temp: 97.8 F (36.6 C)  TempSrc: Oral  SpO2: 100%  Weight: 170 lb (77.1 kg)  Height: _0  (1.676 m)   BMI Assessment: Estimated body mass index is 27.44 kg/m as calculated from the following:   Height as of this encounter: _1  (1.676 m).   Weight as of this encounter: 170 lb (77.1 kg).  BMI interpretation table: BMI level Category Range association with higher incidence of chronic pain  <18 kg/m2 Underweight   18.5-24.9 kg/m2 Ideal body weight   25-29.9 kg/m2 Overweight Increased incidence by 20%  30-34.9 kg/m2 Obese (Class I) Increased incidence by 68%  35-39.9 kg/m2 Severe obesity (Class II) Increased incidence by 136%  >40 kg/m2 Extreme obesity (Class III) Increased incidence by 254%   BMI Readings from Last 4 Encounters:  09/12/16 27.44 kg/m  09/02/16 27.44 kg/m  08/19/16 28.50 kg/m  07/17/16 26.95 kg/m   Wt  Readings from Last 4 Encounters:  09/12/16 170 lb (77.1 kg)  09/02/16 170 lb (77.1 kg)  08/19/16 176 lb 9.6 oz (80.1 kg)  07/17/16 167 lb (75.8 kg)  Psych/Mental status: Alert, oriented x 3 (person, place, & time)       Eyes: PERLA Respiratory: No evidence of acute respiratory distress  Cervical Spine Exam  Inspection: No masses, redness, or swelling Alignment: Symmetrical Functional ROM: Unrestricted ROM Stability: No instability detected Muscle strength & Tone: Functionally intact Sensory: Unimpaired Palpation: Non-contributory  Upper Extremity (UE) Exam    Side: Right upper extremity  Side: Left upper extremity  Inspection: No masses, redness, swelling, or asymmetry  Inspection: No masses, redness, swelling, or asymmetry  Functional ROM: Unrestricted ROM          Functional ROM: Unrestricted ROM          Muscle strength & Tone: Functionally intact  Muscle strength & Tone: Functionally intact  Sensory: Unimpaired  Sensory: Unimpaired  Palpation: Non-contributory  Palpation: Non-contributory   Thoracic Spine Exam  Inspection: No masses, redness, or swelling Alignment: Symmetrical Functional ROM: Unrestricted ROM Stability: No instability detected Sensory: Unimpaired Muscle strength & Tone: Functionally intact Palpation: Non-contributory  Lumbar Spine Exam  Inspection: No masses, redness, or swelling Alignment: Symmetrical Functional ROM: Unrestricted ROM Stability: No instability detected Muscle strength & Tone: Functionally intact Sensory: Unimpaired Palpation: Non-contributory Provocative Tests: Lumbar Hyperextension and rotation test: evaluation deferred today       Patrick's Maneuver: evaluation deferred today              Gait & Posture Assessment  Ambulation: Unassisted Gait: Relatively normal for age and body habitus Posture: WNL   Lower Extremity Exam    Side: Right lower extremity  Side: Left lower extremity  Inspection: No masses, redness, swelling, or  asymmetry  Inspection: No masses, redness, swelling, or asymmetry  Functional ROM: Unrestricted ROM          Functional ROM: Unrestricted ROM          Muscle strength & Tone: Functionally intact  Muscle strength & Tone: Functionally intact  Sensory: Unimpaired  Sensory: Unimpaired  Palpation: Non-contributory  Palpation: Non-contributory   Assessment  Primary Diagnosis & Pertinent Problem List: The primary encounter diagnosis was Chronic pain syndrome. Diagnoses of Chronic occipital neuralgia (Left), Chronic flank pain (Location of Primary Source of Pain) (Left), Chronic neck pain (Bilateral) (L>R), Long term current use of opiate analgesic, Opiate use (60 MME/day), Neurogenic pain, and Chronic bilateral low back pain without sciatica were also pertinent to this visit.  Status Diagnosis  Controlled Controlled Controlled 1. Chronic pain syndrome   2. Chronic occipital neuralgia (Left)   3. Chronic flank pain (Location of Primary Source of Pain) (Left)   4. Chronic neck pain (Bilateral) (L>R)   5. Long term current use of opiate analgesic   6. Opiate use (60 MME/day)   7. Neurogenic pain   8. Chronic bilateral low back pain without sciatica      Plan of Care  Pharmacotherapy (Medications Ordered): Meds ordered this encounter  Medications  . morphine (MS CONTIN) 30 MG 12 hr tablet    Sig: Take 1 tablet (30 mg total) by mouth every 12 (twelve) hours.    Dispense:  60 tablet    Refill:  0    Do not place this medication, or any other prescription from our practice, on "Automatic Refill". Patient may have prescription filled one day early if pharmacy is closed on scheduled refill date. Do not fill until: 11/01/16 To last until: 12/01/16  . morphine (MS CONTIN) 30 MG 12 hr tablet    Sig: Take 1 tablet (30 mg total) by mouth every 12 (twelve) hours.    Dispense:  60 tablet    Refill:  0    Do not place this medication, or any other prescription from our practice, on "Automatic Refill".  Patient may have prescription filled one day early if pharmacy is closed on scheduled refill date. Do not fill until: 12/01/16 To last until: 12/31/16  . morphine (MS CONTIN) 30 MG 12 hr tablet  Sig: Take 1 tablet (30 mg total) by mouth every 12 (twelve) hours.    Dispense:  60 tablet    Refill:  0    Do not place this medication, or any other prescription from our practice, on "Automatic Refill". Patient may have prescription filled one day early if pharmacy is closed on scheduled refill date. Do not fill until: 10/02/16 To last until: 11/01/16  . gabapentin (NEURONTIN) 300 MG capsule    Sig: Take 3 capsules (900 mg total) by mouth 4 (four) times daily. May go up to 3 capsules 4 times a day. Titrate up slowly.    Dispense:  360 capsule    Refill:  2    Do not place this medication, or any other prescription from our practice, on "Automatic Refill". Patient may have prescription filled one day early if pharmacy is closed on scheduled refill date.   New Prescriptions   No medications on file   Medications administered today: Ms. Chick had no medications administered during this visit. Lab-work, procedure(s), and/or referral(s): Orders Placed This Encounter  Procedures  . DG Lumbar Spine Complete W/Bend   Imaging and/or referral(s): None  Interventional therapies: Planned, scheduled, and/or pending:   Not at this time.   Considering:   Left greater occipital nerve RFA  Diagnostic left C2 + TON nerve block  Possible left C2 + TON    Palliative PRN treatment(s):   Diagnostic left C2 + TON nerve block   Provider-requested follow-up: Return in about 3 months (around 12/10/2016) for (NP) Med-Mgmt, in addition, (PRN) procedure.  Future Appointments Date Time Provider [place]  09/16/2016 9:30 AM [facility] EEG TECH [facility] None  09/17/2016 10:00 AM Lady Saucier, RD [place] [facility]   Primary Care Physician: Cari Caraway, MD Location: [facility] Outpatient Pain Management  Facility Note by: Chivonne Rascon A. Dossie Arbour, M.D, DABA, DABAPM, DABPM, DABIPP, FIPP Date: 09/12/2016; Time: 12:01 PM  Pain Score Disclaimer: We use the NRS-11 scale. This is a self-reported, subjective measurement of pain severity with only modest accuracy. It is used primarily to identify changes within a particular patient. It must be understood that outpatient pain scales are significantly less accurate that those used for research, where they can be applied under ideal controlled circumstances with minimal exposure to variables. In reality, the score is likely to be a combination of pain intensity and pain affect, where pain affect describes the degree of emotional arousal or changes in action readiness caused by the sensory experience of pain. Factors such as social and work situation, setting, emotional state, anxiety levels, expectation, and prior pain experience may influence pain perception and show large inter-individual differences that may also be affected by time variables.  Patient instructions provided during this appointment: Patient Instructions   GENERAL RISKS AND COMPLICATIONS  What are the risk, side effects and possible complications? Generally speaking, most procedures are safe.  However, with any procedure there are risks, side effects, and the possibility of complications.  The risks and complications are dependent upon the sites that are lesioned, or the type of nerve block to be performed.  The closer the procedure is to the spine, the more serious the risks are.  Great care is taken when placing the radio frequency needles, block needles or lesioning probes, but sometimes complications can occur. 1. Infection: Any time there is an injection through the skin, there is a risk of infection.  This is why sterile conditions are used for these blocks.  There are four possible types  of infection. 1. Localized skin infection. 2. Central Nervous System Infection-This can be in the form of  Meningitis, which can be deadly. 3. Epidural Infections-This can be in the form of an epidural abscess, which can cause pressure inside of the spine, causing compression of the spinal cord with subsequent paralysis. This would require an emergency surgery to decompress, and there are no guarantees that the patient would recover from the paralysis. 4. Discitis-This is an infection of the intervertebral discs.  It occurs in about 1% of discography procedures.  It is difficult to treat and it may lead to surgery.        2. Pain: the needles have to go through skin and soft tissues, will cause soreness.       3. Damage to internal structures:  The nerves to be lesioned may be near blood vessels or    other nerves which can be potentially damaged.       4. Bleeding: Bleeding is more common if the patient is taking blood thinners such as  aspirin, Coumadin, Ticiid, Plavix, etc., or if he/she have some genetic predisposition  such as hemophilia. Bleeding into the spinal canal can cause compression of the spinal  cord with subsequent paralysis.  This would require an emergency surgery to  decompress and there are no guarantees that the patient would recover from the  paralysis.       5. Pneumothorax:  Puncturing of a lung is a possibility, every time a needle is introduced in  the area of the chest or upper back.  Pneumothorax refers to free air around the  collapsed lung(s), inside of the thoracic cavity (chest cavity).  Another two possible  complications related to a similar event would include: Hemothorax and Chylothorax.   These are variations of the Pneumothorax, where instead of air around the collapsed  lung(s), you may have blood or chyle, respectively.       6. Spinal headaches: They may occur with any procedures in the area of the spine.       7. Persistent CSF (Cerebro-Spinal Fluid) leakage: This is a rare problem, but may occur  with prolonged intrathecal or epidural catheters either due to the  formation of a fistulous  track or a dural tear.       8. Nerve damage: By working so close to the spinal cord, there is always a possibility of  nerve damage, which could be as serious as a permanent spinal cord injury with  paralysis.       9. Death:  Although rare, severe deadly allergic reactions known as "Anaphylactic  reaction" can occur to any of the medications used.      10. Worsening of the symptoms:  We can always make thing worse.  What are the chances of something like this happening? Chances of any of this occuring are extremely low.  By statistics, you have more of a chance of getting killed in a motor vehicle accident: while driving to the hospital than any of the above occurring .  Nevertheless, you should be aware that they are possibilities.  In general, it is similar to taking a shower.  Everybody knows that you can slip, hit your head and get killed.  Does that mean that you should not shower again?  Nevertheless always keep in mind that statistics do not mean anything if you happen to be on the wrong side of them.  Even if a procedure has a 1 (one) in a 1,000,000 (  million) chance of going wrong, it you happen to be that one..Also, keep in mind that by statistics, you have more of a chance of having something go wrong when taking medications.  Who should not have this procedure? If you are on a blood thinning medication (e.g. Coumadin, Plavix, see list of "Blood Thinners"), or if you have an active infection going on, you should not have the procedure.  If you are taking any blood thinners, please inform your physician.  How should I prepare for this procedure?  Do not eat or drink anything at least six hours prior to the procedure.  Bring a driver with you .  It cannot be a taxi.  Come accompanied by an adult that can drive you back, and that is strong enough to help you if your legs get weak or numb from the local anesthetic.  Take all of your medicines the morning of the  procedure with just enough water to swallow them.  If you have diabetes, make sure that you are scheduled to have your procedure done first thing in the morning, whenever possible.  If you have diabetes, take only half of your insulin dose and notify our nurse that you have done so as soon as you arrive at the clinic.  If you are diabetic, but only take blood sugar pills (oral hypoglycemic), then do not take them on the morning of your procedure.  You may take them after you have had the procedure.  Do not take aspirin or any aspirin-containing medications, at least eleven (11) days prior to the procedure.  They may prolong bleeding.  Wear loose fitting clothing that may be easy to take off and that you would not mind if it got stained with Betadine or blood.  Do not wear any jewelry or perfume  Remove any nail coloring.  It will interfere with some of our monitoring equipment.  NOTE: Remember that this is not meant to be interpreted as a complete list of all possible complications.  Unforeseen problems may occur.  BLOOD THINNERS The following drugs contain aspirin or other products, which can cause increased bleeding during surgery and should not be taken for 2 weeks prior to and 1 week after surgery.  If you should need take something for relief of minor pain, you may take acetaminophen which is found in Tylenol,m Datril, Anacin-3 and Panadol. It is not blood thinner. The products listed below are.  Do not take any of the products listed below in addition to any listed on your instruction sheet.  A.P.C or A.P.C with Codeine Codeine Phosphate Capsules #3 Ibuprofen Ridaura  ABC compound Congesprin Imuran rimadil  Advil Cope Indocin Robaxisal  Alka-Seltzer Effervescent Pain Reliever and Antacid Coricidin or Coricidin-D  Indomethacin Rufen  Alka-Seltzer plus Cold Medicine Cosprin Ketoprofen S-A-C Tablets  Anacin Analgesic Tablets or Capsules Coumadin Korlgesic Salflex  Anacin Extra Strength  Analgesic tablets or capsules CP-2 Tablets Lanoril Salicylate  Anaprox Cuprimine Capsules Levenox Salocol  Anexsia-D Dalteparin Magan Salsalate  Anodynos Darvon compound Magnesium Salicylate Sine-off  Ansaid Dasin Capsules Magsal Sodium Salicylate  Anturane Depen Capsules Marnal Soma  APF Arthritis pain formula Dewitt's Pills Measurin Stanback  Argesic Dia-Gesic Meclofenamic Sulfinpyrazone  Arthritis Bayer Timed Release Aspirin Diclofenac Meclomen Sulindac  Arthritis pain formula Anacin Dicumarol Medipren Supac  Analgesic (Safety coated) Arthralgen Diffunasal Mefanamic Suprofen  Arthritis Strength Bufferin Dihydrocodeine Mepro Compound Suprol  Arthropan liquid Dopirydamole Methcarbomol with Aspirin Synalgos  ASA tablets/Enseals Disalcid Micrainin Tagament  Ascriptin Doan's Midol  Talwin  Ascriptin A/D Dolene Mobidin Tanderil  Ascriptin Extra Strength Dolobid Moblgesic Ticlid  Ascriptin with Codeine Doloprin or Doloprin with Codeine Momentum Tolectin  Asperbuf Duoprin Mono-gesic Trendar  Aspergum Duradyne Motrin or Motrin IB Triminicin  Aspirin plain, buffered or enteric coated Durasal Myochrisine Trigesic  Aspirin Suppositories Easprin Nalfon Trillsate  Aspirin with Codeine Ecotrin Regular or Extra Strength Naprosyn Uracel  Atromid-S Efficin Naproxen Ursinus  Auranofin Capsules Elmiron Neocylate Vanquish  Axotal Emagrin Norgesic Verin  Azathioprine Empirin or Empirin with Codeine Normiflo Vitamin E  Azolid Emprazil Nuprin Voltaren  Bayer Aspirin plain, buffered or children's or timed BC Tablets or powders Encaprin Orgaran Warfarin Sodium  Buff-a-Comp Enoxaparin Orudis Zorpin  Buff-a-Comp with Codeine Equegesic Os-Cal-Gesic   Buffaprin Excedrin plain, buffered or Extra Strength Oxalid   Bufferin Arthritis Strength Feldene Oxphenbutazone   Bufferin plain or Extra Strength Feldene Capsules Oxycodone with Aspirin   Bufferin with Codeine Fenoprofen Fenoprofen Pabalate or Pabalate-SF    Buffets II Flogesic Panagesic   Buffinol plain or Extra Strength Florinal or Florinal with Codeine Panwarfarin   Buf-Tabs Flurbiprofen Penicillamine   Butalbital Compound Four-way cold tablets Penicillin   Butazolidin Fragmin Pepto-Bismol   Carbenicillin Geminisyn Percodan   Carna Arthritis Reliever Geopen Persantine   Carprofen Gold's salt Persistin   Chloramphenicol Goody's Phenylbutazone   Chloromycetin Haltrain Piroxlcam   Clmetidine heparin Plaquenil   Cllnoril Hyco-pap Ponstel   Clofibrate Hydroxy chloroquine Propoxyphen         Before stopping any of these medications, be sure to consult the physician who ordered them.  Some, such as Coumadin (Warfarin) are ordered to prevent or treat serious conditions such as "deep thrombosis", "pumonary embolisms", and other heart problems.  The amount of time that you may need off of the medication may also vary with the medication and the reason for which you were taking it.  If you are taking any of these medications, please make sure you notify your pain physician before you undergo any procedures.         Celiac Plexus Block Patient Information  Description: The celiac plexus is a group of nerves which are part of the sympathetic nervous system.  These nerves supply organs in the abdomen and pelvis.  Specific organs supplied with sensation by the celiac plexus include the stomach, liver, gallbladder, pancreas, kidneys and part of the gut.   The celiac plexus is located on both sides of the aorta at approximately the level of the first lumbar vertebral body.  The block will be performed with you lying on your abdomen with a pillow underneath.  Using direct x-ray guidance, the celiac plexus will be located on both sides of the spine.  Numbing medicine will be used to deaden the skin prior to needle insertion.  In most cases, a small amount of sedation can be given by IV prior to the numbing medicine.  Two small needles will be place near  the celiac plexus and local anesthetic and steroid will be injected.  The entire block usually last about 15-25 minutes.  Conditions which may be treated by celiac plexus block:   Acute and chronic pancreatitis  Pain from liver or pancreatic cancer  Pain from Crohn's disease  Other types of abdominal or flank pain  Preparation for the injection:  1. Do not eat any solid food or dairy products within 8 hours of your appointment. 2. You may drink clear liquids up to 3 hours before appointment.  Clear liquids include water, black coffee,  juice or soda.  No milk or cream please. 3. You may take your regular medication, including pain medications, with a sip of water before your appointment.  Diabetics should hold regular insulin (if taken separately) and take 1/2 normal NPH dose in the morning of the procedure.  Carry some sugar containing items with you to your appointment. 4. A driver must accompany you and be prepared to drive you home after your procedure. 5. Bring all your current medications with you. 6. An IV may be inserted and sedation may be given at the discretion of the physician. 7. A blood pressure cuff, EKG, and other monitors will often be applied during the procedure.  Some patients may need to have extra oxygen administered for a short period. 8. You will be asked to provide medical information, including your allergies and medications, prior to the procedure.  We must know immediately if you are taking blood thinners (like Coumadin/Warfarin) or if you are allergic to IV iodine contrast (dye).  We must know if you could possible be pregnant.  Possible side-effects:   Bleeding from needle site or deeper  Infection (rarre, can require surgery)  Nerve injury (rare)  Numbness & tingling (temporary)  Collapsed lung (rare)  Spinal headache ( a headache worse with upright posture)  Light-headedness (temporary)  Pain at injection site (several days)  Decreased blood  pressure (temporary)  Weakness in legs (temporary)  Seizure or other drug reaction (rare)  Call if you experience:   Fever/chills associated with headache or increased back/neck pain  Headache worsened by an upright position  New onset weakness or numbness of an extremity below the injection site.  Hives or difficulty breathing (go to the emergency room)  Inflammation or drainage at the injection site.  New onset diarrhea lasting more than 2 weeks.  New symptoms which are concerning to you  Please note:  If effective, we will often do a series of 2-3 injections spaced 3-6 weeks apart to maximally decrease your pain.  If initial series is effective, you may be a candidate for a more permanent block of the celiac plexus. .  If you have questions, please call [phone] [facility]

## 2016-09-12 NOTE — Patient Instructions (Addendum)
GENERAL RISKS AND COMPLICATIONS  What are the risk, side effects and possible complications? Generally speaking, most procedures are safe.  However, with any procedure there are risks, side effects, and the possibility of complications.  The risks and complications are dependent upon the sites that are lesioned, or the type of nerve block to be performed.  The closer the procedure is to the spine, the more serious the risks are.  Great care is taken when placing the radio frequency needles, block needles or lesioning probes, but sometimes complications can occur. 1. Infection: Any time there is an injection through the skin, there is a risk of infection.  This is why sterile conditions are used for these blocks.  There are four possible types of infection. 1. Localized skin infection. 2. Central Nervous System Infection-This can be in the form of Meningitis, which can be deadly. 3. Epidural Infections-This can be in the form of an epidural abscess, which can cause pressure inside of the spine, causing compression of the spinal cord with subsequent paralysis. This would require an emergency surgery to decompress, and there are no guarantees that the patient would recover from the paralysis. 4. Discitis-This is an infection of the intervertebral discs.  It occurs in about 1% of discography procedures.  It is difficult to treat and it may lead to surgery.        2. Pain: the needles have to go through skin and soft tissues, will cause soreness.       3. Damage to internal structures:  The nerves to be lesioned may be near blood vessels or    other nerves which can be potentially damaged.       4. Bleeding: Bleeding is more common if the patient is taking blood thinners such as  aspirin, Coumadin, Ticiid, Plavix, etc., or if he/she have some genetic predisposition  such as hemophilia. Bleeding into the spinal canal can cause compression of the spinal  cord with subsequent paralysis.  This would require an  emergency surgery to  decompress and there are no guarantees that the patient would recover from the  paralysis.       5. Pneumothorax:  Puncturing of a lung is a possibility, every time a needle is introduced in  the area of the chest or upper back.  Pneumothorax refers to free air around the  collapsed lung(s), inside of the thoracic cavity (chest cavity).  Another two possible  complications related to a similar event would include: Hemothorax and Chylothorax.   These are variations of the Pneumothorax, where instead of air around the collapsed  lung(s), you may have blood or chyle, respectively.       6. Spinal headaches: They may occur with any procedures in the area of the spine.       7. Persistent CSF (Cerebro-Spinal Fluid) leakage: This is a rare problem, but may occur  with prolonged intrathecal or epidural catheters either due to the formation of a fistulous  track or a dural tear.       8. Nerve damage: By working so close to the spinal cord, there is always a possibility of  nerve damage, which could be as serious as a permanent spinal cord injury with  paralysis.       9. Death:  Although rare, severe deadly allergic reactions known as "Anaphylactic  reaction" can occur to any of the medications used.      10. Worsening of the symptoms:  We can always make thing worse.    What are the chances of something like this happening? Chances of any of this occuring are extremely low.  By statistics, you have more of a chance of getting killed in a motor vehicle accident: while driving to the hospital than any of the above occurring .  Nevertheless, you should be aware that they are possibilities.  In general, it is similar to taking a shower.  Everybody knows that you can slip, hit your head and get killed.  Does that mean that you should not shower again?  Nevertheless always keep in mind that statistics do not mean anything if you happen to be on the wrong side of them.  Even if a procedure has a 1  (one) in a 1,000,000 (million) chance of going wrong, it you happen to be that one..Also, keep in mind that by statistics, you have more of a chance of having something go wrong when taking medications.  Who should not have this procedure? If you are on a blood thinning medication (e.g. Coumadin, Plavix, see list of "Blood Thinners"), or if you have an active infection going on, you should not have the procedure.  If you are taking any blood thinners, please inform your physician.  How should I prepare for this procedure?  Do not eat or drink anything at least six hours prior to the procedure.  Bring a driver with you .  It cannot be a taxi.  Come accompanied by an adult that can drive you back, and that is strong enough to help you if your legs get weak or numb from the local anesthetic.  Take all of your medicines the morning of the procedure with just enough water to swallow them.  If you have diabetes, make sure that you are scheduled to have your procedure done first thing in the morning, whenever possible.  If you have diabetes, take only half of your insulin dose and notify our nurse that you have done so as soon as you arrive at the clinic.  If you are diabetic, but only take blood sugar pills (oral hypoglycemic), then do not take them on the morning of your procedure.  You may take them after you have had the procedure.  Do not take aspirin or any aspirin-containing medications, at least eleven (11) days prior to the procedure.  They may prolong bleeding.  Wear loose fitting clothing that may be easy to take off and that you would not mind if it got stained with Betadine or blood.  Do not wear any jewelry or perfume  Remove any nail coloring.  It will interfere with some of our monitoring equipment.  NOTE: Remember that this is not meant to be interpreted as a complete list of all possible complications.  Unforeseen problems may occur.  BLOOD THINNERS The following drugs  contain aspirin or other products, which can cause increased bleeding during surgery and should not be taken for 2 weeks prior to and 1 week after surgery.  If you should need take something for relief of minor pain, you may take acetaminophen which is found in Tylenol,m Datril, Anacin-3 and Panadol. It is not blood thinner. The products listed below are.  Do not take any of the products listed below in addition to any listed on your instruction sheet.  A.P.C or A.P.C with Codeine Codeine Phosphate Capsules #3 Ibuprofen Ridaura  ABC compound Congesprin Imuran rimadil  Advil Cope Indocin Robaxisal  Alka-Seltzer Effervescent Pain Reliever and Antacid Coricidin or Coricidin-D  Indomethacin Rufen    Alka-Seltzer plus Cold Medicine Cosprin Ketoprofen S-A-C Tablets  Anacin Analgesic Tablets or Capsules Coumadin Korlgesic Salflex  Anacin Extra Strength Analgesic tablets or capsules CP-2 Tablets Lanoril Salicylate  Anaprox Cuprimine Capsules Levenox Salocol  Anexsia-D Dalteparin Magan Salsalate  Anodynos Darvon compound Magnesium Salicylate Sine-off  Ansaid Dasin Capsules Magsal Sodium Salicylate  Anturane Depen Capsules Marnal Soma  APF Arthritis pain formula Dewitt's Pills Measurin Stanback  Argesic Dia-Gesic Meclofenamic Sulfinpyrazone  Arthritis Bayer Timed Release Aspirin Diclofenac Meclomen Sulindac  Arthritis pain formula Anacin Dicumarol Medipren Supac  Analgesic (Safety coated) Arthralgen Diffunasal Mefanamic Suprofen  Arthritis Strength Bufferin Dihydrocodeine Mepro Compound Suprol  Arthropan liquid Dopirydamole Methcarbomol with Aspirin Synalgos  ASA tablets/Enseals Disalcid Micrainin Tagament  Ascriptin Doan's Midol Talwin  Ascriptin A/D Dolene Mobidin Tanderil  Ascriptin Extra Strength Dolobid Moblgesic Ticlid  Ascriptin with Codeine Doloprin or Doloprin with Codeine Momentum Tolectin  Asperbuf Duoprin Mono-gesic Trendar  Aspergum Duradyne Motrin or Motrin IB Triminicin  Aspirin  plain, buffered or enteric coated Durasal Myochrisine Trigesic  Aspirin Suppositories Easprin Nalfon Trillsate  Aspirin with Codeine Ecotrin Regular or Extra Strength Naprosyn Uracel  Atromid-S Efficin Naproxen Ursinus  Auranofin Capsules Elmiron Neocylate Vanquish  Axotal Emagrin Norgesic Verin  Azathioprine Empirin or Empirin with Codeine Normiflo Vitamin E  Azolid Emprazil Nuprin Voltaren  Bayer Aspirin plain, buffered or children's or timed BC Tablets or powders Encaprin Orgaran Warfarin Sodium  Buff-a-Comp Enoxaparin Orudis Zorpin  Buff-a-Comp with Codeine Equegesic Os-Cal-Gesic   Buffaprin Excedrin plain, buffered or Extra Strength Oxalid   Bufferin Arthritis Strength Feldene Oxphenbutazone   Bufferin plain or Extra Strength Feldene Capsules Oxycodone with Aspirin   Bufferin with Codeine Fenoprofen Fenoprofen Pabalate or Pabalate-SF   Buffets II Flogesic Panagesic   Buffinol plain or Extra Strength Florinal or Florinal with Codeine Panwarfarin   Buf-Tabs Flurbiprofen Penicillamine   Butalbital Compound Four-way cold tablets Penicillin   Butazolidin Fragmin Pepto-Bismol   Carbenicillin Geminisyn Percodan   Carna Arthritis Reliever Geopen Persantine   Carprofen Gold's salt Persistin   Chloramphenicol Goody's Phenylbutazone   Chloromycetin Haltrain Piroxlcam   Clmetidine heparin Plaquenil   Cllnoril Hyco-pap Ponstel   Clofibrate Hydroxy chloroquine Propoxyphen         Before stopping any of these medications, be sure to consult the physician who ordered them.  Some, such as Coumadin (Warfarin) are ordered to prevent or treat serious conditions such as "deep thrombosis", "pumonary embolisms", and other heart problems.  The amount of time that you may need off of the medication may also vary with the medication and the reason for which you were taking it.  If you are taking any of these medications, please make sure you notify your pain physician before you undergo any  procedures.         Celiac Plexus Block Patient Information  Description: The celiac plexus is a group of nerves which are part of the sympathetic nervous system.  These nerves supply organs in the abdomen and pelvis.  Specific organs supplied with sensation by the celiac plexus include the stomach, liver, gallbladder, pancreas, kidneys and part of the gut.   The celiac plexus is located on both sides of the aorta at approximately the level of the first lumbar vertebral body.  The block will be performed with you lying on your abdomen with a pillow underneath.  Using direct x-ray guidance, the celiac plexus will be located on both sides of the spine.  Numbing medicine will be used to deaden the skin prior to needle  insertion.  In most cases, a small amount of sedation can be given by IV prior to the numbing medicine.  Two small needles will be place near the celiac plexus and local anesthetic and steroid will be injected.  The entire block usually last about 15-25 minutes.  Conditions which may be treated by celiac plexus block:   Acute and chronic pancreatitis  Pain from liver or pancreatic cancer  Pain from Crohn's disease  Other types of abdominal or flank pain  Preparation for the injection:  1. Do not eat any solid food or dairy products within 8 hours of your appointment. 2. You may drink clear liquids up to 3 hours before appointment.  Clear liquids include water, black coffee, juice or soda.  No milk or cream please. 3. You may take your regular medication, including pain medications, with a sip of water before your appointment.  Diabetics should hold regular insulin (if taken separately) and take 1/2 normal NPH dose in the morning of the procedure.  Carry some sugar containing items with you to your appointment. 4. A driver must accompany you and be prepared to drive you home after your procedure. 5. Bring all your current medications with you. 6. An IV may be inserted and  sedation may be given at the discretion of the physician. 7. A blood pressure cuff, EKG, and other monitors will often be applied during the procedure.  Some patients may need to have extra oxygen administered for a short period. 8. You will be asked to provide medical information, including your allergies and medications, prior to the procedure.  We must know immediately if you are taking blood thinners (like Coumadin/Warfarin) or if you are allergic to IV iodine contrast (dye).  We must know if you could possible be pregnant.  Possible side-effects:   Bleeding from needle site or deeper  Infection (rarre, can require surgery)  Nerve injury (rare)  Numbness & tingling (temporary)  Collapsed lung (rare)  Spinal headache ( a headache worse with upright posture)  Light-headedness (temporary)  Pain at injection site (several days)  Decreased blood pressure (temporary)  Weakness in legs (temporary)  Seizure or other drug reaction (rare)  Call if you experience:   Fever/chills associated with headache or increased back/neck pain  Headache worsened by an upright position  New onset weakness or numbness of an extremity below the injection site.  Hives or difficulty breathing (go to the emergency room)  Inflammation or drainage at the injection site.  New onset diarrhea lasting more than 2 weeks.  New symptoms which are concerning to you  Please note:  If effective, we will often do a series of 2-3 injections spaced 3-6 weeks apart to maximally decrease your pain.  If initial series is effective, you may be a candidate for a more permanent block of the celiac plexus. .  If you have questions, please call [phone] [facility]

## 2016-09-16 ENCOUNTER — Ambulatory Visit (INDEPENDENT_AMBULATORY_CARE_PROVIDER_SITE_OTHER): Payer: BLUE CROSS/BLUE SHIELD | Admitting: Neurology

## 2016-09-16 DIAGNOSIS — G253 Myoclonus: Secondary | ICD-10-CM | POA: Diagnosis not present

## 2016-09-17 ENCOUNTER — Ambulatory Visit: Payer: Self-pay | Admitting: Dietician

## 2016-09-18 ENCOUNTER — Telehealth: Payer: Self-pay

## 2016-09-18 NOTE — Telephone Encounter (Signed)
-----   Message from Pieter Partridge, DO sent at 09/18/2016  2:42 PM EST ----- I reviewed her EEG.  Her body jerks are not seizures.  As we discussed at her visit, I suspect it is related to stress.

## 2016-09-18 NOTE — Telephone Encounter (Signed)
Called patient. Gave EEG results. Patient verbalized understanding.   

## 2016-09-18 NOTE — Progress Notes (Signed)
ELECTROENCEPHALOGRAM REPORT  Dates of Recording: 09/16/2016 at 10:10 AM to 09/18/2016 at 10:02 AM  Patient's Name: Kari Gibbs MRN: [id number] Date of Birth: [birth year]  Procedure: 48-hour ambulatory EEG  History:  66 year old female who is having myoclonic jerking  Medications: BuSpar, gabapentin, MS Contin, Narcan, Effexor  Technical Summary: This is a 48-hour multichannel digital EEG recording measured by the international 10-20 system with electrodes applied with paste and impedances below 5000 ohms performed as portable with EKG monitoring.  The digital EEG was referentially recorded, reformatted, and digitally filtered in a variety of bipolar and referential montages for optimal display.    DESCRIPTION OF RECORDING: During maximal wakefulness, the background activity consisted of a symmetric 9Hz  posterior dominant rhythm which was reactive to eye opening.  There were no epileptiform discharges or focal slowing seen in wakefulness.  During the recording, the patient progresses through wakefulness, drowsiness, and Stage 2 sleep.  Again, there were no epileptiform discharges seen.  Events:  The patient exhibited multiple habitual spells of upper body myoclonic jerks.  There were no electrographic seizures seen.  EKG lead was unremarkable.  IMPRESSION: This 48-hour ambulatory EEG study is normal.  The patient's habitual myoclonic jerks are not epileptogenic.    Metta Clines, DO

## 2016-10-17 ENCOUNTER — Ambulatory Visit: Payer: BLUE CROSS/BLUE SHIELD | Admitting: Psychology

## 2016-10-25 ENCOUNTER — Ambulatory Visit: Payer: BLUE CROSS/BLUE SHIELD | Admitting: Psychology

## 2016-10-31 ENCOUNTER — Telehealth: Payer: Self-pay | Admitting: Neurology

## 2016-10-31 NOTE — Telephone Encounter (Signed)
Called patient. Advised will be on the lookout for forms.

## 2016-10-31 NOTE — Telephone Encounter (Signed)
PT called regarding her long term disability forms and needed to see if they were received

## 2016-11-20 ENCOUNTER — Telehealth: Payer: Self-pay | Admitting: Neurology

## 2016-11-20 NOTE — Telephone Encounter (Signed)
Caller: PT  Urgent? No  Reason for the call: PT wanted to know if her disability papers have been faxed in to social security

## 2016-11-21 ENCOUNTER — Telehealth: Payer: Self-pay | Admitting: Neurology

## 2016-11-21 NOTE — Telephone Encounter (Signed)
Spoke to patient. She says her disability papers should have been mailed to our office. Advised do not think forms received and would recommend her case worker to either fax to our office or mail to her home and she drop them off. Patient agreed.

## 2016-11-21 NOTE — Telephone Encounter (Signed)
Returned call. Confirmed received message.

## 2016-11-21 NOTE — Telephone Encounter (Signed)
Patient is returning your call. Please call back on cell #. She said paper work was going to be faxed over today. Thanks

## 2016-12-09 ENCOUNTER — Ambulatory Visit (INDEPENDENT_AMBULATORY_CARE_PROVIDER_SITE_OTHER): Payer: Medicare Other | Admitting: Psychology

## 2016-12-09 DIAGNOSIS — F411 Generalized anxiety disorder: Secondary | ICD-10-CM | POA: Diagnosis not present

## 2016-12-10 ENCOUNTER — Ambulatory Visit: Payer: BLUE CROSS/BLUE SHIELD | Admitting: Nurse Practitioner

## 2016-12-11 ENCOUNTER — Ambulatory Visit: Payer: Medicare Other | Attending: Nurse Practitioner | Admitting: Nurse Practitioner

## 2016-12-11 ENCOUNTER — Encounter: Payer: Self-pay | Admitting: Nurse Practitioner

## 2016-12-11 VITALS — BP 125/69 | HR 78 | Temp 97.6°F | Resp 18 | Ht 66.0 in | Wt 185.0 lb

## 2016-12-11 DIAGNOSIS — M1990 Unspecified osteoarthritis, unspecified site: Secondary | ICD-10-CM | POA: Diagnosis not present

## 2016-12-11 DIAGNOSIS — Z79899 Other long term (current) drug therapy: Secondary | ICD-10-CM | POA: Insufficient documentation

## 2016-12-11 DIAGNOSIS — R1012 Left upper quadrant pain: Secondary | ICD-10-CM | POA: Diagnosis not present

## 2016-12-11 DIAGNOSIS — Z8781 Personal history of (healed) traumatic fracture: Secondary | ICD-10-CM

## 2016-12-11 DIAGNOSIS — M792 Neuralgia and neuritis, unspecified: Secondary | ICD-10-CM | POA: Diagnosis not present

## 2016-12-11 DIAGNOSIS — M5414 Radiculopathy, thoracic region: Secondary | ICD-10-CM

## 2016-12-11 DIAGNOSIS — Z79891 Long term (current) use of opiate analgesic: Secondary | ICD-10-CM

## 2016-12-11 DIAGNOSIS — G894 Chronic pain syndrome: Secondary | ICD-10-CM | POA: Diagnosis not present

## 2016-12-11 DIAGNOSIS — M159 Polyosteoarthritis, unspecified: Secondary | ICD-10-CM

## 2016-12-11 MED ORDER — GABAPENTIN 300 MG PO CAPS: 900.0000 mg | ORAL_CAPSULE | Freq: Two times a day (BID) | ORAL | 2 refills | Status: DC

## 2016-12-11 MED ORDER — MORPHINE SULFATE ER 30 MG PO TBCR: 30.0000 mg | EXTENDED_RELEASE_TABLET | Freq: Two times a day (BID) | ORAL | 0 refills | Status: DC

## 2016-12-11 NOTE — Progress Notes (Signed)
Nursing Pain Medication Assessment:  Safety precautions to be maintained throughout the outpatient stay will include: orient to surroundings, keep bed in low position, maintain call bell within reach at all times, provide assistance with transfer out of bed and ambulation.  Medication Inspection Compliance: Pill count conducted under aseptic conditions, in front of the patient. Neither the pills nor the bottle was removed from the patient's sight at any time. Once count was completed pills were immediately returned to the patient in their original bottle.  Medication: Morphine ER (MSContin) Pill/Patch Count: 40 of 60 pills remain Pill/Patch Appearance: Markings consistent with prescribed medication Bottle Appearance: Standard pharmacy container. Clearly labeled. Filled Date:04 / 22 / 2018 Last Medication intake:  Yesterday

## 2016-12-11 NOTE — Progress Notes (Signed)
Patient's Name: Kari Gibbs  MRN: [id number]  Referring Provider: Gweneth Dimitri, MD  DOB: 1951/03/08  PCP: Gweneth Dimitri, MD  DOS: 12/11/2016  Note by: Barbette Merino NP  Service setting: Ambulatory outpatient  Specialty: Interventional Pain Management  Location: [facility] (AMB) Pain Management Facility    Patient type: Established    Primary Reason(s) for Visit: Encounter for prescription drug management (Level of risk: moderate) CC: Abdominal Pain (left upper)  HPI  Kari Gibbs is a 66 y.o. year old, female patient, who comes today for a medication management evaluation. 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; S/P laparoscopic cholecystectomy Oct 2014; Fracture of rib of left side; Long term current use of opiate analgesic; Long term prescription opiate use; Opiate use (60 MME/day); Encounter for therapeutic drug level monitoring; Opiate dependence (HCC); Encounter for chronic pain management; Chronic pain syndrome; Chronic flank pain (Location of Primary Source of Pain) (Left); Chronic thoracic radicular pain (Left-sided); Opioid-induced constipation (OIC); Chronic occipital neuralgia (Left); Neuropathic pain; Neurogenic pain; History of rib fracture (Left 4th and 8th rib); Vertebral body hemangioma (T11); Metatarsalgia of right foot; Chronic abdominal pain (Bilateral) (L>R) (possible left-sided thoracic radiculopathy); Osteoarthritis; Return to work evaluation (FCE: Medium); Varicose veins of bilateral lower extremities with other complications; Varicose veins of right lower extremity with complications; Chronic neck pain (Bilateral) (L>R); Cervical facet syndrome (Bilateral) (L>R); Muscle spasticity; Tremors of nervous system; Muscle twitching; Cervical spondylosis; and Chronic bilateral low back pain without sciatica on her problem list. Her primarily concern today is the  Abdominal Pain (left upper)  Pain Assessment: Self-Reported Pain Score: 0-No pain/10             Reported level is compatible with observation.          [name] was last scheduled for an appointment on 65/22/2018 for medication management. During today's appointment we reviewed Kari Gibbs's chronic pain status, as well as her outpatient medication regimen. She is has a history of abdominal pain. She has constant soreness that radiates from the front to the back. She has history of a rib fracutre but the acutal date of injury. She admits that she does have some chagnes in her breathing secondary to this fracture but it comes and goes.  She also has history of bilateral knee pain. She has been followed by ortho for this in the past. She has had injections but is unsure of the name. She admits that she is having problems going up and down the stairs. She has an apt but will follow up with clinic before any interventions.  She may be needing oral surgery in the near future.   The patient  reports that she does not use drugs. Her body mass index is 29.86 kg/m.  Further details on both, my assessment(s), as well as the proposed treatment plan, please see below.  Controlled Substance Pharmacotherapy Assessment REMS (Risk Evaluation and Mitigation Strategy)  Analgesic:MS Contin 30 mg every 12 hours (60 mg/day) MME/day:60 mg/day  Denton Meek, RN  12/11/2016  8:10 AM  Signed Nursing Pain Medication Assessment:  Safety precautions to be maintained throughout the outpatient stay will include: orient to surroundings, keep bed in low position, maintain call bell within reach at all times, provide assistance with transfer out of bed and ambulation.  Medication Inspection Compliance: Pill count conducted under aseptic conditions, in front of the patient. Neither the pills nor the bottle was  removed from the patient's sight at any time. Once count was completed pills were immediately returned to the  patient in their original bottle.  Medication: Morphine ER (MSContin) Pill/Patch Count: 40 of 60 pills remain Pill/Patch Appearance: Markings consistent with prescribed medication Bottle Appearance: Standard pharmacy container. Clearly labeled. Filled Date:04 / 22 / 2018 Last Medication intake:  Yesterday   Pharmacokinetics: Liberation and absorption (onset of action): WNL Distribution (time to peak effect): WNL Metabolism and excretion (duration of action): WNL         Pharmacodynamics: Desired effects: Analgesia: [name] reports >50% benefit. Functional ability: Patient reports that medication allows her to accomplish basic ADLs Clinically meaningful improvement in function (CMIF): Sustained CMIF goals met Perceived effectiveness: Described as relatively effective, allowing for increase in activities of daily living (ADL) Undesirable effects: Side-effects or Adverse reactions: None reported Monitoring: [place] PMP: Online review of the past 20-monthperiod conducted. Compliant with practice rules and regulations List of all UDS test(s) done:  Lab Results  Component Value Date   TOXASSSELUR FINAL 01/04/2016   TOXASSSELUR FINAL 09/27/2015   THamletFINAL 06/28/2015   Last UDS on record: ToxAssure Select 13  Date Value Ref Range Status  01/04/2016 FINAL  Final    Comment:    ==================================================================== TOXASSURE SELECT 13 (MW) ==================================================================== Test                             Result       Flag       Units Drug Present and Declared for Prescription Verification   Morphine                       10334        EXPECTED   ng/mg creat    Potential sources of large amounts of morphine in the absence of    codeine include administration of morphine or use of heroin.   Hydromorphone                  213          EXPECTED   ng/mg creat    Hydromorphone may be present as a metabolite of  morphine;    concentrations of hydromorphone rarely exceed 5% of the morphine    concentration when this is the source of hydromorphone. ==================================================================== Test                      Result    Flag   Units      Ref Range   Creatinine              90               mg/dL      >=20 ==================================================================== Declared Medications:  The flagging and interpretation on this report are based on the  following declared medications.  Unexpected results may arise from  inaccuracies in the declared medications.  **Note: The testing scope of this panel includes these medications:  Morphine  **Note: The testing scope of this panel does not include following  reported medications:  Chlorpheniramine  Doxycycline  Gabapentin  Iron (Ferrous Sulfate)  Lubiprostone  Magnesium  Metronidazole  Multivitamin (Prenatal Vitamin)  Naloxone  Omeprazole  Potassium  Supplement  Supplement (Probiotic)  Teriparatide  Triamcinolone acetonide  Valsartan  Venlafaxine  Vitamin D2 (Ergocalciferol) ==================================================================== For clinical consultation, please call ([phone] ====================================================================  UDS interpretation: Compliant          Medication Assessment Form: Reviewed. Patient indicates being compliant with therapy Treatment compliance: Compliant Risk Assessment Profile: Aberrant behavior: See prior evaluations. None observed or detected today Comorbid factors increasing risk of overdose: See prior notes. No additional risks detected today Risk of substance use disorder (SUD): Low Opioid Risk Tool (ORT) Total Score: 1  Interpretation Table:  Score <3 = Low Risk for SUD  Score between 4-7 = Moderate Risk for SUD  Score >8 = High Risk for Opioid Abuse   Risk Mitigation Strategies:  Patient Counseling:  Covered Patient-Prescriber Agreement (PPA): Present and active  Notification to other healthcare providers: Done  Pharmacologic Plan: No change in therapy, at this time  Laboratory Chemistry  Inflammation Markers Lab Results  Component Value Date   CRP <0.5 01/04/2016   ESRSEDRATE 17 01/04/2016   (CRP: Acute Phase) (ESR: Chronic Phase) Renal Function Markers Lab Results  Component Value Date   BUN 20 01/04/2016   CREATININE 0.76 01/04/2016   GFRAA >60 01/04/2016   GFRNONAA >60 01/04/2016   Hepatic Function Markers Lab Results  Component Value Date   AST 33 01/04/2016   ALT 26 01/04/2016   ALBUMIN 4.2 01/04/2016   ALKPHOS 97 01/04/2016   Electrolytes Lab Results  Component Value Date   NA 139 01/04/2016   K 4.6 01/04/2016   CL 99 (L) 01/04/2016   CALCIUM 9.7 01/04/2016   MG 2.1 01/04/2016   Neuropathy Markers Lab Results  Component Value Date   VITAMINB12 321 01/04/2016   Bone Pathology Markers Lab Results  Component Value Date   ALKPHOS 97 01/04/2016   25OHVITD1 49 01/04/2016   25OHVITD2 25 01/04/2016   25OHVITD3 24 01/04/2016   CALCIUM 9.7 01/04/2016   Coagulation Parameters Lab Results  Component Value Date   PLT 238 11/18/2014   Cardiovascular Markers Lab Results  Component Value Date   HGB 11.1 (L) 11/18/2014   HCT 34.1 (L) 11/18/2014   Note: Lab results reviewed.  No new results. Labs recently completed by PCP.  Recent Diagnostic Imaging Review  Dg Lumbar Spine Complete W/bend  Result Date: 09/12/2016 CLINICAL DATA:  Osteoarthritis. EXAM: LUMBAR SPINE - COMPLETE WITH BENDING VIEWS COMPARISON:  06/28/2016. FINDINGS: Degenerative changes lumbar spine. No evidence of fracture or dislocation. Diffuse osteopenia. Surgical clips noted over the abdomen and pelvis. Prominent amount of stool throughout the colon. Constipation cannot be excluded . IMPRESSION: Diffuse degenerative changes lumbar spine. No acute abnormality identified. Electronically  Signed   By: Marcello Moores  Register   On: 09/12/2016 13:38   Note: Imaging results reviewed.          Meds  The patient has a current medication list which includes the following prescription(s): buspirone, cholecalciferol, gabapentin, magnesium, metronidazole, morphine, naloxone, omeprazole, potassium gluconate, premarin, prenatal vit-fe fum-fa-omega, align, teriparatide (recombinant), triamcinolone cream, valsartan, venlafaxine xr, benefiber, morphine, and morphine.  Current Outpatient Prescriptions on File Prior to Visit  Medication Sig  . busPIRone (BUSPAR) 15 MG tablet TAKE 1/3 TO 1/2 TAB AS NEEDED FOR ANXIETY UP TO TWICE A DAY  . cholecalciferol (VITAMIN D) 1000 units tablet Take 5,000 Units by mouth 3 (three) times a week.   . Magnesium 500 MG TABS Take 1 tablet by mouth 2 (two) times daily.  . metroNIDAZOLE (METROCREAM) 0.75 % cream APPLY ON THE SKIN AS DIRECTED. APPLY TO AFFECTED AREAS DAILY AS NEEDED.  . naloxone (NARCAN) 2 MG/2ML injection Inject content of syringe into thigh muscle. Call  911.  . omeprazole (PRILOSEC) 20 MG capsule Take 20 mg by mouth 2 (two) times daily.  . potassium gluconate 595 MG TABS tablet Take 595 mg by mouth daily as needed (low potassium).  Marland Kitchen PREMARIN vaginal cream INSERT 1GRAM VAGINALLY 3 TIMES WEEKLY AT BEDTIME  . PRENATAL VIT-FE FUM-FA-OMEGA PO Take 1 tablet by mouth daily.  . Probiotic Product (ALIGN) 4 MG CAPS Take 1 capsule by mouth at bedtime. Per Pt.  . Teriparatide, Recombinant, (FORTEO) 600 MCG/2.4ML SOLN Inject 20 mcg into the skin daily.  Marland Kitchen triamcinolone cream (KENALOG) 0.1 % ON THE SKIN TWICE A DAY. APPLY TO AFFECTED AREAS TWICE A DAY AS NEEDED.  Marland Kitchen valsartan (DIOVAN) 40 MG tablet Take 40 mg by mouth every morning.  . venlafaxine XR (EFFEXOR-XR) 75 MG 24 hr capsule Take 225 mg by mouth every morning.  . Wheat Dextrin (BENEFIBER) POWD Stir 2 teaspoons of Benefiber into 4-8 oz of any non-carbonated beverage or soft food (hot or cold) TID.   No  current facility-administered medications on file prior to visit.    ROS  Constitutional: Denies any fever or chills Gastrointestinal: No reported hemesis, hematochezia, vomiting, or acute GI distress Musculoskeletal: Denies any acute onset joint swelling, redness, loss of ROM, or weakness Neurological: No reported episodes of acute onset apraxia, aphasia, dysarthria, agnosia, amnesia, paralysis, loss of coordination, or loss of consciousness  Allergies  Ms. Santy is allergic to iohexol; ace inhibitors; losartan potassium; and neosporin [neomycin-bacitracin zn-polymyx].  [place]  Drug: Ms. Hamblen  reports that she does not use drugs. Alcohol:  reports that she does not drink alcohol. Tobacco:  reports that she has never smoked. She has never used smokeless tobacco. 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 Family: family history includes Alzheimer's disease in her father; Hypertension in her mother.  Past Surgical History:  Procedure Laterality Date  . ABDOMINAL HYSTERECTOMY  1988   partial  . CHOLECYSTECTOMY N/A 05/18/2013   Procedure: LAPAROSCOPIC CHOLECYSTECTOMY WITH INTRAOPERATIVE CHOLANGIOGRAM/laparoscopy/ ;  Surgeon: Pedro Earls, MD;  Location: [facility];  Service: General;  Laterality: N/A;  . EXPLORATORY LAPAROTOMY  2012  . GASTRIC BYPASS  11/13/2009  . HERNIA REPAIR  2011   hiatal  . LAPAROSCOPY  03/04/2012   Procedure: LAPAROSCOPY DIAGNOSTIC;  Surgeon: Pedro Earls, MD;  Location: [facility];  Service: General;  Laterality: N/A;  Resection of Candy  Cane Roux en Y  . TMJ- LEFT--ABOUT 10 YRS AGO--LEFT SIDE--PT NOW HAS SOME PAIN LEFT JAW Left    still has pain, scar tissue has been removed   Constitutional Exam  General appearance: Well nourished, well developed, and well hydrated. In no apparent acute distress Vitals:   12/11/16 0801  BP: 125/69  Pulse: 78  Resp: 18  Temp: 97.6 F (36.4 C)  SpO2: 100%  Weight: 185 lb (83.9 kg)  Height: '5\' 6"'$  (1.676 m)   BMI Assessment: Estimated body mass index is 29.86 kg/m as calculated from the following:   Height as of this encounter: '5\' 6"'$  (1.676 m).   Weight as of this encounter: 185 lb (83.9 kg).  BMI interpretation table: BMI level Category Range association with higher incidence of chronic pain  <  18 kg/m2 Underweight   18.5-24.9 kg/m2 Ideal body weight   25-29.9 kg/m2 Overweight Increased incidence by 20%  30-34.9 kg/m2 Obese (Class I) Increased incidence by 68%  35-39.9 kg/m2 Severe obesity (Class II) Increased incidence by 136%  >40 kg/m2 Extreme obesity (Class III) Increased incidence by 254%   BMI Readings from Last 4 Encounters:  12/11/16 29.86 kg/m  09/12/16 27.44 kg/m  09/02/16 27.44 kg/m  08/19/16 28.50 kg/m   Wt Readings from Last 4 Encounters:  12/11/16 185 lb (83.9 kg)  09/12/16 170 lb (77.1 kg)  09/02/16 170 lb (77.1 kg)  08/19/16 176 lb 9.6 oz (80.1 kg)  Psych/Mental status: Alert, oriented x 3 (person, place, & time)       increased jerking motion observed (evaluated by neurology, unknown etiology) Eyes: PERLA Respiratory: No evidence of acute respiratory distress  Cervical Spine Exam  Inspection: No masses, redness, or swelling Alignment: Symmetrical Functional ROM: Unrestricted ROM      Stability: No instability detected Muscle strength & Tone: Functionally intact Sensory: Unimpaired Palpation: No palpable anomalies              Upper Extremity (UE) Exam    Side: Right upper extremity  Side: Left upper extremity  Inspection: No masses, redness,  swelling, or asymmetry. No contractures  Inspection: No masses, redness, swelling, or asymmetry. No contractures  Functional ROM: Unrestricted ROM          Functional ROM: Unrestricted ROM          Muscle strength & Tone: Functionally intact  Muscle strength & Tone: Functionally intact  Sensory: Unimpaired  Sensory: Unimpaired  Palpation: No palpable anomalies              Palpation: No palpable anomalies              Specialized Test(s): Deferred         Specialized Test(s): Deferred          Thoracic Spine Exam  Inspection: No masses, redness, or swelling Alignment: Symmetrical Functional ROM: Unrestricted ROM Stability: No instability detected Sensory: Unimpaired Muscle strength & Tone: Complains of area being tender to palpation, hypersensitive.  Lumbar Spine Exam  Inspection: No masses, redness, or swelling Alignment: Symmetrical Functional ROM: Unrestricted ROM      Stability: No instability detected Muscle strength & Tone: Functionally intact Sensory: Unimpaired Palpation: No palpable anomalies       Provocative Tests: Lumbar Hyperextension and rotation test: evaluation deferred today       Patrick's Maneuver: evaluation deferred today                    Gait & Posture Assessment  Ambulation: Unassisted Gait: Relatively normal for age and body habitus Posture: WNL   Lower Extremity Exam    Side: Right lower extremity  Side: Left lower extremity  Inspection: No masses, redness, swelling, or asymmetry. No contractures  Inspection: No masses, redness, swelling, or asymmetry. No contractures  Functional ROM: Unrestricted ROM          Functional ROM: Unrestricted ROM          Muscle strength & Tone: Functionally intact  Muscle strength & Tone: Functionally intact  Sensory: Unimpaired  Sensory: Unimpaired  Palpation: No palpable anomalies  Palpation: No palpable anomalies   Assessment  Primary Diagnosis & Pertinent Problem List: The primary encounter diagnosis was Chronic  thoracic radicular pain (Left-sided). Diagnoses of History of rib fracture (Left 4th and 8th rib), Neurogenic pain, Osteoarthritis of  multiple joints, unspecified osteoarthritis type, Chronic pain syndrome, and Long term current use of opiate analgesic were also pertinent to this visit.  Status Diagnosis  Controlled Controlled Controlled 1. Chronic thoracic radicular pain (Left-sided)   2. History of rib fracture (Left 4th and 8th rib)   3. Neurogenic pain   4. Osteoarthritis of multiple joints, unspecified osteoarthritis type   5. Chronic pain syndrome   6. Long term current use of opiate analgesic      Plan of Care  Pharmacotherapy (Medications Ordered): Meds ordered this encounter  Medications  . morphine (MS CONTIN) 30 MG 12 hr tablet    Sig: Take 1 tablet (30 mg total) by mouth every 12 (twelve) hours.    Dispense:  60 tablet    Refill:  0    Do not place this medication, or any other prescription from our practice, on "Automatic Refill". Patient may have prescription filled one day early if pharmacy is closed on scheduled refill date. Do not fill until: 12/31/16 To last until: 01/30/17    Order Specific Question:   Supervising Provider    Answer:   [name] [phone]  . morphine (MS CONTIN) 30 MG 12 hr tablet    Sig: Take 1 tablet (30 mg total) by mouth every 12 (twelve) hours.    Dispense:  60 tablet    Refill:  0    Do not place this medication, or any other prescription from our practice, on "Automatic Refill". Patient may have prescription filled one day early if pharmacy is closed on scheduled refill date. Do not fill until: 01/30/17 To last until: 03/01/17    Order Specific Question:   Supervising Provider    Answer:   [name] [phone]  . morphine (MS CONTIN) 30 MG 12 hr tablet    Sig: Take 1 tablet (30 mg total) by mouth every 12 (twelve) hours.    Dispense:  60 tablet    Refill:  0    Do not place this medication, or any other prescription from  our practice, on "Automatic Refill". Patient may have prescription filled one day early if pharmacy is closed on scheduled refill date. Do not fill until: 03/01/17 To last until: 03/31/17    Order Specific Question:   Supervising Provider    Answer:   [name] [phone]  . gabapentin (NEURONTIN) 300 MG capsule    Sig: Take 3 capsules (900 mg total) by mouth 2 (two) times daily. May go up to 3 capsules 4 times a day. Titrate up slowly.    Dispense:  180 capsule    Refill:  2    Do not place this medication, or any other prescription from our practice, on "Automatic Refill". Patient may have prescription filled one day early if pharmacy is closed on scheduled refill date.    Order Specific Question:   Supervising Provider    Answer:   [name] [[id number]]   New Prescriptions   No medications on file   Medications administered today: Ms. Buford had no medications administered during this visit. Lab-work, procedure(s), and/or referral(s): Orders Placed This Encounter  Procedures  . ToxASSURE Select 13 (MW), Urine   Imaging and/or referral(s): None  Interventional therapies: Planned, scheduled, and/or pending:   Not at this time.   Considering:   Left greater occipital nerve RFA  Diagnostic left C2 + TON nerve block  Possible left C2 + TON    Palliative PRN treatment(s):   Left greater occipital nerve RFA  Provider-requested follow-up: Return in about 3 months (around 03/13/2017) for Medication Mgmt.  Future Appointments Date Time Provider [place]  03/06/2017 10:00 AM [place], NP [facility] None   Primary Care Physician: Cari Caraway, MD Location: [facility] Out[facility] Note by: Vevelyn Francois NP Date: 12/11/2016; Time: 9:54 AM  Pain Score Disclaimer: We use the NRS-11 scale. This is a self-reported, subjective measurement of pain severity with only modest accuracy. It is used primarily to identify changes within a  particular patient. It must be understood that outpatient pain scales are significantly less accurate that those used for research, where they can be applied under ideal controlled circumstances with minimal exposure to variables. In reality, the score is likely to be a combination of pain intensity and pain affect, where pain affect describes the degree of emotional arousal or changes in action readiness caused by the sensory experience of pain. Factors such as social and work situation, setting, emotional state, anxiety levels, expectation, and prior pain experience may influence pain perception and show large inter-individual differences that may also be affected by time variables.  Patient instructions provided during this appointment: There are no Patient Instructions on file for this visit.

## 2016-12-11 NOTE — Patient Instructions (Signed)
Educated pt on viscosupplementation. Informed pt and spouse that this can be performed at this office if needed. Verbalized understanding and will follow up sooner if needed.

## 2016-12-17 LAB — TOXASSURE SELECT 13 (MW), URINE

## 2017-01-02 ENCOUNTER — Ambulatory Visit (INDEPENDENT_AMBULATORY_CARE_PROVIDER_SITE_OTHER): Payer: 59 | Admitting: Psychology

## 2017-01-02 DIAGNOSIS — F411 Generalized anxiety disorder: Secondary | ICD-10-CM

## 2017-01-20 ENCOUNTER — Ambulatory Visit (INDEPENDENT_AMBULATORY_CARE_PROVIDER_SITE_OTHER): Payer: 59 | Admitting: Psychology

## 2017-01-20 DIAGNOSIS — F411 Generalized anxiety disorder: Secondary | ICD-10-CM

## 2017-01-21 ENCOUNTER — Ambulatory Visit: Payer: Medicare Other | Attending: Pain Medicine | Admitting: Pain Medicine

## 2017-01-21 ENCOUNTER — Encounter: Payer: Self-pay | Admitting: Pain Medicine

## 2017-01-21 VITALS — BP 122/68 | HR 68 | Temp 97.9°F | Resp 16 | Ht 66.0 in | Wt 190.0 lb

## 2017-01-21 DIAGNOSIS — M47812 Spondylosis without myelopathy or radiculopathy, cervical region: Secondary | ICD-10-CM | POA: Insufficient documentation

## 2017-01-21 DIAGNOSIS — G894 Chronic pain syndrome: Secondary | ICD-10-CM

## 2017-01-21 DIAGNOSIS — F329 Major depressive disorder, single episode, unspecified: Secondary | ICD-10-CM | POA: Diagnosis not present

## 2017-01-21 DIAGNOSIS — R1012 Left upper quadrant pain: Secondary | ICD-10-CM | POA: Insufficient documentation

## 2017-01-21 DIAGNOSIS — G8929 Other chronic pain: Secondary | ICD-10-CM

## 2017-01-21 DIAGNOSIS — K219 Gastro-esophageal reflux disease without esophagitis: Secondary | ICD-10-CM | POA: Diagnosis not present

## 2017-01-21 DIAGNOSIS — Z8781 Personal history of (healed) traumatic fracture: Secondary | ICD-10-CM

## 2017-01-21 DIAGNOSIS — F419 Anxiety disorder, unspecified: Secondary | ICD-10-CM | POA: Diagnosis not present

## 2017-01-21 DIAGNOSIS — K56609 Unspecified intestinal obstruction, unspecified as to partial versus complete obstruction: Secondary | ICD-10-CM | POA: Diagnosis not present

## 2017-01-21 DIAGNOSIS — K589 Irritable bowel syndrome without diarrhea: Secondary | ICD-10-CM | POA: Diagnosis not present

## 2017-01-21 DIAGNOSIS — Z79891 Long term (current) use of opiate analgesic: Secondary | ICD-10-CM

## 2017-01-21 DIAGNOSIS — G629 Polyneuropathy, unspecified: Secondary | ICD-10-CM | POA: Insufficient documentation

## 2017-01-21 DIAGNOSIS — K2970 Gastritis, unspecified, without bleeding: Secondary | ICD-10-CM | POA: Insufficient documentation

## 2017-01-21 DIAGNOSIS — R05 Cough: Secondary | ICD-10-CM | POA: Insufficient documentation

## 2017-01-21 DIAGNOSIS — M62838 Other muscle spasm: Secondary | ICD-10-CM | POA: Insufficient documentation

## 2017-01-21 DIAGNOSIS — R10A2 Flank pain, left side: Secondary | ICD-10-CM

## 2017-01-21 DIAGNOSIS — M5414 Radiculopathy, thoracic region: Secondary | ICD-10-CM | POA: Diagnosis not present

## 2017-01-21 DIAGNOSIS — Z87442 Personal history of urinary calculi: Secondary | ICD-10-CM | POA: Diagnosis not present

## 2017-01-21 DIAGNOSIS — S2232XA Fracture of one rib, left side, initial encounter for closed fracture: Secondary | ICD-10-CM | POA: Insufficient documentation

## 2017-01-21 DIAGNOSIS — M542 Cervicalgia: Secondary | ICD-10-CM | POA: Diagnosis not present

## 2017-01-21 DIAGNOSIS — M47816 Spondylosis without myelopathy or radiculopathy, lumbar region: Secondary | ICD-10-CM | POA: Insufficient documentation

## 2017-01-21 DIAGNOSIS — R109 Unspecified abdominal pain: Secondary | ICD-10-CM

## 2017-01-21 DIAGNOSIS — E669 Obesity, unspecified: Secondary | ICD-10-CM | POA: Insufficient documentation

## 2017-01-21 DIAGNOSIS — K449 Diaphragmatic hernia without obstruction or gangrene: Secondary | ICD-10-CM | POA: Insufficient documentation

## 2017-01-21 DIAGNOSIS — M940 Chondrocostal junction syndrome [Tietze]: Secondary | ICD-10-CM | POA: Insufficient documentation

## 2017-01-21 DIAGNOSIS — I10 Essential (primary) hypertension: Secondary | ICD-10-CM | POA: Insufficient documentation

## 2017-01-21 DIAGNOSIS — Z6830 Body mass index (BMI) 30.0-30.9, adult: Secondary | ICD-10-CM | POA: Insufficient documentation

## 2017-01-21 MED ORDER — PREDNISONE 20 MG PO TABS: ORAL_TABLET | ORAL | 0 refills | Status: AC

## 2017-01-21 NOTE — Progress Notes (Signed)
Patient's Name: Kari Gibbs  MRN: [id number]  Referring Provider: Cari Caraway, MD  DOB: May 04, 1951  PCP: Cari Caraway, MD  DOS: 01/21/2017  Note by: Kathlen Brunswick. Dossie Arbour, MD  Service setting: Ambulatory outpatient  Specialty: Interventional Pain Management  Location: [facility] (AMB) Pain Management Facility    Patient type: Established   Primary Reason(s) for Visit: Evaluation of an undiagnosed new problem with uncertain prognosis (Level of risk: moderate) CC: Abdominal Pain (left abdomen under left breast)  HPI  Kari Gibbs is a 66 y.o. year old, female patient, who comes today for a follow-up evaluation. 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; S/P laparoscopic cholecystectomy Oct 2014; Fracture of rib of left side; Long term current use of opiate analgesic; Long term prescription opiate use; Opiate use (60 MME/day); Encounter for therapeutic drug level monitoring; Opiate dependence ([place]); Encounter for chronic pain management; Chronic pain syndrome; Chronic flank pain (Location of Primary Source of Pain) (Left); Chronic thoracic radicular pain (Left-sided); Opioid-induced constipation (OIC); Chronic occipital neuralgia (Left); Neuropathic pain; Neurogenic pain; History of rib fracture (Left 4th and 8th rib); Vertebral body hemangioma (T11); Metatarsalgia of right foot; Chronic abdominal pain (Bilateral) (L>R) (possible left-sided thoracic radiculopathy); Osteoarthritis; Return to work evaluation (FCE: Medium); Varicose veins of bilateral lower extremities with other complications; Varicose veins of right lower extremity with complications; Chronic neck pain (Bilateral) (L>R); Cervical facet syndrome (Bilateral) (L>R); Muscle spasticity; Tremors of nervous system; Muscle twitching; Cervical spondylosis; Chronic bilateral low back pain without sciatica; Acute costochondritis (Left);  and Morbid obesity ([place]) on her problem list. Ms. Harp was last seen on 09/12/2016. Her primarily concern today is the Abdominal Pain (left abdomen under left breast)  Pain Assessment: Self-Reported Pain Score: 4 /10             Reported level is compatible with observation.       Pain Type: Chronic pain Pain Location: Abdomen Pain Orientation: Left Pain Descriptors / Indicators: Radiating, Penetrating, Stabbing, Throbbing Pain Frequency: Constant  Further details on both, my assessment(s), as well as the proposed treatment plan, please see below. The patient returns today for evaluation of a left sided chest pain in the costochondral region at the costal margin. She is experiencing exquisite tenderness to palpation over the left costochondral margin. This appears to have started as a consequence of a chronic cough that she has developed at nighttime. I have encouraged patient to go and see her primary care physician for a full evaluation on this cough. For the treatment of this symptom, I will go ahead and give her a prescription for a steroid taper. She currently is afebrile with no productive cough or any apparent suggestion of an acute infection. In addition, the patient has indicated having problems with her knees for which she sees Dr. Dorna Gibbs at [facility] in [place]. Apparently Dr. Berenice Gibbs has done multiple knee injections using Supartz 5 with good long-term success.patient describes her knee pain has been bilateral with the right knee being worst on the left. She denies any prior surgeries. In terms of her costochondral pain, I have instructed the patient to complete the steroid taper. In the event that the steroids do not help, she was instructed to call us for a when necessary injection in the costochondral area and/or intercostal nerve blocks under fluoroscopic guidance and IV sedation. Today I took the time to inform the patient about the risks and possible  complications  associated with the use of steroids.  Laboratory Chemistry  Inflammation Markers Lab Results  Component Value Date   CRP <0.5 01/04/2016   ESRSEDRATE 17 01/04/2016   (CRP: Acute Phase) (ESR: Chronic Phase) Renal Function Markers Lab Results  Component Value Date   BUN 20 01/04/2016   CREATININE 0.76 01/04/2016   GFRAA >60 01/04/2016   GFRNONAA >60 01/04/2016   Hepatic Function Markers Lab Results  Component Value Date   AST 33 01/04/2016   ALT 26 01/04/2016   ALBUMIN 4.2 01/04/2016   ALKPHOS 97 01/04/2016   Electrolytes Lab Results  Component Value Date   NA 139 01/04/2016   K 4.6 01/04/2016   CL 99 (L) 01/04/2016   CALCIUM 9.7 01/04/2016   MG 2.1 01/04/2016   Neuropathy Markers Lab Results  Component Value Date   VITAMINB12 321 01/04/2016   Bone Pathology Markers Lab Results  Component Value Date   ALKPHOS 97 01/04/2016   25OHVITD1 49 01/04/2016   25OHVITD2 25 01/04/2016   25OHVITD3 24 01/04/2016   CALCIUM 9.7 01/04/2016   Coagulation Parameters Lab Results  Component Value Date   PLT 238 11/18/2014   Cardiovascular Markers Lab Results  Component Value Date   HGB 11.1 (L) 11/18/2014   HCT 34.1 (L) 11/18/2014   Note: Lab results reviewed.  Recent Diagnostic Imaging Review  Dg Lumbar Spine Complete W/bend Result Date: 09/12/2016 CLINICAL DATA:  Osteoarthritis. EXAM: LUMBAR SPINE - COMPLETE WITH BENDING VIEWS COMPARISON:  06/28/2016. FINDINGS: Degenerative changes lumbar spine. No evidence of fracture or dislocation. Diffuse osteopenia. Surgical clips noted over the abdomen and pelvis. Prominent amount of stool throughout the colon. Constipation cannot be excluded . IMPRESSION: Diffuse degenerative changes lumbar spine. No acute abnormality identified. Electronically Signed   By: Maisie Fus  Register   On: 09/12/2016 13:38   Cervical Imaging: Cervical DG complete:  Results for orders placed during the hospital encounter of 03/27/16  DG Cervical Spine  Complete   Narrative CLINICAL DATA:  Popping noise along the left side of the neck with head rotation. No known injury.  EXAM: CERVICAL SPINE - COMPLETE 4+ VIEW  COMPARISON:  None.  FINDINGS: No fracture.  No spondylolisthesis.  Moderate loss disc height at C4-C5 through C6-C7. Small endplate spurs are noted at these levels. There is no significant neural foraminal narrowing.  Bones are demineralized.  Soft tissues are unremarkable.  IMPRESSION: 1. No fracture, spondylolisthesis or acute finding. 2. 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   Electronically Signed   By: Amie Portland M.D.   On: 03/27/2016 12:54    Thoracic Imaging: Thoracic MR wo contrast:  Results for orders placed during the hospital encounter of 08/26/13  MR Thoracic Spine Wo Contrast   Addendum ADDENDUM REPORT: 09/11/2013 11:20  ADDENDUM: The original report was by Dr. Dwyane Dee. The following addendum is by Dr. Gaylyn Rong:  I received a phone call from Dr. Selena Batten at 11:10 a.m. on 09/11/2013 regarding this patient who has left lower chest pain localizing to left lateral/anterolateral rib pain.  Reviewing the images in comparison to the exam from 01/26/2013 in light of this specific localization, there is a subtle band of sclerosis in the left anterolateral eighth rib on images 11 through 14 of series 3. This is not apparent on the June exam and in light of the patient's point tenderness at this location, is most compatible with a healing nondisplaced fracture. I have surveyed the rest of the skeleton on that exam and  do not observe other fractures or significant abnormalities in this vicinity.   Electronically Signed   By: Herbie Baltimore M.D.   On: 09/11/2013 11:20       Herbie Baltimore, MD 09/11/2013 11:22 AM         Narrative CLINICAL DATA:  Rib pain.  Left flank pain.  EXAM: MRI THORACIC SPINE WITHOUT CONTRAST  TECHNIQUE: Multiplanar, multisequence MR imaging was performed. No  intravenous contrast was administered.  COMPARISON:  None.  FINDINGS: Negative for thoracic fracture or mass lesion. Hemangioma T11 vertebral body. Spinal cord signal is normal. No syrinx or mass in the cord.  Negative for spinal stenosis. No cord compression. Disc spaces are maintained and there is no significant degenerative change. Negative for disc protrusion.  IMPRESSION: Negative  Electronically Signed: By: Marlan Palau M.D. On: 08/26/2013 20:14    Lumbosacral Imaging: Lumbar DG Bending views:  Results for orders placed during the hospital encounter of 09/12/16  DG Lumbar Spine Complete W/Bend   Narrative CLINICAL DATA:  Osteoarthritis.  EXAM: LUMBAR SPINE - COMPLETE WITH BENDING VIEWS  COMPARISON:  06/28/2016.  FINDINGS: Degenerative changes lumbar spine. No evidence of fracture or dislocation. Diffuse osteopenia. Surgical clips noted over the abdomen and pelvis. Prominent amount of stool throughout the colon. Constipation cannot be excluded .  IMPRESSION: Diffuse degenerative changes lumbar spine. No acute abnormality identified.   Electronically Signed   By: Maisie Fus  Register   On: 09/12/2016 13:38    Note: Imaging results reviewed.          Meds  The patient has a current medication list which includes the following prescription(s): albuterol, cholecalciferol, conjugated estrogens, gabapentin, losartan, magnesium, metronidazole, morphine, morphine, morphine, naloxone, omeprazole, potassium gluconate, premarin, prenatal vit-fe fum-fa-omega, align, teriparatide (recombinant), valsartan, venlafaxine xr, benefiber, and prednisone.  Current Outpatient Prescriptions on File Prior to Visit  Medication Sig  . cholecalciferol (VITAMIN D) 1000 units tablet Take 5,000 Units by mouth 3 (three) times a week.   . gabapentin (NEURONTIN) 300 MG capsule Take 3 capsules (900 mg total) by mouth 2 (two) times daily. May go up to 3 capsules 4 times a day. Titrate up  slowly.  . Magnesium 500 MG TABS Take 1 tablet by mouth 2 (two) times daily.  . metroNIDAZOLE (METROCREAM) 0.75 % cream APPLY ON THE SKIN AS DIRECTED. APPLY TO AFFECTED AREAS DAILY AS NEEDED.  Marland Kitchen morphine (MS CONTIN) 30 MG 12 hr tablet Take 1 tablet (30 mg total) by mouth every 12 (twelve) hours.  Melene Muller ON 01/30/2017] morphine (MS CONTIN) 30 MG 12 hr tablet Take 1 tablet (30 mg total) by mouth every 12 (twelve) hours.  Melene Muller ON 03/01/2017] morphine (MS CONTIN) 30 MG 12 hr tablet Take 1 tablet (30 mg total) by mouth every 12 (twelve) hours.  . naloxone (NARCAN) 2 MG/2ML injection Inject content of syringe into thigh muscle. Call 911.  . omeprazole (PRILOSEC) 20 MG capsule Take 20 mg by mouth 2 (two) times daily.  . potassium gluconate 595 MG TABS tablet Take 595 mg by mouth daily as needed (low potassium).  Marland Kitchen PREMARIN vaginal cream INSERT 1GRAM VAGINALLY 3 TIMES WEEKLY AT BEDTIME  . PRENATAL VIT-FE FUM-FA-OMEGA PO Take 1 tablet by mouth daily.  . Probiotic Product (ALIGN) 4 MG CAPS Take 1 capsule by mouth at bedtime. Per Pt.  . valsartan (DIOVAN) 40 MG tablet Take 40 mg by mouth every morning.  . venlafaxine XR (EFFEXOR-XR) 75 MG 24 hr capsule Take 225 mg by mouth  every morning.  . Wheat Dextrin (BENEFIBER) POWD Stir 2 teaspoons of Benefiber into 4-8 oz of any non-carbonated beverage or soft food (hot or cold) TID.   No current facility-administered medications on file prior to visit.    ROS  Constitutional: Denies any fever or chills Gastrointestinal: No reported hemesis, hematochezia, vomiting, or acute GI distress Musculoskeletal: Denies any acute onset joint swelling, redness, loss of ROM, or weakness Neurological: No reported episodes of acute onset apraxia, aphasia, dysarthria, agnosia, amnesia, paralysis, loss of coordination, or loss of consciousness  Allergies  Ms. Spink is allergic to iohexol; ace inhibitors; losartan potassium; and neosporin [neomycin-bacitracin  zn-polymyx].  [place]  Drug: Ms. Fenter  reports that she does not use drugs. Alcohol:  reports that she does not drink alcohol. Tobacco:  reports that she has never smoked. She has never used smokeless tobacco. 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 Family: family history includes Alzheimer's disease in her father; Hypertension in her mother.  Past Surgical History:  Procedure Laterality Date  . ABDOMINAL HYSTERECTOMY  1988   partial  . CHOLECYSTECTOMY N/A 05/18/2013   Procedure: LAPAROSCOPIC CHOLECYSTECTOMY WITH INTRAOPERATIVE CHOLANGIOGRAM/laparoscopy/ ;  Surgeon: Pedro Earls, MD;  Location: [facility];  Service: General;  Laterality: N/A;  . EXPLORATORY LAPAROTOMY  2012  . GASTRIC BYPASS  11/13/2009  . HERNIA REPAIR  2011   hiatal  . 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 TMJ- LEFT--ABOUT 10 YRS AGO--LEFT SIDE--PT NOW HAS SOME PAIN LEFT JAW Left    still has pain, scar tissue has been removed   Constitutional Exam  General appearance: Well nourished, well developed, and well hydrated. In no apparent acute distress Vitals:   01/21/17 0856  BP: 122/68  Pulse: 68  Resp: 16  Temp: 97.9 F (36.6 C)  SpO2: 100%  Weight: 190 lb (86.2 kg)  Height: '5\' 6"'$  (1.676 m)   BMI Assessment: Estimated body mass index is 30.67 kg/m as calculated from the following:   Height as of this encounter: '5\' 6"'$  (1.676 m).    Weight as of this encounter: 190 lb (86.2 kg).  BMI interpretation table: BMI level Category Range association with higher incidence of chronic pain  <18 kg/m2 Underweight   18.5-24.9 kg/m2 Ideal body weight   25-29.9 kg/m2 Overweight Increased incidence by 20%  30-34.9 kg/m2 Obese (Class I) Increased incidence by 68%  35-39.9 kg/m2 Severe obesity (Class II) Increased incidence by 136%  >40 kg/m2 Extreme obesity (Class III) Increased incidence by 254%   BMI Readings from Last 4 Encounters:  01/21/17 30.67 kg/m  12/11/16 29.86 kg/m  09/12/16 27.44 kg/m  09/02/16 27.44 kg/m   Wt Readings from Last 4 Encounters:  01/21/17 190 lb (86.2 kg)  12/11/16 185 lb (83.9 kg)  09/12/16 170 lb (77.1 kg)  09/02/16 170 lb (77.1 kg)  Psych/Mental status: Alert, oriented x 3 (person, place, & time)       Eyes: PERLA Respiratory: No evidence of acute respiratory distress  Cervical Spine Exam  Inspection: No masses, redness,  or swelling Alignment: Symmetrical Functional ROM: Unrestricted ROM      Stability: No instability detected Muscle strength & Tone: Functionally intact Sensory: Unimpaired Palpation: No palpable anomalies              Upper Extremity (UE) Exam    Side: Right upper extremity  Side: Left upper extremity  Inspection: No masses, redness, swelling, or asymmetry. No contractures  Inspection: No masses, redness, swelling, or asymmetry. No contractures  Functional ROM: Unrestricted ROM          Functional ROM: Unrestricted ROM          Muscle strength & Tone: Functionally intact  Muscle strength & Tone: Functionally intact  Sensory: Unimpaired  Sensory: Unimpaired  Palpation: No palpable anomalies              Palpation: No palpable anomalies              Specialized Test(s): Deferred         Specialized Test(s): Deferred          Thoracic Spine Exam  Inspection: No masses, redness, or swelling Alignment: Symmetrical Functional ROM: Unrestricted ROM Stability: No  instability detected Sensory: Exquisite tenderness to palpation over the left lower costochondral margin. No unstable ribs detected. No paradoxical movement of the rib cage. Muscle strength & Tone: Very tender to palpation.  Lumbar Spine Exam  Inspection: No masses, redness, or swelling Alignment: Symmetrical Functional ROM: Unrestricted ROM      Stability: No instability detected Muscle strength & Tone: Functionally intact Sensory: Unimpaired Palpation: No palpable anomalies       Provocative Tests: Lumbar Hyperextension and rotation test: evaluation deferred today       Patrick's Maneuver: evaluation deferred today                    Gait & Posture Assessment  Ambulation: Unassisted Gait: Relatively normal for age and body habitus Posture: WNL   Lower Extremity Exam    Side: Right lower extremity  Side: Left lower extremity  Inspection: No masses, redness, swelling, or asymmetry. No contractures  Inspection: No masses, redness, swelling, or asymmetry. No contractures  Functional ROM: Unrestricted ROM          Functional ROM: Unrestricted ROM          Muscle strength & Tone: Functionally intact  Muscle strength & Tone: Functionally intact  Sensory: Unimpaired  Sensory: Unimpaired  Palpation: No palpable anomalies  Palpation: No palpable anomalies   Assessment  Primary Diagnosis & Pertinent Problem List: The primary encounter diagnosis was Acute costochondritis (Left). Diagnoses of Chronic flank pain (Location of Primary Source of Pain) (Left), Chronic thoracic radicular pain (Left-sided), History of rib fracture (Left 4th and 8th rib), Chronic pain syndrome, and Long term prescription opiate use were also pertinent to this visit.  Status Diagnosis  Having a Flare-up Recurring Controlled 1. Acute costochondritis (Left)   2. Chronic flank pain (Location of Primary Source of Pain) (Left)   3. Chronic thoracic radicular pain (Left-sided)   4. History of rib fracture (Left 4th and  8th rib)   5. Chronic pain syndrome   6. Long term prescription opiate use     Problems updated and reviewed during this visit: Problem  Acute costochondritis (Left)  Morbid Obesity ([facility])   Plan of Care  Pharmacotherapy (Medications Ordered): Meds ordered this encounter  Medications  . predniSONE (DELTASONE) 20 MG tablet    Sig: Take 3 tab(s) in the morning x  3 days, then 2 tab(s) x 3 days, followed by 1 tab x 3 days.    Dispense:  21 tablet    Refill:  0    Do not add to the "Automatic Refill" notification system.   New Prescriptions   PREDNISONE (DELTASONE) 20 MG TABLET    Take 3 tab(s) in the morning x 3 days, then 2 tab(s) x 3 days, followed by 1 tab x 3 days.   Medications administered today: Ms. Prevette had no medications administered during this visit. Lab-work, procedure(s), and/or referral(s): Orders Placed This Encounter  Procedures  . INTERCOSTAL NERVE BLOCK  . Injection tendon or ligament   Imaging and/or referral(s): None  Interventional therapies: Planned, scheduled, and/or pending:   Possible Left costochondral injections + left intercostal nerve blocks under fluoroscopic guidance and IV sedation, if pain persists after oral steroid taper.   Considering:   Diagnostic left costochondral joint injection Palliative left intercostal nerve blocks (T5 to T8) Possible left intercostal nerve RFA Palliative left greater occipital nerve block Therapeutic Left greater occipital nerve RFA  Diagnostic left C2 + TON nerve block  Possible left C2 + TON RFA    Palliative PRN treatment(s):   Diagnostic left costochondral joint injection Palliative left intercostal nerve blocks (T5 to T8) Possible left intercostal nerve RFA Palliative left greater occipital nerve block Therapeutic Left greater occipital nerve RFA  Diagnostic left C2 + TON nerve block  Possible left C2 + TON RFA    Provider-requested follow-up: Return for PRN procedure(s), by MD, in addition,  Med-Mgmt, w/ NP.  Future Appointments Date Time Provider [place]  03/06/2017 10:00 AM Vevelyn Francois, NP [facility] None   Primary Care Physician: Cari Caraway, MD Location: [facility] Note by: Kathlen Brunswick. Dossie Arbour, M.D, DABA, DABAPM, DABPM, DABIPP, FIPP Date: 01/21/2017; Time: 6:13 PM  Patient instructions provided during this appointment: Patient Instructions   ____________________________________________________________________________________________  Preparing for Procedure with Sedation Instructions: . Oral Intake: Do not eat or drink anything for at least 8 hours prior to your procedure. . Transportation: Public transportation is not allowed. Bring an adult driver. The driver must be physically present in our waiting room before any procedure can be started. Marland Kitchen Physical Assistance: Bring an adult physically capable of assisting you, in the event you need help. This adult should keep you company at home for at least 6 hours after the procedure. . Blood Pressure Medicine: Take your blood pressure medicine with a sip of water the morning of the procedure. . Blood thinners:  . Diabetics on insulin: Notify the staff so that you can be scheduled 1st case in the morning. If your diabetes requires high dose insulin, take only  of your normal insulin dose the morning of the procedure and notify the staff that you have done so. . Preventing infections: Shower with an antibacterial soap the morning of your procedure. . Build-up your immune system: Take 1000 mg of Vitamin C with every meal (3 times a day) the day prior to your procedure. Marland Kitchen Antibiotics: Inform the staff if you have a condition or reason that requires you to take antibiotics before dental procedures. . Pregnancy: If you are pregnant, call and cancel the procedure. . Sickness: If you have a cold, fever, or any active infections, call and cancel the procedure. . Arrival: You must be in the  facility at least 30 minutes prior to your scheduled procedure. . Children: Do not bring children with you. Marland Kitchen Dress appropriately: Bring dark  clothing that you would not mind if they get stained. . Valuables: Do not bring any jewelry or valuables. Procedure appointments are reserved for interventional treatments only. Marland Kitchen No Prescription Refills. . No medication changes will be discussed during procedure appointments. . No disability issues will be discussed. ____________________________________________________________________________________________  GENERAL RISKS AND COMPLICATIONS  What are the risk, side effects and possible complications? Generally speaking, most procedures are safe.  However, with any procedure there are risks, side effects, and the possibility of complications.  The risks and complications are dependent upon the sites that are lesioned, or the type of nerve block to be performed.  The closer the procedure is to the spine, the more serious the risks are.  Great care is taken when placing the radio frequency needles, block needles or lesioning probes, but sometimes complications can occur. 1. Infection: Any time there is an injection through the skin, there is a risk of infection.  This is why sterile conditions are used for these blocks.  There are four possible types of infection. 1. Localized skin infection. 2. Central Nervous System Infection-This can be in the form of Meningitis, which can be deadly. 3. Epidural Infections-This can be in the form of an epidural abscess, which can cause pressure inside of the spine, causing compression of the spinal cord with subsequent paralysis. This would require an emergency surgery to decompress, and there are no guarantees that the patient would recover from the paralysis. 4. Discitis-This is an infection of the intervertebral discs.  It occurs in about 1% of discography procedures.  It is difficult to treat and it may lead to  surgery.        2. Pain: the needles have to go through skin and soft tissues, will cause soreness.       3. Damage to internal structures:  The nerves to be lesioned may be near blood vessels or    other nerves which can be potentially damaged.       4. Bleeding: Bleeding is more common if the patient is taking blood thinners such as  aspirin, Coumadin, Ticiid, Plavix, etc., or if he/she have some genetic predisposition  such as hemophilia. Bleeding into the spinal canal can cause compression of the spinal  cord with subsequent paralysis.  This would require an emergency surgery to  decompress and there are no guarantees that the patient would recover from the  paralysis.       5. Pneumothorax:  Puncturing of a lung is a possibility, every time a needle is introduced in  the area of the chest or upper back.  Pneumothorax refers to free air around the  collapsed lung(s), inside of the thoracic cavity (chest cavity).  Another two possible  complications related to a similar event would include: Hemothorax and Chylothorax.   These are variations of the Pneumothorax, where instead of air around the collapsed  lung(s), you may have blood or chyle, respectively.       6. Spinal headaches: They may occur with any procedures in the area of the spine.       7. Persistent CSF (Cerebro-Spinal Fluid) leakage: This is a rare problem, but may occur  with prolonged intrathecal or epidural catheters either due to the formation of a fistulous  track or a dural tear.       8. Nerve damage: By working so close to the spinal cord, there is always a possibility of  nerve damage, which could be as serious as a permanent spinal  cord injury with  paralysis.       9. Death:  Although rare, severe deadly allergic reactions known as "Anaphylactic  reaction" can occur to any of the medications used.      10. Worsening of the symptoms:  We can always make thing worse.  What are the chances of something like this  happening? Chances of any of this occuring are extremely low.  By statistics, you have more of a chance of getting killed in a motor vehicle accident: while driving to the hospital than any of the above occurring .  Nevertheless, you should be aware that they are possibilities.  In general, it is similar to taking a shower.  Everybody knows that you can slip, hit your head and get killed.  Does that mean that you should not shower again?  Nevertheless always keep in mind that statistics do not mean anything if you happen to be on the wrong side of them.  Even if a procedure has a 1 (one) in a 1,000,000 (million) chance of going wrong, it you happen to be that one..Also, keep in mind that by statistics, you have more of a chance of having something go wrong when taking medications.  Who should not have this procedure? If you are on a blood thinning medication (e.g. Coumadin, Plavix, see list of "Blood Thinners"), or if you have an active infection going on, you should not have the procedure.  If you are taking any blood thinners, please inform your physician.  How should I prepare for this procedure?  Do not eat or drink anything at least six hours prior to the procedure.  Bring a driver with you .  It cannot be a taxi.  Come accompanied by an adult that can drive you back, and that is strong enough to help you if your legs get weak or numb from the local anesthetic.  Take all of your medicines the morning of the procedure with just enough water to swallow them.  If you have diabetes, make sure that you are scheduled to have your procedure done first thing in the morning, whenever possible.  If you have diabetes, take only half of your insulin dose and notify our nurse that you have done so as soon as you arrive at the clinic.  If you are diabetic, but only take blood sugar pills (oral hypoglycemic), then do not take them on the morning of your procedure.  You may take them after you have had the  procedure.  Do not take aspirin or any aspirin-containing medications, at least eleven (11) days prior to the procedure.  They may prolong bleeding.  Wear loose fitting clothing that may be easy to take off and that you would not mind if it got stained with Betadine or blood.  Do not wear any jewelry or perfume  Remove any nail coloring.  It will interfere with some of our monitoring equipment.  NOTE: Remember that this is not meant to be interpreted as a complete list of all possible complications.  Unforeseen problems may occur.  BLOOD THINNERS The following drugs contain aspirin or other products, which can cause increased bleeding during surgery and should not be taken for 2 weeks prior to and 1 week after surgery.  If you should need take something for relief of minor pain, you may take acetaminophen which is found in Tylenol,m Datril, Anacin-3 and Panadol. It is not blood thinner. The products listed below are.  Do not take any  of the products listed below in addition to any listed on your instruction sheet.  A.P.C or A.P.C with Codeine Codeine Phosphate Capsules #3 Ibuprofen Ridaura  ABC compound Congesprin Imuran rimadil  Advil Cope Indocin Robaxisal  Alka-Seltzer Effervescent Pain Reliever and Antacid Coricidin or Coricidin-D  Indomethacin Rufen  Alka-Seltzer plus Cold Medicine Cosprin Ketoprofen S-A-C Tablets  Anacin Analgesic Tablets or Capsules Coumadin Korlgesic Salflex  Anacin Extra Strength Analgesic tablets or capsules CP-2 Tablets Lanoril Salicylate  Anaprox Cuprimine Capsules Levenox Salocol  Anexsia-D Dalteparin Magan Salsalate  Anodynos Darvon compound Magnesium Salicylate Sine-off  Ansaid Dasin Capsules Magsal Sodium Salicylate  Anturane Depen Capsules Marnal Soma  APF Arthritis pain formula Dewitt's Pills Measurin Stanback  Argesic Dia-Gesic Meclofenamic Sulfinpyrazone  Arthritis Bayer Timed Release Aspirin Diclofenac Meclomen Sulindac  Arthritis pain formula  Anacin Dicumarol Medipren Supac  Analgesic (Safety coated) Arthralgen Diffunasal Mefanamic Suprofen  Arthritis Strength Bufferin Dihydrocodeine Mepro Compound Suprol  Arthropan liquid Dopirydamole Methcarbomol with Aspirin Synalgos  ASA tablets/Enseals Disalcid Micrainin Tagament  Ascriptin Doan's Midol Talwin  Ascriptin A/D Dolene Mobidin Tanderil  Ascriptin Extra Strength Dolobid Moblgesic Ticlid  Ascriptin with Codeine Doloprin or Doloprin with Codeine Momentum Tolectin  Asperbuf Duoprin Mono-gesic Trendar  Aspergum Duradyne Motrin or Motrin IB Triminicin  Aspirin plain, buffered or enteric coated Durasal Myochrisine Trigesic  Aspirin Suppositories Easprin Nalfon Trillsate  Aspirin with Codeine Ecotrin Regular or Extra Strength Naprosyn Uracel  Atromid-S Efficin Naproxen Ursinus  Auranofin Capsules Elmiron Neocylate Vanquish  Axotal Emagrin Norgesic Verin  Azathioprine Empirin or Empirin with Codeine Normiflo Vitamin E  Azolid Emprazil Nuprin Voltaren  Bayer Aspirin plain, buffered or children's or timed BC Tablets or powders Encaprin Orgaran Warfarin Sodium  Buff-a-Comp Enoxaparin Orudis Zorpin  Buff-a-Comp with Codeine Equegesic Os-Cal-Gesic   Buffaprin Excedrin plain, buffered or Extra Strength Oxalid   Bufferin Arthritis Strength Feldene Oxphenbutazone   Bufferin plain or Extra Strength Feldene Capsules Oxycodone with Aspirin   Bufferin with Codeine Fenoprofen Fenoprofen Pabalate or Pabalate-SF   Buffets II Flogesic Panagesic   Buffinol plain or Extra Strength Florinal or Florinal with Codeine Panwarfarin   Buf-Tabs Flurbiprofen Penicillamine   Butalbital Compound Four-way cold tablets Penicillin   Butazolidin Fragmin Pepto-Bismol   Carbenicillin Geminisyn Percodan   Carna Arthritis Reliever Geopen Persantine   Carprofen Gold's salt Persistin   Chloramphenicol Goody's Phenylbutazone   Chloromycetin Haltrain Piroxlcam   Clmetidine heparin Plaquenil   Cllnoril Hyco-pap  Ponstel   Clofibrate Hydroxy chloroquine Propoxyphen         Before stopping any of these medications, be sure to consult the physician who ordered them.  Some, such as Coumadin (Warfarin) are ordered to prevent or treat serious conditions such as "deep thrombosis", "pumonary embolisms", and other heart problems.  The amount of time that you may need off of the medication may also vary with the medication and the reason for which you were taking it.  If you are taking any of these medications, please make sure you notify your pain physician before you undergo any procedures.

## 2017-01-21 NOTE — Patient Instructions (Addendum)
____________________________________________________________________________________________  Preparing for Procedure with Sedation Instructions: . Oral Intake: Do not eat or drink anything for at least 8 hours prior to your procedure. . Transportation: Public transportation is not allowed. Bring an adult driver. The driver must be physically present in our waiting room before any procedure can be started. . Physical Assistance: Bring an adult physically capable of assisting you, in the event you need help. This adult should keep you company at home for at least 6 hours after the procedure. . Blood Pressure Medicine: Take your blood pressure medicine with a sip of water the morning of the procedure. . Blood thinners:  . Diabetics on insulin: Notify the staff so that you can be scheduled 1st case in the morning. If your diabetes requires high dose insulin, take only  of your normal insulin dose the morning of the procedure and notify the staff that you have done so. . Preventing infections: Shower with an antibacterial soap the morning of your procedure. . Build-up your immune system: Take 1000 mg of Vitamin C with every meal (3 times a day) the day prior to your procedure. . Antibiotics: Inform the staff if you have a condition or reason that requires you to take antibiotics before dental procedures. . Pregnancy: If you are pregnant, call and cancel the procedure. . Sickness: If you have a cold, fever, or any active infections, call and cancel the procedure. . Arrival: You must be in the facility at least 30 minutes prior to your scheduled procedure. . Children: Do not bring children with you. . Dress appropriately: Bring dark clothing that you would not mind if they get stained. . Valuables: Do not bring any jewelry or valuables. Procedure appointments are reserved for interventional treatments only. . No Prescription Refills. . No medication changes will be discussed during procedure  appointments. . No disability issues will be discussed. ____________________________________________________________________________________________   GENERAL RISKS AND COMPLICATIONS  What are the risk, side effects and possible complications? Generally speaking, most procedures are safe.  However, with any procedure there are risks, side effects, and the possibility of complications.  The risks and complications are dependent upon the sites that are lesioned, or the type of nerve block to be performed.  The closer the procedure is to the spine, the more serious the risks are.  Great care is taken when placing the radio frequency needles, block needles or lesioning probes, but sometimes complications can occur. 1. Infection: Any time there is an injection through the skin, there is a risk of infection.  This is why sterile conditions are used for these blocks.  There are four possible types of infection. 1. Localized skin infection. 2. Central Nervous System Infection-This can be in the form of Meningitis, which can be deadly. 3. Epidural Infections-This can be in the form of an epidural abscess, which can cause pressure inside of the spine, causing compression of the spinal cord with subsequent paralysis. This would require an emergency surgery to decompress, and there are no guarantees that the patient would recover from the paralysis. 4. Discitis-This is an infection of the intervertebral discs.  It occurs in about 1% of discography procedures.  It is difficult to treat and it may lead to surgery.        2. Pain: the needles have to go through skin and soft tissues, will cause soreness.       3. Damage to internal structures:  The nerves to be lesioned may be near blood vessels or      other nerves which can be potentially damaged.       4. Bleeding: Bleeding is more common if the patient is taking blood thinners such as  aspirin, Coumadin, Ticiid, Plavix, etc., or if he/she have some genetic  predisposition  such as hemophilia. Bleeding into the spinal canal can cause compression of the spinal  cord with subsequent paralysis.  This would require an emergency surgery to  decompress and there are no guarantees that the patient would recover from the  paralysis.       5. Pneumothorax:  Puncturing of a lung is a possibility, every time a needle is introduced in  the area of the chest or upper back.  Pneumothorax refers to free air around the  collapsed lung(s), inside of the thoracic cavity (chest cavity).  Another two possible  complications related to a similar event would include: Hemothorax and Chylothorax.   These are variations of the Pneumothorax, where instead of air around the collapsed  lung(s), you may have blood or chyle, respectively.       6. Spinal headaches: They may occur with any procedures in the area of the spine.       7. Persistent CSF (Cerebro-Spinal Fluid) leakage: This is a rare problem, but may occur  with prolonged intrathecal or epidural catheters either due to the formation of a fistulous  track or a dural tear.       8. Nerve damage: By working so close to the spinal cord, there is always a possibility of  nerve damage, which could be as serious as a permanent spinal cord injury with  paralysis.       9. Death:  Although rare, severe deadly allergic reactions known as "Anaphylactic  reaction" can occur to any of the medications used.      10. Worsening of the symptoms:  We can always make thing worse.  What are the chances of something like this happening? Chances of any of this occuring are extremely low.  By statistics, you have more of a chance of getting killed in a motor vehicle accident: while driving to the hospital than any of the above occurring .  Nevertheless, you should be aware that they are possibilities.  In general, it is similar to taking a shower.  Everybody knows that you can slip, hit your head and get killed.  Does that mean that you should not  shower again?  Nevertheless always keep in mind that statistics do not mean anything if you happen to be on the wrong side of them.  Even if a procedure has a 1 (one) in a 1,000,000 (million) chance of going wrong, it you happen to be that one..Also, keep in mind that by statistics, you have more of a chance of having something go wrong when taking medications.  Who should not have this procedure? If you are on a blood thinning medication (e.g. Coumadin, Plavix, see list of "Blood Thinners"), or if you have an active infection going on, you should not have the procedure.  If you are taking any blood thinners, please inform your physician.  How should I prepare for this procedure?  Do not eat or drink anything at least six hours prior to the procedure.  Bring a driver with you .  It cannot be a taxi.  Come accompanied by an adult that can drive you back, and that is strong enough to help you if your legs get weak or numb from the local anesthetic.  Take   all of your medicines the morning of the procedure with just enough water to swallow them.  If you have diabetes, make sure that you are scheduled to have your procedure done first thing in the morning, whenever possible.  If you have diabetes, take only half of your insulin dose and notify our nurse that you have done so as soon as you arrive at the clinic.  If you are diabetic, but only take blood sugar pills (oral hypoglycemic), then do not take them on the morning of your procedure.  You may take them after you have had the procedure.  Do not take aspirin or any aspirin-containing medications, at least eleven (11) days prior to the procedure.  They may prolong bleeding.  Wear loose fitting clothing that may be easy to take off and that you would not mind if it got stained with Betadine or blood.  Do not wear any jewelry or perfume  Remove any nail coloring.  It will interfere with some of our monitoring equipment.  NOTE: Remember that  this is not meant to be interpreted as a complete list of all possible complications.  Unforeseen problems may occur.  BLOOD THINNERS The following drugs contain aspirin or other products, which can cause increased bleeding during surgery and should not be taken for 2 weeks prior to and 1 week after surgery.  If you should need take something for relief of minor pain, you may take acetaminophen which is found in Tylenol,m Datril, Anacin-3 and Panadol. It is not blood thinner. The products listed below are.  Do not take any of the products listed below in addition to any listed on your instruction sheet.  A.P.C or A.P.C with Codeine Codeine Phosphate Capsules #3 Ibuprofen Ridaura  ABC compound Congesprin Imuran rimadil  Advil Cope Indocin Robaxisal  Alka-Seltzer Effervescent Pain Reliever and Antacid Coricidin or Coricidin-D  Indomethacin Rufen  Alka-Seltzer plus Cold Medicine Cosprin Ketoprofen S-A-C Tablets  Anacin Analgesic Tablets or Capsules Coumadin Korlgesic Salflex  Anacin Extra Strength Analgesic tablets or capsules CP-2 Tablets Lanoril Salicylate  Anaprox Cuprimine Capsules Levenox Salocol  Anexsia-D Dalteparin Magan Salsalate  Anodynos Darvon compound Magnesium Salicylate Sine-off  Ansaid Dasin Capsules Magsal Sodium Salicylate  Anturane Depen Capsules Marnal Soma  APF Arthritis pain formula Dewitt's Pills Measurin Stanback  Argesic Dia-Gesic Meclofenamic Sulfinpyrazone  Arthritis Bayer Timed Release Aspirin Diclofenac Meclomen Sulindac  Arthritis pain formula Anacin Dicumarol Medipren Supac  Analgesic (Safety coated) Arthralgen Diffunasal Mefanamic Suprofen  Arthritis Strength Bufferin Dihydrocodeine Mepro Compound Suprol  Arthropan liquid Dopirydamole Methcarbomol with Aspirin Synalgos  ASA tablets/Enseals Disalcid Micrainin Tagament  Ascriptin Doan's Midol Talwin  Ascriptin A/D Dolene Mobidin Tanderil  Ascriptin Extra Strength Dolobid Moblgesic Ticlid  Ascriptin with Codeine  Doloprin or Doloprin with Codeine Momentum Tolectin  Asperbuf Duoprin Mono-gesic Trendar  Aspergum Duradyne Motrin or Motrin IB Triminicin  Aspirin plain, buffered or enteric coated Durasal Myochrisine Trigesic  Aspirin Suppositories Easprin Nalfon Trillsate  Aspirin with Codeine Ecotrin Regular or Extra Strength Naprosyn Uracel  Atromid-S Efficin Naproxen Ursinus  Auranofin Capsules Elmiron Neocylate Vanquish  Axotal Emagrin Norgesic Verin  Azathioprine Empirin or Empirin with Codeine Normiflo Vitamin E  Azolid Emprazil Nuprin Voltaren  Bayer Aspirin plain, buffered or children's or timed BC Tablets or powders Encaprin Orgaran Warfarin Sodium  Buff-a-Comp Enoxaparin Orudis Zorpin  Buff-a-Comp with Codeine Equegesic Os-Cal-Gesic   Buffaprin Excedrin plain, buffered or Extra Strength Oxalid   Bufferin Arthritis Strength Feldene Oxphenbutazone   Bufferin plain or Extra Strength Feldene Capsules   Oxycodone with Aspirin   Bufferin with Codeine Fenoprofen Fenoprofen Pabalate or Pabalate-SF   Buffets II Flogesic Panagesic   Buffinol plain or Extra Strength Florinal or Florinal with Codeine Panwarfarin   Buf-Tabs Flurbiprofen Penicillamine   Butalbital Compound Four-way cold tablets Penicillin   Butazolidin Fragmin Pepto-Bismol   Carbenicillin Geminisyn Percodan   Carna Arthritis Reliever Geopen Persantine   Carprofen Gold's salt Persistin   Chloramphenicol Goody's Phenylbutazone   Chloromycetin Haltrain Piroxlcam   Clmetidine heparin Plaquenil   Cllnoril Hyco-pap Ponstel   Clofibrate Hydroxy chloroquine Propoxyphen         Before stopping any of these medications, be sure to consult the physician who ordered them.  Some, such as Coumadin (Warfarin) are ordered to prevent or treat serious conditions such as "deep thrombosis", "pumonary embolisms", and other heart problems.  The amount of time that you may need off of the medication may also vary with the medication and the reason for which  you were taking it.  If you are taking any of these medications, please make sure you notify your pain physician before you undergo any procedures.          

## 2017-02-07 ENCOUNTER — Telehealth: Payer: Self-pay | Admitting: Pain Medicine

## 2017-02-07 NOTE — Telephone Encounter (Signed)
Patient states she is still having a lot of pain and prednisone has not helped, wants to go ahead with another procedure. Please advise and send to [employer] so she can start prior auth

## 2017-02-07 NOTE — Telephone Encounter (Signed)
Attempted to call patient, message left. 

## 2017-02-11 NOTE — Telephone Encounter (Signed)
Having chostochrondral pain.Has prn procedures ordered. Will schedule with secretaries.

## 2017-02-14 NOTE — Telephone Encounter (Signed)
[employer] does this have to be prior auth?

## 2017-02-17 NOTE — Telephone Encounter (Signed)
No it's ok to go ahead and schedule.

## 2017-02-18 ENCOUNTER — Ambulatory Visit (INDEPENDENT_AMBULATORY_CARE_PROVIDER_SITE_OTHER): Payer: 59 | Admitting: Psychology

## 2017-02-18 DIAGNOSIS — F411 Generalized anxiety disorder: Secondary | ICD-10-CM | POA: Diagnosis not present

## 2017-02-18 NOTE — Telephone Encounter (Signed)
NA

## 2017-03-03 ENCOUNTER — Encounter: Payer: Self-pay | Admitting: Pain Medicine

## 2017-03-03 ENCOUNTER — Ambulatory Visit (HOSPITAL_BASED_OUTPATIENT_CLINIC_OR_DEPARTMENT_OTHER): Payer: Medicare Other | Admitting: Pain Medicine

## 2017-03-03 ENCOUNTER — Ambulatory Visit
Admission: RE | Admit: 2017-03-03 | Discharge: 2017-03-03 | Disposition: A | Discharge status: Home / Self Care | Payer: Medicare Other | Source: Ambulatory Visit | Attending: Pain Medicine | Admitting: Pain Medicine

## 2017-03-03 VITALS — BP 116/57 | HR 63 | Temp 96.7°F | Resp 11 | Ht 66.0 in | Wt 190.0 lb

## 2017-03-03 DIAGNOSIS — G894 Chronic pain syndrome: Secondary | ICD-10-CM | POA: Diagnosis not present

## 2017-03-03 DIAGNOSIS — M940 Chondrocostal junction syndrome [Tietze]: Secondary | ICD-10-CM

## 2017-03-03 DIAGNOSIS — R109 Unspecified abdominal pain: Secondary | ICD-10-CM

## 2017-03-03 DIAGNOSIS — M792 Neuralgia and neuritis, unspecified: Secondary | ICD-10-CM

## 2017-03-03 DIAGNOSIS — Z9884 Bariatric surgery status: Secondary | ICD-10-CM | POA: Diagnosis not present

## 2017-03-03 DIAGNOSIS — Z79891 Long term (current) use of opiate analgesic: Secondary | ICD-10-CM | POA: Diagnosis not present

## 2017-03-03 DIAGNOSIS — Z79899 Other long term (current) drug therapy: Secondary | ICD-10-CM | POA: Diagnosis not present

## 2017-03-03 DIAGNOSIS — Z888 Allergy status to other drugs, medicaments and biological substances status: Secondary | ICD-10-CM | POA: Insufficient documentation

## 2017-03-03 DIAGNOSIS — R079 Chest pain, unspecified: Secondary | ICD-10-CM | POA: Diagnosis present

## 2017-03-03 DIAGNOSIS — Z91041 Radiographic dye allergy status: Secondary | ICD-10-CM | POA: Insufficient documentation

## 2017-03-03 DIAGNOSIS — Z8781 Personal history of (healed) traumatic fracture: Secondary | ICD-10-CM | POA: Diagnosis not present

## 2017-03-03 DIAGNOSIS — F1190 Opioid use, unspecified, uncomplicated: Secondary | ICD-10-CM

## 2017-03-03 DIAGNOSIS — G8929 Other chronic pain: Secondary | ICD-10-CM

## 2017-03-03 MED ORDER — MORPHINE SULFATE ER 30 MG PO TBCR: 30.0000 mg | EXTENDED_RELEASE_TABLET | Freq: Two times a day (BID) | ORAL | 0 refills | Status: DC

## 2017-03-03 MED ORDER — TRIAMCINOLONE ACETONIDE 40 MG/ML IJ SUSP
40.0000 mg | Freq: Once | INTRAMUSCULAR | Status: AC
  Administered 2017-03-03: 40 mg
  Filled 2017-03-03: qty 1

## 2017-03-03 MED ORDER — ROPIVACAINE HCL 2 MG/ML IJ SOLN
9.0000 mL | Freq: Once | INTRAMUSCULAR | Status: AC
  Administered 2017-03-03: 10 mL
  Filled 2017-03-03: qty 10

## 2017-03-03 MED ORDER — GABAPENTIN 300 MG PO CAPS: 900.0000 mg | ORAL_CAPSULE | Freq: Two times a day (BID) | ORAL | 2 refills | Status: DC

## 2017-03-03 MED ORDER — LACTATED RINGERS IV SOLN
1000.0000 mL | Freq: Once | INTRAVENOUS | Status: AC
Start: 2017-03-03 — End: 2017-03-03
  Administered 2017-03-03: 1000 mL via INTRAVENOUS

## 2017-03-03 MED ORDER — LIDOCAINE HCL (PF) 1.5 % IJ SOLN
20.0000 mL | Freq: Once | INTRAMUSCULAR | Status: AC
  Administered 2017-03-03: 20 mL
  Filled 2017-03-03: qty 20

## 2017-03-03 MED ORDER — FENTANYL CITRATE (PF) 100 MCG/2ML IJ SOLN
25.0000 ug | INTRAMUSCULAR | Status: DC | PRN
  Administered 2017-03-03: 50 ug via INTRAVENOUS
  Filled 2017-03-03: qty 2

## 2017-03-03 MED ORDER — MIDAZOLAM HCL 5 MG/5ML IJ SOLN
1.0000 mg | INTRAMUSCULAR | Status: DC | PRN
  Administered 2017-03-03: 2 mg via INTRAVENOUS
  Filled 2017-03-03: qty 5

## 2017-03-03 NOTE — Patient Instructions (Addendum)
____________________________________________________________________________________________  Post-Procedure instructions Instructions:  Apply ice: Fill a plastic sandwich bag with crushed ice. Cover it with a small towel and apply to injection site. Apply for 15 minutes then remove x 15 minutes. Repeat sequence on day of procedure, until you go to bed. The purpose is to minimize swelling and discomfort after procedure.  Apply heat: Apply heat to procedure site starting the day following the procedure. The purpose is to treat any soreness and discomfort from the procedure.  Food intake: Start with clear liquids (like water) and advance to regular food, as tolerated.   Physical activities: Keep activities to a minimum for the first 8 hours after the procedure.   Driving: If you have received any sedation, you are not allowed to drive for 24 hours after your procedure.  Blood thinner: Restart your blood thinner 6 hours after your procedure. (Only for those taking blood thinners)  Insulin: As soon as you can eat, you may resume your normal dosing schedule. (Only for those taking insulin)  Infection prevention: Keep procedure site clean and dry.  Post-procedure Pain Diary: Extremely important that this be done correctly and accurately. Recorded information will be used to determine the next step in treatment.  Pain evaluated is that of treated area only. Do not include pain from an untreated area.  Complete every hour, on the hour, for the initial 8 hours. Set an alarm to help you do this part accurately.  Do not go to sleep and have it completed later. It will not be accurate.  Follow-up appointment: Keep your follow-up appointment after the procedure. Usually 2 weeks for most procedures. (6 weeks in the case of radiofrequency.) Bring you pain diary.  Expect:  From numbing medicine (AKA: Local Anesthetics): Numbness or decrease in pain.  Onset: Full effect within 15 minutes of  injected.  Duration: It will depend on the type of local anesthetic used. On the average, 1 to 8 hours.   From steroids: Decrease in swelling or inflammation. Once inflammation is improved, relief of the pain will follow.  Onset of benefits: Depends on the amount of swelling present. The more swelling, the longer it will take for the benefits to be seen. In some cases, up to 10 days.  Duration: Steroids will stay in the system x 2 weeks. Duration of benefits will depend on multiple posibilities including persistent irritating factors.  From procedure: Some discomfort is to be expected once the numbing medicine wears off. This should be minimal if ice and heat are applied as instructed. Call if:  You experience numbness and weakness that gets worse with time, as opposed to wearing off.  New onset bowel or bladder incontinence. (Spinal procedures only)  Emergency Numbers:  Durning business hours (Monday - Thursday, 8:00 AM - 4:00 PM) (Friday, 9:00 AM - 12:00 Noon): [phone]  After hours: [phone] ____________________________________________________________________________________________  Pain Management Discharge Instructions  General Discharge Instructions :  If you need to reach your doctor call: Monday-Friday 8:00 am - 4:00 pm at [phone] or toll free [phone].  After clinic hours [phone] to have operator reach doctor.  Bring all of your medication bottles to all your appointments in the pain clinic.  To cancel or reschedule your appointment with Pain Management please remember to call 24 hours in advance to avoid a fee.  Refer to the educational materials which you have been given on: General Risks, I had my Procedure. Discharge Instructions, Post Sedation.  Post Procedure Instructions:  The drugs you   were given will stay in your system until tomorrow, so for the next 24 hours you should not drive, make any legal decisions or drink any alcoholic  beverages.  You may eat anything you prefer, but it is better to start with liquids then soups and crackers, and gradually work up to solid foods.  Please notify your doctor immediately if you have any unusual bleeding, trouble breathing or pain that is not related to your normal pain.  Depending on the type of procedure that was done, some parts of your body may feel week and/or numb.  This usually clears up by tonight or the next day.  Walk with the use of an assistive device or accompanied by an adult for the 24 hours.  You may use ice on the affected area for the first 24 hours.  Put ice in a Ziploc bag and cover with a towel and place against area 15 minutes on 15 minutes off.  You may switch to heat after 24 hours.Intercostal Nerve Block Patient Information  Description: The twelve intercostal nerves arise from the first thru twelfth thoracic nerve roots.  The nerve begins at the spine and wraps around the body, lying in a groove underneath each rib.  Each intercostal nerve innervates a specific strip of skin and body walk of the abdomen and chest.  Therefore, injuries of the chest wall or abdominal wall result in pain that is transmitted back to the brian via the intercostal nerves.  Examples of such injuries include rib fractures and incisions for lung and gall bladder surgery.  Occasionally, pain may persist long after an injury or surgical incision secondary to inflammation and irritation of the intercostal nerve.  The longstanding pain is known as intercostal neuralgia.  An intercostal nerve block is preformed to eliminate pain either temporarily or permanently.  A small needle is placed below the rib and local anesthetic (like Novocaine) and possibly steroid is injected.  Usually 2-4 intercostal nerves are blocked at a time depending on the problem.  The patient will experience a slight "pin-prick" sensation for each injection.  Shortly thereafter, the strip of skin that is innervated by  the blocked intercostal nerve will feel numb.  Persistent pain that is only temporarily relieved with local anesthetic may require a more permanent block. This procedure is called Cryoneurolysis and entails placing a small probe beneath the rib to freeze the nerve.  Conditions that may be treated by intercostal nerve blocks:   Rib fractures  Longstanding pain from surgery of the chest or abdomen (intercostal neuralgia)  Pain from chest tubes  Pain from trauma to the chest  Preparation for the injections:  1. Do not eat any solid food or dairy products within 8 hours of your appointment. 2. You may drink clear liquids up to 3 hours before appointment.  Clear liquids include water, black coffee, juice or soda.  No milk or cream please. 3. You may take your regular medication, including pain medications, with a sip of water before your appointment.  Diabetics should hold regular insulin (if take separately) and take 1/2 normal NPH dose the morning of the procedure.   Carry some sugar containing items with you to your appointment. 4. A driver must accompany you and be prepared to drive you home after your procedure. 5. Bring all your current medications with you. 6. An IV may be inserted and sedation may be given at the discretion of the physician. 7. A blood pressure cuff, EKG and other monitors  will often be applied during the procedure.  Some patients may need to have extra oxygen administered for a short period. 8. You will be asked to provide medical information, including your allergies, prior to the procedure.  We must know immediately if you are taking blood thinners (like Coumadin/Warfarin) or if you are allergic to IV iodine contrast (dye). We must know if you could possible be pregnant.  Possible side-effects:   Bleeding from needle site  Infection (rare)  Nerve injury (rare)  Numbness & tingling of skin  Collapsed lung requiring chest tube (rare)  Local anesthetic  toxicity (rare)  Light-headedness (temporary)  Pain at injection site (several days)  Decreased blood pressure (temporary)  Shortness of breath  Jittery/shaking sensation (temporary)  Call if you experience:   Difficulty breathing or hives (go directly to the emergency room)  Redness, inflammation or drainage at the injection site  Severe pain at the site of the injection  Any new symptoms which are concerning   Please note:  Your pain may subside immediately but may return several hours after the injection.  Often, more than one injection is required to reduce the pain. Also, if several temporary blocks with local anesthetic are ineffective, a more permanent block with cryolysis may be necessary.  This will be discussed with you should this be the case.  If you have any questions, please call [phone] [place]  What are the risk, side effects and possible complications? Generally speaking, most procedures are safe.  However, with any procedure there are risks, side effects, and the possibility of complications.  The risks and complications are dependent upon the sites that are lesioned, or the type of nerve block to be performed.  The closer the procedure is to the spine, the more serious the risks are.  Great care is taken when placing the radio frequency needles, block needles or lesioning probes, but sometimes complications can occur. 1. Infection: Any time there is an injection through the skin, there is a risk of infection.  This is why sterile conditions are used for these blocks.  There are four possible types of infection. 1. Localized skin infection. 2. Central Nervous System Infection-This can be in the form of Meningitis, which can be deadly. 3. Epidural Infections-This can be in the form of an epidural abscess, which can cause pressure inside of the spine, causing compression of the spinal  cord with subsequent paralysis. This would require an emergency surgery to decompress, and there are no guarantees that the patient would recover from the paralysis. 4. Discitis-This is an infection of the intervertebral discs.  It occurs in about 1% of discography procedures.  It is difficult to treat and it may lead to surgery.        2. Pain: the needles have to go through skin and soft tissues, will cause soreness.       3. Damage to internal structures:  The nerves to be lesioned may be near blood vessels or    other nerves which can be potentially damaged.       4. Bleeding: Bleeding is more common if the patient is taking blood thinners such as  aspirin, Coumadin, Ticiid, Plavix, etc., or if he/she have some genetic predisposition  such as hemophilia. Bleeding into the spinal canal can cause compression of the spinal  cord with subsequent paralysis.  This would require an emergency surgery to  decompress and there  are no guarantees that the patient would recover from the  paralysis.       5. Pneumothorax:  Puncturing of a lung is a possibility, every time a needle is introduced in  the area of the chest or upper back.  Pneumothorax refers to free air around the  collapsed lung(s), inside of the thoracic cavity (chest cavity).  Another two possible  complications related to a similar event would include: Hemothorax and Chylothorax.   These are variations of the Pneumothorax, where instead of air around the collapsed  lung(s), you may have blood or chyle, respectively.       6. Spinal headaches: They may occur with any procedures in the area of the spine.       7. Persistent CSF (Cerebro-Spinal Fluid) leakage: This is a rare problem, but may occur  with prolonged intrathecal or epidural catheters either due to the formation of a fistulous  track or a dural tear.       8. Nerve damage: By working so close to the spinal cord, there is always a possibility of  nerve damage, which could be as serious as a  permanent spinal cord injury with  paralysis.       9. Death:  Although rare, severe deadly allergic reactions known as "Anaphylactic  reaction" can occur to any of the medications used.      10. Worsening of the symptoms:  We can always make thing worse.  What are the chances of something like this happening? Chances of any of this occuring are extremely low.  By statistics, you have more of a chance of getting killed in a motor vehicle accident: while driving to the hospital than any of the above occurring .  Nevertheless, you should be aware that they are possibilities.  In general, it is similar to taking a shower.  Everybody knows that you can slip, hit your head and get killed.  Does that mean that you should not shower again?  Nevertheless always keep in mind that statistics do not mean anything if you happen to be on the wrong side of them.  Even if a procedure has a 1 (one) in a 1,000,000 (million) chance of going wrong, it you happen to be that one..Also, keep in mind that by statistics, you have more of a chance of having something go wrong when taking medications.  Who should not have this procedure? If you are on a blood thinning medication (e.g. Coumadin, Plavix, see list of "Blood Thinners"), or if you have an active infection going on, you should not have the procedure.  If you are taking any blood thinners, please inform your physician.  How should I prepare for this procedure?  Do not eat or drink anything at least six hours prior to the procedure.  Bring a driver with you .  It cannot be a taxi.  Come accompanied by an adult that can drive you back, and that is strong enough to help you if your legs get weak or numb from the local anesthetic.  Take all of your medicines the morning of the procedure with just enough water to swallow them.  If you have diabetes, make sure that you are scheduled to have your procedure done first thing in the morning, whenever possible.  If you  have diabetes, take only half of your insulin dose and notify our nurse that you have done so as soon as you arrive at the clinic.  If you are diabetic,  but only take blood sugar pills (oral hypoglycemic), then do not take them on the morning of your procedure.  You may take them after you have had the procedure.  Do not take aspirin or any aspirin-containing medications, at least eleven (11) days prior to the procedure.  They may prolong bleeding.  Wear loose fitting clothing that may be easy to take off and that you would not mind if it got stained with Betadine or blood.  Do not wear any jewelry or perfume  Remove any nail coloring.  It will interfere with some of our monitoring equipment.  NOTE: Remember that this is not meant to be interpreted as a complete list of all possible complications.  Unforeseen problems may occur.  BLOOD THINNERS The following drugs contain aspirin or other products, which can cause increased bleeding during surgery and should not be taken for 2 weeks prior to and 1 week after surgery.  If you should need take something for relief of minor pain, you may take acetaminophen which is found in Tylenol,m Datril, Anacin-3 and Panadol. It is not blood thinner. The products listed below are.  Do not take any of the products listed below in addition to any listed on your instruction sheet.  A.P.C or A.P.C with Codeine Codeine Phosphate Capsules #3 Ibuprofen Ridaura  ABC compound Congesprin Imuran rimadil  Advil Cope Indocin Robaxisal  Alka-Seltzer Effervescent Pain Reliever and Antacid Coricidin or Coricidin-D  Indomethacin Rufen  Alka-Seltzer plus Cold Medicine Cosprin Ketoprofen S-A-C Tablets  Anacin Analgesic Tablets or Capsules Coumadin Korlgesic Salflex  Anacin Extra Strength Analgesic tablets or capsules CP-2 Tablets Lanoril Salicylate  Anaprox Cuprimine Capsules Levenox Salocol  Anexsia-D Dalteparin Magan Salsalate  Anodynos Darvon compound Magnesium  Salicylate Sine-off  Ansaid Dasin Capsules Magsal Sodium Salicylate  Anturane Depen Capsules Marnal Soma  APF Arthritis pain formula Dewitt's Pills Measurin Stanback  Argesic Dia-Gesic Meclofenamic Sulfinpyrazone  Arthritis Bayer Timed Release Aspirin Diclofenac Meclomen Sulindac  Arthritis pain formula Anacin Dicumarol Medipren Supac  Analgesic (Safety coated) Arthralgen Diffunasal Mefanamic Suprofen  Arthritis Strength Bufferin Dihydrocodeine Mepro Compound Suprol  Arthropan liquid Dopirydamole Methcarbomol with Aspirin Synalgos  ASA tablets/Enseals Disalcid Micrainin Tagament  Ascriptin Doan's Midol Talwin  Ascriptin A/D Dolene Mobidin Tanderil  Ascriptin Extra Strength Dolobid Moblgesic Ticlid  Ascriptin with Codeine Doloprin or Doloprin with Codeine Momentum Tolectin  Asperbuf Duoprin Mono-gesic Trendar  Aspergum Duradyne Motrin or Motrin IB Triminicin  Aspirin plain, buffered or enteric coated Durasal Myochrisine Trigesic  Aspirin Suppositories Easprin Nalfon Trillsate  Aspirin with Codeine Ecotrin Regular or Extra Strength Naprosyn Uracel  Atromid-S Efficin Naproxen Ursinus  Auranofin Capsules Elmiron Neocylate Vanquish  Axotal Emagrin Norgesic Verin  Azathioprine Empirin or Empirin with Codeine Normiflo Vitamin E  Azolid Emprazil Nuprin Voltaren  Bayer Aspirin plain, buffered or children's or timed BC Tablets or powders Encaprin Orgaran Warfarin Sodium  Buff-a-Comp Enoxaparin Orudis Zorpin  Buff-a-Comp with Codeine Equegesic Os-Cal-Gesic   Buffaprin Excedrin plain, buffered or Extra Strength Oxalid   Bufferin Arthritis Strength Feldene Oxphenbutazone   Bufferin plain or Extra Strength Feldene Capsules Oxycodone with Aspirin   Bufferin with Codeine Fenoprofen Fenoprofen Pabalate or Pabalate-SF   Buffets II Flogesic Panagesic   Buffinol plain or Extra Strength Florinal or Florinal with Codeine Panwarfarin   Buf-Tabs Flurbiprofen Penicillamine   Butalbital Compound Four-way  cold tablets Penicillin   Butazolidin Fragmin Pepto-Bismol   Carbenicillin Geminisyn Percodan   Carna Arthritis Reliever Geopen Persantine   Carprofen Gold's salt Persistin   Chloramphenicol Goody's Phenylbutazone  Chloromycetin Haltrain Piroxlcam   Clmetidine heparin Plaquenil   Cllnoril Hyco-pap Ponstel   Clofibrate Hydroxy chloroquine Propoxyphen         Before stopping any of these medications, be sure to consult the physician who ordered them.  Some, such as Coumadin (Warfarin) are ordered to prevent or treat serious conditions such as "deep thrombosis", "pumonary embolisms", and other heart problems.  The amount of time that you may need off of the medication may also vary with the medication and the reason for which you were taking it.  If you are taking any of these medications, please make sure you notify your pain physician before you undergo any procedures.

## 2017-03-03 NOTE — Progress Notes (Signed)
Nursing Pain Medication Assessment:  Safety precautions to be maintained throughout the outpatient stay will include: orient to surroundings, keep bed in low position, maintain call bell within reach at all times, provide assistance with transfer out of bed and ambulation.  Medication Inspection Compliance: Kari Gibbs did not comply with our request to bring her pills to be counted. She was reminded that bringing the medication bottles, even when empty, is a requirement.  Medication: None brought in. Pill/Patch Count: None available to be counted. Bottle Appearance: No container available. Did not bring bottle(s) to appointment. Filled Date: 07/21/2018Last Medication intake:  Yesterday

## 2017-03-03 NOTE — Progress Notes (Signed)
Patient's Name: Kari Gibbs  MRN: [id number]  Referring Provider: Delano Metz, MD  DOB: [birth year]  PCP: Gweneth Dimitri, MD  DOS: 03/03/2017  Note by: Oswaldo Done, MD  Service setting: Ambulatory outpatient  Specialty: Interventional Pain Management  Patient type: Established  Location: [facility] (AMB) Pain Management Facility  Visit type: Interventional Procedure + Med Mgmnt   Primary Reason for Visit: Interventional Pain Management Treatment. CC: Chest Pain (rib cage and radiates to the left side) and Back Pain (mid (goes thru chest wall to the back))  Procedure:  Anesthesia, Analgesia, Anxiolysis:  Type: Diagnostic Posterior and anterior Intercostal  Nerve Block Region: Anterior and posterolateral Thoracic Area Level: 8th Rib Laterality: Left-Sided Position: Right Lateral Decubitus  Type: Local Anesthesia with Moderate (Conscious) Sedation Local Anesthetic: Lidocaine 1% Route: Intravenous (IV) IV Access: Secured Sedation: Meaningful verbal contact was maintained at all times during the procedure  Indication(s): Analgesia and Anxiety  Indications: 1. Acute costochondritis (Left)   2. Chronic flank pain (Location of Primary Source of Pain) (Left)   3. History of rib fracture (Left 4th and 8th rib)   4. Chronic pain syndrome   5. Neurogenic pain   6. Long term prescription opiate use   7. Opiate use (60 MME/day)    Pain Score: Pre-procedure: 4  (pain goes thru chest wall to the back)/10 Post-procedure: 2  (pain is mostly in the epigastric)/10  Pre-op Assessment:  Previous date of service: 01/21/17 Service provided: Evaluation Kari Gibbs is a 66 y.o. (year old), female patient, seen today for interventional treatment. 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 Kari Gibbs has a current medication list which includes the following prescription(s): albuterol, cholecalciferol, conjugated estrogens, gabapentin, losartan, magnesium, metronidazole, morphine, morphine, morphine, naloxone, omeprazole, potassium gluconate, premarin, prenatal vit-fe fum-fa-omega, align, teriparatide (recombinant), valsartan, venlafaxine xr, and benefiber, and the following Facility-Administered Medications: fentanyl and midazolam. Her primarily concern today is the Chest Pain (rib cage and radiates to the left side) and Back Pain (mid (goes thru chest wall to the back))  Initial Vital Signs: Blood pressure 111/69, pulse 67, temperature 97.8 F (36.6 C), resp. rate 16, height 5\' 6"  (1.676 m), weight 190 lb (86.2 kg), SpO2 97 %. BMI: Estimated body mass index is 30.67 kg/m as calculated from the following:   Height as of this encounter: 5\' 6"  (1.676 m).   Weight as of this encounter: 190 lb (86.2 kg).  Risk Assessment: Allergies: Reviewed. She is allergic to iohexol; ace inhibitors; losartan potassium; and neosporin [neomycin-bacitracin zn-polymyx].  Allergy Precautions: No iodine containing solutions or radiological contrast used. Coagulopathies: Reviewed. None identified.  Blood-thinner therapy: None at this time Active Infection(s): Reviewed. None identified. Kari Gibbs is afebrile  Site Confirmation: Kari Gibbs was asked to confirm the procedure and laterality before marking the site Procedure checklist: Completed Consent: Before the procedure and under the influence of no sedative(s), amnesic(s), or anxiolytics, the patient was informed of the treatment options, risks and possible complications. To fulfill our ethical and legal obligations, as recommended by the American Medical Association's Code of Ethics, I have informed the patient of my clinical impression; the nature and purpose of the treatment or procedure; the  risks, benefits, and possible complications of the intervention; the alternatives, including doing nothing; the risk(s) and benefit(s) of the alternative treatment(s)  or procedure(s); and the risk(s) and benefit(s) of doing nothing. The patient was provided information about the general risks and possible complications associated with the procedure. These may include, but are not limited to: failure to achieve desired goals, infection, bleeding, organ or nerve damage, allergic reactions, paralysis, and death. In addition, the patient was informed of those risks and complications associated to the procedure, such as failure to decrease pain; infection; bleeding; organ or nerve damage with subsequent damage to sensory, motor, and/or autonomic systems, resulting in permanent pain, numbness, and/or weakness of one or several areas of the body; allergic reactions; (i.e.: anaphylactic reaction); and/or death. Furthermore, the patient was informed of those risks and complications associated with the medications. These include, but are not limited to: allergic reactions (i.e.: anaphylactic or anaphylactoid reaction(s)); adrenal axis suppression; blood sugar elevation that in diabetics may result in ketoacidosis or comma; water retention that in patients with history of congestive heart failure may result in shortness of breath, pulmonary edema, and decompensation with resultant heart failure; weight gain; swelling or edema; medication-induced neural toxicity; particulate matter embolism and blood vessel occlusion with resultant organ, and/or nervous system infarction; and/or aseptic necrosis of one or more joints. Finally, the patient was informed that Medicine is not an exact science; therefore, there is also the possibility of unforeseen or unpredictable risks and/or possible complications that may result in a catastrophic outcome. The patient indicated having understood very clearly. We have given the patient no  guarantees and we have made no promises. Enough time was given to the patient to ask questions, all of which were answered to the patient's satisfaction. Kari Gibbs has indicated that she wanted to continue with the procedure. Attestation: I, the ordering provider, attest that I have discussed with the patient the benefits, risks, side-effects, alternatives, likelihood of achieving goals, and potential problems during recovery for the procedure that I have provided informed consent. Date: 03/03/2017; Time: 10:27 AM  Controlled Substance Pharmacotherapy Assessment REMS (Risk Evaluation and Mitigation Strategy)  Analgesic:MS Contin 30 mg every 12 hours (60 mg/day) MME/day:60 mg/day  Lona Millard, RN  03/03/2017 11:35 AM  Sign at close encounter Nursing Pain Medication Assessment:  Safety precautions to be maintained throughout the outpatient stay will include: orient to surroundings, keep bed in low position, maintain call bell within reach at all times, provide assistance with transfer out of bed and ambulation.  Medication Inspection Compliance: Ms. Perona did not comply with our request to bring her pills to be counted. She was reminded that bringing the medication bottles, even when empty, is a requirement.  Medication: None brought in. Pill/Patch Count: None available to be counted. Bottle Appearance: No container available. Did not bring bottle(s) to appointment. Filled Date: 07/21/2018Last Medication intake:  Yesterday   Pharmacokinetics: Liberation and absorption (onset of action): WNL Distribution (time to peak effect): WNL Metabolism and excretion (duration of action): WNL         Pharmacodynamics: Desired effects: Analgesia: Ms. Ivanov reports >50% benefit. Functional ability: Patient reports that medication allows her to accomplish basic ADLs Clinically meaningful improvement in function (CMIF): Sustained CMIF goals met Perceived effectiveness: Described as relatively  effective, allowing for increase in activities of daily living (ADL) Undesirable effects: Side-effects or Adverse reactions: None reported Monitoring: [place] PMP: Online review of the past 84-monthperiod conducted. Compliant with practice rules and regulations List of all UDS test(s) done:  Lab Results  Component Value [place]   [place]   [place] 09[place]  [place] FINAL 06/28/2015   SUMMARY FINAL 12/11/2016   Last UDS on record: [employer] Select 13  Date Value Ref Range Status  01/04/2016 FINAL  Final    Comment:    ==================================================================== TOXASSURE SELECT 13 (MW) ==================================================================== Test                             Result       Flag       Units Drug Present and Declared for Prescription Verification   Morphine                       10334        EXPECTED   ng/mg creat    Potential sources of large amounts of morphine in the absence of    codeine include administration of morphine or use of heroin.   Hydromorphone                  213          EXPECTED   ng/mg creat    Hydromorphone may be present as a metabolite of morphine;    concentrations of hydromorphone rarely exceed 5% of the morphine    concentration when this is the source of hydromorphone. ==================================================================== Test                      Result    Flag   Units      Ref Range   Creatinine              90               mg/dL      >=20 ==================================================================== Declared Medications:  The flagging and interpretation on this report are based on the  following declared medications.  Unexpected results may arise from  inaccuracies in the declared medications.  **Note: The testing scope of this panel includes these medications:  Morphine  **Note: The testing scope of this panel does not include following   reported medications:  Chlorpheniramine  Doxycycline  Gabapentin  Iron (Ferrous Sulfate)  Lubiprostone  Magnesium  Metronidazole  Multivitamin (Prenatal Vitamin)  Naloxone  Omeprazole  Potassium  Supplement  Supplement (Probiotic)  Teriparatide  Triamcinolone acetonide  Valsartan  Venlafaxine  Vitamin D2 (Ergocalciferol) ==================================================================== For clinical consultation, please call [phone]. ====================================================================    Summary  Date Value Ref Range Status  12/11/2016 FINAL  Final    Comment:    ==================================================================== TOXASSURE SELECT 13 (MW) ==================================================================== Test                             Result       Flag       Units Drug Present and Declared for Prescription Verification   Morphine                       >5155        EXPECTED   ng/mg creat    Potential sources of large amounts of morphine in the absence of    codeine include administration of morphine or use of heroin.   Hydromorphone                  207          EXPECTED   ng/mg creat    Hydromorphone may be present  as a metabolite of morphine;    concentrations of hydromorphone rarely exceed 5% of the morphine    concentration when this is the source of hydromorphone. ==================================================================== Test                      Result    Flag   Units      Ref Range   Creatinine              194              mg/dL      >=20 ==================================================================== Declared Medications:  The flagging and interpretation on this report are based on the  following declared medications.  Unexpected results may arise from  inaccuracies in the declared medications.  **Note: The testing scope of this panel includes these medications:  Morphine (MS Contin)  **Note: The  testing scope of this panel does not include following  reported medications:  Buspirone (BuSpar)  Estrogen (Premarin)  Gabapentin  Iron  Magnesium  Metronidazole  Multivitamin (Prenatal Vit)  Naloxone (Narcan)  Omeprazole  Potassium  Supplement  Supplement (Probiotic)  Teriparatide  Triamcinolone (Kenalog)  Valsartan  Venlafaxine  Vitamin D ==================================================================== For clinical consultation, please call [phone]. ====================================================================    UDS interpretation: Compliant          Medication Assessment Form: Reviewed. Patient indicates being compliant with therapy Treatment compliance: Compliant Risk Assessment Profile: Aberrant behavior: See prior evaluations. None observed or detected today Comorbid factors increasing risk of overdose: See prior notes. No additional risks detected today Risk of substance use disorder (SUD): Low Opioid Risk Tool (ORT) Total Score: 1  Interpretation Table:  Score <3 = Low Risk for SUD  Score between 4-7 = Moderate Risk for SUD  Score >8 = High Risk for Opioid Abuse   Risk Mitigation Strategies:  Patient Counseling: Covered Patient-Prescriber Agreement (PPA): Present and active  Notification to other healthcare providers: Done  Pharmacologic Plan: No change in therapy, at this time  Pre-Procedure Preparation:  Monitoring: As per clinic protocol. Respiration, ETCO2, SpO2, BP, heart rate and rhythm monitor placed and checked for adequate function Safety Precautions: Patient was assessed for positional comfort and pressure points before starting the procedure. Time-out: I initiated and conducted the "Time-out" before starting the procedure, as per protocol. The patient was asked to participate by confirming the accuracy of the "Time Out" information. Verification of the correct person, site, and procedure were performed and confirmed by me, the  nursing staff, and the patient. "Time-out" conducted as per Joint Commission's Universal Protocol (UP.01.01.01). "Time-out" Date & Time: 03/03/2017; 1115 hrs.  Description of Procedure Process:   Target Area: The sub-costal neurovascular bundle area Approach: Sub-costal approach Area Prepped: Entire Mid-Axillary Thoracic Region Prepping solution: ChloraPrep (2% chlorhexidine gluconate and 70% isopropyl alcohol) Safety Precautions: Aspiration looking for blood return was conducted prior to all injections. At no point did we inject any substances, as a needle was being advanced. No attempts were made at seeking any paresthesias. Safe injection practices and needle disposal techniques used. Medications properly checked for expiration dates. SDV (single dose vial) medications used. Description of the Procedure: Protocol guidelines were followed. The patient was placed in position over the procedure table. The target area was identified and the area prepped in the usual manner. Skin & deeper tissues infiltrated with local anesthetic. After cleaning the skin with an antiseptic solution, 1-2 mL of dilute local anesthetic was infiltrated subcutaneously at the planned injection site.  The fingers of the palpating hand were used to straddle the insertion site at the inferior border of the rib and fix the skin to avoid unwanted skin movement. Appropriate amount of time allowed to pass for local anesthetics to take effect. The procedure needles were then advanced to the target area at an angle of approximately 20 cephalad to the skin. Contact with the rib was made. While maintaining the same angle of insertion, the needle was walked off the inferior border of the rib as the skin was allowed to return to its initial position. Then the needle was advanced no more than 3 mm below the inferior margin of the rib. Proper needle placement was secured. Negative aspiration confirmed. Following negative aspiration for blood or  air, 3-5 mL of local anesthetic was injected. The solution injected in intermittent fashion, asking for systemic symptoms every 0.5cc of injectate. The needle was then removed and the area cleansed, making sure to leave some of the prepping solution back to take advantage of its long term bactericidal properties.  Vitals:   03/03/17 1129 03/03/17 1139 03/03/17 1149 03/03/17 1159  BP: (!) 129/57 116/71 (!) 122/58 (!) 116/57  Pulse: 67 69 62 63  Resp: '10 15 14 11  '$ Temp:  (!) 96.7 F (35.9 C)    SpO2: 100% 94% 100% 98%  Weight:      Height:        Start Time: 1118 hrs. End Time: 1128 hrs.   Materials:  Needle(s) Type: Regular needle Gauge: 22G Length: 1.5-in Medication(s): We administered lactated ringers, midazolam, fentaNYL, lidocaine, ropivacaine (PF) 2 mg/mL (0.2%), and triamcinolone acetonide. Please see chart orders for dosing details.  Imaging Guidance (Non-Spinal):  Type of Imaging Technique: Fluoroscopy Guidance (Non-Spinal) Indication(s): Assistance in needle guidance and placement for procedures requiring needle placement in or near specific anatomical locations not easily accessible without such assistance. Exposure Time: Please see nurses notes. Contrast: None used. Fluoroscopic Guidance: I was personally present during the use of fluoroscopy. "Tunnel Vision Technique" used to obtain the best possible view of the target area. Parallax error corrected before commencing the procedure. "Direction-depth-direction" technique used to introduce the needle under continuous pulsed fluoroscopy. Once target was reached, antero-posterior, oblique, and lateral fluoroscopic projection used confirm needle placement in all planes. Images permanently stored in EMR. Interpretation: No contrast injected.  Antibiotic Prophylaxis:  Indication(s): None identified Antibiotic given: None  Post-operative Assessment:  EBL: None Complications: No immediate post-treatment complications observed by  team, or reported by patient. Note: The patient tolerated the entire procedure well. A repeat set of vitals were taken after the procedure and the patient was kept under observation following institutional policy, for this type of procedure. Post-procedural neurological assessment was performed, showing return to baseline, prior to discharge. The patient was provided with post-procedure discharge instructions, including a section on how to identify potential problems. Should any problems arise concerning this procedure, the patient was given instructions to immediately contact us, at any time, without hesitation. In any case, we plan to contact the patient by telephone for a follow-up status report regarding this interventional procedure. Comments:  No additional relevant information.  Plan of Care  Disposition: Discharge home  Discharge Date & Time: 03/03/2017; 1159 hrs.  Physician-requested Follow-up:  Return for post-procedure eval (in 2 wks), by MD, in addition, Med-Mgmt on 06/26/17, w/ NP.  Future Appointments Date Time Provider [place]  03/26/2017 9:00 AM [name], MD [facility] None  06/26/2017 9:00 AM Vevelyn Francois, NP [facility]  None   Medications ordered for procedure: Meds ordered this encounter  Medications  . lactated ringers infusion 1,000 mL  . midazolam (VERSED) 5 MG/5ML injection 1-2 mg    Make sure Flumazenil is available in the pyxis when using this medication. If oversedation occurs, administer 0.2 mg IV over 15 sec. If after 45 sec no response, administer 0.2 mg again over 1 min; may repeat at 1 min intervals; not to exceed 4 doses (1 mg)  . fentaNYL (SUBLIMAZE) injection 25-50 mcg    Make sure Narcan is available in the pyxis when using this medication. In the event of respiratory depression (RR< 8/min): Titrate NARCAN (naloxone) in increments of 0.1 to 0.2 mg IV at 2-3 minute intervals, until desired degree of reversal.  . lidocaine 1.5 % injection 20  mL    From block tray  . ropivacaine (PF) 2 mg/mL (0.2%) (NAROPIN) injection 9 mL  . triamcinolone acetonide (KENALOG-40) injection 40 mg  . morphine (MS CONTIN) 30 MG 12 hr tablet    Sig: Take 1 tablet (30 mg total) by mouth every 12 (twelve) hours.    Dispense:  60 tablet    Refill:  0    Do not place this medication, or any other prescription from our practice, on "Automatic Refill". Patient may have prescription filled one day early if pharmacy is closed on scheduled refill date. Do not fill until: 04/30/17 To last until: 05/30/17  . morphine (MS CONTIN) 30 MG 12 hr tablet    Sig: Take 1 tablet (30 mg total) by mouth every 12 (twelve) hours.    Dispense:  60 tablet    Refill:  0    Do not place this medication, or any other prescription from our practice, on "Automatic Refill". Patient may have prescription filled one day early if pharmacy is closed on scheduled refill date. Do not fill until: 05/30/17 To last until: 06/29/17  . morphine (MS CONTIN) 30 MG 12 hr tablet    Sig: Take 1 tablet (30 mg total) by mouth every 12 (twelve) hours.    Dispense:  60 tablet    Refill:  0    Do not place this medication, or any other prescription from our practice, on "Automatic Refill". Patient may have prescription filled one day early if pharmacy is closed on scheduled refill date. Do not fill until: 03/31/17 To last until: 04/30/17  . gabapentin (NEURONTIN) 300 MG capsule    Sig: Take 3 capsules (900 mg total) by mouth 2 (two) times daily. May go up to 3 capsules 4 times a day. Titrate up slowly.    Dispense:  180 capsule    Refill:  2    Do not place this medication, or any other prescription from our practice, on "Automatic Refill". Patient may have prescription filled one day early if pharmacy is closed on scheduled refill date.   Medications administered: We administered lactated ringers, midazolam, fentaNYL, lidocaine, ropivacaine (PF) 2 mg/mL (0.2%), and triamcinolone acetonide.  See  the medical record for exact dosing, route, and time of administration.  Lab-work, Procedure(s), & Referral(s) Ordered: Orders Placed This Encounter  Procedures  . DG C-Arm 1-60 Min-No Report  . Informed Consent Details: Transcribe to consent form and obtain patient signature  . Provider attestation of informed consent for procedure/surgical case  . Verify informed consent  . Discharge instructions  . Follow-up   Imaging Ordered: Results for orders placed in visit on 07/17/16  DG C-Arm 1-60 Min-No Report  Narrative There is no report for this exam.   New Prescriptions   No medications on file   Primary Care Physician: Gweneth Dimitri, MD Location: [facility] Note by: Oswaldo Done, MD Date: 03/03/2017; Time: 2:20 PM  Disclaimer:  Medicine is not an Visual merchandiser. The only guarantee in medicine is that nothing is guaranteed. It is important to note that the decision to proceed with this intervention was based on the information collected from the patient. The Data and conclusions were drawn from the patient's questionnaire, the interview, and the physical examination. Because the information was provided in large part by the patient, it cannot be guaranteed that it has not been purposely or unconsciously manipulated. Every effort has been made to obtain as much relevant data as possible for this evaluation. It is important to note that the conclusions that lead to this procedure are derived in large part from the available data. Always take into account that the treatment will also be dependent on availability of resources and existing treatment guidelines, considered by other Pain Management Practitioners as being common knowledge and practice, at the time of the intervention. For Medico-Legal purposes, it is also important to point out that variation in procedural techniques and pharmacological choices are the acceptable norm. The indications,  contraindications, technique, and results of the above procedure should only be interpreted and judged by a Board-Certified Interventional Pain Specialist with extensive familiarity and expertise in the same exact procedure and technique.  Instructions provided at this appointment: Patient Instructions   ____________________________________________________________________________________________  Post-Procedure instructions Instructions:  Apply ice: Fill a plastic sandwich bag with crushed ice. Cover it with a small towel and apply to injection site. Apply for 15 minutes then remove x 15 minutes. Repeat sequence on day of procedure, until you go to bed. The purpose is to minimize swelling and discomfort after procedure.  Apply heat: Apply heat to procedure site starting the day following the procedure. The purpose is to treat any soreness and discomfort from the procedure.  Food intake: Start with clear liquids (like water) and advance to regular food, as tolerated.   Physical activities: Keep activities to a minimum for the first 8 hours after the procedure.   Driving: If you have received any sedation, you are not allowed to drive for 24 hours after your procedure.  Blood thinner: Restart your blood thinner 6 hours after your procedure. (Only for those taking blood thinners)  Insulin: As soon as you can eat, you may resume your normal dosing schedule. (Only for those taking insulin)  Infection prevention: Keep procedure site clean and dry.  Post-procedure Pain Diary: Extremely important that this be done correctly and accurately. Recorded information will be used to determine the next step in treatment.  Pain evaluated is that of treated area only. Do not include pain from an untreated area.  Complete every hour, on the hour, for the initial 8 hours. Set an alarm to help you do this part accurately.  Do not go to sleep and have it completed later. It will not be  accurate.  Follow-up appointment: Keep your follow-up appointment after the procedure. Usually 2 weeks for most procedures. (6 weeks in the case of radiofrequency.) Bring you pain diary.  Expect:  From numbing medicine (AKA: Local Anesthetics): Numbness or decrease in pain.  Onset: Full effect within 15 minutes of injected.  Duration: It will depend on the type of local anesthetic used. On the average, 1 to 8 hours.  From steroids: Decrease in swelling or inflammation. Once inflammation is improved, relief of the pain will follow.  Onset of benefits: Depends on the amount of swelling present. The more swelling, the longer it will take for the benefits to be seen. In some cases, up to 10 days.  Duration: Steroids will stay in the system x 2 weeks. Duration of benefits will depend on multiple posibilities including persistent irritating factors.  From procedure: Some discomfort is to be expected once the numbing medicine wears off. This should be minimal if ice and heat are applied as instructed. Call if:  You experience numbness and weakness that gets worse with time, as opposed to wearing off.  New onset bowel or bladder incontinence. (Spinal procedures only)  Emergency Numbers:  Durning business hours (Monday - Thursday, 8:00 AM - 4:00 PM) (Friday, 9:00 AM - 12:00 Noon): [phone]  After hours: [phone] ____________________________________________________________________________________________  Pain Management Discharge Instructions  General Discharge Instructions :  If you need to reach your doctor call: Monday-Friday 8:00 am - 4:00 pm at [phone] or toll free [phone].  After clinic hours [phone] to have operator reach doctor.  Bring all of your medication bottles to all your appointments in the pain clinic.  To cancel or reschedule your appointment with Pain Management please remember to call 24 hours in advance to avoid a fee.  Refer to the  educational materials which you have been given on: General Risks, I had my Procedure. Discharge Instructions, Post Sedation.  Post Procedure Instructions:  The drugs you were given will stay in your system until tomorrow, so for the next 24 hours you should not drive, make any legal decisions or drink any alcoholic beverages.  You may eat anything you prefer, but it is better to start with liquids then soups and crackers, and gradually work up to solid foods.  Please notify your doctor immediately if you have any unusual bleeding, trouble breathing or pain that is not related to your normal pain.  Depending on the type of procedure that was done, some parts of your body may feel week and/or numb.  This usually clears up by tonight or the next day.  Walk with the use of an assistive device or accompanied by an adult for the 24 hours.  You may use ice on the affected area for the first 24 hours.  Put ice in a Ziploc bag and cover with a towel and place against area 15 minutes on 15 minutes off.  You may switch to heat after 24 hours.Intercostal Nerve Block Patient Information  Description: The twelve intercostal nerves arise from the first thru twelfth thoracic nerve roots.  The nerve begins at the spine and wraps around the body, lying in a groove underneath each rib.  Each intercostal nerve innervates a specific strip of skin and body walk of the abdomen and chest.  Therefore, injuries of the chest wall or abdominal wall result in pain that is transmitted back to the brian via the intercostal nerves.  Examples of such injuries include rib fractures and incisions for lung and gall bladder surgery.  Occasionally, pain may persist long after an injury or surgical incision secondary to inflammation and irritation of the intercostal nerve.  The longstanding pain is known as intercostal neuralgia.  An intercostal nerve block is preformed to eliminate pain either temporarily or permanently.  A small  needle is placed below the rib and local anesthetic (like Novocaine) and possibly steroid is injected.  Usually 2-4  intercostal nerves are blocked at a time depending on the problem.  The patient will experience a slight "pin-prick" sensation for each injection.  Shortly thereafter, the strip of skin that is innervated by the blocked intercostal nerve will feel numb.  Persistent pain that is only temporarily relieved with local anesthetic may require a more permanent block. This procedure is called Cryoneurolysis and entails placing a small probe beneath the rib to freeze the nerve.  Conditions that may be treated by intercostal nerve blocks:   Rib fractures  Longstanding pain from surgery of the chest or abdomen (intercostal neuralgia)  Pain from chest tubes  Pain from trauma to the chest  Preparation for the injections:  1. Do not eat any solid food or dairy products within 8 hours of your appointment. 2. You may drink clear liquids up to 3 hours before appointment.  Clear liquids include water, black coffee, juice or soda.  No milk or cream please. 3. You may take your regular medication, including pain medications, with a sip of water before your appointment.  Diabetics should hold regular insulin (if take separately) and take 1/2 normal NPH dose the morning of the procedure.   Carry some sugar containing items with you to your appointment. 4. A driver must accompany you and be prepared to drive you home after your procedure. 5. Bring all your current medications with you. 6. An IV may be inserted and sedation may be given at the discretion of the physician. 7. A blood pressure cuff, EKG and other monitors will often be applied during the procedure.  Some patients may need to have extra oxygen administered for a short period. 8. You will be asked to provide medical information, including your allergies, prior to the procedure.  We must know immediately if you are taking blood thinners (like  Coumadin/Warfarin) or if you are allergic to IV iodine contrast (dye). We must know if you could possible be pregnant.  Possible side-effects:   Bleeding from needle site  Infection (rare)  Nerve injury (rare)  Numbness & tingling of skin  Collapsed lung requiring chest tube (rare)  Local anesthetic toxicity (rare)  Light-headedness (temporary)  Pain at injection site (several days)  Decreased blood pressure (temporary)  Shortness of breath  Jittery/shaking sensation (temporary)  Call if you experience:   Difficulty breathing or hives (go directly to the emergency room)  Redness, inflammation or drainage at the injection site  Severe pain at the site of the injection  Any new symptoms which are concerning   Please note:  Your pain may subside immediately but may return several hours after the injection.  Often, more than one injection is required to reduce the pain. Also, if several temporary blocks with local anesthetic are ineffective, a more permanent block with cryolysis may be necessary.  This will be discussed with you should this be the case.  If you have any questions, please call [phone] [place]  What are the risk, side effects and possible complications? Generally speaking, most procedures are safe.  However, with any procedure there are risks, side effects, and the possibility of complications.  The risks and complications are dependent upon the sites that are lesioned, or the type of nerve block to be performed.  The closer the procedure is to the spine, the more serious the risks are.  Great care is taken when placing the radio frequency needles, block needles or lesioning  probes, but sometimes complications can occur. 1. Infection: Any time there is an injection through the skin, there is a risk of infection.  This is why sterile conditions are used for these blocks.  There  are four possible types of infection. 1. Localized skin infection. 2. Central Nervous System Infection-This can be in the form of Meningitis, which can be deadly. 3. Epidural Infections-This can be in the form of an epidural abscess, which can cause pressure inside of the spine, causing compression of the spinal cord with subsequent paralysis. This would require an emergency surgery to decompress, and there are no guarantees that the patient would recover from the paralysis. 4. Discitis-This is an infection of the intervertebral discs.  It occurs in about 1% of discography procedures.  It is difficult to treat and it may lead to surgery.        2. Pain: the needles have to go through skin and soft tissues, will cause soreness.       3. Damage to internal structures:  The nerves to be lesioned may be near blood vessels or    other nerves which can be potentially damaged.       4. Bleeding: Bleeding is more common if the patient is taking blood thinners such as  aspirin, Coumadin, Ticiid, Plavix, etc., or if he/she have some genetic predisposition  such as hemophilia. Bleeding into the spinal canal can cause compression of the spinal  cord with subsequent paralysis.  This would require an emergency surgery to  decompress and there are no guarantees that the patient would recover from the  paralysis.       5. Pneumothorax:  Puncturing of a lung is a possibility, every time a needle is introduced in  the area of the chest or upper back.  Pneumothorax refers to free air around the  collapsed lung(s), inside of the thoracic cavity (chest cavity).  Another two possible  complications related to a similar event would include: Hemothorax and Chylothorax.   These are variations of the Pneumothorax, where instead of air around the collapsed  lung(s), you may have blood or chyle, respectively.       6. Spinal headaches: They may occur with any procedures in the area of the spine.       7. Persistent CSF  (Cerebro-Spinal Fluid) leakage: This is a rare problem, but may occur  with prolonged intrathecal or epidural catheters either due to the formation of a fistulous  track or a dural tear.       8. Nerve damage: By working so close to the spinal cord, there is always a possibility of  nerve damage, which could be as serious as a permanent spinal cord injury with  paralysis.       9. Death:  Although rare, severe deadly allergic reactions known as "Anaphylactic  reaction" can occur to any of the medications used.      10. Worsening of the symptoms:  We can always make thing worse.  What are the chances of something like this happening? Chances of any of this occuring are extremely low.  By statistics, you have more of a chance of getting killed in a motor vehicle accident: while driving to the hospital than any of the above occurring .  Nevertheless, you should be aware that they are possibilities.  In general, it is similar to taking a shower.  Everybody knows that you can slip, hit your head and get killed.  Does that mean that  you should not shower again?  Nevertheless always keep in mind that statistics do not mean anything if you happen to be on the wrong side of them.  Even if a procedure has a 1 (one) in a 1,000,000 (million) chance of going wrong, it you happen to be that one..Also, keep in mind that by statistics, you have more of a chance of having something go wrong when taking medications.  Who should not have this procedure? If you are on a blood thinning medication (e.g. Coumadin, Plavix, see list of "Blood Thinners"), or if you have an active infection going on, you should not have the procedure.  If you are taking any blood thinners, please inform your physician.  How should I prepare for this procedure?  Do not eat or drink anything at least six hours prior to the procedure.  Bring a driver with you .  It cannot be a taxi.  Come accompanied by an adult that can drive you back, and that  is strong enough to help you if your legs get weak or numb from the local anesthetic.  Take all of your medicines the morning of the procedure with just enough water to swallow them.  If you have diabetes, make sure that you are scheduled to have your procedure done first thing in the morning, whenever possible.  If you have diabetes, take only half of your insulin dose and notify our nurse that you have done so as soon as you arrive at the clinic.  If you are diabetic, but only take blood sugar pills (oral hypoglycemic), then do not take them on the morning of your procedure.  You may take them after you have had the procedure.  Do not take aspirin or any aspirin-containing medications, at least eleven (11) days prior to the procedure.  They may prolong bleeding.  Wear loose fitting clothing that may be easy to take off and that you would not mind if it got stained with Betadine or blood.  Do not wear any jewelry or perfume  Remove any nail coloring.  It will interfere with some of our monitoring equipment.  NOTE: Remember that this is not meant to be interpreted as a complete list of all possible complications.  Unforeseen problems may occur.  BLOOD THINNERS The following drugs contain aspirin or other products, which can cause increased bleeding during surgery and should not be taken for 2 weeks prior to and 1 week after surgery.  If you should need take something for relief of minor pain, you may take acetaminophen which is found in Tylenol,m Datril, Anacin-3 and Panadol. It is not blood thinner. The products listed below are.  Do not take any of the products listed below in addition to any listed on your instruction sheet.  A.P.C or A.P.C with Codeine Codeine Phosphate Capsules #3 Ibuprofen Ridaura  ABC compound Congesprin Imuran rimadil  Advil Cope Indocin Robaxisal  Alka-Seltzer Effervescent Pain Reliever and Antacid Coricidin or Coricidin-D  Indomethacin Rufen  Alka-Seltzer plus  Cold Medicine Cosprin Ketoprofen S-A-C Tablets  Anacin Analgesic Tablets or Capsules Coumadin Korlgesic Salflex  Anacin Extra Strength Analgesic tablets or capsules CP-2 Tablets Lanoril Salicylate  Anaprox Cuprimine Capsules Levenox Salocol  Anexsia-D Dalteparin Magan Salsalate  Anodynos Darvon compound Magnesium Salicylate Sine-off  Ansaid Dasin Capsules Magsal Sodium Salicylate  Anturane Depen Capsules Marnal Soma  APF Arthritis pain formula Dewitt's Pills Measurin Stanback  Argesic Dia-Gesic Meclofenamic Sulfinpyrazone  Arthritis Bayer Timed Release Aspirin Diclofenac Meclomen Sulindac  Arthritis  pain formula Anacin Dicumarol Medipren Supac  Analgesic (Safety coated) Arthralgen Diffunasal Mefanamic Suprofen  Arthritis Strength Bufferin Dihydrocodeine Mepro Compound Suprol  Arthropan liquid Dopirydamole Methcarbomol with Aspirin Synalgos  ASA tablets/Enseals Disalcid Micrainin Tagament  Ascriptin Doan's Midol Talwin  Ascriptin A/D Dolene Mobidin Tanderil  Ascriptin Extra Strength Dolobid Moblgesic Ticlid  Ascriptin with Codeine Doloprin or Doloprin with Codeine Momentum Tolectin  Asperbuf Duoprin Mono-gesic Trendar  Aspergum Duradyne Motrin or Motrin IB Triminicin  Aspirin plain, buffered or enteric coated Durasal Myochrisine Trigesic  Aspirin Suppositories Easprin Nalfon Trillsate  Aspirin with Codeine Ecotrin Regular or Extra Strength Naprosyn Uracel  Atromid-S Efficin Naproxen Ursinus  Auranofin Capsules Elmiron Neocylate Vanquish  Axotal Emagrin Norgesic Verin  Azathioprine Empirin or Empirin with Codeine Normiflo Vitamin E  Azolid Emprazil Nuprin Voltaren  Bayer Aspirin plain, buffered or children's or timed BC Tablets or powders Encaprin Orgaran Warfarin Sodium  Buff-a-Comp Enoxaparin Orudis Zorpin  Buff-a-Comp with Codeine Equegesic Os-Cal-Gesic   Buffaprin Excedrin plain, buffered or Extra Strength Oxalid   Bufferin Arthritis Strength Feldene Oxphenbutazone   Bufferin  plain or Extra Strength Feldene Capsules Oxycodone with Aspirin   Bufferin with Codeine Fenoprofen Fenoprofen Pabalate or Pabalate-SF   Buffets II Flogesic Panagesic   Buffinol plain or Extra Strength Florinal or Florinal with Codeine Panwarfarin   Buf-Tabs Flurbiprofen Penicillamine   Butalbital Compound Four-way cold tablets Penicillin   Butazolidin Fragmin Pepto-Bismol   Carbenicillin Geminisyn Percodan   Carna Arthritis Reliever Geopen Persantine   Carprofen Gold's salt Persistin   Chloramphenicol Goody's Phenylbutazone   Chloromycetin Haltrain Piroxlcam   Clmetidine heparin Plaquenil   Cllnoril Hyco-pap Ponstel   Clofibrate Hydroxy chloroquine Propoxyphen         Before stopping any of these medications, be sure to consult the physician who ordered them.  Some, such as Coumadin (Warfarin) are ordered to prevent or treat serious conditions such as "deep thrombosis", "pumonary embolisms", and other heart problems.  The amount of time that you may need off of the medication may also vary with the medication and the reason for which you were taking it.  If you are taking any of these medications, please make sure you notify your pain physician before you undergo any procedures.

## 2017-03-04 ENCOUNTER — Telehealth: Payer: Self-pay | Admitting: *Deleted

## 2017-03-04 NOTE — Telephone Encounter (Signed)
No problems post procedure. 

## 2017-03-06 ENCOUNTER — Ambulatory Visit: Payer: Medicare Other | Admitting: Nurse Practitioner

## 2017-03-11 ENCOUNTER — Telehealth: Payer: Self-pay | Admitting: Pain Medicine

## 2017-03-11 NOTE — Telephone Encounter (Signed)
Dr. Dorna Leitz called asking to speak with Dr. Dossie Arbour about Marlys having bilateral knee injections at his office. Would like to speak with you today, if not he will go ahead with prior auth for procedure. Please call him at [phone]

## 2017-03-11 NOTE — Telephone Encounter (Signed)
Called and spoke with Delfino Lovett, patient's husband to get additional information.  Richard states that Dr Berenice Primas is an orthopedic surgeon at [facility] orthopedics.  The concern is that these knee injections will be a series of injections and they would be driving from the other side of [place] if they were to have this performed here.  This physician is closer for them and is aware of the patient relationship with Dr Dossie Arbour which is the reason he is calling to speak to Dr Dossie Arbour.

## 2017-03-24 ENCOUNTER — Ambulatory Visit: Payer: Self-pay | Admitting: Pain Medicine

## 2017-03-25 ENCOUNTER — Ambulatory Visit: Payer: Self-pay | Admitting: Psychology

## 2017-03-26 ENCOUNTER — Ambulatory Visit: Payer: Self-pay | Admitting: Pain Medicine

## 2017-04-02 ENCOUNTER — Ambulatory Visit: Payer: Medicare Other | Attending: Pain Medicine | Admitting: Pain Medicine

## 2017-04-02 ENCOUNTER — Encounter: Payer: Self-pay | Admitting: Pain Medicine

## 2017-04-02 VITALS — BP 109/86 | HR 76 | Temp 97.7°F | Resp 16 | Ht 66.0 in | Wt 190.0 lb

## 2017-04-02 DIAGNOSIS — Z79899 Other long term (current) drug therapy: Secondary | ICD-10-CM | POA: Diagnosis not present

## 2017-04-02 DIAGNOSIS — R253 Fasciculation: Secondary | ICD-10-CM | POA: Diagnosis not present

## 2017-04-02 DIAGNOSIS — Z90710 Acquired absence of both cervix and uterus: Secondary | ICD-10-CM | POA: Diagnosis not present

## 2017-04-02 DIAGNOSIS — I10 Essential (primary) hypertension: Secondary | ICD-10-CM | POA: Diagnosis not present

## 2017-04-02 DIAGNOSIS — Z8781 Personal history of (healed) traumatic fracture: Secondary | ICD-10-CM

## 2017-04-02 DIAGNOSIS — Z8249 Family history of ischemic heart disease and other diseases of the circulatory system: Secondary | ICD-10-CM | POA: Diagnosis not present

## 2017-04-02 DIAGNOSIS — R1012 Left upper quadrant pain: Secondary | ICD-10-CM | POA: Diagnosis not present

## 2017-04-02 DIAGNOSIS — F329 Major depressive disorder, single episode, unspecified: Secondary | ICD-10-CM | POA: Diagnosis not present

## 2017-04-02 DIAGNOSIS — M792 Neuralgia and neuritis, unspecified: Secondary | ICD-10-CM

## 2017-04-02 DIAGNOSIS — F419 Anxiety disorder, unspecified: Secondary | ICD-10-CM | POA: Insufficient documentation

## 2017-04-02 DIAGNOSIS — R1011 Right upper quadrant pain: Secondary | ICD-10-CM | POA: Diagnosis not present

## 2017-04-02 DIAGNOSIS — K449 Diaphragmatic hernia without obstruction or gangrene: Secondary | ICD-10-CM | POA: Diagnosis not present

## 2017-04-02 DIAGNOSIS — Z87442 Personal history of urinary calculi: Secondary | ICD-10-CM | POA: Diagnosis not present

## 2017-04-02 DIAGNOSIS — K219 Gastro-esophageal reflux disease without esophagitis: Secondary | ICD-10-CM | POA: Insufficient documentation

## 2017-04-02 DIAGNOSIS — Z9889 Other specified postprocedural states: Secondary | ICD-10-CM | POA: Diagnosis not present

## 2017-04-02 DIAGNOSIS — Z9049 Acquired absence of other specified parts of digestive tract: Secondary | ICD-10-CM | POA: Diagnosis not present

## 2017-04-02 DIAGNOSIS — Z8489 Family history of other specified conditions: Secondary | ICD-10-CM | POA: Diagnosis not present

## 2017-04-02 DIAGNOSIS — G8929 Other chronic pain: Secondary | ICD-10-CM | POA: Insufficient documentation

## 2017-04-02 DIAGNOSIS — R109 Unspecified abdominal pain: Secondary | ICD-10-CM | POA: Diagnosis not present

## 2017-04-02 NOTE — Progress Notes (Deleted)
Safety precautions to be maintained throughout the outpatient stay will include: orient to surroundings, keep bed in low position, maintain call bell within reach at all times, provide assistance with transfer out of bed and ambulation. Had 50% relief overall from procedure on March 03, 2017.

## 2017-04-02 NOTE — Progress Notes (Signed)
Nursing Pain Medication Assessment:  Safety precautions to be maintained throughout the outpatient stay will include: orient to surroundings, keep bed in low position, maintain call bell within reach at all times, provide assistance with transfer out of bed and ambulation.  Medication Inspection Compliance: Pill count conducted under aseptic conditions, in front of the patient. Neither the pills nor the bottle was removed from the patient's sight at any time. Once count was completed pills were immediately returned to the patient in their original bottle.  Medication: Morphine ER (MSContin) Pill/Patch Count: 55 of 60 pills remain Pill/Patch Appearance: Markings consistent with prescribed medication Bottle Appearance: Standard pharmacy container. Clearly labeled. Filled Date: 08 / 20 / 2018 Last Medication intake:  Today

## 2017-04-02 NOTE — Progress Notes (Signed)
Patient's Name: Kari Gibbs  MRN: [id number]  Referring Provider: Cari Caraway, MD  DOB: [birth year]  PCP: Cari Caraway, MD  DOS: 04/02/2017  Note by: Gaspar Cola, MD  Service setting: Ambulatory outpatient  Specialty: Interventional Pain Management  Location: [facility] (AMB) Pain Management Facility    Patient type: Established   Primary Reason(s) for Visit: Evaluation of chronic illnesses with exacerbation, or progression (Level of risk: moderate) CC: Chest Pain (left side under breast)  HPI  Kari Gibbs is a 66 y.o. year old, female patient, who comes today for a follow-up evaluation. 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; S/P laparoscopic cholecystectomy Oct 2014; Fracture of rib of left side; Long term current use of opiate analgesic; Long term prescription opiate use; Opiate use (60 MME/day); Encounter for therapeutic drug level monitoring; Opiate dependence ([place]); Encounter for chronic pain management; Chronic pain syndrome; Chronic flank pain (Location of Primary Source of Pain) (Left); Chronic thoracic radicular pain (Left-sided); Opioid-induced constipation (OIC); Chronic occipital neuralgia (Left); Neuropathic pain; Neurogenic pain; History of rib fracture (Left 4th and 8th rib); Vertebral body hemangioma (T11); Metatarsalgia of right foot; Chronic abdominal pain (Bilateral) (L>R) (possible left-sided thoracic radiculopathy); Osteoarthritis; Return to work evaluation (FCE: Medium); Varicose veins of bilateral lower extremities with other complications; Varicose veins of right lower extremity with complications; Chronic neck pain (Bilateral) (L>R); Cervical facet syndrome (Bilateral) (L>R); Muscle spasticity; Tremors of nervous system; Muscle twitching; Cervical spondylosis; Chronic bilateral low back pain without sciatica; Acute costochondritis (Left); and Morbid  obesity ([place]) on her problem list. Kari Gibbs was last seen on 03/11/2017. Her primarily concern today is the Chest Pain (left side under breast)  Pain Assessment: Location: Left Rib cage Radiating: goes to the back Onset: More than a month ago Duration: Chronic pain Quality: Aching, Constant, Sharp, Radiating, Dull Severity: 4 /10 (self-reported pain score)  Note: Reported level is compatible with observation.                   Effect on ADL: pace self Timing: Constant Modifying factors: medicine  helps; procedure  Time Note: Greater than 50% of the 40 minute(s) of face-to-face time spent with Kari Gibbs, was spent in counseling/coordination of care regarding: The results of the recent gabapentin trial, "Drug Holidays", the treatment plan, treatment alternatives, the risks and possible complications of proposed treatment, medication side effects, the opioid analgesic risks and possible complications and the appropriate use of her medications. Unfortunately, the patient did not get any benefit from the gabapentin and in fact she was very somnolent and unsteady on her feet. Because of this we have decided to taper it down and discontinue the use of the medication. I have explained to them the proper way of doing this so as to avoid any side effects. She continues to have twitching. According to than neurological evaluation the patient does not seem to have any particular criteria that would explain those symptoms however, it is entirely possible that this may be secondary to the morphine. If by any chance she is metabolizing the morphine primarily through local glucuronidisation, she may be producing more of the neurotoxic metabolites that may lead to this type of side effect. Because of this we have offered her the possibility of doing a drug holiday to determine if by decreasing the dose of the morphine those symptoms go away or improve.  Further details on both, my assessment(s), as  well as  the proposed treatment plan, please see below.  Laboratory Chemistry  Inflammation Markers (CRP: Acute Phase) (ESR: Chronic Phase) Lab Results  Component Value Date   CRP <0.5 01/04/2016   ESRSEDRATE 17 01/04/2016                 Renal Function Markers Lab Results  Component Value Date   BUN 20 01/04/2016   CREATININE 0.76 01/04/2016   GFRAA >60 01/04/2016   GFRNONAA >60 01/04/2016                 Hepatic Function Markers Lab Results  Component Value Date   AST 33 01/04/2016   ALT 26 01/04/2016   ALBUMIN 4.2 01/04/2016   ALKPHOS 97 01/04/2016                 Electrolytes Lab Results  Component Value Date   NA 139 01/04/2016   K 4.6 01/04/2016   CL 99 (L) 01/04/2016   CALCIUM 9.7 01/04/2016   MG 2.1 01/04/2016                 Neuropathy Markers Lab Results  Component Value Date   VITAMINB12 321 01/04/2016                 Bone Pathology Markers Lab Results  Component Value Date   ALKPHOS 97 01/04/2016   25OHVITD1 49 01/04/2016   25OHVITD2 25 01/04/2016   25OHVITD3 24 01/04/2016   CALCIUM 9.7 01/04/2016                 Coagulation Parameters Lab Results  Component Value Date   PLT 238 11/18/2014                 Cardiovascular Markers Lab Results  Component Value Date   HGB 11.1 (L) 11/18/2014   HCT 34.1 (L) 11/18/2014                 Note: Lab results reviewed.  Recent Diagnostic Imaging Review  Dg C-arm 1-60 Min-no Report Result Date: 03/03/2017 Fluoroscopy was utilized by the requesting physician.  No radiographic interpretation.   Cervical Imaging: Cervical DG complete:  Results for orders placed during the hospital encounter of 03/27/16  DG Cervical Spine Complete   Narrative CLINICAL DATA:  Popping noise along the left side of the neck with head rotation. No known injury.  EXAM: CERVICAL SPINE - COMPLETE 4+ VIEW  COMPARISON:  None.  FINDINGS: No fracture.  No spondylolisthesis.  Moderate loss disc height at C4-C5 through  C6-C7. Small endplate spurs are noted at these levels. There is no significant neural foraminal narrowing.  Bones are demineralized.  Soft tissues are unremarkable.  IMPRESSION: 1. No fracture, spondylolisthesis or acute finding. 2. 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   Electronically Signed   By: Lajean Manes M.D.   On: 03/27/2016 12:54    Thoracic Imaging: Thoracic MR wo contrast:  Results for orders placed during the hospital encounter of 08/26/13  MR Thoracic Spine Wo Contrast   Addendum ADDENDUM REPORT: 09/11/2013 11:20  ADDENDUM: The original report was by Dr. Ivar Drape. The following addendum is by Dr. Van Clines:  I received a phone call from Dr. Theadore Nan at 11:10 a.m. on 09/11/2013 regarding this patient who has left lower chest pain localizing to left lateral/anterolateral rib pain.  Reviewing the images in comparison to the exam from 01/26/2013 in light of this specific localization, there is a subtle band of  sclerosis in the left anterolateral eighth rib on images 11 through 14 of series 3. This is not apparent on the June exam and in light of the patient's point tenderness at this location, is most compatible with a healing nondisplaced fracture. I have surveyed the rest of the skeleton on that exam and do not observe other fractures or significant abnormalities in this vicinity.   Electronically Signed   By: Sherryl Barters M.D.   On: 09/11/2013 11:20       Sherryl Barters, MD 09/11/2013 11:22 AM         Narrative CLINICAL DATA:  Rib pain.  Left flank pain.  EXAM: MRI THORACIC SPINE WITHOUT CONTRAST  TECHNIQUE: Multiplanar, multisequence MR imaging was performed. No intravenous contrast was administered.  COMPARISON:  None.  FINDINGS: Negative for thoracic fracture or mass lesion. Hemangioma T11 vertebral body. Spinal cord signal is normal. No syrinx or mass in the cord.  Negative for spinal stenosis. No cord compression. Disc spaces  are maintained and there is no significant degenerative change. Negative for disc protrusion.  IMPRESSION: Negative  Electronically Signed: By: Franchot Gallo M.D. On: 08/26/2013 20:14    Lumbosacral Imaging: Lumbar DG Bending views:  Results for orders placed during the hospital encounter of 09/12/16  DG Lumbar Spine Complete W/Bend   Narrative CLINICAL DATA:  Osteoarthritis.  EXAM: LUMBAR SPINE - COMPLETE WITH BENDING VIEWS  COMPARISON:  06/28/2016.  FINDINGS: Degenerative changes lumbar spine. No evidence of fracture or dislocation. Diffuse osteopenia. Surgical clips noted over the abdomen and pelvis. Prominent amount of stool throughout the colon. Constipation cannot be excluded .  IMPRESSION: Diffuse degenerative changes lumbar spine. No acute abnormality identified.   Electronically Signed   By: Marcello Moores  Register   On: 09/12/2016 13:38    Note: Results of ordered imaging test(s) reviewed and explained to patient in Layman's terms. Copy of results provided to patient  Meds   Current Meds  Medication Sig  . busPIRone (BUSPAR) 15 MG tablet TAKE 1/3 TO 1/2 TAB AS NEEDED FOR ANXIETY UP TO TWICE A DAY  . cholecalciferol (VITAMIN D) 1000 units tablet Take 5,000 Units by mouth 3 (three) times a week.   . conjugated estrogens (PREMARIN) vaginal cream Premarin 0.625 mg/gram vaginal cream  INSERT 1GRAM VAGINALLY 3 TIMES WEEKLY AT BEDTIME  . gabapentin (NEURONTIN) 300 MG capsule Take 3 capsules (900 mg total) by mouth 2 (two) times daily. May go up to 3 capsules 4 times a day. Titrate up slowly.  . Magnesium 500 MG TABS Take 1 tablet by mouth 2 (two) times daily.  . metroNIDAZOLE (METROCREAM) 0.75 % cream APPLY ON THE SKIN AS DIRECTED. APPLY TO AFFECTED AREAS DAILY AS NEEDED.  Derrill Memo ON 04/30/2017] morphine (MS CONTIN) 30 MG 12 hr tablet Take 1 tablet (30 mg total) by mouth every 12 (twelve) hours.  Derrill Memo ON 05/30/2017] morphine (MS CONTIN) 30 MG 12 hr tablet Take 1  tablet (30 mg total) by mouth every 12 (twelve) hours.  Marland Kitchen morphine (MS CONTIN) 30 MG 12 hr tablet Take 1 tablet (30 mg total) by mouth every 12 (twelve) hours.  . naloxone (NARCAN) 2 MG/2ML injection Inject content of syringe into thigh muscle. Call 911.  . omeprazole (PRILOSEC) 20 MG capsule Take 20 mg by mouth 2 (two) times daily.  . potassium gluconate 595 MG TABS tablet Take 595 mg by mouth daily as needed (low potassium).  Marland Kitchen PREMARIN vaginal cream INSERT 1GRAM VAGINALLY 3 TIMES WEEKLY AT BEDTIME  .  PRENATAL VIT-FE FUM-FA-OMEGA PO Take 1 tablet by mouth daily.  . Probiotic Product (ALIGN) 4 MG CAPS Take 1 capsule by mouth at bedtime. Per Pt.  . valsartan (DIOVAN) 40 MG tablet Take 40 mg by mouth every morning.  . venlafaxine XR (EFFEXOR-XR) 75 MG 24 hr capsule Take 225 mg by mouth every morning.  . vitamin B-12 (CYANOCOBALAMIN) 500 MCG tablet Take 500 mcg by mouth 3 (three) times a week.  . Wheat Dextrin (BENEFIBER) POWD Stir 2 teaspoons of Benefiber into 4-8 oz of any non-carbonated beverage or soft food (hot or cold) TID.    ROS  Constitutional: Denies any fever or chills Gastrointestinal: No reported hemesis, hematochezia, vomiting, or acute GI distress Musculoskeletal: Denies any acute onset joint swelling, redness, loss of ROM, or weakness Neurological: No reported episodes of acute onset apraxia, aphasia, dysarthria, agnosia, amnesia, paralysis, loss of coordination, or loss of consciousness  Allergies  Kari Gibbs is allergic to iohexol; ace inhibitors; losartan potassium; and neosporin [neomycin-bacitracin zn-polymyx].  [place]  Drug: Kari Gibbs  reports that she does not use drugs. Alcohol:  reports that she does not drink alcohol. Tobacco:  reports that she has never smoked. She has never used smokeless tobacco. 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 Family: family history includes Alzheimer's disease in her father; Hypertension in her mother.  Constitutional Exam  General appearance: Well nourished, well developed, and well hydrated. In no apparent acute distress Vitals:   04/02/17 1351  BP: 109/86  Pulse: 76  Resp: 16  Temp: 97.7 F (36.5 C)  SpO2: 96%  Weight: 190 lb (86.2 kg)  Height: '5\' 6"'$  (1.676 m)   BMI Assessment: Estimated body mass index is 30.67 kg/m as calculated from the following:   Height as of this encounter: '5\' 6"'$  (1.676 m).   Weight as of this encounter: 190 lb (86.2 kg).  BMI interpretation table: BMI level Category Range association with higher incidence of chronic pain  <18 kg/m2 Underweight   18.5-24.9 kg/m2 Ideal body weight   25-29.9 kg/m2 Overweight Increased incidence by 20%  30-34.9 kg/m2 Obese (Class I) Increased incidence by 68%  35-39.9 kg/m2 Severe obesity (Class II) Increased incidence by 136%  >40 kg/m2 Extreme obesity (Class III) Increased incidence by 254%   BMI Readings from Last 4 Encounters:  04/02/17 30.67 kg/m  03/03/17 30.67 kg/m  01/21/17 30.67 kg/m  12/11/16 29.86 kg/m   Wt Readings from Last 4 Encounters:  04/02/17 190 lb (86.2 kg)  03/03/17 190 lb (86.2 kg)  01/21/17 190 lb (86.2 kg)  12/11/16 185 lb (83.9 kg)  Psych/Mental status: Alert, oriented x 3  (person, place, & time)       Eyes: PERLA Respiratory: No evidence of acute respiratory distress  Gait & Posture Assessment  Ambulation: Unassisted, but somewhat unstable. This is likely to be medication induced. She is currently being assisted by her husband. Gait: Dystaxia (Mild degree of ataxia) Posture:  Antalgic   Assessment  Primary Diagnosis & Pertinent Problem List: The primary encounter diagnosis was Chronic flank pain (Location of Primary Source of Pain) (Left). Diagnoses of Chronic abdominal pain (Bilateral) (L>R) (possible left-sided thoracic radiculopathy), History of rib fracture (Left 4th and 8th rib), Muscle twitching, and Neuropathic pain were also pertinent to this visit.  Status Diagnosis  Controlled Controlled Controlled 1. Chronic flank pain (Location of Primary Source of Pain) (Left)   2. Chronic abdominal pain (Bilateral) (L>R) (possible left-sided thoracic radiculopathy)   3. History of rib fracture (Left 4th and 8th rib)   4. Muscle twitching   5. Neuropathic pain     Problems updated and reviewed during this visit: No problems updated. Plan of Care  Pharmacotherapy (Medications Ordered): No orders of the defined types were placed in this encounter.  New Prescriptions   No medications on file   Medications administered today: Kari Gibbs had no medications administered during this visit.  Procedure Orders    No procedure(s) ordered today   Lab Orders  No laboratory test(s) ordered today   Imaging Orders  No imaging studies ordered today   Referral Orders  No referral(s) requested today    Interventional management options: Planned, scheduled, and/or pending:   Not at this time.   Considering:   Diagnostic left costochondral joint injection Palliative left intercostal nerve blocks (T5 to T8) Possible left intercostal nerve RFA Palliative left greater occipital nerve block Therapeutic Left greater occipital nerve RFA  Diagnostic left C2  + TON nerve block  Possible left C2 + TON RFA    Palliative PRN treatment(s):   Diagnostic left costochondral joint injection Palliative left intercostal nerve blocks (T5 to T8) Possible left intercostal nerve RFA Palliative left greater occipital nerve block Therapeutic Left greater occipital nerve RFA  Diagnostic left C2 + TON nerve block  Possible left C2 + TON RFA    Provider-requested follow-up: No Follow-up on file.  Future Appointments Date Time Provider [place]  04/03/2017 1:00 PM Kirtland Bouchard, PhD [facility] None  06/26/2017 9:00 AM Vevelyn Francois, NP [facility] None   Primary Care Physician: Cari Caraway, MD Location: [facility] Note by: Gaspar Cola, MD Date: 04/02/2017; Time: 2:51 PM

## 2017-04-02 NOTE — Patient Instructions (Signed)

## 2017-04-03 ENCOUNTER — Ambulatory Visit: Payer: 59 | Admitting: Psychology

## 2017-06-12 ENCOUNTER — Telehealth: Payer: Self-pay

## 2017-06-12 NOTE — Telephone Encounter (Signed)
called patient back to get Surgeon's information and will fax over surgical pain agreement.  Alta Corning  [phone] Fax [phone]  Surgical pain management agreement faxed over today 06/12/17 @ 1403

## 2017-06-12 NOTE — Telephone Encounter (Signed)
Called patient to let her know this has been taken care of.

## 2017-06-16 HISTORY — PX: SHOULDER SURGERY: SHX246

## 2017-06-26 ENCOUNTER — Encounter: Payer: Self-pay | Admitting: Nurse Practitioner

## 2017-06-26 ENCOUNTER — Other Ambulatory Visit: Payer: Self-pay

## 2017-06-26 ENCOUNTER — Ambulatory Visit: Payer: Medicare Other | Attending: Nurse Practitioner | Admitting: Nurse Practitioner

## 2017-06-26 VITALS — BP 149/71 | HR 78 | Temp 98.3°F | Resp 16 | Ht 66.0 in | Wt 205.0 lb

## 2017-06-26 DIAGNOSIS — M5414 Radiculopathy, thoracic region: Secondary | ICD-10-CM | POA: Diagnosis not present

## 2017-06-26 DIAGNOSIS — R109 Unspecified abdominal pain: Secondary | ICD-10-CM | POA: Insufficient documentation

## 2017-06-26 DIAGNOSIS — K219 Gastro-esophageal reflux disease without esophagitis: Secondary | ICD-10-CM | POA: Insufficient documentation

## 2017-06-26 DIAGNOSIS — G894 Chronic pain syndrome: Secondary | ICD-10-CM | POA: Insufficient documentation

## 2017-06-26 DIAGNOSIS — Z6833 Body mass index (BMI) 33.0-33.9, adult: Secondary | ICD-10-CM | POA: Insufficient documentation

## 2017-06-26 DIAGNOSIS — G8929 Other chronic pain: Secondary | ICD-10-CM | POA: Diagnosis not present

## 2017-06-26 DIAGNOSIS — M542 Cervicalgia: Secondary | ICD-10-CM | POA: Diagnosis not present

## 2017-06-26 DIAGNOSIS — I10 Essential (primary) hypertension: Secondary | ICD-10-CM | POA: Insufficient documentation

## 2017-06-26 DIAGNOSIS — M792 Neuralgia and neuritis, unspecified: Secondary | ICD-10-CM | POA: Diagnosis not present

## 2017-06-26 DIAGNOSIS — M545 Low back pain, unspecified: Secondary | ICD-10-CM

## 2017-06-26 DIAGNOSIS — Z5181 Encounter for therapeutic drug level monitoring: Secondary | ICD-10-CM | POA: Insufficient documentation

## 2017-06-26 DIAGNOSIS — F329 Major depressive disorder, single episode, unspecified: Secondary | ICD-10-CM | POA: Insufficient documentation

## 2017-06-26 DIAGNOSIS — Z79891 Long term (current) use of opiate analgesic: Secondary | ICD-10-CM | POA: Insufficient documentation

## 2017-06-26 MED ORDER — GABAPENTIN 300 MG PO CAPS: 900.0000 mg | ORAL_CAPSULE | Freq: Two times a day (BID) | ORAL | 2 refills | Status: DC

## 2017-06-26 MED ORDER — MORPHINE SULFATE ER 30 MG PO TBCR: 30.0000 mg | EXTENDED_RELEASE_TABLET | Freq: Two times a day (BID) | ORAL | 0 refills | Status: DC

## 2017-06-26 NOTE — Progress Notes (Signed)
Nursing Pain Medication Assessment:  Safety precautions to be maintained throughout the outpatient stay will include: orient to surroundings, keep bed in low position, maintain call bell within reach at all times, provide assistance with transfer out of bed and ambulation.  Medication Inspection Compliance: Pill count conducted under aseptic conditions, in front of the patient. Neither the pills nor the bottle was removed from the patient's sight at any time. Once count was completed pills were immediately returned to the patient in their original bottle.  Medication: Morphine ER (MSContin) Pill/Patch Count: 5 of 60 pills remain Pill/Patch Appearance: Markings consistent with prescribed medication Bottle Appearance: Standard pharmacy container. Clearly labeled. Filled Date: 10/19 / 2018 Last Medication intake:  Today   Given Percocet and Tizanidine for post op rotator cuff repair. Last dose 06-25-17.

## 2017-06-26 NOTE — Patient Instructions (Addendum)
____________________________________________________________________________________________  Medication Rules  Applies to: All patients receiving prescriptions (written or electronic).  Pharmacy of record: Pharmacy where electronic prescriptions will be sent. If written prescriptions are taken to a different pharmacy, please inform the nursing staff. The pharmacy listed in the electronic medical record should be the one where you would like electronic prescriptions to be sent.  Prescription refills: Only during scheduled appointments. Applies to both, written and electronic prescriptions.  NOTE: The following applies primarily to controlled substances (Opioid* Pain Medications).   Patient's responsibilities: 1. Pain Pills: Bring all pain pills to every appointment (except for procedure appointments). 2. Pill Bottles: Bring pills in original pharmacy bottle. Always bring newest bottle. Bring bottle, even if empty. 3. Medication refills: You are responsible for knowing and keeping track of what medications you need refilled. The day before your appointment, write a list of all prescriptions that need to be refilled. Bring that list to your appointment and give it to the admitting nurse. Prescriptions will be written only during appointments. If you forget a medication, it will not be "Called in", "Faxed", or "electronically sent". You will need to get another appointment to get these prescribed. 4. Prescription Accuracy: You are responsible for carefully inspecting your prescriptions before leaving our office. Have the discharge nurse carefully go over each prescription with you, before taking them home. Make sure that your name is accurately spelled, that your address is correct. Check the name and dose of your medication to make sure it is accurate. Check the number of pills, and the written instructions to make sure they are clear and accurate. Make sure that you are given enough medication to  last until your next medication refill appointment. 5. Taking Medication: Take medication as prescribed. Never take more pills than instructed. Never take medication more frequently than prescribed. Taking less pills or less frequently is permitted and encouraged, when it comes to controlled substances (written prescriptions).  6. Inform other Doctors: Always inform, all of your healthcare providers, of all the medications you take. 7. Pain Medication from other Providers: You are not allowed to accept any additional pain medication from any other Doctor or Healthcare provider. There are two exceptions to this rule. (see below) In the event that you require additional pain medication, you are responsible for notifying us, as stated below. 8. Medication Agreement: You are responsible for carefully reading and following our Medication Agreement. This must be signed before receiving any prescriptions from our practice. Safely store a copy of your signed Agreement. Violations to the Agreement will result in no further prescriptions. (Additional copies of our Medication Agreement are available upon request.) 9. Laws, Rules, & Regulations: All patients are expected to follow all Federal and State Laws, Statutes, Rules, & Regulations. Ignorance of the Laws does not constitute a valid excuse. The use of any illegal substances is prohibited. 10. Adopted CDC guidelines & recommendations: Target dosing levels will be at or below 60 MME/day. Use of benzodiazepines** is not recommended.  Exceptions: There are only two exceptions to the rule of not receiving pain medications from other Healthcare Providers. 1. Exception #1 (Emergencies): In the event of an emergency (i.e.: accident requiring emergency care), you are allowed to receive additional pain medication. However, you are responsible for: As soon as you are able, call our office [phone], at any time of the day or night, and leave a message stating your  name, the date and nature of the emergency, and the name and dose of the medication   prescribed. In the event that your call is answered by a member of our staff, make sure to document and save the date, time, and the name of the person that took your information.  2. Exception #2 (Planned Surgery): In the event that you are scheduled by another doctor or dentist to have any type of surgery or procedure, you are allowed (for a period no longer than 30 days), to receive additional pain medication, for the acute post-op pain. However, in this case, you are responsible for picking up a copy of our "Post-op Pain Management for Surgeons" handout, and giving it to your surgeon or dentist. This document is available at our office, and does not require an appointment to obtain it. Simply go to our office during business hours (Monday-Thursday from 8:00 AM to 4:00 PM) (Friday 8:00 AM to 12:00 Noon) or if you have a scheduled appointment with [place], prior to your surgery, and ask for it by name. In addition, you will need to provide [place] with your name, name of your surgeon, type of surgery, and date of procedure or surgery.  *Opioid medications include: morphine, codeine, oxycodone, oxymorphone, hydrocodone, hydromorphone, meperidine, tramadol, tapentadol, buprenorphine, fentanyl, methadone. **Benzodiazepine medications include: diazepam (Valium), alprazolam (Xanax), clonazepam (Klonopine), lorazepam (Ativan), clorazepate (Tranxene), chlordiazepoxide (Librium), estazolam (Prosom), oxazepam (Serax), temazepam (Restoril), triazolam (Halcion)  ____________________________________________________________________________________________   BMI Assessment: Estimated body mass index is 33.09 kg/m as calculated from the following:   Height as of this encounter: 5\' 6"  (1.676 m).   Weight as of this encounter: 205 lb (93 kg).  BMI interpretation table: BMI level Category Range association with higher incidence of chronic pain   <18 kg/m2 Underweight   18.5-24.9 kg/m2 Ideal body weight   25-29.9 kg/m2 Overweight Increased incidence by 20%  30-34.9 kg/m2 Obese (Class I) Increased incidence by 68%  35-39.9 kg/m2 Severe obesity (Class II) Increased incidence by 136%  >40 kg/m2 Extreme obesity (Class III) Increased incidence by 254%   BMI Readings from Last 4 Encounters:  06/26/17 33.09 kg/m  04/02/17 30.67 kg/m  03/03/17 30.67 kg/m  01/21/17 30.67 kg/m   Wt Readings from Last 4 Encounters:  06/26/17 205 lb (93 kg)  04/02/17 190 lb (86.2 kg)  03/03/17 190 lb (86.2 kg)  01/21/17 190 lb (86.2 kg)   A prescription for Gabapentin was sent to your pharmacy. You were given 3 prescriptions for Morphine today.

## 2017-06-26 NOTE — Progress Notes (Signed)
Patient's Name: Kari Gibbs  MRN: [id number]  Referring Provider: Cari Caraway, MD  DOB: 07-22-1951  PCP: Cari Caraway, MD  DOS: 06/26/2017  Note by: Vevelyn Francois NP  Service setting: Ambulatory outpatient  Specialty: Interventional Pain Management  Location: [facility] (AMB) Pain Management Facility    Patient type: Established    Primary Reason(s) for Visit: Encounter for prescription drug management. (Level of risk: moderate)  CC: Abdominal Pain (luq)  HPI  Kari Gibbs is a 66 y.o. year old, female patient, who comes today for a medication management evaluation. 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; S/P laparoscopic cholecystectomy Oct 2014; Fracture of rib of left side; Long term current use of opiate analgesic; Long term prescription opiate use; Opiate use (60 MME/day); Encounter for therapeutic drug level monitoring; Opiate dependence ([place]); Encounter for chronic pain management; Chronic pain syndrome; Chronic flank pain (Location of Primary Source of Pain) (Left); Chronic thoracic radicular pain (Left-sided); Opioid-induced constipation (OIC); Chronic occipital neuralgia (Left); Neuropathic pain; Neurogenic pain; History of rib fracture (Left 4th and 8th rib); Vertebral body hemangioma (T11); Metatarsalgia of right foot; Chronic abdominal pain (Bilateral) (L>R) (possible left-sided thoracic radiculopathy); Osteoarthritis; Return to work evaluation (FCE: Medium); Varicose veins of bilateral lower extremities with other complications; Varicose veins of right lower extremity with complications; Chronic neck pain (Bilateral) (L>R); Cervical facet syndrome (Bilateral) (L>R); Muscle spasticity; Tremors of nervous system; Muscle twitching; Cervical spondylosis; Chronic bilateral low back pain without sciatica; Acute costochondritis (Left); and Morbid obesity (HCC) on their  problem list. Her primarily concern today is the Abdominal Pain (luq)  Pain Assessment: Location: Left, Upper Abdomen Radiating: to the back Onset: More than a month ago Duration: Chronic pain Quality: Constant, Nagging Severity: 2 /10 (self-reported pain score)  Note: Reported level is compatible with observation.                          Effect on ADL:   Timing: Constant Modifying factors: lying on right side  Kari Gibbs was last scheduled for an appointment on Visit date not found for medication management. During today's appointment we reviewed Kari Gibbs's chronic pain status, as well as her outpatient medication regimen. She is in today with her spouse. She is SP right shoulder surgery. She is currently in PT and doing well. She was treated with Percocet post op. She denies any current concerns today. She denies any side effects of her medication .   The patient  reports that she does not use drugs. Her body mass index is 33.09 kg/m.  Further details on both, my assessment(s), as well as the proposed treatment plan, please see below.  Controlled Substance Pharmacotherapy Assessment REMS (Risk Evaluation and Mitigation Strategy)  Analgesic:MS Contin 30 mg every 12 hours (60 mg/day) MME/day:60 mg/day  Kari Martins, RN  06/26/2017  9:07 AM  Sign at close encounter Nursing Pain Medication Assessment:  Safety precautions to be maintained throughout the outpatient stay will include: orient to surroundings, keep bed in low position, maintain call bell within reach at all times, provide assistance with transfer out of bed and ambulation.  Medication Inspection Compliance: Pill count conducted under aseptic conditions, in front of the patient. Neither the pills nor the bottle was removed from the patient's sight at any time. Once count was completed pills were immediately returned to the patient in their original bottle.  Medication: Morphine ER (MSContin) Pill/Patch Count: 5  of 60 pills remain Pill/Patch Appearance: Markings consistent with prescribed medication Bottle Appearance: Standard pharmacy container. Clearly labeled. Filled Date: 10/19 / 2018 Last Medication intake:  Today   Given Percocet and Tizanidine for post op rotator cuff repair. Last dose 06-25-17.   Pharmacokinetics: Liberation and absorption (onset of action): WNL Distribution (time to peak effect): WNL Metabolism and excretion (duration of action): WNL         Pharmacodynamics: Desired effects: Analgesia: Kari Gibbs reports >50% benefit. Functional ability: Patient reports that medication allows her to accomplish basic ADLs Clinically meaningful improvement in function (CMIF): Sustained CMIF goals met Perceived effectiveness: Described as relatively effective, allowing for increase in activities of daily living (ADL) Undesirable effects: Side-effects or Adverse reactions: None reported Monitoring: [place] PMP: Online review of the past 12-month period conducted. Compliant with practice rules and regulations Last UDS on record: Summary  Date Value Ref Range Status  12/11/2016 FINAL  Final    Comment:    ==================================================================== TOXASSURE SELECT 13 (MW) ==================================================================== Test                             Result       Flag       Units Drug Present and Declared for Prescription Verification   Morphine                       >5155        EXPECTED   ng/mg creat    Potential sources of large amounts of morphine in the absence of    codeine include administration of morphine or use of heroin.   Hydromorphone                  207          EXPECTED   ng/mg creat    Hydromorphone may be present as a metabolite of morphine;    concentrations of hydromorphone rarely exceed 5% of the morphine    concentration when this is the source of  hydromorphone. ==================================================================== Test                      Result    Flag   Units      Ref Range   Creatinine              194              mg/dL      >=20 ==================================================================== Declared Medications:  The flagging and interpretation on this report are based on the  following declared medications.  Unexpected results may arise from  inaccuracies in the declared medications.  **Note: The testing scope of this panel includes these medications:  Morphine (MS Contin)  **Note: The testing scope of this panel does not include following  reported medications:  Buspirone (BuSpar)  Estrogen (Premarin)  Gabapentin  Iron  Magnesium  Metronidazole  Multivitamin (Prenatal Vit)  Naloxone (Narcan)  Omeprazole  Potassium  Supplement  Supplement (Probiotic)  Teriparatide  Triamcinolone (Kenalog)  Valsartan  Venlafaxine  Vitamin D ==================================================================== For clinical consultation, please call [phone]. ====================================================================    UDS interpretation: Compliant          Medication Assessment Form: Reviewed. Patient indicates being compliant with therapy Treatment compliance: Compliant Risk Assessment Profile: Aberrant behavior: See prior evaluations. None observed or detected today Comorbid factors increasing risk   of overdose: See prior notes. No additional risks detected today Risk of substance use disorder (SUD): Low Opioid Risk Tool - 06/26/17 0905      Family History of Substance Abuse   Alcohol  Negative    Illegal Drugs  Negative    Rx Drugs  Negative      Personal History of Substance Abuse   Alcohol  Negative    Illegal Drugs  Negative    Rx Drugs  Negative      Age   Age between 16-45 years   No      History of Preadolescent Sexual Abuse   History of Preadolescent Sexual Abuse   Negative or Female      Psychological Disease   Psychological Disease  Negative    Depression  Negative      Total Score   Opioid Risk Tool Scoring  0    Opioid Risk Interpretation  Low Risk      ORT Scoring interpretation table:  Score <3 = Low Risk for SUD  Score between 4-7 = Moderate Risk for SUD  Score >8 = High Risk for Opioid Abuse   Risk Mitigation Strategies:  Patient Counseling: Covered Patient-Prescriber Agreement (PPA): Present and active  Notification to other healthcare providers: Done  Pharmacologic Plan: No change in therapy, at this time  Laboratory Chemistry  Inflammation Markers (CRP: Acute Phase) (ESR: Chronic Phase) Lab Results  Component Value Date   CRP <0.5 01/04/2016   ESRSEDRATE 17 01/04/2016                 Renal Function Markers Lab Results  Component Value Date   BUN 20 01/04/2016   CREATININE 0.76 01/04/2016   GFRAA >60 01/04/2016   GFRNONAA >60 01/04/2016                 Hepatic Function Markers Lab Results  Component Value Date   AST 33 01/04/2016   ALT 26 01/04/2016   ALBUMIN 4.2 01/04/2016   ALKPHOS 97 01/04/2016                 Electrolytes Lab Results  Component Value Date   NA 139 01/04/2016   K 4.6 01/04/2016   CL 99 (L) 01/04/2016   CALCIUM 9.7 01/04/2016   MG 2.1 01/04/2016                 Neuropathy Markers Lab Results  Component Value Date   VITAMINB12 321 01/04/2016                 Bone Pathology Markers Lab Results  Component Value Date   ALKPHOS 97 01/04/2016   25OHVITD1 49 01/04/2016   25OHVITD2 25 01/04/2016   25OHVITD3 24 01/04/2016   CALCIUM 9.7 01/04/2016                 Rheumatology Markers No results found for: LABURIC, URICUR              Coagulation Parameters Lab Results  Component Value Date   PLT 238 11/18/2014   DDIMER  02/06/2010    <0.22        AT THE INHOUSE ESTABLISHED CUTOFF VALUE OF 0.48 ug/mL FEU, THIS ASSAY HAS BEEN DOCUMENTED IN THE LITERATURE TO HAVE A SENSITIVITY  AND NEGATIVE PREDICTIVE VALUE OF AT LEAST 98 TO 99%.  THE TEST RESULT SHOULD BE CORRELATED WITH AN ASSESSMENT OF THE CLINICAL PROBABILITY OF DVT / VTE.                   Cardiovascular Markers Lab Results  Component Value Date   CKTOTAL 39 02/06/2010   CKMB 0.4 02/06/2010   TROPONINI 0.01        NO INDICATION OF MYOCARDIAL INJURY. 02/06/2010   HGB 11.1 (L) 11/18/2014   HCT 34.1 (L) 11/18/2014                 CA Markers No results found for: CEA, CA125, LABCA2               Note: Lab results reviewed.  Recent Diagnostic Imaging Results  DG C-Arm 1-60 Min-No Report Fluoroscopy was utilized by the requesting physician.  No radiographic  interpretation.   Complexity Note: Imaging results reviewed. Results shared with Ms. Gundlach, using Layman's terms.                         Meds   Current Outpatient Medications:  .  busPIRone (BUSPAR) 15 MG tablet, TAKE 1/3 TO 1/2 TAB AS NEEDED FOR ANXIETY UP TO TWICE A DAY, Disp: , Rfl: 3 .  Cholecalciferol (VITAMIN D3) LIQD, Take 3 drops daily by mouth., Disp: , Rfl:  .  conjugated estrogens (PREMARIN) vaginal cream, Premarin 0.625 mg/gram vaginal cream  INSERT 1GRAM VAGINALLY 3 TIMES WEEKLY AT BEDTIME, Disp: , Rfl:  .  gabapentin (NEURONTIN) 300 MG capsule, Take 3 capsules (900 mg total) 2 (two) times daily by mouth. May go up to 3 capsules 4 times a day. Titrate up slowly., Disp: 180 capsule, Rfl: 2 .  Magnesium 500 MG TABS, Take 1 tablet by mouth 2 (two) times daily., Disp: , Rfl:  .  [START ON 08/28/2017] morphine (MS CONTIN) 30 MG 12 hr tablet, Take 1 tablet (30 mg total) every 12 (twelve) hours by mouth., Disp: 60 tablet, Rfl: 0 .  naloxone (NARCAN) 2 MG/2ML injection, Inject content of syringe into thigh muscle. Call 911., Disp: 2 Syringe, Rfl: 1 .  omeprazole (PRILOSEC) 20 MG capsule, Take 20 mg by mouth 2 (two) times daily., Disp: , Rfl:  .  potassium gluconate 595 MG TABS tablet, Take 595 mg by mouth daily as needed (low  potassium)., Disp: , Rfl:  .  PRENATAL VIT-FE FUM-FA-OMEGA PO, Take 1 tablet by mouth daily., Disp: , Rfl:  .  Probiotic Product (ALIGN) 4 MG CAPS, Take 1 capsule by mouth at bedtime. Per Pt., Disp: , Rfl:  .  St Johns Wort (MOVANA PO), Take daily by mouth., Disp: , Rfl:  .  valsartan (DIOVAN) 40 MG tablet, Take 40 mg by mouth every morning., Disp: , Rfl:  .  venlafaxine XR (EFFEXOR-XR) 75 MG 24 hr capsule, Take 225 mg by mouth every morning., Disp: , Rfl:  .  vitamin B-12 (CYANOCOBALAMIN) 500 MCG tablet, Take 500 mcg by mouth 3 (three) times a week., Disp: , Rfl:  .  Wheat Dextrin (BENEFIBER) POWD, Stir 2 teaspoons of Benefiber into 4-8 oz of any non-carbonated beverage or soft food (hot or cold) TID., Disp: 500 g, Rfl: PRN .  [START ON 07/29/2017] morphine (MS CONTIN) 30 MG 12 hr tablet, Take 1 tablet (30 mg total) every 12 (twelve) hours by mouth., Disp: 60 tablet, Rfl: 0 .  [START ON 06/29/2017] morphine (MS CONTIN) 30 MG 12 hr tablet, Take 1 tablet (30 mg total) every 12 (twelve) hours by mouth., Disp: 60 tablet, Rfl: 0  ROS  Constitutional: Denies any fever or chills Gastrointestinal: No reported hemesis, hematochezia, vomiting, or acute GI distress Musculoskeletal: Denies any   acute onset joint swelling, redness, loss of ROM, or weakness Neurological: No reported episodes of acute onset apraxia, aphasia, dysarthria, agnosia, amnesia, paralysis, loss of coordination, or loss of consciousness  Allergies  Ms. Boza is allergic to iohexol; ace inhibitors; losartan potassium; and neosporin [neomycin-bacitracin zn-polymyx].  PFSH  Drug: Ms. Wymer  reports that she does not use drugs. Alcohol:  reports that she does not drink alcohol. Tobacco:  reports that  has never smoked. she has never used smokeless tobacco. 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 Surgical: Ms. 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 Family: family history includes Alzheimer's disease in her father; Hypertension in her mother.  Constitutional Exam  General appearance: Well nourished, well developed, and well hydrated. In no apparent acute distress Vitals:   06/26/17 0852  BP: (!) 149/71  Pulse: 78  Resp: 16  Temp: 98.3 F (36.8 C)  TempSrc: Oral  SpO2: 100%  Weight: 205 lb (93 kg)  Height: 5' 6" (1.676 m)   BMI Assessment: Estimated body mass index is 33.09 kg/m as calculated from the following:   Height as of this encounter: 5' 6" (1.676 m).   Weight as of this encounter: 205 lb (93 kg). Psych/Mental status: Alert, oriented x 3 (person, place, & time)       Eyes: PERLA Respiratory: No evidence of acute respiratory distress  Cervical Spine Area Exam  Skin & Axial Inspection: No masses, redness, edema, swelling, or associated skin lesions Alignment: Symmetrical Functional ROM: Unrestricted ROM      Stability: No instability detected Muscle Tone/Strength: Functionally intact. No obvious neuro-muscular anomalies detected. Sensory (Neurological): Unimpaired Palpation: No palpable anomalies              Upper Extremity (UE) Exam    Side: Right upper extremity  Side: Left upper extremity  Skin & Extremity Inspection: Sling in place  Skin & Extremity Inspection: Skin color, temperature, and hair growth  are WNL. No peripheral edema or cyanosis. No masses, redness, swelling, asymmetry, or associated skin lesions. No contractures.  Functional ROM: Unrestricted ROM          Functional ROM: Unrestricted ROM          Muscle Tone/Strength: Functionally intact. No obvious neuro-muscular anomalies detected.  Muscle Tone/Strength: Functionally intact. No obvious neuro-muscular anomalies detected.  Sensory (Neurological): Unimpaired          Sensory (Neurological): Unimpaired          Palpation: No palpable anomalies              Palpation: No palpable anomalies              Specialized Test(s): Deferred         Specialized Test(s): Deferred          Thoracic Spine Area Exam  Skin & Axial Inspection: No masses, redness, or swelling Alignment: Symmetrical Functional ROM: Unrestricted ROM Stability: No instability detected Muscle Tone/Strength: Functionally intact. No obvious neuro-muscular anomalies detected. Sensory (  Neurological): Unimpaired Muscle strength & Tone: No palpable anomalies  Lumbar Spine Area Exam  Skin & Axial Inspection: No masses, redness, or swelling Alignment: Symmetrical Functional ROM: Unrestricted ROM      Stability: No instability detected Muscle Tone/Strength: Functionally intact. No obvious neuro-muscular anomalies detected. Sensory (Neurological): Unimpaired Palpation: No palpable anomalies       Provocative Tests: Lumbar Hyperextension and rotation test: evaluation deferred today       Lumbar Lateral bending test: evaluation deferred today       Patrick's Maneuver: evaluation deferred today                    Gait & Posture Assessment  Ambulation: Unassisted Gait: Relatively normal for age and body habitus Posture: WNL   Lower Extremity Exam    Side: Right lower extremity  Side: Left lower extremity  Skin & Extremity Inspection: Skin color, temperature, and hair growth are WNL. No peripheral edema or cyanosis. No masses, redness, swelling, asymmetry, or  associated skin lesions. No contractures.  Skin & Extremity Inspection: Skin color, temperature, and hair growth are WNL. No peripheral edema or cyanosis. No masses, redness, swelling, asymmetry, or associated skin lesions. No contractures.  Functional ROM: Unrestricted ROM          Functional ROM: Unrestricted ROM          Muscle Tone/Strength: Functionally intact. No obvious neuro-muscular anomalies detected.  Muscle Tone/Strength: Functionally intact. No obvious neuro-muscular anomalies detected.  Sensory (Neurological): Unimpaired  Sensory (Neurological): Unimpaired  Palpation: No palpable anomalies  Palpation: No palpable anomalies   Assessment  Primary Diagnosis & Pertinent Problem List: The primary encounter diagnosis was Chronic neck pain (Bilateral) (L>R). Diagnoses of Chronic bilateral low back pain without sciatica, Chronic thoracic radicular pain (Left-sided), Chronic pain syndrome, and Neurogenic pain were also pertinent to this visit.  Status Diagnosis  Controlled Controlled Controlled 1. Chronic neck pain (Bilateral) (L>R)   2. Chronic bilateral low back pain without sciatica   3. Chronic thoracic radicular pain (Left-sided)   4. Chronic pain syndrome   5. Neurogenic pain     Problems updated and reviewed during this visit: No problems updated. Plan of Care  Pharmacotherapy (Medications Ordered): Meds ordered this encounter  Medications  . morphine (MS CONTIN) 30 MG 12 hr tablet    Sig: Take 1 tablet (30 mg total) every 12 (twelve) hours by mouth.    Dispense:  60 tablet    Refill:  0    Do not place this medication, or any other prescription from our practice, on "Automatic Refill". Patient may have prescription filled one day early if pharmacy is closed on scheduled refill date. Do not fill until: 08/28/2017 To last until:09/27/2017    Order Specific Question:   Supervising Provider    Answer:   [name] [phone]  . morphine (MS CONTIN) 30 MG 12 hr tablet     Sig: Take 1 tablet (30 mg total) every 12 (twelve) hours by mouth.    Dispense:  60 tablet    Refill:  0    Do not place this medication, or any other prescription from our practice, on "Automatic Refill". Patient may have prescription filled one day early if pharmacy is closed on scheduled refill date. Do not fill until: 07/29/2017 To last until: 08/28/2017    Order Specific Question:   Supervising Provider    Answer:   [name] [phone]  . morphine (MS CONTIN) 30 MG 12 hr tablet  Sig: Take 1 tablet (30 mg total) every 12 (twelve) hours by mouth.    Dispense:  60 tablet    Refill:  0    Do not place this medication, or any other prescription from our practice, on "Automatic Refill". Patient may have prescription filled one day early if pharmacy is closed on scheduled refill date. Do not fill until: 06/29/2017 To last until: 07/29/2017    Order Specific Question:   Supervising Provider    Answer:   NAVEIRA, FRANCISCO [[id number]]  . gabapentin (NEURONTIN) 300 MG capsule    Sig: Take 3 capsules (900 mg total) 2 (two) times daily by mouth. May go up to 3 capsules 4 times a day. Titrate up slowly.    Dispense:  180 capsule    Refill:  2    Do not place this medication, or any other prescription from our practice, on "Automatic Refill". Patient may have prescription filled one day early if pharmacy is closed on scheduled refill date.    Order Specific Question:   Supervising Provider    Answer:   NAVEIRA, FRANCISCO [[id number]]  This SmartLink is deprecated. Use AVSMEDLIST instead to display the medication list for a patient. Medications administered today: Hopie Wardrop had no medications administered during this visit. Lab-work, procedure(s), and/or referral(s): No orders of the defined types were placed in this encounter.  Imaging and/or referral(s): None  Interventional therapies: Planned, scheduled, and/or pending:   Not at this time.   Considering:   Left greater  occipital nerve RFA  Diagnostic left C2 + TON nerve block  Possible left C2 + TON    Palliative PRN treatment(s):   Left greater occipital nerve RFA    Provider-requested follow-up: Return in about 3 months (around 09/26/2017) for MedMgmt.  Future Appointments  Date Time Provider Department Center  09/25/2017  9:00 AM King, Crystal M, NP [facility] None   Primary Care Physician: McNeill, Wendy, MD Location: [facility] Note by: Crystal M. King NP Date: 06/26/2017; Time: 12:50 PM  Pain Score Disclaimer: We use the NRS-11 scale. This is a self-reported, subjective measurement of pain severity with only modest accuracy. It is used primarily to identify changes within a particular patient. It must be understood that outpatient pain scales are significantly less accurate that those used for research, where they can be applied under ideal controlled circumstances with minimal exposure to variables. In reality, the score is likely to be a combination of pain intensity and pain affect, where pain affect describes the degree of emotional arousal or changes in action readiness caused by the sensory experience of pain. Factors such as social and work situation, setting, emotional state, anxiety levels, expectation, and prior pain experience may influence pain perception and show large inter-individual differences that may also be affected by time variables.  Patient instructions provided during this appointment: Patient Instructions    ____________________________________________________________________________________________  Medication Rules  Applies to: All patients receiving prescriptions (written or electronic).  Pharmacy of record: Pharmacy where electronic prescriptions will be sent. If written prescriptions are taken to a different pharmacy, please inform the nursing staff. The pharmacy listed in the electronic medical record should be the one where you would  like electronic prescriptions to be sent.  Prescription refills: Only during scheduled appointments. Applies to both, written and electronic prescriptions.  NOTE: The following applies primarily to controlled substances (Opioid* Pain Medications).   Patient's responsibilities: 1. Pain Pills: Bring all pain pills to every appointment (except for procedure appointments).   2. Pill Bottles: Bring pills in original pharmacy bottle. Always bring newest bottle. Bring bottle, even if empty. 3. Medication refills: You are responsible for knowing and keeping track of what medications you need refilled. The day before your appointment, write a list of all prescriptions that need to be refilled. Bring that list to your appointment and give it to the admitting nurse. Prescriptions will be written only during appointments. If you forget a medication, it will not be "Called in", "Faxed", or "electronically sent". You will need to get another appointment to get these prescribed. 4. Prescription Accuracy: You are responsible for carefully inspecting your prescriptions before leaving our office. Have the discharge nurse carefully go over each prescription with you, before taking them home. Make sure that your name is accurately spelled, that your address is correct. Check the name and dose of your medication to make sure it is accurate. Check the number of pills, and the written instructions to make sure they are clear and accurate. Make sure that you are given enough medication to last until your next medication refill appointment. 5. Taking Medication: Take medication as prescribed. Never take more pills than instructed. Never take medication more frequently than prescribed. Taking less pills or less frequently is permitted and encouraged, when it comes to controlled substances (written prescriptions).  6. Inform other Doctors: Always inform, all of your healthcare providers, of all the medications you take. 7. Pain  Medication from other Providers: You are not allowed to accept any additional pain medication from any other Doctor or Healthcare provider. There are two exceptions to this rule. (see below) In the event that you require additional pain medication, you are responsible for notifying us, as stated below. 8. Medication Agreement: You are responsible for carefully reading and following our Medication Agreement. This must be signed before receiving any prescriptions from our practice. Safely store a copy of your signed Agreement. Violations to the Agreement will result in no further prescriptions. (Additional copies of our Medication Agreement are available upon request.) 9. Laws, Rules, & Regulations: All patients are expected to follow all Federal and State Laws, Statutes, Rules, & Regulations. Ignorance of the Laws does not constitute a valid excuse. The use of any illegal substances is prohibited. 10. Adopted CDC guidelines & recommendations: Target dosing levels will be at or below 60 MME/day. Use of benzodiazepines** is not recommended.  Exceptions: There are only two exceptions to the rule of not receiving pain medications from other Healthcare Providers. 1. Exception #1 (Emergencies): In the event of an emergency (i.e.: accident requiring emergency care), you are allowed to receive additional pain medication. However, you are responsible for: As soon as you are able, call our office [phone], at any time of the day or night, and leave a message stating your name, the date and nature of the emergency, and the name and dose of the medication prescribed. In the event that your call is answered by a member of our staff, make sure to document and save the date, time, and the name of the person that took your information.  2. Exception #2 (Planned Surgery): In the event that you are scheduled by another doctor or dentist to have any type of surgery or procedure, you are allowed (for a period no longer than  30 days), to receive additional pain medication, for the acute post-op pain. However, in this case, you are responsible for picking up a copy of our "Post-op Pain Management for Surgeons" handout, and giving it   to your surgeon or dentist. This document is available at our office, and does not require an appointment to obtain it. Simply go to our office during business hours (Monday-Thursday from 8:00 AM to 4:00 PM) (Friday 8:00 AM to 12:00 Noon) or if you have a scheduled appointment with [place], prior to your surgery, and ask for it by name. In addition, you will need to provide [place] with your name, name of your surgeon, type of surgery, and date of procedure or surgery.  *Opioid medications include: morphine, codeine, oxycodone, oxymorphone, hydrocodone, hydromorphone, meperidine, tramadol, tapentadol, buprenorphine, fentanyl, methadone. **Benzodiazepine medications include: diazepam (Valium), alprazolam (Xanax), clonazepam (Klonopine), lorazepam (Ativan), clorazepate (Tranxene), chlordiazepoxide (Librium), estazolam (Prosom), oxazepam (Serax), temazepam (Restoril), triazolam (Halcion)  ____________________________________________________________________________________________   BMI Assessment: Estimated body mass index is 33.09 kg/m as calculated from the following:   Height as of this encounter: 5' 6" (1.676 m).   Weight as of this encounter: 205 lb (93 kg).  BMI interpretation table: BMI level Category Range association with higher incidence of chronic pain  <18 kg/m2 Underweight   18.5-24.9 kg/m2 Ideal body weight   25-29.9 kg/m2 Overweight Increased incidence by 20%  30-34.9 kg/m2 Obese (Class I) Increased incidence by 68%  35-39.9 kg/m2 Severe obesity (Class II) Increased incidence by 136%  >40 kg/m2 Extreme obesity (Class III) Increased incidence by 254%   BMI Readings from Last 4 Encounters:  06/26/17 33.09 kg/m  04/02/17 30.67 kg/m  03/03/17 30.67 kg/m  01/21/17 30.67 kg/m   Wt  Readings from Last 4 Encounters:  06/26/17 205 lb (93 kg)  04/02/17 190 lb (86.2 kg)  03/03/17 190 lb (86.2 kg)  01/21/17 190 lb (86.2 kg)   A prescription for Gabapentin was sent to your pharmacy. You were given 3 prescriptions for Morphine today.

## 2017-07-01 ENCOUNTER — Emergency Department (HOSPITAL_COMMUNITY): Payer: Medicare Other

## 2017-07-01 ENCOUNTER — Emergency Department (HOSPITAL_COMMUNITY)
Admission: EM | Admit: 2017-07-01 | Discharge: 2017-07-01 | Disposition: A | Discharge status: Home / Self Care | Payer: Medicare Other | Attending: Emergency Medicine | Admitting: Emergency Medicine

## 2017-07-01 ENCOUNTER — Encounter (HOSPITAL_COMMUNITY): Payer: Self-pay | Admitting: *Deleted

## 2017-07-01 DIAGNOSIS — Y939 Activity, unspecified: Secondary | ICD-10-CM | POA: Diagnosis not present

## 2017-07-01 DIAGNOSIS — W01198A Fall on same level from slipping, tripping and stumbling with subsequent striking against other object, initial encounter: Secondary | ICD-10-CM | POA: Diagnosis not present

## 2017-07-01 DIAGNOSIS — Y999 Unspecified external cause status: Secondary | ICD-10-CM | POA: Diagnosis not present

## 2017-07-01 DIAGNOSIS — Z23 Encounter for immunization: Secondary | ICD-10-CM | POA: Insufficient documentation

## 2017-07-01 DIAGNOSIS — S0990XA Unspecified injury of head, initial encounter: Secondary | ICD-10-CM | POA: Insufficient documentation

## 2017-07-01 DIAGNOSIS — I10 Essential (primary) hypertension: Secondary | ICD-10-CM | POA: Insufficient documentation

## 2017-07-01 DIAGNOSIS — W19XXXA Unspecified fall, initial encounter: Secondary | ICD-10-CM

## 2017-07-01 DIAGNOSIS — Z79899 Other long term (current) drug therapy: Secondary | ICD-10-CM | POA: Insufficient documentation

## 2017-07-01 DIAGNOSIS — Y92002 Bathroom of unspecified non-institutional (private) residence single-family (private) house as the place of occurrence of the external cause: Secondary | ICD-10-CM | POA: Insufficient documentation

## 2017-07-01 DIAGNOSIS — S0101XA Laceration without foreign body of scalp, initial encounter: Secondary | ICD-10-CM | POA: Insufficient documentation

## 2017-07-01 MED ORDER — LIDOCAINE-EPINEPHRINE (PF) 2 %-1:200000 IJ SOLN
10.0000 mL | Freq: Once | INTRAMUSCULAR | Status: AC
  Administered 2017-07-01: 10 mL via INTRADERMAL
  Filled 2017-07-01: qty 20

## 2017-07-01 MED ORDER — TETANUS-DIPHTH-ACELL PERTUSSIS 5-2.5-18.5 LF-MCG/0.5 IM SUSP
0.5000 mL | Freq: Once | INTRAMUSCULAR | Status: AC
  Administered 2017-07-01: 0.5 mL via INTRAMUSCULAR
  Filled 2017-07-01: qty 0.5

## 2017-07-01 NOTE — ED Provider Notes (Signed)
[facility] DEPT Provider Note   CSN: [id number] Arrival date & time: 07/01/17  0301     History   Chief Complaint Chief Complaint  Patient presents with  . Head Injury    HPI Kari Gibbs is a 66 y.o. female.  HPI Kari Gibbs is a 66 y.o. female presents to emergency department complaining of a fall.  Patient states that she fell in her bathroom early this morning.  She states that she has been falling frequently due to being overmedicated due to chronic pain.  She reports chronic pain due to osteoarthritis and states she just had right rotator cuff repair 2 weeks ago.  She states she takes a large amount of pain medications.  She states pain medication makes her unstable.  She states she has seen a neurologist for these falls and had a full workup which was all unremarkable.  She denies feeling dizzy or lightheaded prior to falling.  She states when she fell she landed on the right hip and hit the back of her head on the bathtub.  Denies loss of consciousness.  She is not anticoagulated.  She reports severe headache.  No nausea or vomiting.  No dizziness.  No memory loss.  No confusion.  No treatment prior to coming in.  Tetanus is unknown.  She denies any other injuries, specifically denies any pain to her neck, back, hips, upper or lower extremities.  Past Medical History:  Diagnosis Date  . Abdominal pain   . Anxiety   . Arthritis    OA LOWER SPINE  . Depression    MOSTLY IN THE FALL OF THE YEAR  . 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 Fatigue   . Gastritis    Distal gastritis  . GERD (gastroesophageal reflux disease)    "told that she does"  . Headache(784.0)    HX OF MIGRAINES  . Hiatal hernia 09/2009  . History of kidney stones   . History of rib fracture (Left 4th and 8th rib) 06/28/2015  . Hypertension   . IBS (irritable bowel syndrome)   . Nausea & vomiting   . OSTEOARTHRITIS 05/01/2010   Qualifier: Diagnosis of  By: Megan Salon MD, John    . Pneumonia    WHEN PT IN 6 TH GRADE  . SBO (small bowel obstruction) ([place]) 2013  . Varicose veins     Patient Active Problem List   Diagnosis Date Noted  . Acute costochondritis (Left) 01/21/2017  . Morbid obesity ([place]) 01/21/2017  . Chronic bilateral low back pain without sciatica 09/12/2016  . Cervical spondylosis 07/17/2016  . Muscle twitching 06/12/2016  . Muscle spasticity 05/20/2016  . Tremors of nervous system 05/20/2016  . Chronic neck pain (Bilateral) (L>R) 03/27/2016  . Cervical facet syndrome (Bilateral) (L>R) 03/27/2016  . Varicose veins of right lower extremity with complications 28/78/6767  . Varicose veins of bilateral lower extremities with other complications 20/94/7096  . Long term current use of opiate analgesic 06/28/2015  . Long term prescription opiate use 06/28/2015  . Opiate use (60 MME/day) 06/28/2015  . Encounter for therapeutic drug level monitoring 06/28/2015  . Opiate dependence ([place]) 06/28/2015  . Encounter for chronic pain management 06/28/2015  . Chronic pain syndrome 06/28/2015  . Chronic flank pain (Location of Primary Source of Pain) (Left) 06/28/2015  . Chronic thoracic radicular pain (Left-sided) 06/28/2015  . Opioid-induced constipation (OIC) 06/28/2015  . Chronic occipital neuralgia (Left) 06/28/2015  . Neuropathic pain 06/28/2015  .  Neurogenic pain 06/28/2015  . History of rib fracture (Left 4th and 8th rib) 06/28/2015  . 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 Metatarsalgia of right foot 06/28/2015  . Chronic abdominal pain (Bilateral) (L>R) (possible left-sided thoracic radiculopathy) 06/28/2015  . Osteoarthritis 06/28/2015  . Return to work evaluation (FCE: Medium) 06/28/2015  . Fracture of rib of left side 10/28/2013  . S/P laparoscopic cholecystectomy Oct 2014 05/18/2013  . 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 Depression 05/01/2010  .  Essential hypertension 05/01/2010  . GERD 05/01/2010  . 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 Allergic rhinitis 06/26/2007    Past Surgical History:  Procedure Laterality Date  . ABDOMINAL HYSTERECTOMY  1988   partial  . EXPLORATORY LAPAROTOMY  2012  . GASTRIC BYPASS  11/13/2009  . HERNIA REPAIR  2011   hiatal  . LAPAROSCOPIC CHOLECYSTECTOMY WITH INTRAOPERATIVE CHOLANGIOGRAM/laparoscopy/ N/A 05/18/2013   Performed by Kaylyn Lim, MD at [facility] ORS  . LAPAROSCOPY DIAGNOSTIC N/A 03/04/2012   Performed by Pedro Earls, MD at [facility] ORS  . TMJ- LEFT--ABOUT 10 YRS AGO--LEFT SIDE--PT NOW HAS SOME PAIN LEFT JAW Left    still has pain, scar tissue has been removed  . UPPER GI ENDOSCOPY  03/04/2012   Performed by Pedro Earls, MD at [facility] ORS    OB History    No data available       Home Medications    Prior to Admission medications   Medication Sig Start Date End Date Taking? Authorizing Provider  busPIRone (BUSPAR) 15 MG tablet TAKE 1/3 TO 1/2 TAB AS NEEDED FOR ANXIETY UP TO TWICE A DAY 03/18/17  Yes [provider]  Cholecalciferol (VITAMIN D3) LIQD Take 3 drops daily by mouth.   Yes [provider]  conjugated estrogens (PREMARIN) vaginal cream Premarin 0.625 mg/gram vaginal cream  INSERT 1GRAM VAGINALLY 3 TIMES WEEKLY AT BEDTIME   Yes [provider]  gabapentin (NEURONTIN) 300 MG capsule Take 3 capsules (900 mg total) 2 (two) times daily by mouth. May go up to 3 capsules 4 times a day. Titrate up slowly. 06/26/17 09/24/17 Yes Vevelyn Francois, NP  Magnesium 500 MG TABS Take 1 tablet by mouth 2 (two) times daily.   Yes [provider]  morphine (MS CONTIN) 30 MG 12 hr tablet Take 1 tablet (30 mg total) every 12 (twelve) hours by mouth. 06/29/17 07/29/17 Yes King, Diona Foley, NP  naloxone [facility]) 2 MG/2ML injection Inject content of syringe into thigh muscle. Call 911. 03/27/16  Yes [name], MD  PRENATAL VIT-FE FUM-FA-OMEGA PO Take 1  tablet by mouth daily.   Yes [provider]  Probiotic Product (ALIGN) 4 MG CAPS Take 1 capsule by mouth at bedtime. Per Pt.   Yes [provider]  valsartan (DIOVAN) 40 MG tablet Take 40 mg by mouth every morning.   Yes [provider]  venlafaxine XR (EFFEXOR-XR) 75 MG 24 hr capsule Take 225 mg by mouth every morning.   Yes [provider]  vitamin B-12 (CYANOCOBALAMIN) 500 MCG tablet Take 500 mcg by mouth 3 (three) times a week.   Yes [provider]  Wheat Dextrin (BENEFIBER) POWD Stir 2 teaspoons of Benefiber into 4-8 oz of any non-carbonated beverage or soft food (hot or cold) TID. 03/27/16  Yes [name], MD  morphine (MS CONTIN) 30 MG 12 hr tablet Take 1 tablet (30 mg total) every 12 (twelve) hours by mouth. 08/28/17  09/27/17  Vevelyn Francois, NP  morphine (MS CONTIN) 30 MG 12 hr tablet Take 1 tablet (30 mg total) every 12 (twelve) hours by mouth. 07/29/17 08/28/17  Vevelyn Francois, NP    Family History Family History  Problem Relation Age of Onset  . Hypertension Mother   . Alzheimer's disease Father     Social History Social History   Tobacco Use  . Smoking status: Never Smoker  . Smokeless tobacco: Never Used  Substance Use Topics  . Alcohol use: No    Alcohol/week: 0.0 oz  . Drug use: No     Allergies   Iohexol; Ace inhibitors; Losartan potassium; and Neosporin [neomycin-bacitracin zn-polymyx]   Review of Systems Review of Systems  Constitutional: Negative for chills and fever.  Respiratory: Negative for cough, chest tightness and shortness of breath.   Cardiovascular: Negative for chest pain, palpitations and leg swelling.  Gastrointestinal: Negative for abdominal pain, diarrhea, nausea and vomiting.  Genitourinary: Negative for dysuria, flank pain and pelvic pain.  Musculoskeletal: Negative for arthralgias, myalgias, neck pain and neck stiffness.  Skin: Positive for wound. Negative for rash.  Neurological:  Positive for headaches. Negative for dizziness and weakness.  All other systems reviewed and are negative.    Physical Exam Updated Vital Signs BP 134/67 (BP Location: Left Arm)   Pulse 74   Temp 98.4 F (36.9 C) (Oral)   Resp 18   Ht 5\' 6"  (1.676 m)   Wt 93 kg (205 lb)   SpO2 99%   BMI 33.09 kg/m   Physical Exam  Constitutional: She is oriented to person, place, and time. She appears well-developed and well-nourished. No distress.  HENT:  Head: Normocephalic.  6 cm laceration to the right posterior scalp.  Hemostatic.  Eyes: Conjunctivae and EOM are normal. Pupils are equal, round, and reactive to light.  Neck: Normal range of motion. Neck supple.  No midline tenderness  Cardiovascular: Normal rate, regular rhythm and normal heart sounds.  Pulmonary/Chest: Effort normal and breath sounds normal. No respiratory distress. She has no wheezes. She has no rales.  Abdominal: Soft. Bowel sounds are normal. She exhibits no distension. There is no tenderness. There is no rebound.  Musculoskeletal: Normal range of motion. She exhibits no edema.  Neurological: She is alert and oriented to person, place, and time.  5/5 and equal upper and lower extremity strength bilaterally. Equal grip strength bilaterally. Normal finger to nose and heel to shin. No pronator drift  Skin: Skin is warm and dry.  Psychiatric: She has a normal mood and affect. Her behavior is normal.  Nursing note and vitals reviewed.    ED Treatments / Results  Labs (all labs ordered are listed, but only abnormal results are displayed) Labs Reviewed - No data to display  EKG  EKG Interpretation None       Radiology No results found.  Procedures .Marland KitchenLaceration Repair Date/Time: 07/01/2017 7:10 AM Performed by: Jeannett Senior, PA-C Authorized by: Jeannett Senior, PA-C   Consent:    Consent obtained:  Verbal   Consent given by:  Patient   Risks discussed:  Infection, pain and retained foreign  body   Alternatives discussed:  No treatment and delayed treatment Anesthesia (see MAR for exact dosages):    Anesthesia method:  Local infiltration   Local anesthetic:  Lidocaine 2% WITH epi Laceration details:    Location:  Scalp   Scalp location:  Occipital   Length (cm):  6 Repair type:    Repair type:  Simple Pre-procedure details:    Preparation:  Patient was prepped and draped in usual sterile fashion Exploration:    Wound exploration: wound explored through full range of motion and entire depth of wound probed and visualized     Contaminated: no   Treatment:    Area cleansed with:  Saline   Amount of cleaning:  Standard   Irrigation method:  Syringe   Visualized foreign bodies/material removed: no   Skin repair:    Repair method:  Staples   Number of staples:  5 Approximation:    Approximation:  Close   Vermilion border: well-aligned   Post-procedure details:    Dressing:  Antibiotic ointment   Patient tolerance of procedure:  Tolerated well, no immediate complications   (including critical care time)  Medications Ordered in ED Medications  lidocaine-EPINEPHrine (XYLOCAINE W/EPI) 2 %-1:200000 (PF) injection 10 mL (not administered)  Tdap (BOOSTRIX) injection 0.5 mL (not administered)     Initial Impression / Assessment and Plan / ED Course  I have reviewed the triage vital signs and the nursing notes.  Pertinent labs & imaging results that were available during my care of the patient were reviewed by me and considered in my medical decision making (see chart for details).     Patient in the emergency department after a fall.  History of frequent falls which she believes is due to overmedication.  Has had workup by neurology.  No dizziness or weakness prior to the fall.  will get EKG, will get CT head. Neurovascularly intact.    Laceration repaired.  CT scan is negative.  Will discharge home with wound care and strict return precautions.  Suture/staple removal  in 10 days.  All questions answered, patient voiced understanding of dc instructions.   Vitals:   07/01/17 0321 07/01/17 0322 07/01/17 0549  BP:  (!) 176/97 134/67  Pulse:  86 74  Resp:  18 18  Temp:  98.4 F (36.9 C)   TempSrc:  Oral   SpO2:  99% 99%  Weight: 93 kg (205 lb)    Height: 5\' 6"  (1.676 m)    \  Final Clinical Impressions(s) / ED Diagnoses   Final diagnoses:  Injury of head, initial encounter  Fall, initial encounter  Laceration of scalp, initial encounter    ED Discharge Orders    None       Jeannett Senior, PA-C 07/01/17 0740    Palumbo, April, MD 07/01/17 0740

## 2017-07-01 NOTE — ED Notes (Signed)
No vision changes  No loc  No blood thinner

## 2017-07-01 NOTE — ED Triage Notes (Signed)
Pt states around 2am she was in the bathroom, turning to wipe her hands on the towel when she fell back and struck her head on the tub. She has a lac to the top of her head with hematoma. Pt denies dizziness, blurry vision or vomiting. No blood thinners. Pt did take her oxycodone around midnight for her shoulder pain.

## 2017-07-01 NOTE — Discharge Instructions (Signed)
Wash as usual, avoid brushing through stapled area. Follow up for staple removal in 10 days. Return if any worsening headache, vomiting, memory loss, confusion, any new concerning symptoms.

## 2017-07-17 ENCOUNTER — Telehealth: Payer: Self-pay | Admitting: Vascular Surgery

## 2017-07-17 ENCOUNTER — Other Ambulatory Visit: Payer: Self-pay | Admitting: *Deleted

## 2017-07-17 DIAGNOSIS — I83812 Varicose veins of left lower extremities with pain: Secondary | ICD-10-CM

## 2017-07-17 NOTE — Telephone Encounter (Signed)
Sched appt 09/02/17; lab at 1:00 and MD at 2:15. Spoke to pt.

## 2017-07-17 NOTE — Telephone Encounter (Signed)
-----   Message from Rolla Flatten, RN sent at 07/17/2017  3:42 PM EST ----- She needs a reflux study of her L leg and JDL visit. I put the order in. Plz sched this after the first of the year. Thx!

## 2017-09-02 ENCOUNTER — Ambulatory Visit: Payer: Medicare Other | Admitting: Vascular Surgery

## 2017-09-02 ENCOUNTER — Ambulatory Visit (HOSPITAL_COMMUNITY)
Admission: RE | Admit: 2017-09-02 | Discharge: 2017-09-02 | Disposition: A | Discharge status: Home / Self Care | Payer: Medicare Other | Source: Ambulatory Visit | Attending: Vascular Surgery | Admitting: Vascular Surgery

## 2017-09-02 VITALS — BP 111/68 | HR 88 | Temp 98.5°F | Resp 18 | Ht 65.0 in | Wt 205.3 lb

## 2017-09-02 DIAGNOSIS — I872 Venous insufficiency (chronic) (peripheral): Secondary | ICD-10-CM | POA: Diagnosis not present

## 2017-09-02 DIAGNOSIS — I83892 Varicose veins of left lower extremities with other complications: Secondary | ICD-10-CM

## 2017-09-02 DIAGNOSIS — I83812 Varicose veins of left lower extremities with pain: Secondary | ICD-10-CM | POA: Insufficient documentation

## 2017-09-02 DIAGNOSIS — R609 Edema, unspecified: Secondary | ICD-10-CM | POA: Insufficient documentation

## 2017-09-02 NOTE — Progress Notes (Signed)
Subjective:     Patient ID: Kari Gibbs, female   DOB: 10/21/1950, 67 y.o.   MRN: [id number]  HPI This 67 year old female is known to me having had laser ablation of the right great saphenous vein with multiple stab phlebectomy of painful varicosities in January 2017. She had a good result. She had varicosities and reticular veins in the left leg at that time but her great saphenous vein did not meet criteria for laser ablation. Over the past several months she has developed increasing aching throbbing discomfort in the left thigh and calf similar to what she had on the contralateral right leg prior to her ablation. Her symptoms in the right leg have been eliminated. She developed a skin rash in both lower extremities and her left upper extremity 9 months ago which has persisted. She has not had this evaluated by dermatologist. She has no history of DVT thrombophlebitis stasis ulcers or bleeding. Her symptoms are affecting her daily living in the left leg.  Past Medical History:  Diagnosis Date  . Abdominal pain   . Anxiety   . Arthritis    OA LOWER SPINE  . Depression    MOSTLY IN THE FALL OF THE YEAR  . 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 Fatigue   . Gastritis    Distal gastritis  . GERD (gastroesophageal reflux disease)    "told that she does"  . Headache(784.0)    HX OF MIGRAINES  . Hiatal hernia 09/2009  . History of kidney stones   . History of rib fracture (Left 4th and 8th rib) 06/28/2015  . Hypertension   . IBS (irritable bowel syndrome)   . Nausea & vomiting   . OSTEOARTHRITIS 05/01/2010   Qualifier: Diagnosis of  By: Megan Salon MD, John    . Pneumonia    WHEN PT IN 6 TH GRADE  . SBO (small bowel obstruction) ([place]) 2013  . Varicose veins     Social History   Tobacco Use  . Smoking status: Never Smoker  . Smokeless tobacco: Never Used  Substance Use Topics  . Alcohol use: No    Alcohol/week: 0.0 oz    Family History  Problem  Relation Age of Onset  . Hypertension Mother   . Alzheimer's disease Father     Allergies  Allergen Reactions  . Iohexol      Code: HIVES, Desc: Pt has had a prior IVP dye reaction,(no scans in health system).Symptoms were hives,and airway obstruction!, Onset Date: 58832549   . Ace Inhibitors     REACTION: cyclic cough  . Losartan Potassium Other (See Comments)    Raises blood pressure; fatigue  . Neosporin [Neomycin-Bacitracin Zn-Polymyx] Dermatitis     Current Outpatient Medications:  .  busPIRone (BUSPAR) 15 MG tablet, TAKE 1/3 TO 1/2 TAB AS NEEDED FOR ANXIETY UP TO TWICE A DAY, Disp: , Rfl: 3 .  Cholecalciferol (VITAMIN D3) LIQD, Take 3 drops daily by mouth., Disp: , Rfl:  .  conjugated estrogens (PREMARIN) vaginal cream, Premarin 0.625 mg/gram vaginal cream  INSERT 1GRAM VAGINALLY 3 TIMES WEEKLY AT BEDTIME, Disp: , Rfl:  .  gabapentin (NEURONTIN) 300 MG capsule, Take 3 capsules (900 mg total) 2 (two) times daily by mouth. May go up to 3 capsules 4 times a day. Titrate up slowly., Disp: 180 capsule, Rfl: 2 .  irbesartan (AVAPRO) 75 MG tablet, Take 75 mg by mouth daily., Disp: , Rfl:  .  Magnesium 500 MG TABS, Take  1 tablet by mouth 2 (two) times daily., Disp: , Rfl:  .  morphine (MS CONTIN) 30 MG 12 hr tablet, Take 1 tablet (30 mg total) every 12 (twelve) hours by mouth., Disp: 60 tablet, Rfl: 0 .  naloxone (NARCAN) 2 MG/2ML injection, Inject content of syringe into thigh muscle. Call 911., Disp: 2 Syringe, Rfl: 1 .  omeprazole (PRILOSEC) 20 MG capsule, Take 20 mg by mouth 2 (two) times daily before a meal., Disp: , Rfl:  .  PRENATAL VIT-FE FUM-FA-OMEGA PO, Take 1 tablet by mouth daily., Disp: , Rfl:  .  Probiotic Product (ALIGN) 4 MG CAPS, Take 1 capsule by mouth at bedtime. Per Pt., Disp: , Rfl:  .  Wheat Dextrin (BENEFIBER) POWD, Stir 2 teaspoons of Benefiber into 4-8 oz of any non-carbonated beverage or soft food (hot or cold) TID., Disp: 500 g, Rfl: PRN .  morphine (MS CONTIN)  30 MG 12 hr tablet, Take 1 tablet (30 mg total) every 12 (twelve) hours by mouth., Disp: 60 tablet, Rfl: 0 .  morphine (MS CONTIN) 30 MG 12 hr tablet, Take 1 tablet (30 mg total) every 12 (twelve) hours by mouth., Disp: 60 tablet, Rfl: 0 .  valsartan (DIOVAN) 40 MG tablet, Take 40 mg by mouth every morning., Disp: , Rfl:  .  venlafaxine XR (EFFEXOR-XR) 75 MG 24 hr capsule, Take 225 mg by mouth every morning., Disp: , Rfl:  .  vitamin B-12 (CYANOCOBALAMIN) 500 MCG tablet, Take 500 mcg by mouth 3 (three) times a week., Disp: , Rfl:   Vitals:   09/02/17 1353  BP: 111/68  Pulse: 88  Resp: 18  Temp: 98.5 F (36.9 C)  TempSrc: Oral  SpO2: 99%  Weight: 205 lb 4.8 oz (93.1 kg)  Height: 5\' 5"  (1.651 m)    Body mass index is 34.16 kg/m.         Review of Systems Denies chest pain, dyspnea on exertion, PND, orthopnea, hemoptysis, claudication    Objective:   Physical Exam BP 111/68 (BP Location: Left Arm, Patient Position: Sitting, Cuff Size: Large)   Pulse 88   Temp 98.5 F (36.9 C) (Oral)   Resp 18   Ht 5\' 5"  (1.651 m)   Wt 205 lb 4.8 oz (93.1 kg)   SpO2 99%   BMI 34.16 kg/m   Gen. well-developed well-nourished female no apparent distress alert and oriented 3 Lungs no rhonchi or wheezing Cardiovascular regular rhythm no murmurs Left leg with 1+ edema throughout. No edema on the right leg. Diffuse spider veins in the medial and lateral thigh of the left leg extending down toward medial malleolus. Symmetrical maculopapular rash below the knee medially and lateral calf as well as upper extremity on the left. No infection noted.  Today I ordered a venous duplex exam the left leg which I reviewed and interpreted. There is no DVT. There is enlargement of the left great saphenous vein much bigger than it was compared to the January 2017 study and there is gross reflux throughout  Also performed a bedside sono site exam independently and agree that there is a significantly enlarged  left great saphenous vein with gross reflux throughout     Assessment:     Pain and swelling left leg with varicosities medial thigh-reticular veins-due to gross reflux left great saphenous vein causing symptoms which are affecting patient's daily living Lower extremity bilateral rash and left upper extremity rash-maculopapular-present for 9 months-etiology unknown unrelated to her venous disease    Plan:         #  1 long leg elastic compression stockings 20-30 mm gradient #2 elevate legs as much as possible #3 ibuprofen daily on a regular basis for pain #4 return in 3 months-if no significant improvement then she will need laser ablation left great saphenous vein followed by three-month waiting period and then be evaluated for probable foam sclerotherapy of the varicosities Patient is to have the skin rash evaluated by dermatologist in the interim and will then make a decision we see her in 3 months

## 2017-09-25 ENCOUNTER — Encounter: Payer: Self-pay | Admitting: Nurse Practitioner

## 2017-09-25 ENCOUNTER — Ambulatory Visit: Payer: Medicare Other | Attending: Nurse Practitioner | Admitting: Nurse Practitioner

## 2017-09-25 VITALS — BP 145/62 | HR 72 | Temp 97.7°F | Resp 16 | Ht 66.0 in | Wt 205.0 lb

## 2017-09-25 DIAGNOSIS — R251 Tremor, unspecified: Secondary | ICD-10-CM | POA: Insufficient documentation

## 2017-09-25 DIAGNOSIS — R109 Unspecified abdominal pain: Secondary | ICD-10-CM | POA: Diagnosis not present

## 2017-09-25 DIAGNOSIS — Z6833 Body mass index (BMI) 33.0-33.9, adult: Secondary | ICD-10-CM | POA: Insufficient documentation

## 2017-09-25 DIAGNOSIS — G8929 Other chronic pain: Secondary | ICD-10-CM | POA: Diagnosis not present

## 2017-09-25 DIAGNOSIS — Z87442 Personal history of urinary calculi: Secondary | ICD-10-CM | POA: Diagnosis not present

## 2017-09-25 DIAGNOSIS — Z9049 Acquired absence of other specified parts of digestive tract: Secondary | ICD-10-CM | POA: Insufficient documentation

## 2017-09-25 DIAGNOSIS — M47812 Spondylosis without myelopathy or radiculopathy, cervical region: Secondary | ICD-10-CM | POA: Diagnosis not present

## 2017-09-25 DIAGNOSIS — F419 Anxiety disorder, unspecified: Secondary | ICD-10-CM | POA: Diagnosis not present

## 2017-09-25 DIAGNOSIS — Z888 Allergy status to other drugs, medicaments and biological substances status: Secondary | ICD-10-CM | POA: Insufficient documentation

## 2017-09-25 DIAGNOSIS — I10 Essential (primary) hypertension: Secondary | ICD-10-CM | POA: Insufficient documentation

## 2017-09-25 DIAGNOSIS — G894 Chronic pain syndrome: Secondary | ICD-10-CM | POA: Insufficient documentation

## 2017-09-25 DIAGNOSIS — M5481 Occipital neuralgia: Secondary | ICD-10-CM | POA: Diagnosis not present

## 2017-09-25 DIAGNOSIS — M792 Neuralgia and neuritis, unspecified: Secondary | ICD-10-CM

## 2017-09-25 DIAGNOSIS — R1012 Left upper quadrant pain: Secondary | ICD-10-CM | POA: Diagnosis not present

## 2017-09-25 DIAGNOSIS — Z79899 Other long term (current) drug therapy: Secondary | ICD-10-CM | POA: Insufficient documentation

## 2017-09-25 DIAGNOSIS — M5414 Radiculopathy, thoracic region: Secondary | ICD-10-CM

## 2017-09-25 DIAGNOSIS — K219 Gastro-esophageal reflux disease without esophagitis: Secondary | ICD-10-CM | POA: Insufficient documentation

## 2017-09-25 DIAGNOSIS — Z90710 Acquired absence of both cervix and uterus: Secondary | ICD-10-CM | POA: Diagnosis not present

## 2017-09-25 DIAGNOSIS — Z8781 Personal history of (healed) traumatic fracture: Secondary | ICD-10-CM | POA: Insufficient documentation

## 2017-09-25 DIAGNOSIS — M7741 Metatarsalgia, right foot: Secondary | ICD-10-CM | POA: Diagnosis not present

## 2017-09-25 DIAGNOSIS — R1011 Right upper quadrant pain: Secondary | ICD-10-CM | POA: Diagnosis not present

## 2017-09-25 DIAGNOSIS — K589 Irritable bowel syndrome without diarrhea: Secondary | ICD-10-CM | POA: Diagnosis not present

## 2017-09-25 DIAGNOSIS — Z8249 Family history of ischemic heart disease and other diseases of the circulatory system: Secondary | ICD-10-CM | POA: Insufficient documentation

## 2017-09-25 DIAGNOSIS — Z820 Family history of epilepsy and other diseases of the nervous system: Secondary | ICD-10-CM | POA: Insufficient documentation

## 2017-09-25 DIAGNOSIS — M488X2 Other specified spondylopathies, cervical region: Secondary | ICD-10-CM | POA: Diagnosis not present

## 2017-09-25 DIAGNOSIS — Z79891 Long term (current) use of opiate analgesic: Secondary | ICD-10-CM

## 2017-09-25 DIAGNOSIS — F329 Major depressive disorder, single episode, unspecified: Secondary | ICD-10-CM | POA: Insufficient documentation

## 2017-09-25 DIAGNOSIS — Z5181 Encounter for therapeutic drug level monitoring: Secondary | ICD-10-CM | POA: Insufficient documentation

## 2017-09-25 DIAGNOSIS — R51 Headache: Secondary | ICD-10-CM | POA: Insufficient documentation

## 2017-09-25 DIAGNOSIS — Z91041 Radiographic dye allergy status: Secondary | ICD-10-CM | POA: Insufficient documentation

## 2017-09-25 DIAGNOSIS — Z9884 Bariatric surgery status: Secondary | ICD-10-CM | POA: Insufficient documentation

## 2017-09-25 MED ORDER — MORPHINE SULFATE ER 30 MG PO TBCR: 30.0000 mg | EXTENDED_RELEASE_TABLET | Freq: Two times a day (BID) | ORAL | 0 refills | Status: DC

## 2017-09-25 MED ORDER — GABAPENTIN 300 MG PO CAPS: 900.0000 mg | ORAL_CAPSULE | Freq: Two times a day (BID) | ORAL | 2 refills | Status: DC

## 2017-09-25 NOTE — Progress Notes (Signed)
Patient's Name: Kari Gibbs  MRN: [id number]  Referring Provider: Cari Caraway, MD  DOB: Apr 23, 1951  PCP: Cari Caraway, MD  DOS: 09/25/2017  Note by: Vevelyn Francois NP  Service setting: Ambulatory outpatient  Specialty: Interventional Pain Management  Location: [facility] (AMB) Pain Management Facility    Patient type: Established    Primary Reason(s) for Visit: Encounter for prescription drug management. (Level of risk: moderate)  CC: Abdominal Pain (left thoracic); Headache; and Shoulder Pain (worse on the right)  HPI  Kari Gibbs is a 67 y.o. year old, female patient, who comes today for a medication management evaluation. 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; S/P laparoscopic cholecystectomy Oct 2014; Fracture of rib of left side; Long term current use of opiate analgesic; Long term prescription opiate use; Opiate use (60 MME/day); Encounter for therapeutic drug level monitoring; Opiate dependence ([place]); Encounter for chronic pain management; Chronic pain syndrome; Chronic flank pain (Location of Primary Source of Pain) (Left); Chronic thoracic radicular pain (Left-sided); Opioid-induced constipation (OIC); Chronic occipital neuralgia (Left); Neuropathic pain; Neurogenic pain; History of rib fracture (Left 4th and 8th rib); Vertebral body hemangioma (T11); Metatarsalgia of right foot; Chronic abdominal pain (Bilateral) (L>R) (possible left-sided thoracic radiculopathy); Osteoarthritis; Return to work evaluation (FCE: Medium); Varicose veins of bilateral lower extremities with other complications; Varicose veins of left lower extremity with complications; Chronic neck pain (Bilateral) (L>R); Cervical facet syndrome (Bilateral) (L>R); Muscle spasticity; Tremors of nervous system; Muscle twitching; Cervical spondylosis; Chronic bilateral low back pain without sciatica; Acute  costochondritis (Left); and Morbid obesity ([place]) on their problem list. Her primarily concern today is the Abdominal Pain (left thoracic); Headache; and Shoulder Pain (worse on the right)  Pain Assessment: Location: Mid, Left Abdomen(see visit info for additional sites) Radiating: around to the back Onset: More than a month ago Duration: Chronic pain Quality: Dull, Throbbing, Spasm, Sore Severity: 5 /10 (self-reported pain score)  Note: Reported level is compatible with observation.                          Effect on ADL: stops her in her tracks.  has to wait it out when the pain hits.  Timing: Constant Modifying factors: sitting down to rest, laying on right side, getting warm will calm it down.   Kari Gibbs was last scheduled for an appointment on 06/26/2017 for medication management. During today's appointment we reviewed Kari Gibbs's chronic pain status, as well as her outpatient medication regimen. She is concern about a cough. She states that the pain has increased on her left side. She feels like it may be related to the cold weather or her medication.   The patient  reports that she does not use drugs. Her body mass index is 33.09 kg/m.  Further details on both, my assessment(s), as well as the proposed treatment plan, please see below.  Controlled Substance Pharmacotherapy Assessment REMS (Risk Evaluation and Mitigation Strategy)  Analgesic:MS Contin 30 mg every 12 hours (60 mg/day) MME/day:60 mg/day    Janett Billow, RN  09/25/2017  9:18 AM  Sign at close encounter Nursing Pain Medication Assessment:  Safety precautions to be maintained throughout the outpatient stay will include: orient to surroundings, keep bed in low position, maintain call bell within reach at all times, provide assistance with transfer out of bed and ambulation.  Medication Inspection Compliance: Pill count conducted under  aseptic conditions, in front of the patient. Neither the pills nor  the bottle was removed from the patient's sight at any time. Once count was completed pills were immediately returned to the patient in their original bottle.  Medication: Morphine ER (MSContin) Pill/Patch Count: 3 of 60 pills remain Pill/Patch Appearance: Markings consistent with prescribed medication Bottle Appearance: Standard pharmacy container. Clearly labeled. Filled Date: 01 / 17 / 2019 Last Medication intake:  Today   Pharmacokinetics: Liberation and absorption (onset of action): WNL Distribution (time to peak effect): WNL Metabolism and excretion (duration of action): WNL         Pharmacodynamics: Desired effects: Analgesia: Kari Gibbs reports >50% benefit. Functional ability: Patient reports that medication allows her to accomplish basic ADLs Clinically meaningful improvement in function (CMIF): Sustained CMIF goals met Perceived effectiveness: Described as relatively effective, allowing for increase in activities of daily living (ADL) Undesirable effects: Side-effects or Adverse reactions: None reported Monitoring: [place] PMP: Online review of the past 81-monthperiod conducted. Compliant with practice rules and regulations Last UDS on record: Summary  Date Value Ref Range Status  12/11/2016 FINAL  Final    Comment:    ==================================================================== TOXASSURE SELECT 13 (MW) ==================================================================== Test                             Result       Flag       Units Drug Present and Declared for Prescription Verification   Morphine                       >5155        EXPECTED   ng/mg creat    Potential sources of large amounts of morphine in the absence of    codeine include administration of morphine or use of heroin.   Hydromorphone                  207          EXPECTED   ng/mg creat    Hydromorphone may be present as a metabolite of morphine;    concentrations of hydromorphone rarely exceed  5% of the morphine    concentration when this is the source of hydromorphone. ==================================================================== Test                      Result    Flag   Units      Ref Range   Creatinine              194              mg/dL      >=20 ==================================================================== Declared Medications:  The flagging and interpretation on this report are based on the  following declared medications.  Unexpected results may arise from  inaccuracies in the declared medications.  **Note: The testing scope of this panel includes these medications:  Morphine (MS Contin)  **Note: The testing scope of this panel does not include following  reported medications:  Buspirone (BuSpar)  Estrogen (Premarin)  Gabapentin  Iron  Magnesium  Metronidazole  Multivitamin (Prenatal Vit)  Naloxone (Narcan)  Omeprazole  Potassium  Supplement  Supplement (Probiotic)  Teriparatide  Triamcinolone (Kenalog)  Valsartan  Venlafaxine  Vitamin D ==================================================================== For clinical consultation, please call ([phone] ====================================================================    UDS interpretation: Compliant          Medication Assessment Form: Reviewed. Patient  indicates being compliant with therapy Treatment compliance: Compliant Risk Assessment Profile: Aberrant behavior: See prior evaluations. None observed or detected today Comorbid factors increasing risk of overdose: See prior notes. No additional risks detected today Risk of substance use disorder (SUD): Low Opioid Risk Tool - 09/25/17 0913      Family History of Substance Abuse   Alcohol  Negative    Illegal Drugs  Negative    Rx Drugs  Negative      Personal History of Substance Abuse   Alcohol  Negative    Illegal Drugs  Negative    Rx Drugs  Negative      Psychological Disease   Psychological Disease  Positive     ADD  Negative    OCD  Negative    Bipolar  Negative    Schizophrenia  Negative    Depression  Positive takes effexor and sees a psychologist for anxiety issues.    takes Brewing technologist and sees a psychologist for anxiety issues.      Total Score   Opioid Risk Tool Scoring  3    Opioid Risk Interpretation  Low Risk      ORT Scoring interpretation table:  Score <3 = Low Risk for SUD  Score between 4-7 = Moderate Risk for SUD  Score >8 = High Risk for Opioid Abuse   Risk Mitigation Strategies:  Patient Counseling: Covered Patient-Prescriber Agreement (PPA): Present and active  Notification to other healthcare providers: Done  Pharmacologic Plan: No change in therapy, at this time.             Laboratory Chemistry  Inflammation Markers (CRP: Acute Phase) (ESR: Chronic Phase) Lab Results  Component Value Date   CRP <0.5 01/04/2016   ESRSEDRATE 17 01/04/2016                         Rheumatology Markers No results found for: Elayne Guerin, [facility]              Renal Function Markers Lab Results  Component Value Date   BUN 20 01/04/2016   CREATININE 0.76 01/04/2016   GFRAA >60 01/04/2016   GFRNONAA >60 01/04/2016                 Hepatic Function Markers Lab Results  Component Value Date   AST 33 01/04/2016   ALT 26 01/04/2016   ALBUMIN 4.2 01/04/2016   ALKPHOS 97 01/04/2016   AMYLASE 30 02/27/2010   LIPASE 22 10/12/2012                 Electrolytes Lab Results  Component Value Date   NA 139 01/04/2016   K 4.6 01/04/2016   CL 99 (L) 01/04/2016   CALCIUM 9.7 01/04/2016   MG 2.1 01/04/2016                        Neuropathy Markers Lab Results  Component Value Date   VITAMINB12 321 01/04/2016                 Bone Pathology Markers Lab Results  Component Value Date   25OHVITD1 49 01/04/2016   25OHVITD2 25 01/04/2016   25OHVITD3 24 01/04/2016                         Coagulation Parameters Lab Results  Component Value Date    PLT 238 11/18/2014   DDIMER  02/06/2010    <0.22        AT THE INHOUSE ESTABLISHED CUTOFF VALUE OF 0.48 ug/mL FEU, THIS ASSAY HAS BEEN DOCUMENTED IN THE LITERATURE TO HAVE A SENSITIVITY AND NEGATIVE PREDICTIVE VALUE OF AT LEAST 98 TO 99%.  THE TEST RESULT SHOULD BE CORRELATED WITH AN ASSESSMENT OF THE CLINICAL PROBABILITY OF DVT / VTE.                 Cardiovascular Markers Lab Results  Component Value Date   CKTOTAL 39 02/06/2010   CKMB 0.4 02/06/2010   TROPONINI 0.01        NO INDICATION OF MYOCARDIAL INJURY. 02/06/2010   HGB 11.1 (L) 11/18/2014   HCT 34.1 (L) 11/18/2014                 CA Markers No results found for: CEA, CA125, LABCA2               Note: Lab results reviewed.  Recent Diagnostic Imaging Results   Complexity Note: Imaging results reviewed. Results shared with Kari Gibbs, using Layman's terms.                         Meds   Current Outpatient Medications:  .  busPIRone (BUSPAR) 15 MG tablet, TAKE 1/3 TO 1/2 TAB AS NEEDED FOR ANXIETY UP TO TWICE A DAY, Disp: , Rfl: 3 .  Cholecalciferol (VITAMIN D3) LIQD, Take 3 drops daily by mouth., Disp: , Rfl:  .  conjugated estrogens (PREMARIN) vaginal cream, Premarin 0.625 mg/gram vaginal cream  INSERT 1GRAM VAGINALLY 3 TIMES WEEKLY AT BEDTIME, Disp: , Rfl:  .  irbesartan (AVAPRO) 75 MG tablet, Take 75 mg by mouth daily., Disp: , Rfl:  .  Magnesium 500 MG TABS, Take 1 tablet by mouth 2 (two) times daily., Disp: , Rfl:  .  naloxone (NARCAN) 2 MG/2ML injection, Inject content of syringe into thigh muscle. Call 911., Disp: 2 Syringe, Rfl: 1 .  omeprazole (PRILOSEC) 20 MG capsule, Take 20 mg by mouth 2 (two) times daily before a meal., Disp: , Rfl:  .  PRENATAL VIT-FE FUM-FA-OMEGA PO, Take 1 tablet by mouth daily., Disp: , Rfl:  .  Probiotic Product (ALIGN) 4 MG CAPS, Take 1 capsule by mouth at bedtime. Per Pt., Disp: , Rfl:  .  venlafaxine XR (EFFEXOR-XR) 75 MG 24 hr capsule, Take 225 mg by mouth every morning.,  Disp: , Rfl:  .  vitamin B-12 (CYANOCOBALAMIN) 500 MCG tablet, Take 500 mcg by mouth 3 (three) times a week., Disp: , Rfl:  .  Wheat Dextrin (BENEFIBER) POWD, Stir 2 teaspoons of Benefiber into 4-8 oz of any non-carbonated beverage or soft food (hot or cold) TID., Disp: 500 g, Rfl: PRN .  [START ON 09/27/2017] gabapentin (NEURONTIN) 300 MG capsule, Take 3 capsules (900 mg total) by mouth 2 (two) times daily. May go up to 3 capsules 4 times a day. Titrate up slowly., Disp: 180 capsule, Rfl: 2 .  [START ON 11/26/2017] morphine (MS CONTIN) 30 MG 12 hr tablet, Take 1 tablet (30 mg total) by mouth every 12 (twelve) hours., Disp: 60 tablet, Rfl: 0 .  [START ON 10/27/2017] morphine (MS CONTIN) 30 MG 12 hr tablet, Take 1 tablet (30 mg total) by mouth every 12 (twelve) hours., Disp: 60 tablet, Rfl: 0 .  [START ON 09/27/2017] morphine (MS CONTIN) 30 MG 12 hr tablet, Take 1 tablet (30 mg total) by mouth every 12 (twelve)  hours., Disp: 60 tablet, Rfl: 0  ROS  Constitutional: Denies any fever or chills Gastrointestinal: No reported hemesis, hematochezia, vomiting, or acute GI distress Musculoskeletal: Denies any acute onset joint swelling, redness, loss of ROM, or weakness Neurological: No reported episodes of acute onset apraxia, aphasia, dysarthria, agnosia, amnesia, paralysis, loss of coordination, or loss of consciousness  Allergies  Kari Gibbs is allergic to iohexol; ace inhibitors; losartan potassium; and neosporin [neomycin-bacitracin zn-polymyx].  [place]  Drug: Kari Gibbs  reports that she does not use drugs. Alcohol:  reports that she does not drink alcohol. Tobacco:  reports that  has never smoked. she has never used smokeless tobacco. 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 Family: family history includes Alzheimer's disease in her father; Hypertension in her mother.  Constitutional Exam  General appearance: Well nourished, well developed, and well hydrated. In no apparent acute distress Vitals:   09/25/17 0901  BP: (!) 145/62  Pulse: 72  Resp: 16  Temp: 97.7 F (36.5 C)  TempSrc: Oral  SpO2: 100%  Weight: 205 lb (93 kg)  Height: '5\' 6"'$  (1.676 m)  Psych/Mental status: Alert, oriented x 3 (person, place, & time)       Eyes: PERLA Respiratory: No evidence of acute respiratory distress  Cervical Spine Area Exam  Skin & Axial Inspection: No masses, redness, edema, swelling, or associated skin lesions Alignment: Symmetrical Functional ROM: Unrestricted ROM      Stability: No instability detected Muscle Tone/Strength: Functionally intact. No obvious neuro-muscular anomalies detected. Sensory (Neurological): Unimpaired Palpation: No palpable anomalies              Upper Extremity (UE) Exam    Side: Right upper extremity  Side: Left upper extremity  Skin & Extremity Inspection: Skin color, temperature, and hair growth are WNL. No peripheral edema or cyanosis. No masses, redness, swelling, asymmetry, or associated skin lesions. No contractures.  Skin & Extremity Inspection: Skin color, temperature, and hair growth are WNL. No peripheral edema or cyanosis. No masses, redness, swelling, asymmetry, or  associated skin lesions. No contractures.  Functional ROM: Unrestricted ROM          Functional ROM: Unrestricted ROM          Muscle Tone/Strength: Functionally intact. No obvious neuro-muscular anomalies detected.  Muscle Tone/Strength: Functionally intact. No obvious neuro-muscular anomalies detected.  Sensory (Neurological): Unimpaired          Sensory (Neurological): Unimpaired          Palpation: No palpable anomalies              Palpation: No palpable anomalies              Specialized Test(s): Deferred         Specialized Test(s): Deferred          Thoracic Spine Area Exam  Skin & Axial Inspection: No masses, redness, or swelling Alignment: Symmetrical Functional ROM:  Unrestricted ROM Stability: No instability detected Muscle Tone/Strength: Functionally intact. No obvious neuro-muscular anomalies detected. Sensory (Neurological): Unimpaired Muscle strength & Tone: No palpable anomalies  Lumbar Spine Area Exam  Skin & Axial Inspection: No masses, redness, or swelling Alignment: Symmetrical Functional ROM: Unrestricted ROM      Stability: No instability detected Muscle Tone/Strength: Functionally intact. No obvious neuro-muscular anomalies detected. Sensory (Neurological): Unimpaired Palpation: No palpable anomalies       Provocative Tests: Lumbar Hyperextension and rotation test: evaluation deferred today       Lumbar Lateral bending test: evaluation deferred today       Patrick's Maneuver: evaluation deferred today                    Gait & Posture Assessment  Ambulation: Unassisted Gait: Relatively normal for age and body habitus Posture: WNL   Lower Extremity Exam    Side: Right lower extremity  Side: Left lower extremity  Skin & Extremity Inspection: Skin color, temperature, and hair growth are WNL. No peripheral edema or cyanosis. No masses, redness, swelling, asymmetry, or associated skin lesions. No contractures.  Skin & Extremity Inspection: Skin color,  temperature, and hair growth are WNL. No peripheral edema or cyanosis. No masses, redness, swelling, asymmetry, or associated skin lesions. No contractures.  Functional ROM: Unrestricted ROM          Functional ROM: Unrestricted ROM          Muscle Tone/Strength: Functionally intact. No obvious neuro-muscular anomalies detected.  Muscle Tone/Strength: Functionally intact. No obvious neuro-muscular anomalies detected.  Sensory (Neurological): Unimpaired  Sensory (Neurological): Unimpaired  Palpation: No palpable anomalies  Palpation: No palpable anomalies   Assessment  Primary Diagnosis & Pertinent Problem List: The primary encounter diagnosis was Chronic abdominal pain (Bilateral) (L>R) (possible left-sided thoracic radiculopathy). Diagnoses of Chronic flank pain (Location of Primary Source of Pain) (Left), Chronic occipital neuralgia (Left), Chronic thoracic radicular pain (Left-sided), Neurogenic pain, Chronic pain syndrome, and Long term current use of opiate analgesic were also pertinent to this visit.  Status Diagnosis  Recurring Recurring Controlled 1. Chronic abdominal pain (Bilateral) (L>R) (possible left-sided thoracic radiculopathy)   2. Chronic flank pain (Location of Primary Source of Pain) (Left)   3. Chronic occipital neuralgia (Left)   4. Chronic thoracic radicular pain (Left-sided)   5. Neurogenic pain   6. Chronic pain syndrome   7. Long term current use of opiate analgesic     Problems updated and reviewed during this visit: No problems updated. Plan of Care  Pharmacotherapy (Medications Ordered): Meds ordered this encounter  Medications  . morphine (MS CONTIN) 30 MG 12 hr tablet    Sig: Take 1 tablet (30 mg total) by mouth every 12 (twelve) hours.    Dispense:  60 tablet    Refill:  0    Do not place this medication, or any other prescription from our practice, on "Automatic Refill". Patient may have prescription filled one day early if pharmacy is closed on  scheduled refill date. Do not fill until: 11/26/2017 To last until: 12/26/2017    Order Specific Question:   Supervising Provider    Answer:   [name] [phone]  . morphine (MS CONTIN) 30 MG 12 hr tablet    Sig: Take 1 tablet (30 mg total) by mouth every 12 (twelve) hours.    Dispense:  60 tablet    Refill:  0    Do not place this medication, or any other prescription from our practice,  on "Automatic Refill". Patient may have prescription filled one day early if pharmacy is closed on scheduled refill date. Do not fill until: 10/27/2017 To last until: 11/26/2017    Order Specific Question:   Supervising Provider    Answer:   [name] [phone]  . morphine (MS CONTIN) 30 MG 12 hr tablet    Sig: Take 1 tablet (30 mg total) by mouth every 12 (twelve) hours.    Dispense:  60 tablet    Refill:  0    Do not place this medication, or any other prescription from our practice, on "Automatic Refill". Patient may have prescription filled one day early if pharmacy is closed on scheduled refill date. Do not fill until: 09/27/2017 To last until: 10/27/2017    Order Specific Question:   Supervising Provider    Answer:   [name] [phone]  . gabapentin (NEURONTIN) 300 MG capsule    Sig: Take 3 capsules (900 mg total) by mouth 2 (two) times daily. May go up to 3 capsules 4 times a day. Titrate up slowly.    Dispense:  180 capsule    Refill:  2    Do not place this medication, or any other prescription from our practice, on "Automatic Refill". Patient may have prescription filled one day early if pharmacy is closed on scheduled refill date.    Order Specific Question:   Supervising Provider    Answer:   [name] [[id number]]   New Prescriptions   No medications on file   Medications administered today: Kari Gibbs had no medications administered during this visit. Lab-work, procedure(s), and/or referral(s): Orders Placed This Encounter  Procedures  . ToxASSURE  Select 13 (MW), Urine   Imaging and/or referral(s): None  Interventional therapies: Planned, scheduled, and/or pending: Not at this time. She can call for nerve block when needed.   Considering: Left greater occipital nerve RFA  Diagnostic left C2 + TON nerve block  Possible left C2 + TON   Palliative PRN treatment(s): Left greater occipital nerve RFA      Provider-requested follow-up: Return in about 3 months (around 12/23/2017) for MedMgmt with Me Donella Stade Edison Pace).  Future Appointments  Date Time Provider [place]  12/08/2017  1:00 PM Mal Misty, MD [facility]  12/23/2017  9:00 AM Vevelyn Francois, NP [facility] None   Primary Care Physician: Cari Caraway, MD Location: [facility] Note by: Vevelyn Francois NP Date: 09/25/2017; Time: 11:21 AM  Pain Score Disclaimer: We use the NRS-11 scale. This is a self-reported, subjective measurement of pain severity with only modest accuracy. It is used primarily to identify changes within a particular patient. It must be understood that outpatient pain scales are significantly less accurate that those used for research, where they can be applied under ideal controlled circumstances with minimal exposure to variables. In reality, the score is likely to be a combination of pain intensity and pain affect, where pain affect describes the degree of emotional arousal or changes in action readiness caused by the sensory experience of pain. Factors such as social and work situation, setting, emotional state, anxiety levels, expectation, and prior pain experience may influence pain perception and show large inter-individual differences that may also be affected by time variables.  Patient instructions provided during this appointment: Patient Instructions    ____________________________________________________________________________________________  Medication Rules  Applies to: All patients  receiving prescriptions (written or electronic).  Pharmacy of record: Pharmacy where electronic prescriptions will be sent. If written  prescriptions are taken to a different pharmacy, please inform the nursing staff. The pharmacy listed in the electronic medical record should be the one where you would like electronic prescriptions to be sent.  Prescription refills: Only during scheduled appointments. Applies to both, written and electronic prescriptions.  NOTE: The following applies primarily to controlled substances (Opioid* Pain Medications).   Patient's responsibilities: 1. Pain Pills: Bring all pain pills to every appointment (except for procedure appointments). 2. Pill Bottles: Bring pills in original pharmacy bottle. Always bring newest bottle. Bring bottle, even if empty. 3. Medication refills: You are responsible for knowing and keeping track of what medications you need refilled. The day before your appointment, write a list of all prescriptions that need to be refilled. Bring that list to your appointment and give it to the admitting nurse. Prescriptions will be written only during appointments. If you forget a medication, it will not be "Called in", "Faxed", or "electronically sent". You will need to get another appointment to get these prescribed. 4. Prescription Accuracy: You are responsible for carefully inspecting your prescriptions before leaving our office. Have the discharge nurse carefully go over each prescription with you, before taking them home. Make sure that your name is accurately spelled, that your address is correct. Check the name and dose of your medication to make sure it is accurate. Check the number of pills, and the written instructions to make sure they are clear and accurate. Make sure that you are given enough medication to last until your next medication refill appointment. 5. Taking Medication: Take medication as prescribed. Never take more pills than instructed.  Never take medication more frequently than prescribed. Taking less pills or less frequently is permitted and encouraged, when it comes to controlled substances (written prescriptions).  6. Inform other Doctors: Always inform, all of your healthcare providers, of all the medications you take. 7. Pain Medication from other Providers: You are not allowed to accept any additional pain medication from any other Doctor or Healthcare provider. There are two exceptions to this rule. (see below) In the event that you require additional pain medication, you are responsible for notifying us, as stated below. 8. Medication Agreement: You are responsible for carefully reading and following our Medication Agreement. This must be signed before receiving any prescriptions from our practice. Safely store a copy of your signed Agreement. Violations to the Agreement will result in no further prescriptions. (Additional copies of our Medication Agreement are available upon request.) 9. Laws, Rules, & Regulations: All patients are expected to follow all Federal and [employer], [employer], Rules, [employer]. Ignorance of the Laws does not constitute a valid excuse. The use of any illegal substances is prohibited. 10. Adopted CDC guidelines & recommendations: Target dosing levels will be at or below 60 MME/day. Use of benzodiazepines** is not recommended.  Exceptions: There are only two exceptions to the rule of not receiving pain medications from other Healthcare Providers. 1. Exception #1 (Emergencies): In the event of an emergency (i.e.: accident requiring emergency care), you are allowed to receive additional pain medication. However, you are responsible for: As soon as you are able, call our office [phone], at any time of the day or night, and leave a message stating your name, the date and nature of the emergency, and the name and dose of the medication prescribed. In the event that your call is answered by a member  of our staff, make sure to document and save the date, time, and the name  of the person that took your information.  2. Exception #2 (Planned Surgery): In the event that you are scheduled by another doctor or dentist to have any type of surgery or procedure, you are allowed (for a period no longer than 30 days), to receive additional pain medication, for the acute post-op pain. However, in this case, you are responsible for picking up a copy of our "Post-op Pain Management for Surgeons" handout, and giving it to your surgeon or dentist. This document is available at our office, and does not require an appointment to obtain it. Simply go to our office during business hours (Monday-Thursday from 8:00 AM to 4:00 PM) (Friday 8:00 AM to 12:00 Noon) or if you have a scheduled appointment with [place], prior to your surgery, and ask for it by name. In addition, you will need to provide [place] with your name, name of your surgeon, type of surgery, and date of procedure or surgery.  *Opioid medications include: morphine, codeine, oxycodone, oxymorphone, hydrocodone, hydromorphone, meperidine, tramadol, tapentadol, buprenorphine, fentanyl, methadone. **Benzodiazepine medications include: diazepam (Valium), alprazolam (Xanax), clonazepam (Klonopine), lorazepam (Ativan), clorazepate (Tranxene), chlordiazepoxide (Librium), estazolam (Prosom), oxazepam (Serax), temazepam (Restoril), triazolam (Halcion)  ____________________________________________________________________________________________   BMI Assessment: Estimated body mass index is 33.09 kg/m as calculated from the following:   Height as of this encounter: '5\' 6"'$  (1.676 m).   Weight as of this encounter: 205 lb (93 kg).  BMI interpretation table: BMI level Category Range association with higher incidence of chronic pain  <18 kg/m2 Underweight   18.5-24.9 kg/m2 Ideal body weight   25-29.9 kg/m2 Overweight Increased incidence by 20%  30-34.9 kg/m2 Obese (Class I)  Increased incidence by 68%  35-39.9 kg/m2 Severe obesity (Class II) Increased incidence by 136%  >40 kg/m2 Extreme obesity (Class III) Increased incidence by 254%   BMI Readings from Last 4 Encounters:  09/25/17 33.09 kg/m  09/02/17 34.16 kg/m  07/01/17 33.09 kg/m  06/26/17 33.09 kg/m   Wt Readings from Last 4 Encounters:  09/25/17 205 lb (93 kg)  09/02/17 205 lb 4.8 oz (93.1 kg)  07/01/17 205 lb (93 kg)  06/26/17 205 lb (93 kg)   You were given 3 prescriptions for MS Contin today. A prescription for Gabapentin was sent to your pharmacy.

## 2017-09-25 NOTE — Progress Notes (Signed)
Nursing Pain Medication Assessment:  Safety precautions to be maintained throughout the outpatient stay will include: orient to surroundings, keep bed in low position, maintain call bell within reach at all times, provide assistance with transfer out of bed and ambulation.  Medication Inspection Compliance: Pill count conducted under aseptic conditions, in front of the patient. Neither the pills nor the bottle was removed from the patient's sight at any time. Once count was completed pills were immediately returned to the patient in their original bottle.  Medication: Morphine ER (MSContin) Pill/Patch Count: 3 of 60 pills remain Pill/Patch Appearance: Markings consistent with prescribed medication Bottle Appearance: Standard pharmacy container. Clearly labeled. Filled Date: 01 / 17 / 2019 Last Medication intake:  Today

## 2017-09-25 NOTE — Patient Instructions (Addendum)
____________________________________________________________________________________________  Medication Rules  Applies to: All patients receiving prescriptions (written or electronic).  Pharmacy of record: Pharmacy where electronic prescriptions will be sent. If written prescriptions are taken to a different pharmacy, please inform the nursing staff. The pharmacy listed in the electronic medical record should be the one where you would like electronic prescriptions to be sent.  Prescription refills: Only during scheduled appointments. Applies to both, written and electronic prescriptions.  NOTE: The following applies primarily to controlled substances (Opioid* Pain Medications).   Patient's responsibilities: 1. Pain Pills: Bring all pain pills to every appointment (except for procedure appointments). 2. Pill Bottles: Bring pills in original pharmacy bottle. Always bring newest bottle. Bring bottle, even if empty. 3. Medication refills: You are responsible for knowing and keeping track of what medications you need refilled. The day before your appointment, write a list of all prescriptions that need to be refilled. Bring that list to your appointment and give it to the admitting nurse. Prescriptions will be written only during appointments. If you forget a medication, it will not be "Called in", "Faxed", or "electronically sent". You will need to get another appointment to get these prescribed. 4. Prescription Accuracy: You are responsible for carefully inspecting your prescriptions before leaving our office. Have the discharge nurse carefully go over each prescription with you, before taking them home. Make sure that your name is accurately spelled, that your address is correct. Check the name and dose of your medication to make sure it is accurate. Check the number of pills, and the written instructions to make sure they are clear and accurate. Make sure that you are given enough medication to  last until your next medication refill appointment. 5. Taking Medication: Take medication as prescribed. Never take more pills than instructed. Never take medication more frequently than prescribed. Taking less pills or less frequently is permitted and encouraged, when it comes to controlled substances (written prescriptions).  6. Inform other Doctors: Always inform, all of your healthcare providers, of all the medications you take. 7. Pain Medication from other Providers: You are not allowed to accept any additional pain medication from any other Doctor or Healthcare provider. There are two exceptions to this rule. (see below) In the event that you require additional pain medication, you are responsible for notifying us, as stated below. 8. Medication Agreement: You are responsible for carefully reading and following our Medication Agreement. This must be signed before receiving any prescriptions from our practice. Safely store a copy of your signed Agreement. Violations to the Agreement will result in no further prescriptions. (Additional copies of our Medication Agreement are available upon request.) 9. Laws, Rules, & Regulations: All patients are expected to follow all Federal and State Laws, Statutes, Rules, & Regulations. Ignorance of the Laws does not constitute a valid excuse. The use of any illegal substances is prohibited. 10. Adopted CDC guidelines & recommendations: Target dosing levels will be at or below 60 MME/day. Use of benzodiazepines** is not recommended.  Exceptions: There are only two exceptions to the rule of not receiving pain medications from other Healthcare Providers. 1. Exception #1 (Emergencies): In the event of an emergency (i.e.: accident requiring emergency care), you are allowed to receive additional pain medication. However, you are responsible for: As soon as you are able, call our office [phone], at any time of the day or night, and leave a message stating your  name, the date and nature of the emergency, and the name and dose of the medication   prescribed. In the event that your call is answered by a member of our staff, make sure to document and save the date, time, and the name of the person that took your information.  2. Exception #2 (Planned Surgery): In the event that you are scheduled by another doctor or dentist to have any type of surgery or procedure, you are allowed (for a period no longer than 30 days), to receive additional pain medication, for the acute post-op pain. However, in this case, you are responsible for picking up a copy of our "Post-op Pain Management for Surgeons" handout, and giving it to your surgeon or dentist. This document is available at our office, and does not require an appointment to obtain it. Simply go to our office during business hours (Monday-Thursday from 8:00 AM to 4:00 PM) (Friday 8:00 AM to 12:00 Noon) or if you have a scheduled appointment with [place], prior to your surgery, and ask for it by name. In addition, you will need to provide [place] with your name, name of your surgeon, type of surgery, and date of procedure or surgery.  *Opioid medications include: morphine, codeine, oxycodone, oxymorphone, hydrocodone, hydromorphone, meperidine, tramadol, tapentadol, buprenorphine, fentanyl, methadone. **Benzodiazepine medications include: diazepam (Valium), alprazolam (Xanax), clonazepam (Klonopine), lorazepam (Ativan), clorazepate (Tranxene), chlordiazepoxide (Librium), estazolam (Prosom), oxazepam (Serax), temazepam (Restoril), triazolam (Halcion)  ____________________________________________________________________________________________   BMI Assessment: Estimated body mass index is 33.09 kg/m as calculated from the following:   Height as of this encounter: 5\' 6"  (1.676 m).   Weight as of this encounter: 205 lb (93 kg).  BMI interpretation table: BMI level Category Range association with higher incidence of chronic pain   <18 kg/m2 Underweight   18.5-24.9 kg/m2 Ideal body weight   25-29.9 kg/m2 Overweight Increased incidence by 20%  30-34.9 kg/m2 Obese (Class I) Increased incidence by 68%  35-39.9 kg/m2 Severe obesity (Class II) Increased incidence by 136%  >40 kg/m2 Extreme obesity (Class III) Increased incidence by 254%   BMI Readings from Last 4 Encounters:  09/25/17 33.09 kg/m  09/02/17 34.16 kg/m  07/01/17 33.09 kg/m  06/26/17 33.09 kg/m   Wt Readings from Last 4 Encounters:  09/25/17 205 lb (93 kg)  09/02/17 205 lb 4.8 oz (93.1 kg)  07/01/17 205 lb (93 kg)  06/26/17 205 lb (93 kg)   You were given 3 prescriptions for MS Contin today. A prescription for Gabapentin was sent to your pharmacy.

## 2017-10-01 LAB — TOXASSURE SELECT 13 (MW), URINE

## 2017-10-12 ENCOUNTER — Encounter: Payer: Self-pay | Admitting: Pain Medicine

## 2017-11-19 ENCOUNTER — Telehealth: Payer: Self-pay | Admitting: Pain Medicine

## 2017-11-19 NOTE — Telephone Encounter (Signed)
Patient left vmail stating she is having to stay in [place] with daughter in law who is having emergency surgery. She will need to fill her meds at a pharmacy in [place] and will need authorization from Dr. Dossie Arbour. Please call patient to find out which pharmacy and phone number

## 2017-11-20 NOTE — Telephone Encounter (Signed)
Returned patient phone call and told her that we would approve the Rx if pharmacy calls [place] to be filled in [place].

## 2017-12-08 ENCOUNTER — Ambulatory Visit: Payer: Self-pay | Admitting: Vascular Surgery

## 2017-12-23 ENCOUNTER — Encounter: Payer: Self-pay | Admitting: Nurse Practitioner

## 2017-12-23 ENCOUNTER — Ambulatory Visit: Payer: Medicare Other | Attending: Nurse Practitioner | Admitting: Nurse Practitioner

## 2017-12-23 ENCOUNTER — Other Ambulatory Visit: Payer: Self-pay

## 2017-12-23 VITALS — BP 108/64 | HR 75 | Temp 97.5°F | Resp 16 | Ht 66.0 in | Wt 207.0 lb

## 2017-12-23 DIAGNOSIS — F419 Anxiety disorder, unspecified: Secondary | ICD-10-CM | POA: Diagnosis not present

## 2017-12-23 DIAGNOSIS — K219 Gastro-esophageal reflux disease without esophagitis: Secondary | ICD-10-CM | POA: Insufficient documentation

## 2017-12-23 DIAGNOSIS — M792 Neuralgia and neuritis, unspecified: Secondary | ICD-10-CM

## 2017-12-23 DIAGNOSIS — M940 Chondrocostal junction syndrome [Tietze]: Secondary | ICD-10-CM | POA: Insufficient documentation

## 2017-12-23 DIAGNOSIS — G894 Chronic pain syndrome: Secondary | ICD-10-CM | POA: Insufficient documentation

## 2017-12-23 DIAGNOSIS — K449 Diaphragmatic hernia without obstruction or gangrene: Secondary | ICD-10-CM | POA: Insufficient documentation

## 2017-12-23 DIAGNOSIS — G629 Polyneuropathy, unspecified: Secondary | ICD-10-CM | POA: Diagnosis not present

## 2017-12-23 DIAGNOSIS — Z9884 Bariatric surgery status: Secondary | ICD-10-CM | POA: Diagnosis not present

## 2017-12-23 DIAGNOSIS — Z90710 Acquired absence of both cervix and uterus: Secondary | ICD-10-CM | POA: Diagnosis not present

## 2017-12-23 DIAGNOSIS — G8929 Other chronic pain: Secondary | ICD-10-CM

## 2017-12-23 DIAGNOSIS — F1190 Opioid use, unspecified, uncomplicated: Secondary | ICD-10-CM

## 2017-12-23 DIAGNOSIS — Z79891 Long term (current) use of opiate analgesic: Secondary | ICD-10-CM

## 2017-12-23 DIAGNOSIS — Z79899 Other long term (current) drug therapy: Secondary | ICD-10-CM | POA: Insufficient documentation

## 2017-12-23 DIAGNOSIS — R109 Unspecified abdominal pain: Secondary | ICD-10-CM | POA: Insufficient documentation

## 2017-12-23 DIAGNOSIS — F329 Major depressive disorder, single episode, unspecified: Secondary | ICD-10-CM | POA: Diagnosis not present

## 2017-12-23 DIAGNOSIS — Z6833 Body mass index (BMI) 33.0-33.9, adult: Secondary | ICD-10-CM | POA: Insufficient documentation

## 2017-12-23 DIAGNOSIS — K589 Irritable bowel syndrome without diarrhea: Secondary | ICD-10-CM | POA: Diagnosis not present

## 2017-12-23 DIAGNOSIS — M47892 Other spondylosis, cervical region: Secondary | ICD-10-CM | POA: Diagnosis not present

## 2017-12-23 DIAGNOSIS — M1990 Unspecified osteoarthritis, unspecified site: Secondary | ICD-10-CM | POA: Diagnosis not present

## 2017-12-23 DIAGNOSIS — Z9049 Acquired absence of other specified parts of digestive tract: Secondary | ICD-10-CM | POA: Insufficient documentation

## 2017-12-23 DIAGNOSIS — R1011 Right upper quadrant pain: Secondary | ICD-10-CM

## 2017-12-23 DIAGNOSIS — I10 Essential (primary) hypertension: Secondary | ICD-10-CM | POA: Insufficient documentation

## 2017-12-23 DIAGNOSIS — R1012 Left upper quadrant pain: Secondary | ICD-10-CM

## 2017-12-23 DIAGNOSIS — M47812 Spondylosis without myelopathy or radiculopathy, cervical region: Secondary | ICD-10-CM

## 2017-12-23 DIAGNOSIS — Z87442 Personal history of urinary calculi: Secondary | ICD-10-CM | POA: Diagnosis not present

## 2017-12-23 DIAGNOSIS — Z5181 Encounter for therapeutic drug level monitoring: Secondary | ICD-10-CM | POA: Diagnosis not present

## 2017-12-23 DIAGNOSIS — M5481 Occipital neuralgia: Secondary | ICD-10-CM | POA: Insufficient documentation

## 2017-12-23 MED ORDER — MORPHINE SULFATE ER 30 MG PO TBCR: 30.0000 mg | EXTENDED_RELEASE_TABLET | Freq: Two times a day (BID) | ORAL | 0 refills | Status: DC

## 2017-12-23 MED ORDER — NALOXONE HCL 2 MG/2ML IJ SOSY: PREFILLED_SYRINGE | INTRAMUSCULAR | 1 refills | Status: DC

## 2017-12-23 NOTE — Progress Notes (Signed)
Patient's Name: Kari Gibbs  MRN: [id number]  Referring Provider: Cari Caraway, MD  DOB: 29-Oct-1950  PCP: Cari Caraway, MD  DOS: 12/23/2017  Note by: Vevelyn Francois NP  Service setting: Ambulatory outpatient  Specialty: Interventional Pain Management  Location: [facility] (AMB) Pain Management Facility    Patient type: Established    Primary Reason(s) for Visit: Encounter for prescription drug management. (Level of risk: moderate)  CC: Abdominal Pain (right )  HPI  Kari Gibbs is a 67 y.o. year old, female patient, who comes today for a medication management evaluation. 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; S/P laparoscopic cholecystectomy Oct 2014; Fracture of rib of left side; Long term current use of opiate analgesic; Long term prescription opiate use; Opiate use (60 MME/day); Encounter for therapeutic drug level monitoring; Opiate dependence ([place]); Encounter for chronic pain management; Chronic pain syndrome; Chronic flank pain (Location of Primary Source of Pain) (Left); Chronic thoracic radicular pain (Left-sided); Opioid-induced constipation (OIC); Chronic occipital neuralgia (Left); Neuropathic pain; Neurogenic pain; History of rib fracture (Left 4th and 8th rib); Vertebral body hemangioma (T11); Metatarsalgia of right foot; Chronic abdominal pain (Bilateral) (L>R) (possible left-sided thoracic radiculopathy); Osteoarthritis; Return to work evaluation (FCE: Medium); Varicose veins of bilateral lower extremities with other complications; Varicose veins of left lower extremity with complications; Chronic neck pain (Bilateral) (L>R); Cervical facet syndrome (Bilateral) (L>R); Muscle spasticity; Tremors of nervous system; Muscle twitching; Cervical spondylosis; Chronic bilateral low back pain without sciatica; Acute costochondritis (Left); and Morbid obesity (HCC) on their  problem list. Her primarily concern today is the Abdominal Pain (right )  Pain Assessment: Location: Right Abdomen Onset: More than a month ago Duration: Chronic pain Quality: Aching, Pounding, Sharp, Dull, Discomfort, Tender("my whole rib cage hurts all the time") Severity: 3 /10 (subjective, self-reported pain score)  Note: Reported level is compatible with observation.                          Effect on ADL: bending, lifting, twisting Modifying factors: medication, heat BP: 108/64  HR: 75  Kari Gibbs was last scheduled for an appointment on 09/25/2017 for medication management. During today's appointment we reviewed Kari Gibbs's chronic pain status, as well as her outpatient medication regimen. She states that her previous NB was not effective for her pain. She wonders what else can be done for her pain. She admits that it does radates up and down on her left side. She is not taking any medication for constipation. She was to have colonoscopy however she was not cleaned out properly so the colonoscopy was not completed. She is going to have this repeated however she has not called to have this completed. She admits that since then she has been having regular bowel movements but do not feel like she has completely cleaned out.   The patient  reports that she does not use drugs. Her body mass index is 33.41 kg/m.  Further details on both, my assessment(s), as well as the proposed treatment plan, please see below.  Controlled Substance Pharmacotherapy Assessment REMS (Risk Evaluation and Mitigation Strategy)  Analgesic:MS Contin 30 mg every 12 hours (60 mg/day) MME/day:60 mg/day   Ignatius Specking, RN  12/23/2017  9:25 AM  Sign at close encounter Nursing Pain Medication Assessment:  Safety precautions to be maintained throughout the outpatient stay will include: orient to surroundings, keep bed in  low position, maintain call bell within reach at all times, provide assistance with  transfer out of bed and ambulation.  Medication Inspection Compliance: Pill count conducted under aseptic conditions, in front of the patient. Neither the pills nor the bottle was removed from the patient's sight at any time. Once count was completed pills were immediately returned to the patient in their original bottle.  Medication: See above Pill/Patch Count: 5 of 60 pills remain Pill/Patch Appearance: Markings consistent with prescribed medication Bottle Appearance: Standard pharmacy container. Clearly labeled. Filled Date: 4 / 17 / 2019 Last Medication intake:  Today   Pharmacokinetics: Liberation and absorption (onset of action): WNL Distribution (time to peak effect): WNL Metabolism and excretion (duration of action): WNL         Pharmacodynamics: Desired effects: Analgesia: Ms. Kuchera reports >50% benefit. Functional ability: Patient reports that medication allows her to accomplish basic ADLs Clinically meaningful improvement in function (CMIF): Sustained CMIF goals met Perceived effectiveness: Described as relatively effective, allowing for increase in activities of daily living (ADL) Undesirable effects: Side-effects or Adverse reactions: None reported Monitoring: [place] PMP: Online review of the past 22-monthperiod conducted. Compliant with practice rules and regulations Last UDS on record: Summary  Date Value Ref Range Status  09/25/2017 FINAL  Final    Comment:    ==================================================================== TOXASSURE SELECT 13 (MW) ==================================================================== Test                             Result       Flag       Units Drug Present and Declared for Prescription Verification   Morphine                       3259         EXPECTED   ng/mg creat   Normorphine                    303          EXPECTED   ng/mg creat    Potential sources of large amounts of morphine in the absence of    codeine include  administration of morphine or use of heroin.    Normorphine is an expected metabolite of morphine.   Hydromorphone                  97           EXPECTED   ng/mg creat    Hydromorphone may be present as a metabolite of morphine;    concentrations of hydromorphone rarely exceed 5% of the morphine    concentration when this is the source of hydromorphone. ==================================================================== Test                      Result    Flag   Units      Ref Range   Creatinine              151              mg/dL      >=20 ==================================================================== Declared Medications:  The flagging and interpretation on this report are based on the  following declared medications.  Unexpected results may arise from  inaccuracies in the declared medications.  **Note: The testing scope of this panel includes these medications:  Morphine (Morphine Sulfate)  **Note: The testing scope of this panel does not include  following  reported medications:  Buspirone  Cholecalciferol  Cyanocobalamin  Estrogen (Premarin)  Gabapentin  Irbesartan  Multivitamin (Prenatal)  Naloxone  Omeprazole  Supplement  Supplement (Omega-3)  Supplement (Probiotic)  Valsartan  Venlafaxine ==================================================================== For clinical consultation, please call [phone]. ====================================================================    UDS interpretation: Compliant          Medication Assessment Form: Reviewed. Patient indicates being compliant with therapy Treatment compliance: Compliant Risk Assessment Profile: Aberrant behavior: See prior evaluations. None observed or detected today Comorbid factors increasing risk of overdose: See prior notes. No additional risks detected today Risk of substance use disorder (SUD): Low Opioid Risk Tool - 12/23/17 0923      Personal History of Substance Abuse   Alcohol   Negative  (Pended)     Illegal Drugs  Negative  (Pended)     Rx Drugs  Negative  (Pended)       Age   Age between 32-45 years   No  (Pended)       History of Preadolescent Sexual Abuse   History of Preadolescent Sexual Abuse  Negative or Female  (Pended)       Psychological Disease   Psychological Disease  Negative  (Pended)     Depression  Negative  (Pended)       Total Score   Opioid Risk Tool Scoring  0  (Pended)     Opioid Risk Interpretation  Low Risk  (Pended)       ORT Scoring interpretation table:  Score <3 = Low Risk for SUD  Score between 4-7 = Moderate Risk for SUD  Score >8 = High Risk for Opioid Abuse   Risk Mitigation Strategies:  Patient Counseling: Covered Patient-Prescriber Agreement (PPA): Present and active  Notification to other healthcare providers: Done  Pharmacologic Plan: No change in therapy, at this time.             Laboratory Chemistry  Inflammation Markers (CRP: Acute Phase) (ESR: Chronic Phase) Lab Results  Component Value Date   CRP <0.5 01/04/2016   ESRSEDRATE 17 01/04/2016                         Rheumatology Markers No results found for: Elayne Guerin, [facility]                      Renal Function Markers Lab Results  Component Value Date   BUN 20 01/04/2016   CREATININE 0.76 01/04/2016   GFRAA >60 01/04/2016   GFRNONAA >60 01/04/2016                              Hepatic Function Markers Lab Results  Component Value Date   AST 33 01/04/2016   ALT 26 01/04/2016   ALBUMIN 4.2 01/04/2016   ALKPHOS 97 01/04/2016   AMYLASE 30 02/27/2010   LIPASE 22 10/12/2012                        Electrolytes Lab Results  Component Value Date   NA 139 01/04/2016   K 4.6 01/04/2016   CL 99 (L) 01/04/2016   CALCIUM 9.7 01/04/2016   MG 2.1 01/04/2016                        Neuropathy Markers Lab Results  Component Value Date   VITAMINB12 321  01/04/2016                        Bone Pathology Markers Lab  Results  Component Value Date   25OHVITD1 49 01/04/2016   25OHVITD2 25 01/04/2016   25OHVITD3 24 01/04/2016                         Coagulation Parameters Lab Results  Component Value Date   PLT 238 11/18/2014   DDIMER  02/06/2010    <0.22        AT THE INHOUSE ESTABLISHED CUTOFF VALUE OF 0.48 ug/mL FEU, THIS ASSAY HAS BEEN DOCUMENTED IN THE LITERATURE TO HAVE A SENSITIVITY AND NEGATIVE PREDICTIVE VALUE OF AT LEAST 98 TO 99%.  THE TEST RESULT SHOULD BE CORRELATED WITH AN ASSESSMENT OF THE CLINICAL PROBABILITY OF DVT / VTE.                        Cardiovascular Markers Lab Results  Component Value Date   CKTOTAL 39 02/06/2010   CKMB 0.4 02/06/2010   TROPONINI 0.01        NO INDICATION OF MYOCARDIAL INJURY. 02/06/2010   HGB 11.1 (L) 11/18/2014   HCT 34.1 (L) 11/18/2014                         CA Markers No results found for: CEA, CA125, LABCA2                      Note: Lab results reviewed.  Recent Diagnostic Imaging Results  VAS [place] LOWER EXTREMITY VENOUS REFLUX  Lower Venous Reflux Study  Indication: Edema. Risk Factors: Surgery Previous right great saphenous vein. Examination Guidelines: A complete evaluation includes B-mode imaging, spectral doppler, color doppler, and power doppler as needed of all accessible portions of each vessel. Bilateral testing is considered an integral part of a complete examination. Limited examinations for reoccurring indications may be performed as noted. The reflux portion of the exam is performed with the patient in reverse Trendelenburg.     Venous Reflux Times Normal value < 0.5 sec +--------------+----------+---------+               Right (ms)Left (ms) +--------------+----------+---------+ CFV                     1144.2    +--------------+----------+---------+ FV                      410.7     +--------------+----------+---------+ Popliteal               2039.1     +--------------+----------+---------+ GSV at groin            872.8     +--------------+----------+---------+ GSV prox thigh          1833.7    +--------------+----------+---------+ GSV mid thigh           2046.4    +--------------+----------+---------+ GSV dist thigh          586.8     +--------------+----------+---------+ GSV at knee             44.0      +--------------+----------+---------+ GSV prox calf           0.0       +--------------+----------+---------+ GSV mid calf  44.0      +--------------+----------+---------+  Vein Diameters: +-------------------+----------+---------+                    Right (mm)Left (mm) +-------------------+----------+---------+ GSV at SFJ                   0.93      +-------------------+----------+---------+ GSV at prox thigh            0.71      +-------------------+----------+---------+ GSV at mid thigh             0.59      +-------------------+----------+---------+ GSV at distal thigh          0.62      +-------------------+----------+---------+ GSV at knee                  0.59      +-------------------+----------+---------+ GSV prox calf                0.35      +-------------------+----------+---------+ GSV mid calf                 0.32      +-------------------+----------+---------+ SSV                          0.24      +-------------------+----------+---------+    The left common femoral vein and popliteal veins are incompetent. The left great saphenous vein is incompetent. The left sapheno-femoral incompetent. The left small saphenous vein is competent.      Final Interpretation: Right: No evidence of common femoral vein obstruction. Left: Normal reflux times were detected in the femoral vein in the thigh, great saphenous vein at the knee, great saphenous vein at the proximal calf, and great saphenous vein at the mid calf. Abnormal reflux times  were noted in the common femoral vein,  popliteal vein, great saphenous vein at the groin, great saphenous vein at the proximal thigh, great saphenous vein at the mid thigh, and great saphenous vein at the distal thigh. Evidence of chronic venous insufficiency is detected in the great  saphenous vein, and deep venous system. There is no evidence of deep vein thrombosis in the lower extremity.There is no evidence of superficial venous thrombosis. No cystic structure found in the popliteal fossa.   *See table(s) above for measurements and observations.  Electronically signed by Tinnie Gens on 09/02/2017 at 2:15:34 PM.     Final  Complexity Note: Imaging results reviewed. Results shared with Kari Gibbs, using Layman's terms.                         Meds   Current Outpatient Medications:  .  busPIRone (BUSPAR) 15 MG tablet, TAKE 1/3 TO 1/2 TAB AS NEEDED FOR ANXIETY UP TO TWICE A DAY, Disp: , Rfl: 3 .  Cholecalciferol (VITAMIN D3) LIQD, Take 3 drops daily by mouth., Disp: , Rfl:  .  conjugated estrogens (PREMARIN) vaginal cream, Premarin 0.625 mg/gram vaginal cream  INSERT 1GRAM VAGINALLY 3 TIMES WEEKLY AT BEDTIME, Disp: , Rfl:  .  diclofenac (FLECTOR) 1.3 % PTCH, Place 1 patch onto the skin 2 (two) times daily., Disp: , Rfl:  .  gabapentin (NEURONTIN) 300 MG capsule, Take 3 capsules (900 mg total) by mouth 2 (two) times daily. May go up to 3 capsules 4 times a day. Titrate up slowly., Disp: 180 capsule, Rfl: 2 .  irbesartan (AVAPRO) 75 MG tablet, Take 75 mg by mouth daily., Disp: , Rfl:  .  Magnesium 500 MG TABS, Take 1 tablet by mouth 2 (two) times daily., Disp: , Rfl:  .  montelukast (SINGULAIR) 10 MG tablet, Take 10 mg by mouth at bedtime., Disp: , Rfl:  .  morphine (MS CONTIN) 30 MG 12 hr tablet, Take 1 tablet (30 mg total) by mouth every 12 (twelve) hours., Disp: 60 tablet, Rfl: 0 .  naloxone (NARCAN) 2 MG/2ML injection, Inject content of syringe into thigh muscle. Call 911., Disp: 2  Syringe, Rfl: 1 .  omeprazole (PRILOSEC) 20 MG capsule, Take 20 mg by mouth 2 (two) times daily before a meal., Disp: , Rfl:  .  PRENATAL VIT-FE FUM-FA-OMEGA PO, Take 1 tablet by mouth daily., Disp: , Rfl:  .  Probiotic Product (ALIGN) 4 MG CAPS, Take 1 capsule by mouth at bedtime. Per Pt., Disp: , Rfl:  .  promethazine-codeine (PHENERGAN WITH CODEINE) 6.25-10 MG/5ML syrup, Take 5 mLs by mouth every 6 (six) hours as needed for cough., Disp: , Rfl:  .  venlafaxine XR (EFFEXOR-XR) 75 MG 24 hr capsule, Take 225 mg by mouth every morning., Disp: , Rfl:  .  vitamin B-12 (CYANOCOBALAMIN) 500 MCG tablet, Take 500 mcg by mouth 3 (three) times a week., Disp: , Rfl:  .  Wheat Dextrin (BENEFIBER) POWD, Stir 2 teaspoons of Benefiber into 4-8 oz of any non-carbonated beverage or soft food (hot or cold) TID., Disp: 500 g, Rfl: PRN .  morphine (MS CONTIN) 30 MG 12 hr tablet, Take 1 tablet (30 mg total) by mouth every 12 (twelve) hours., Disp: 60 tablet, Rfl: 0 .  morphine (MS CONTIN) 30 MG 12 hr tablet, Take 1 tablet (30 mg total) by mouth every 12 (twelve) hours., Disp: 60 tablet, Rfl: 0  ROS  Constitutional: Denies any fever or chills Gastrointestinal: No reported hemesis, hematochezia, vomiting, or acute GI distress Musculoskeletal: Denies any acute onset joint swelling, redness, loss of ROM, or weakness Neurological: No reported episodes of acute onset apraxia, aphasia, dysarthria, agnosia, amnesia, paralysis, loss of coordination, or loss of consciousness  Allergies  Kari Gibbs is allergic to iohexol; ace inhibitors; losartan potassium; and neosporin [neomycin-bacitracin zn-polymyx].  [place]  Drug: Kari Gibbs  reports that she does not use drugs. Alcohol:  reports that she does not drink alcohol. Tobacco:  reports that she has never smoked. She has never used smokeless tobacco. 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 Family: family history includes Alzheimer's disease in her father; Hypertension in her mother.  Constitutional Exam  General appearance: alert, cooperative, in no distress and mildly obese Vitals:   12/23/17 0907  BP: 108/64  Pulse: 75  Resp: 16  Temp: (!) 97.5 F (36.4 C)  SpO2: 97%  Weight: 207 lb (93.9 kg)  Height: 5' 6" (1.676 m)  Psych/Mental status: Alert, oriented x 3 (person, place, &  time)       Eyes: PERLA Respiratory: No evidence of acute respiratory distress  Cervical Spine Area Exam  Skin & Axial Inspection: No masses, redness, edema, swelling, or associated skin lesions Alignment: Symmetrical Functional ROM: Unrestricted ROM      Stability: No instability detected Muscle Tone/Strength: Functionally intact. No obvious neuro-muscular anomalies detected. Sensory (Neurological): Unimpaired Palpation: No palpable anomalies              Upper Extremity (UE) Exam    Side: Right upper extremity  Side: Left upper extremity  Skin & Extremity Inspection: Skin color, temperature, and hair growth are WNL. No peripheral edema or cyanosis. No masses, redness, swelling, asymmetry, or associated skin lesions. No contractures.  Skin & Extremity Inspection: Skin color, temperature, and hair  growth are WNL. No peripheral edema or cyanosis. No masses, redness, swelling, asymmetry, or associated skin lesions. No contractures.  Functional ROM: Unrestricted ROM          Functional ROM: Unrestricted ROM          Muscle Tone/Strength: Functionally intact. No obvious neuro-muscular anomalies detected.  Muscle Tone/Strength: Functionally intact. No obvious neuro-muscular anomalies detected.  Sensory (Neurological): Unimpaired          Sensory (Neurological): Unimpaired          Palpation: No palpable anomalies              Palpation: No palpable anomalies              Specialized Test(s): Deferred         Specialized Test(s): Deferred          Thoracic Spine Area Exam  Skin & Axial Inspection: No masses, redness, or swelling Alignment: Symmetrical Functional ROM: Unrestricted ROM Stability: No instability detected Muscle Tone/Strength: Functionally intact. No obvious neuro-muscular anomalies detected. Sensory (Neurological): Unimpaired Muscle strength & Tone: No palpable anomalies   Gait & Posture Assessment  Ambulation: Unassisted Gait: Relatively normal for age and body habitus Posture: WNL   Assessment  Primary Diagnosis & Pertinent Problem List: The primary encounter diagnosis was Chronic flank pain (Location of Primary Source of Pain) (Left). Diagnoses of Chronic abdominal pain (Bilateral) (L>R) (possible left-sided thoracic radiculopathy), Spondylosis of cervical region without myelopathy or radiculopathy, Neurogenic pain, Chronic pain syndrome, Long term current use of opiate analgesic, and Opiate use (60 MME/day) were also pertinent to this visit.  Status Diagnosis  Persistent Persistent Controlled 1. Chronic flank pain (Location of Primary Source of Pain) (Left)   2. Chronic abdominal pain (Bilateral) (L>R) (possible left-sided thoracic radiculopathy)   3. Spondylosis of cervical region without myelopathy or radiculopathy   4. Neurogenic pain   5. Chronic pain syndrome    6. Long term current use of opiate analgesic   7. Opiate use (60 MME/day)     Problems updated and reviewed during this visit: No problems updated. Plan of Care  Pharmacotherapy (Medications Ordered): No orders of the defined types were placed in this encounter.  New Prescriptions   No medications on file   Medications administered today: Kari Gibbs had no medications administered during this visit. Lab-work, procedure(s), and/or referral(s): No orders of the defined types were placed in this encounter.  Imaging and/or referral(s): None  Interventional therapies: Planned, scheduled, and/or pending: Not at this time. She can call for nerve block when needed.   Considering: Left greater occipital nerve RFA  Diagnostic left C2 + TON nerve block  Possible left C2 + TON   Palliative  PRN treatment(s): Left greater occipital nerve RFA     ovider-requested follow-up: No follow-ups on file.  Future Appointments  Date Time Provider [place]  12/30/2017  1:30 PM Mal Misty, MD [facility]   Primary Care Physician: Cari Caraway, MD Location: [facility] Out[facility] Management [facility] Note by: Vevelyn Francois NP Date: 12/23/2017; Time: 9:51 AM  Pain Score Disclaimer: We use the NRS-11 scale. This is a self-reported, subjective measurement of pain severity with only modest accuracy. It is used primarily to identify changes within a particular patient. It must be understood that outpatient pain scales are significantly less accurate that those used for research, where they can be applied under ideal controlled circumstances with minimal exposure to variables. In reality, the score is likely to be a combination of pain intensity and pain affect, where pain affect describes the degree of emotional arousal or changes in action readiness caused by the sensory experience of pain. Factors such as social and work situation, setting, emotional state, anxiety  levels, expectation, and prior pain experience may influence pain perception and show large inter-individual differences that may also be affected by time variables.  Patient instructions provided during this appointment: Patient Instructions   ____________________________________________________________________________________________  Medication Rules  Applies to: All patients receiving prescriptions (written or electronic).  Pharmacy of record: Pharmacy where electronic prescriptions will be sent. If written prescriptions are taken to a different pharmacy, please inform the nursing staff. The pharmacy listed in the electronic medical record should be the one where you would like electronic prescriptions to be sent.  Prescription refills: Only during scheduled appointments. Applies to both, written and electronic prescriptions.  NOTE: The following applies primarily to controlled substances (Opioid* Pain Medications).   Patient's responsibilities: 1. Pain Pills: Bring all pain pills to every appointment (except for procedure appointments). 2. Pill Bottles: Bring pills in original pharmacy bottle. Always bring newest bottle. Bring bottle, even if empty. 3. Medication refills: You are responsible for knowing and keeping track of what medications you need refilled. The day before your appointment, write a list of all prescriptions that need to be refilled. Bring that list to your appointment and give it to the admitting nurse. Prescriptions will be written only during appointments. If you forget a medication, it will not be "Called in", "Faxed", or "electronically sent". You will need to get another appointment to get these prescribed. 4. Prescription Accuracy: You are responsible for carefully inspecting your prescriptions before leaving our office. Have the discharge nurse carefully go over each prescription with you, before taking them home. Make sure that your name is accurately spelled, that  your address is correct. Check the name and dose of your medication to make sure it is accurate. Check the number of pills, and the written instructions to make sure they are clear and accurate. Make sure that you are given enough medication to last until your next medication refill appointment. 5. Taking Medication: Take medication as prescribed. Never take more pills than instructed. Never take medication more frequently than prescribed. Taking less pills or less frequently is permitted and encouraged, when it comes to controlled substances (written prescriptions).  6. Inform other Doctors: Always inform, all of your healthcare providers, of all the medications you take. 7. Pain Medication from other Providers: You are not allowed to accept any additional pain medication from any other Doctor or Healthcare provider. There are two exceptions to this rule. (see below) In the event that you require additional pain medication, you are  responsible for notifying us, as stated below. 8. Medication Agreement: You are responsible for carefully reading and following our Medication Agreement. This must be signed before receiving any prescriptions from our practice. Safely store a copy of your signed Agreement. Violations to the Agreement will result in no further prescriptions. (Additional copies of our Medication Agreement are available upon request.) 9. Laws, Rules, & Regulations: All patients are expected to follow all Federal and [employer], [employer], Rules, [employer]. Ignorance of the Laws does not constitute a valid excuse. The use of any illegal substances is prohibited. 10. Adopted CDC guidelines & recommendations: Target dosing levels will be at or below 60 MME/day. Use of benzodiazepines** is not recommended.  Exceptions: There are only two exceptions to the rule of not receiving pain medications from other Healthcare Providers. 1. Exception #1 (Emergencies): In the event of an emergency (i.e.:  accident requiring emergency care), you are allowed to receive additional pain medication. However, you are responsible for: As soon as you are able, call our office [phone], at any time of the day or night, and leave a message stating your name, the date and nature of the emergency, and the name and dose of the medication prescribed. In the event that your call is answered by a member of our staff, make sure to document and save the date, time, and the name of the person that took your information.  2. Exception #2 (Planned Surgery): In the event that you are scheduled by another doctor or dentist to have any type of surgery or procedure, you are allowed (for a period no longer than 30 days), to receive additional pain medication, for the acute post-op pain. However, in this case, you are responsible for picking up a copy of our "Post-op Pain Management for Surgeons" handout, and giving it to your surgeon or dentist. This document is available at our office, and does not require an appointment to obtain it. Simply go to our office during business hours (Monday-Thursday from 8:00 AM to 4:00 PM) (Friday 8:00 AM to 12:00 Noon) or if you have a scheduled appointment with [place], prior to your surgery, and ask for it by name. In addition, you will need to provide [place] with your name, name of your surgeon, type of surgery, and date of procedure or surgery.  *Opioid medications include: morphine, codeine, oxycodone, oxymorphone, hydrocodone, hydromorphone, meperidine, tramadol, tapentadol, buprenorphine, fentanyl, methadone. **Benzodiazepine medications include: diazepam (Valium), alprazolam (Xanax), clonazepam (Klonopine), lorazepam (Ativan), clorazepate (Tranxene), chlordiazepoxide (Librium), estazolam (Prosom), oxazepam (Serax), temazepam (Restoril), triazolam (Halcion) (Last updated: 10/09/2017) ____________________________________________________________________________________________    BMI Assessment:  Estimated body mass index is 33.41 kg/m as calculated from the following:   Height as of this encounter: 5' 6" (1.676 m).   Weight as of this encounter: 207 lb (93.9 kg).  BMI interpretation table: BMI level Category Range association with higher incidence of chronic pain  <18 kg/m2 Underweight   18.5-24.9 kg/m2 Ideal body weight   25-29.9 kg/m2 Overweight Increased incidence by 20%  30-34.9 kg/m2 Obese (Class I) Increased incidence by 68%  35-39.9 kg/m2 Severe obesity (Class II) Increased incidence by 136%  >40 kg/m2 Extreme obesity (Class III) Increased incidence by 254%   Patient's current BMI Ideal Body weight  Body mass index is 33.41 kg/m. Ideal body weight: 59.3 kg (130 lb 11.7 oz) Adjusted ideal body weight: 73.1 kg (161 lb 3.8 oz)   BMI Readings from Last 4 Encounters:  12/23/17 33.41 kg/m  09/25/17 33.09 kg/m  09/02/17  34.16 kg/m  07/01/17 33.09 kg/m   Wt Readings from Last 4 Encounters:  12/23/17 207 lb (93.9 kg)  09/25/17 205 lb (93 kg)  09/02/17 205 lb 4.8 oz (93.1 kg)  07/01/17 205 lb (93 kg)

## 2017-12-23 NOTE — Patient Instructions (Signed)
____________________________________________________________________________________________  Medication Rules  Applies to: All patients receiving prescriptions (written or electronic).  Pharmacy of record: Pharmacy where electronic prescriptions will be sent. If written prescriptions are taken to a different pharmacy, please inform the nursing staff. The pharmacy listed in the electronic medical record should be the one where you would like electronic prescriptions to be sent.  Prescription refills: Only during scheduled appointments. Applies to both, written and electronic prescriptions.  NOTE: The following applies primarily to controlled substances (Opioid* Pain Medications).   Patient's responsibilities: 1. Pain Pills: Bring all pain pills to every appointment (except for procedure appointments). 2. Pill Bottles: Bring pills in original pharmacy bottle. Always bring newest bottle. Bring bottle, even if empty. 3. Medication refills: You are responsible for knowing and keeping track of what medications you need refilled. The day before your appointment, write a list of all prescriptions that need to be refilled. Bring that list to your appointment and give it to the admitting nurse. Prescriptions will be written only during appointments. If you forget a medication, it will not be "Called in", "Faxed", or "electronically sent". You will need to get another appointment to get these prescribed. 4. Prescription Accuracy: You are responsible for carefully inspecting your prescriptions before leaving our office. Have the discharge nurse carefully go over each prescription with you, before taking them home. Make sure that your name is accurately spelled, that your address is correct. Check the name and dose of your medication to make sure it is accurate. Check the number of pills, and the written instructions to make sure they are clear and accurate. Make sure that you are given enough medication to last  until your next medication refill appointment. 5. Taking Medication: Take medication as prescribed. Never take more pills than instructed. Never take medication more frequently than prescribed. Taking less pills or less frequently is permitted and encouraged, when it comes to controlled substances (written prescriptions).  6. Inform other Doctors: Always inform, all of your healthcare providers, of all the medications you take. 7. Pain Medication from other Providers: You are not allowed to accept any additional pain medication from any other Doctor or Healthcare provider. There are two exceptions to this rule. (see below) In the event that you require additional pain medication, you are responsible for notifying us, as stated below. 8. Medication Agreement: You are responsible for carefully reading and following our Medication Agreement. This must be signed before receiving any prescriptions from our practice. Safely store a copy of your signed Agreement. Violations to the Agreement will result in no further prescriptions. (Additional copies of our Medication Agreement are available upon request.) 9. Laws, Rules, & Regulations: All patients are expected to follow all Federal and State Laws, Statutes, Rules, & Regulations. Ignorance of the Laws does not constitute a valid excuse. The use of any illegal substances is prohibited. 10. Adopted CDC guidelines & recommendations: Target dosing levels will be at or below 60 MME/day. Use of benzodiazepines** is not recommended.  Exceptions: There are only two exceptions to the rule of not receiving pain medications from other Healthcare Providers. 1. Exception #1 (Emergencies): In the event of an emergency (i.e.: accident requiring emergency care), you are allowed to receive additional pain medication. However, you are responsible for: As soon as you are able, call our office [phone], at any time of the day or night, and leave a message stating your name, the  date and nature of the emergency, and the name and dose of the medication   prescribed. In the event that your call is answered by a member of our staff, make sure to document and save the date, time, and the name of the person that took your information.  2. Exception #2 (Planned Surgery): In the event that you are scheduled by another doctor or dentist to have any type of surgery or procedure, you are allowed (for a period no longer than 30 days), to receive additional pain medication, for the acute post-op pain. However, in this case, you are responsible for picking up a copy of our "Post-op Pain Management for Surgeons" handout, and giving it to your surgeon or dentist. This document is available at our office, and does not require an appointment to obtain it. Simply go to our office during business hours (Monday-Thursday from 8:00 AM to 4:00 PM) (Friday 8:00 AM to 12:00 Noon) or if you have a scheduled appointment with [place], prior to your surgery, and ask for it by name. In addition, you will need to provide [place] with your name, name of your surgeon, type of surgery, and date of procedure or surgery.  *Opioid medications include: morphine, codeine, oxycodone, oxymorphone, hydrocodone, hydromorphone, meperidine, tramadol, tapentadol, buprenorphine, fentanyl, methadone. **Benzodiazepine medications include: diazepam (Valium), alprazolam (Xanax), clonazepam (Klonopine), lorazepam (Ativan), clorazepate (Tranxene), chlordiazepoxide (Librium), estazolam (Prosom), oxazepam (Serax), temazepam (Restoril), triazolam (Halcion) (Last updated: 10/09/2017) ____________________________________________________________________________________________    BMI Assessment: Estimated body mass index is 33.41 kg/m as calculated from the following:   Height as of this encounter: 5\' 6"  (1.676 m).   Weight as of this encounter: 207 lb (93.9 kg).  BMI interpretation table: BMI level Category Range association with higher  incidence of chronic pain  <18 kg/m2 Underweight   18.5-24.9 kg/m2 Ideal body weight   25-29.9 kg/m2 Overweight Increased incidence by 20%  30-34.9 kg/m2 Obese (Class I) Increased incidence by 68%  35-39.9 kg/m2 Severe obesity (Class II) Increased incidence by 136%  >40 kg/m2 Extreme obesity (Class III) Increased incidence by 254%   Patient's current BMI Ideal Body weight  Body mass index is 33.41 kg/m. Ideal body weight: 59.3 kg (130 lb 11.7 oz) Adjusted ideal body weight: 73.1 kg (161 lb 3.8 oz)   BMI Readings from Last 4 Encounters:  12/23/17 33.41 kg/m  09/25/17 33.09 kg/m  09/02/17 34.16 kg/m  07/01/17 33.09 kg/m   Wt Readings from Last 4 Encounters:  12/23/17 207 lb (93.9 kg)  09/25/17 205 lb (93 kg)  09/02/17 205 lb 4.8 oz (93.1 kg)  07/01/17 205 lb (93 kg)

## 2017-12-23 NOTE — Progress Notes (Signed)
Nursing Pain Medication Assessment:  Safety precautions to be maintained throughout the outpatient stay will include: orient to surroundings, keep bed in low position, maintain call bell within reach at all times, provide assistance with transfer out of bed and ambulation.  Medication Inspection Compliance: Pill count conducted under aseptic conditions, in front of the patient. Neither the pills nor the bottle was removed from the patient's sight at any time. Once count was completed pills were immediately returned to the patient in their original bottle.  Medication: See above Pill/Patch Count: 5 of 60 pills remain Pill/Patch Appearance: Markings consistent with prescribed medication Bottle Appearance: Standard pharmacy container. Clearly labeled. Filled Date: 4 / 17 / 2019 Last Medication intake:  Today

## 2017-12-25 ENCOUNTER — Other Ambulatory Visit: Payer: Self-pay | Admitting: Nurse Practitioner

## 2017-12-25 MED ORDER — NALOXONE HCL 4 MG/0.1ML NA LIQD: NASAL | 0 refills | Status: DC

## 2017-12-30 ENCOUNTER — Ambulatory Visit: Payer: Medicare Other | Admitting: Vascular Surgery

## 2018-01-12 ENCOUNTER — Encounter: Payer: Self-pay | Admitting: Vascular Surgery

## 2018-01-12 ENCOUNTER — Other Ambulatory Visit: Payer: Self-pay

## 2018-01-12 ENCOUNTER — Ambulatory Visit: Payer: Medicare Other | Admitting: Vascular Surgery

## 2018-01-12 VITALS — BP 129/78 | HR 80 | Resp 18 | Ht 66.0 in | Wt 218.0 lb

## 2018-01-12 DIAGNOSIS — I83892 Varicose veins of left lower extremities with other complications: Secondary | ICD-10-CM

## 2018-01-12 NOTE — Progress Notes (Signed)
Subjective:     Patient ID: Kari Gibbs, female   DOB: 12/17/1950, 67 y.o.   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 She has pain and swelling which has not improved with long leg elastic compression stockings 20-30 millimeter gradient, elevation, and ibuprofen. She describes this as an aching throbbing discomfort which worsens as the day progresses. She would like treatment because it is affecting her daily living. She had a skin rash on her arms and legs and was seen by dermatologist 3 months ago and this has improved. She has no history of DVT thrombophlebitis stasis ulcers or bleeding.  Past Medical History:  Diagnosis Date  . Abdominal pain   . Anxiety   . Arthritis    OA LOWER SPINE  . Depression    MOSTLY IN THE FALL OF THE YEAR  . 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 Fatigue   . Gastritis    Distal gastritis  . GERD (gastroesophageal reflux disease)    "told that she does"  . Headache(784.0)    HX OF MIGRAINES  . Hiatal hernia 09/2009  . History of kidney stones   . History of rib fracture (Left 4th and 8th rib) 06/28/2015  . Hypertension   . IBS (irritable bowel syndrome)   . Nausea & vomiting   . OSTEOARTHRITIS 05/01/2010   Qualifier: Diagnosis of  By: Campbell MD, John    . Pneumonia    WHEN PT IN 6 TH GRADE  . SBO (small bowel obstruction) (HCC) 2013  . Varicose veins     Social History   Tobacco Use  . Smoking status: Never Smoker  . Smokeless tobacco: Never Used  Substance Use Topics  . Alcohol use: No    Alcohol/week: 0.0 oz    Family History  Problem Relation Age of Onset  . Hypertension Mother   . Alzheimer's disease Father     Allergies  Allergen Reactions  . Iohexol      Code: HIVES, Desc: Pt has had a prior IVP dye reaction,(no scans in health system).Symptoms were hives,and airway obstruction!, Onset Date: 02172001    . Ace Inhibitors     REACTION: cyclic cough  . Losartan Potassium Other (See Comments)    Raises blood pressure; fatigue  . Neosporin [Neomycin-Bacitracin Zn-Polymyx] Dermatitis     Current Outpatient Medications:  .  busPIRone (BUSPAR) 15 MG tablet, TAKE 1/3 TO 1/2 TAB AS NEEDED FOR ANXIETY UP TO TWICE A DAY, Disp: , Rfl: 3 .  Cholecalciferol (VITAMIN D3) LIQD, Take 3 drops daily by mouth., Disp: , Rfl:  .  conjugated estrogens (PREMARIN) vaginal cream, Premarin 0.625 mg/gram vaginal cream  INSERT 1GRAM VAGINALLY 3 TIMES WEEKLY AT BEDTIME, Disp: , Rfl:  .  irbesartan (AVAPRO) 75 MG tablet, Take 75 mg by mouth daily., Disp: , Rfl:  .  Magnesium 500 MG TABS, Take 1 tablet by mouth 2 (two) times daily., Disp: , Rfl:  .  [START ON 02/24/2018] morphine (MS CONTIN) 30 MG 12 hr tablet, Take 1 tablet (30 mg total) by mouth every 12 (twelve) hours., Disp: 60 tablet, Rfl: 0 .  naloxone (NARCAN) nasal spray 4 mg/0.1 mL, Spray into one nostril. Repeat with second device into other nostril after 2-3 minutes if no or minimal response. Use in case of opioid overdose., Disp: 1 kit, Rfl: 0 .  omeprazole (PRILOSEC) 20 MG capsule, Take 20   mg by mouth 2 (two) times daily before a meal., Disp: , Rfl:  .  PRENATAL VIT-FE FUM-FA-OMEGA PO, Take 1 tablet by mouth daily., Disp: , Rfl:  .  Probiotic Product (ALIGN) 4 MG CAPS, Take 1 capsule by mouth at bedtime. Per Pt., Disp: , Rfl:  .  venlafaxine XR (EFFEXOR-XR) 75 MG 24 hr capsule, Take 225 mg by mouth every morning., Disp: , Rfl:  .  vitamin B-12 (CYANOCOBALAMIN) 500 MCG tablet, Take 500 mcg by mouth 3 (three) times a week., Disp: , Rfl:  .  Wheat Dextrin (BENEFIBER) POWD, Stir 2 teaspoons of Benefiber into 4-8 oz of any non-carbonated beverage or soft food (hot or cold) TID., Disp: 500 g, Rfl: PRN .  diclofenac (FLECTOR) 1.3 % PTCH, Place 1 patch onto the skin 2 (two) times daily., Disp: , Rfl:  .  gabapentin (NEURONTIN) 300 MG capsule, Take 3 capsules (900 mg  total) by mouth 2 (two) times daily. May go up to 3 capsules 4 times a day. Titrate up slowly., Disp: 180 capsule, Rfl: 2 .  montelukast (SINGULAIR) 10 MG tablet, Take 10 mg by mouth at bedtime., Disp: , Rfl:  .  [START ON 01/25/2018] morphine (MS CONTIN) 30 MG 12 hr tablet, Take 1 tablet (30 mg total) by mouth every 12 (twelve) hours. (Patient not taking: Reported on 01/12/2018), Disp: 60 tablet, Rfl: 0 .  morphine (MS CONTIN) 30 MG 12 hr tablet, Take 1 tablet (30 mg total) by mouth every 12 (twelve) hours. (Patient not taking: Reported on 01/12/2018), Disp: 60 tablet, Rfl: 0 .  promethazine-codeine (PHENERGAN WITH CODEINE) 6.25-10 MG/5ML syrup, Take 5 mLs by mouth every 6 (six) hours as needed for cough., Disp: , Rfl:   Vitals:   01/12/18 1342  BP: 129/78  Pulse: 80  Resp: 18  SpO2: 96%  Weight: 218 lb (98.9 kg)  Height: 5' 6" (1.676 m)    Body mass index is 35.19 kg/m.         Review of Systems Denies chest pain, dyspnea on exertion, PND, orthopnea, hemoptysis    Objective:   Physical Exam BP 129/78 (BP Location: Right Arm, Patient Position: Sitting, Cuff Size: Large)   Pulse 80   Resp 18   Ht 5' 6" (1.676 m)   Wt 218 lb (98.9 kg)   SpO2 96%   BMI 35.19 kg/m   Gen. well-developed well-nourished female no apparent distress alert and oriented 3 Lungs no rhonchi or wheezing Left leg with 1+ chronic edema with bulging varicosities in the medial calf. 3 posterior cells pedis pulse palpable. No ulceration or hyperpigmentation noted.  Today I reviewed the venous reflux exam performed 09/02/2017 also performed a bedside SonoSite ultrasound exam. Patient does have a grossly enlarged great saphenous vein from saphenofemoral junction to the knee with gross reflux throughout and no DVT     Assessment:     Pain and swelling left leg aggravated by gross reflux throughout left great saphenous vein causing symptoms which are affecting patient's daily living and resistant to  conservative measures including long-leg elastic compression stockings,-20-30 millimeter gradient, elevation, and ibuprofen.CEAP2    Plan:     Patient needs laser ablation left great saphenous vein to help relieve her symptoms We'll proceed with precertification to perform this in the near future

## 2018-01-13 ENCOUNTER — Other Ambulatory Visit: Payer: Self-pay | Admitting: *Deleted

## 2018-01-13 DIAGNOSIS — I83892 Varicose veins of left lower extremities with other complications: Secondary | ICD-10-CM

## 2018-01-13 NOTE — Telephone Encounter (Signed)
This telephone encounter did not occur.  This encounter was opened in error.

## 2018-01-20 ENCOUNTER — Other Ambulatory Visit: Payer: Medicare Other | Admitting: Vascular Surgery

## 2018-01-27 ENCOUNTER — Encounter (HOSPITAL_COMMUNITY): Payer: Self-pay

## 2018-01-27 ENCOUNTER — Ambulatory Visit: Payer: Self-pay | Admitting: Vascular Surgery

## 2018-02-04 ENCOUNTER — Ambulatory Visit: Payer: Medicare Other | Admitting: Vascular Surgery

## 2018-02-04 ENCOUNTER — Other Ambulatory Visit: Payer: Self-pay

## 2018-02-04 ENCOUNTER — Encounter: Payer: Self-pay | Admitting: Vascular Surgery

## 2018-02-04 VITALS — BP 115/73 | HR 75 | Temp 97.8°F | Resp 18 | Ht 66.0 in | Wt 220.0 lb

## 2018-02-04 DIAGNOSIS — I83892 Varicose veins of left lower extremities with other complications: Secondary | ICD-10-CM | POA: Diagnosis not present

## 2018-02-04 HISTORY — PX: ENDOVENOUS ABLATION SAPHENOUS VEIN W/ LASER: SUR449

## 2018-02-04 NOTE — Progress Notes (Signed)
  Subjective:     Patient ID: Kari Gibbs, female   DOB: 08-07-1951, 67 y.o.   MRN: [id number]  HPI This 67 year old female had laser ablation of the left great saphenous vein performed under local tumescent anesthesia. A total of 2338 J of energy was utilized. She tolerated the procedure well.  Review of Systems     Objective:   Physical Exam BP 115/73 (BP Location: Left Arm, Patient Position: Sitting, Cuff Size: Large)   Pulse 75   Temp 97.8 F (36.6 C) (Oral)   Resp 18   Ht 5\' 6"  (1.676 m)   Wt 220 lb (99.8 kg)   SpO2 99%   BMI 35.51 kg/m        Assessment:     Well-tolerated laser ablation left great saphenous vein performed under local tumescent anesthesia    Plan:     Return in 1 week to be followed by Dr. Scot Dock with follow-up ultrasound to confirm vein closure.

## 2018-02-04 NOTE — Progress Notes (Signed)
Laser Ablation Procedure    Date: 02/04/2018   Modest Draeger DOB:Apr 05, 1951  Consent signed: Yes    Surgeon:  Dr. Nelda Severe. Kellie Simmering  Procedure: Laser Ablation: left Greater Saphenous Vein  BP 115/73 (BP Location: Left Arm, Patient Position: Sitting, Cuff Size: Large)   Pulse 75   Temp 97.8 F (36.6 C) (Oral)   Resp 18   Ht 5\' 6"  (1.676 m)   Wt 220 lb (99.8 kg)   SpO2 99%   BMI 35.51 kg/m   Tumescent Anesthesia: 425 cc 0.9% NaCl with 50 cc Lidocaine HCL 1% and 15 cc 8.4% NaHCO3  Local Anesthesia: 6 cc Lidocaine HCL and NaHCO3 (ratio 2:1)  Pulsed Mode: 15 watts, 554ms delay, 1.0 duration  Total Energy:  2338 Joules            Total Pulses: 157               Total Time:  2:36    Patient tolerated procedure well  Notes: Per patient request, applied Betamethasone  topical ointment to inflammed skin eruptions on left calf and covered with ABD pads prior to putting on thigh high compression hose.    Description of Procedure:  After marking the course of the secondary varicosities, the patient was placed on the operating table in the supine position, and the left leg was prepped and draped in sterile fashion.   Local anesthetic was administered and under ultrasound guidance the saphenous vein was accessed with a micro needle and guide wire; then the mirco puncture sheath was placed.  A guide wire was inserted saphenofemoral junction , followed by a 5 french sheath.  The position of the sheath and then the laser fiber below the junction was confirmed using the ultrasound.  Tumescent anesthesia was administered along the course of the saphenous vein using ultrasound guidance. The patient was placed in Trendelenburg position and protective laser glasses were placed on patient and staff, and the laser was fired at 15 watts continuous mode advancing 1-1mm/second for a total of 2338 joules.     Steri strip was applied to the IV insertion site and ABD pads and thigh high compression stockings  were applied.  Ace wrap bandages were applied over the left calf and thigh and at the top of the saphenofemoral junction. Blood loss was less than 15 cc.  The patient ambulated out of the operating room having tolerated the procedure well.

## 2018-02-05 ENCOUNTER — Encounter: Payer: Self-pay | Admitting: Vascular Surgery

## 2018-02-16 ENCOUNTER — Encounter: Payer: Self-pay | Admitting: Vascular Surgery

## 2018-02-16 ENCOUNTER — Ambulatory Visit (INDEPENDENT_AMBULATORY_CARE_PROVIDER_SITE_OTHER): Payer: Medicare Other | Admitting: Vascular Surgery

## 2018-02-16 ENCOUNTER — Ambulatory Visit (HOSPITAL_COMMUNITY)
Admission: RE | Admit: 2018-02-16 | Discharge: 2018-02-16 | Disposition: A | Discharge status: Home / Self Care | Payer: Medicare Other | Source: Ambulatory Visit | Attending: Vascular Surgery | Admitting: Vascular Surgery

## 2018-02-16 VITALS — BP 150/82 | HR 73 | Resp 18 | Ht 66.0 in | Wt 221.0 lb

## 2018-02-16 DIAGNOSIS — Z9889 Other specified postprocedural states: Secondary | ICD-10-CM | POA: Insufficient documentation

## 2018-02-16 DIAGNOSIS — I83892 Varicose veins of left lower extremities with other complications: Secondary | ICD-10-CM | POA: Diagnosis not present

## 2018-02-16 DIAGNOSIS — Z48812 Encounter for surgical aftercare following surgery on the circulatory system: Secondary | ICD-10-CM

## 2018-02-16 NOTE — Progress Notes (Signed)
Patient name: Kari Gibbs MRN: [id number] DOB: Dec 07, 1950 Sex: female  REASON FOR VISIT:   Follow-up after endovenous laser ablation of the left great saphenous vein  HPI:   Kari Gibbs is a pleasant 67 y.o. female who underwent endovenous laser ablation of the left great saphenous vein by Dr. Kellie Simmering on 02/04/2018.  She had previously undergone endovenous laser ablation of the right great saphenous vein.  She has done well since the procedure.  She did have a couple of days where she had some pain in the mid medial thigh likely related to some phlebitis.  She treated this with warm compresses and elevation and her symptoms resolved.  She had mild leg swelling.  She is being followed by the dermatologist for some lesions on both legs.  Current Outpatient Medications  Medication Sig Dispense Refill  . busPIRone (BUSPAR) 15 MG tablet TAKE 1/3 TO 1/2 TAB AS NEEDED FOR ANXIETY UP TO TWICE A DAY  3  . Cholecalciferol (VITAMIN D3) LIQD Take 3 drops daily by mouth.    . conjugated estrogens (PREMARIN) vaginal cream Premarin 0.625 mg/gram vaginal cream  INSERT 1GRAM VAGINALLY 3 TIMES WEEKLY AT BEDTIME    . diclofenac (FLECTOR) 1.3 % PTCH Place 1 patch onto the skin 2 (two) times daily.    Marland Kitchen gabapentin (NEURONTIN) 300 MG capsule Take 3 capsules (900 mg total) by mouth 2 (two) times daily. May go up to 3 capsules 4 times a day. Titrate up slowly. 180 capsule 2  . irbesartan (AVAPRO) 75 MG tablet Take 75 mg by mouth daily.    . Magnesium 500 MG TABS Take 1 tablet by mouth 2 (two) times daily.    . montelukast (SINGULAIR) 10 MG tablet Take 10 mg by mouth at bedtime.    Derrill Memo ON 02/24/2018] morphine (MS CONTIN) 30 MG 12 hr tablet Take 1 tablet (30 mg total) by mouth every 12 (twelve) hours. 60 tablet 0  . morphine (MS CONTIN) 30 MG 12 hr tablet Take 1 tablet (30 mg total) by mouth every 12 (twelve) hours. (Patient not taking: Reported on 01/12/2018) 60 tablet 0  . morphine (MS CONTIN) 30 MG 12 hr  tablet Take 1 tablet (30 mg total) by mouth every 12 (twelve) hours. (Patient not taking: Reported on 01/12/2018) 60 tablet 0  . naloxone (NARCAN) nasal spray 4 mg/0.1 mL Spray into one nostril. Repeat with second device into other nostril after 2-3 minutes if no or minimal response. Use in case of opioid overdose. 1 kit 0  . omeprazole (PRILOSEC) 20 MG capsule Take 20 mg by mouth 2 (two) times daily before a meal.    . PRENATAL VIT-FE FUM-FA-OMEGA PO Take 1 tablet by mouth daily.    . Probiotic Product (ALIGN) 4 MG CAPS Take 1 capsule by mouth at bedtime. Per Pt.    . promethazine-codeine (PHENERGAN WITH CODEINE) 6.25-10 MG/5ML syrup Take 5 mLs by mouth every 6 (six) hours as needed for cough.    . venlafaxine XR (EFFEXOR-XR) 75 MG 24 hr capsule Take 225 mg by mouth every morning.    . vitamin B-12 (CYANOCOBALAMIN) 500 MCG tablet Take 500 mcg by mouth 3 (three) times a week.    . Wheat Dextrin (BENEFIBER) POWD Stir 2 teaspoons of Benefiber into 4-8 oz of any non-carbonated beverage or soft food (hot or cold) TID. 500 g PRN   No current facility-administered medications for this visit.     REVIEW OF SYSTEMS:  _0  denotes positive finding, _1   denotes negative finding Cardiac  Comments:  Chest pain or chest pressure:    Shortness of breath upon exertion:    Short of breath when lying flat:    Irregular heart rhythm:    Constitutional    Fever or chills:     PHYSICAL EXAM:   Vitals:   02/16/18 1002 02/16/18 1003  BP: (!) 154/84 (!) 150/82  Pulse: 73   Resp: 18   SpO2: 98%   Weight: 221 lb (100.2 kg)   Height: _0  (1.676 m)     GENERAL: The patient is a well-nourished female, in no acute distress. The vital signs are documented above. CARDIOVASCULAR: There is a regular rate and rhythm. PULMONARY: There is good air exchange bilaterally without wheezing or rales. She has no significant swelling in either leg.  She has no evidence of active phlebitis.  She has minimal  bruising.  DATA:   VENOUS DUPLEX: I have independently interpreted her venous duplex scan today.  This shows successful closure of her left great saphenous vein to within 2.25 cm in the saphenofemoral junction.  There is no evidence of DVT.  MEDICAL ISSUES:   STATUS POST ENDOVENOUS LASER ABLATION OF THE LEFT GREAT SAPHENOUS VEIN: The patient is done well status post endovenous laser ablation of the left great saphenous vein.  She is had a successful procedure with no complications.  There is no evidence of DVT.  We have discussed some simple lifestyle changes to help prevent future venous problems.  We will see her back as needed.  Deitra Mayo [facility] [phone]

## 2018-03-25 ENCOUNTER — Other Ambulatory Visit: Payer: Self-pay

## 2018-03-25 ENCOUNTER — Ambulatory Visit: Payer: Medicare Other | Attending: Nurse Practitioner | Admitting: Nurse Practitioner

## 2018-03-25 ENCOUNTER — Encounter: Payer: Self-pay | Admitting: Nurse Practitioner

## 2018-03-25 VITALS — BP 147/75 | HR 76 | Temp 97.6°F | Resp 16 | Ht 66.0 in | Wt 220.0 lb

## 2018-03-25 DIAGNOSIS — Z79891 Long term (current) use of opiate analgesic: Secondary | ICD-10-CM | POA: Insufficient documentation

## 2018-03-25 DIAGNOSIS — R1012 Left upper quadrant pain: Secondary | ICD-10-CM

## 2018-03-25 DIAGNOSIS — I10 Essential (primary) hypertension: Secondary | ICD-10-CM | POA: Diagnosis not present

## 2018-03-25 DIAGNOSIS — M5414 Radiculopathy, thoracic region: Secondary | ICD-10-CM | POA: Diagnosis not present

## 2018-03-25 DIAGNOSIS — M533 Sacrococcygeal disorders, not elsewhere classified: Secondary | ICD-10-CM | POA: Diagnosis not present

## 2018-03-25 DIAGNOSIS — G894 Chronic pain syndrome: Secondary | ICD-10-CM | POA: Diagnosis not present

## 2018-03-25 DIAGNOSIS — K219 Gastro-esophageal reflux disease without esophagitis: Secondary | ICD-10-CM | POA: Insufficient documentation

## 2018-03-25 DIAGNOSIS — M5481 Occipital neuralgia: Secondary | ICD-10-CM | POA: Diagnosis not present

## 2018-03-25 DIAGNOSIS — M47812 Spondylosis without myelopathy or radiculopathy, cervical region: Secondary | ICD-10-CM | POA: Diagnosis not present

## 2018-03-25 DIAGNOSIS — M5441 Lumbago with sciatica, right side: Secondary | ICD-10-CM

## 2018-03-25 DIAGNOSIS — G8929 Other chronic pain: Secondary | ICD-10-CM

## 2018-03-25 DIAGNOSIS — M7741 Metatarsalgia, right foot: Secondary | ICD-10-CM | POA: Diagnosis not present

## 2018-03-25 DIAGNOSIS — M5442 Lumbago with sciatica, left side: Secondary | ICD-10-CM | POA: Insufficient documentation

## 2018-03-25 DIAGNOSIS — T402X5A Adverse effect of other opioids, initial encounter: Secondary | ICD-10-CM

## 2018-03-25 DIAGNOSIS — K5903 Drug induced constipation: Secondary | ICD-10-CM | POA: Diagnosis not present

## 2018-03-25 DIAGNOSIS — J309 Allergic rhinitis, unspecified: Secondary | ICD-10-CM | POA: Insufficient documentation

## 2018-03-25 DIAGNOSIS — M542 Cervicalgia: Secondary | ICD-10-CM | POA: Insufficient documentation

## 2018-03-25 DIAGNOSIS — R1011 Right upper quadrant pain: Secondary | ICD-10-CM

## 2018-03-25 DIAGNOSIS — R109 Unspecified abdominal pain: Secondary | ICD-10-CM | POA: Insufficient documentation

## 2018-03-25 DIAGNOSIS — M792 Neuralgia and neuritis, unspecified: Secondary | ICD-10-CM

## 2018-03-25 MED ORDER — BENEFIBER PO POWD: ORAL | 99 refills | Status: DC

## 2018-03-25 MED ORDER — MORPHINE SULFATE ER 30 MG PO TBCR: 30.0000 mg | EXTENDED_RELEASE_TABLET | Freq: Two times a day (BID) | ORAL | 0 refills | Status: DC

## 2018-03-25 MED ORDER — GABAPENTIN 300 MG PO CAPS: 900.0000 mg | ORAL_CAPSULE | Freq: Two times a day (BID) | ORAL | 0 refills | Status: DC

## 2018-03-25 MED ORDER — PREGABALIN 25 MG PO CAPS: 25.0000 mg | ORAL_CAPSULE | Freq: Three times a day (TID) | ORAL | 0 refills | Status: DC

## 2018-03-25 NOTE — Patient Instructions (Addendum)
Benefiber and gabapentin have. been escribed to your pharmacy.  You have been given 3 Rx for morphine (MS contin) and Rx for Lyrica.  GENERAL RISKS AND COMPLICATIONS  What are the risk, side effects and possible complications? Generally speaking, most procedures are safe.  However, with any procedure there are risks, side effects, and the possibility of complications.  The risks and complications are dependent upon the sites that are lesioned, or the type of nerve block to be performed.  The closer the procedure is to the spine, the more serious the risks are.  Great care is taken when placing the radio frequency needles, block needles or lesioning probes, but sometimes complications can occur. 1. Infection: Any time there is an injection through the skin, there is a risk of infection.  This is why sterile conditions are used for these blocks.  There are four possible types of infection. 1. Localized skin infection. 2. Central Nervous System Infection-This can be in the form of Meningitis, which can be deadly. 3. Epidural Infections-This can be in the form of an epidural abscess, which can cause pressure inside of the spine, causing compression of the spinal cord with subsequent paralysis. This would require an emergency surgery to decompress, and there are no guarantees that the patient would recover from the paralysis. 4. Discitis-This is an infection of the intervertebral discs.  It occurs in about 1% of discography procedures.  It is difficult to treat and it may lead to surgery.        2. Pain: the needles have to go through skin and soft tissues, will cause soreness.       3. Damage to internal structures:  The nerves to be lesioned may be near blood vessels or    other nerves which can be potentially damaged.       4. Bleeding: Bleeding is more common if the patient is taking blood thinners such as  aspirin, Coumadin, Ticiid, Plavix, etc., or if he/she have some genetic predisposition  such  as hemophilia. Bleeding into the spinal canal can cause compression of the spinal  cord with subsequent paralysis.  This would require an emergency surgery to  decompress and there are no guarantees that the patient would recover from the  paralysis.       5. Pneumothorax:  Puncturing of a lung is a possibility, every time a needle is introduced in  the area of the chest or upper back.  Pneumothorax refers to free air around the  collapsed lung(s), inside of the thoracic cavity (chest cavity).  Another two possible  complications related to a similar event would include: Hemothorax and Chylothorax.   These are variations of the Pneumothorax, where instead of air around the collapsed  lung(s), you may have blood or chyle, respectively.       6. Spinal headaches: They may occur with any procedures in the area of the spine.       7. Persistent CSF (Cerebro-Spinal Fluid) leakage: This is a rare problem, but may occur  with prolonged intrathecal or epidural catheters either due to the formation of a fistulous  track or a dural tear.       8. Nerve damage: By working so close to the spinal cord, there is always a possibility of  nerve damage, which could be as serious as a permanent spinal cord injury with  paralysis.       9. Death:  Although rare, severe deadly allergic reactions known as "Anaphylactic  reaction"  can occur to any of the medications used.      10. Worsening of the symptoms:  We can always make thing worse.  What are the chances of something like this happening? Chances of any of this occuring are extremely low.  By statistics, you have more of a chance of getting killed in a motor vehicle accident: while driving to the hospital than any of the above occurring .  Nevertheless, you should be aware that they are possibilities.  In general, it is similar to taking a shower.  Everybody knows that you can slip, hit your head and get killed.  Does that mean that you should not shower again?   Nevertheless always keep in mind that statistics do not mean anything if you happen to be on the wrong side of them.  Even if a procedure has a 1 (one) in a 1,000,000 (million) chance of going wrong, it you happen to be that one..Also, keep in mind that by statistics, you have more of a chance of having something go wrong when taking medications.  Who should not have this procedure? If you are on a blood thinning medication (e.g. Coumadin, Plavix, see list of "Blood Thinners"), or if you have an active infection going on, you should not have the procedure.  If you are taking any blood thinners, please inform your physician.  How should I prepare for this procedure?  Do not eat or drink anything at least six hours prior to the procedure.  Bring a driver with you .  It cannot be a taxi.  Come accompanied by an adult that can drive you back, and that is strong enough to help you if your legs get weak or numb from the local anesthetic.  Take all of your medicines the morning of the procedure with just enough water to swallow them.  If you have diabetes, make sure that you are scheduled to have your procedure done first thing in the morning, whenever possible.  If you have diabetes, take only half of your insulin dose and notify our nurse that you have done so as soon as you arrive at the clinic.  If you are diabetic, but only take blood sugar pills (oral hypoglycemic), then do not take them on the morning of your procedure.  You may take them after you have had the procedure.  Do not take aspirin or any aspirin-containing medications, at least eleven (11) days prior to the procedure.  They may prolong bleeding.  Wear loose fitting clothing that may be easy to take off and that you would not mind if it got stained with Betadine or blood.  Do not wear any jewelry or perfume  Remove any nail coloring.  It will interfere with some of our monitoring equipment.  NOTE: Remember that this is not  meant to be interpreted as a complete list of all possible complications.  Unforeseen problems may occur.  BLOOD THINNERS The following drugs contain aspirin or other products, which can cause increased bleeding during surgery and should not be taken for 2 weeks prior to and 1 week after surgery.  If you should need take something for relief of minor pain, you may take acetaminophen which is found in Tylenol,m Datril, Anacin-3 and Panadol. It is not blood thinner. The products listed below are.  Do not take any of the products listed below in addition to any listed on your instruction sheet.  A.P.C or A.P.C with Codeine Codeine Phosphate Capsules #3 Ibuprofen  Ridaura  ABC compound Congesprin Imuran rimadil  Advil Cope Indocin Robaxisal  Alka-Seltzer Effervescent Pain Reliever and Antacid Coricidin or Coricidin-D  Indomethacin Rufen  Alka-Seltzer plus Cold Medicine Cosprin Ketoprofen S-A-C Tablets  Anacin Analgesic Tablets or Capsules Coumadin Korlgesic Salflex  Anacin Extra Strength Analgesic tablets or capsules CP-2 Tablets Lanoril Salicylate  Anaprox Cuprimine Capsules Levenox Salocol  Anexsia-D Dalteparin Magan Salsalate  Anodynos Darvon compound Magnesium Salicylate Sine-off  Ansaid Dasin Capsules Magsal Sodium Salicylate  Anturane Depen Capsules Marnal Soma  APF Arthritis pain formula Dewitt's Pills Measurin Stanback  Argesic Dia-Gesic Meclofenamic Sulfinpyrazone  Arthritis Bayer Timed Release Aspirin Diclofenac Meclomen Sulindac  Arthritis pain formula Anacin Dicumarol Medipren Supac  Analgesic (Safety coated) Arthralgen Diffunasal Mefanamic Suprofen  Arthritis Strength Bufferin Dihydrocodeine Mepro Compound Suprol  Arthropan liquid Dopirydamole Methcarbomol with Aspirin Synalgos  ASA tablets/Enseals Disalcid Micrainin Tagament  Ascriptin Doan's Midol Talwin  Ascriptin A/D Dolene Mobidin Tanderil  Ascriptin Extra Strength Dolobid Moblgesic Ticlid  Ascriptin with Codeine Doloprin or  Doloprin with Codeine Momentum Tolectin  Asperbuf Duoprin Mono-gesic Trendar  Aspergum Duradyne Motrin or Motrin IB Triminicin  Aspirin plain, buffered or enteric coated Durasal Myochrisine Trigesic  Aspirin Suppositories Easprin Nalfon Trillsate  Aspirin with Codeine Ecotrin Regular or Extra Strength Naprosyn Uracel  Atromid-S Efficin Naproxen Ursinus  Auranofin Capsules Elmiron Neocylate Vanquish  Axotal Emagrin Norgesic Verin  Azathioprine Empirin or Empirin with Codeine Normiflo Vitamin E  Azolid Emprazil Nuprin Voltaren  Bayer Aspirin plain, buffered or children's or timed BC Tablets or powders Encaprin Orgaran Warfarin Sodium  Buff-a-Comp Enoxaparin Orudis Zorpin  Buff-a-Comp with Codeine Equegesic Os-Cal-Gesic   Buffaprin Excedrin plain, buffered or Extra Strength Oxalid   Bufferin Arthritis Strength Feldene Oxphenbutazone   Bufferin plain or Extra Strength Feldene Capsules Oxycodone with Aspirin   Bufferin with Codeine Fenoprofen Fenoprofen Pabalate or Pabalate-SF   Buffets II Flogesic Panagesic   Buffinol plain or Extra Strength Florinal or Florinal with Codeine Panwarfarin   Buf-Tabs Flurbiprofen Penicillamine   Butalbital Compound Four-way cold tablets Penicillin   Butazolidin Fragmin Pepto-Bismol   Carbenicillin Geminisyn Percodan   Carna Arthritis Reliever Geopen Persantine   Carprofen Gold's salt Persistin   Chloramphenicol Goody's Phenylbutazone   Chloromycetin Haltrain Piroxlcam   Clmetidine heparin Plaquenil   Cllnoril Hyco-pap Ponstel   Clofibrate Hydroxy chloroquine Propoxyphen         Before stopping any of these medications, be sure to consult the physician who ordered them.  Some, such as Coumadin (Warfarin) are ordered to prevent or treat serious conditions such as "deep thrombosis", "pumonary embolisms", and other heart problems.  The amount of time that you may need off of the medication may also vary with the medication and the reason for which you were  taking it.  If you are taking any of these medications, please make sure you notify your pain physician before you undergo any procedures.       Sacroiliac (SI) Joint Injection Patient Information  Description: The sacroiliac joint connects the scrum (very low back and tailbone) to the ilium (a pelvic bone which also forms half of the hip joint).  Normally this joint experiences very little motion.  When this joint becomes inflamed or unstable low back and or hip and pelvis pain may result.  Injection of this joint with local anesthetics (numbing medicines) and steroids can provide diagnostic information and reduce pain.  This injection is performed with the aid of x-ray guidance into the tailbone area while you are lying on your stomach.  You may experience an electrical sensation down the leg while this is being done.  You may also experience numbness.  We also may ask if we are reproducing your normal pain during the injection.  Conditions which may be treated SI injection:   Low back, buttock, hip or leg pain  Preparation for the Injection:  1. Do not eat any solid food or dairy products within 8 hours of your appointment.  2. You may drink clear liquids up to 3 hours before appointment.  Clear liquids include water, black coffee, juice or soda.  No milk or cream please. 3. You may take your regular medications, including pain medications with a sip of water before your appointment.  Diabetics should hold regular insulin (if take separately) and take 1/2 normal NPH dose the morning of the procedure.  Carry some sugar containing items with you to your appointment. 4. A driver must accompany you and be prepared to drive you home after your procedure. 5. Bring all of your current medications with you. 6. An IV may be inserted and sedation may be given at the discretion of the physician. 7. A blood pressure cuff, EKG and other monitors will often be applied during the procedure.  Some  patients may need to have extra oxygen administered for a short period.  8. You will be asked to provide medical information, including your allergies, prior to the procedure.  We must know immediately if you are taking blood thinners (like Coumadin/Warfarin) or if you are allergic to IV iodine contrast (dye).  We must know if you could possible be pregnant.  Possible side effects:   Bleeding from needle site  Infection (rare, may require surgery)  Nerve injury (rare)  Numbness & tingling (temporary)  A brief convulsion or seizure  Light-headedness (temporary)  Pain at injection site (several days)  Decreased blood pressure (temporary)  Weakness in the leg (temporary)   Call if you experience:   New onset weakness or numbness of an extremity below the injection site that last more than 8 hours.  Hives or difficulty breathing ( go to the emergency room)  Inflammation or drainage at the injection site  Any new symptoms which are concerning to you  Please note:  Although the local anesthetic injected can often make your back/ hip/ buttock/ leg feel good for several hours after the injections, the pain will likely return.  It takes 3-7 days for steroids to work in the sacroiliac area.  You may not notice any pain relief for at least that one week.  If effective, we will often do a series of three injections spaced 3-6 weeks apart to maximally decrease your pain.  After the initial series, we generally will wait some months before a repeat injection of the same type.  If you have any questions, please call [phone] [facility]  ____________________________________________________________________________________________  Medication Rules  Applies to: All patients receiving prescriptions (written or electronic).  Pharmacy of record: Pharmacy where electronic prescriptions will be sent. If written prescriptions are taken to a different  pharmacy, please inform the nursing staff. The pharmacy listed in the electronic medical record should be the one where you would like electronic prescriptions to be sent.  Prescription refills: Only during scheduled appointments. Applies to both, written and electronic prescriptions.  NOTE: The following applies primarily to controlled substances (Opioid* Pain Medications).   Patient's responsibilities: 12. Pain Pills: Bring all pain pills to every appointment (except for procedure appointments).  13. Pill Bottles: Bring pills in original pharmacy bottle. Always bring newest bottle. Bring bottle, even if empty. 14. Medication refills: You are responsible for knowing and keeping track of what medications you need refilled. The day before your appointment, write a list of all prescriptions that need to be refilled. Bring that list to your appointment and give it to the admitting nurse. Prescriptions will be written only during appointments. If you forget a medication, it will not be "Called in", "Faxed", or "electronically sent". You will need to get another appointment to get these prescribed. 15. Prescription Accuracy: You are responsible for carefully inspecting your prescriptions before leaving our office. Have the discharge nurse carefully go over each prescription with you, before taking them home. Make sure that your name is accurately spelled, that your address is correct. Check the name and dose of your medication to make sure it is accurate. Check the number of pills, and the written instructions to make sure they are clear and accurate. Make sure that you are given enough medication to last until your next medication refill appointment. 16. Taking Medication: Take medication as prescribed. Never take more pills than instructed. Never take medication more frequently than prescribed. Taking less pills or less frequently is permitted and encouraged, when it comes to controlled substances (written  prescriptions).  19. Inform other Doctors: Always inform, all of your healthcare providers, of all the medications you take. 18. Pain Medication from other Providers: You are not allowed to accept any additional pain medication from any other Doctor or Healthcare provider. There are two exceptions to this rule. (see below) In the event that you require additional pain medication, you are responsible for notifying us, as stated below. 19. Medication Agreement: You are responsible for carefully reading and following our Medication Agreement. This must be signed before receiving any prescriptions from our practice. Safely store a copy of your signed Agreement. Violations to the Agreement will result in no further prescriptions. (Additional copies of our Medication Agreement are available upon request.) 20. Laws, Rules, & Regulations: All patients are expected to follow all Federal and [employer], [employer], Rules, [employer]. Ignorance of the Laws does not constitute a valid excuse. The use of any illegal substances is prohibited. 21. Adopted CDC guidelines & recommendations: Target dosing levels will be at or below 60 MME/day. Use of benzodiazepines** is not recommended.  Exceptions: There are only two exceptions to the rule of not receiving pain medications from other Healthcare Providers. 9. Exception #1 (Emergencies): In the event of an emergency (i.e.: accident requiring emergency care), you are allowed to receive additional pain medication. However, you are responsible for: As soon as you are able, call our office [phone], at any time of the day or night, and leave a message stating your name, the date and nature of the emergency, and the name and dose of the medication prescribed. In the event that your call is answered by a member of our staff, make sure to document and save the date, time, and the name of the person that took your information.  10. Exception #2 (Planned Surgery): In the event  that you are scheduled by another doctor or dentist to have any type of surgery or procedure, you are allowed (for a period no longer than 30 days), to receive additional pain medication, for the acute post-op pain. However, in this case, you are responsible for picking up a copy of our "Post-op Pain Management for Surgeons" handout, and giving it  to your surgeon or dentist. This document is available at our office, and does not require an appointment to obtain it. Simply go to our office during business hours (Monday-Thursday from 8:00 AM to 4:00 PM) (Friday 8:00 AM to 12:00 Noon) or if you have a scheduled appointment with [place], prior to your surgery, and ask for it by name. In addition, you will need to provide [place] with your name, name of your surgeon, type of surgery, and date of procedure or surgery.  *Opioid medications include: morphine, codeine, oxycodone, oxymorphone, hydrocodone, hydromorphone, meperidine, tramadol, tapentadol, buprenorphine, fentanyl, methadone. **Benzodiazepine medications include: diazepam (Valium), alprazolam (Xanax), clonazepam (Klonopine), lorazepam (Ativan), clorazepate (Tranxene), chlordiazepoxide (Librium), estazolam (Prosom), oxazepam (Serax), temazepam (Restoril), triazolam (Halcion) (Last updated: 10/09/2017) ____________________________________________________________________________________________

## 2018-03-25 NOTE — Progress Notes (Signed)
Patient's Name: Kari Gibbs  MRN: [id number]  Referring Provider: Cari Caraway, MD  DOB: [birth year]  PCP: Cari Caraway, MD  DOS: 03/25/2018  Note by: Vevelyn Francois NP  Service setting: Ambulatory outpatient  Specialty: Interventional Pain Management  Location: [facility] (AMB) Pain Management Facility    Patient type: Established    Primary Reason(s) for Visit: Encounter for prescription drug management. (Level of risk: moderate)  CC: Abdominal Pain (left flank)  HPI  Kari Gibbs is a 67 y.o. year old, female patient, who comes today for a medication management evaluation. 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; S/P laparoscopic cholecystectomy Oct 2014; Fracture of rib of left side; Long term current use of opiate analgesic; Long term prescription opiate use; Opiate use (60 MME/day); Encounter for therapeutic drug level monitoring; Opiate dependence ([place]); Encounter for chronic pain management; Chronic pain syndrome; Chronic flank pain (Location of Primary Source of Pain) (Left); Chronic thoracic radicular pain (Left-sided); Opioid-induced constipation (OIC); Chronic occipital neuralgia (Left); Neuropathic pain; Neurogenic pain; History of rib fracture (Left 4th and 8th rib); Vertebral body hemangioma (T11); Metatarsalgia of right foot; Chronic abdominal pain (Bilateral) (L>R) (possible left-sided thoracic radiculopathy); Osteoarthritis; Return to work evaluation (FCE: Medium); Varicose veins of bilateral lower extremities with other complications; Varicose veins of left lower extremity with complications; Chronic neck pain (Bilateral) (L>R); Cervical facet syndrome (Bilateral) (L>R); Muscle spasticity; Tremors of nervous system; Muscle twitching; Cervical spondylosis; Chronic bilateral low back pain without sciatica; Acute costochondritis (Left); Morbid obesity ([place]); Chronic  bilateral low back pain with bilateral sciatica; and Chronic sacroiliac joint pain on their problem list. Her primarily concern today is the Abdominal Pain (left flank)  Pain Assessment: Location: Left Abdomen Radiating: denies Onset: More than a month ago Duration: Chronic pain Quality: Pressure Severity: 5 /10 (subjective, self-reported pain score)  Note: Reported level is compatible with observation.                          Effect on ADL:   Timing: Constant Modifying factors: meds BP: (!) 147/75  HR: 76  Kari Gibbs was last scheduled for an appointment on 12/23/2017 for medication management. During today's appointment we reviewed Kari Gibbs's chronic pain status, as well as her outpatient medication regimen.  Kari Gibbs admits that Kari Gibbs is having increased pain in her abdomen.  That it feels like someone is tightening the area.  Kari Gibbs admits that Kari Gibbs having problems with Gabapentin.  Kari Gibbs admits that Kari Gibbs is having problems with sedation during the day.  Kari Gibbs admits that Kari Gibbs is tried to wean off to 1 tablet twice daily however had to resort back to 2 tablets twice daily.  Kari Gibbs wonders if there are any additional options.  Kari Gibbs also admits that Kari Gibbs is having increased pain across her lower back.  Kari Gibbs admits that it goes down the backs of both legs.  Kari Gibbs admits that Kari Gibbs is having to shuffle her feet.  The patient  reports that Kari Gibbs does not use drugs. Her body mass index is 35.51 kg/m.  Further details on both, my assessment(s), as well as the proposed treatment plan, please see below.  Controlled Substance Pharmacotherapy Assessment REMS (Risk Evaluation and Mitigation Strategy)  Analgesic:MS Contin 30 mg every 12 hours (60 mg/day) MME/day:60 mg/day  Rise Patience, RN  03/25/2018  9:13 AM  Signed Nursing Pain Medication Assessment:  Safety precautions to be  maintained throughout the outpatient stay will include: orient to surroundings, keep bed in low position, maintain call bell within reach  at all times, provide assistance with transfer out of bed and ambulation.  Medication Inspection Compliance: Pill count conducted under aseptic conditions, in front of the patient. Neither the pills nor the bottle was removed from the patient's sight at any time. Once count was completed pills were immediately returned to the patient in their original bottle.  Medication: Morphine Pill/Patch Count: 1 of 60 pills remain Pill/Patch Appearance: Markings consistent with prescribed medication Bottle Appearance: Standard pharmacy container. Clearly labeled. Filled Date: 7 / 39 / 2019 Last Medication intake:  Today   Pharmacokinetics: Liberation and absorption (onset of action): WNL Distribution (time to peak effect): WNL Metabolism and excretion (duration of action): WNL         Pharmacodynamics: Desired effects: Analgesia: Kari Gibbs reports >50% benefit. Functional ability: Patient reports that medication allows her to accomplish basic ADLs Clinically meaningful improvement in function (CMIF): Sustained CMIF goals met Perceived effectiveness: Described as relatively effective, allowing for increase in activities of daily living (ADL) Undesirable effects: Side-effects or Adverse reactions: None reported Monitoring: [place] PMP: Online review of the past 33-monthperiod conducted. Compliant with practice rules and regulations Last UDS on record: Summary  Date Value Ref Range Status  09/25/2017 FINAL  Final    Comment:    ==================================================================== TOXASSURE SELECT 13 (MW) ==================================================================== Test                             Result       Flag       Units Drug Present and Declared for Prescription Verification   Morphine                       3259         EXPECTED   ng/mg creat   Normorphine                    303          EXPECTED   ng/mg creat    Potential sources of large amounts of morphine in the  absence of    codeine include administration of morphine or use of heroin.    Normorphine is an expected metabolite of morphine.   Hydromorphone                  97           EXPECTED   ng/mg creat    Hydromorphone may be present as a metabolite of morphine;    concentrations of hydromorphone rarely exceed 5% of the morphine    concentration when this is the source of hydromorphone. ==================================================================== Test                      Result    Flag   Units      Ref Range   Creatinine              151              mg/dL      >=20 ==================================================================== Declared Medications:  The flagging and interpretation on this report are based on the  following declared medications.  Unexpected results may arise from  inaccuracies in the declared medications.  **Note: The testing scope of this panel includes these medications:  Morphine (Morphine  Sulfate)  **Note: The testing scope of this panel does not include following  reported medications:  Buspirone  Cholecalciferol  Cyanocobalamin  Estrogen (Premarin)  Gabapentin  Irbesartan  Multivitamin (Prenatal)  Naloxone  Omeprazole  Supplement  Supplement (Omega-3)  Supplement (Probiotic)  Valsartan  Venlafaxine ==================================================================== For clinical consultation, please call [phone]. ====================================================================    UDS interpretation: Compliant          Medication Assessment Form: Reviewed. Patient indicates being compliant with therapy Treatment compliance: Compliant Risk Assessment Profile: Aberrant behavior: See prior evaluations. None observed or detected today Comorbid factors increasing risk of overdose: See prior notes. No additional risks detected today Opioid risk tool (ORT) (Total Score): 0 Personal History of Substance Abuse (SUD-Substance use  disorder):  Alcohol: Negative  Illegal Drugs: Negative  Rx Drugs: Negative  ORT Risk Level calculation: Low Risk Risk of substance use disorder (SUD): Low Opioid Risk Tool - 03/25/18 0922      Family History of Substance Abuse   Alcohol  Negative    Illegal Drugs  Negative    Rx Drugs  Negative      Personal History of Substance Abuse   Alcohol  Negative    Illegal Drugs  Negative    Rx Drugs  Negative      Age   Age between 80-45 years   No      Psychological Disease   Psychological Disease  Negative    Depression  Negative      Total Score   Opioid Risk Tool Scoring  0    Opioid Risk Interpretation  Low Risk      ORT Scoring interpretation table:  Score <3 = Low Risk for SUD  Score between 4-7 = Moderate Risk for SUD  Score >8 = High Risk for Opioid Abuse   Risk Mitigation Strategies:  Patient Counseling: Covered Patient-Prescriber Agreement (PPA): Present and active  Notification to other healthcare providers: Done  Pharmacologic Plan: No change in therapy, at this time.             Laboratory Chemistry  Inflammation Markers (CRP: Acute Phase) (ESR: Chronic Phase) Lab Results  Component Value Date   CRP <0.5 01/04/2016   ESRSEDRATE 17 01/04/2016                         Rheumatology Markers No results found for: RF, ANA, LABURIC, URICUR, LYMEIGGIGMAB, LYMEABIGMQN, HLAB27                      Renal Function Markers Lab Results  Component Value Date   BUN 20 01/04/2016   CREATININE 0.76 01/04/2016   GFRAA >60 01/04/2016   GFRNONAA >60 01/04/2016                             Hepatic Function Markers Lab Results  Component Value Date   AST 33 01/04/2016   ALT 26 01/04/2016   ALBUMIN 4.2 01/04/2016   ALKPHOS 97 01/04/2016   AMYLASE 30 02/27/2010   LIPASE 22 10/12/2012                        Electrolytes Lab Results  Component Value Date   NA 139 01/04/2016   K 4.6 01/04/2016   CL 99 (L) 01/04/2016   CALCIUM 9.7 01/04/2016   MG 2.1  01/04/2016  Neuropathy Markers Lab Results  Component Value Date   VITAMINB12 321 01/04/2016                        Bone Pathology Markers Lab Results  Component Value Date   25OHVITD1 49 01/04/2016   25OHVITD2 25 01/04/2016   25OHVITD3 24 01/04/2016                         Coagulation Parameters Lab Results  Component Value Date   PLT 238 11/18/2014   DDIMER  02/06/2010    <0.22        AT THE INHOUSE ESTABLISHED CUTOFF VALUE OF 0.48 ug/mL FEU, THIS ASSAY HAS BEEN DOCUMENTED IN THE LITERATURE TO HAVE A SENSITIVITY AND NEGATIVE PREDICTIVE VALUE OF AT LEAST 98 TO 99%.  THE TEST RESULT SHOULD BE CORRELATED WITH AN ASSESSMENT OF THE CLINICAL PROBABILITY OF DVT / VTE.                        Cardiovascular Markers Lab Results  Component Value Date   CKTOTAL 39 02/06/2010   CKMB 0.4 02/06/2010   TROPONINI 0.01        NO INDICATION OF MYOCARDIAL INJURY. 02/06/2010   HGB 11.1 (L) 11/18/2014   HCT 34.1 (L) 11/18/2014                         CA Markers No results found for: CEA, CA125, LABCA2                      Note: Lab results reviewed.  Recent Diagnostic Imaging Results  VAS [place] LOWER EXTREMITY VENOUS POST ABLATION  Lower Venous Study  Other Indications: Follow up left great saphenous vein ablation on 02/04/18.  Performing Technologist: Ralene Cork RVT    Examination Guidelines: A complete evaluation includes B-mode imaging, spectral Doppler, color Doppler, and power Doppler as needed of all accessible portions of each vessel. Bilateral testing is considered an integral part of a complete examination. Limited examinations for reoccurring indications may be performed as noted. The reflux portion of the exam is performed with the patient in reverse Trendelenburg.     Left Technical Findings: The GSV appears non compressible from the level of the knee to approximately 2.25 cm from the SFJ.      Final Interpretation: Left: There  is no evidence of deep vein thrombosis in the lower extremity.Successful vein closure.   *See table(s) above for measurements and observations.  Electronically signed by Deitra Mayo MD on 02/16/2018 at 10:58:51 AM.      Final    Complexity Note: Imaging results reviewed. Results shared with Kari Gibbs, using Layman's terms.                         Meds   Current Outpatient Medications:  .  busPIRone (BUSPAR) 15 MG tablet, TAKE 1/3 TO 1/2 TAB AS NEEDED FOR ANXIETY UP TO TWICE A DAY, Disp: , Rfl: 3 .  Cholecalciferol (VITAMIN D3) LIQD, Take 3 drops daily by mouth., Disp: , Rfl:  .  conjugated estrogens (PREMARIN) vaginal cream, Premarin 0.625 mg/gram vaginal cream  INSERT 1GRAM VAGINALLY 3 TIMES WEEKLY AT BEDTIME, Disp: , Rfl:  .  denosumab (PROLIA) 60 MG/ML SOSY injection, Inject 60 mg into the skin every 6 (six) months., Disp: , Rfl:  .  gabapentin (NEURONTIN) 300 MG capsule, Take 3 capsules (900 mg total) by mouth 2 (two) times daily for 15 days. May go up to 3 capsules 4 times a day. Titrate up slowly., Disp: 90 capsule, Rfl: 0 .  Magnesium 500 MG TABS, Take 1 tablet by mouth 2 (two) times daily., Disp: , Rfl:  .  montelukast (SINGULAIR) 10 MG tablet, Take 10 mg by mouth at bedtime., Disp: , Rfl:  .  [START ON 05/25/2018] morphine (MS CONTIN) 30 MG 12 hr tablet, Take 1 tablet (30 mg total) by mouth every 12 (twelve) hours., Disp: 60 tablet, Rfl: 0 .  naloxone (NARCAN) nasal spray 4 mg/0.1 mL, Spray into one nostril. Repeat with second device into other nostril after 2-3 minutes if no or minimal response. Use in case of opioid overdose., Disp: 1 kit, Rfl: 0 .  omeprazole (PRILOSEC) 20 MG capsule, Take 20 mg by mouth 2 (two) times daily before a meal., Disp: , Rfl:  .  PRENATAL VIT-FE FUM-FA-OMEGA PO, Take 1 tablet by mouth daily., Disp: , Rfl:  .  Probiotic Product (ALIGN) 4 MG CAPS, Take 1 capsule by mouth at bedtime. Per Pt., Disp: , Rfl:  .  vitamin B-12 (CYANOCOBALAMIN) 500  MCG tablet, Take 500 mcg by mouth 3 (three) times a week., Disp: , Rfl:  .  Wheat Dextrin (BENEFIBER) POWD, Stir 2 teaspoons of Benefiber into 4-8 oz of any non-carbonated beverage or soft food (hot or cold) TID., Disp: 500 g, Rfl: PRN .  [START ON 04/25/2018] morphine (MS CONTIN) 30 MG 12 hr tablet, Take 1 tablet (30 mg total) by mouth every 12 (twelve) hours., Disp: 60 tablet, Rfl: 0 .  [START ON 03/26/2018] morphine (MS CONTIN) 30 MG 12 hr tablet, Take 1 tablet (30 mg total) by mouth every 12 (twelve) hours., Disp: 60 tablet, Rfl: 0 .  pregabalin (LYRICA) 25 MG capsule, Take 1 capsule (25 mg total) by mouth 3 (three) times daily., Disp: 90 capsule, Rfl: 0  ROS  Constitutional: Denies any fever or chills Gastrointestinal: No reported hemesis, hematochezia, vomiting, or acute GI distress Musculoskeletal: Denies any acute onset joint swelling, redness, loss of ROM, or weakness Neurological: No reported episodes of acute onset apraxia, aphasia, dysarthria, agnosia, amnesia, paralysis, loss of coordination, or loss of consciousness  Allergies  Kari Gibbs is allergic to iohexol; ace inhibitors; losartan potassium; and neosporin [neomycin-bacitracin zn-polymyx].  [place]  Drug: Kari Gibbs  reports that Kari Gibbs does not use drugs. Alcohol:  reports that Kari Gibbs does not drink alcohol. Tobacco:  reports that Kari Gibbs has never smoked. Kari Gibbs has never used smokeless tobacco. 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 Family: family history includes Alzheimer's disease in her father; Hypertension in her mother.  Constitutional Exam  General appearance: alert, cooperative, in mild distress, moderately obese and pale Vitals:   03/25/18 0909  BP: (!) 147/75  Pulse: 76  Resp: 16  Temp: 97.6 F (36.4 C)  TempSrc: Oral  SpO2: 99%  Weight: 220 lb (99.8 kg)  Height: '5\' 6"'$  (1.676 m)  Psych/Mental status: Alert, oriented x 3 (person, place, & time)       Eyes: PERLA Respiratory: No evidence of acute respiratory distress Lumbar Spine Exam  Inspection: No masses, redness, or swelling Alignment: Symmetrical Functional ROM: Unrestricted ROM      Stability: No instability detected Muscle strength & Tone: Functionally intact Sensory: Unimpaired Palpation: Complains of area being tender to palpation       Provocative Tests: Lumbar Hyperextension and rotation test: evaluation deferred today due to pain. Patrick's Maneuver: evaluation deferred today             due to pain  Gait & Posture Assessment  Ambulation: Unassisted Gait: Relatively normal for age and body habitus Posture: WNL   Lower Extremity Exam    Side: Right lower extremity  Side: Left lower extremity  Stability: No instability observed          Stability: No instability observed          Skin & Extremity Inspection: Edema  Skin & Extremity Inspection: Edema  Functional ROM: Unrestricted ROM                  Functional ROM: Unrestricted ROM                  Muscle Tone/Strength: Functionally intact. No obvious neuro-muscular anomalies detected.  Muscle Tone/Strength: Functionally intact. No obvious  neuro-muscular anomalies detected.  Sensory (Neurological): Unimpaired  Sensory (Neurological): Unimpaired  Palpation: No palpable anomalies  Palpation: No palpable anomalies   Assessment  Primary Diagnosis & Pertinent Problem List: The primary encounter diagnosis was Chronic abdominal pain (Bilateral) (L>R) (possible left-sided thoracic radiculopathy). Diagnoses of Chronic bilateral low back pain with bilateral sciatica, Chronic sacroiliac joint pain, Spondylosis of cervical region without myelopathy or radiculopathy, Chronic pain syndrome, Therapeutic opioid-induced constipation (OIC), Neurogenic pain, and Long term prescription opiate use were also pertinent to this visit.  Status Diagnosis  Controlled Controlled Controlled 1. Chronic abdominal pain (Bilateral) (L>R) (possible left-sided thoracic radiculopathy)   2. Chronic bilateral low back pain with bilateral sciatica   3. Chronic sacroiliac joint pain   4. Spondylosis of cervical region without myelopathy or radiculopathy   5. Chronic pain syndrome   6. Therapeutic opioid-induced constipation (OIC)   7. Neurogenic pain   8. Long term prescription opiate use     Problems updated and reviewed during this visit: Problem  Chronic Bilateral Low Back Pain With Bilateral Sciatica  Chronic Sacroiliac Joint Pain   Plan of Care  Pharmacotherapy (Medications Ordered): Meds ordered this encounter  Medications  . morphine (MS CONTIN) 30 MG 12 hr tablet    Sig: Take 1 tablet (30 mg total) by mouth every 12 (twelve) hours.    Dispense:  60 tablet    Refill:  0    Do not place this medication, or any other prescription from our practice, on "Automatic Refill". Patient may have prescription filled one day early if pharmacy is closed on scheduled refill date. Do not fill until: 05/25/2018 To last until:06/24/2018    Order Specific Question:   Supervising Provider    Answer:   [name] [phone]  . morphine (MS CONTIN) 30 MG 12 hr  tablet    Sig: Take 1 tablet (30 mg total) by mouth every 12 (twelve) hours.    Dispense:  60 tablet    Refill:  0    Do not place this medication, or any other prescription from our practice, on "Automatic Refill". Patient may have prescription filled one day early if pharmacy is closed on scheduled refill date. Do not fill until: 04/25/2018 To last until: 05/25/2018    Order Specific Question:   Supervising Provider    Answer:   [name] [phone]  . morphine (MS CONTIN) 30 MG 12 hr tablet    Sig: Take 1 tablet (30 mg total) by mouth every 12 (twelve) hours.    Dispense:  60 tablet    Refill:  0    Do not place this medication, or any other prescription from our practice, on "Automatic Refill". Patient may have prescription filled one day early if pharmacy is closed on scheduled refill date. Do not fill until:03/26/2018 To last until: 04/25/2018    Order Specific Question:   Supervising Provider    Answer:   [name] [phone]  . Wheat Dextrin (BENEFIBER) POWD    Sig: Stir 2 teaspoons of Benefiber into 4-8 oz of any non-carbonated beverage or soft food (hot or cold) TID.    Dispense:  500 g    Refill:  PRN    Do not place this medication, or any other prescription from our practice, on "Automatic Refill". Patient may have prescription filled one day early if pharmacy is closed on scheduled refill date.    Order Specific Question:   Supervising Provider    Answer:   [name] [phone]  . pregabalin (LYRICA) 25 MG capsule    Sig: Take 1 capsule (25 mg total) by mouth 3 (three) times daily.    Dispense:  90 capsule    Refill:  0    Do not place this medication, or any other prescription from our practice, on "Automatic Refill". Patient may have prescription filled one day early if pharmacy is closed on scheduled refill date.    Order Specific Question:   Supervising Provider    Answer:   [name] [phone]  . gabapentin (NEURONTIN) 300 MG capsule     Sig: Take 3 capsules (900 mg total) by mouth 2 (two) times daily for 15 days. May go up to 3 capsules 4 times a day. Titrate up slowly.    Dispense:  90 capsule    Refill:  0    Do not place this medication, or any other prescription from our practice, on "Automatic Refill". Patient may have prescription filled one day early if pharmacy is closed on scheduled refill date.    Order Specific Question:   Supervising Provider    Answer:   [name] [phone]   New Prescriptions   PREGABALIN (LYRICA) 25 MG CAPSULE    Take 1 capsule (25 mg total) by mouth 3 (three) times daily.   Medications administered today: Jahniah Pallas had no medications administered during this visit. Lab-work, procedure(s), and/or referral(s): Orders Placed This Encounter  Procedures  . SACROILIAC JOINT INJECTION  . ToxASSURE Select 13 (MW), Urine   Imaging and/or referral(s): None  Planned, scheduled, and/or pending: Bilateral sacroiliac joint injection   Considering: Left greater occipital nerve RFA  Diagnostic left C2 + TON nerve block  Possible left C2 + TON   Palliative PRN treatment(s): Left greater occipital nerve RFA    Provider-requested follow-up: Return in about 3 months (around 06/25/2018) for MedMgmt with Me Dionisio David), in addition, Procedure(w/Sedation), w/ Dr. Dossie Arbour.  Future Appointments  Date Time Provider  [place]  04/02/2018 10:15 AM [name], MD [facility] None  04/22/2018  9:30 AM Vevelyn Francois, NP [facility] None  06/17/2018  9:45 AM Vevelyn Francois, NP [facility] None   Primary Care Physician: Cari Caraway, MD Location: [facility] Management Facility Note by: Vevelyn Francois NP Date: 03/25/2018; Time: 4:29 PM  Pain Score Disclaimer: We use the NRS-11 scale. This is a self-reported, subjective measurement of pain severity with only modest accuracy. It is used primarily to identify changes within a particular patient. It must be  understood that outpatient pain scales are significantly less accurate that those used for research, where they can be applied under ideal controlled circumstances with minimal exposure to variables. In reality, the score is likely to be a combination of pain intensity and pain affect, where pain affect describes the degree of emotional arousal or changes in action readiness caused by the sensory experience of pain. Factors such as social and work situation, setting, emotional state, anxiety levels, expectation, and prior pain experience may influence pain perception and show large inter-individual differences that may also be affected by time variables.  Patient instructions provided during this appointment: Patient Instructions   Benefiber and gabapentin have. been escribed to your pharmacy.  You have been given 3 Rx for morphine (MS contin) and Rx for Lyrica.  GENERAL RISKS AND COMPLICATIONS  What are the risk, side effects and possible complications? Generally speaking, most procedures are safe.  However, with any procedure there are risks, side effects, and the possibility of complications.  The risks and complications are dependent upon the sites that are lesioned, or the type of nerve block to be performed.  The closer the procedure is to the spine, the more serious the risks are.  Great care is taken when placing the radio frequency needles, block needles or lesioning probes, but sometimes complications can occur. 1. Infection: Any time there is an injection through the skin, there is a risk of infection.  This is why sterile conditions are used for these blocks.  There are four possible types of infection. 1. Localized skin infection. 2. Central Nervous System Infection-This can be in the form of Meningitis, which can be deadly. 3. Epidural Infections-This can be in the form of an epidural abscess, which can cause pressure inside of the spine, causing compression of the spinal cord with  subsequent paralysis. This would require an emergency surgery to decompress, and there are no guarantees that the patient would recover from the paralysis. 4. Discitis-This is an infection of the intervertebral discs.  It occurs in about 1% of discography procedures.  It is difficult to treat and it may lead to surgery.        2. Pain: the needles have to go through skin and soft tissues, will cause soreness.       3. Damage to internal structures:  The nerves to be lesioned may be near blood vessels or    other nerves which can be potentially damaged.       4. Bleeding: Bleeding is more common if the patient is taking blood thinners such as  aspirin, Coumadin, Ticiid, Plavix, etc., or if he/Kari Gibbs have some genetic predisposition  such as hemophilia. Bleeding into the spinal canal can cause compression of the spinal  cord with subsequent paralysis.  This would require an emergency surgery to  decompress and there are no guarantees that the patient would recover from the  paralysis.       5.  Pneumothorax:  Puncturing of a lung is a possibility, every time a needle is introduced in  the area of the chest or upper back.  Pneumothorax refers to free air around the  collapsed lung(s), inside of the thoracic cavity (chest cavity).  Another two possible  complications related to a similar event would include: Hemothorax and Chylothorax.   These are variations of the Pneumothorax, where instead of air around the collapsed  lung(s), you may have blood or chyle, respectively.       6. Spinal headaches: They may occur with any procedures in the area of the spine.       7. Persistent CSF (Cerebro-Spinal Fluid) leakage: This is a rare problem, but may occur  with prolonged intrathecal or epidural catheters either due to the formation of a fistulous  track or a dural tear.       8. Nerve damage: By working so close to the spinal cord, there is always a possibility of  nerve damage, which could be as serious as a permanent  spinal cord injury with  paralysis.       9. Death:  Although rare, severe deadly allergic reactions known as "Anaphylactic  reaction" can occur to any of the medications used.      10. Worsening of the symptoms:  We can always make thing worse.  What are the chances of something like this happening? Chances of any of this occuring are extremely low.  By statistics, you have more of a chance of getting killed in a motor vehicle accident: while driving to the hospital than any of the above occurring .  Nevertheless, you should be aware that they are possibilities.  In general, it is similar to taking a shower.  Everybody knows that you can slip, hit your head and get killed.  Does that mean that you should not shower again?  Nevertheless always keep in mind that statistics do not mean anything if you happen to be on the wrong side of them.  Even if a procedure has a 1 (one) in a 1,000,000 (million) chance of going wrong, it you happen to be that one..Also, keep in mind that by statistics, you have more of a chance of having something go wrong when taking medications.  Who should not have this procedure? If you are on a blood thinning medication (e.g. Coumadin, Plavix, see list of "Blood Thinners"), or if you have an active infection going on, you should not have the procedure.  If you are taking any blood thinners, please inform your physician.  How should I prepare for this procedure?  Do not eat or drink anything at least six hours prior to the procedure.  Bring a driver with you .  It cannot be a taxi.  Come accompanied by an adult that can drive you back, and that is strong enough to help you if your legs get weak or numb from the local anesthetic.  Take all of your medicines the morning of the procedure with just enough water to swallow them.  If you have diabetes, make sure that you are scheduled to have your procedure done first thing in the morning, whenever possible.  If you have  diabetes, take only half of your insulin dose and notify our nurse that you have done so as soon as you arrive at the clinic.  If you are diabetic, but only take blood sugar pills (oral hypoglycemic), then do not take them on the morning of your procedure.  You may take them after you have had the procedure.  Do not take aspirin or any aspirin-containing medications, at least eleven (11) days prior to the procedure.  They may prolong bleeding.  Wear loose fitting clothing that may be easy to take off and that you would not mind if it got stained with Betadine or blood.  Do not wear any jewelry or perfume  Remove any nail coloring.  It will interfere with some of our monitoring equipment.  NOTE: Remember that this is not meant to be interpreted as a complete list of all possible complications.  Unforeseen problems may occur.  BLOOD THINNERS The following drugs contain aspirin or other products, which can cause increased bleeding during surgery and should not be taken for 2 weeks prior to and 1 week after surgery.  If you should need take something for relief of minor pain, you may take acetaminophen which is found in Tylenol,m Datril, Anacin-3 and Panadol. It is not blood thinner. The products listed below are.  Do not take any of the products listed below in addition to any listed on your instruction sheet.  A.P.C or A.P.C with Codeine Codeine Phosphate Capsules #3 Ibuprofen Ridaura  ABC compound Congesprin Imuran rimadil  Advil Cope Indocin Robaxisal  Alka-Seltzer Effervescent Pain Reliever and Antacid Coricidin or Coricidin-D  Indomethacin Rufen  Alka-Seltzer plus Cold Medicine Cosprin Ketoprofen S-A-C Tablets  Anacin Analgesic Tablets or Capsules Coumadin Korlgesic Salflex  Anacin Extra Strength Analgesic tablets or capsules CP-2 Tablets Lanoril Salicylate  Anaprox Cuprimine Capsules Levenox Salocol  Anexsia-D Dalteparin Magan Salsalate  Anodynos Darvon compound Magnesium Salicylate  Sine-off  Ansaid Dasin Capsules Magsal Sodium Salicylate  Anturane Depen Capsules Marnal Soma  APF Arthritis pain formula Dewitt's Pills Measurin Stanback  Argesic Dia-Gesic Meclofenamic Sulfinpyrazone  Arthritis Bayer Timed Release Aspirin Diclofenac Meclomen Sulindac  Arthritis pain formula Anacin Dicumarol Medipren Supac  Analgesic (Safety coated) Arthralgen Diffunasal Mefanamic Suprofen  Arthritis Strength Bufferin Dihydrocodeine Mepro Compound Suprol  Arthropan liquid Dopirydamole Methcarbomol with Aspirin Synalgos  ASA tablets/Enseals Disalcid Micrainin Tagament  Ascriptin Doan's Midol Talwin  Ascriptin A/D Dolene Mobidin Tanderil  Ascriptin Extra Strength Dolobid Moblgesic Ticlid  Ascriptin with Codeine Doloprin or Doloprin with Codeine Momentum Tolectin  Asperbuf Duoprin Mono-gesic Trendar  Aspergum Duradyne Motrin or Motrin IB Triminicin  Aspirin plain, buffered or enteric coated Durasal Myochrisine Trigesic  Aspirin Suppositories Easprin Nalfon Trillsate  Aspirin with Codeine Ecotrin Regular or Extra Strength Naprosyn Uracel  Atromid-S Efficin Naproxen Ursinus  Auranofin Capsules Elmiron Neocylate Vanquish  Axotal Emagrin Norgesic Verin  Azathioprine Empirin or Empirin with Codeine Normiflo Vitamin E  Azolid Emprazil Nuprin Voltaren  Bayer Aspirin plain, buffered or children's or timed BC Tablets or powders Encaprin Orgaran Warfarin Sodium  Buff-a-Comp Enoxaparin Orudis Zorpin  Buff-a-Comp with Codeine Equegesic Os-Cal-Gesic   Buffaprin Excedrin plain, buffered or Extra Strength Oxalid   Bufferin Arthritis Strength Feldene Oxphenbutazone   Bufferin plain or Extra Strength Feldene Capsules Oxycodone with Aspirin   Bufferin with Codeine Fenoprofen Fenoprofen Pabalate or Pabalate-SF   Buffets II Flogesic Panagesic   Buffinol plain or Extra Strength Florinal or Florinal with Codeine Panwarfarin   Buf-Tabs Flurbiprofen Penicillamine   Butalbital Compound Four-way cold tablets  Penicillin   Butazolidin Fragmin Pepto-Bismol   Carbenicillin Geminisyn Percodan   Carna Arthritis Reliever Geopen Persantine   Carprofen Gold's salt Persistin   Chloramphenicol Goody's Phenylbutazone   Chloromycetin Haltrain Piroxlcam   Clmetidine heparin Plaquenil   Cllnoril Hyco-pap Ponstel   Clofibrate Hydroxy chloroquine  Propoxyphen         Before stopping any of these medications, be sure to consult the physician who ordered them.  Some, such as Coumadin (Warfarin) are ordered to prevent or treat serious conditions such as "deep thrombosis", "pumonary embolisms", and other heart problems.  The amount of time that you may need off of the medication may also vary with the medication and the reason for which you were taking it.  If you are taking any of these medications, please make sure you notify your pain physician before you undergo any procedures.       Sacroiliac (SI) Joint Injection Patient Information  Description: The sacroiliac joint connects the scrum (very low back and tailbone) to the ilium (a pelvic bone which also forms half of the hip joint).  Normally this joint experiences very little motion.  When this joint becomes inflamed or unstable low back and or hip and pelvis pain may result.  Injection of this joint with local anesthetics (numbing medicines) and steroids can provide diagnostic information and reduce pain.  This injection is performed with the aid of x-ray guidance into the tailbone area while you are lying on your stomach.   You may experience an electrical sensation down the leg while this is being done.  You may also experience numbness.  We also may ask if we are reproducing your normal pain during the injection.  Conditions which may be treated SI injection:   Low back, buttock, hip or leg pain  Preparation for the Injection:  1. Do not eat any solid food or dairy products within 8 hours of your appointment.  2. You may drink clear liquids up to 3  hours before appointment.  Clear liquids include water, black coffee, juice or soda.  No milk or cream please. 3. You may take your regular medications, including pain medications with a sip of water before your appointment.  Diabetics should hold regular insulin (if take separately) and take 1/2 normal NPH dose the morning of the procedure.  Carry some sugar containing items with you to your appointment. 4. A driver must accompany you and be prepared to drive you home after your procedure. 5. Bring all of your current medications with you. 6. An IV may be inserted and sedation may be given at the discretion of the physician. 7. A blood pressure cuff, EKG and other monitors will often be applied during the procedure.  Some patients may need to have extra oxygen administered for a short period.  8. You will be asked to provide medical information, including your allergies, prior to the procedure.  We must know immediately if you are taking blood thinners (like Coumadin/Warfarin) or if you are allergic to IV iodine contrast (dye).  We must know if you could possible be pregnant.  Possible side effects:   Bleeding from needle site  Infection (rare, may require surgery)  Nerve injury (rare)  Numbness & tingling (temporary)  A brief convulsion or seizure  Light-headedness (temporary)  Pain at injection site (several days)  Decreased blood pressure (temporary)  Weakness in the leg (temporary)   Call if you experience:   New onset weakness or numbness of an extremity below the injection site that last more than 8 hours.  Hives or difficulty breathing ( go to the emergency room)  Inflammation or drainage at the injection site  Any new symptoms which are concerning to you  Please note:  Although the local anesthetic injected can often make your  back/ hip/ buttock/ leg feel good for several hours after the injections, the pain will likely return.  It takes 3-7 days for steroids to  work in the sacroiliac area.  You may not notice any pain relief for at least that one week.  If effective, we will often do a series of three injections spaced 3-6 weeks apart to maximally decrease your pain.  After the initial series, we generally will wait some months before a repeat injection of the same type.  If you have any questions, please call [phone] [facility]  ____________________________________________________________________________________________  Medication Rules  Applies to: All patients receiving prescriptions (written or electronic).  Pharmacy of record: Pharmacy where electronic prescriptions will be sent. If written prescriptions are taken to a different pharmacy, please inform the nursing staff. The pharmacy listed in the electronic medical record should be the one where you would like electronic prescriptions to be sent.  Prescription refills: Only during scheduled appointments. Applies to both, written and electronic prescriptions.  NOTE: The following applies primarily to controlled substances (Opioid* Pain Medications).   Patient's responsibilities: 12. Pain Pills: Bring all pain pills to every appointment (except for procedure appointments). 13. Pill Bottles: Bring pills in original pharmacy bottle. Always bring newest bottle. Bring bottle, even if empty. 14. Medication refills: You are responsible for knowing and keeping track of what medications you need refilled. The day before your appointment, write a list of all prescriptions that need to be refilled. Bring that list to your appointment and give it to the admitting nurse. Prescriptions will be written only during appointments. If you forget a medication, it will not be "Called in", "Faxed", or "electronically sent". You will need to get another appointment to get these prescribed. 15. Prescription Accuracy: You are responsible for carefully inspecting your  prescriptions before leaving our office. Have the discharge nurse carefully go over each prescription with you, before taking them home. Make sure that your name is accurately spelled, that your address is correct. Check the name and dose of your medication to make sure it is accurate. Check the number of pills, and the written instructions to make sure they are clear and accurate. Make sure that you are given enough medication to last until your next medication refill appointment. 16. Taking Medication: Take medication as prescribed. Never take more pills than instructed. Never take medication more frequently than prescribed. Taking less pills or less frequently is permitted and encouraged, when it comes to controlled substances (written prescriptions).  34. Inform other Doctors: Always inform, all of your healthcare providers, of all the medications you take. 18. Pain Medication from other Providers: You are not allowed to accept any additional pain medication from any other Doctor or Healthcare provider. There are two exceptions to this rule. (see below) In the event that you require additional pain medication, you are responsible for notifying us, as stated below. 19. Medication Agreement: You are responsible for carefully reading and following our Medication Agreement. This must be signed before receiving any prescriptions from our practice. Safely store a copy of your signed Agreement. Violations to the Agreement will result in no further prescriptions. (Additional copies of our Medication Agreement are available upon request.) 20. Laws, Rules, & Regulations: All patients are expected to follow all Federal and [employer], [employer], Rules, [employer]. Ignorance of the Laws does not constitute a valid excuse. The use of any illegal substances is prohibited. 21. Adopted CDC guidelines & recommendations: Target dosing levels  will be at or below 60 MME/day. Use of benzodiazepines** is not  recommended.  Exceptions: There are only two exceptions to the rule of not receiving pain medications from other Healthcare Providers. 9. Exception #1 (Emergencies): In the event of an emergency (i.e.: accident requiring emergency care), you are allowed to receive additional pain medication. However, you are responsible for: As soon as you are able, call our office [phone], at any time of the day or night, and leave a message stating your name, the date and nature of the emergency, and the name and dose of the medication prescribed. In the event that your call is answered by a member of our staff, make sure to document and save the date, time, and the name of the person that took your information.  10. Exception #2 (Planned Surgery): In the event that you are scheduled by another doctor or dentist to have any type of surgery or procedure, you are allowed (for a period no longer than 30 days), to receive additional pain medication, for the acute post-op pain. However, in this case, you are responsible for picking up a copy of our "Post-op Pain Management for Surgeons" handout, and giving it to your surgeon or dentist. This document is available at our office, and does not require an appointment to obtain it. Simply go to our office during business hours (Monday-Thursday from 8:00 AM to 4:00 PM) (Friday 8:00 AM to 12:00 Noon) or if you have a scheduled appointment with [place], prior to your surgery, and ask for it by name. In addition, you will need to provide [place] with your name, name of your surgeon, type of surgery, and date of procedure or surgery.  *Opioid medications include: morphine, codeine, oxycodone, oxymorphone, hydrocodone, hydromorphone, meperidine, tramadol, tapentadol, buprenorphine, fentanyl, methadone. **Benzodiazepine medications include: diazepam (Valium), alprazolam (Xanax), clonazepam (Klonopine), lorazepam (Ativan), clorazepate (Tranxene), chlordiazepoxide (Librium), estazolam (Prosom),  oxazepam (Serax), temazepam (Restoril), triazolam (Halcion) (Last updated: 10/09/2017) ____________________________________________________________________________________________

## 2018-03-25 NOTE — Progress Notes (Signed)
Nursing Pain Medication Assessment:  Safety precautions to be maintained throughout the outpatient stay will include: orient to surroundings, keep bed in low position, maintain call bell within reach at all times, provide assistance with transfer out of bed and ambulation.  Medication Inspection Compliance: Pill count conducted under aseptic conditions, in front of the patient. Neither the pills nor the bottle was removed from the patient's sight at any time. Once count was completed pills were immediately returned to the patient in their original bottle.  Medication: Morphine Pill/Patch Count: 1 of 60 pills remain Pill/Patch Appearance: Markings consistent with prescribed medication Bottle Appearance: Standard pharmacy container. Clearly labeled. Filled Date: 7 / 51 / 2019 Last Medication intake:  Today

## 2018-03-31 LAB — TOXASSURE SELECT 13 (MW), URINE

## 2018-04-02 ENCOUNTER — Other Ambulatory Visit: Payer: Self-pay

## 2018-04-02 ENCOUNTER — Encounter: Payer: Self-pay | Admitting: Pain Medicine

## 2018-04-02 ENCOUNTER — Ambulatory Visit
Admission: RE | Admit: 2018-04-02 | Discharge: 2018-04-02 | Disposition: A | Discharge status: Home / Self Care | Payer: Medicare Other | Source: Ambulatory Visit | Attending: Pain Medicine | Admitting: Pain Medicine

## 2018-04-02 ENCOUNTER — Ambulatory Visit (HOSPITAL_BASED_OUTPATIENT_CLINIC_OR_DEPARTMENT_OTHER): Payer: Medicare Other | Admitting: Pain Medicine

## 2018-04-02 VITALS — BP 132/64 | HR 73 | Temp 97.1°F | Resp 18 | Ht 66.0 in | Wt 220.0 lb

## 2018-04-02 DIAGNOSIS — F419 Anxiety disorder, unspecified: Secondary | ICD-10-CM | POA: Diagnosis not present

## 2018-04-02 DIAGNOSIS — M533 Sacrococcygeal disorders, not elsewhere classified: Secondary | ICD-10-CM | POA: Diagnosis not present

## 2018-04-02 DIAGNOSIS — Z91041 Radiographic dye allergy status: Secondary | ICD-10-CM

## 2018-04-02 DIAGNOSIS — Z9884 Bariatric surgery status: Secondary | ICD-10-CM | POA: Insufficient documentation

## 2018-04-02 DIAGNOSIS — M549 Dorsalgia, unspecified: Secondary | ICD-10-CM | POA: Diagnosis present

## 2018-04-02 DIAGNOSIS — M545 Low back pain, unspecified: Secondary | ICD-10-CM

## 2018-04-02 DIAGNOSIS — Z9049 Acquired absence of other specified parts of digestive tract: Secondary | ICD-10-CM | POA: Insufficient documentation

## 2018-04-02 DIAGNOSIS — M9904 Segmental and somatic dysfunction of sacral region: Secondary | ICD-10-CM | POA: Insufficient documentation

## 2018-04-02 DIAGNOSIS — M5388 Other specified dorsopathies, sacral and sacrococcygeal region: Secondary | ICD-10-CM | POA: Insufficient documentation

## 2018-04-02 DIAGNOSIS — Z90710 Acquired absence of both cervix and uterus: Secondary | ICD-10-CM | POA: Insufficient documentation

## 2018-04-02 DIAGNOSIS — G8929 Other chronic pain: Secondary | ICD-10-CM | POA: Diagnosis not present

## 2018-04-02 DIAGNOSIS — Z888 Allergy status to other drugs, medicaments and biological substances status: Secondary | ICD-10-CM | POA: Diagnosis not present

## 2018-04-02 DIAGNOSIS — R109 Unspecified abdominal pain: Secondary | ICD-10-CM | POA: Diagnosis not present

## 2018-04-02 DIAGNOSIS — Z79891 Long term (current) use of opiate analgesic: Secondary | ICD-10-CM | POA: Diagnosis not present

## 2018-04-02 DIAGNOSIS — Z79899 Other long term (current) drug therapy: Secondary | ICD-10-CM | POA: Insufficient documentation

## 2018-04-02 MED ORDER — FENTANYL CITRATE (PF) 100 MCG/2ML IJ SOLN
25.0000 ug | INTRAMUSCULAR | Status: DC | PRN
  Administered 2018-04-02: 50 ug via INTRAVENOUS
  Filled 2018-04-02: qty 2

## 2018-04-02 MED ORDER — LACTATED RINGERS IV SOLN
1000.0000 mL | Freq: Once | INTRAVENOUS | Status: AC
  Administered 2018-04-02: 1000 mL via INTRAVENOUS

## 2018-04-02 MED ORDER — LIDOCAINE HCL 2 % IJ SOLN
20.0000 mL | Freq: Once | INTRAMUSCULAR | Status: AC
  Administered 2018-04-02: 400 mg
  Filled 2018-04-02: qty 40

## 2018-04-02 MED ORDER — MIDAZOLAM HCL 5 MG/5ML IJ SOLN
1.0000 mg | INTRAMUSCULAR | Status: DC | PRN
  Administered 2018-04-02: 1 mg via INTRAVENOUS
  Filled 2018-04-02: qty 5

## 2018-04-02 MED ORDER — METHYLPREDNISOLONE ACETATE 80 MG/ML IJ SUSP
80.0000 mg | Freq: Once | INTRAMUSCULAR | Status: AC
  Administered 2018-04-02: 80 mg via INTRA_ARTICULAR
  Filled 2018-04-02: qty 1

## 2018-04-02 MED ORDER — ROPIVACAINE HCL 2 MG/ML IJ SOLN
9.0000 mL | Freq: Once | INTRAMUSCULAR | Status: AC
  Administered 2018-04-02: 9 mL via INTRA_ARTICULAR
  Filled 2018-04-02: qty 10

## 2018-04-02 NOTE — Progress Notes (Signed)
Patient's Name: Kari Gibbs  MRN: [id number]  Referring Provider: Cari Caraway, MD  DOB: [birth year]  PCP: Cari Caraway, MD  DOS: 04/02/2018  Note by: Gaspar Cola, MD  Service setting: Ambulatory outpatient  Specialty: Interventional Pain Management  Patient type: Established  Location: [facility] (AMB) Pain Management Facility  Visit type: Interventional Procedure   Primary Reason for Visit: Interventional Pain Management Treatment. CC: Back Pain and Abdominal Pain (left)  Procedure:          Anesthesia, Analgesia, Anxiolysis:  Type: Diagnostic Sacroiliac Joint Steroid Injection #1  Region: Superior Lumbosacral Region Level: PSIS (Posterior Superior Iliac Spine) Laterality: Bilateral  Type: Moderate (Conscious) Sedation combined with Local Anesthesia Indication(s): Analgesia and Anxiety Route: Intravenous (IV) IV Access: Secured Sedation: Meaningful verbal contact was maintained at all times during the procedure  Local Anesthetic: Lidocaine 1-2%  Position: Prone           Indications: 1. Other specified dorsopathies, sacral and sacrococcygeal region   2. Chronic sacroiliac joint pain (Bilateral)   3. Chronic sacroiliac joint somatic dysfunction (Bilateral)   4. Chronic low back pain (Bilateral) w/o sciatica   5. History Allergy to Contrast    Pain Score: Pre-procedure: 4 /10 Post-procedure: 0-No pain/10  Pre-op Assessment:  Kari Gibbs is a 67 y.o. (year old), female patient, seen today for interventional treatment. 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 Kari Gibbs has a current medication list which includes the  following prescription(s): buspirone, vitamin d3, conjugated estrogens, denosumab, gabapentin, irbesartan, magnesium, montelukast, morphine, morphine, morphine, naloxone, omeprazole, pregabalin, prenatal vit-fe fum-fa-omega, align, vitamin b-12, and benefiber, and the following Facility-Administered Medications: fentanyl and midazolam. Her primarily concern today is the Back Pain and Abdominal Pain (left)  Initial Vital Signs:  Pulse/HCG Rate: 70ECG Heart Rate: 63 Temp: 98.3 F (36.8 C) Resp: 13 BP: (!) 143/62 SpO2: 99 %  BMI: Estimated body mass index is 35.51 kg/m as calculated from the following:   Height as of this encounter: 5\' 6"  (1.676 m).   Weight as of this encounter: 220 lb (99.8 kg).  Risk Assessment: Allergies: Reviewed. She is allergic to iohexol; ace inhibitors; losartan potassium; and neosporin [neomycin-bacitracin zn-polymyx].  Allergy Precautions: No radiological contrast used Coagulopathies: Reviewed. None identified.  Blood-thinner therapy: None at this time Active Infection(s): Reviewed. None identified. Kari Gibbs is afebrile  Site Confirmation: Kari Gibbs was asked to confirm the procedure and laterality before marking the site Procedure checklist: Completed Consent: Before the procedure and under the influence of no sedative(s), amnesic(s), or anxiolytics, the patient was informed of the treatment options, risks and possible complications. To fulfill our ethical and legal obligations, as recommended by the American Medical Association's Code of Ethics, I have informed the patient of my clinical impression; the nature and purpose of the treatment or procedure; the risks, benefits, and possible complications of the intervention; the alternatives, including doing nothing; the risk(s) and benefit(s) of the alternative treatment(s) or procedure(s); and the risk(s) and benefit(s) of doing nothing. The patient was provided information about the general risks and  possible complications associated with the procedure. These may include, but are not limited to: failure to achieve desired goals, infection, bleeding, organ or nerve damage, allergic reactions, paralysis, and death. In  addition, the patient was informed of those risks and complications associated to the procedure, such as failure to decrease pain; infection; bleeding; organ or nerve damage with subsequent damage to sensory, motor, and/or autonomic systems, resulting in permanent pain, numbness, and/or weakness of one or several areas of the body; allergic reactions; (i.e.: anaphylactic reaction); and/or death. Furthermore, the patient was informed of those risks and complications associated with the medications. These include, but are not limited to: allergic reactions (i.e.: anaphylactic or anaphylactoid reaction(s)); adrenal axis suppression; blood sugar elevation that in diabetics may result in ketoacidosis or comma; water retention that in patients with history of congestive heart failure may result in shortness of breath, pulmonary edema, and decompensation with resultant heart failure; weight gain; swelling or edema; medication-induced neural toxicity; particulate matter embolism and blood vessel occlusion with resultant organ, and/or nervous system infarction; and/or aseptic necrosis of one or more joints. Finally, the patient was informed that Medicine is not an exact science; therefore, there is also the possibility of unforeseen or unpredictable risks and/or possible complications that may result in a catastrophic outcome. The patient indicated having understood very clearly. We have given the patient no guarantees and we have made no promises. Enough time was given to the patient to ask questions, all of which were answered to the patient's satisfaction. Kari Gibbs has indicated that she wanted to continue with the procedure. Attestation: I, the ordering provider, attest that I have discussed with  the patient the benefits, risks, side-effects, alternatives, likelihood of achieving goals, and potential problems during recovery for the procedure that I have provided informed consent. Date  Time: 04/02/2018 10:06 AM  Pre-Procedure Preparation:  Monitoring: As per clinic protocol. Respiration, ETCO2, SpO2, BP, heart rate and rhythm monitor placed and checked for adequate function Safety Precautions: Patient was assessed for positional comfort and pressure points before starting the procedure. Time-out: I initiated and conducted the "Time-out" before starting the procedure, as per protocol. The patient was asked to participate by confirming the accuracy of the "Time Out" information. Verification of the correct person, site, and procedure were performed and confirmed by me, the nursing staff, and the patient. "Time-out" conducted as per Joint Commission's Universal Protocol (UP.01.01.01). Time: 1116  Description of Procedure:          Target Area: Superior, posterior, aspect of the sacroiliac fissure Approach: Posterior, paraspinal, ipsilateral approach. Area Prepped: Entire Lower Lumbosacral Region Prepping solution: ChloraPrep (2% chlorhexidine gluconate and 70% isopropyl alcohol) Safety Precautions: Aspiration looking for blood return was conducted prior to all injections. At no point did we inject any substances, as a needle was being advanced. No attempts were made at seeking any paresthesias. Safe injection practices and needle disposal techniques used. Medications properly checked for expiration dates. SDV (single dose vial) medications used. Description of the Procedure: Protocol guidelines were followed. The patient was placed in position over the procedure table. The target area was identified and the area prepped in the usual manner. Skin & deeper tissues infiltrated with local anesthetic. Appropriate amount of time allowed to pass for local anesthetics to take effect. The procedure  needle was advanced under fluoroscopic guidance into the sacroiliac joint until a firm endpoint was obtained. Proper needle placement secured. Negative aspiration confirmed. Solution injected in intermittent fashion, asking for systemic symptoms every 0.5cc of injectate. The needles were then removed and the area cleansed, making sure to leave some of the prepping solution back to take advantage of its long term bactericidal properties. Vitals:  04/02/18 1120 04/02/18 1130 04/02/18 1140 04/02/18 1149  BP: 115/76 (!) 132/58 130/61 132/64  Pulse: 73     Resp: 13 12 16 18   Temp:  (!) 97.2 F (36.2 C)  (!) 97.1 F (36.2 C)  SpO2: 99% 99% 99% 98%  Weight:      Height:        Start Time: 1116 hrs. End Time: 1120 hrs. Materials:  Needle(s) Type: Regular needle Gauge: 22G Length: 3.5-in Medication(s): Please see orders for medications and dosing details.  Imaging Guidance (Non-Spinal):          Type of Imaging Technique: Fluoroscopy Guidance (Non-Spinal) Indication(s): Assistance in needle guidance and placement for procedures requiring needle placement in or near specific anatomical locations not easily accessible without such assistance. Exposure Time: Please see nurses notes. Contrast: Before injecting any contrast, we confirmed that the patient did not have an allergy to iodine, shellfish, or radiological contrast. Once satisfactory needle placement was completed at the desired level, radiological contrast was injected. Contrast injected under live fluoroscopy. No contrast complications. See chart for type and volume of contrast used. Fluoroscopic Guidance: I was personally present during the use of fluoroscopy. "Tunnel Vision Technique" used to obtain the best possible view of the target area. Parallax error corrected before commencing the procedure. "Direction-depth-direction" technique used to introduce the needle under continuous pulsed fluoroscopy. Once target was reached,  antero-posterior, oblique, and lateral fluoroscopic projection used confirm needle placement in all planes. Images permanently stored in EMR. Interpretation: I personally interpreted the imaging intraoperatively. Adequate needle placement confirmed in multiple planes. Appropriate spread of contrast into desired area was observed. No evidence of afferent or efferent intravascular uptake. Permanent images saved into the patient's record.  Antibiotic Prophylaxis:   Anti-infectives (From admission, onward)   None     Indication(s): None identified  Post-operative Assessment:  Post-procedure Vital Signs:  Pulse/HCG Rate: 7365 Temp: (!) 97.1 F (36.2 C) Resp: 18 BP: 132/64 SpO2: 98 %  EBL: None  Complications: No immediate post-treatment complications observed by team, or reported by patient.  Note: The patient tolerated the entire procedure well. A repeat set of vitals were taken after the procedure and the patient was kept under observation following institutional policy, for this type of procedure. Post-procedural neurological assessment was performed, showing return to baseline, prior to discharge. The patient was provided with post-procedure discharge instructions, including a section on how to identify potential problems. Should any problems arise concerning this procedure, the patient was given instructions to immediately contact us, at any time, without hesitation. In any case, we plan to contact the patient by telephone for a follow-up status report regarding this interventional procedure.  Comments:  No additional relevant information.  Plan of Care   Interventional management options: Planned, scheduled, and/or pending:   Not at this time.   Considering:   Diagnosticleft costochondral joint injection  Palliative left T8 intercostal nerve blocks (T5 to T8) #2  Possible left intercostal nerve RFA  Palliative left greater occipital nerve block #3  Therapeutic left greater  occipital nerve RFA #1 Diagnostic left C2 + TON nerve block Possible left C2 + TON RFA  Diagnostic bilateral sacroiliac joint block #2  Possible bilateral sacroiliac joint RFA    Palliative PRN treatment(s):   Palliative left T8 intercostal nerve blocks (T5 to T8) #2  Possible left intercostal nerve RFA  Palliative left greater occipital nerve block #3  Therapeutic Left greater occipital nerve RFA    Imaging Orders  DG C-Arm 1-60 Min-No Report  Procedure Orders     SACROILIAC JOINT INJECTION  Medications ordered for procedure: Meds ordered this encounter  Medications  . lidocaine (XYLOCAINE) 2 % (with pres) injection 400 mg  . midazolam (VERSED) 5 MG/5ML injection 1-2 mg    Make sure Flumazenil is available in the pyxis when using this medication. If oversedation occurs, administer 0.2 mg IV over 15 sec. If after 45 sec no response, administer 0.2 mg again over 1 min; may repeat at 1 min intervals; not to exceed 4 doses (1 mg)  . fentaNYL (SUBLIMAZE) injection 25-50 mcg    Make sure Narcan is available in the pyxis when using this medication. In the event of respiratory depression (RR< 8/min): Titrate NARCAN (naloxone) in increments of 0.1 to 0.2 mg IV at 2-3 minute intervals, until desired degree of reversal.  . lactated ringers infusion 1,000 mL  . ropivacaine (PF) 2 mg/mL (0.2%) (NAROPIN) injection 9 mL  . methylPREDNISolone acetate (DEPO-MEDROL) injection 80 mg   Medications administered: We administered lidocaine, midazolam, fentaNYL, lactated ringers, ropivacaine (PF) 2 mg/mL (0.2%), and methylPREDNISolone acetate.  See the medical record for exact dosing, route, and time of administration.  New Prescriptions   No medications on file   Disposition: Discharge home  Discharge Date & Time: 04/02/2018; 1150 hrs.   Physician-requested Follow-up: Return for post-procedure eval (2 wks), w/ Dr. Dossie Arbour.  Future Appointments  Date Time Provider [place]   04/22/2018  9:30 AM Vevelyn Francois, NP [facility] None  06/17/2018  9:45 AM Vevelyn Francois, NP [facility] None   Primary Care Physician: Cari Caraway, MD Location: [facility] Note by: Gaspar Cola, MD Date: 04/02/2018; Time: 1:04 PM  Disclaimer:  Medicine is not an Chief Strategy Officer. The only guarantee in medicine is that nothing is guaranteed. It is important to note that the decision to proceed with this intervention was based on the information collected from the patient. The Data and conclusions were drawn from the patient's questionnaire, the interview, and the physical examination. Because the information was provided in large part by the patient, it cannot be guaranteed that it has not been purposely or unconsciously manipulated. Every effort has been made to obtain as much relevant data as possible for this evaluation. It is important to note that the conclusions that lead to this procedure are derived in large part from the available data. Always take into account that the treatment will also be dependent on availability of resources and existing treatment guidelines, considered by other Pain Management Practitioners as being common knowledge and practice, at the time of the intervention. For Medico-Legal purposes, it is also important to point out that variation in procedural techniques and pharmacological choices are the acceptable norm. The indications, contraindications, technique, and results of the above procedure should only be interpreted and judged by a Board-Certified Interventional Pain Specialist with extensive familiarity and expertise in the same exact procedure and technique.

## 2018-04-02 NOTE — Patient Instructions (Signed)

## 2018-04-03 ENCOUNTER — Telehealth: Payer: Self-pay | Admitting: *Deleted

## 2018-04-03 NOTE — Telephone Encounter (Signed)
Attempted to call for post procedure follow-up. Message left. 

## 2018-04-22 ENCOUNTER — Ambulatory Visit: Payer: Medicare Other | Attending: Nurse Practitioner | Admitting: Nurse Practitioner

## 2018-04-22 ENCOUNTER — Encounter: Payer: Self-pay | Admitting: Nurse Practitioner

## 2018-04-22 ENCOUNTER — Ambulatory Visit: Payer: Self-pay | Admitting: Pain Medicine

## 2018-04-22 VITALS — BP 135/93 | HR 83 | Temp 98.0°F | Resp 16 | Ht 66.0 in | Wt 220.0 lb

## 2018-04-22 DIAGNOSIS — R251 Tremor, unspecified: Secondary | ICD-10-CM | POA: Diagnosis not present

## 2018-04-22 DIAGNOSIS — Z91041 Radiographic dye allergy status: Secondary | ICD-10-CM | POA: Insufficient documentation

## 2018-04-22 DIAGNOSIS — Z5181 Encounter for therapeutic drug level monitoring: Secondary | ICD-10-CM | POA: Insufficient documentation

## 2018-04-22 DIAGNOSIS — M7741 Metatarsalgia, right foot: Secondary | ICD-10-CM | POA: Insufficient documentation

## 2018-04-22 DIAGNOSIS — Z820 Family history of epilepsy and other diseases of the nervous system: Secondary | ICD-10-CM | POA: Insufficient documentation

## 2018-04-22 DIAGNOSIS — I83892 Varicose veins of left lower extremities with other complications: Secondary | ICD-10-CM | POA: Insufficient documentation

## 2018-04-22 DIAGNOSIS — Z6835 Body mass index (BMI) 35.0-35.9, adult: Secondary | ICD-10-CM | POA: Diagnosis not present

## 2018-04-22 DIAGNOSIS — K589 Irritable bowel syndrome without diarrhea: Secondary | ICD-10-CM | POA: Diagnosis not present

## 2018-04-22 DIAGNOSIS — G894 Chronic pain syndrome: Secondary | ICD-10-CM | POA: Diagnosis not present

## 2018-04-22 DIAGNOSIS — Z79891 Long term (current) use of opiate analgesic: Secondary | ICD-10-CM | POA: Diagnosis not present

## 2018-04-22 DIAGNOSIS — Z79899 Other long term (current) drug therapy: Secondary | ICD-10-CM | POA: Diagnosis not present

## 2018-04-22 DIAGNOSIS — R1012 Left upper quadrant pain: Secondary | ICD-10-CM | POA: Insufficient documentation

## 2018-04-22 DIAGNOSIS — M5481 Occipital neuralgia: Secondary | ICD-10-CM | POA: Diagnosis not present

## 2018-04-22 DIAGNOSIS — Z87442 Personal history of urinary calculi: Secondary | ICD-10-CM | POA: Insufficient documentation

## 2018-04-22 DIAGNOSIS — Z8249 Family history of ischemic heart disease and other diseases of the circulatory system: Secondary | ICD-10-CM | POA: Insufficient documentation

## 2018-04-22 DIAGNOSIS — F329 Major depressive disorder, single episode, unspecified: Secondary | ICD-10-CM | POA: Diagnosis not present

## 2018-04-22 DIAGNOSIS — M9904 Segmental and somatic dysfunction of sacral region: Secondary | ICD-10-CM

## 2018-04-22 DIAGNOSIS — F419 Anxiety disorder, unspecified: Secondary | ICD-10-CM | POA: Insufficient documentation

## 2018-04-22 DIAGNOSIS — Z9049 Acquired absence of other specified parts of digestive tract: Secondary | ICD-10-CM | POA: Insufficient documentation

## 2018-04-22 DIAGNOSIS — Z8781 Personal history of (healed) traumatic fracture: Secondary | ICD-10-CM

## 2018-04-22 DIAGNOSIS — Z888 Allergy status to other drugs, medicaments and biological substances status: Secondary | ICD-10-CM | POA: Insufficient documentation

## 2018-04-22 DIAGNOSIS — K219 Gastro-esophageal reflux disease without esophagitis: Secondary | ICD-10-CM | POA: Insufficient documentation

## 2018-04-22 DIAGNOSIS — M5382 Other specified dorsopathies, cervical region: Secondary | ICD-10-CM | POA: Insufficient documentation

## 2018-04-22 DIAGNOSIS — M62838 Other muscle spasm: Secondary | ICD-10-CM | POA: Insufficient documentation

## 2018-04-22 DIAGNOSIS — R1011 Right upper quadrant pain: Secondary | ICD-10-CM

## 2018-04-22 DIAGNOSIS — M792 Neuralgia and neuritis, unspecified: Secondary | ICD-10-CM

## 2018-04-22 DIAGNOSIS — M47812 Spondylosis without myelopathy or radiculopathy, cervical region: Secondary | ICD-10-CM | POA: Diagnosis not present

## 2018-04-22 DIAGNOSIS — I10 Essential (primary) hypertension: Secondary | ICD-10-CM | POA: Diagnosis not present

## 2018-04-22 DIAGNOSIS — Z8701 Personal history of pneumonia (recurrent): Secondary | ICD-10-CM | POA: Diagnosis not present

## 2018-04-22 DIAGNOSIS — M1990 Unspecified osteoarthritis, unspecified site: Secondary | ICD-10-CM | POA: Insufficient documentation

## 2018-04-22 DIAGNOSIS — G8929 Other chronic pain: Secondary | ICD-10-CM

## 2018-04-22 DIAGNOSIS — Z90710 Acquired absence of both cervix and uterus: Secondary | ICD-10-CM | POA: Insufficient documentation

## 2018-04-22 DIAGNOSIS — M533 Sacrococcygeal disorders, not elsewhere classified: Secondary | ICD-10-CM | POA: Diagnosis not present

## 2018-04-22 DIAGNOSIS — Z9884 Bariatric surgery status: Secondary | ICD-10-CM | POA: Insufficient documentation

## 2018-04-22 MED ORDER — PREGABALIN 50 MG PO CAPS: 50.0000 mg | ORAL_CAPSULE | Freq: Three times a day (TID) | ORAL | 1 refills | Status: DC

## 2018-04-22 MED ORDER — GABAPENTIN 300 MG PO CAPS: 600.0000 mg | ORAL_CAPSULE | Freq: Every day | ORAL | 0 refills | Status: DC

## 2018-04-22 NOTE — Progress Notes (Signed)
Patient's Name: Kari Gibbs  MRN: [id number]  Referring Provider: Cari Caraway, MD  DOB: 07-10-1951  PCP: Cari Caraway, MD  DOS: 04/22/2018  Note by: Vevelyn Francois NP  Service setting: Ambulatory outpatient  Specialty: Interventional Pain Management  Location: [facility] (AMB) Pain Management Facility    Patient type: Established    Primary Reason(s) for Visit: Encounter for prescription drug management & post-procedure evaluation of chronic illness with mild to moderate exacerbation(Level of risk: moderate) CC: Abdominal Pain (LUQ); Wrist Pain (arthritis in in thumb joint ); and Back Pain (bilateral lumbar )  HPI  Kari Gibbs is a 67 y.o. year old, female patient, who comes today for a post-procedure evaluation and medication management. 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; S/P laparoscopic cholecystectomy Oct 2014; Fracture of rib (4th and 8th rib) (Left); Long term current use of opiate analgesic; Long term prescription opiate use; Opiate use (60 MME/day); Encounter for therapeutic drug level monitoring; Opiate dependence ([place]); Encounter for chronic pain management; Chronic pain syndrome; Chronic flank pain (Primary Source of Pain) (Left); Chronic thoracic radicular pain (Left-sided); Opioid-induced constipation (OIC); Chronic occipital neuralgia (Left); Neuropathic pain; Neurogenic pain; History of rib fracture (4th and 8th rib) (Left); Vertebral body hemangioma (T11); Metatarsalgia of foot (Right); Chronic abdominal pain (Bilateral) (L>R) (possible left-sided thoracic radiculopathy); Osteoarthritis; Return to work evaluation (FCE: Medium); Varicose veins of bilateral lower extremities with other complications; Varicose veins of left lower extremity with complications; Chronic neck pain (Bilateral) (L>R); Cervical facet syndrome (Bilateral) (L>R); Muscle spasticity; Tremors  of nervous system; Muscle twitching; Cervical spondylosis; Chronic low back pain (Bilateral) w/o sciatica; Acute costochondritis (Left); Morbid obesity ([place]); Chronic low back pain (Bilateral) w/ sciatica (Bilateral); Chronic sacroiliac joint pain (Bilateral); History Allergy to Contrast; Other specified dorsopathies, sacral and sacrococcygeal region; and Chronic sacroiliac joint somatic dysfunction (Bilateral) on their problem list. Her primarily concern today is the Abdominal Pain (LUQ); Wrist Pain (arthritis in in thumb joint ); and Back Pain (bilateral lumbar )  Pain Assessment: Location: Left(bilateral wrist and bilateral lumbar ) Abdomen(also wrist pain and back pain ) Radiating: abdominal pain radiating around to the back, back pain going down into legs  Onset: More than a month ago Duration: Chronic pain Quality: Aching, Discomfort, Heaviness, Tiring, Constant Severity: 6 /10 (subjective, self-reported pain score)  Note: Reported level is compatible with observation. Clinically the patient looks like a 3/10 A 3/10 is viewed as "Moderate" and described as significantly interfering with activities of daily living (ADL). It becomes difficult to feed, bathe, get dressed, get on and off the toilet or to perform personal hygiene functions. Difficult to get in and out of bed or a chair without assistance. Very distracting. With effort, it can be ignored when deeply involved in activities. Information on the proper use of the pain scale provided to the patient today. When using our objective Pain Scale, levels between 6 and 10/10 are said to belong in an emergency room, as it progressively worsens from a 6/10, described as severely limiting, requiring emergency care not usually available at an outpatient pain management facility. At a 6/10 level, communication becomes difficult and requires great effort. Assistance to reach the emergency department may be required. Facial flushing and profuse sweating along  with potentially dangerous increases in heart rate and blood pressure will be evident. Effect on ADL: patient unable to do anything, feels that she needs to still  Timing:  Constant Modifying factors: procedure helped for a couple of days.  staying still and holding pressure to abdomen.  patient is wearing wrist braces and has also had a cortisone injection to the left wrist done by ortho BP: (!) 135/93  HR: 83  Kari Gibbs was last seen on 8/232/2019 for a procedure. During today's appointment we reviewed Kari Gibbs's post-procedure results, as well as her outpatient medication regimen. She is having increased pain in the last 3 days. She has the left upper quadrant abdominal pain.  She has failed celiac plexus blocks in the past.  She is currently taking gabapentin along with a new trial of Lyrica.  She denies any side effects.  She feels like it has been somewhat effective but she is only taking the Lyrica at night.  She has continued to take the gabapentin 2 tablets twice daily.  She admits that the fogginess that she was feeling has resolved.  She denies any type of weight gain related to swelling.  She admits that she is not sleeping well.  Her husband states that she stays up late watching TV.  She has tried some sleeping techniques which help her to get a sleep however she is not staying asleep.  States that she has used melatonin in the past however it was not effective.  She does admit that it was a low dose of melatonin.   Further details on both, my assessment(s), as well as the proposed treatment plan, please see below.  Post-Procedure Assessment  04/02/2018 Procedure: Bilateral sacroiliac joint injection Pre-procedure pain score:  4/10 Post-procedure pain score: 0/10         Influential Factors: BMI: 35.51 kg/m Intra-procedural challenges: None observed.         Assessment challenges: None detected.              Reported side-effects: None.        Post-procedural adverse  reactions or complications: None reported         Sedation: Please see nurses note. When no sedatives are used, the analgesic levels obtained are directly associated to the effectiveness of the local anesthetics. However, when sedation is provided, the level of analgesia obtained during the initial 1 hour following the intervention, is believed to be the result of a combination of factors. These factors may include, but are not limited to: 1. The effectiveness of the local anesthetics used. 2. The effects of the analgesic(s) and/or anxiolytic(s) used. 3. The degree of discomfort experienced by the patient at the time of the procedure. 4. The patients ability and reliability in recalling and recording the events. 5. The presence and influence of possible secondary gains and/or psychosocial factors. Reported result: Relief experienced during the 1st hour after the procedure: 100 % (Ultra-Short Term Relief)            Interpretative annotation: Clinically appropriate result. Analgesia during this period is likely to be Local Anesthetic and/or IV Sedative (Analgesic/Anxiolytic) related.          Effects of local anesthetic: The analgesic effects attained during this period are directly associated to the localized infiltration of local anesthetics and therefore cary significant diagnostic value as to the etiological location, or anatomical origin, of the pain. Expected duration of relief is directly dependent on the pharmacodynamics of the local anesthetic used. Long-acting (4-6 hours) anesthetics used.  Reported result: Relief during the next 4 to 6 hour after the procedure: 80 % (Short-Term Relief)  Interpretative annotation: Clinically appropriate result. Analgesia during this period is likely to be Local Anesthetic-related.          Long-term benefit: Defined as the period of time past the expected duration of local anesthetics (1 hour for short-acting and 4-6 hours for long-acting). With the  possible exception of prolonged sympathetic blockade from the local anesthetics, benefits during this period are typically attributed to, or associated with, other factors such as analgesic sensory neuropraxia, antiinflammatory effects, or beneficial biochemical changes provided by agents other than the local anesthetics.  Reported result: Extended relief following procedure: 70 %(patient states pain is better, still difficult to bend ) (Long-Term Relief)            Interpretative annotation: Clinically possible results. Good relief. No permanent benefit expected. Inflammation plays a part in the etiology to the pain.          Current benefits: Defined as reported results that persistent at this point in time.   Analgesia: >50 %            Function: Somewhat improved ROM: Somewhat improved Interpretative annotation: Ongoing benefit. No permanent benefit expected. Effective diagnostic intervention.          Interpretation: Results would suggest a successful diagnostic intervention.                  Plan:  Please see "Plan of Care" for details.                Laboratory Chemistry  Inflammation Markers (CRP: Acute Phase) (ESR: Chronic Phase) Lab Results  Component Value Date   CRP <0.5 01/04/2016   ESRSEDRATE 17 01/04/2016                         Rheumatology Markers No results found for: RF, ANA, LABURIC, URICUR, LYMEIGGIGMAB, LYMEABIGMQN, HLAB27                      Renal Function Markers Lab Results  Component Value Date   BUN 20 01/04/2016   CREATININE 0.76 01/04/2016   GFRAA >60 01/04/2016   GFRNONAA >60 01/04/2016                             Hepatic Function Markers Lab Results  Component Value Date   AST 33 01/04/2016   ALT 26 01/04/2016   ALBUMIN 4.2 01/04/2016   ALKPHOS 97 01/04/2016   AMYLASE 30 02/27/2010   LIPASE 22 10/12/2012                        Electrolytes Lab Results  Component Value Date   NA 139 01/04/2016   K 4.6 01/04/2016   CL 99 (L) 01/04/2016    CALCIUM 9.7 01/04/2016   MG 2.1 01/04/2016                        Neuropathy Markers Lab Results  Component Value Date   VITAMINB12 321 01/04/2016                        CNS Tests Lab Results  Component Value Date   SDES URINE, CLEAN CATCH 02/05/2010   CULT  02/05/2010    Multiple bacterial morphotypes present, none predominant. Suggest appropriate recollection if clinically indicated.  Bone Pathology Markers Lab Results  Component Value Date   25OHVITD1 49 01/04/2016   25OHVITD2 25 01/04/2016   25OHVITD3 24 01/04/2016                         Coagulation Parameters Lab Results  Component Value Date   PLT 238 11/18/2014   DDIMER  02/06/2010    <0.22        AT THE INHOUSE ESTABLISHED CUTOFF VALUE OF 0.48 ug/mL FEU, THIS ASSAY HAS BEEN DOCUMENTED IN THE LITERATURE TO HAVE A SENSITIVITY AND NEGATIVE PREDICTIVE VALUE OF AT LEAST 98 TO 99%.  THE TEST RESULT SHOULD BE CORRELATED WITH AN ASSESSMENT OF THE CLINICAL PROBABILITY OF DVT / VTE.                        Cardiovascular Markers Lab Results  Component Value Date   CKTOTAL 39 02/06/2010   CKMB 0.4 02/06/2010   TROPONINI 0.01        NO INDICATION OF MYOCARDIAL INJURY. 02/06/2010   HGB 11.1 (L) 11/18/2014   HCT 34.1 (L) 11/18/2014                         CA Markers No results found for: CEA, CA125, LABCA2                      Note: Lab results reviewed.  Recent Diagnostic Imaging Results  DG C-Arm 1-60 Min-No Report Fluoroscopy was utilized by the requesting physician.  No radiographic  interpretation.   Complexity Note: Imaging results reviewed. Results shared with Ms. Izora Ribas, using Layman's terms.                         Meds   Current Outpatient Medications:  .  busPIRone (BUSPAR) 15 MG tablet, TAKE 1/3 TO 1/2 TAB AS NEEDED FOR ANXIETY UP TO TWICE A DAY, Disp: , Rfl: 3 .  Cholecalciferol (VITAMIN D3) LIQD, Take 3 drops daily by mouth., Disp: , Rfl:  .  conjugated  estrogens (PREMARIN) vaginal cream, Premarin 0.625 mg/gram vaginal cream  INSERT 1GRAM VAGINALLY 3 TIMES WEEKLY AT BEDTIME, Disp: , Rfl:  .  denosumab (PROLIA) 60 MG/ML SOSY injection, Inject 60 mg into the skin every 6 (six) months., Disp: , Rfl:  .  irbesartan (AVAPRO) 75 MG tablet, Take 75 mg by mouth daily., Disp: , Rfl:  .  Magnesium 500 MG TABS, Take 1 tablet by mouth 2 (two) times daily., Disp: , Rfl:  .  montelukast (SINGULAIR) 10 MG tablet, Take 10 mg by mouth at bedtime., Disp: , Rfl:  .  [START ON 05/25/2018] morphine (MS CONTIN) 30 MG 12 hr tablet, Take 1 tablet (30 mg total) by mouth every 12 (twelve) hours., Disp: 60 tablet, Rfl: 0 .  [START ON 04/25/2018] morphine (MS CONTIN) 30 MG 12 hr tablet, Take 1 tablet (30 mg total) by mouth every 12 (twelve) hours., Disp: 60 tablet, Rfl: 0 .  morphine (MS CONTIN) 30 MG 12 hr tablet, Take 1 tablet (30 mg total) by mouth every 12 (twelve) hours., Disp: 60 tablet, Rfl: 0 .  naloxone (NARCAN) nasal spray 4 mg/0.1 mL, Spray into one nostril. Repeat with second device into other nostril after 2-3 minutes if no or minimal response. Use in case of opioid overdose., Disp: 1 kit, Rfl: 0 .  omeprazole (PRILOSEC) 20 MG capsule,  Take 20 mg by mouth 2 (two) times daily before a meal., Disp: , Rfl:  .  pregabalin (LYRICA) 25 MG capsule, Take 1 capsule (25 mg total) by mouth 3 (three) times daily. (Patient taking differently: Take 25 mg by mouth 2 (two) times daily. ), Disp: 90 capsule, Rfl: 0 .  PRENATAL VIT-FE FUM-FA-OMEGA PO, Take 1 tablet by mouth daily., Disp: , Rfl:  .  Probiotic Product (ALIGN) 4 MG CAPS, Take 1 capsule by mouth at bedtime. Per Pt., Disp: , Rfl:  .  promethazine-dextromethorphan (PROMETHAZINE-DM) 6.25-15 MG/5ML syrup, Take 5 mLs by mouth as needed., Disp: , Rfl: 0 .  vitamin B-12 (CYANOCOBALAMIN) 500 MCG tablet, Take 500 mcg by mouth 3 (three) times a week., Disp: , Rfl:  .  Wheat Dextrin (BENEFIBER) POWD, Stir 2 teaspoons of Benefiber  into 4-8 oz of any non-carbonated beverage or soft food (hot or cold) TID., Disp: 500 g, Rfl: PRN .  gabapentin (NEURONTIN) 300 MG capsule, Take 2 capsules (600 mg total) by mouth daily. Taking 2 in the morning, Disp: 60 capsule, Rfl: 0 .  pregabalin (LYRICA) 50 MG capsule, Take 1 capsule (50 mg total) by mouth 3 (three) times daily., Disp: 90 capsule, Rfl: 1  ROS  Constitutional: Denies any fever or chills Gastrointestinal: No reported hemesis, hematochezia, vomiting, or acute GI distress Musculoskeletal: Denies any acute onset joint swelling, redness, loss of ROM, or weakness Neurological: No reported episodes of acute onset apraxia, aphasia, dysarthria, agnosia, amnesia, paralysis, loss of coordination, or loss of consciousness  Allergies  Ms. Huxford is allergic to iohexol; ace inhibitors; losartan potassium; and neosporin [neomycin-bacitracin zn-polymyx].  [place]  Drug: Ms. Guerin  reports that she does not use drugs. Alcohol:  reports that she does not drink alcohol. Tobacco:  reports that she has never smoked. She has never used smokeless tobacco. 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 Surgical: Ms. 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 Family: family history includes Alzheimer's disease in her father; Hypertension in her mother.  Constitutional Exam  General appearance: Well nourished, well developed, and well hydrated. In no apparent acute distress Vitals:   04/22/18 0937  BP: (!) 135/93  Pulse: 83  Resp: 16  Temp: 98 F (36.7 C)  TempSrc: Oral  SpO2: 100%  Weight: 220 lb (99.8 kg)  Height: '5\' 6"'$  (1.676 m)  Psych/Mental status: Alert, oriented x 3 (person, place, & time)       Eyes: PERLA Respiratory: No evidence of acute respiratory distress  Cervical Spine Area Exam  Skin & Axial Inspection: No masses, redness, edema, swelling, or associated skin lesions Alignment: Symmetrical Functional ROM: Unrestricted ROM      Stability: No instability detected Muscle Tone/Strength: Functionally intact. No obvious neuro-muscular anomalies detected. Sensory (Neurological): Unimpaired Palpation: No palpable anomalies              Upper Extremity (UE) Exam    Side:  Right upper extremity  Side: Left upper extremity  Skin & Extremity Inspection: Skin color, temperature, and hair growth are WNL. No peripheral edema or cyanosis. No masses, redness, swelling, asymmetry, or associated skin lesions. No contractures.  Skin & Extremity Inspection: Skin color, temperature, and hair growth are WNL. No peripheral edema or cyanosis. No masses, redness, swelling, asymmetry, or associated skin lesions. No contractures.  Functional ROM: Unrestricted ROM          Functional ROM: Unrestricted ROM          Muscle Tone/Strength: Functionally intact. No obvious neuro-muscular anomalies detected.  Muscle Tone/Strength: Functionally intact. No obvious neuro-muscular anomalies detected.  Sensory (Neurological): Unimpaired          Sensory (Neurological): Unimpaired          Palpation: No palpable anomalies              Palpation: No palpable anomalies              Provocative Test(s):  Phalen's test:  deferred Tinel's test: deferred Apley's scratch test (touch opposite shoulder):  Action 1 (Across chest): deferred Action 2 (Overhead): deferred Action 3 (LB reach): deferred   Provocative Test(s):  Phalen's test: deferred Tinel's test: deferred Apley's scratch test (touch opposite shoulder):  Action 1 (Across chest): deferred Action 2 (Overhead): deferred Action 3 (LB reach): deferred    Thoracic Spine Area Exam  Skin & Axial Inspection: No masses, redness, or swelling Alignment: Symmetrical Functional ROM: Unrestricted ROM Stability: No instability detected Muscle Tone/Strength: Functionally intact. No obvious neuro-muscular anomalies detected. Sensory (Neurological): Unimpaired Muscle strength & Tone: No palpable anomalies  Lumbar Spine Area Exam  Skin & Axial Inspection: No masses, redness, or swelling Alignment: Symmetrical Functional ROM: Unrestricted ROM       Stability: No instability detected Muscle Tone/Strength: Functionally intact. No obvious neuro-muscular anomalies detected. Sensory (Neurological): Unimpaired Palpation: No palpable anomalies       Provocative Tests: Hyperextension/rotation test: deferred today       Lumbar quadrant test (Kemp's test): deferred today       Lateral bending test: deferred today       Patrick's Maneuver: deferred today                   FABER test: deferred today                   S-I anterior distraction/compression test: deferred today         S-I lateral compression test: deferred today         S-I Thigh-thrust test: deferred today         S-I Gaenslen's test: deferred today          Gait & Posture Assessment  Ambulation: Unassisted Gait: Relatively normal for age and body habitus Posture: WNL   Lower Extremity Exam    Side: Right lower extremity  Side: Left lower extremity  Stability: No instability observed          Stability: No instability observed          Skin & Extremity Inspection: Skin color, temperature, and  hair growth are WNL. No peripheral edema or cyanosis. No masses, redness, swelling, asymmetry, or associated skin lesions. No contractures.  Skin & Extremity Inspection: Skin color, temperature, and hair growth are WNL. No peripheral edema or cyanosis. No masses, redness, swelling, asymmetry, or associated skin lesions. No contractures.  Functional ROM: Unrestricted ROM  Functional ROM: Unrestricted ROM                  Muscle Tone/Strength: Functionally intact. No obvious neuro-muscular anomalies detected.  Muscle Tone/Strength: Functionally intact. No obvious neuro-muscular anomalies detected.  Sensory (Neurological): Unimpaired  Sensory (Neurological): Unimpaired  Palpation: No palpable anomalies  Palpation: No palpable anomalies   Assessment  Primary Diagnosis & Pertinent Problem List: The primary encounter diagnosis was Chronic abdominal pain (Bilateral) (L>R) (possible left-sided thoracic radiculopathy). Diagnoses of History of rib fracture (4th and 8th rib) (Left), Chronic sacroiliac joint somatic dysfunction (Bilateral), Neurogenic pain, and Chronic pain syndrome were also pertinent to this visit.  Status Diagnosis  Worsening Persistent Controlled 1. Chronic abdominal pain (Bilateral) (L>R) (possible left-sided thoracic radiculopathy)   2. History of rib fracture (4th and 8th rib) (Left)   3. Chronic sacroiliac joint somatic dysfunction (Bilateral)   4. Neurogenic pain   5. Chronic pain syndrome     Problems updated and reviewed during this visit: No problems updated. Plan of Care  Pharmacotherapy (Medications Ordered): Meds ordered this encounter  Medications  . pregabalin (LYRICA) 50 MG capsule    Sig: Take 1 capsule (50 mg total) by mouth 3 (three) times daily.    Dispense:  90 capsule    Refill:  1    Order Specific Question:   Supervising Provider    Answer:   [name] [phone]  . gabapentin (NEURONTIN) 300 MG capsule    Sig: Take 2 capsules  (600 mg total) by mouth daily. Taking 2 in the morning    Dispense:  60 capsule    Refill:  0    Do not place this medication, or any other prescription from our practice, on "Automatic Refill". Patient may have prescription filled one day early if pharmacy is closed on scheduled refill date.    Order Specific Question:   Supervising Provider    Answer:   [name] [phone]   New Prescriptions   PREGABALIN (LYRICA) 50 MG CAPSULE    Take 1 capsule (50 mg total) by mouth 3 (three) times daily.   Medications administered today: Caran Storck had no medications administered during this visit. Lab-work, procedure(s), and/or referral(s): No orders of the defined types were placed in this encounter.  Imaging and/or referral(s): None  Planned, scheduled, and/or pending: Not at this time.  We will increase the Lyrica to 50 mg twice daily she will continue to use what she has currently and then refill the new prescription.  She will continue the gabapentin 2 tablets in the mornings only doing this escalation process so she can wean completely off of the gabapentin.  We will further discuss the melatonin on next visit if it is necessary for insomnia.  Recommended melatonin 20 mg nightly to be prescribed in the future.   Considering: Left greater occipital nerve RFA  Diagnostic left C2 + TON nerve block  Possible left C2 + TON   Palliative PRN treatment(s): Left greater occipital nerve RFA    Provider-requested follow-up: Return for Appointment As Scheduled.  Future Appointments  Date Time Provider [place]  06/17/2018  9:45 AM Vevelyn Francois, NP [facility] None   Primary Care Physician: Cari Caraway, MD Location: [facility] Out[facility] Management [facility] Note by: Vevelyn Francois NP Date: 04/22/2018; Time: 10:40 AM  Pain Score Disclaimer: We use the NRS-11 scale. This is a self-reported, subjective measurement of pain severity with only modest accuracy. It  is used primarily to identify changes within  a particular patient. It must be understood that outpatient pain scales are significantly less accurate that those used for research, where they can be applied under ideal controlled circumstances with minimal exposure to variables. In reality, the score is likely to be a combination of pain intensity and pain affect, where pain affect describes the degree of emotional arousal or changes in action readiness caused by the sensory experience of pain. Factors such as social and work situation, setting, emotional state, anxiety levels, expectation, and prior pain experience may influence pain perception and show large inter-individual differences that may also be affected by time variables.  Patient instructions provided during this appointment: Patient Instructions   ____________________________________________________________________________________________  Pain Scale  Introduction: The pain score used by this practice is the Verbal Numerical Rating Scale (VNRS-11). This is an 11-point scale. It is for adults and children 10 years or older. There are significant differences in how the pain score is reported, used, and applied. Forget everything you learned in the past and learn this scoring system.  General Information: The scale should reflect your current level of pain. Unless you are specifically asked for the level of your worst pain, or your average pain. If you are asked for one of these two, then it should be understood that it is over the past 24 hours.  Basic Activities of Daily Living (ADL): Personal hygiene, dressing, eating, transferring, and using restroom.  Instructions: Most patients tend to report their level of pain as a combination of two factors, their physical pain and their psychosocial pain. This last one is also known as "suffering" and it is reflection of how physical pain affects you socially and psychologically. From now on, report  them separately. From this point on, when asked to report your pain level, report only your physical pain. Use the following table for reference.  Pain Clinic Pain Levels (0-5/10)  Pain Level Score  Description  No Pain 0   Mild pain 1 Nagging, annoying, but does not interfere with basic activities of daily living (ADL). Patients are able to eat, bathe, get dressed, toileting (being able to get on and off the toilet and perform personal hygiene functions), transfer (move in and out of bed or a chair without assistance), and maintain continence (able to control bladder and bowel functions). Blood pressure and heart rate are unaffected. A normal heart rate for a healthy adult ranges from 60 to 100 bpm (beats per minute).   Mild to moderate pain 2 Noticeable and distracting. Impossible to hide from other people. More frequent flare-ups. Still possible to adapt and function close to normal. It can be very annoying and may have occasional stronger flare-ups. With discipline, patients may get used to it and adapt.   Moderate pain 3 Interferes significantly with activities of daily living (ADL). It becomes difficult to feed, bathe, get dressed, get on and off the toilet or to perform personal hygiene functions. Difficult to get in and out of bed or a chair without assistance. Very distracting. With effort, it can be ignored when deeply involved in activities.   Moderately severe pain 4 Impossible to ignore for more than a few minutes. With effort, patients may still be able to manage work or participate in some social activities. Very difficult to concentrate. Signs of autonomic nervous system discharge are evident: dilated pupils (mydriasis); mild sweating (diaphoresis); sleep interference. Heart rate becomes elevated (>115 bpm). Diastolic blood pressure (lower number) rises above 100 mmHg. Patients find relief in laying down and  not moving.   Severe pain 5 Intense and extremely unpleasant. Associated with  frowning face and frequent crying. Pain overwhelms the senses.  Ability to do any activity or maintain social relationships becomes significantly limited. Conversation becomes difficult. Pacing back and forth is common, as getting into a comfortable position is nearly impossible. Pain wakes you up from deep sleep. Physical signs will be obvious: pupillary dilation; increased sweating; goosebumps; brisk reflexes; cold, clammy hands and feet; nausea, vomiting or dry heaves; loss of appetite; significant sleep disturbance with inability to fall asleep or to remain asleep. When persistent, significant weight loss is observed due to the complete loss of appetite and sleep deprivation.  Blood pressure and heart rate becomes significantly elevated. Caution: If elevated blood pressure triggers a pounding headache associated with blurred vision, then the patient should immediately seek attention at an urgent or emergency care unit, as these may be signs of an impending stroke.    Emergency Department Pain Levels (6-10/10)  Emergency Room Pain 6 Severely limiting. Requires emergency care and should not be seen or managed at an outpatient pain management facility. Communication becomes difficult and requires great effort. Assistance to reach the emergency department may be required. Facial flushing and profuse sweating along with potentially dangerous increases in heart rate and blood pressure will be evident.   Distressing pain 7 Self-care is very difficult. Assistance is required to transport, or use restroom. Assistance to reach the emergency department will be required. Tasks requiring coordination, such as bathing and getting dressed become very difficult.   Disabling pain 8 Self-care is no longer possible. At this level, pain is disabling. The individual is unable to do even the most "basic" activities such as walking, eating, bathing, dressing, transferring to a bed, or toileting. Fine motor skills are lost. It  is difficult to think clearly.   Incapacitating pain 9 Pain becomes incapacitating. Thought processing is no longer possible. Difficult to remember your own name. Control of movement and coordination are lost.   The worst pain imaginable 10 At this level, most patients pass out from pain. When this level is reached, collapse of the autonomic nervous system occurs, leading to a sudden drop in blood pressure and heart rate. This in turn results in a temporary and dramatic drop in blood flow to the brain, leading to a loss of consciousness. Fainting is one of the body's self defense mechanisms. Passing out puts the brain in a calmed state and causes it to shut down for a while, in order to begin the healing process.    Summary: 1. Refer to this scale when providing [place] with your pain level. 2. Be accurate and careful when reporting your pain level. This will help with your care. 3. Over-reporting your pain level will lead to loss of credibility. 4. Even a level of 1/10 means that there is pain and will be treated at our facility. 5. High, inaccurate reporting will be documented as "Symptom Exaggeration", leading to loss of credibility and suspicions of possible secondary gains such as obtaining more narcotics, or wanting to appear disabled, for fraudulent reasons. 6. Only pain levels of 5 or below will be seen at our facility. 7. Pain levels of 6 and above will be sent to the Emergency Department and the appointment cancelled. ____________________________________________________________________________________________    BMI Assessment: Estimated body mass index is 35.51 kg/m as calculated from the following:   Height as of this encounter: '5\' 6"'$  (1.676 m).   Weight as of this encounter:  220 lb (99.8 kg).  BMI interpretation table: BMI level Category Range association with higher incidence of chronic pain  <18 kg/m2 Underweight   18.5-24.9 kg/m2 Ideal body weight   25-29.9 kg/m2 Overweight  Increased incidence by 20%  30-34.9 kg/m2 Obese (Class I) Increased incidence by 68%  35-39.9 kg/m2 Severe obesity (Class II) Increased incidence by 136%  >40 kg/m2 Extreme obesity (Class III) Increased incidence by 254%   Patient's current BMI Ideal Body weight  Body mass index is 35.51 kg/m. Ideal body weight: 59.3 kg (130 lb 11.7 oz) Adjusted ideal body weight: 75.5 kg (166 lb 7 oz)   BMI Readings from Last 4 Encounters:  04/22/18 35.51 kg/m  04/02/18 35.51 kg/m  03/25/18 35.51 kg/m  02/16/18 35.67 kg/m   Wt Readings from Last 4 Encounters:  04/22/18 220 lb (99.8 kg)  04/02/18 220 lb (99.8 kg)  03/25/18 220 lb (99.8 kg)  02/16/18 221 lb (100.2 kg)

## 2018-04-22 NOTE — Progress Notes (Signed)
Nursing Pain Medication Assessment:  Safety precautions to be maintained throughout the outpatient stay will include: orient to surroundings, keep bed in low position, maintain call bell within reach at all times, provide assistance with transfer out of bed and ambulation.  Medication Inspection Compliance: Pill count conducted under aseptic conditions, in front of the patient. Neither the pills nor the bottle was removed from the patient's sight at any time. Once count was completed pills were immediately returned to the patient in their original bottle.  Medication: Morphine ER (MSContin) Pill/Patch Count: 5 of 60 pills remain Pill/Patch Appearance: Markings consistent with prescribed medication Bottle Appearance: Standard pharmacy container. Clearly labeled. Filled Date: 08 / 15 / 2019 Last Medication intake:  Today

## 2018-04-22 NOTE — Patient Instructions (Addendum)
____________________________________________________________________________________________  Pain Scale  Introduction: The pain score used by this practice is the Verbal Numerical Rating Scale (VNRS-11). This is an 11-point scale. It is for adults and children 10 years or older. There are significant differences in how the pain score is reported, used, and applied. Forget everything you learned in the past and learn this scoring system.  General Information: The scale should reflect your current level of pain. Unless you are specifically asked for the level of your worst pain, or your average pain. If you are asked for one of these two, then it should be understood that it is over the past 24 hours.  Basic Activities of Daily Living (ADL): Personal hygiene, dressing, eating, transferring, and using restroom.  Instructions: Most patients tend to report their level of pain as a combination of two factors, their physical pain and their psychosocial pain. This last one is also known as "suffering" and it is reflection of how physical pain affects you socially and psychologically. From now on, report them separately. From this point on, when asked to report your pain level, report only your physical pain. Use the following table for reference.  Pain Clinic Pain Levels (0-5/10)  Pain Level Score  Description  No Pain 0   Mild pain 1 Nagging, annoying, but does not interfere with basic activities of daily living (ADL). Patients are able to eat, bathe, get dressed, toileting (being able to get on and off the toilet and perform personal hygiene functions), transfer (move in and out of bed or a chair without assistance), and maintain continence (able to control bladder and bowel functions). Blood pressure and heart rate are unaffected. A normal heart rate for a healthy adult ranges from 60 to 100 bpm (beats per minute).   Mild to moderate pain 2 Noticeable and distracting. Impossible to hide from other  people. More frequent flare-ups. Still possible to adapt and function close to normal. It can be very annoying and may have occasional stronger flare-ups. With discipline, patients may get used to it and adapt.   Moderate pain 3 Interferes significantly with activities of daily living (ADL). It becomes difficult to feed, bathe, get dressed, get on and off the toilet or to perform personal hygiene functions. Difficult to get in and out of bed or a chair without assistance. Very distracting. With effort, it can be ignored when deeply involved in activities.   Moderately severe pain 4 Impossible to ignore for more than a few minutes. With effort, patients may still be able to manage work or participate in some social activities. Very difficult to concentrate. Signs of autonomic nervous system discharge are evident: dilated pupils (mydriasis); mild sweating (diaphoresis); sleep interference. Heart rate becomes elevated (>115 bpm). Diastolic blood pressure (lower number) rises above 100 mmHg. Patients find relief in laying down and not moving.   Severe pain 5 Intense and extremely unpleasant. Associated with frowning face and frequent crying. Pain overwhelms the senses.  Ability to do any activity or maintain social relationships becomes significantly limited. Conversation becomes difficult. Pacing back and forth is common, as getting into a comfortable position is nearly impossible. Pain wakes you up from deep sleep. Physical signs will be obvious: pupillary dilation; increased sweating; goosebumps; brisk reflexes; cold, clammy hands and feet; nausea, vomiting or dry heaves; loss of appetite; significant sleep disturbance with inability to fall asleep or to remain asleep. When persistent, significant weight loss is observed due to the complete loss of appetite and sleep deprivation.  Blood   pressure and heart rate becomes significantly elevated. Caution: If elevated blood pressure triggers a pounding headache  associated with blurred vision, then the patient should immediately seek attention at an urgent or emergency care unit, as these may be signs of an impending stroke.    Emergency Department Pain Levels (6-10/10)  Emergency Room Pain 6 Severely limiting. Requires emergency care and should not be seen or managed at an outpatient pain management facility. Communication becomes difficult and requires great effort. Assistance to reach the emergency department may be required. Facial flushing and profuse sweating along with potentially dangerous increases in heart rate and blood pressure will be evident.   Distressing pain 7 Self-care is very difficult. Assistance is required to transport, or use restroom. Assistance to reach the emergency department will be required. Tasks requiring coordination, such as bathing and getting dressed become very difficult.   Disabling pain 8 Self-care is no longer possible. At this level, pain is disabling. The individual is unable to do even the most "basic" activities such as walking, eating, bathing, dressing, transferring to a bed, or toileting. Fine motor skills are lost. It is difficult to think clearly.   Incapacitating pain 9 Pain becomes incapacitating. Thought processing is no longer possible. Difficult to remember your own name. Control of movement and coordination are lost.   The worst pain imaginable 10 At this level, most patients pass out from pain. When this level is reached, collapse of the autonomic nervous system occurs, leading to a sudden drop in blood pressure and heart rate. This in turn results in a temporary and dramatic drop in blood flow to the brain, leading to a loss of consciousness. Fainting is one of the body's self defense mechanisms. Passing out puts the brain in a calmed state and causes it to shut down for a while, in order to begin the healing process.    Summary: 1. Refer to this scale when providing [place] with your pain level. 2. Be  accurate and careful when reporting your pain level. This will help with your care. 3. Over-reporting your pain level will lead to loss of credibility. 4. Even a level of 1/10 means that there is pain and will be treated at our facility. 5. High, inaccurate reporting will be documented as "Symptom Exaggeration", leading to loss of credibility and suspicions of possible secondary gains such as obtaining more narcotics, or wanting to appear disabled, for fraudulent reasons. 6. Only pain levels of 5 or below will be seen at our facility. 7. Pain levels of 6 and above will be sent to the Emergency Department and the appointment cancelled. ____________________________________________________________________________________________    BMI Assessment: Estimated body mass index is 35.51 kg/m as calculated from the following:   Height as of this encounter: 5\' 6"  (1.676 m).   Weight as of this encounter: 220 lb (99.8 kg).  BMI interpretation table: BMI level Category Range association with higher incidence of chronic pain  <18 kg/m2 Underweight   18.5-24.9 kg/m2 Ideal body weight   25-29.9 kg/m2 Overweight Increased incidence by 20%  30-34.9 kg/m2 Obese (Class I) Increased incidence by 68%  35-39.9 kg/m2 Severe obesity (Class II) Increased incidence by 136%  >40 kg/m2 Extreme obesity (Class III) Increased incidence by 254%   Patient's current BMI Ideal Body weight  Body mass index is 35.51 kg/m. Ideal body weight: 59.3 kg (130 lb 11.7 oz) Adjusted ideal body weight: 75.5 kg (166 lb 7 oz)   BMI Readings from Last 4 Encounters:  04/22/18 35.51  kg/m  04/02/18 35.51 kg/m  03/25/18 35.51 kg/m  02/16/18 35.67 kg/m   Wt Readings from Last 4 Encounters:  04/22/18 220 lb (99.8 kg)  04/02/18 220 lb (99.8 kg)  03/25/18 220 lb (99.8 kg)  02/16/18 221 lb (100.2 kg)

## 2018-04-27 ENCOUNTER — Telehealth: Payer: Self-pay | Admitting: Nurse Practitioner

## 2018-04-27 ENCOUNTER — Ambulatory Visit: Payer: Self-pay | Admitting: Pain Medicine

## 2018-04-27 NOTE — Telephone Encounter (Signed)
Patient lvmail on Fri asking if it is ok for her to have CT at 10am Monday then come to have her procedure.  Kori asked Dr. Holley Raring and he said ok for this to be done. Tried to call patient, no answer and vmail  Is full, unable to leave msg.

## 2018-04-30 ENCOUNTER — Telehealth: Payer: Self-pay | Admitting: Pain Medicine

## 2018-04-30 ENCOUNTER — Other Ambulatory Visit: Payer: Self-pay | Admitting: Nurse Practitioner

## 2018-04-30 MED ORDER — PREGABALIN 25 MG PO CAPS: 25.0000 mg | ORAL_CAPSULE | Freq: Two times a day (BID) | ORAL | 2 refills | Status: DC

## 2018-04-30 NOTE — Telephone Encounter (Signed)
This is documentation on wrong chart.

## 2018-04-30 NOTE — Telephone Encounter (Signed)
Refill for the Lyrica 25mg  BID sent in  Lyrica 50 mg Discontinued

## 2018-04-30 NOTE — Telephone Encounter (Signed)
Patient unable to take 50 mg capsules of Lyrica. She is taking the 25mg   2 times a day. Taking same amount of Gabapentin in the morning. At 3pm and 9pm she is taking Lyrica. She will need refill for the 25mg .  Please call to discuss

## 2018-04-30 NOTE — Telephone Encounter (Signed)
Tried to call patient to let her know she can do both. Got answering machine both times, mailbox full, no way to leave msg.

## 2018-06-05 ENCOUNTER — Other Ambulatory Visit: Payer: Self-pay | Admitting: Nurse Practitioner

## 2018-06-05 DIAGNOSIS — F1190 Opioid use, unspecified, uncomplicated: Secondary | ICD-10-CM

## 2018-06-05 DIAGNOSIS — Z79891 Long term (current) use of opiate analgesic: Secondary | ICD-10-CM

## 2018-06-05 DIAGNOSIS — M792 Neuralgia and neuritis, unspecified: Secondary | ICD-10-CM

## 2018-06-08 ENCOUNTER — Telehealth: Payer: Self-pay | Admitting: Nurse Practitioner

## 2018-06-08 NOTE — Telephone Encounter (Signed)
Patient went to phys for coughing and sick, is now on levoquin.

## 2018-06-08 NOTE — Telephone Encounter (Signed)
Patient states she was just calling to inform us of medication.

## 2018-06-09 ENCOUNTER — Encounter: Payer: Self-pay | Admitting: Emergency Medicine

## 2018-06-09 ENCOUNTER — Ambulatory Visit: Payer: Medicare Other | Admitting: Emergency Medicine

## 2018-06-09 DIAGNOSIS — R053 Chronic cough: Secondary | ICD-10-CM | POA: Insufficient documentation

## 2018-06-09 DIAGNOSIS — J849 Interstitial pulmonary disease, unspecified: Secondary | ICD-10-CM | POA: Diagnosis not present

## 2018-06-09 DIAGNOSIS — R9389 Abnormal findings on diagnostic imaging of other specified body structures: Secondary | ICD-10-CM

## 2018-06-09 DIAGNOSIS — R05 Cough: Secondary | ICD-10-CM | POA: Diagnosis not present

## 2018-06-09 MED ORDER — BENZONATATE 200 MG PO CAPS: 200.0000 mg | ORAL_CAPSULE | Freq: Four times a day (QID) | ORAL | 1 refills | Status: DC | PRN

## 2018-06-09 MED ORDER — PANTOPRAZOLE SODIUM 40 MG PO TBEC: 40.0000 mg | DELAYED_RELEASE_TABLET | Freq: Every day | ORAL | 5 refills | Status: DC

## 2018-06-09 NOTE — Patient Instructions (Signed)
We will perform a CT scan of your chest with high-resolution cuts, no contrast to evaluate for any evidence of scarring or inflammation. Please stop omeprazole temporarily. Please start pantoprazole 40 mg twice a day.  Take this medication 1 hour before eating. Please continue Singulair 10 mg each evening. Please continue your nasal steroid spray, 2 sprays each nostril once daily. Start loratadine 10 mg (generic Claritin) once daily until next visit. Try doing nasal saline rinses once daily to clear out your nasal cavities and sinuses Continue to use Promethazine DM as needed for cough suppression. Start Tessalon Perles 200 mg up to every 6 hours as needed for cough suppression. You will need to work on voice rest, avoiding throat clearing or extra coughing. Depending on how your cough is doing we will decide whether you need any other testing such as breathing tests or airway evaluation. Follow with Dr Lamonte Sakai in 1 month or next available.

## 2018-06-09 NOTE — Assessment & Plan Note (Signed)
Appears to be multifactorial but with a significant contribution from GERD, LPR.  She has a history of gastric bypass and multiple gastric/abdominal surgeries.  She experiences dysphasia, episodic regurgitation.  We will try to treat GERD more aggressively, supplement her rhinitis regimen  Please stop omeprazole temporarily. Please start pantoprazole 40 mg twice a day.  Take this medication 1 hour before eating. Please continue Singulair 10 mg each evening. Please continue your nasal steroid spray, 2 sprays each nostril once daily. Start loratadine 10 mg (generic Claritin) once daily until next visit. Try doing nasal saline rinses once daily to clear out your nasal cavities and sinuses Continue to use Promethazine DM as needed for cough suppression. Start Tessalon Perles 200 mg up to every 6 hours as needed for cough suppression. You will need to work on voice rest, avoiding throat clearing or extra coughing. Depending on how your cough is doing we will decide whether you need any other testing such as breathing tests or airway evaluation.

## 2018-06-09 NOTE — Assessment & Plan Note (Signed)
Chest x-ray showed some bibasilar atelectatic change and scar, performed during her work-up for chronic cough.  I reviewed the chest x-ray and also previous abdominal and chest CT scans.  There was some linear atelectasis present on the prior scans.  I think it would be reasonable to perform a repeat CT scan of the chest to directly compare, and ensure no interval change, no developing groundglass or progressive inflammation.  I suspect that the findings are chronic.  No evidence to support an active clinical pneumonia, and she has been treated with antibiotics at least twice during this syndrome.  I will ask her to stop the levofloxacin

## 2018-06-09 NOTE — Progress Notes (Signed)
Subjective:    Patient ID: Kari Gibbs, female    DOB: June 25, 1951, 67 y.o.   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 She has chronic abd pain from this. She notes that she was hospitalized as a child for PNA. She has also had intermittent nasal congestion and flares of bronchitis, more bothersome   She is referred today for evaluation of chronic cough that started about a month ago, associated with nasal congestion, barking cough. Treated with abx, didn't take the pred that was prescribed. Was started on nasal steroid, singulair. Most recently she was given levaquin based on the CXR below.   She is on omeprazole 39m bid, but she has breakthrough reflux. She sometimes has dysphagia, but no overt aspiration sx.   [facility] was reviewed.  This showed grossly normal airflows with a normal flow volume loop.  A chest x-ray done on 06/04/2018 was reviewed, shows question of some underlying chronic lung disease with bilateral lower lobe atelectasis versus scar  Review of Systems  Constitutional: Negative for fever and unexpected weight change.  HENT: Negative for congestion, dental problem, ear pain, nosebleeds, postnasal drip, rhinorrhea, sinus pressure, sneezing, sore throat and trouble swallowing.   Eyes: Negative for redness and itching.  Respiratory: Positive for cough and shortness of breath. Negative for chest tightness and wheezing.   Cardiovascular: Negative for palpitations and leg swelling.  Gastrointestinal: Negative for nausea and vomiting.  Genitourinary: Negative for dysuria.  Musculoskeletal: Negative for joint swelling.  Skin: Negative for rash.  Neurological: Negative for headaches.  Hematological: Does not bruise/bleed easily.  Psychiatric/Behavioral: Negative for dysphoric mood. The  patient is not nervous/anxious.    Past Medical History:  Diagnosis Date  . Abdominal pain   . Anxiety   . Arthritis    OA LOWER SPINE  . Depression    MOSTLY IN THE FALL OF THE YEAR  . 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 Fatigue   . Gastritis    Distal gastritis  . GERD (gastroesophageal reflux disease)    "told that she does"  . Headache(784.0)    HX OF MIGRAINES  . Hiatal hernia 09/2009  . History of kidney stones   . History of rib fracture (Left 4th and 8th rib) 06/28/2015  . Hypertension   . IBS (irritable bowel syndrome)   . Nausea & vomiting   . OSTEOARTHRITIS 05/01/2010   Qualifier: Diagnosis of  By: CMegan SalonMD, John    . Pneumonia    WHEN PT IN 6 TH GRADE  . SBO (small bowel obstruction) ([facility]  . Varicose veins      Family History  Problem Relation Age of Onset  . Hypertension Mother   . Alzheimer's disease Father      Social History   Socioeconomic History  . Marital status: Married    Spouse name: Not on file  . Number of children: Not on file  . Years of education: Not on file  . Highest education level: Not on file  Occupational History  . Not on file  Social Needs  . Financial resource strain: Not on file  . Food insecurity:    Worry: Not on file    Inability: Not on file  . Transportation needs:    Medical: Not on file    Non-medical: Not on  file  Tobacco Use  . Smoking status: Never Smoker  . Smokeless tobacco: Never Used  Substance and Sexual Activity  . Alcohol use: No    Alcohol/week: 0.0 standard drinks  . Drug use: No  . Sexual activity: Not on file  Lifestyle  . Physical activity:    Days per week: Not on file    Minutes per session: Not on file  . Stress: Not on file  Relationships  . Social connections:    Talks on phone: Not on file    Gets together: Not on file    Attends religious service: Not on file    Active member of club or organization: Not on file    Attends meetings of  clubs or organizations: Not on file    Relationship status: Not on file  . Intimate partner violence:    Fear of current or ex partner: Not on file    Emotionally abused: Not on file    Physically abused: Not on file    Forced sexual activity: Not on file  Other Topics Concern  . Not on file  Social History Narrative  . Not on file     Allergies  Allergen Reactions  . Iohexol Anaphylaxis, Hives, Shortness Of Breath and Swelling    (IOHEXOL = Omnipaque) (Radiological Contrast) Code: HIVES, Desc: Pt has had a prior IVP dye reaction,(no scans in health system).Symptoms were hives,and airway obstruction!, Onset Date: 09/29/1999   . Ace Inhibitors     REACTION: cyclic cough  . Losartan Potassium Other (See Comments)    Raises blood pressure; fatigue  . Neosporin [Neomycin-Bacitracin Zn-Polymyx] Dermatitis     Outpatient Medications Prior to Visit  Medication Sig Dispense Refill  . busPIRone (BUSPAR) 15 MG tablet TAKE 1/3 TO 1/2 TAB AS NEEDED FOR ANXIETY UP TO TWICE A DAY  3  . Cholecalciferol (VITAMIN D3) LIQD Take 3 drops daily by mouth.    . conjugated estrogens (PREMARIN) vaginal cream Premarin 0.625 mg/gram vaginal cream  INSERT 1GRAM VAGINALLY 3 TIMES WEEKLY AT BEDTIME    . gabapentin (NEURONTIN) 300 MG capsule Take 2 capsules (600 mg total) by mouth daily. Taking 2 in the morning 60 capsule 0  . irbesartan (AVAPRO) 75 MG tablet Take 75 mg by mouth daily.    . Magnesium 500 MG TABS Take 1 tablet by mouth 2 (two) times daily.    . montelukast (SINGULAIR) 10 MG tablet Take 10 mg by mouth at bedtime.    Marland Kitchen morphine (MS CONTIN) 30 MG 12 hr tablet Take 1 tablet (30 mg total) by mouth every 12 (twelve) hours. 60 tablet 0  . naloxone (NARCAN) nasal spray 4 mg/0.1 mL Spray into one nostril. Repeat with second device into other nostril after 2-3 minutes if no or minimal response. Use in case of opioid overdose. 1 kit 0  . omeprazole (PRILOSEC) 20 MG capsule Take 20 mg by mouth 2 (two)  times daily before a meal.    . PRENATAL VIT-FE FUM-FA-OMEGA PO Take 1 tablet by mouth daily.    . Probiotic Product (ALIGN) 4 MG CAPS Take 1 capsule by mouth at bedtime. Per Pt.    . promethazine-dextromethorphan (PROMETHAZINE-DM) 6.25-15 MG/5ML syrup Take 5 mLs by mouth as needed.  0  . vitamin B-12 (CYANOCOBALAMIN) 500 MCG tablet Take 500 mcg by mouth 3 (three) times a week.    . Wheat Dextrin (BENEFIBER) POWD Stir 2 teaspoons of Benefiber into 4-8 oz of any non-carbonated beverage or soft food (  hot or cold) TID. 500 g PRN  . denosumab (PROLIA) 60 MG/ML SOSY injection Inject 60 mg into the skin every 6 (six) months.    . morphine (MS CONTIN) 30 MG 12 hr tablet Take 1 tablet (30 mg total) by mouth every 12 (twelve) hours. 60 tablet 0  . morphine (MS CONTIN) 30 MG 12 hr tablet Take 1 tablet (30 mg total) by mouth every 12 (twelve) hours. 60 tablet 0  . pregabalin (LYRICA) 25 MG capsule Take 1 capsule (25 mg total) by mouth 2 (two) times daily. 60 capsule 2   No facility-administered medications prior to visit.         Objective:   Physical Exam Vitals:   06/09/18 1345  BP: (!) 142/84  Pulse: 97  SpO2: 97%  Weight: 229 lb (103.9 kg)  Height: 5' 6" (1.676 m)   Gen: Pleasant, well-nourished, in no distress,  normal affect  ENT: No lesions,  mouth clear,  oropharynx clear, no postnasal drip  Neck: No JVD, no TMG, no stridor  Lungs: No use of accessory muscles, no crackles or wheezes.   Cardiovascular: RRR, heart sounds normal, no murmur or gallops, no peripheral edema  Musculoskeletal: No deformities, no cyanosis or clubbing  Neuro: alert, non focal  Skin: Warm, no lesions or rash      Assessment & Plan:  Abnormal CXR Chest x-ray showed some bibasilar atelectatic change and scar, performed during her work-up for chronic cough.  I reviewed the chest x-ray and also previous abdominal and chest CT scans.  There was some linear atelectasis present on the prior scans.  I think  it would be reasonable to perform a repeat CT scan of the chest to directly compare, and ensure no interval change, no developing groundglass or progressive inflammation.  I suspect that the findings are chronic.  No evidence to support an active clinical pneumonia, and she has been treated with antibiotics at least twice during this syndrome.  I will ask her to stop the levofloxacin  Chronic cough Appears to be multifactorial but with a significant contribution from GERD, LPR.  She has a history of gastric bypass and multiple gastric/abdominal surgeries.  She experiences dysphasia, episodic regurgitation.  We will try to treat GERD more aggressively, supplement her rhinitis regimen  Please stop omeprazole temporarily. Please start pantoprazole 40 mg twice a day.  Take this medication 1 hour before eating. Please continue Singulair 10 mg each evening. Please continue your nasal steroid spray, 2 sprays each nostril once daily. Start loratadine 10 mg (generic Claritin) once daily until next visit. Try doing nasal saline rinses once daily to clear out your nasal cavities and sinuses Continue to use Promethazine DM as needed for cough suppression. Start Tessalon Perles 200 mg up to every 6 hours as needed for cough suppression. You will need to work on voice rest, avoiding throat clearing or extra coughing. Depending on how your cough is doing we will decide whether you need any other testing such as breathing tests or airway evaluation.  Baltazar Apo, MD, PhD 06/09/2018, 5:49 PM [facility] [phone] or if no answer [phone]

## 2018-06-16 ENCOUNTER — Encounter (HOSPITAL_COMMUNITY): Payer: Self-pay

## 2018-06-17 ENCOUNTER — Ambulatory Visit: Payer: Medicare Other | Attending: Nurse Practitioner | Admitting: Nurse Practitioner

## 2018-06-17 ENCOUNTER — Encounter: Payer: Self-pay | Admitting: Nurse Practitioner

## 2018-06-17 VITALS — BP 132/74 | HR 77 | Temp 98.0°F | Resp 16 | Ht 66.0 in | Wt 220.0 lb

## 2018-06-17 DIAGNOSIS — M62838 Other muscle spasm: Secondary | ICD-10-CM | POA: Diagnosis not present

## 2018-06-17 DIAGNOSIS — M25561 Pain in right knee: Secondary | ICD-10-CM | POA: Insufficient documentation

## 2018-06-17 DIAGNOSIS — Z9049 Acquired absence of other specified parts of digestive tract: Secondary | ICD-10-CM | POA: Diagnosis not present

## 2018-06-17 DIAGNOSIS — F329 Major depressive disorder, single episode, unspecified: Secondary | ICD-10-CM | POA: Diagnosis not present

## 2018-06-17 DIAGNOSIS — K219 Gastro-esophageal reflux disease without esophagitis: Secondary | ICD-10-CM | POA: Diagnosis not present

## 2018-06-17 DIAGNOSIS — M25562 Pain in left knee: Secondary | ICD-10-CM | POA: Insufficient documentation

## 2018-06-17 DIAGNOSIS — R1012 Left upper quadrant pain: Secondary | ICD-10-CM | POA: Diagnosis not present

## 2018-06-17 DIAGNOSIS — Z6835 Body mass index (BMI) 35.0-35.9, adult: Secondary | ICD-10-CM | POA: Insufficient documentation

## 2018-06-17 DIAGNOSIS — M47892 Other spondylosis, cervical region: Secondary | ICD-10-CM | POA: Insufficient documentation

## 2018-06-17 DIAGNOSIS — M792 Neuralgia and neuritis, unspecified: Secondary | ICD-10-CM | POA: Diagnosis not present

## 2018-06-17 DIAGNOSIS — M533 Sacrococcygeal disorders, not elsewhere classified: Secondary | ICD-10-CM | POA: Insufficient documentation

## 2018-06-17 DIAGNOSIS — F419 Anxiety disorder, unspecified: Secondary | ICD-10-CM | POA: Insufficient documentation

## 2018-06-17 DIAGNOSIS — I83893 Varicose veins of bilateral lower extremities with other complications: Secondary | ICD-10-CM | POA: Diagnosis not present

## 2018-06-17 DIAGNOSIS — G894 Chronic pain syndrome: Secondary | ICD-10-CM

## 2018-06-17 DIAGNOSIS — Z5181 Encounter for therapeutic drug level monitoring: Secondary | ICD-10-CM | POA: Diagnosis not present

## 2018-06-17 DIAGNOSIS — K589 Irritable bowel syndrome without diarrhea: Secondary | ICD-10-CM | POA: Diagnosis not present

## 2018-06-17 DIAGNOSIS — Z79891 Long term (current) use of opiate analgesic: Secondary | ICD-10-CM | POA: Diagnosis not present

## 2018-06-17 DIAGNOSIS — M9904 Segmental and somatic dysfunction of sacral region: Secondary | ICD-10-CM | POA: Diagnosis not present

## 2018-06-17 DIAGNOSIS — M79601 Pain in right arm: Secondary | ICD-10-CM | POA: Diagnosis not present

## 2018-06-17 DIAGNOSIS — Z980 Intestinal bypass and anastomosis status: Secondary | ICD-10-CM | POA: Diagnosis not present

## 2018-06-17 DIAGNOSIS — G629 Polyneuropathy, unspecified: Secondary | ICD-10-CM | POA: Insufficient documentation

## 2018-06-17 DIAGNOSIS — M5388 Other specified dorsopathies, sacral and sacrococcygeal region: Secondary | ICD-10-CM | POA: Diagnosis not present

## 2018-06-17 DIAGNOSIS — G8929 Other chronic pain: Secondary | ICD-10-CM

## 2018-06-17 DIAGNOSIS — T402X5A Adverse effect of other opioids, initial encounter: Secondary | ICD-10-CM

## 2018-06-17 DIAGNOSIS — M47812 Spondylosis without myelopathy or radiculopathy, cervical region: Secondary | ICD-10-CM

## 2018-06-17 DIAGNOSIS — M5414 Radiculopathy, thoracic region: Secondary | ICD-10-CM

## 2018-06-17 DIAGNOSIS — J309 Allergic rhinitis, unspecified: Secondary | ICD-10-CM | POA: Insufficient documentation

## 2018-06-17 DIAGNOSIS — K5903 Drug induced constipation: Secondary | ICD-10-CM | POA: Diagnosis not present

## 2018-06-17 DIAGNOSIS — I10 Essential (primary) hypertension: Secondary | ICD-10-CM | POA: Insufficient documentation

## 2018-06-17 DIAGNOSIS — Z888 Allergy status to other drugs, medicaments and biological substances status: Secondary | ICD-10-CM | POA: Insufficient documentation

## 2018-06-17 DIAGNOSIS — R1011 Right upper quadrant pain: Secondary | ICD-10-CM

## 2018-06-17 DIAGNOSIS — Z79899 Other long term (current) drug therapy: Secondary | ICD-10-CM | POA: Insufficient documentation

## 2018-06-17 MED ORDER — MORPHINE SULFATE ER 30 MG PO TBCR: 30.0000 mg | EXTENDED_RELEASE_TABLET | Freq: Two times a day (BID) | ORAL | 0 refills | Status: DC

## 2018-06-17 MED ORDER — GABAPENTIN 300 MG PO CAPS: 600.0000 mg | ORAL_CAPSULE | Freq: Every day | ORAL | 0 refills | Status: DC

## 2018-06-17 MED ORDER — BENEFIBER PO POWD: ORAL | 99 refills | Status: DC

## 2018-06-17 NOTE — Patient Instructions (Addendum)
____________________________________________________________________________________________  Medication Rules  Applies to: All patients receiving prescriptions (written or electronic).  Pharmacy of record: Pharmacy where electronic prescriptions will be sent. If written prescriptions are taken to a different pharmacy, please inform the nursing staff. The pharmacy listed in the electronic medical record should be the one where you would like electronic prescriptions to be sent.  Prescription refills: Only during scheduled appointments. Applies to both, written and electronic prescriptions.  NOTE: The following applies primarily to controlled substances (Opioid* Pain Medications).   Patient's responsibilities: 1. Pain Pills: Bring all pain pills to every appointment (except for procedure appointments). 2. Pill Bottles: Bring pills in original pharmacy bottle. Always bring newest bottle. Bring bottle, even if empty. 3. Medication refills: You are responsible for knowing and keeping track of what medications you need refilled. The day before your appointment, write a list of all prescriptions that need to be refilled. Bring that list to your appointment and give it to the admitting nurse. Prescriptions will be written only during appointments. If you forget a medication, it will not be "Called in", "Faxed", or "electronically sent". You will need to get another appointment to get these prescribed. 4. Prescription Accuracy: You are responsible for carefully inspecting your prescriptions before leaving our office. Have the discharge nurse carefully go over each prescription with you, before taking them home. Make sure that your name is accurately spelled, that your address is correct. Check the name and dose of your medication to make sure it is accurate. Check the number of pills, and the written instructions to make sure they are clear and accurate. Make sure that you are given enough medication to last  until your next medication refill appointment. 5. Taking Medication: Take medication as prescribed. Never take more pills than instructed. Never take medication more frequently than prescribed. Taking less pills or less frequently is permitted and encouraged, when it comes to controlled substances (written prescriptions).  6. Inform other Doctors: Always inform, all of your healthcare providers, of all the medications you take. 7. Pain Medication from other Providers: You are not allowed to accept any additional pain medication from any other Doctor or Healthcare provider. There are two exceptions to this rule. (see below) In the event that you require additional pain medication, you are responsible for notifying us, as stated below. 8. Medication Agreement: You are responsible for carefully reading and following our Medication Agreement. This must be signed before receiving any prescriptions from our practice. Safely store a copy of your signed Agreement. Violations to the Agreement will result in no further prescriptions. (Additional copies of our Medication Agreement are available upon request.) 9. Laws, Rules, & Regulations: All patients are expected to follow all Federal and State Laws, Statutes, Rules, & Regulations. Ignorance of the Laws does not constitute a valid excuse. The use of any illegal substances is prohibited. 10. Adopted CDC guidelines & recommendations: Target dosing levels will be at or below 60 MME/day. Use of benzodiazepines** is not recommended.  Exceptions: There are only two exceptions to the rule of not receiving pain medications from other Healthcare Providers. 1. Exception #1 (Emergencies): In the event of an emergency (i.e.: accident requiring emergency care), you are allowed to receive additional pain medication. However, you are responsible for: As soon as you are able, call our office [phone], at any time of the day or night, and leave a message stating your name, the  date and nature of the emergency, and the name and dose of the medication   prescribed. In the event that your call is answered by a member of our staff, make sure to document and save the date, time, and the name of the person that took your information.  2. Exception #2 (Planned Surgery): In the event that you are scheduled by another doctor or dentist to have any type of surgery or procedure, you are allowed (for a period no longer than 30 days), to receive additional pain medication, for the acute post-op pain. However, in this case, you are responsible for picking up a copy of our "Post-op Pain Management for Surgeons" handout, and giving it to your surgeon or dentist. This document is available at our office, and does not require an appointment to obtain it. Simply go to our office during business hours (Monday-Thursday from 8:00 AM to 4:00 PM) (Friday 8:00 AM to 12:00 Noon) or if you have a scheduled appointment with [place], prior to your surgery, and ask for it by name. In addition, you will need to provide [place] with your name, name of your surgeon, type of surgery, and date of procedure or surgery.  *Opioid medications include: morphine, codeine, oxycodone, oxymorphone, hydrocodone, hydromorphone, meperidine, tramadol, tapentadol, buprenorphine, fentanyl, methadone. **Benzodiazepine medications include: diazepam (Valium), alprazolam (Xanax), clonazepam (Klonopine), lorazepam (Ativan), clorazepate (Tranxene), chlordiazepoxide (Librium), estazolam (Prosom), oxazepam (Serax), temazepam (Restoril), triazolam (Halcion) (Last updated: 10/09/2017) ____________________________________________________________________________________________   ____________________________________________________________________________________________  Drug Holidays (Slow)  What is a "Drug Holiday"? Drug Holiday: is the name given to the period of time during which a patient stops taking a medication(s) for the purpose of  eliminating tolerance to the drug.  Benefits . Improved effectiveness of opioids. . Decreased opioid dose needed to achieve benefits. . Improved pain with lesser dose.  What is tolerance? Tolerance: is the progressive decreased in effectiveness of a drug due to its repetitive use. With repetitive use, the body gets use to the medication and as a consequence, it loses its effectiveness. This is a common problem seen with opioid pain medications. As a result, a larger dose of the drug is needed to achieve the same effect that used to be obtained with a smaller dose.  How long should a "Drug Holiday" last? At least 14 consecutive days. (2 weeks)  What are withdrawals? Withdrawals: refers to the wide range of symptoms that occur after stopping or dramatically reducing opiate drugs after heavy and prolonged use. Withdrawal symptoms do not occur to patients that use low dose opioids, or those who take the medication sporadically. Contrary to benzodiazepine (example: Valium, Xanax, etc.) or alcohol withdrawals ("Delirium Tremens"), opioid withdrawals are not lethal. Withdrawals are the physical manifestation of the body getting rid of the excess receptors.  Expected Symptoms Early symptoms of withdrawal may include: . Agitation . Anxiety . Muscle aches . Increased tearing . Insomnia . Runny nose . Sweating . Yawning  Late symptoms of withdrawal may include: . Abdominal cramping . Diarrhea . Dilated pupils . Goose bumps . Nausea . Vomiting  Will I experience withdrawals? Due to the slow nature of the taper, it is very unlikely that you will experience any.  What is a slow taper? Taper: refers to the gradual decrease in dose. ___________________________________________________________________________________________  BMI Assessment: Estimated body mass index is 35.51 kg/m as calculated from the following:   Height as of this encounter: 5\' 6"  (1.676 m).   Weight as of this  encounter: 220 lb (99.8 kg).  BMI interpretation table: BMI level Category Range association with higher incidence of chronic pain  <18  kg/m2 Underweight   18.5-24.9 kg/m2 Ideal body weight   25-29.9 kg/m2 Overweight Increased incidence by 20%  30-34.9 kg/m2 Obese (Class I) Increased incidence by 68%  35-39.9 kg/m2 Severe obesity (Class II) Increased incidence by 136%  >40 kg/m2 Extreme obesity (Class III) Increased incidence by 254%   Patient's current BMI Ideal Body weight  Body mass index is 35.51 kg/m. Ideal body weight: 59.3 kg (130 lb 11.7 oz) Adjusted ideal body weight: 75.5 kg (166 lb 7 oz)   BMI Readings from Last 4 Encounters:  06/17/18 35.51 kg/m  06/09/18 36.96 kg/m  04/22/18 35.51 kg/m  04/02/18 35.51 kg/m   Wt Readings from Last 4 Encounters:  06/17/18 220 lb (99.8 kg)  06/09/18 229 lb (103.9 kg)  04/22/18 220 lb (99.8 kg)  04/02/18 220 lb (99.8 kg)    MS Contin 30mg  x 3 months escribed to [facility] in [place] to begin filling on 06/24/18  Gabapentin and Benefiber escribed to [facility].

## 2018-06-17 NOTE — Progress Notes (Signed)
Nursing Pain Medication Assessment:  Safety precautions to be maintained throughout the outpatient stay will include: orient to surroundings, keep bed in low position, maintain call bell within reach at all times, provide assistance with transfer out of bed and ambulation.  Medication Inspection Compliance: Pill count conducted under aseptic conditions, in front of the patient. Neither the pills nor the bottle was removed from the patient's sight at any time. Once count was completed pills were immediately returned to the patient in their original bottle.  Medication: Morphine ER (MSContin) Pill/Patch Count: 13 of 60 pills remain Pill/Patch Appearance: Markings consistent with prescribed medication Bottle Appearance: Standard pharmacy container. Clearly labeled. Filled Date: 10 / 14 / 2019 Last Medication intake:  Today

## 2018-06-17 NOTE — Progress Notes (Addendum)
Patient's Name: Kari Gibbs  MRN: [id number]  Referring Provider: Cari Caraway, MD  DOB: Apr 02, 1951  PCP: Cari Caraway, MD  DOS: 06/17/2018  Note by: Vevelyn Francois NP  Service setting: Ambulatory outpatient  Specialty: Interventional Pain Management  Location: [facility] (AMB) Pain Management Facility    Patient type: Established    Primary Reason(s) for Visit: Encounter for prescription drug management. (Level of risk: moderate)  CC: Abdominal Pain (LUQ); Knee Pain (bilateral); and Arm Pain (right )  HPI  Kari Gibbs is a 67 y.o. year old, female patient, who comes today for a medication management evaluation. 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; S/P laparoscopic cholecystectomy Oct 2014; Fracture of rib (4th and 8th rib) (Left); Long term current use of opiate analgesic; Long term prescription opiate use; Opiate use (60 MME/day); Encounter for therapeutic drug level monitoring; Opiate dependence ([place]); Encounter for chronic pain management; Chronic pain syndrome; Chronic flank pain (Primary Source of Pain) (Left); Chronic thoracic radicular pain (Left-sided); Opioid-induced constipation (OIC); Chronic occipital neuralgia (Left); Neuropathic pain; Neurogenic pain; History of rib fracture (4th and 8th rib) (Left); Vertebral body hemangioma (T11); Metatarsalgia of foot (Right); Chronic abdominal pain (Bilateral) (L>R) (possible left-sided thoracic radiculopathy); Osteoarthritis; Return to work evaluation (FCE: Medium); Varicose veins of bilateral lower extremities with other complications; Varicose veins of left lower extremity with complications; Chronic neck pain (Bilateral) (L>R); Cervical facet syndrome (Bilateral) (L>R); Muscle spasticity; Tremors of nervous system; Muscle twitching; Cervical spondylosis; Chronic low back pain (Bilateral) w/o sciatica; Acute costochondritis  (Left); Morbid obesity ([place]); Chronic low back pain (Bilateral) w/ sciatica (Bilateral); Chronic sacroiliac joint pain (Bilateral); History Allergy to Contrast; Other specified dorsopathies, sacral and sacrococcygeal region; Chronic sacroiliac joint somatic dysfunction (Bilateral); Abnormal CXR; and Chronic cough on their problem list. Her primarily concern today is the Abdominal Pain (LUQ); Knee Pain (bilateral); and Arm Pain (right )  Pain Assessment: Location: Left Abdomen(see visit info for all pain sites. ) Radiating: abdominal pain goes around the back.  entire right arm, muscle pain?  s/p rotator cuff repair.  Onset: More than a month ago Duration: Chronic pain Quality: Discomfort, Constant, Aching, Heaviness, Tiring Severity: 5 /10 (subjective, self-reported pain score)  Note: Reported level is compatible with observation.                          Effect on ADL: knees are getting worse.   Timing: Constant Modifying factors: heat and rest.  medicines BP: 132/74  HR: 77  Kari Gibbs was last scheduled for an appointment on 06/08/2018 for medication management. During today's appointment we reviewed Kari Gibbs's chronic pain status, as well as her outpatient medication regimen.  She admits that she discontinue the use of the Lyrica.  She continues to try to wean off of the gabapentin.  She is considering a drug holiday with her morphine.  The patient  reports that she does not use drugs. Her body mass index is 35.51 kg/m.  Further details on both, my assessment(s), as well as the proposed treatment plan, please see below.  Controlled Substance Pharmacotherapy Assessment REMS (Risk Evaluation and Mitigation Strategy)  Analgesic:MS Contin 30 mg every 12 hours (60 mg/day) MME/day:60 mg/day  Kari Billow, RN  06/17/2018  2:27 PM  Signed Nursing Pain Medication Assessment:  Safety precautions to be maintained throughout the outpatient stay will include: orient to  surroundings,  keep bed in low position, maintain call bell within reach at all times, provide assistance with transfer out of bed and ambulation.  Medication Inspection Compliance: Pill count conducted under aseptic conditions, in front of the patient. Neither the pills nor the bottle was removed from the patient's sight at any time. Once count was completed pills were immediately returned to the patient in their original bottle.  Medication: Morphine ER (MSContin) Pill/Patch Count: 13 of 60 pills remain Pill/Patch Appearance: Markings consistent with prescribed medication Bottle Appearance: Standard pharmacy container. Clearly labeled. Filled Date: 10 / 14 / 2019 Last Medication intake:  Today   Pharmacokinetics: Liberation and absorption (onset of action): WNL Distribution (time to peak effect): WNL Metabolism and excretion (duration of action): WNL         Pharmacodynamics: Desired effects: Analgesia: Kari Gibbs reports >50% benefit. Functional ability: Patient reports that medication allows her to accomplish basic ADLs Clinically meaningful improvement in function (CMIF): Sustained CMIF goals met Perceived effectiveness: Described as relatively effective, allowing for increase in activities of daily living (ADL) Undesirable effects: Side-effects or Adverse reactions: None reported Monitoring: [place] PMP: Online review of the past 76-monthperiod conducted. Compliant with practice rules and regulations Last UDS on record: Summary  Date Value Ref Range Status  03/25/2018 FINAL  Final    Comment:    ==================================================================== TOXASSURE SELECT 13 (MW) ==================================================================== Test                             Result       Flag       Units Drug Present and Declared for Prescription Verification   Morphine                       2254         EXPECTED   ng/mg creat   Normorphine                    237           EXPECTED   ng/mg creat    Potential sources of large amounts of morphine in the absence of    codeine include administration of morphine or use of heroin.    Normorphine is an expected metabolite of morphine.   Hydromorphone                  60           EXPECTED   ng/mg creat    Hydromorphone may be present as a metabolite of morphine;    concentrations of hydromorphone rarely exceed 5% of the morphine    concentration when this is the source of hydromorphone. ==================================================================== Test                      Result    Flag   Units      Ref Range   Creatinine              179              mg/dL      >=20 ==================================================================== Declared Medications:  The flagging and interpretation on this report are based on the  following declared medications.  Unexpected results may arise from  inaccuracies in the declared medications.  **Note: The testing scope of this panel includes these medications:  Morphine (MS Contin)  **Note: The testing scope of this panel  does not include following  reported medications:  Buspirone  Conjugated Estrogens  Cyanocobalamin  Denosumab  Gabapentin  Magnesium  Montelukast  Multivitamin (Prenatal Vitamin)  Naloxone  Omeprazole  Pregabalin  Supplement  Supplement (Probiotic)  Vitamin D3 ==================================================================== For clinical consultation, please call [phone]. ====================================================================    UDS interpretation: Compliant          Medication Assessment Form: Reviewed. Patient indicates being compliant with therapy Treatment compliance: Compliant Risk Assessment Profile: Aberrant behavior: See prior evaluations. None observed or detected today Comorbid factors increasing risk of overdose: See prior notes. No additional risks detected today Opioid risk tool (ORT) (Total  Score): 3 Personal History of Substance Abuse (SUD-Substance use disorder):  Alcohol: Negative  Illegal Drugs: Negative  Rx Drugs: Negative  ORT Risk Level calculation: Low Risk Risk of substance use disorder (SUD): Low Opioid Risk Tool - 06/17/18 0953      Family History of Substance Abuse   Alcohol  Negative    Illegal Drugs  Negative    Rx Drugs  Negative      Personal History of Substance Abuse   Alcohol  Negative    Illegal Drugs  Negative    Rx Drugs  Negative      Age   Age between 13-45 years   No      Psychological Disease   Psychological Disease  Positive    ADD  Negative    OCD  Negative    Bipolar  Negative    Schizophrenia  Negative    Depression  Positive   taking buspar for anxiety/ depression and states she uses buspar when needed      Total Score   Opioid Risk Tool Scoring  3    Opioid Risk Interpretation  Low Risk      ORT Scoring interpretation table:  Score <3 = Low Risk for SUD  Score between 4-7 = Moderate Risk for SUD  Score >8 = High Risk for Opioid Abuse   Risk Mitigation Strategies:  Patient Counseling: Covered Patient-Prescriber Agreement (PPA): Present and active  Notification to other healthcare providers: Done  Pharmacologic Plan: No change in therapy, at this time.             Laboratory Chemistry  Inflammation Markers (CRP: Acute Phase) (ESR: Chronic Phase) Lab Results  Component Value Date   CRP <0.5 01/04/2016   ESRSEDRATE 17 01/04/2016                         Rheumatology Markers No results found for: RF, ANA, LABURIC, URICUR, LYMEIGGIGMAB, LYMEABIGMQN, HLAB27                      Renal Function Markers Lab Results  Component Value Date   BUN 20 01/04/2016   CREATININE 0.76 01/04/2016   GFRAA >60 01/04/2016   GFRNONAA >60 01/04/2016                             Hepatic Function Markers Lab Results  Component Value Date   AST 33 01/04/2016   ALT 26 01/04/2016   ALBUMIN 4.2 01/04/2016   ALKPHOS 97 01/04/2016    AMYLASE 30 02/27/2010   LIPASE 22 10/12/2012                        Electrolytes Lab Results  Component Value Date   NA 139  01/04/2016   K 4.6 01/04/2016   CL 99 (L) 01/04/2016   CALCIUM 9.7 01/04/2016   MG 2.1 01/04/2016                        Neuropathy Markers Lab Results  Component Value Date   VITAMINB12 321 01/04/2016                        CNS Tests No results found for: COLORCSF, APPEARCSF, RBCCOUNTCSF, WBCCSF, POLYSCSF, LYMPHSCSF, EOSCSF, PROTEINCSF, GLUCCSF, JCVIRUS, CSFOLI, IGGCSF                      Bone Pathology Markers Lab Results  Component Value Date   25OHVITD1 49 01/04/2016   25OHVITD2 25 01/04/2016   25OHVITD3 24 01/04/2016                         Coagulation Parameters Lab Results  Component Value Date   PLT 238 11/18/2014   DDIMER  02/06/2010    <0.22        AT THE INHOUSE ESTABLISHED CUTOFF VALUE OF 0.48 ug/mL FEU, THIS ASSAY HAS BEEN DOCUMENTED IN THE LITERATURE TO HAVE A SENSITIVITY AND NEGATIVE PREDICTIVE VALUE OF AT LEAST 98 TO 99%.  THE TEST RESULT SHOULD BE CORRELATED WITH AN ASSESSMENT OF THE CLINICAL PROBABILITY OF DVT / VTE.                        Cardiovascular Markers Lab Results  Component Value Date   CKTOTAL 39 02/06/2010   CKMB 0.4 02/06/2010   TROPONINI 0.01        NO INDICATION OF MYOCARDIAL INJURY. 02/06/2010   HGB 11.1 (L) 11/18/2014   HCT 34.1 (L) 11/18/2014                         CA Markers No results found for: CEA, CA125, LABCA2                      Note: Lab results reviewed.  Recent Diagnostic Imaging Results  DG C-Arm 1-60 Min-No Report Fluoroscopy was utilized by the requesting physician.  No radiographic  interpretation.   Complexity Note: Imaging results reviewed. Results shared with Ms. Izora Ribas, using Layman's terms.                         Meds   Current Outpatient Medications:  .  azelastine (ASTELIN) 0.1 % nasal spray, Place 1 spray into both nostrils daily., Disp: , Rfl:  .   benzonatate (TESSALON) 200 MG capsule, Take 1 capsule (200 mg total) by mouth every 6 (six) hours as needed for cough., Disp: 90 capsule, Rfl: 1 .  busPIRone (BUSPAR) 15 MG tablet, TAKE 1/3 TO 1/2 TAB AS NEEDED FOR ANXIETY UP TO TWICE A DAY, Disp: , Rfl: 3 .  Cholecalciferol (VITAMIN D3) LIQD, Take 3 drops by mouth daily. , Disp: , Rfl:  .  conjugated estrogens (PREMARIN) vaginal cream, Premarin 0.625 mg/gram vaginal cream  INSERT 1GRAM VAGINALLY 3 TIMES WEEKLY AT BEDTIME, Disp: , Rfl:  .  diclofenac sodium (VOLTAREN) 1 % GEL, Apply 1 application topically as needed., Disp: , Rfl:  .  gabapentin (NEURONTIN) 300 MG capsule, Take 2 capsules (600 mg total) by mouth daily. Taking 2 in the morning, Disp: 60  capsule, Rfl: 0 .  irbesartan (AVAPRO) 75 MG tablet, Take 75 mg by mouth daily., Disp: , Rfl:  .  Magnesium 500 MG TABS, Take 1 tablet by mouth 2 (two) times daily., Disp: , Rfl:  .  montelukast (SINGULAIR) 10 MG tablet, Take 10 mg by mouth at bedtime., Disp: , Rfl:  .  [START ON 08/23/2018] morphine (MS CONTIN) 30 MG 12 hr tablet, Take 1 tablet (30 mg total) by mouth every 12 (twelve) hours., Disp: 60 tablet, Rfl: 0 .  naloxone (NARCAN) nasal spray 4 mg/0.1 mL, Spray into one nostril. Repeat with second device into other nostril after 2-3 minutes if no or minimal response. Use in case of opioid overdose., Disp: 1 kit, Rfl: 0 .  pantoprazole (PROTONIX) 40 MG tablet, Take 1 tablet (40 mg total) by mouth daily., Disp: 30 tablet, Rfl: 5 .  PRENATAL VIT-FE FUM-FA-OMEGA PO, Take 1 tablet by mouth daily., Disp: , Rfl:  .  Probiotic Product (ALIGN) 4 MG CAPS, Take 1 capsule by mouth at bedtime. Per Pt., Disp: , Rfl:  .  promethazine-dextromethorphan (PROMETHAZINE-DM) 6.25-15 MG/5ML syrup, Take 5 mLs by mouth as needed., Disp: , Rfl: 0 .  vitamin B-12 (CYANOCOBALAMIN) 500 MCG tablet, Take 500 mcg by mouth 3 (three) times a week., Disp: , Rfl:  .  Wheat Dextrin (BENEFIBER) POWD, Stir 2 teaspoons of Benefiber  into 4-8 oz of any non-carbonated beverage or soft food (hot or cold) TID., Disp: 500 g, Rfl: PRN .  [START ON 07/24/2018] morphine (MS CONTIN) 30 MG 12 hr tablet, Take 1 tablet (30 mg total) by mouth every 12 (twelve) hours., Disp: 60 tablet, Rfl: 0 .  [START ON 06/24/2018] morphine (MS CONTIN) 30 MG 12 hr tablet, Take 1 tablet (30 mg total) by mouth every 12 (twelve) hours., Disp: 60 tablet, Rfl: 0  ROS  Constitutional: Denies any fever or chills Gastrointestinal: No reported hemesis, hematochezia, vomiting, or acute GI distress Musculoskeletal: Denies any acute onset joint swelling, redness, loss of ROM, or weakness Neurological: No reported episodes of acute onset apraxia, aphasia, dysarthria, agnosia, amnesia, paralysis, loss of coordination, or loss of consciousness  Allergies  Ms. Apachito is allergic to iohexol; ace inhibitors; losartan potassium; and neosporin [neomycin-bacitracin zn-polymyx].  [place]  Drug: Ms. Panik  reports that she does not use drugs. Alcohol:  reports that she does not drink alcohol. Tobacco:  reports that she has never smoked. She has never used smokeless tobacco. 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 Surgical: Ms. 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 Family: family history includes Alzheimer's disease in her father; Hypertension in her mother.  Constitutional Exam  General appearance: alert, cooperative, morbidly obese and pale Vitals:   06/17/18 0942  BP: 132/74  Pulse: 77  Resp: 16  Temp: 98 F (36.7 C)  TempSrc: Oral  SpO2: 99%  Weight: 220 lb (99.8 kg)  Height: '5\' 6"'$  (1.676 m)  Psych/Mental status: Alert, oriented x 3 (person, place, & time)       Eyes: PERLA Respiratory: No evidence of acute respiratory distress  Cervical Spine Area Exam  Skin & Axial Inspection: No masses, redness, edema, swelling, or associated skin lesions Alignment: Symmetrical Functional ROM: Unrestricted ROM      Stability: No instability detected Muscle Tone/Strength: Functionally intact. No obvious neuro-muscular anomalies detected. Sensory (Neurological): Unimpaired Palpation: No palpable anomalies              Upper Extremity (UE) Exam    Side: Right upper extremity  Side: Left upper extremity  Skin & Extremity Inspection: Skin color, temperature, and hair growth are WNL. No peripheral edema or cyanosis. No masses, redness, swelling, asymmetry, or associated skin lesions. No contractures.  Skin & Extremity Inspection: Skin color, temperature, and hair growth are WNL. No peripheral edema or cyanosis. No masses, redness, swelling, asymmetry, or associated skin lesions. No contractures.  Functional ROM: Unrestricted ROM          Functional ROM: Unrestricted ROM          Muscle Tone/Strength: Functionally intact. No obvious neuro-muscular anomalies detected.  Muscle Tone/Strength: Functionally intact. No obvious neuro-muscular anomalies detected.  Sensory (Neurological): Unimpaired          Sensory (Neurological): Unimpaired          Palpation: No palpable anomalies              Palpation: No palpable anomalies               Thoracic Spine Area Exam  Skin &  Axial Inspection: No masses, redness, or swelling Alignment: Symmetrical Functional ROM: Unrestricted ROM Stability: No instability detected Muscle Tone/Strength: Functionally intact. No obvious neuro-muscular anomalies detected. Sensory (Neurological): Unimpaired Muscle strength & Tone: No palpable anomalies  Gait & Posture Assessment  Ambulation: Unassisted Gait: Relatively normal for age and body habitus Posture: WNL   Lower Extremity Exam    Side: Right lower extremity  Side: Left lower extremity  Stability: No instability observed          Stability: No instability observed          Skin & Extremity Inspection: Skin color, temperature, and hair growth are WNL. No peripheral edema or cyanosis. No masses, redness, swelling, asymmetry, or associated skin lesions. No contractures.  Skin & Extremity Inspection: Skin color, temperature, and hair growth are WNL. No peripheral edema or cyanosis. No masses, redness, swelling, asymmetry, or associated skin lesions. No contractures.  Functional ROM: Unrestricted ROM                  Functional ROM: Unrestricted ROM                  Muscle Tone/Strength: Functionally intact. No obvious neuro-muscular anomalies detected.  Muscle Tone/Strength: Functionally intact. No obvious neuro-muscular anomalies detected.  Sensory (Neurological): Unimpaired  Sensory (Neurological): Unimpaired  Palpation: No palpable anomalies  Palpation: No palpable anomalies   Assessment  Primary Diagnosis & Pertinent Problem List: The primary encounter diagnosis was Cervical spondylosis. Diagnoses of Chronic abdominal pain (Bilateral) (L>R) (possible left-sided thoracic radiculopathy), Chronic thoracic radicular pain (Left-sided), Neurogenic pain, Neuropathic pain, Therapeutic opioid-induced constipation (OIC), Chronic pain syndrome, and Long term current use of opiate analgesic were also pertinent to this visit.  Status Diagnosis  Persistent Persistent Persistent 1.  Cervical spondylosis   2. Chronic abdominal pain (Bilateral) (L>R) (possible left-sided thoracic radiculopathy)   3. Chronic thoracic radicular pain (Left-sided)  4. Neurogenic pain   5. Neuropathic pain   6. Therapeutic opioid-induced constipation (OIC)   7. Chronic pain syndrome   8. Long term current use of opiate analgesic     Problems updated and reviewed during this visit: No problems updated. Plan of Care  Pharmacotherapy (Medications Ordered): Meds ordered this encounter  Medications  . gabapentin (NEURONTIN) 300 MG capsule    Sig: Take 2 capsules (600 mg total) by mouth daily. Taking 2 in the morning    Dispense:  60 capsule    Refill:  0    Do not place this medication, or any other prescription from our practice, on "Automatic Refill". Patient may have prescription filled one day early if pharmacy is closed on scheduled refill date.    Order Specific Question:   Supervising Provider    Answer:   [name] [phone]  . morphine (MS CONTIN) 30 MG 12 hr tablet    Sig: Take 1 tablet (30 mg total) by mouth every 12 (twelve) hours.    Dispense:  60 tablet    Refill:  0    Do not place this medication, or any other prescription from our practice, on "Automatic Refill". Patient may have prescription filled one day early if pharmacy is closed on scheduled refill date.    Order Specific Question:   Supervising Provider    Answer:   [name] [phone]  . morphine (MS CONTIN) 30 MG 12 hr tablet    Sig: Take 1 tablet (30 mg total) by mouth every 12 (twelve) hours.    Dispense:  60 tablet    Refill:  0    Do not place this medication, or any other prescription from our practice, on "Automatic Refill". Patient may have prescription filled one day early if pharmacy is closed on scheduled refill date.    Order Specific Question:   Supervising Provider    Answer:   [name] [phone]  . morphine (MS CONTIN) 30 MG 12 hr tablet    Sig: Take 1 tablet (30 mg  total) by mouth every 12 (twelve) hours.    Dispense:  60 tablet    Refill:  0    Do not place this medication, or any other prescription from our practice, on "Automatic Refill". Patient may have prescription filled one day early if pharmacy is closed on scheduled refill date.    Order Specific Question:   Supervising Provider    Answer:   [name] [phone]  . Wheat Dextrin (BENEFIBER) POWD    Sig: Stir 2 teaspoons of Benefiber into 4-8 oz of any non-carbonated beverage or soft food (hot or cold) TID.    Dispense:  500 g    Refill:  PRN    Do not place this medication, or any other prescription from our practice, on "Automatic Refill". Patient may have prescription filled one day early if pharmacy is closed on scheduled refill date.    Order Specific Question:   Supervising Provider    Answer:   [name] [[id number]]   New Prescriptions   No medications on file   Medications administered today: Analissa Bayless had no medications administered during this visit. Lab-work, procedure(s), and/or referral(s): No orders of the defined types were placed in this encounter.  Imaging and/or referral(s): None  Planned, scheduled, and/or pending: Not at this time. She will continue to reduce the gabapentin slowly and possibly try a drug holiday.   Considering: Left greater occipital nerve RFA  Diagnostic left C2 +  TON nerve block  Possible left C2 + TON   Palliative PRN treatment(s): Left greater occipital nerve RFA    Provider-requested follow-up: Return in about 3 months (around 09/17/2018) for MedMgmt.  Future Appointments  Date Time Provider [place]  06/22/2018  3:30 PM [facility] 1 [facility]  07/13/2018  2:30 PM Collene Gobble, MD [facility] None  09/07/2018  1:30 PM Vevelyn Francois, NP [facility] None   Primary Care Physician: Cari Caraway, MD Location: [facility] Outpatient Pain Management Facility Note by: Vevelyn Francois NP Date:  06/17/2018; Time: 2:40 PM  Pain Score Disclaimer: We use the NRS-11 scale. This is a self-reported, subjective measurement of pain severity with only modest accuracy. It is used primarily to identify changes within a particular patient. It must be understood that outpatient pain scales are significantly less accurate that those used for research, where they can be applied under ideal controlled circumstances with minimal exposure to variables. In reality, the score is likely to be a combination of pain intensity and pain affect, where pain affect describes the degree of emotional arousal or changes in action readiness caused by the sensory experience of pain. Factors such as social and work situation, setting, emotional state, anxiety levels, expectation, and prior pain experience may influence pain perception and show large inter-individual differences that may also be affected by time variables.  Patient instructions provided during this appointment: Patient Instructions   ____________________________________________________________________________________________  Medication Rules  Applies to: All patients receiving prescriptions (written or electronic).  Pharmacy of record: Pharmacy where electronic prescriptions will be sent. If written prescriptions are taken to a different pharmacy, please inform the nursing staff. The pharmacy listed in the electronic medical record should be the one where you would like electronic prescriptions to be sent.  Prescription refills: Only during scheduled appointments. Applies to both, written and electronic prescriptions.  NOTE: The following applies primarily to controlled substances (Opioid* Pain Medications).   Patient's responsibilities: 1. Pain Pills: Bring all pain pills to every appointment (except for procedure appointments). 2. Pill Bottles: Bring pills in original pharmacy bottle. Always bring newest bottle. Bring bottle, even if  empty. 3. Medication refills: You are responsible for knowing and keeping track of what medications you need refilled. The day before your appointment, write a list of all prescriptions that need to be refilled. Bring that list to your appointment and give it to the admitting nurse. Prescriptions will be written only during appointments. If you forget a medication, it will not be "Called in", "Faxed", or "electronically sent". You will need to get another appointment to get these prescribed. 4. Prescription Accuracy: You are responsible for carefully inspecting your prescriptions before leaving our office. Have the discharge nurse carefully go over each prescription with you, before taking them home. Make sure that your name is accurately spelled, that your address is correct. Check the name and dose of your medication to make sure it is accurate. Check the number of pills, and the written instructions to make sure they are clear and accurate. Make sure that you are given enough medication to last until your next medication refill appointment. 5. Taking Medication: Take medication as prescribed. Never take more pills than instructed. Never take medication more frequently than prescribed. Taking less pills or less frequently is permitted and encouraged, when it comes to controlled substances (written prescriptions).  6. Inform other Doctors: Always inform, all of your healthcare providers, of all the medications you take. 7. Pain Medication from other Providers:  You are not allowed to accept any additional pain medication from any other Doctor or Healthcare provider. There are two exceptions to this rule. (see below) In the event that you require additional pain medication, you are responsible for notifying us, as stated below. 8. Medication Agreement: You are responsible for carefully reading and following our Medication Agreement. This must be signed before receiving any prescriptions from our practice.  Safely store a copy of your signed Agreement. Violations to the Agreement will result in no further prescriptions. (Additional copies of our Medication Agreement are available upon request.) 9. Laws, Rules, & Regulations: All patients are expected to follow all Federal and [employer], [employer], Rules, [employer]. Ignorance of the Laws does not constitute a valid excuse. The use of any illegal substances is prohibited. 10. Adopted CDC guidelines & recommendations: Target dosing levels will be at or below 60 MME/day. Use of benzodiazepines** is not recommended.  Exceptions: There are only two exceptions to the rule of not receiving pain medications from other Healthcare Providers. 1. Exception #1 (Emergencies): In the event of an emergency (i.e.: accident requiring emergency care), you are allowed to receive additional pain medication. However, you are responsible for: As soon as you are able, call our office [phone], at any time of the day or night, and leave a message stating your name, the date and nature of the emergency, and the name and dose of the medication prescribed. In the event that your call is answered by a member of our staff, make sure to document and save the date, time, and the name of the person that took your information.  2. Exception #2 (Planned Surgery): In the event that you are scheduled by another doctor or dentist to have any type of surgery or procedure, you are allowed (for a period no longer than 30 days), to receive additional pain medication, for the acute post-op pain. However, in this case, you are responsible for picking up a copy of our "Post-op Pain Management for Surgeons" handout, and giving it to your surgeon or dentist. This document is available at our office, and does not require an appointment to obtain it. Simply go to our office during business hours (Monday-Thursday from 8:00 AM to 4:00 PM) (Friday 8:00 AM to 12:00 Noon) or if you have a scheduled  appointment with [place], prior to your surgery, and ask for it by name. In addition, you will need to provide [place] with your name, name of your surgeon, type of surgery, and date of procedure or surgery.  *Opioid medications include: morphine, codeine, oxycodone, oxymorphone, hydrocodone, hydromorphone, meperidine, tramadol, tapentadol, buprenorphine, fentanyl, methadone. **Benzodiazepine medications include: diazepam (Valium), alprazolam (Xanax), clonazepam (Klonopine), lorazepam (Ativan), clorazepate (Tranxene), chlordiazepoxide (Librium), estazolam (Prosom), oxazepam (Serax), temazepam (Restoril), triazolam (Halcion) (Last updated: 10/09/2017) ____________________________________________________________________________________________   ____________________________________________________________________________________________  Drug Holidays (Slow)  What is a "Drug Holiday"? Drug Holiday: is the name given to the period of time during which a patient stops taking a medication(s) for the purpose of eliminating tolerance to the drug.  Benefits . Improved effectiveness of opioids. . Decreased opioid dose needed to achieve benefits. . Improved pain with lesser dose.  What is tolerance? Tolerance: is the progressive decreased in effectiveness of a drug due to its repetitive use. With repetitive use, the body gets use to the medication and as a consequence, it loses its effectiveness. This is a common problem seen with opioid pain medications. As a result, a larger dose of the drug is needed to  achieve the same effect that used to be obtained with a smaller dose.  How long should a "Drug Holiday" last? At least 14 consecutive days. (2 weeks)  What are withdrawals? Withdrawals: refers to the wide range of symptoms that occur after stopping or dramatically reducing opiate drugs after heavy and prolonged use. Withdrawal symptoms do not occur to patients that use low dose opioids, or those who take  the medication sporadically. Contrary to benzodiazepine (example: Valium, Xanax, etc.) or alcohol withdrawals ("Delirium Tremens"), opioid withdrawals are not lethal. Withdrawals are the physical manifestation of the body getting rid of the excess receptors.  Expected Symptoms Early symptoms of withdrawal may include: . Agitation . Anxiety . Muscle aches . Increased tearing . Insomnia . Runny nose . Sweating . Yawning  Late symptoms of withdrawal may include: . Abdominal cramping . Diarrhea . Dilated pupils . Goose bumps . Nausea . Vomiting  Will I experience withdrawals? Due to the slow nature of the taper, it is very unlikely that you will experience any.  What is a slow taper? Taper: refers to the gradual decrease in dose. ___________________________________________________________________________________________  BMI Assessment: Estimated body mass index is 35.51 kg/m as calculated from the following:   Height as of this encounter: '5\' 6"'$  (1.676 m).   Weight as of this encounter: 220 lb (99.8 kg).  BMI interpretation table: BMI level Category Range association with higher incidence of chronic pain  <18 kg/m2 Underweight   18.5-24.9 kg/m2 Ideal body weight   25-29.9 kg/m2 Overweight Increased incidence by 20%  30-34.9 kg/m2 Obese (Class I) Increased incidence by 68%  35-39.9 kg/m2 Severe obesity (Class II) Increased incidence by 136%  >40 kg/m2 Extreme obesity (Class III) Increased incidence by 254%   Patient's current BMI Ideal Body weight  Body mass index is 35.51 kg/m. Ideal body weight: 59.3 kg (130 lb 11.7 oz) Adjusted ideal body weight: 75.5 kg (166 lb 7 oz)   BMI Readings from Last 4 Encounters:  06/17/18 35.51 kg/m  06/09/18 36.96 kg/m  04/22/18 35.51 kg/m  04/02/18 35.51 kg/m   Wt Readings from Last 4 Encounters:  06/17/18 220 lb (99.8 kg)  06/09/18 229 lb (103.9 kg)  04/22/18 220 lb (99.8 kg)  04/02/18 220 lb (99.8 kg)    MS Contin '30mg'$  x 3  [facility] [place] [facility] on [facility]24/18  Gabapentin and Benefiber escribed to [facility].

## 2018-06-22 ENCOUNTER — Ambulatory Visit (INDEPENDENT_AMBULATORY_CARE_PROVIDER_SITE_OTHER)
Admission: RE | Admit: 2018-06-22 | Discharge: 2018-06-22 | Disposition: A | Discharge status: Home / Self Care | Payer: Medicare Other | Source: Ambulatory Visit | Attending: Emergency Medicine | Admitting: Emergency Medicine

## 2018-06-22 DIAGNOSIS — J849 Interstitial pulmonary disease, unspecified: Secondary | ICD-10-CM

## 2018-06-23 ENCOUNTER — Telehealth: Payer: Self-pay | Admitting: Emergency Medicine

## 2018-06-23 NOTE — Telephone Encounter (Signed)
Called and spoke to patient, requesting refill of protonix with correct scrip that says twice daily. Confirmed pharmacy. Patient also requesting refill of Promethazine Cough Syrup. States her PCP prescribed but RB told her he would take over it.   RB please advise if okay to refill Promethazine-Dextromethorphan cough syrup.   Last Filled 09-11/2017 by PCP.

## 2018-06-24 MED ORDER — PROMETHAZINE-DM 6.25-15 MG/5ML PO SYRP: 5.0000 mL | ORAL_SOLUTION | ORAL | 0 refills | Status: DC | PRN

## 2018-06-24 MED ORDER — PANTOPRAZOLE SODIUM 40 MG PO TBEC: 40.0000 mg | DELAYED_RELEASE_TABLET | Freq: Two times a day (BID) | ORAL | 3 refills | Status: DC

## 2018-06-24 NOTE — Telephone Encounter (Signed)
Yes Ok to prescribe

## 2018-06-24 NOTE — Telephone Encounter (Signed)
Called and spoke with patient she is aware and verbalized understanding. Prescription printed and signed, placed up front. Nothing further needed.

## 2018-06-24 NOTE — Telephone Encounter (Signed)
Patient returned, CB is [phone]

## 2018-07-04 ENCOUNTER — Other Ambulatory Visit: Payer: Self-pay | Admitting: Nurse Practitioner

## 2018-07-04 DIAGNOSIS — M792 Neuralgia and neuritis, unspecified: Secondary | ICD-10-CM

## 2018-07-06 ENCOUNTER — Other Ambulatory Visit: Payer: Self-pay | Admitting: *Deleted

## 2018-07-06 MED ORDER — PANTOPRAZOLE SODIUM 40 MG PO TBEC: 40.0000 mg | DELAYED_RELEASE_TABLET | Freq: Two times a day (BID) | ORAL | 1 refills | Status: DC

## 2018-07-13 ENCOUNTER — Ambulatory Visit (INDEPENDENT_AMBULATORY_CARE_PROVIDER_SITE_OTHER): Payer: Medicare Other | Admitting: Emergency Medicine

## 2018-07-13 ENCOUNTER — Encounter: Payer: Self-pay | Admitting: Emergency Medicine

## 2018-07-13 DIAGNOSIS — R05 Cough: Secondary | ICD-10-CM

## 2018-07-13 DIAGNOSIS — R9389 Abnormal findings on diagnostic imaging of other specified body structures: Secondary | ICD-10-CM | POA: Diagnosis not present

## 2018-07-13 DIAGNOSIS — R053 Chronic cough: Secondary | ICD-10-CM

## 2018-07-13 MED ORDER — PANTOPRAZOLE SODIUM 40 MG PO TBEC: 40.0000 mg | DELAYED_RELEASE_TABLET | Freq: Every day | ORAL | 3 refills | Status: DC

## 2018-07-13 MED ORDER — MONTELUKAST SODIUM 10 MG PO TABS: 10.0000 mg | ORAL_TABLET | Freq: Every day | ORAL | 3 refills | Status: DC

## 2018-07-13 NOTE — Assessment & Plan Note (Signed)
There are a few focal areas of basilar scar without any traction bronchiectasis, overt interstitial lung disease.  This is a reassuring scan.  I do not think any other work-up for ILD or chronic infection is indicated.

## 2018-07-13 NOTE — Assessment & Plan Note (Signed)
Seems to be better with the maneuvers that we made last visit.  I suspect that the increased PPI has been the most helpful.  We will try to decrease the frequency of her pantoprazole, see if she tolerates.  I explained to her how to try to come off the medication systematically so that she can identify the good or bad effects as changes are made.

## 2018-07-13 NOTE — Patient Instructions (Addendum)
Your Ct chest does not show any evidence for interstitial lung disease.  We will plan to decrease your pantoprazole to 40mg  once a day.  Please continue your Singulair, nasal steroid spray and loratadine as you have been taking them.  If your cough remains stable on low-dose pantoprazole then you could consider stopping the loratadine to see if you tolerate Follow with Dr Lamonte Sakai if needed

## 2018-07-13 NOTE — Progress Notes (Signed)
Subjective:    Patient ID: Kari Gibbs, female    DOB: [birth year], 67 y.o.   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 She has chronic abd pain from this. She notes that she was hospitalized as a child for PNA. She has also had intermittent nasal congestion and flares of bronchitis, more bothersome   She is referred today for evaluation of chronic cough that started about a month ago, associated with nasal congestion, barking cough. Treated with abx, didn't take the pred that was prescribed. Was started on nasal steroid, singulair. Most recently she was given levaquin based on the CXR below.   She is on omeprazole 69m bid, but she has breakthrough reflux. She sometimes has dysphagia, but no overt aspiration sx.   [facility] was reviewed.  This showed grossly normal airflows with a normal flow volume loop.  A chest x-ray done on 06/04/2018 was reviewed, shows question of some underlying chronic lung disease with bilateral lower lobe atelectasis versus scar   ROV 07/13/18 --this is a follow-up visit for evaluation of chronic cough.  She has some associated nasal congestion, esophageal reflux.  She also had some basilar interstitial changes on chest x-ray.  We performed a high-resolution CT scan of her chest on 11/14 that I reviewed and which shows some linear scarring and associated bronchiectasis and volume loss in the right middle lobe and lingula, bilateral lower lobes.  There is no overt interstitial lung disease.  At her initial visit I temporarily stop omeprazole and changed her to pantoprazole, continued Singulair, nasal steroid and added loratadine.  We also discussed doing nasal saline rinses. Her cough has been better, she may still have a little reflux present.    Review of Systems  Constitutional:  Negative for fever and unexpected weight change.  HENT: Negative for congestion, dental problem, ear pain, nosebleeds, postnasal drip, rhinorrhea, sinus pressure, sneezing, sore throat and trouble swallowing.   Eyes: Negative for redness and itching.  Respiratory: Positive for cough and shortness of breath. Negative for chest tightness and wheezing.   Cardiovascular: Negative for palpitations and leg swelling.  Gastrointestinal: Negative for nausea and vomiting.  Genitourinary: Negative for dysuria.  Musculoskeletal: Negative for joint swelling.  Skin: Negative for rash.  Neurological: Negative for headaches.  Hematological: Does not bruise/bleed easily.  Psychiatric/Behavioral: Negative for dysphoric mood. The patient is not nervous/anxious.    Past Medical History:  Diagnosis Date  . Abdominal pain   . Anxiety   . Arthritis    OA LOWER SPINE  . Depression    MOSTLY IN THE FALL OF THE YEAR  . 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 Fatigue   . Gastritis    Distal gastritis  . GERD (gastroesophageal reflux disease)    "told that she does"  . Headache(784.0)    HX OF MIGRAINES  . Hiatal hernia 09/2009  . History of kidney stones   . History of rib fracture (Left 4th and 8th rib) 06/28/2015  . Hypertension   . IBS (irritable bowel syndrome)   . Nausea & vomiting   . OSTEOARTHRITIS 05/01/2010   Qualifier: Diagnosis of  By: CMegan SalonMD, John    . Pneumonia    WHEN PT IN 6 TH GRADE  . SBO (small bowel obstruction) ([place] 2013  . Varicose veins  Family History  Problem Relation Age of Onset  . Hypertension Mother   . Alzheimer's disease Father      Social History   Socioeconomic History  . Marital status: Married    Spouse name: Not on file  . Number of children: Not on file  . Years of education: Not on file  . Highest education level: Not on file  Occupational History  . Not on file  Social Needs  . Financial resource strain: Not  on file  . Food insecurity:    Worry: Not on file    Inability: Not on file  . Transportation needs:    Medical: Not on file    Non-medical: Not on file  Tobacco Use  . Smoking status: Never Smoker  . Smokeless tobacco: Never Used  Substance and Sexual Activity  . Alcohol use: No    Alcohol/week: 0.0 standard drinks  . Drug use: No  . Sexual activity: Not on file  Lifestyle  . Physical activity:    Days per week: Not on file    Minutes per session: Not on file  . Stress: Not on file  Relationships  . Social connections:    Talks on phone: Not on file    Gets together: Not on file    Attends religious service: Not on file    Active member of club or organization: Not on file    Attends meetings of clubs or organizations: Not on file    Relationship status: Not on file  . Intimate partner violence:    Fear of current or ex partner: Not on file    Emotionally abused: Not on file    Physically abused: Not on file    Forced sexual activity: Not on file  Other Topics Concern  . Not on file  Social History Narrative  . Not on file     Allergies  Allergen Reactions  . Iohexol Anaphylaxis, Hives, Shortness Of Breath and Swelling    (IOHEXOL = Omnipaque) (Radiological Contrast) Code: HIVES, Desc: Pt has had a prior IVP dye reaction,(no scans in health system).Symptoms were hives,and airway obstruction!, Onset Date: 09/29/1999   . Ace Inhibitors     REACTION: cyclic cough  . Losartan Potassium Other (See Comments)    Raises blood pressure; fatigue  . Neosporin [Neomycin-Bacitracin Zn-Polymyx] Dermatitis     Outpatient Medications Prior to Visit  Medication Sig Dispense Refill  . azelastine (ASTELIN) 0.1 % nasal spray Place 1 spray into both nostrils daily.    . benzonatate (TESSALON) 200 MG capsule Take 1 capsule (200 mg total) by mouth every 6 (six) hours as needed for cough. 90 capsule 1  . busPIRone (BUSPAR) 15 MG tablet TAKE 1/3 TO 1/2 TAB AS NEEDED FOR ANXIETY UP TO  TWICE A DAY  3  . Cholecalciferol (VITAMIN D3) LIQD Take 3 drops by mouth daily.     Marland Kitchen conjugated estrogens (PREMARIN) vaginal cream Premarin 0.625 mg/gram vaginal cream  INSERT 1GRAM VAGINALLY 3 TIMES WEEKLY AT BEDTIME    . diclofenac sodium (VOLTAREN) 1 % GEL Apply 1 application topically as needed.    . gabapentin (NEURONTIN) 300 MG capsule Take 2 capsules (600 mg total) by mouth daily. Taking 2 in the morning 60 capsule 0  . irbesartan (AVAPRO) 75 MG tablet Take 75 mg by mouth daily.    . Magnesium 500 MG TABS Take 1 tablet by mouth 2 (two) times daily.    Derrill Memo ON 08/23/2018] morphine (MS CONTIN) 30  MG 12 hr tablet Take 1 tablet (30 mg total) by mouth every 12 (twelve) hours. 60 tablet 0  . [START ON 07/24/2018] morphine (MS CONTIN) 30 MG 12 hr tablet Take 1 tablet (30 mg total) by mouth every 12 (twelve) hours. 60 tablet 0  . morphine (MS CONTIN) 30 MG 12 hr tablet Take 1 tablet (30 mg total) by mouth every 12 (twelve) hours. 60 tablet 0  . naloxone (NARCAN) nasal spray 4 mg/0.1 mL Spray into one nostril. Repeat with second device into other nostril after 2-3 minutes if no or minimal response. Use in case of opioid overdose. 1 kit 0  . PRENATAL VIT-FE FUM-FA-OMEGA PO Take 1 tablet by mouth daily.    . Probiotic Product (ALIGN) 4 MG CAPS Take 1 capsule by mouth at bedtime. Per Pt.    . promethazine-dextromethorphan (PROMETHAZINE-DM) 6.25-15 MG/5ML syrup Take 5 mLs by mouth as needed. 118 mL 0  . vitamin B-12 (CYANOCOBALAMIN) 500 MCG tablet Take 500 mcg by mouth 3 (three) times a week.    . Wheat Dextrin (BENEFIBER) POWD Stir 2 teaspoons of Benefiber into 4-8 oz of any non-carbonated beverage or soft food (hot or cold) TID. 500 g PRN  . montelukast (SINGULAIR) 10 MG tablet Take 10 mg by mouth at bedtime.    . pantoprazole (PROTONIX) 40 MG tablet Take 1 tablet (40 mg total) by mouth 2 (two) times daily. 180 tablet 1   No facility-administered medications prior to visit.           Objective:   Physical Exam Vitals:   07/13/18 1428  BP: 124/74  Pulse: 90  SpO2: 98%  Weight: 223 lb (101.2 kg)  Height: _0  (1.676 m)   Gen: Pleasant, well-nourished, in no distress,  normal affect  ENT: No lesions,  mouth clear,  oropharynx clear, no postnasal drip  Neck: No JVD, no TMG, no stridor  Lungs: No use of accessory muscles, no crackles or wheezes.   Cardiovascular: RRR, heart sounds normal, no murmur or gallops, no peripheral edema  Musculoskeletal: No deformities, no cyanosis or clubbing  Neuro: alert, non focal  Skin: Warm, no lesions or rash      Assessment & Plan:  Abnormal CXR There are a few focal areas of basilar scar without any traction bronchiectasis, overt interstitial lung disease.  This is a reassuring scan.  I do not think any other work-up for ILD or chronic infection is indicated.  Chronic cough Seems to be better with the maneuvers that we made last visit.  I suspect that the increased PPI has been the most helpful.  We will try to decrease the frequency of her pantoprazole, see if she tolerates.  I explained to her how to try to come off the medication systematically so that she can identify the good or bad effects as changes are made.  Baltazar Apo, MD, PhD 07/13/2018, 3:02 PM [facility] [phone] or if no answer [phone]

## 2018-07-20 ENCOUNTER — Other Ambulatory Visit: Payer: Self-pay

## 2018-07-20 NOTE — Telephone Encounter (Signed)
RB please advise on refill. Thanks  

## 2018-07-27 ENCOUNTER — Other Ambulatory Visit: Payer: Self-pay | Admitting: Nurse Practitioner

## 2018-07-27 ENCOUNTER — Telehealth: Payer: Self-pay | Admitting: Nurse Practitioner

## 2018-07-27 ENCOUNTER — Telehealth: Payer: Self-pay | Admitting: Emergency Medicine

## 2018-07-27 DIAGNOSIS — M792 Neuralgia and neuritis, unspecified: Secondary | ICD-10-CM

## 2018-07-27 MED ORDER — PROMETHAZINE-DM 6.25-15 MG/5ML PO SYRP: 5.0000 mL | ORAL_SOLUTION | ORAL | 0 refills | Status: DC | PRN

## 2018-07-27 MED ORDER — GABAPENTIN 300 MG PO CAPS: 600.0000 mg | ORAL_CAPSULE | Freq: Every day | ORAL | 2 refills | Status: DC

## 2018-07-27 NOTE — Telephone Encounter (Signed)
She was trying to taper off of the gabapentin.  Please call her and see where she is with the tapering process and then I will be glad to send in a refill thanks

## 2018-07-27 NOTE — Telephone Encounter (Signed)
Kari Gibbs, are you able to send Rx to pt's pharmacy using the thumb print?

## 2018-07-27 NOTE — Telephone Encounter (Signed)
sent 

## 2018-07-27 NOTE — Telephone Encounter (Signed)
Called and spoke with patient.  Informed her that Wyn Quaker, NP, sent her promethazine refill to preferred pharmacy. Patient stated understanding.  Nothing further at this time.

## 2018-07-27 NOTE — Telephone Encounter (Signed)
Called and spoke with patient, she stated that she is taking her protonix nightly, her singulair she takes nightly, she takes her steroid nasal spray as needed when she feels like she is going to cough, she takes her loratidine daily in the morning time, and she feels like her cough has improved since seeing RB last.   Aaron Edelman please advise, thank you.

## 2018-07-27 NOTE — Telephone Encounter (Signed)
Okay to refill promethezine DM cough med. She may need to contact pain management to let them know this has been refilled. Inform patient to take protonix in the AM as listed below.    Protonix 40 mg tablet  >>>Please take 1 tablet daily 15 minutes to 30 minutes before your first meal of the day as well as before your other medications >>>Try to take at the same time each day >>>take this medication daily  GERD management: >>>Avoid laying flat until 2 hours after meals >>>Elevate head of the bed including entire chest >>>Reduce size of meals and amount of fat, acid, spices, caffeine and sweets >>>If you are smoking, Please stop! >>>Decrease alcohol consumption >>>Work on maintaining a healthy weight with normal BMI    If cough is not improving then please contact our office for a OV.   Wyn Quaker FNP

## 2018-07-27 NOTE — Telephone Encounter (Signed)
Patient states that she was down to having 1 300 mg in the morning.  For the past couple of days she hasn't had anything at all.  She states that she has pregabalin 25 mg capsules and also 50 mg.  Should she begin to take these instead.  She is really hurting and would like to know what she should do.

## 2018-07-27 NOTE — Telephone Encounter (Signed)
Order sent to pharmacy. It is not a narcotic. Thumb print not needed. Please update the patient.   Wyn Quaker FNP

## 2018-07-27 NOTE — Telephone Encounter (Signed)
Called and spoke with pt who states she has had the cough x2 months now and is requesting something to be prescribed for her.  Pt stated RB had been trying to figure out if this could be acid reflux related or sinus related but she states due to about to be going out of town, pt is requesting something to be prescribed.  Pt is requesting a refill of promethazine DM and states she needs to be able to have enough of the med to last her for 2 weeks due to going out of town.  Per new office protocol, sending to APP of day. Aaron Edelman, please advise on this for pt. If you are okay with taking care of this med for pt and if you are able to do so, can you please use the thumb print to send this to pt's pharmacy as that is how we are supposed to be doing Rx's for controlled meds now instead of having the Rx printed for pt to take printed Rx to pharmacy. [facility] [address]. Thanks!

## 2018-07-27 NOTE — Telephone Encounter (Signed)
I am sorry to hear the patient is not feeling well.  If patient's cough is persisting then she will need follow-up with our office. I need more information regarding this...   Patient instructions from last office visit in December/2019 with Dr. Lamonte Sakai: We will plan to decrease your pantoprazole to 40mg  once a day.  Please continue your Singulair, nasal steroid spray and loratadine as you have been taking them.  If your cough remains stable on low-dose pantoprazole then you could consider stopping the loratadine to see if you tolerate Follow with Dr Lamonte Sakai if needed  Did she follow those instructions? How often is patient taking Protonix? Is patient still taking Singulair? This patient still taking nasal steroid spray Is patient still taking loratadine? Has the cough worsened or improved after last office visit with Dr. Lamonte Sakai?    I am open to the idea of refilling her promethazine but need the answers to these questions prior to considering that.  Patient is actively seen with pain management.  I have checked [place] PMP aware website patient has a 440 overdose risk score.  Patient with 9 MME a day.     Wyn Quaker FNP

## 2018-07-27 NOTE — Telephone Encounter (Signed)
Pt states that she wants [facility] to send in another rx for gabapentin. I told pt she may need an appt first but she told me to ask the nurses.

## 2018-07-28 ENCOUNTER — Telehealth: Payer: Self-pay | Admitting: Pulmonary Disease

## 2018-07-28 NOTE — Telephone Encounter (Signed)
She states they told her the RX was missing information they needed.  She is leaving tomorrow to go out of town and needs this today.  Please call her once pharmacy has information needed.

## 2018-07-28 NOTE — Telephone Encounter (Signed)
Called [facility] pharmacy off of [address] and spoke with DJ. Per DJ, they were needing [place] to specify how often pt was needing to take med (how many times during day, etc). I stated to DJ the last time the med was prescribed for pt, the instructions were for pt to take med every 6 hours prn.  DJ expressed understanding. Nothing further needed.

## 2018-07-28 NOTE — Telephone Encounter (Signed)
[facility]  They need clarification on Promethazine  [phone]

## 2018-07-28 NOTE — Telephone Encounter (Signed)
Called patient to let her know that [place] has called in Gabapentin 600 mg 2 capsules po in the a.m.  Patient verbalizes u/o information .

## 2018-07-30 ENCOUNTER — Telehealth: Payer: Self-pay | Admitting: Nurse Practitioner

## 2018-07-30 ENCOUNTER — Telehealth: Payer: Self-pay

## 2018-07-30 NOTE — Telephone Encounter (Signed)
Patient notified of [facility] message.

## 2018-07-30 NOTE — Telephone Encounter (Signed)
If this is an acute knee problem which is related to a fall is okay for her to get additional pain medication if needed

## 2018-07-30 NOTE — Telephone Encounter (Signed)
Went to [facility] and they wanted to write some additional pain meds for her knee that she fell on. She told them she would have to make sure she was able to do that w/ being contracted at the pain clinic. Please advise what patient should do

## 2018-09-07 ENCOUNTER — Encounter: Payer: Self-pay | Admitting: Nurse Practitioner

## 2018-09-07 ENCOUNTER — Ambulatory Visit: Payer: Medicare Other | Attending: Nurse Practitioner | Admitting: Nurse Practitioner

## 2018-09-07 ENCOUNTER — Other Ambulatory Visit: Payer: Self-pay

## 2018-09-07 VITALS — BP 140/75 | HR 72 | Temp 97.8°F | Ht 66.0 in | Wt 220.0 lb

## 2018-09-07 DIAGNOSIS — M533 Sacrococcygeal disorders, not elsewhere classified: Secondary | ICD-10-CM | POA: Insufficient documentation

## 2018-09-07 DIAGNOSIS — R1011 Right upper quadrant pain: Secondary | ICD-10-CM | POA: Diagnosis not present

## 2018-09-07 DIAGNOSIS — R1012 Left upper quadrant pain: Secondary | ICD-10-CM

## 2018-09-07 DIAGNOSIS — T402X5A Adverse effect of other opioids, initial encounter: Secondary | ICD-10-CM | POA: Diagnosis present

## 2018-09-07 DIAGNOSIS — K5903 Drug induced constipation: Secondary | ICD-10-CM | POA: Diagnosis present

## 2018-09-07 DIAGNOSIS — M792 Neuralgia and neuritis, unspecified: Secondary | ICD-10-CM | POA: Insufficient documentation

## 2018-09-07 DIAGNOSIS — G8929 Other chronic pain: Secondary | ICD-10-CM | POA: Diagnosis present

## 2018-09-07 DIAGNOSIS — M47812 Spondylosis without myelopathy or radiculopathy, cervical region: Secondary | ICD-10-CM | POA: Insufficient documentation

## 2018-09-07 DIAGNOSIS — M5388 Other specified dorsopathies, sacral and sacrococcygeal region: Secondary | ICD-10-CM | POA: Diagnosis not present

## 2018-09-07 DIAGNOSIS — Z79891 Long term (current) use of opiate analgesic: Secondary | ICD-10-CM | POA: Diagnosis present

## 2018-09-07 DIAGNOSIS — G894 Chronic pain syndrome: Secondary | ICD-10-CM | POA: Insufficient documentation

## 2018-09-07 MED ORDER — MORPHINE SULFATE ER 30 MG PO TBCR: 30.0000 mg | EXTENDED_RELEASE_TABLET | Freq: Two times a day (BID) | ORAL | 0 refills | Status: DC

## 2018-09-07 MED ORDER — GABAPENTIN 300 MG PO CAPS: 300.0000 mg | ORAL_CAPSULE | Freq: Two times a day (BID) | ORAL | 0 refills | Status: DC

## 2018-09-07 NOTE — Progress Notes (Signed)
Patient's Name: Kari Gibbs  MRN: [id number]  Referring Provider: Cari Caraway, MD  DOB: 14-Oct-1950  PCP: Cari Caraway, MD  DOS: 09/07/2018  Note by: Vevelyn Francois NP  Service setting: Ambulatory outpatient  Specialty: Interventional Pain Management  Location: [facility] (AMB) Pain Management Facility    Patient type: Established    Primary Reason(s) for Visit: Encounter for prescription drug management. (Level of risk: moderate)  CC: Abdominal Pain (left side)  HPI  Kari Gibbs is a 68 y.o. year old, female patient, who comes today for a medication management evaluation. 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; S/P laparoscopic cholecystectomy Oct 2014; Fracture of rib (4th and 8th rib) (Left); Long term current use of opiate analgesic; Long term prescription opiate use; Opiate use (60 MME/day); Encounter for therapeutic drug level monitoring; Opiate dependence ([place]); Encounter for chronic pain management; Chronic pain syndrome; Chronic flank pain (Primary Source of Pain) (Left); Chronic thoracic radicular pain (Left-sided); Opioid-induced constipation (OIC); Chronic occipital neuralgia (Left); Neuropathic pain; Neurogenic pain; History of rib fracture (4th and 8th rib) (Left); Vertebral body hemangioma (T11); Metatarsalgia of foot (Right); Chronic abdominal pain (Bilateral) (L>R) (possible left-sided thoracic radiculopathy); Osteoarthritis; Return to work evaluation (FCE: Medium); Varicose veins of bilateral lower extremities with other complications; Varicose veins of left lower extremity with complications; Chronic neck pain (Bilateral) (L>R); Cervical facet syndrome (Bilateral) (L>R); Muscle spasticity; Tremors of nervous system; Muscle twitching; Cervical spondylosis; Chronic low back pain (Bilateral) w/o sciatica; Acute costochondritis (Left); Morbid obesity ([place]); Chronic  low back pain (Bilateral) w/ sciatica (Bilateral); Chronic sacroiliac joint pain (Bilateral); History Allergy to Contrast; Other specified dorsopathies, sacral and sacrococcygeal region; Chronic sacroiliac joint somatic dysfunction (Bilateral); Abnormal CXR; and Chronic cough on their problem list. Her primarily concern today is the Abdominal Pain (left side)  Pain Assessment: Location: Left Abdomen Radiating: pain radiaties to my rib cage Onset: More than a month ago Duration: Chronic pain Quality: Constant Severity: 4 /10 (subjective, self-reported pain score)  Note: Reported level is compatible with observation.                          Effect on ADL: limits my daily activities Timing: Constant Modifying factors: medications BP: 140/75  HR: 72  Kari Gibbs was last scheduled for an appointment on 07/30/2018 for medication management. During today's appointment we reviewed Kari Gibbs's chronic pain status, as well as her outpatient medication regimen. She denies any concerns today. She has not been able to wean off the Gabapentin. She is using is BID.  The patient  reports no history of drug use. Her body mass index is 35.51 kg/m.  Further details on both, my assessment(s), as well as the proposed treatment plan, please see below.  Controlled Substance Pharmacotherapy Assessment REMS (Risk Evaluation and Mitigation Strategy)  Analgesic:MS Contin 30 mg every 12 hours (60 mg/day) MME/day:60 mg/day .  Chauncey Fischer, RN  09/07/2018  1:29 PM  Sign when Signing Visit Nursing Pain Medication Assessment:  Safety precautions to be maintained throughout the outpatient stay will include: orient to surroundings, keep bed in low position, maintain call bell within reach at all times, provide assistance with transfer out of bed and ambulation.  Medication Inspection Compliance: Pill count conducted under aseptic conditions, in front of the patient. Neither the pills nor the bottle was  removed from the patient's sight at any time.  Once count was completed pills were immediately returned to the patient in their original bottle.  Medication: Morphine IR Pill/Patch Count: 29 of 60 pills remain Pill/Patch Appearance: Markings consistent with prescribed medication Bottle Appearance: Standard pharmacy container. Clearly labeled. Filled Date: 1 / 45 / 2020 Last Medication intake:  Today   Pharmacokinetics: Liberation and absorption (onset of action): WNL Distribution (time to peak effect): WNL Metabolism and excretion (duration of action): WNL         Pharmacodynamics: Desired effects: Analgesia: Kari Gibbs reports >50% benefit. Functional ability: Patient reports that medication allows her to accomplish basic ADLs Clinically meaningful improvement in function (CMIF): Sustained CMIF goals met Perceived effectiveness: Described as relatively effective, allowing for increase in activities of daily living (ADL) Undesirable effects: Side-effects or Adverse reactions: None reported Monitoring: [place] PMP: Online review of the past 70-monthperiod conducted. Compliant with practice rules and regulations Last UDS on record: Summary  Date Value Ref Range Status  03/25/2018 FINAL  Final    Comment:    ==================================================================== TOXASSURE SELECT 13 (MW) ==================================================================== Test                             Result       Flag       Units Drug Present and Declared for Prescription Verification   Morphine                       2254         EXPECTED   ng/mg creat   Normorphine                    237          EXPECTED   ng/mg creat    Potential sources of large amounts of morphine in the absence of    codeine include administration of morphine or use of heroin.    Normorphine is an expected metabolite of morphine.   Hydromorphone                  60           EXPECTED   ng/mg creat     Hydromorphone may be present as a metabolite of morphine;    concentrations of hydromorphone rarely exceed 5% of the morphine    concentration when this is the source of hydromorphone. ==================================================================== Test                      Result    Flag   Units      Ref Range   Creatinine              179              mg/dL      >=20 ==================================================================== Declared Medications:  The flagging and interpretation on this report are based on the  following declared medications.  Unexpected results may arise from  inaccuracies in the declared medications.  **Note: The testing scope of this panel includes these medications:  Morphine (MS Contin)  **Note: The testing scope of this panel does not include following  reported medications:  Buspirone  Conjugated Estrogens  Cyanocobalamin  Denosumab  Gabapentin  Magnesium  Montelukast  Multivitamin (Prenatal Vitamin)  Naloxone  Omeprazole  Pregabalin  Supplement  Supplement (Probiotic)  Vitamin D3 ==================================================================== For clinical consultation, please call ([phone] ====================================================================    UDS  interpretation: Compliant          Medication Assessment Form: Reviewed. Patient indicates being compliant with therapy Treatment compliance: Compliant Risk Assessment Profile: Aberrant behavior: See prior evaluations. None observed or detected today Comorbid factors increasing risk of overdose: See prior notes. No additional risks detected today Opioid risk tool (ORT) (Total Score): 0 Personal History of Substance Abuse (SUD-Substance use disorder):  Alcohol: Negative  Illegal Drugs: Negative  Rx Drugs: Negative  ORT Risk Level calculation: Low Risk Risk of substance use disorder (SUD): Low Opioid Risk Tool - 09/07/18 1341      Family History of  Substance Abuse   Alcohol  Negative    Illegal Drugs  Negative    Rx Drugs  Negative      Personal History of Substance Abuse   Alcohol  Negative    Illegal Drugs  Negative    Rx Drugs  Negative      Age   Age between 40-45 years   No      History of Preadolescent Sexual Abuse   History of Preadolescent Sexual Abuse  Negative or Female      Psychological Disease   Psychological Disease  Negative    Depression  Negative      Total Score   Opioid Risk Tool Scoring  0    Opioid Risk Interpretation  Low Risk      ORT Scoring interpretation table:  Score <3 = Low Risk for SUD  Score between 4-7 = Moderate Risk for SUD  Score >8 = High Risk for Opioid Abuse   Risk Mitigation Strategies:  Patient Counseling: Covered Patient-Prescriber Agreement (PPA): Present and active  Notification to other healthcare providers: Done  Pharmacologic Plan: No change in therapy, at this time.             Laboratory Chemistry  Inflammation Markers (CRP: Acute Phase) (ESR: Chronic Phase) Lab Results  Component Value Date   CRP <0.5 01/04/2016   ESRSEDRATE 17 01/04/2016                         Rheumatology Markers No results found for: RF, ANA, LABURIC, URICUR, LYMEIGGIGMAB, LYMEABIGMQN, HLAB27                      Renal Function Markers Lab Results  Component Value Date   BUN 20 01/04/2016   CREATININE 0.76 01/04/2016   GFRAA >60 01/04/2016   GFRNONAA >60 01/04/2016                             Hepatic Function Markers Lab Results  Component Value Date   AST 33 01/04/2016   ALT 26 01/04/2016   ALBUMIN 4.2 01/04/2016   ALKPHOS 97 01/04/2016   AMYLASE 30 02/27/2010   LIPASE 22 10/12/2012                        Electrolytes Lab Results  Component Value Date   NA 139 01/04/2016   K 4.6 01/04/2016   CL 99 (L) 01/04/2016   CALCIUM 9.7 01/04/2016   MG 2.1 01/04/2016                        Neuropathy Markers Lab Results  Component Value Date   VITAMINB12 321 01/04/2016  CNS Tests No results found for: COLORCSF, APPEARCSF, RBCCOUNTCSF, WBCCSF, POLYSCSF, LYMPHSCSF, EOSCSF, PROTEINCSF, GLUCCSF, JCVIRUS, CSFOLI, IGGCSF                      Bone Pathology Markers Lab Results  Component Value Date   25OHVITD1 49 01/04/2016   25OHVITD2 25 01/04/2016   25OHVITD3 24 01/04/2016                         Coagulation Parameters Lab Results  Component Value Date   PLT 238 11/18/2014   DDIMER  02/06/2010    <0.22        AT THE INHOUSE ESTABLISHED CUTOFF VALUE OF 0.48 ug/mL FEU, THIS ASSAY HAS BEEN DOCUMENTED IN THE LITERATURE TO HAVE A SENSITIVITY AND NEGATIVE PREDICTIVE VALUE OF AT LEAST 98 TO 99%.  THE TEST RESULT SHOULD BE CORRELATED WITH AN ASSESSMENT OF THE CLINICAL PROBABILITY OF DVT / VTE.                        Cardiovascular Markers Lab Results  Component Value Date   CKTOTAL 39 02/06/2010   CKMB 0.4 02/06/2010   TROPONINI 0.01        NO INDICATION OF MYOCARDIAL INJURY. 02/06/2010   HGB 11.1 (L) 11/18/2014   HCT 34.1 (L) 11/18/2014                         CA Markers No results found for: CEA, CA125, LABCA2                      Note: Lab results reviewed.  Recent Diagnostic Imaging Results  CT Chest High Resolution CLINICAL DATA:  One-month history of cough. Evaluate for interstitial lung disease.  EXAM: CT CHEST WITHOUT CONTRAST  TECHNIQUE: Multidetector CT imaging of the chest was performed following the standard protocol without intravenous contrast. High resolution imaging of the lungs, as well as inspiratory and expiratory imaging, was performed.  COMPARISON:  Chest radiograph 06/03/2018.  Chest CT 11/24/2013.  FINDINGS: Cardiovascular: Atherosclerotic calcification of the arterial vasculature. Heart is at the upper limits of normal in size. No pericardial effusion.  Mediastinum/Nodes: Mediastinal lymph nodes are not enlarged by CT size criteria. Hilar regions are difficult to evaluate without  IV contrast. No axillary adenopathy. Esophagus is grossly unremarkable.  Lungs/Pleura: Linear scarring, bronchiectasis and volume loss in the right middle lobe, lingula and both lower lobes. Negative for subpleural reticulation, traction bronchiectasis/bronchiolectasis, ground-glass, architectural distortion or honeycombing. Minimal air trapping. No pleural fluid. Airway is unremarkable.  Upper Abdomen: Visualized portion of the liver is unremarkable. Common bile duct dilatation may be related to cholecystectomy. Right adrenal gland is unremarkable. 11 mm left adrenal nodule measures 21 Hounsfield units and is likely unchanged. Tiny stone in the left kidney. Visualized portions of the spleen and pancreas are grossly unremarkable. Gastric bypass. No upper abdominal adenopathy.  Musculoskeletal: No worrisome lytic or sclerotic lesions.  IMPRESSION: 1. Negative for interstitial lung disease. 2. Minimal air trapping, indicative of small airways disease. 3. Probable small left adrenal adenoma. 4. Tiny left renal stone. 5. Aortic atherosclerosis (ICD10-170.0).  Electronically Signed   By: Lorin Picket M.D.   On: 06/25/2018 08:30  Complexity Note: Imaging results reviewed. Results shared with Kari Gibbs, using Layman's terms.  Meds   Current Outpatient Medications:  .  azelastine (ASTELIN) 0.1 % nasal spray, Place 1 spray into both nostrils daily., Disp: , Rfl:  .  benzonatate (TESSALON) 200 MG capsule, Take 1 capsule (200 mg total) by mouth every 6 (six) hours as needed for cough., Disp: 90 capsule, Rfl: 1 .  busPIRone (BUSPAR) 15 MG tablet, TAKE 1/3 TO 1/2 TAB AS NEEDED FOR ANXIETY UP TO TWICE A DAY, Disp: , Rfl: 3 .  Cholecalciferol (VITAMIN D3) LIQD, Take 3 drops by mouth daily. , Disp: , Rfl:  .  conjugated estrogens (PREMARIN) vaginal cream, Premarin 0.625 mg/gram vaginal cream  INSERT 1GRAM VAGINALLY 3 TIMES WEEKLY AT BEDTIME, Disp: , Rfl:  .   diclofenac sodium (VOLTAREN) 1 % GEL, Apply 1 application topically as needed., Disp: , Rfl:  .  irbesartan (AVAPRO) 75 MG tablet, Take 75 mg by mouth daily., Disp: , Rfl:  .  Magnesium 500 MG TABS, Take 1 tablet by mouth 2 (two) times daily., Disp: , Rfl:  .  montelukast (SINGULAIR) 10 MG tablet, Take 1 tablet (10 mg total) by mouth at bedtime., Disp: 90 tablet, Rfl: 3 .  naloxone (NARCAN) nasal spray 4 mg/0.1 mL, Spray into one nostril. Repeat with second device into other nostril after 2-3 minutes if no or minimal response. Use in case of opioid overdose., Disp: 1 kit, Rfl: 0 .  pantoprazole (PROTONIX) 40 MG tablet, Take 1 tablet (40 mg total) by mouth daily., Disp: 90 tablet, Rfl: 3 .  PRENATAL VIT-FE FUM-FA-OMEGA PO, Take 1 tablet by mouth daily., Disp: , Rfl:  .  Probiotic Product (ALIGN) 4 MG CAPS, Take 1 capsule by mouth at bedtime. Per Pt., Disp: , Rfl:  .  promethazine-dextromethorphan (PROMETHAZINE-DM) 6.25-15 MG/5ML syrup, Take 5 mLs by mouth as needed., Disp: 118 mL, Rfl: 0 .  vitamin B-12 (CYANOCOBALAMIN) 500 MCG tablet, Take 500 mcg by mouth 3 (three) times a week., Disp: , Rfl:  .  Wheat Dextrin (BENEFIBER) POWD, Stir 2 teaspoons of Benefiber into 4-8 oz of any non-carbonated beverage or soft food (hot or cold) TID., Disp: 500 g, Rfl: PRN .  gabapentin (NEURONTIN) 300 MG capsule, Take 1 capsule (300 mg total) by mouth 2 (two) times daily. Taking 2 in the morning, Disp: 180 capsule, Rfl: 0 .  [START ON 11/21/2018] morphine (MS CONTIN) 30 MG 12 hr tablet, Take 1 tablet (30 mg total) by mouth every 12 (twelve) hours for 30 days., Disp: 60 tablet, Rfl: 0 .  [START ON 10/22/2018] morphine (MS CONTIN) 30 MG 12 hr tablet, Take 1 tablet (30 mg total) by mouth every 12 (twelve) hours for 30 days., Disp: 60 tablet, Rfl: 0 .  [START ON 09/22/2018] morphine (MS CONTIN) 30 MG 12 hr tablet, Take 1 tablet (30 mg total) by mouth every 12 (twelve) hours for 30 days., Disp: 60 tablet, Rfl: 0  ROS   Constitutional: Denies any fever or chills Gastrointestinal: No reported hemesis, hematochezia, vomiting, or acute GI distress Musculoskeletal: Denies any acute onset joint swelling, redness, loss of ROM, or weakness Neurological: No reported episodes of acute onset apraxia, aphasia, dysarthria, agnosia, amnesia, paralysis, loss of coordination, or loss of consciousness  Allergies  Kari Gibbs is allergic to iohexol; ace inhibitors; losartan potassium; and neosporin [neomycin-bacitracin zn-polymyx].  [place]  Drug: Kari Gibbs  reports no history of drug use. Alcohol:  reports no history of alcohol use. Tobacco:  reports that she has never smoked. She has never used smokeless tobacco. 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 Family: family history includes Alzheimer's disease in her father; Hypertension in her mother.  Constitutional Exam  General appearance: Well nourished, well developed, and well hydrated. In no apparent acute distress Vitals:   09/07/18 1329  BP: 140/75  Pulse: 72  Temp: 97.8 F (36.6 C)  SpO2: 98%  Weight: 220 lb (99.8 kg)   Height: 5' 6" (1.676 m)   Psych/Mental status: Alert, oriented x 3 (person, place, & time)       Eyes: PERLA Respiratory: No evidence of acute respiratory distress  Cervical Spine Area Exam  Skin & Axial Inspection: No masses, redness, edema, swelling, or associated skin lesions Alignment: Symmetrical Functional ROM: Unrestricted ROM      Stability: No instability detected Muscle Tone/Strength: Functionally intact. No obvious neuro-muscular anomalies detected. Sensory (Neurological): Unimpaired Palpation: No palpable anomalies              Upper Extremity (UE) Exam    Side: Right upper extremity  Side: Left upper extremity  Skin & Extremity Inspection: Skin color, temperature, and hair growth are WNL. No peripheral edema or cyanosis. No masses, redness, swelling, asymmetry, or associated skin lesions. No contractures.  Skin & Extremity Inspection: Skin color, temperature, and hair growth are WNL. No peripheral edema or cyanosis. No masses, redness, swelling, asymmetry, or associated skin lesions. No contractures.  Functional ROM: Unrestricted ROM          Functional ROM: Unrestricted ROM          Muscle Tone/Strength: Functionally intact. No obvious neuro-muscular anomalies detected.  Muscle Tone/Strength: Functionally intact. No obvious neuro-muscular anomalies detected.  Sensory (Neurological): Unimpaired          Sensory (Neurological): Unimpaired          Palpation: No palpable anomalies              Palpation: No palpable anomalies              Provocative Test(s):  Phalen's test: deferred Tinel's test: deferred Apley's scratch test (touch opposite shoulder):  Action 1 (Across chest): deferred Action 2 (Overhead): deferred Action 3 (LB reach): deferred   Provocative Test(s):  Phalen's test: deferred Tinel's test: deferred Apley's scratch test (touch opposite shoulder):  Action 1 (Across chest): deferred Action 2 (Overhead): deferred Action 3 (LB reach): deferred     Thoracic Spine Area Exam  Skin & Axial Inspection: No masses, redness, or swelling Alignment: Symmetrical Functional ROM: Unrestricted ROM Stability: No instability detected Muscle Tone/Strength: Functionally intact. No obvious neuro-muscular anomalies detected. Sensory (Neurological): Unimpaired Muscle strength & Tone: No palpable anomalies  Lumbar Spine Area Exam  Skin & Axial Inspection: No masses, redness, or swelling Alignment: Symmetrical Functional ROM: Unrestricted ROM       Stability: No instability detected Muscle Tone/Strength: Functionally intact. No obvious neuro-muscular anomalies detected. Sensory (Neurological): Unimpaired Palpation: No  palpable anomalies       Provocative Tests: Hyperextension/rotation test: deferred today       Lumbar quadrant test (Kemp's test): deferred today       Lateral bending test: deferred today       Patrick's Maneuver: deferred today                   FABER* test: deferred today                   S-I anterior distraction/compression test: deferred today         S-I lateral compression test: deferred today         S-I Thigh-thrust test: deferred today         S-I Gaenslen's test: deferred today         *(Flexion, ABduction and External Rotation)  Gait & Posture Assessment  Ambulation: Unassisted Gait: Relatively normal for age and body habitus Posture: WNL   Lower Extremity Exam    Side: Right lower extremity  Side: Left lower extremity  Stability: No instability observed          Stability: No instability observed          Skin & Extremity Inspection: Skin color, temperature, and hair growth are WNL. No peripheral edema or cyanosis. No masses, redness, swelling, asymmetry, or associated skin lesions. No contractures.  Skin & Extremity Inspection: Skin color, temperature, and hair growth are WNL. No peripheral edema or cyanosis. No masses, redness, swelling, asymmetry, or associated skin lesions. No contractures.  Functional ROM:  Unrestricted ROM                  Functional ROM: Unrestricted ROM                  Muscle Tone/Strength: Functionally intact. No obvious neuro-muscular anomalies detected.  Muscle Tone/Strength: Functionally intact. No obvious neuro-muscular anomalies detected.  Sensory (Neurological): Unimpaired        Sensory (Neurological): Unimpaired        DTR: Patellar: deferred today Achilles: deferred today Plantar: deferred today  DTR: Patellar: deferred today Achilles: deferred today Plantar: deferred today  Palpation: No palpable anomalies  Palpation: No palpable anomalies   Assessment  Primary Diagnosis & Pertinent Problem List: The primary encounter diagnosis was Chronic abdominal pain (Bilateral) (L>R) (possible left-sided thoracic radiculopathy). Diagnoses of Other specified dorsopathies, sacral and sacrococcygeal region, Cervical spondylosis, Chronic sacroiliac joint pain (Bilateral), Neurogenic pain, Chronic pain syndrome, Therapeutic opioid-induced constipation (OIC), and Long term prescription opiate use were also pertinent to this visit.  Status Diagnosis  Controlled Controlled Controlled 1. Chronic abdominal pain (Bilateral) (L>R) (possible left-sided thoracic radiculopathy)   2. Other specified dorsopathies, sacral and sacrococcygeal region   3. Cervical spondylosis   4. Chronic sacroiliac joint pain (Bilateral)   5. Neurogenic pain   6. Chronic pain syndrome   7. Therapeutic opioid-induced constipation (OIC)   8. Long term prescription opiate use     Problems updated and reviewed during this visit: No problems updated. Plan of Care  Pharmacotherapy (Medications Ordered): Meds ordered this encounter  Medications  . gabapentin (NEURONTIN) 300 MG capsule    Sig: Take 1 capsule (300 mg total) by mouth 2 (two) times daily. Taking 2 in the morning    Dispense:  180 capsule    Refill:  0    Do not place this medication, or any other prescription from our practice, on  "Automatic Refill". Patient may have prescription filled  one day early if pharmacy is closed on scheduled refill date.    Order Specific Question:   Supervising Provider    Answer:   [name] [phone]  . morphine (MS CONTIN) 30 MG 12 hr tablet    Sig: Take 1 tablet (30 mg total) by mouth every 12 (twelve) hours for 30 days.    Dispense:  60 tablet    Refill:  0    Do not place this medication, or any other prescription from our practice, on "Automatic Refill". Patient may have prescription filled one day early if pharmacy is closed on scheduled refill date.    Order Specific Question:   Supervising Provider    Answer:   [name] [phone]  . morphine (MS CONTIN) 30 MG 12 hr tablet    Sig: Take 1 tablet (30 mg total) by mouth every 12 (twelve) hours for 30 days.    Dispense:  60 tablet    Refill:  0    Do not place this medication, or any other prescription from our practice, on "Automatic Refill". Patient may have prescription filled one day early if pharmacy is closed on scheduled refill date.    Order Specific Question:   Supervising Provider    Answer:   [name] [phone]  . morphine (MS CONTIN) 30 MG 12 hr tablet    Sig: Take 1 tablet (30 mg total) by mouth every 12 (twelve) hours for 30 days.    Dispense:  60 tablet    Refill:  0    Do not place this medication, or any other prescription from our practice, on "Automatic Refill". Patient may have prescription filled one day early if pharmacy is closed on scheduled refill date.    Order Specific Question:   Supervising Provider    Answer:   [name] [[id number]]   New Prescriptions   No medications on file   Medications administered today: Kari Gibbs had no medications administered during this visit. Lab-work, procedure(s), and/or referral(s): Orders Placed This Encounter  Procedures  . ToxASSURE Select 13 (MW), Urine   Imaging and/or referral(s): None  Planned, scheduled, and/or  pending: Not at this time.    Considering: Left greater occipital nerve RFA  Diagnostic left C2 + TON nerve block  Possible left C2 + TON   Palliative PRN treatment(s): Left greater occipital nerve RFA    Provider-requested follow-up: Return in about 3 months (around 12/07/2018) for MedMgmt.  Future Appointments  Date Time Provider [place]  12/07/2018  1:30 PM Vevelyn Francois, NP [facility] None   Primary Care Physician: Cari Caraway, MD Location: [facility] Note by: Vevelyn Francois NP Date: 09/07/2018; Time: 8:29 AM  Pain Score Disclaimer: We use the NRS-11 scale. This is a self-reported, subjective measurement of pain severity with only modest accuracy. It is used primarily to identify changes within a particular patient. It must be understood that outpatient pain scales are significantly less accurate that those used for research, where they can be applied under ideal controlled circumstances with minimal exposure to variables. In reality, the score is likely to be a combination of pain intensity and pain affect, where pain affect describes the degree of emotional arousal or changes in action readiness caused by the sensory experience of pain. Factors such as social and work situation, setting, emotional state, anxiety levels, expectation, and prior pain experience may influence pain perception and show large inter-individual differences that may also be affected by time variables.  Patient instructions provided during this appointment: Patient Instructions   Morphine to last until 12/21/2018 and gabapentin have been escribed to your respective pharmacies.____________________________________________________________________________________________  Medication Rules  Purpose: To inform patients, and their family members, of our rules and regulations.  Applies to: All patients receiving prescriptions (written or  electronic).  Pharmacy of record: Pharmacy where electronic prescriptions will be sent. If written prescriptions are taken to a different pharmacy, please inform the nursing staff. The pharmacy listed in the electronic medical record should be the one where you would like electronic prescriptions to be sent.  Electronic prescriptions: In compliance with the [place] (STOP) Act of 2017 (Session Lanny Cramp [phone]), effective August 12, 2018, all controlled substances must be electronically prescribed. Calling prescriptions to the pharmacy will cease to exist.  Prescription refills: Only during scheduled appointments. Applies to all prescriptions.  NOTE: The following applies primarily to controlled substances (Opioid* Pain Medications).   Patient's responsibilities: 1. Pain Pills: Bring all pain pills to every appointment (except for procedure appointments). 2. Pill Bottles: Bring pills in original pharmacy bottle. Always bring the newest bottle. Bring bottle, even if empty. 3. Medication refills: You are responsible for knowing and keeping track of what medications you take and those you need refilled. The day before your appointment: write a list of all prescriptions that need to be refilled. The day of the appointment: give the list to the admitting nurse. Prescriptions will be written only during appointments. If you forget a medication: it will not be "Called in", "Faxed", or "electronically sent". You will need to get another appointment to get these prescribed. No early refills. Do not call asking to have your prescription filled early. 4. Prescription Accuracy: You are responsible for carefully inspecting your prescriptions before leaving our office. Have the discharge nurse carefully go over each prescription with you, before taking them home. Make sure that your name is accurately spelled, that your address is correct. Check the name and dose of your  medication to make sure it is accurate. Check the number of pills, and the written instructions to make sure they are clear and accurate. Make sure that you are given enough medication to last until your next medication refill appointment. 5. Taking Medication: Take medication as prescribed. When it comes to controlled substances, taking less pills or less frequently than prescribed is permitted and encouraged. Never take more pills than instructed. Never take medication more frequently than prescribed.  6. Inform other Doctors: Always inform, all of your healthcare providers, of all the medications you take. 7. Pain Medication from other Providers: You are not allowed to accept any additional pain medication from any other Doctor or Healthcare provider. There are two exceptions to this rule. (see below) In the event that you require additional pain medication, you are responsible for notifying us, as stated below. 8. Medication Agreement: You are responsible for carefully reading and following our Medication Agreement. This must be signed before receiving any prescriptions from our practice. Safely store a copy of your signed Agreement. Violations to the Agreement will result in no further prescriptions. (Additional copies of our Medication Agreement are available upon request.) 9. Laws, Rules, & Regulations: All patients are expected to follow all Federal and [employer], Rules, [employer]. Ignorance of the Laws does not constitute a valid excuse. The use of any illegal substances is prohibited. 10. Adopted CDC guidelines & recommendations: Target dosing levels will be at or below 60 MME/day. Use of benzodiazepines**  is not recommended.  Exceptions: There are only two exceptions to the rule of not receiving pain medications from other Healthcare Providers. 1. Exception #1 (Emergencies): In the event of an emergency (i.e.: accident requiring emergency care), you are allowed to receive  additional pain medication. However, you are responsible for: As soon as you are able, call our office [phone], at any time of the day or night, and leave a message stating your name, the date and nature of the emergency, and the name and dose of the medication prescribed. In the event that your call is answered by a member of our staff, make sure to document and save the date, time, and the name of the person that took your information.  2. Exception #2 (Planned Surgery): In the event that you are scheduled by another doctor or dentist to have any type of surgery or procedure, you are allowed (for a period no longer than 30 days), to receive additional pain medication, for the acute post-op pain. However, in this case, you are responsible for picking up a copy of our "Post-op Pain Management for Surgeons" handout, and giving it to your surgeon or dentist. This document is available at our office, and does not require an appointment to obtain it. Simply go to our office during business hours (Monday-Thursday from 8:00 AM to 4:00 PM) (Friday 8:00 AM to 12:00 Noon) or if you have a scheduled appointment with [place], prior to your surgery, and ask for it by name. In addition, you will need to provide [place] with your name, name of your surgeon, type of surgery, and date of procedure or surgery.  *Opioid medications include: morphine, codeine, oxycodone, oxymorphone, hydrocodone, hydromorphone, meperidine, tramadol, tapentadol, buprenorphine, fentanyl, methadone. **Benzodiazepine medications include: diazepam (Valium), alprazolam (Xanax), clonazepam (Klonopine), lorazepam (Ativan), clorazepate (Tranxene), chlordiazepoxide (Librium), estazolam (Prosom), oxazepam (Serax), temazepam (Restoril), triazolam (Halcion) (Last updated: 10/09/2017) ____________________________________________________________________________________________  BMI Assessment: Estimated body mass index is 35.99 kg/m as calculated from the  following:   Height as of 07/13/18: 5' 6" (1.676 m).   Weight as of 07/13/18: 223 lb (101.2 kg).  BMI interpretation table: BMI level Category Range association with higher incidence of chronic pain  <18 kg/m2 Underweight   18.5-24.9 kg/m2 Ideal body weight   25-29.9 kg/m2 Overweight Increased incidence by 20%  30-34.9 kg/m2 Obese (Class I) Increased incidence by 68%  35-39.9 kg/m2 Severe obesity (Class II) Increased incidence by 136%  >40 kg/m2 Extreme obesity (Class III) Increased incidence by 254%   Patient's current BMI Ideal Body weight  There is no height or weight on file to calculate BMI. Patient weight not recorded   BMI Readings from Last 4 Encounters:  07/13/18 35.99 kg/m  06/17/18 35.51 kg/m  06/09/18 36.96 kg/m  04/22/18 35.51 kg/m   Wt Readings from Last 4 Encounters:  07/13/18 223 lb (101.2 kg)  06/17/18 220 lb (99.8 kg)  06/09/18 229 lb (103.9 kg)  04/22/18 220 lb (99.8 kg)

## 2018-09-07 NOTE — Patient Instructions (Addendum)
Morphine to last until 12/21/2018 and gabapentin have been escribed to your respective pharmacies.____________________________________________________________________________________________  Medication Rules  Purpose: To inform patients, and their family members, of our rules and regulations.  Applies to: All patients receiving prescriptions (written or electronic).  Pharmacy of record: Pharmacy where electronic prescriptions will be sent. If written prescriptions are taken to a different pharmacy, please inform the nursing staff. The pharmacy listed in the electronic medical record should be the one where you would like electronic prescriptions to be sent.  Electronic prescriptions: In compliance with the [place] (STOP) Act of 2017 (Session Lanny Cramp [phone]), effective August 12, 2018, all controlled substances must be electronically prescribed. Calling prescriptions to the pharmacy will cease to exist.  Prescription refills: Only during scheduled appointments. Applies to all prescriptions.  NOTE: The following applies primarily to controlled substances (Opioid* Pain Medications).   Patient's responsibilities: 1. Pain Pills: Bring all pain pills to every appointment (except for procedure appointments). 2. Pill Bottles: Bring pills in original pharmacy bottle. Always bring the newest bottle. Bring bottle, even if empty. 3. Medication refills: You are responsible for knowing and keeping track of what medications you take and those you need refilled. The day before your appointment: write a list of all prescriptions that need to be refilled. The day of the appointment: give the list to the admitting nurse. Prescriptions will be written only during appointments. If you forget a medication: it will not be "Called in", "Faxed", or "electronically sent". You will need to get another appointment to get these prescribed. No early refills. Do not call asking  to have your prescription filled early. 4. Prescription Accuracy: You are responsible for carefully inspecting your prescriptions before leaving our office. Have the discharge nurse carefully go over each prescription with you, before taking them home. Make sure that your name is accurately spelled, that your address is correct. Check the name and dose of your medication to make sure it is accurate. Check the number of pills, and the written instructions to make sure they are clear and accurate. Make sure that you are given enough medication to last until your next medication refill appointment. 5. Taking Medication: Take medication as prescribed. When it comes to controlled substances, taking less pills or less frequently than prescribed is permitted and encouraged. Never take more pills than instructed. Never take medication more frequently than prescribed.  6. Inform other Doctors: Always inform, all of your healthcare providers, of all the medications you take. 7. Pain Medication from other Providers: You are not allowed to accept any additional pain medication from any other Doctor or Healthcare provider. There are two exceptions to this rule. (see below) In the event that you require additional pain medication, you are responsible for notifying us, as stated below. 8. Medication Agreement: You are responsible for carefully reading and following our Medication Agreement. This must be signed before receiving any prescriptions from our practice. Safely store a copy of your signed Agreement. Violations to the Agreement will result in no further prescriptions. (Additional copies of our Medication Agreement are available upon request.) 9. Laws, Rules, & Regulations: All patients are expected to follow all Federal and [employer], [employer], Rules, [employer]. Ignorance of the Laws does not constitute a valid excuse. The use of any illegal substances is prohibited. 10. Adopted CDC guidelines &  recommendations: Target dosing levels will be at or below 60 MME/day. Use of benzodiazepines** is not recommended.  Exceptions: There are only two exceptions  to the rule of not receiving pain medications from other Healthcare Providers. 1. Exception #1 (Emergencies): In the event of an emergency (i.e.: accident requiring emergency care), you are allowed to receive additional pain medication. However, you are responsible for: As soon as you are able, call our office [phone], at any time of the day or night, and leave a message stating your name, the date and nature of the emergency, and the name and dose of the medication prescribed. In the event that your call is answered by a member of our staff, make sure to document and save the date, time, and the name of the person that took your information.  2. Exception #2 (Planned Surgery): In the event that you are scheduled by another doctor or dentist to have any type of surgery or procedure, you are allowed (for a period no longer than 30 days), to receive additional pain medication, for the acute post-op pain. However, in this case, you are responsible for picking up a copy of our "Post-op Pain Management for Surgeons" handout, and giving it to your surgeon or dentist. This document is available at our office, and does not require an appointment to obtain it. Simply go to our office during business hours (Monday-Thursday from 8:00 AM to 4:00 PM) (Friday 8:00 AM to 12:00 Noon) or if you have a scheduled appointment with [place], prior to your surgery, and ask for it by name. In addition, you will need to provide [place] with your name, name of your surgeon, type of surgery, and date of procedure or surgery.  *Opioid medications include: morphine, codeine, oxycodone, oxymorphone, hydrocodone, hydromorphone, meperidine, tramadol, tapentadol, buprenorphine, fentanyl, methadone. **Benzodiazepine medications include: diazepam (Valium), alprazolam (Xanax), clonazepam  (Klonopine), lorazepam (Ativan), clorazepate (Tranxene), chlordiazepoxide (Librium), estazolam (Prosom), oxazepam (Serax), temazepam (Restoril), triazolam (Halcion) (Last updated: 10/09/2017) ____________________________________________________________________________________________  BMI Assessment: Estimated body mass index is 35.99 kg/m as calculated from the following:   Height as of 07/13/18: 5\' 6"  (1.676 m).   Weight as of 07/13/18: 223 lb (101.2 kg).  BMI interpretation table: BMI level Category Range association with higher incidence of chronic pain  <18 kg/m2 Underweight   18.5-24.9 kg/m2 Ideal body weight   25-29.9 kg/m2 Overweight Increased incidence by 20%  30-34.9 kg/m2 Obese (Class I) Increased incidence by 68%  35-39.9 kg/m2 Severe obesity (Class II) Increased incidence by 136%  >40 kg/m2 Extreme obesity (Class III) Increased incidence by 254%   Patient's current BMI Ideal Body weight  There is no height or weight on file to calculate BMI. Patient weight not recorded   BMI Readings from Last 4 Encounters:  07/13/18 35.99 kg/m  06/17/18 35.51 kg/m  06/09/18 36.96 kg/m  04/22/18 35.51 kg/m   Wt Readings from Last 4 Encounters:  07/13/18 223 lb (101.2 kg)  06/17/18 220 lb (99.8 kg)  06/09/18 229 lb (103.9 kg)  04/22/18 220 lb (99.8 kg)

## 2018-09-07 NOTE — Progress Notes (Signed)
Nursing Pain Medication Assessment:  Safety precautions to be maintained throughout the outpatient stay will include: orient to surroundings, keep bed in low position, maintain call bell within reach at all times, provide assistance with transfer out of bed and ambulation.  Medication Inspection Compliance: Pill count conducted under aseptic conditions, in front of the patient. Neither the pills nor the bottle was removed from the patient's sight at any time. Once count was completed pills were immediately returned to the patient in their original bottle.  Medication: Morphine IR Pill/Patch Count: 29 of 60 pills remain Pill/Patch Appearance: Markings consistent with prescribed medication Bottle Appearance: Standard pharmacy container. Clearly labeled. Filled Date: 1 / 43 / 2020 Last Medication intake:  Today

## 2018-09-10 LAB — TOXASSURE SELECT 13 (MW), URINE

## 2018-09-23 ENCOUNTER — Telehealth: Payer: Self-pay | Admitting: Emergency Medicine

## 2018-09-23 NOTE — Telephone Encounter (Signed)
As stated in the telephone message:   If cough is not improving then please contact our office for a OV.    Pt needs office visit with Dr. Lamonte Sakai or APP for cough management. I will not refill the cough syrup at this time.   Aaron Edelman

## 2018-09-23 NOTE — Telephone Encounter (Signed)
Kari Rinne, NP  to [facility] Triage Pool       07/27/18 3:14 PM  Note    Okay to refill promethezine DM cough med. She may need to contact pain management to let them know this has been refilled. Inform patient to take protonix in the AM as listed below.    Protonix 40 mg tablet  >>>Please take 1 tablet daily 15 minutes to 30 minutes before your first meal of the day as well as before your other medications >>>Try to take at the same time each day >>>take this medication daily  GERD management: >>>Avoid laying flat until 2 hours after meals >>>Elevate head of the bed including entire chest >>>Reduce size of meals and amount of fat, acid, spices, caffeine and sweets >>>If you are smoking, Please stop! >>>Decrease alcohol consumption >>>Work on maintaining a healthy weight with normal BMI    If cough is not improving then please contact our office for a OV.   Kari Quaker FNP        ________________________________________________________________________________________  Kari Gibbs and spoke with patient, she stated that her dry cough is back and it is getting worse again. She is using the tessalon perles and the nasal spray but has no relief. Patient is requesting to have her promethazine cough syrup refilled. This was last refilled on 07/27/2018. Kari Gibbs please advise, thank you.

## 2018-09-24 ENCOUNTER — Telehealth: Payer: Self-pay | Admitting: Pulmonary Disease

## 2018-09-24 ENCOUNTER — Other Ambulatory Visit: Payer: Self-pay | Admitting: Pulmonary Disease

## 2018-09-24 ENCOUNTER — Ambulatory Visit: Payer: Medicare Other | Admitting: Pulmonary Disease

## 2018-09-24 ENCOUNTER — Encounter: Payer: Self-pay | Admitting: Pulmonary Disease

## 2018-09-24 VITALS — BP 130/70 | HR 87 | Ht 66.0 in | Wt 221.6 lb

## 2018-09-24 DIAGNOSIS — G894 Chronic pain syndrome: Secondary | ICD-10-CM

## 2018-09-24 DIAGNOSIS — K219 Gastro-esophageal reflux disease without esophagitis: Secondary | ICD-10-CM

## 2018-09-24 DIAGNOSIS — F1129 Opioid dependence with unspecified opioid-induced disorder: Secondary | ICD-10-CM

## 2018-09-24 DIAGNOSIS — J479 Bronchiectasis, uncomplicated: Secondary | ICD-10-CM | POA: Insufficient documentation

## 2018-09-24 DIAGNOSIS — R05 Cough: Secondary | ICD-10-CM

## 2018-09-24 DIAGNOSIS — R053 Chronic cough: Secondary | ICD-10-CM

## 2018-09-24 DIAGNOSIS — J309 Allergic rhinitis, unspecified: Secondary | ICD-10-CM

## 2018-09-24 MED ORDER — FLUTTER DEVI: 1.0000 | 0 refills | Status: DC | PRN

## 2018-09-24 MED ORDER — BENZONATATE 200 MG PO CAPS: 200.0000 mg | ORAL_CAPSULE | Freq: Four times a day (QID) | ORAL | 1 refills | Status: DC | PRN

## 2018-09-24 MED ORDER — PROMETHAZINE-DM 6.25-15 MG/5ML PO SYRP: 5.0000 mL | ORAL_SOLUTION | ORAL | 0 refills | Status: DC | PRN

## 2018-09-24 NOTE — Assessment & Plan Note (Signed)
Assessment: PPI twice daily Still having breakthrough GERD  Plan: Schedule follow-up with gastroenterology for further management of GERD Continue PPI twice daily

## 2018-09-24 NOTE — Progress Notes (Signed)
@Patient  ID: Kari Gibbs, female    DOB: January 30, 1951, 68 y.o.   MRN: [id number]  Chief Complaint  Patient presents with  . 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 Lamonte Sakai  09/24/2018  - Visit   68 year old female patient presenting today for evaluation of acute exacerbation of known chronic cough.  Patient reports the cough is worsened over the last 2 to 3 days.  Patient was asking for refill of her promethazine cough syrup as she reports that this does sometimes help with management of her cough.  Patient reports adherence to Singulair, daily antihistamine, nasal rinses, Astelin nasal spray, Protonix twice daily.  Patient does not have a flutter valve at home.  Patient is managed for chronic pain.  See cough ROS below   Tests:  06/25/2018-CT chest high-res-negative for interstitial lung disease, minimal air trapping, probable small left adrenal adenoma, bronchiectasis and volume loss in right middle lobe  FENO:  No results found for: NITRICOXIDE  PFT: No flowsheet data found.  Imaging: No results found.    Specialty Problems      Pulmonary Problems   Allergic rhinitis    Known dust mite allergy      Chronic cough   Bronchiectasis without complication ([place])    29/56/2130-QM chest high-res-negative for interstitial lung disease, minimal air trapping, probable small left adrenal adenoma, bronchiectasis and volume loss in right middle lobe         Allergies  Allergen Reactions  . Iohexol Anaphylaxis, Hives, Shortness Of Breath and Swelling    (IOHEXOL = Omnipaque) (Radiological Contrast) Code: HIVES, Desc: Pt has had a prior IVP dye reaction,(no scans in  health system).Symptoms were hives,and airway obstruction!, Onset Date: 09/29/1999   . Ace Inhibitors     REACTION: cyclic cough  . Losartan Potassium Other (See Comments)    Raises blood pressure; fatigue  . Neosporin [Neomycin-Bacitracin Zn-Polymyx] Dermatitis    Immunization History  Administered Date(s) Administered  . Hepatitis B 04/12/2009  . Influenza, High Dose Seasonal PF 05/12/2018  . Influenza-Unspecified 05/12/2013, 05/13/2015  . Pneumococcal-Unspecified 05/12/2013  . Tdap 12/11/2007, 07/01/2017    Past Medical History:  Diagnosis Date  . Abdominal pain   . Anxiety   . Arthritis    OA LOWER SPINE  . Depression    MOSTLY IN THE FALL OF THE YEAR  . 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 Fatigue   . Gastritis    Distal gastritis  . GERD (gastroesophageal reflux disease)    "told that she does"  . Headache(784.0)    HX OF MIGRAINES  . Hiatal hernia 09/2009  . History of kidney stones   . History of rib fracture (Left 4th and 8th rib) 06/28/2015  . Hypertension   . IBS (irritable bowel syndrome)   . Nausea & vomiting   . OSTEOARTHRITIS 05/01/2010   Qualifier: Diagnosis of  By: Megan Salon MD, John    . Pneumonia    WHEN PT IN 6 TH GRADE  . SBO (small bowel obstruction) ([place]) 2013  . Varicose veins     Tobacco History: Social History   Tobacco Use  Smoking Status Never Smoker  Smokeless Tobacco Never Used  Counseling given: Yes  Continue to not smoke   Outpatient Encounter Medications as of 09/24/2018  Medication Sig  . azelastine (ASTELIN) 0.1 % nasal spray Place 1 spray into both nostrils daily.  . benzonatate (TESSALON) 200 MG capsule Take 1 capsule (200 mg total) by mouth every 6 (six) hours as needed for cough.  . busPIRone (BUSPAR) 15 MG tablet TAKE 1/3 TO 1/2 TAB AS NEEDED FOR ANXIETY UP TO TWICE A DAY  . Cholecalciferol (VITAMIN D3) LIQD Take 3 drops by mouth daily.   Marland Kitchen conjugated estrogens (PREMARIN) vaginal  cream Premarin 0.625 mg/gram vaginal cream  INSERT 1GRAM VAGINALLY 3 TIMES WEEKLY AT BEDTIME  . diclofenac sodium (VOLTAREN) 1 % GEL Apply 1 application topically as needed.  . gabapentin (NEURONTIN) 300 MG capsule Take 1 capsule (300 mg total) by mouth 2 (two) times daily. Taking 2 in the morning  . irbesartan (AVAPRO) 75 MG tablet Take 75 mg by mouth daily.  . Magnesium 500 MG TABS Take 1 tablet by mouth 2 (two) times daily.  . montelukast (SINGULAIR) 10 MG tablet Take 1 tablet (10 mg total) by mouth at bedtime.  Derrill Memo ON 11/21/2018] morphine (MS CONTIN) 30 MG 12 hr tablet Take 1 tablet (30 mg total) by mouth every 12 (twelve) hours for 30 days.  . naloxone (NARCAN) nasal spray 4 mg/0.1 mL Spray into one nostril. Repeat with second device into other nostril after 2-3 minutes if no or minimal response. Use in case of opioid overdose.  . pantoprazole (PROTONIX) 40 MG tablet Take 1 tablet (40 mg total) by mouth daily. (Patient taking differently: Take 40 mg by mouth 2 (two) times daily. )  . PRENATAL VIT-FE FUM-FA-OMEGA PO Take 1 tablet by mouth daily.  . Probiotic Product (ALIGN) 4 MG CAPS Take 1 capsule by mouth at bedtime. Per Pt.  . promethazine-dextromethorphan (PROMETHAZINE-DM) 6.25-15 MG/5ML syrup Take 5 mLs by mouth as needed.  . vitamin B-12 (CYANOCOBALAMIN) 500 MCG tablet Take 500 mcg by mouth 3 (three) times a week.  . Wheat Dextrin (BENEFIBER) POWD Stir 2 teaspoons of Benefiber into 4-8 oz of any non-carbonated beverage or soft food (hot or cold) TID.  . [DISCONTINUED] benzonatate (TESSALON) 200 MG capsule Take 1 capsule (200 mg total) by mouth every 6 (six) hours as needed for cough.  . [DISCONTINUED] promethazine-dextromethorphan (PROMETHAZINE-DM) 6.25-15 MG/5ML syrup Take 5 mLs by mouth as needed.  Marland Kitchen Respiratory Therapy Supplies (FLUTTER) DEVI 1 Device by Does not apply route as needed.  . [DISCONTINUED] morphine (MS CONTIN) 30 MG 12 hr tablet Take 1 tablet (30 mg total) by mouth  every 12 (twelve) hours for 30 days.  . [DISCONTINUED] morphine (MS CONTIN) 30 MG 12 hr tablet Take 1 tablet (30 mg total) by mouth every 12 (twelve) hours for 30 days.   No facility-administered encounter medications on file as of 09/24/2018.      Review of Systems  Review of Systems  Constitutional: Positive for fatigue. Negative for chills, fever and unexpected weight change.  HENT: Positive for congestion. Negative for ear pain and postnasal drip.   Respiratory: Positive for cough. Negative for chest tightness, shortness of breath and wheezing.   Cardiovascular: Positive for leg swelling. Negative for chest pain and palpitations.  Gastrointestinal: Negative for diarrhea, nausea and vomiting.       Multiple times a week breakthrough acid reflux despite PPI therapy  Musculoskeletal: Positive for arthralgias.  Skin: Negative for color change.  Allergic/Immunologic: Negative for environmental allergies and food  allergies.  Neurological: Negative for dizziness, light-headedness and headaches.  Psychiatric/Behavioral: Negative for dysphoric mood. The patient is not nervous/anxious.   All other systems reviewed and are negative.    Cough ROS:   When to the symptoms start: Fall / 2019  How are you today: worse over past 2 days   Have you had fever/sore throat (first 5 to 7 days of URI) or Have you had cough/nasal congestion (10 to 14 days of URI) : dry cough Have you used anything to treat the cough, as anything improved : cough syrup, vaporizer, humidifier  Is it a dry or wet cough: dry cough  Does the cough happen when your breathing or when you breathe out:  out Other any triggers to your cough, or any aggravating factors: rain / dampness, worse when sitting, laying down, dust mites  Daily antihistamine: claritin  GERD treatment: protonix bid  Singulair: yes   Cough checklist (bolded indicates presence):  Adherence, acid reflux, ACE inhibitor, active sinus disease, active  smoking, adverse effects of medications (amiodarone/Macrodantin/bb), alpha 1, allergies, aspiration, anxiety, bronchiectasis, congestive heart failure (diastolic)     Physical Exam  BP 130/70 (BP Location: Left Arm, Cuff Size: Normal)   Pulse 87   Ht 5\' 6"  (1.676 m)   Wt 221 lb 9.6 oz (100.5 kg)   SpO2 97%   BMI 35.77 kg/m   Wt Readings from Last 5 Encounters:  09/24/18 221 lb 9.6 oz (100.5 kg)  09/07/18 220 lb (99.8 kg)  07/13/18 223 lb (101.2 kg)  06/17/18 220 lb (99.8 kg)  06/09/18 229 lb (103.9 kg)    Physical Exam  Constitutional: She is oriented to person, place, and time and well-developed, well-nourished, and in no distress. Vital signs are normal. She has a sickly appearance. No distress.  + Chronically ill female  HENT:  Head: Normocephalic and atraumatic.  Right Ear: Hearing, tympanic membrane, external ear and ear canal normal.  Left Ear: Hearing, tympanic membrane, external ear and ear canal normal.  Nose: Mucosal edema and rhinorrhea present. Right sinus exhibits no maxillary sinus tenderness and no frontal sinus tenderness. Left sinus exhibits no maxillary sinus tenderness and no frontal sinus tenderness.  Mouth/Throat: Uvula is midline and oropharynx is clear and moist.  postnasal drip  Eyes: Pupils are equal, round, and reactive to light.  Neck: Normal range of motion. Neck supple.  Cardiovascular: Normal rate, regular rhythm and normal heart sounds.  Pulmonary/Chest: Effort normal and breath sounds normal. No accessory muscle usage. No respiratory distress. She has no decreased breath sounds. She has no wheezes. She has no rhonchi. She has no rales.  Abdominal:   truncal obesity  Musculoskeletal: Normal range of motion.        General: No edema.  Lymphadenopathy:    She has no cervical adenopathy.  Neurological: She is alert and oriented to person, place, and time. Gait normal.  Skin: Skin is warm and dry. She is not diaphoretic. No erythema.    Psychiatric: Mood, memory, affect and judgment normal.  Nursing note and vitals reviewed.     Lab Results:  CBC    Component Value Date/Time   WBC 6.1 11/18/2014 1705   RBC 3.95 11/18/2014 1705   HGB 11.1 (L) 11/18/2014 1705   HCT 34.1 (L) 11/18/2014 1705   PLT 238 11/18/2014 1705   MCV 86.3 11/18/2014 1705   MCH 28.1 11/18/2014 1705   MCHC 32.6 11/18/2014 1705   RDW 14.2 11/18/2014 1705   LYMPHSABS 1.9 11/18/2014  1705   MONOABS 0.8 11/18/2014 1705   EOSABS 0.1 11/18/2014 1705   BASOSABS 0.0 11/18/2014 1705    BMET    Component Value Date/Time   NA 139 01/04/2016 1653   NA 138 01/13/2014 1648   K 4.6 01/04/2016 1653   K 3.6 01/13/2014 1648   CL 99 (L) 01/04/2016 1653   CL 104 01/13/2014 1648   CO2 33 (H) 01/04/2016 1653   CO2 31 01/13/2014 1648   GLUCOSE 96 01/04/2016 1653   GLUCOSE 97 01/13/2014 1648   BUN 20 01/04/2016 1653   BUN 16 01/13/2014 1648   CREATININE 0.76 01/04/2016 1653   CREATININE 0.87 01/13/2014 1648   CALCIUM 9.7 01/04/2016 1653   CALCIUM 9.5 01/13/2014 1648   GFRNONAA >60 01/04/2016 1653   GFRNONAA >60 01/13/2014 1648   GFRAA >60 01/04/2016 1653   GFRAA >60 01/13/2014 1648    BNP No results found for: BNP  ProBNP No results found for: PROBNP    Assessment & Plan:   Chronic cough Assessment: Mild AR flare today Known bronchiectasis on November/2019 CT Known GERD  Plan: Start flutter valve use Continue daily Claritin Can add chlor tabs at night for management of postnasal drip Continue Claritin daily Continue nasal saline rinses Continue Astelin nasal spray Continue PPI as prescribed Refilled Tessalon Perles Refilled promethazine cough syrup Checked PMP aware Schedule follow-up with gastroenterology Follow-up with our office in 3 months  Opiate dependence ([place]) Continue follow-up with pain management  Chronic pain syndrome Continue follow-up with pain management  Morbid obesity ([place]) Assessment: BMI  35.7  Plan: Continue to work on [employer] and weight Work to reduce your BMI Schedule follow-up for gastroenterology  Allergic rhinitis Assessment: AR flare today  Plan: Continue Claritin daily Continue Singulair daily Continue nasal saline rinses 1-2 times a day Can add and start chlor tabs at night for management of postnasal drip Continue Astelin nasal spray Continue lifestyle measures for management of dust mite allergy In case mattress as well as pillowcases Dry pillows once a week and dryer Continue to use air filtration system in the house  GERD Assessment: PPI twice daily Still having breakthrough GERD  Plan: Schedule follow-up with gastroenterology for further management of GERD Continue PPI twice daily  Bronchiectasis without complication ([place]) Assessment: Known bronchiectasis and right middle lobe Likely due to pneumonia as a child or chronic sinusitis  Plan: Start flutter valve use Reviewed bronchiectasis with patient today   Before prescribing the patient with promethazine cough syrup, I have checked [place] PMP aware and the patients overdose risk score is 470. Patient has 5 providers prescribing controlled substances. Patient has used 4 pharmacies. I have counseled the patient on the sedative effects of promethazine cough syrup as well as chlorphentermine. Patient to use this medication sparingly and not when driving, drinking alcohol, or using additional sedative medications. Patient has been prescribed 166ml with no refills.     Lauraine Rinne, NP 09/24/2018   This appointment was 42 min long with over 50% of the time in direct face-to-face patient care, assessment, plan of care, and follow-up.

## 2018-09-24 NOTE — Assessment & Plan Note (Signed)
Assessment: Mild AR flare today Known bronchiectasis on November/2019 CT Known GERD  Plan: Start flutter valve use Continue daily Claritin Can add chlor tabs at night for management of postnasal drip Continue Claritin daily Continue nasal saline rinses Continue Astelin nasal spray Continue PPI as prescribed Refilled Tessalon Perles Refilled promethazine cough syrup Checked PMP aware Schedule follow-up with gastroenterology Follow-up with our office in 3 months

## 2018-09-24 NOTE — Assessment & Plan Note (Signed)
Continue follow-up with pain management.

## 2018-09-24 NOTE — Telephone Encounter (Signed)
Called and spoke with patient, she is aware of response and verbalized understanding. Appointment has been made. Nothing further needed.

## 2018-09-24 NOTE — Assessment & Plan Note (Signed)
Assessment: BMI 35.7  Plan: Continue to work on healthy diet and weight Work to reduce your BMI Schedule follow-up for gastroenterology

## 2018-09-24 NOTE — Assessment & Plan Note (Addendum)
Assessment: AR flare today  Plan: Continue Claritin daily Continue Singulair daily Continue nasal saline rinses 1-2 times a day Can add and start chlor tabs at night for management of postnasal drip Continue Astelin nasal spray Continue lifestyle measures for management of dust mite allergy In case mattress as well as pillowcases Dry pillows once a week and dryer Continue to use air filtration system in the house

## 2018-09-24 NOTE — Progress Notes (Signed)
Patient seen in the office today and instructed on use of flutter valve.  Patient expressed understanding and demonstrated technique.  

## 2018-09-24 NOTE — Telephone Encounter (Signed)
Called pharmacy, who verified that promethazine cough syrup is on backorder.  The alternative in stock that was suggested was Sudafed cough syrup.    Spoke with Kari Gibbs to see what he recommends.  Per Kari Gibbs, pt needs to reach out to pain mgmt to see what alternatives they can recommend.  lmtcb for pt to make aware of recs.

## 2018-09-24 NOTE — Patient Instructions (Addendum)
Follow up with Gastroenterology ASAP   Cough Home Instructions:  We believe you have a chronic/cyclical cough that is aggravated by reflux , coughing , and drainage.  . Goal is to not Cough or clear throat.  Marland Kitchen Avoid coughing or clearing throat by using:  o non-mint products/sugarless candy o Water o ice chips o Remember NO MINT PRODUCTS  . Medications to use:  o Mucinex DM 1-2 every 12 hrs or Delsym 2 tsp every 12 hrs for cough (These are Over the counter) o Tessalon Three times a day  As needed  Cough.  o Protonix 40 mg 30 min before breakfast and dinner.  o Continue Claritin daily  o Singulair at bedtime (Can use generic, this is over the counter) o Chlor tabs 4mg  2 at bedtime  for nasal drip until cough is 100% cough free. (this medication is over the counter) - Run this by pain management  - This medication is sedating    I have refilled your promethazine cough syrup >>> Make sure pain management is aware of this >>> This medication can be sedating  Bronchiectasis: This is the medical term which indicates that you have damage, dilated airways making you more susceptible to respiratory infection. Use a flutter valve 10 breaths twice a day or 4 to 5 breaths 4-5 times a day to help clear mucus out Let us know if you have cough with change in mucus color or fevers or chills.  At that point you would need an antibiotic. Maintain a healthy nutritious diet, eating whole foods Take your medications as prescribed     Follow up with Dr. Lamonte Sakai in 3 months      It is flu season:   >>>Remember to be washing your hands regularly, using hand sanitizer, be careful to use around herself with has contact with people who are sick will increase her chances of getting sick yourself. >>> Best ways to protect herself from the flu: Receive the yearly flu vaccine, practice good hand hygiene washing with soap and also using hand sanitizer when available, eat a nutritious meals, get adequate rest,  hydrate appropriately   Please contact the office if your symptoms worsen or you have concerns that you are not improving.   Thank you for choosing [facility] for your healthcare, and for allowing [place] to partner with you on your healthcare journey. I am thankful to be able to provide care to you today.   Wyn Quaker FNP-C

## 2018-09-24 NOTE — Assessment & Plan Note (Signed)
Assessment: Known bronchiectasis and right middle lobe Likely due to pneumonia as a child or chronic sinusitis  Plan: Start flutter valve use Reviewed bronchiectasis with patient today

## 2018-09-25 NOTE — Telephone Encounter (Signed)
Patient returned call, CB is [phone]

## 2018-09-25 NOTE — Telephone Encounter (Signed)
ATC pt, no answer. Left message for pt to call back.  

## 2018-09-25 NOTE — Telephone Encounter (Signed)
Called pt and advised message from the provider. Pt understood and verbalized understanding. Nothing further is needed.    

## 2018-09-28 NOTE — Telephone Encounter (Signed)
Unsure why this request is being sent to me. This was previously addressed last week.  Note listed below:   Called pharmacy, who verified that promethazine cough syrup is on backorder.  The alternative in stock that was suggested was Sudafed cough syrup.    Spoke with Aaron Edelman to see what he recommends.  Per Aaron Edelman, pt needs to reach out to pain mgmt to see what alternatives they can recommend.    Patient needs to contact pain management to discuss what alternatives the patient can be prescribed.  Or patient can try going to a different pharmacy for the promethazine cough syrup.  Wyn Quaker, FNP

## 2018-09-28 NOTE — Telephone Encounter (Signed)
Aaron Edelman, please advise if it is okay to refill med for pt. Thanks!

## 2018-10-15 ENCOUNTER — Telehealth: Payer: Self-pay

## 2018-10-15 ENCOUNTER — Telehealth: Payer: Self-pay | Admitting: Emergency Medicine

## 2018-10-15 ENCOUNTER — Telehealth: Payer: Self-pay | Admitting: Nurse Practitioner

## 2018-10-15 DIAGNOSIS — R053 Chronic cough: Secondary | ICD-10-CM

## 2018-10-15 DIAGNOSIS — R05 Cough: Secondary | ICD-10-CM

## 2018-10-15 MED ORDER — PROMETHAZINE-DM 6.25-15 MG/5ML PO SYRP: 5.0000 mL | ORAL_SOLUTION | ORAL | 0 refills | Status: DC | PRN

## 2018-10-15 NOTE — Telephone Encounter (Signed)
Spoke with pt, she she is requesting a RX for promethazine with codeine cough syrup. She states her pain management is supposed to be calling us to let us know that it is ok to dispense this medication. She is having a dry cough and GERD episode but there is no mucus production. It looks like Aaron Edelman saw her last and dispensed a Rx on 09/23/2018.   Pain Management doctor [facility] Dr. Dionisio David [phone]  I called the doctor and left a message for them to call us back to give permission to dispense the medication. If we get the ok,  Is it ok to dispense? Sending to DOD since [facility] is not available.   Assessment & Plan Note by Lauraine Rinne, NP at 09/24/2018 1:22 PM  Author: Lauraine Rinne, NP Author Type: Nurse Practitioner Filed: 09/24/2018 1:24 PM  Note Status: Written Cosign: Cosign Not Required Encounter Date: 09/24/2018  Problem: Chronic cough  Editor: Lauraine Rinne, NP (Nurse Practitioner)    Assessment: Mild AR flare today Known bronchiectasis on November/2019 CT Known GERD  Plan: Start flutter valve use Continue daily Claritin Can add chlor tabs at night for management of postnasal drip Continue Claritin daily Continue nasal saline rinses Continue Astelin nasal spray Continue PPI as prescribed Refilled Tessalon Perles Refilled promethazine cough syrup Checked PMP aware Schedule follow-up with gastroenterology Follow-up with our office in 3 months

## 2018-10-15 NOTE — Telephone Encounter (Signed)
Yes okay to send in refill.   Wyn Quaker FNP

## 2018-10-15 NOTE — Telephone Encounter (Signed)
Cough medication is WITHOUT CODEINE per [place].

## 2018-10-15 NOTE — Telephone Encounter (Signed)
Called [employer] NP's office spoke with nurse. Wyn Quaker NP said to refill what he Rx'd before. Medication does not have codeine.  Medication refilled and sent to pharmacy of patient's preference. Called and spoke with patient letting her know it did not have codeine in it but was the same medication Aaron Edelman had called in for her at her last OV. Patient verbalized understanding.  Nothing further needed at this time.

## 2018-10-15 NOTE — Telephone Encounter (Signed)
Pt called and stated that her pulmonary doctor wants to prescribe promethazine for her chronic cough but needs permission first so she doesn't break her contract with [place] here. The doctors name is Dr. Herbie Baltimore Dyrum [phone]

## 2018-10-15 NOTE — Telephone Encounter (Signed)
[facility] called, they are seeing Kari Gibbs there, and want to know if they can give her the cough medicine with codeine in it. She told them because she is a patient here that they had to get permission to give it to her.

## 2018-10-22 ENCOUNTER — Telehealth: Payer: Self-pay | Admitting: Emergency Medicine

## 2018-10-22 DIAGNOSIS — R053 Chronic cough: Secondary | ICD-10-CM

## 2018-10-22 DIAGNOSIS — R05 Cough: Secondary | ICD-10-CM

## 2018-10-22 MED ORDER — PROMETHAZINE-DM 6.25-15 MG/5ML PO SYRP: 5.0000 mL | ORAL_SOLUTION | ORAL | 0 refills | Status: DC | PRN

## 2018-10-22 NOTE — Telephone Encounter (Signed)
I have added the pharmacy in pt's list that she said carried the promethazine syrup. Pharmacy is [facility] off of [address].  Aaron Edelman, please advise if you are able to send the Rx there for pt. Thanks!

## 2018-10-22 NOTE — Telephone Encounter (Signed)
I was able to send the Rx to the pharmacy for pt.  Called and spoke with pt letting her know this had been done. Pt expressed understanding. Nothing further needed.

## 2018-10-22 NOTE — Telephone Encounter (Signed)
Pt said that she found the promethazine syrup at [facility] number [phone]

## 2018-10-22 NOTE — Telephone Encounter (Signed)
Okay to refill another 90 mL's at that pharmacy.  Wyn Quaker, FNP

## 2018-10-22 NOTE — Telephone Encounter (Signed)
This is not a narcotic you can send this over.  Wyn Quaker FNP

## 2018-10-22 NOTE — Telephone Encounter (Signed)
Called and spoke with pt who stated she was only able to get 74ml of the promethazine cough med due to the pharmacy not having enough to fully fill the med to the amount that was prescribed by Aaron Edelman.  I advised pt to call around to different pharmacies to see if she could find one that had Promethazine Syrup and then call us back with that pharmacy and we would send this over to Wyn Quaker, NP to let him know what has happened with the med to see if he would be okay to write another Rx to the different pharmacy.  Pt expressed understanding and stated she would call us back when able. Will leave encounter open.

## 2018-10-22 NOTE — Telephone Encounter (Signed)
Kari Gibbs - please advise. Thanks. 

## 2018-11-15 ENCOUNTER — Other Ambulatory Visit: Payer: Self-pay | Admitting: Nurse Practitioner

## 2018-11-15 DIAGNOSIS — M792 Neuralgia and neuritis, unspecified: Secondary | ICD-10-CM

## 2018-11-16 ENCOUNTER — Telehealth: Payer: Self-pay | Admitting: Pulmonary Disease

## 2018-11-16 NOTE — Telephone Encounter (Signed)
Opened in error.  Pt to call pharmacy.  Nothing needed.

## 2018-12-07 ENCOUNTER — Other Ambulatory Visit: Payer: Self-pay

## 2018-12-07 ENCOUNTER — Ambulatory Visit: Payer: Medicare Other | Attending: Nurse Practitioner | Admitting: Nurse Practitioner

## 2018-12-07 DIAGNOSIS — M792 Neuralgia and neuritis, unspecified: Secondary | ICD-10-CM | POA: Diagnosis not present

## 2018-12-07 DIAGNOSIS — M5414 Radiculopathy, thoracic region: Secondary | ICD-10-CM

## 2018-12-07 DIAGNOSIS — R1012 Left upper quadrant pain: Secondary | ICD-10-CM

## 2018-12-07 DIAGNOSIS — R1011 Right upper quadrant pain: Secondary | ICD-10-CM

## 2018-12-07 DIAGNOSIS — G894 Chronic pain syndrome: Secondary | ICD-10-CM

## 2018-12-07 DIAGNOSIS — G8929 Other chronic pain: Secondary | ICD-10-CM

## 2018-12-07 MED ORDER — MORPHINE SULFATE ER 30 MG PO TBCR: 30.0000 mg | EXTENDED_RELEASE_TABLET | Freq: Two times a day (BID) | ORAL | 0 refills | Status: DC

## 2018-12-07 MED ORDER — GABAPENTIN 300 MG PO CAPS: 300.0000 mg | ORAL_CAPSULE | Freq: Two times a day (BID) | ORAL | 0 refills | Status: DC

## 2018-12-07 NOTE — Patient Instructions (Signed)
____________________________________________________________________________________________  Medication Rules  Purpose: To inform patients, and their family members, of our rules and regulations.  Applies to: All patients receiving prescriptions (written or electronic).  Pharmacy of record: Pharmacy where electronic prescriptions will be sent. If written prescriptions are taken to a different pharmacy, please inform the nursing staff. The pharmacy listed in the electronic medical record should be the one where you would like electronic prescriptions to be sent.  Electronic prescriptions: In compliance with the [place] Strengthen Opioid Misuse Prevention (STOP) Act of 2017 (Session Law 2017-74/H243), effective August 12, 2018, all controlled substances must be electronically prescribed. Calling prescriptions to the pharmacy will cease to exist.  Prescription refills: Only during scheduled appointments. Applies to all prescriptions.  NOTE: The following applies primarily to controlled substances (Opioid* Pain Medications).   Patient's responsibilities: 1. Pain Pills: Bring all pain pills to every appointment (except for procedure appointments). 2. Pill Bottles: Bring pills in original pharmacy bottle. Always bring the newest bottle. Bring bottle, even if empty. 3. Medication refills: You are responsible for knowing and keeping track of what medications you take and those you need refilled. The day before your appointment: write a list of all prescriptions that need to be refilled. The day of the appointment: give the list to the admitting nurse. Prescriptions will be written only during appointments. No prescriptions will be written on procedure days. If you forget a medication: it will not be "Called in", "Faxed", or "electronically sent". You will need to get another appointment to get these prescribed. No early refills. Do not call asking to have your prescription filled  early. 4. Prescription Accuracy: You are responsible for carefully inspecting your prescriptions before leaving our office. Have the discharge nurse carefully go over each prescription with you, before taking them home. Make sure that your name is accurately spelled, that your address is correct. Check the name and dose of your medication to make sure it is accurate. Check the number of pills, and the written instructions to make sure they are clear and accurate. Make sure that you are given enough medication to last until your next medication refill appointment. 5. Taking Medication: Take medication as prescribed. When it comes to controlled substances, taking less pills or less frequently than prescribed is permitted and encouraged. Never take more pills than instructed. Never take medication more frequently than prescribed.  6. Inform other Doctors: Always inform, all of your healthcare providers, of all the medications you take. 7. Pain Medication from other Providers: You are not allowed to accept any additional pain medication from any other Doctor or Healthcare provider. There are two exceptions to this rule. (see below) In the event that you require additional pain medication, you are responsible for notifying us, as stated below. 8. Medication Agreement: You are responsible for carefully reading and following our Medication Agreement. This must be signed before receiving any prescriptions from our practice. Safely store a copy of your signed Agreement. Violations to the Agreement will result in no further prescriptions. (Additional copies of our Medication Agreement are available upon request.) 9. Laws, Rules, & Regulations: All patients are expected to follow all Federal and State Laws, Statutes, Rules, & Regulations. Ignorance of the Laws does not constitute a valid excuse. The use of any illegal substances is prohibited. 10. Adopted CDC guidelines & recommendations: Target dosing levels will be  at or below 60 MME/day. Use of benzodiazepines** is not recommended.  Exceptions: There are only two exceptions to the rule of not   receiving pain medications from other Healthcare Providers. 1. Exception #1 (Emergencies): In the event of an emergency (i.e.: accident requiring emergency care), you are allowed to receive additional pain medication. However, you are responsible for: As soon as you are able, call our office [phone], at any time of the day or night, and leave a message stating your name, the date and nature of the emergency, and the name and dose of the medication prescribed. In the event that your call is answered by a member of our staff, make sure to document and save the date, time, and the name of the person that took your information.  2. Exception #2 (Planned Surgery): In the event that you are scheduled by another doctor or dentist to have any type of surgery or procedure, you are allowed (for a period no longer than 30 days), to receive additional pain medication, for the acute post-op pain. However, in this case, you are responsible for picking up a copy of our "Post-op Pain Management for Surgeons" handout, and giving it to your surgeon or dentist. This document is available at our office, and does not require an appointment to obtain it. Simply go to our office during business hours (Monday-Thursday from 8:00 AM to 4:00 PM) (Friday 8:00 AM to 12:00 Noon) or if you have a scheduled appointment with us, prior to your surgery, and ask for it by name. In addition, you will need to provide us with your name, name of your surgeon, type of surgery, and date of procedure or surgery.  *Opioid medications include: morphine, codeine, oxycodone, oxymorphone, hydrocodone, hydromorphone, meperidine, tramadol, tapentadol, buprenorphine, fentanyl, methadone. **Benzodiazepine medications include: diazepam (Valium), alprazolam (Xanax), clonazepam (Klonopine), lorazepam (Ativan), clorazepate  (Tranxene), chlordiazepoxide (Librium), estazolam (Prosom), oxazepam (Serax), temazepam (Restoril), triazolam (Halcion) (Last updated: 10/09/2017) ____________________________________________________________________________________________    

## 2018-12-07 NOTE — Progress Notes (Signed)
Pain Management Encounter Note - Virtual Visit via Telephone Telehealth (real-time audio visits between healthcare provider and patient).  Patient's Phone No. & Preferred Pharmacy:  [phone] (home); [phone] (mobile); (Preferred) [place], [address] [facility] [address] [place] [address] Phone: [phone] Fax: [phone]  [id number] [address], [address] [place] [address] Phone: [phone] Fax: [place], [address] [place] [address] Phone: [phone] Fax: [phone]   Pre-screening note:  Our staff contacted Kari Gibbs and offered her an "in person", "face-to-face" appointment versus a telephone encounter. She indicated preferring the telephone encounter, at this time.  Reason for Virtual Visit: COVID-19*  Social distancing based on CDC and AMA recommendations.   I contacted Kari Gibbs on 12/07/2018 at 10:16 AM by telephone and clearly identified myself as Dionisio David, NP. I verified that I was speaking with the correct person using two identifiers (Name and date of birth: 1951/02/02).  Advanced Informed Consent I sought verbal advanced consent from Kari Gibbs for telemedicine interactions and virtual visit. I informed Kari Gibbs of the security and privacy concerns, risks, and limitations associated with performing an evaluation and management service by telephone. I also informed Kari Gibbs of the availability of "in person" appointments and I informed her of the possibility of a patient responsible charge related to this service. Kari Gibbs expressed understanding and agreed to proceed.   Historic Elements   Kari Gibbs is a 68 y.o. year old, female patient evaluated today  after her last encounter by our practice on 11/15/2018. 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 Kari Gibbs has a current medication list which includes the following prescription(s): azelastine, benzonatate, buspirone, vitamin d3, conjugated estrogens, diclofenac sodium, gabapentin, irbesartan, magnesium, montelukast, morphine, morphine, morphine, naloxone, pantoprazole, prenatal vit-fe fum-fa-omega, align, promethazine-dextromethorphan, flutter, venlafaxine xr, vitamin b-12, and benefiber. She  reports that she has never smoked. She has never used smokeless tobacco. She reports that she does not drink alcohol or use drugs. Kari Gibbs is allergic to iohexol; ace inhibitors; losartan potassium; and neosporin [neomycin-bacitracin zn-polymyx].   HPI  I last saw her on 11/15/2018. She is being evaluated for medication management. She has 3/10. She has left abdominal pain. She denies any new pain related concerns today. She denies any side effects of her medication. She is taking the Gabapentin 300mg  BID . She feels like this has level everything out.   Pharmacotherapy Assessment   Analgesic:MS Contin 30 mg every 12 hours (60 mg/day) MME/day:60 mg/day Monitoring: Pharmacotherapy: No side-effects or adverse reactions reported. [place] PMP: PDMP reviewed during this encounter.       Compliance: No problems  identified. Plan: Refer to "POC".  Review of recent tests  CT Chest High Resolution CLINICAL DATA:  One-month history of cough. Evaluate for interstitial lung disease.  EXAM: CT CHEST WITHOUT CONTRAST  TECHNIQUE: Multidetector CT imaging of the chest was performed following the standard protocol without intravenous contrast. High resolution imaging of the lungs, as well as inspiratory and expiratory imaging, was performed.  COMPARISON:  Chest radiograph 06/03/2018.  Chest CT 11/24/2013.  FINDINGS: Cardiovascular: Atherosclerotic calcification of the arterial vasculature. Heart is at the upper limits of normal in size. No pericardial effusion.  Mediastinum/Nodes: Mediastinal lymph nodes are not enlarged by CT size criteria. Hilar regions are difficult to evaluate without IV contrast. No axillary adenopathy. Esophagus is grossly unremarkable.  Lungs/Pleura: Linear scarring, bronchiectasis and volume loss in the right middle lobe, lingula and both lower lobes. Negative for subpleural reticulation, traction bronchiectasis/bronchiolectasis, ground-glass, architectural distortion or honeycombing. Minimal air trapping. No pleural fluid. Airway is unremarkable.  Upper Abdomen: Visualized portion of the liver is unremarkable. Common bile duct dilatation may be related to cholecystectomy. Right adrenal gland is unremarkable. 11 mm left adrenal nodule measures 21 Hounsfield units and is likely unchanged. Tiny stone in the left kidney. Visualized portions of the spleen and pancreas are grossly unremarkable. Gastric bypass. No upper abdominal adenopathy.  Musculoskeletal: No worrisome lytic or sclerotic lesions.  IMPRESSION: 1. Negative for interstitial lung  disease. 2. Minimal air trapping, indicative of small airways disease. 3. Probable small left adrenal adenoma. 4. Tiny left renal stone. 5. Aortic atherosclerosis (ICD10-170.0).  Electronically Signed   By: Lorin Picket M.D.   On: 06/25/2018 08:30   Clinical Support on 09/07/2018  Component Date Value Ref Range Status  . Summary 09/07/2018 FINAL   Final   Comment: ==================================================================== TOXASSURE SELECT 13 (MW) ==================================================================== Test                             Result       Flag       Units Drug Present and Declared for Prescription Verification   Morphine                       >7519        EXPECTED   ng/mg creat   Normorphine                    1265         EXPECTED   ng/mg creat    Potential sources of large amounts of morphine in the absence of    codeine include administration of morphine or use of heroin.    Normorphine is an expected metabolite of morphine.   Hydromorphone                  347          EXPECTED   ng/mg creat    Hydromorphone may be present as a metabolite of morphine;    concentrations of hydromorphone rarely exceed 5% of the morphine    concentration when this is the source of hydromorphone. ==================================================================== Test                      Result    Flag   Units  Ref Range   Creatinine              133              mg/dL      >=20 ==================================================================== Declared Medications:  The flagging and interpretation on this report are based on the  following declared medications.  Unexpected results may arise from  inaccuracies in the declared medications.  **Note: The testing scope of this panel includes these medications:  Morphine (MS Contin)  **Note: The testing scope of this panel does not include following  reported medications:   Azelastine (Astelin)  Benzonatate (Tessalon)  Buspirone (BuSpar)  Cyanocobalamin  Dextromethorphan (Prometh with Dextromethorphan)  Estrogen (Premarin)  Gabapentin  Irbesartan (Avapro)  Magnesium  Montelukast (Singulair)  Multivitamin (Prenatal Vitamin)  Naloxone (Narcan)  Pantoprazole (Protonix)  Promethazine (Prometh with Dextromethorphan)  Supplement  Supplement (Probiotic)  Topical Diclofenac  Vitamin D3 =======================================                          ============================= For clinical consultation, please call [phone]. ====================================================================    Assessment  The primary encounter diagnosis was Neurogenic pain. A diagnosis of Chronic pain syndrome was also pertinent to this visit.  Plan of Care  I am having Kari Gibbs maintain her Align, PRENATAL VIT-FE FUM-FA-OMEGA PO, Magnesium, conjugated estrogens, busPIRone, vitamin B-12, Vitamin D3, naloxone, irbesartan, azelastine, diclofenac sodium, Benefiber, pantoprazole, montelukast, benzonatate, Flutter, promethazine-dextromethorphan, venlafaxine XR, gabapentin, morphine, morphine, and morphine.  Pharmacotherapy (Medications Ordered): Meds ordered this encounter  Medications  . gabapentin (NEURONTIN) 300 MG capsule    Sig: Take 1 capsule (300 mg total) by mouth 2 (two) times daily. Taking 2 in the morning    Dispense:  180 capsule    Refill:  0    Do not place this medication, or any other prescription from our practice, on "Automatic Refill". Patient may have prescription filled one day early if pharmacy is closed on scheduled refill date.    Order Specific Question:   Supervising Provider    Answer:   [name] [phone]  . morphine (MS CONTIN) 30 MG 12 hr tablet    Sig: Take 1 tablet (30 mg total) by mouth every 12 (twelve) hours for 30 days.    Dispense:  60 tablet    Refill:  0    Do not place this medication, or any other  prescription from our practice, on "Automatic Refill". Patient may have prescription filled one day early if pharmacy is closed on scheduled refill date.    Order Specific Question:   Supervising Provider    Answer:   [name] [phone]  . morphine (MS CONTIN) 30 MG 12 hr tablet    Sig: Take 1 tablet (30 mg total) by mouth every 12 (twelve) hours for 30 days.    Dispense:  60 tablet    Refill:  0    Do not place this medication, or any other prescription from our practice, on "Automatic Refill". Patient may have prescription filled one day early if pharmacy is closed on scheduled refill date.    Order Specific Question:   Supervising Provider    Answer:   [name] [phone]  . morphine (MS CONTIN) 30 MG 12 hr tablet    Sig: Take 1 tablet (30 mg total) by mouth every 12 (twelve) hours for 30 days.    Dispense:  60 tablet    Refill:  0    Do not  place this medication, or any other prescription from our practice, on "Automatic Refill". Patient may have prescription filled one day early if pharmacy is closed on scheduled refill date.    Order Specific Question:   Supervising Provider    Answer:   [name] [phone]   Orders:  No orders of the defined types were placed in this encounter.  Follow-up plan:   No follow-ups on file.   I discussed the assessment and treatment plan with the patient. The patient was provided an opportunity to ask questions and all were answered. The patient agreed with the plan and demonstrated an understanding of the instructions.  Patient advised to call back or seek an in-person evaluation if the symptoms or condition worsens.  Total duration of non-face-to-face encounter: 11 minutes.  Note by: Dionisio David, NP Date: 12/07/2018; Time: 11:14 AM  Disclaimer:  * Given the special circumstances of the COVID-19 pandemic, the federal government has announced that the Office for Civil Rights (OCR) will exercise its enforcement discretion  and will not impose penalties on physicians using telehealth in the event of noncompliance with regulatory requirements under the [place] and [place] (HIPAA) in connection with the good faith provision of telehealth during the CBSWH-67 national public health emergency. ([place])

## 2018-12-10 ENCOUNTER — Telehealth: Payer: Self-pay | Admitting: *Deleted

## 2018-12-10 NOTE — Telephone Encounter (Signed)
Clarification request received for Gabapentin 300 mg 1 capsule twice daily.  Spoke with patient and she is taking 1 in the morning and 1 in the evening.  Sig given and faxed to [facility]

## 2018-12-14 ENCOUNTER — Telehealth: Payer: Self-pay

## 2018-12-14 NOTE — Telephone Encounter (Signed)
She said her script was sent to a pharmacy in [place] and she is not in [place]. She needs to get this straightened out, she is out of meds. Please call

## 2018-12-14 NOTE — Telephone Encounter (Signed)
Patient states it has been taken care of.  No further action needed.

## 2018-12-22 ENCOUNTER — Telehealth: Payer: Self-pay | Admitting: Pulmonary Disease

## 2018-12-22 IMAGING — CT CT HEAD W/O CM
3 of 4 series · 15 of 47 positions shown, 18 images · non-contrast
Comparison: None.

CLINICAL DATA: Initial encounter for Pt fell in the bathroom when
she turned to dry her hands, she hit her head on the tub, laceration
to top of head, hx of HTN and surgery to lt TMJ, no prev ct's of
head

EXAM:
CT HEAD WITHOUT CONTRAST
TECHNIQUE: Contiguous axial images were obtained from the base of the skull
through the vertex without intravenous contrast.

[Series 2: head w/o · axial · non-contrast · 0.45mm/px · z∈[+1404,+1524]mm · 9 of 31 slices shown, 12 images]
[im 4/31  brain]
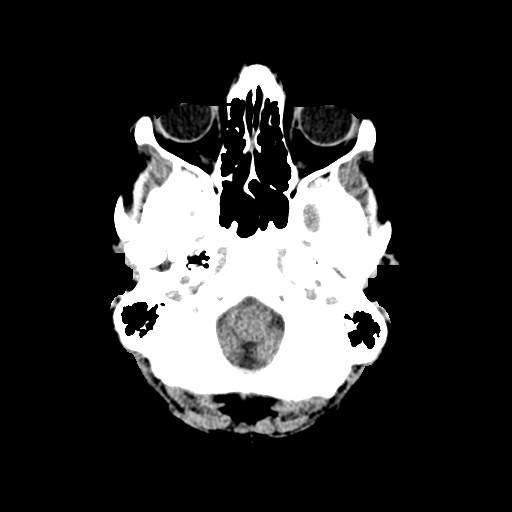
[im 4/31  bone]
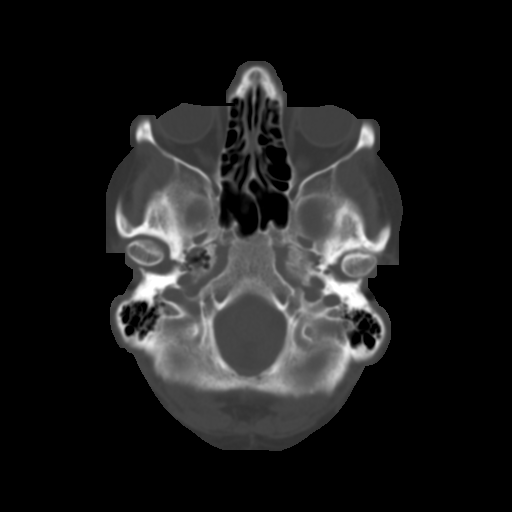
[im 7/31  brain]
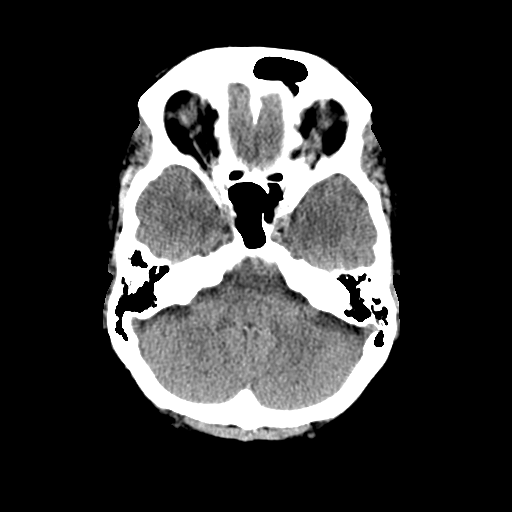
[im 10/31  brain]
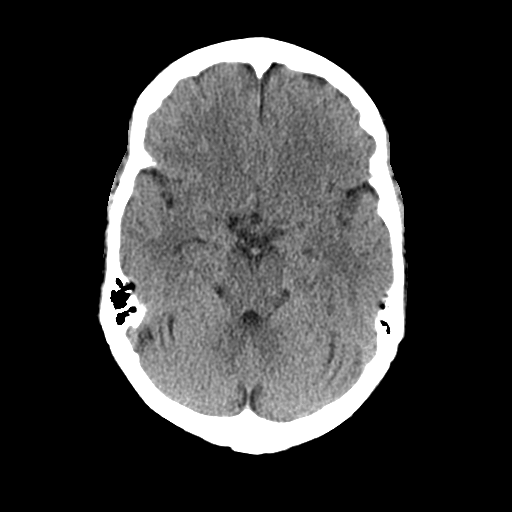
[im 13/31  brain]
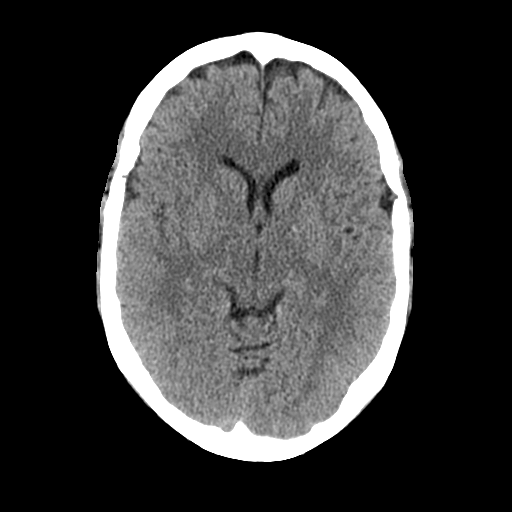
[im 16/31  brain]
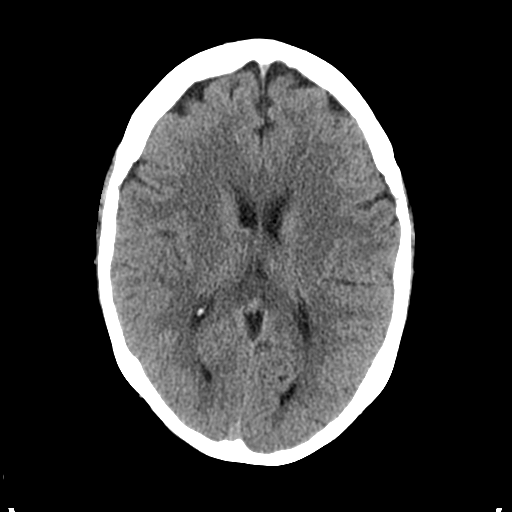
[im 16/31  bone]
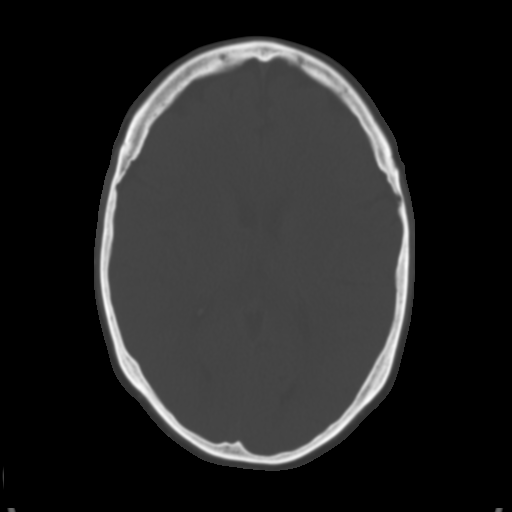
[im 19/31  brain]
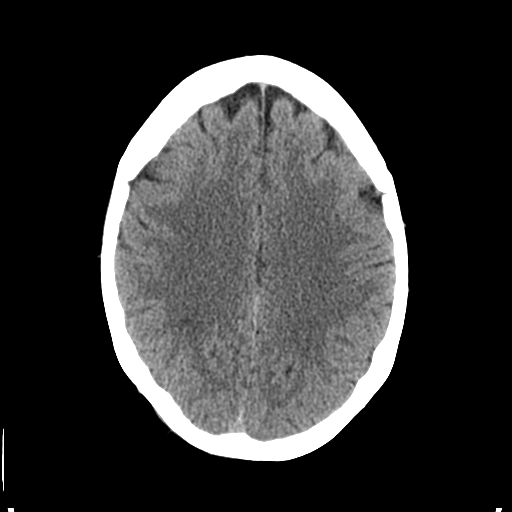
[im 22/31  brain]
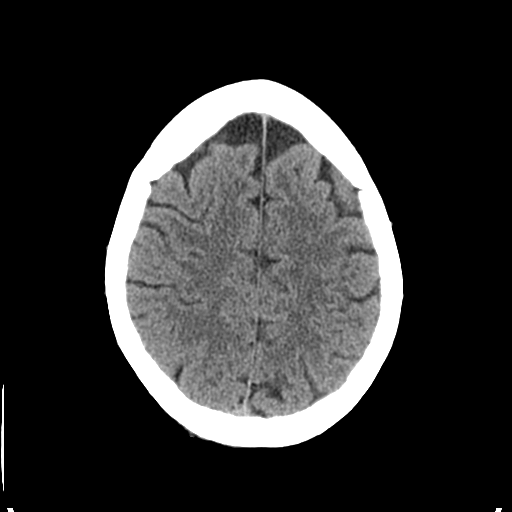
[im 25/31  brain]
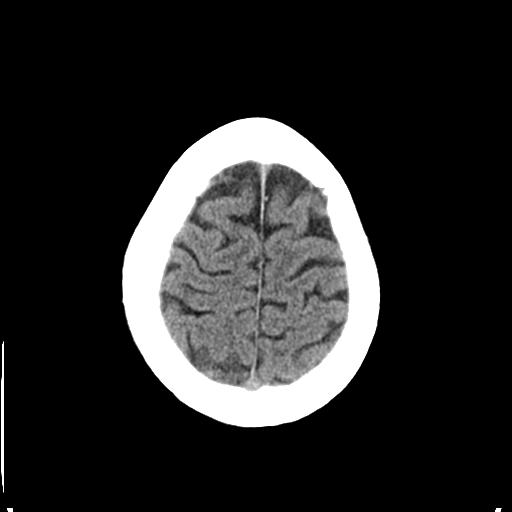
[im 28/31  brain]
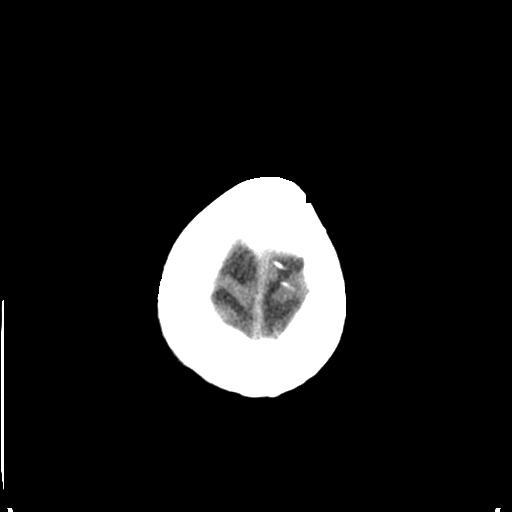
[im 28/31  bone]
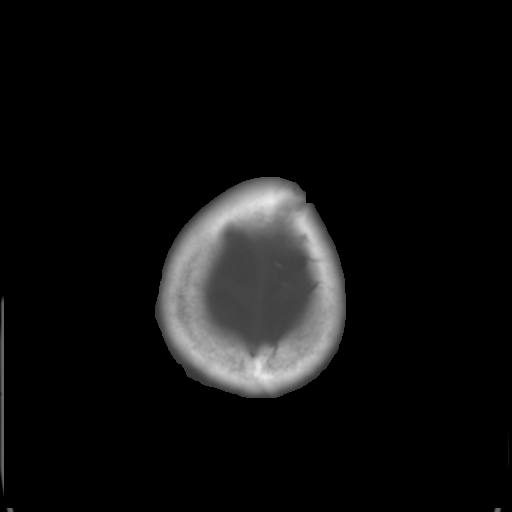

[Series 4: coronal · coronal · 0.28mm/px · 3 of 65 slices shown]
[im 22/65  brain]
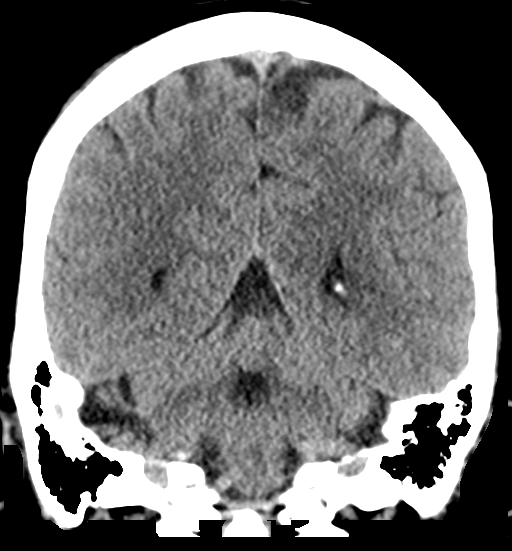
[im 29/65  brain]
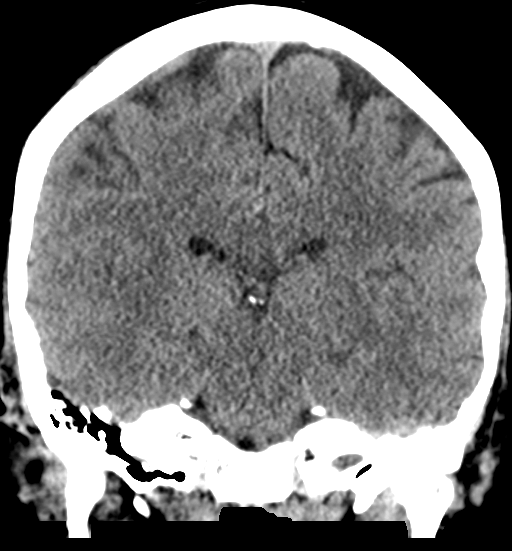
[im 36/65  brain]
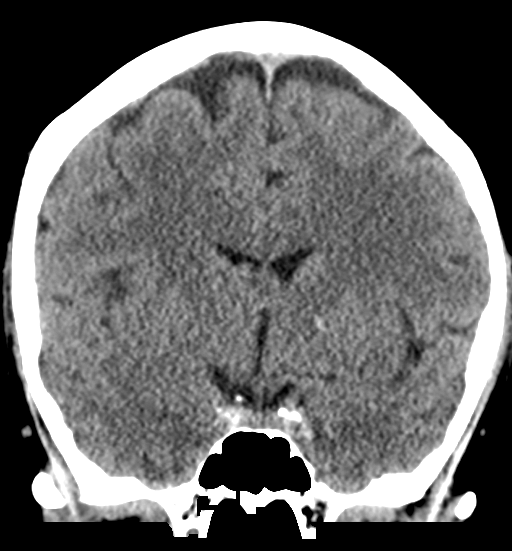

[Series 5: sagittal · sagittal · 0.30mm/px · 3 of 49 slices shown]
[im 17/49  brain]
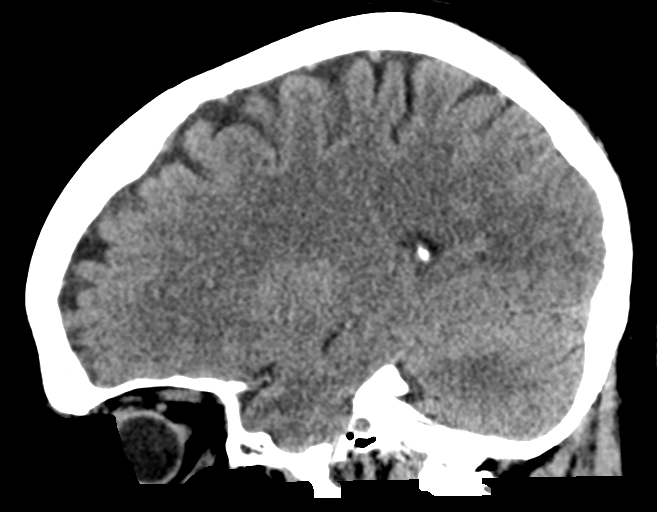
[im 25/49  brain]
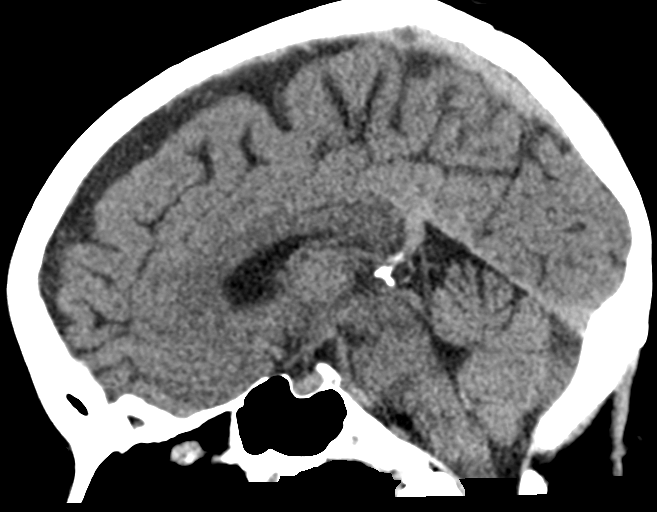
[im 33/49  brain]
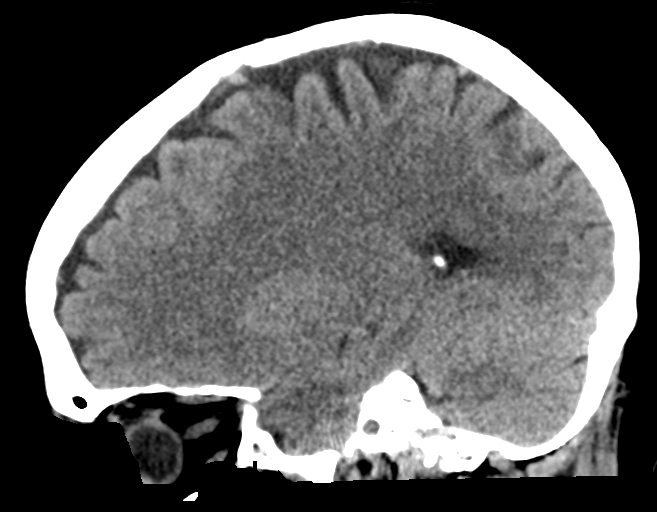

[15 of 47 positions shown; findings below may reference images not displayed]

FINDINGS: Brain: No mass lesion, hemorrhage, hydrocephalus, acute infarct,
intra-axial, or extra-axial fluid collection.

Vascular: No hyperdense vessel or unexpected calcification.

Skull: Soft tissue swelling about the posterior right vertex is
relatively mild, including on image 46/series 3. No well-defined
hematoma. No skull fracture.

Sinuses/Orbits: Normal imaged portions of the orbits and globes.
Hypoplastic right frontal sinus. Insert mastoid

Other: None.
IMPRESSION: Scalp soft tissue swelling, without acute intracranial abnormality.

## 2018-12-22 NOTE — Telephone Encounter (Signed)
Will discuss at [place].   Aaron Edelman

## 2018-12-22 NOTE — Telephone Encounter (Signed)
Called and spoke with Patient.  Patient stated she is having chronic cough and allergies. Patient stated she has 1 dose of promethazine cough med, and would like a refill.  Patient stated she is scheduled OV Thurs. With Aaron Edelman, NP. Request refill to [facility].  Message routed to [place], NP

## 2018-12-22 NOTE — Telephone Encounter (Signed)
Called and spoke with patient regarding B.Mack recommendations below B.Warner Mccreedy advised that he will discuss refills of medication at appt in two days Pt advised that she will con't her tessalon, astelin, and delsym cough syrup Pt verbalized and expressed understanding Nothing further needed at this time.

## 2018-12-22 NOTE — Telephone Encounter (Signed)
If patient is incapable of waiting to 12/24/2018 that she can be offered an office visit or tele-visit on 12/23/2018 with another APP.  Wyn Quaker FNP

## 2018-12-22 NOTE — Telephone Encounter (Signed)
When speaking with patient with Kari Gibbs recommendations Pt advised if she needs sooner appt she will call the office tomorrow 12/23/18 Nothing further needed

## 2018-12-23 NOTE — Progress Notes (Addendum)
Virtual Visit via Telephone Note  I connected with Kari Gibbs  on 12/24/18 at  9:30 AM EDT by telephone and verified that I am speaking with the correct person using two identifiers.  Location: Patient: Home Provider: [facility] - [address], [place], [place], [place] [address]   I discussed the limitations, risks, security and privacy concerns of performing an evaluation and management service by telephone and the availability of in person appointments. I also discussed with the patient that there may be a patient responsible charge related to this service. The patient expressed understanding and agreed to proceed.  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 Lamonte Sakai  Chief complaint: Cough, 67mo follow up    68 year old female never smoker followed in our office for chronic cough.  Patient was last seen in February/2020 at that point in time it was believed that her cough was likely driven by GERD.  Although she has many factors that contribute to her cough.  Patient at that point time was having breakthrough reflux despite Protonix 40 mg twice daily.  Patient was instructed to follow-up with gastroenterology.  Patient has not completed that task yet.  Patient is completing a 67-month follow-up today follow-up today which is a tele-visit due to the COVID-19 pandemic.  Patient reports that after February/2020 office visit her mother passed away as well as the COVID-19 pandemic occurred so she has not followed up with gastroenterology.  She still continues to have a dry cough although it does sound wet.  Patient does still continue to  struggle with compliance of using her flutter valve (patient with known bronchiectasis on CT).  Patient reports she does not use her flutter valve very often.  Patient continues to take her other medications as prescribed.  Patient reporting that the cough worsens throughout the day and is worse at night.  She continues to be maintained on Protonix 40 mg twice daily.  Patient is not following GERD diet.  Patient's husband reports that the patient is drinking lots of Starbucks Frappuccino's, chocolate Drizzle, and lots of dairy products and Almond milk.   Observations/Objective:  06/25/2018-CT chest high-res-negative for interstitial lung disease, minimal air trapping, probable small left adrenal adenoma, bronchiectasis and volume loss in right middle lobe  09/24/2018 - Cough ROS:  When to the symptoms start: Fall / 2019  How are you today: worse over past 2 days   Have you had fever/sore throat (first 5 to 7 days of URI) or Have you had cough/nasal congestion (10 to 14 days of URI) : dry cough Have you used anything to treat the cough, as anything improved : cough syrup, vaporizer, humidifier  Is it a dry or wet cough: dry cough  Does the cough happen when your breathing or when you breathe out:  out Other any triggers to your cough, or any aggravating factors: rain / dampness, worse when sitting, laying down, dust mites  Daily antihistamine: claritin  GERD treatment: protonix bid  Singulair: yes   Cough checklist (bolded indicates presence):  Adherence, acid reflux, ACE inhibitor, active sinus disease, active smoking, adverse effects of medications (amiodarone/Macrodantin/bb), alpha 1, allergies, aspiration, anxiety, bronchiectasis, congestive  heart failure (diastolic)   Assessment and Plan:  Chronic cough Assessment: Chronic cough still present today Known GERD Known bronchiectasis on November/2019 CT Noncompliant with flutter valve use Not following GERD diet On chronic pain  medication History of gastric bypass  This cough is likely multifactorial as patient has allergic rhinitis, known bronchiectasis, history of gastric bypass, GERD, and is not following an appropriate diet.  I do believe though the patient is likely having worsened cough episodes due to worsened GERD.  Plan: Start flutter valve use again Continue daily Claritin Can add chlor tabs at night for management of postnasal drip Continue nasal saline rinses Continue Astelin nasal spray Continue Protonix 40 mg twice daily Continue Tessalon Perles as needed Refilled promethazine cough syrup Reviewed [place] PMP aware narcotic database Referral to gastroenterology, informed patient to contact gastroenterology herself Start following GERD diet, review patient instructions on AVS Follow-up with our office in 3 months   Bronchiectasis without complication ([place]) Assessment: Known bronchiectasis  Plan: Resume flutter valve use Follow-up with our office in 3 months  GERD Assessment: Protonix 40 mg twice daily Patient is not following GERD diet: Drinking Starbucks Frappuccino's, chocolate drizzle, dairy products, Almond milk Patient still having chronic cough Patient denies tasting breakthrough reflux  Plan: Schedule follow-up with gastroenterology Continue Protonix 40 mg twice daily Review GERD diet information provided on today's AVS   Follow Up Instructions:  Return in about 3 months (around 03/26/2019), or if symptoms worsen or fail to improve, for Follow up with Wyn Quaker FNP-C, Follow up with Dr. Lamonte Sakai.   I discussed the assessment and treatment plan with the patient. The patient was provided an opportunity to ask questions and all were answered. The patient agreed with the plan and demonstrated an understanding of the instructions.   The patient was advised to call back or seek an in-person evaluation if the symptoms worsen or if the condition fails to improve as anticipated.  I  provided 28 minutes of non-face-to-face time during this encounter.   Lauraine Rinne, NP

## 2018-12-24 ENCOUNTER — Other Ambulatory Visit: Payer: Self-pay

## 2018-12-24 ENCOUNTER — Ambulatory Visit: Payer: Self-pay | Admitting: Emergency Medicine

## 2018-12-24 ENCOUNTER — Encounter: Payer: Self-pay | Admitting: Pulmonary Disease

## 2018-12-24 ENCOUNTER — Ambulatory Visit (INDEPENDENT_AMBULATORY_CARE_PROVIDER_SITE_OTHER): Payer: Medicare Other | Admitting: Pulmonary Disease

## 2018-12-24 ENCOUNTER — Ambulatory Visit: Payer: Self-pay | Admitting: Pulmonary Disease

## 2018-12-24 DIAGNOSIS — J479 Bronchiectasis, uncomplicated: Secondary | ICD-10-CM

## 2018-12-24 DIAGNOSIS — R053 Chronic cough: Secondary | ICD-10-CM

## 2018-12-24 DIAGNOSIS — R05 Cough: Secondary | ICD-10-CM | POA: Diagnosis not present

## 2018-12-24 DIAGNOSIS — K219 Gastro-esophageal reflux disease without esophagitis: Secondary | ICD-10-CM

## 2018-12-24 MED ORDER — PROMETHAZINE-DM 6.25-15 MG/5ML PO SYRP: 5.0000 mL | ORAL_SOLUTION | ORAL | 0 refills | Status: DC | PRN

## 2018-12-24 NOTE — Assessment & Plan Note (Signed)
Assessment: Known bronchiectasis  Plan: Resume flutter valve use Follow-up with our office in 3 months

## 2018-12-24 NOTE — Assessment & Plan Note (Addendum)
Assessment: Chronic cough still present today Known GERD Known bronchiectasis on November/2019 CT Noncompliant with flutter valve use Not following GERD diet On chronic pain medication History of gastric bypass  This cough is likely multifactorial as patient has allergic rhinitis, known bronchiectasis, history of gastric bypass, GERD, and is not following an appropriate diet.  I do believe though the patient is likely having worsened cough episodes due to worsened GERD.  Plan: Start flutter valve use again Continue daily Claritin Can add chlor tabs at night for management of postnasal drip Continue nasal saline rinses Continue Astelin nasal spray Continue Protonix 40 mg twice daily Continue Tessalon Perles as needed Refilled promethazine cough syrup Reviewed [place] PMP aware narcotic database Referral to gastroenterology, informed patient to contact gastroenterology herself Start following GERD diet, review patient instructions on AVS Follow-up with our office in 3 months

## 2018-12-24 NOTE — Assessment & Plan Note (Addendum)
Assessment: Protonix 40 mg twice daily Patient is not following GERD diet: Drinking Starbucks Frappuccino's, chocolate drizzle, dairy products, Almond milk Patient still having chronic cough Patient denies tasting breakthrough reflux  Plan: Schedule follow-up with gastroenterology Continue Protonix 40 mg twice daily Review GERD diet information provided on today's AVS

## 2018-12-24 NOTE — Patient Instructions (Addendum)
Follow up with Gastroenterology ASAP  >>> Dr Michail Sermon [facility] - referral placed  Please record which you have been eating for the next 1 to 2 weeks.  Please include every snack, meal, drink.  This can be reviewed with Dr. Michail Sermon.  This can also be helpful for you to try to track when your symptoms worsen.  And see which foods may be causing this.  Protonix 40 mg tablet  >>>Please take 1 tablet daily 15 minutes to 30 minutes before your first and last meal of the day as well as before your other medications >>>Try to take at the same time each day >>>take this medication daily  GERD management: >>>Avoid laying flat until 2 hours after meals >>>Elevate head of the bed including entire chest >>>Reduce size of meals and amount of fat, acid, spices, caffeine and sweets >>>If you are smoking, Please stop! >>>Decrease alcohol consumption >>>Work on maintaining a healthy weight with normal BMI    Cough Home Instructions:  We believe you have a chronic/cyclical cough that is aggravated by reflux , coughing , and drainage.   Goal is to not Cough or clear throat.   Avoid coughing or clearing throat by using:  ? non-mint products/sugarless candy ? Water ? ice chips ? Remember NO MINT PRODUCTS   Medications to use:  ? Mucinex DM 1-2 every 12 hrs or Delsym 2 tsp every 12 hrs for cough (These are Over the counter) ? Tessalon Three times a day  As needed  Cough.  ? Protonix 40 mg 30 min before breakfast and dinner.  ? Continue Claritin daily  (Can use generic, this is over the counter) ? Chlor tabs 4mg  2 at bedtime  for nasal drip until cough is 100% cough free. (this medication is over the counter)  Run this by pain management   This medication is sedating    I have refilled your promethazine cough syrup >>> Make sure pain management is aware of this >>> This medication can be sedating  Bronchiectasis: This is the medical term which indicates that you have damage,  dilated airways making you more susceptible to respiratory infection. Use a flutter valve 10 breaths twice a day or 4 to 5 breaths 4-5 times a day to help clear mucus out Let us know if you have cough with change in mucus color or fevers or chills.  At that point you would need an antibiotic. Maintain a healthy nutritious diet, eating whole foods Take your medications as prescribed    Return in about 3 months (around 03/26/2019), or if symptoms worsen or fail to improve, for Follow up with Wyn Quaker FNP-C, Follow up with Dr. Lamonte Sakai.   Coronavirus (COVID-19) Are you at risk?  Are you at risk for the Coronavirus (COVID-19)?  To be considered HIGH RISK for Coronavirus (COVID-19), you have to meet the following criteria:  . Traveled to [place], [place], [place], [place] or [place]; or in the [place] to [place], [place], [place], or [place]; and have fever, cough, and shortness of breath within the last 2 weeks of travel OR . Been in close contact with a person diagnosed with COVID-19 within the last 2 weeks and have fever, cough, and shortness of breath . IF YOU DO NOT MEET THESE CRITERIA, YOU ARE CONSIDERED LOW RISK FOR COVID-19.  What to do if you are HIGH RISK for COVID-19?  Marland Kitchen If you are having a medical emergency, call 911. . Seek medical care  right away. Before you go to a doctor's office, urgent care or emergency department, call ahead and tell them about your recent travel, contact with someone diagnosed with COVID-19, and your symptoms. You should receive instructions from your physician's office regarding next steps of care.  . When you arrive at healthcare provider, tell the healthcare staff immediately you have returned from visiting [place], [place], [place], [place] or [place]; or traveled in the [place] to [place], [place], [place], or [place]; in the last two weeks or you have been in close contact with a person diagnosed with COVID-19 in the last 2 weeks.    . Tell the health care staff about your symptoms: fever, cough and shortness of breath. . After you have been seen by a medical provider, you will be either: o Tested for (COVID-19) and discharged home on quarantine except to seek medical care if symptoms worsen, and asked to  - Stay home and avoid contact with others until you get your results (4-5 days)  - Avoid travel on public transportation if possible (such as bus, train, or airplane) or o Sent to the Emergency Department by EMS for evaluation, COVID-19 testing, and possible admission depending on your condition and test results.  What to do if you are LOW RISK for COVID-19?  Reduce your risk of any infection by using the same precautions used for avoiding the common cold or flu:  Marland Kitchen Wash your hands often with soap and warm water for at least 20 seconds.  If soap and water are not readily available, use an alcohol-based hand sanitizer with at least 60% alcohol.  . If coughing or sneezing, cover your mouth and nose by coughing or sneezing into the elbow areas of your shirt or coat, into a tissue or into your sleeve (not your hands). . Avoid shaking hands with others and consider head nods or verbal greetings only. . Avoid touching your eyes, nose, or mouth with unwashed hands.  . Avoid close contact with people who are sick. . Avoid places or events with large numbers of people in one location, like concerts or sporting events. . Carefully consider travel plans you have or are making. . If you are planning any travel outside or inside the [place], visit the CDC's Travelers' Health webpage for the latest health notices. . If you have some symptoms but not all symptoms, continue to monitor at home and seek medical attention if your symptoms worsen. . If you are having a medical emergency, call 911.   [place] / e-Visit: [email or web address]          [facility]: [facility]: [phone]                   [facility]: [phone]           It is flu season:   >>> Best ways to protect herself from the flu: Receive the yearly flu vaccine, practice good hand hygiene washing with soap and also using hand sanitizer when available, eat a nutritious meals, get adequate rest, hydrate appropriately   Please contact the office if your symptoms worsen or you have concerns that you are not improving.   Thank you for choosing  [facility] for your healthcare, and for allowing [place] to partner with you on your healthcare journey. I am thankful to be able to provide care to you today.  Wyn Quaker FNP-C    Food Choices for Gastroesophageal Reflux Disease, Adult When you have gastroesophageal reflux disease (GERD), the foods you eat and your eating habits are very important. Choosing the right foods can help ease your discomfort. Think about working with a nutrition specialist (dietitian) to help you make good choices. What are tips for following this plan?  Meals  Choose healthy foods that are low in fat, such as fruits, vegetables, whole grains, low-fat dairy products, and lean meat, fish, and poultry.  Eat small meals often instead of 3 large meals a day. Eat your meals slowly, and in a place where you are relaxed. Avoid bending over or lying down until 2-3 hours after eating.  Avoid eating meals 2-3 hours before bed.  Avoid drinking a lot of liquid with meals.  Cook foods using methods other than frying. Bake, grill, or broil food instead.  Avoid or limit: ? Chocolate. ? Peppermint or spearmint. ? Alcohol. ? Pepper. ? Black and decaffeinated coffee. ? Black and decaffeinated tea. ? Bubbly (carbonated) soft drinks. ? Caffeinated energy drinks and soft drinks.  Limit high-fat foods such as: ? Fatty meat or fried foods. ? Whole milk, cream, butter,  or ice cream. ? Nuts and nut butters. ? Pastries, donuts, and sweets made with butter or shortening.  Avoid foods that cause symptoms. These foods may be different for everyone. Common foods that cause symptoms include: ? Tomatoes. ? Oranges, lemons, and limes. ? Peppers. ? Spicy food. ? Onions and garlic. ? Vinegar. Lifestyle  Maintain a healthy weight. Ask your doctor what weight is healthy for you. If you need to lose weight, work with your doctor to do so safely.  Exercise for at least 30 minutes for 5 or more days each week, or as told by your doctor.  Wear loose-fitting clothes.  Do not smoke. If you need help quitting, ask your doctor.  Sleep with the head of your bed higher than your feet. Use a wedge under the mattress or blocks under the bed frame to raise the head of the bed. Summary  When you have gastroesophageal reflux disease (GERD), food and lifestyle choices are very important in easing your symptoms.  Eat small meals often instead of 3 large meals a day. Eat your meals slowly, and in a place where you are relaxed.  Limit high-fat foods such as fatty meat or fried foods.  Avoid bending over or lying down until 2-3 hours after eating.  Avoid peppermint and spearmint, caffeine, alcohol, and chocolate. This information is not intended to replace advice given to you by your health care provider. Make sure you discuss any questions you have with your health care provider. Document Released: 01/28/2012 Document Revised: 09/03/2016 Document Reviewed: 09/03/2016 Elsevier Interactive Patient Education  2019 [employer].

## 2019-01-06 ENCOUNTER — Other Ambulatory Visit: Payer: Self-pay | Admitting: Pulmonary Disease

## 2019-01-06 DIAGNOSIS — R05 Cough: Secondary | ICD-10-CM

## 2019-01-06 DIAGNOSIS — R053 Chronic cough: Secondary | ICD-10-CM

## 2019-01-07 NOTE — Telephone Encounter (Signed)
Spoke with Lovey Newcomer at [facility] to make sure that the patient did not receive the RX from 2 weeks ago. Per Lovey Newcomer, patient last received the promethazine on 11/17/18. She checked the [facility] for all [facility] the [facility] stores and only the 4/7 prescription appeared.   She stated that her insurance is requiring her to get a 90 day supply of the promethazine cough syrup. Anything less than a 90 supply will require her to pay for it out of pocket.   I have never heard of an insurance requiring a 90 day supply of narcotic cough syrup.   Aaron Edelman, please advise. Thanks.

## 2019-01-07 NOTE — Telephone Encounter (Signed)
This is not a narcotic cough syrup.  So 90-day refill is okay.  I just want to be sure that the patient did not pick up this medication 2 weeks ago when I originally ordered it.  I sent it to the patient's mail order pharmacy so I am unsure why now this is being sent back to me.  I have approved the refill request.  Aaron Edelman

## 2019-01-07 NOTE — Telephone Encounter (Signed)
This was just ordered 2 weeks ago. She is already out? I will not refill if so.   Aaron Edelman

## 2019-02-21 ENCOUNTER — Other Ambulatory Visit: Payer: Self-pay | Admitting: Pulmonary Disease

## 2019-02-21 DIAGNOSIS — R05 Cough: Secondary | ICD-10-CM

## 2019-02-21 DIAGNOSIS — R053 Chronic cough: Secondary | ICD-10-CM

## 2019-02-22 NOTE — Telephone Encounter (Signed)
Promethazine-DM 6.25-15mg . Take 5mls by mouth as needed. #229ml  Last filled 01/07/19 #240 mls with no refills   Last office visit(TELEVISIt)  12/24/18 with Wyn Quaker, NP  No return visit scheduled.

## 2019-02-23 ENCOUNTER — Encounter: Payer: Self-pay | Admitting: Pain Medicine

## 2019-02-23 ENCOUNTER — Telehealth: Payer: Self-pay

## 2019-02-23 ENCOUNTER — Telehealth: Payer: Self-pay | Admitting: *Deleted

## 2019-02-23 NOTE — Telephone Encounter (Signed)
Mailbox is full and unable to leave message.

## 2019-02-23 NOTE — Telephone Encounter (Signed)
Attempted to call for pre appointment assessment. Unable to leave a message, "mailbox is full"

## 2019-02-23 NOTE — Progress Notes (Signed)
Pain Management Virtual Encounter Note - Virtual Visit via Telephone Telehealth (real-time audio visits between healthcare provider and patient).   Patient's Phone No. & Preferred Pharmacy:  [phone] (home); [phone] (mobile); (Preferred) [phone] [email or web address]  [id number] - [place], [place] - [address] AT [address] [place] [place] [address] Phone: [phone] Fax: [phone]    Pre-screening note:  Our staff contacted Ms. Vanderweide and offered her an "in person", "face-to-face" appointment versus a telephone encounter. She indicated preferring the telephone encounter, at this time.   Reason for Virtual Visit: COVID-19*  Social distancing based on CDC and AMA recommendations.   I contacted Charline Hoskinson on 02/24/2019 via telephone.      I clearly identified myself as Gaspar Cola, MD. I verified that I was speaking with the correct person using two identifiers (Name: Genette Huertas, and date of birth: [birth year]).  Advanced Informed Consent I sought verbal advanced consent from [name] for virtual visit interactions. I informed Ms. Fuhrmann of possible security and privacy concerns, risks, and limitations associated with providing "not-in-person" medical evaluation and management services. I also informed Ms. Spoerl of the availability of "in-person" appointments. Finally, I informed her that there would be a charge for the virtual visit and that she could be  personally, fully or partially, financially responsible for it. Ms. Minnifield expressed understanding and agreed to proceed.   Historic Elements   Ms. Kelbi Renstrom is a 68 y.o. year old, female patient evaluated today after her last encounter by our practice on 02/23/2019. Ms. 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 Ms. Pardi has a current medication list which includes the following prescription(s): azelastine, benzonatate, buspirone, vitamin d3, conjugated estrogens, diclofenac sodium, gabapentin, irbesartan, lubiprostone, magnesium, montelukast, morphine, morphine, morphine, naloxone, pantoprazole, prenatal vit-fe fum-fa-omega, align, promethazine-dextromethorphan, flutter, venlafaxine xr, vitamin b-12, and benefiber. She  reports that she has never smoked. She has never used smokeless tobacco. She reports that she does not drink alcohol or use drugs. Ms. Huesman is allergic to iohexol; ace inhibitors; losartan potassium; and neosporin [neomycin-bacitracin zn-polymyx].   HPI  Today, she is being contacted for medication management.  Pharmacotherapy Assessment  Analgesic: MS Contin 30 mg, 1 tab PO q 12 hrs (60 mg/day of morphine) MME/day:60 mg/day.   Monitoring: Pharmacotherapy: No side-effects or adverse reactions reported. [place] PMP: PDMP reviewed during this encounter.       Compliance: No problems identified. Effectiveness: Clinically acceptable. Plan: Refer to "POC".  Pertinent Labs   SAFETY SCREENING Profile Lab Results  Component Value Date   STAPHAUREUS NEGATIVE  02/26/2012   MRSAPCR NEGATIVE 02/26/2012   Renal Function Lab Results  Component Value Date   BUN 20 01/04/2016   CREATININE 0.76 01/04/2016   GFRAA >60 01/04/2016   GFRNONAA >60 01/04/2016   Hepatic Function Lab Results  Component Value Date   AST  33 01/04/2016   ALT 26 01/04/2016   ALBUMIN 4.2 01/04/2016   UDS Summary  Date Value Ref Range Status  09/07/2018 FINAL  Final    Comment:    ==================================================================== TOXASSURE SELECT 13 (MW) ==================================================================== Test                             Result       Flag       Units Drug Present and Declared for Prescription Verification   Morphine                       >7519        EXPECTED   ng/mg creat   Normorphine                    1265         EXPECTED   ng/mg creat    Potential sources of large amounts of morphine in the absence of    codeine include administration of morphine or use of heroin.    Normorphine is an expected metabolite of morphine.   Hydromorphone                  347          EXPECTED   ng/mg creat    Hydromorphone may be present as a metabolite of morphine;    concentrations of hydromorphone rarely exceed 5% of the morphine    concentration when this is the source of hydromorphone. ==================================================================== Test                      Result    Flag   Units      Ref Range   Creatinine              133              mg/dL      >=20 ==================================================================== Declared Medications:  The flagging and interpretation on this report are based on the  following declared medications.  Unexpected results may arise from  inaccuracies in the declared medications.  **Note: The testing scope of this panel includes these medications:  Morphine (MS Contin)  **Note: The testing scope of this panel does not include following  reported medications:   Azelastine (Astelin)  Benzonatate (Tessalon)  Buspirone (BuSpar)  Cyanocobalamin  Dextromethorphan (Prometh with Dextromethorphan)  Estrogen (Premarin)  Gabapentin  Irbesartan (Avapro)  Magnesium  Montelukast (Singulair)  Multivitamin (Prenatal Vitamin)  Naloxone (Narcan)  Pantoprazole (Protonix)  Promethazine (Prometh with Dextromethorphan)  Supplement  Supplement (Probiotic)  Topical Diclofenac  Vitamin D3 ==================================================================== For clinical consultation, please call [phone]. ====================================================================    Note: Above Lab results reviewed.  Recent imaging  CT Chest High Resolution CLINICAL DATA:  One-month history of cough. Evaluate for interstitial lung disease.  EXAM: CT CHEST WITHOUT CONTRAST  TECHNIQUE: Multidetector CT imaging of the chest was performed following the standard protocol without intravenous contrast. High resolution imaging of the lungs, as well as inspiratory and expiratory imaging, was performed.  COMPARISON:  Chest radiograph 06/03/2018.  Chest CT 11/24/2013.  FINDINGS: Cardiovascular: Atherosclerotic calcification of the arterial vasculature. Heart is at the upper limits of normal in size. No pericardial effusion.  Mediastinum/Nodes: Mediastinal lymph nodes are not enlarged by CT size criteria. Hilar regions are difficult to evaluate without IV contrast. No axillary adenopathy. Esophagus is grossly unremarkable.  Lungs/Pleura:  Linear scarring, bronchiectasis and volume loss in the right middle lobe, lingula and both lower lobes. Negative for subpleural reticulation, traction bronchiectasis/bronchiolectasis, ground-glass, architectural distortion or honeycombing. Minimal air trapping. No pleural fluid. Airway is unremarkable.  Upper Abdomen: Visualized portion of the liver is unremarkable. Common bile duct dilatation may be related to  cholecystectomy. Right adrenal gland is unremarkable. 11 mm left adrenal nodule measures 21 Hounsfield units and is likely unchanged. Tiny stone in the left kidney. Visualized portions of the spleen and pancreas are grossly unremarkable. Gastric bypass. No upper abdominal adenopathy.  Musculoskeletal: No worrisome lytic or sclerotic lesions.  IMPRESSION: 1. Negative for interstitial lung disease. 2. Minimal air trapping, indicative of small airways disease. 3. Probable small left adrenal adenoma. 4. Tiny left renal stone. 5. Aortic atherosclerosis (ICD10-170.0).  Electronically Signed   By: Lorin Picket M.D.   On: 06/25/2018 08:30  Assessment  The primary encounter diagnosis was Chronic pain syndrome. Diagnoses of Chronic flank pain (Primary Area of Pain) (Left), Neurogenic pain, Opioid-induced constipation (OIC), Pharmacologic therapy, Disorder of skeletal system, and Problems influencing health status were also pertinent to this visit.  Plan of Care  I have discontinued Lucile Shutters Benefiber, morphine, and morphine. I have also changed her gabapentin, morphine, and diclofenac sodium. Additionally, I am having her start on Benefiber, lubiprostone, morphine, and morphine. Lastly, I am having her maintain her Align, PRENATAL VIT-FE FUM-FA-OMEGA PO, Magnesium, conjugated estrogens, busPIRone, vitamin B-12, Vitamin D3, naloxone, irbesartan, azelastine, pantoprazole, montelukast, benzonatate, Flutter, venlafaxine XR, and promethazine-dextromethorphan.  Pharmacotherapy (Medications Ordered): Meds ordered this encounter  Medications  . gabapentin (NEURONTIN) 400 MG capsule    Sig: Take 1 capsule (400 mg total) by mouth 2 (two) times daily.    Dispense:  180 capsule    Refill:  0    Fill one day early if pharmacy is closed on scheduled refill date. May substitute for generic if available.  . Wheat Dextrin (BENEFIBER) POWD    Sig: Take 6 g by mouth 3 (three) times daily before  meals. (2 tsp = 6 g)    Dispense:  730 g    Refill:  2    Fill one day early if pharmacy is closed on scheduled refill date. May substitute for generic if available.  . lubiprostone (AMITIZA) 8 MCG capsule    Sig: Take 1 capsule (8 mcg total) by mouth 2 (two) times daily with a meal. Swallow whole, do not break or chew the medication.    Dispense:  60 capsule    Refill:  2    Fill one day early if pharmacy is closed on scheduled refill date. May substitute for generic if available.  . morphine (MS CONTIN) 30 MG 12 hr tablet    Sig: Take 1 tablet (30 mg total) by mouth every 12 (twelve) hours. Must last 30 days    Dispense:  60 tablet    Refill:  0    Chronic Pain: STOP Act (Not applicable) Fill 1 day early if closed on refill date. Do not fill until: 03/21/2019. To last until: 04/20/2019. Avoid benzodiazepines within 8 hours of opioids  . morphine (MS CONTIN) 30 MG 12 hr tablet    Sig: Take 1 tablet (30 mg total) by mouth every 12 (twelve) hours. Must last 30 days    Dispense:  60 tablet    Refill:  0    Chronic Pain: STOP Act (Not applicable) Fill 1 day early if closed on refill date. Do not fill until:  04/20/2019. To last until: 05/20/2019. Avoid benzodiazepines within 8 hours of opioids  . morphine (MS CONTIN) 30 MG 12 hr tablet    Sig: Take 1 tablet (30 mg total) by mouth every 12 (twelve) hours. Must last 30 days    Dispense:  60 tablet    Refill:  0    Chronic Pain: STOP Act (Not applicable) Fill 1 day early if closed on refill date. Do not fill until: 05/20/2019. To last until: 06/19/2019. Avoid benzodiazepines within 8 hours of opioids  . diclofenac sodium (VOLTAREN) 1 % GEL    Sig: Apply 4 g topically 4 (four) times daily.    Dispense:  350 g    Refill:  4    Fill one day early if pharmacy is closed on scheduled refill date. May substitute for generic if available.   Orders:  Orders Placed This Encounter  Procedures  . Comp. Metabolic Panel (12)    With GFR. Indications: Chronic  Pain Syndrome (G89.4) & Pharmacotherapy (V95.638)    Order Specific Question:   Has the patient fasted?    Answer:   No    Order Specific Question:   CC Results    Answer:   PCP-NURSE [[id number]]  . Magnesium    Indication: Pharmacologic therapy (I95.188)    Order Specific Question:   CC Results    Answer:   PCP-NURSE [[id number]]  . Vitamin B12    Indication: Pharmacologic therapy (T01.601).    Order Specific Question:   CC Results    Answer:   PCP-NURSE [[id number]]  . Sedimentation rate    Indication: Disorder of skeletal system (M89.9)    Order Specific Question:   CC Results    Answer:   PCP-NURSE [[id number]]  . 25-Hydroxyvitamin D Lcms D2+D3    Indication: Disorder of skeletal system (M89.9).    Order Specific Question:   CC Results    Answer:   PCP-NURSE [[id number]]  . C-reactive protein    Indication: Problems influencing health status (Z78.9)    Order Specific Question:   CC Results    Answer:   PCP-NURSE [[id number]]   Follow-up plan:   Return in about 16 weeks (around 06/16/2019) for (VV), E/M (MM).      Interventional management options:  Considering:   Diagnosticleft costochondral joint injection  Possible left intercostal nerve RFA  Therapeutic Left greater occipital nerve RFA Diagnostic left C2 + TON nerve block Possible left C2 + TON RFA    Palliative PRN treatment(s):   Palliative left intercostal nerve blocks (T5 to T8) #2  Palliative left GONB block #3  Palliative bilateral sacroiliac joint block #2     Recent Visits Date Type Provider Dept  12/07/18 Office Visit Vevelyn Francois, NP [place] recent visits within past 90 days and meeting all other requirements   Today's Visits Date Type Provider Dept  02/24/19 Office Visit [name], MD [facility]  Showing today's visits and meeting all other requirements   Future Appointments No visits were found meeting these conditions.  Showing future appointments within next 90 days  and meeting all other requirements   I discussed the assessment and treatment plan with the patient. The patient was provided an opportunity to ask questions and all were answered. The patient agreed with the plan and demonstrated an understanding of the instructions.  Patient advised to call back or seek an in-person evaluation if the symptoms or condition worsens.  Total duration of non-face-to-face encounter: 15 minutes.  Note by: Gaspar Cola, MD Date: 02/24/2019; Time: 10:21 AM  Note: This dictation was prepared with Dragon dictation. Any transcriptional errors that may result from this process are unintentional.  Disclaimer:  * Given the special circumstances of the COVID-19 pandemic, the federal government has announced that the Office for Civil Rights (OCR) will exercise its enforcement discretion and will not impose penalties on physicians using telehealth in the event of noncompliance with regulatory requirements under the [place] and [place] (HIPAA) in connection with the good faith provision of telehealth during the FHLKT-62 national public health emergency. ([place])

## 2019-02-24 ENCOUNTER — Other Ambulatory Visit: Payer: Self-pay

## 2019-02-24 ENCOUNTER — Ambulatory Visit: Payer: Medicare Other | Attending: Pain Medicine | Admitting: Pain Medicine

## 2019-02-24 DIAGNOSIS — G894 Chronic pain syndrome: Secondary | ICD-10-CM

## 2019-02-24 DIAGNOSIS — M792 Neuralgia and neuritis, unspecified: Secondary | ICD-10-CM | POA: Diagnosis not present

## 2019-02-24 DIAGNOSIS — Z789 Other specified health status: Secondary | ICD-10-CM | POA: Insufficient documentation

## 2019-02-24 DIAGNOSIS — T402X5A Adverse effect of other opioids, initial encounter: Secondary | ICD-10-CM

## 2019-02-24 DIAGNOSIS — R109 Unspecified abdominal pain: Secondary | ICD-10-CM

## 2019-02-24 DIAGNOSIS — G8929 Other chronic pain: Secondary | ICD-10-CM

## 2019-02-24 DIAGNOSIS — Z79899 Other long term (current) drug therapy: Secondary | ICD-10-CM

## 2019-02-24 DIAGNOSIS — M899 Disorder of bone, unspecified: Secondary | ICD-10-CM

## 2019-02-24 DIAGNOSIS — K5903 Drug induced constipation: Secondary | ICD-10-CM

## 2019-02-24 DIAGNOSIS — R10A2 Flank pain, left side: Secondary | ICD-10-CM

## 2019-02-24 MED ORDER — BENEFIBER PO POWD: 6.0000 g | Freq: Three times a day (TID) | ORAL | 2 refills | Status: DC

## 2019-02-24 MED ORDER — DICLOFENAC SODIUM 1 % TD GEL: 4.0000 g | Freq: Four times a day (QID) | TRANSDERMAL | 4 refills | Status: DC

## 2019-02-24 MED ORDER — MORPHINE SULFATE ER 30 MG PO TBCR: 30.0000 mg | EXTENDED_RELEASE_TABLET | Freq: Two times a day (BID) | ORAL | 0 refills | Status: DC

## 2019-02-24 MED ORDER — LUBIPROSTONE 8 MCG PO CAPS: 8.0000 ug | ORAL_CAPSULE | Freq: Two times a day (BID) | ORAL | 2 refills | Status: DC

## 2019-02-24 MED ORDER — GABAPENTIN 400 MG PO CAPS: 400.0000 mg | ORAL_CAPSULE | Freq: Two times a day (BID) | ORAL | 0 refills | Status: DC

## 2019-03-15 ENCOUNTER — Other Ambulatory Visit: Payer: Self-pay | Admitting: Gastroenterology

## 2019-03-20 ENCOUNTER — Other Ambulatory Visit: Payer: Self-pay | Admitting: Pain Medicine

## 2019-03-20 DIAGNOSIS — G894 Chronic pain syndrome: Secondary | ICD-10-CM

## 2019-03-20 MED ORDER — MORPHINE SULFATE ER 30 MG PO TBCR: 30.0000 mg | EXTENDED_RELEASE_TABLET | Freq: Two times a day (BID) | ORAL | 0 refills | Status: DC

## 2019-04-01 ENCOUNTER — Other Ambulatory Visit (HOSPITAL_COMMUNITY)
Admission: RE | Admit: 2019-04-01 | Discharge: 2019-04-01 | Disposition: A | Discharge status: Home / Self Care | Payer: Medicare Other | Source: Ambulatory Visit | Attending: Gastroenterology | Admitting: Gastroenterology

## 2019-04-01 DIAGNOSIS — Z20828 Contact with and (suspected) exposure to other viral communicable diseases: Secondary | ICD-10-CM | POA: Diagnosis not present

## 2019-04-01 DIAGNOSIS — Z01812 Encounter for preprocedural laboratory examination: Secondary | ICD-10-CM | POA: Insufficient documentation

## 2019-04-01 LAB — SARS CORONAVIRUS 2 (TAT 6-24 HRS): SARS Coronavirus 2: NEGATIVE

## 2019-04-05 ENCOUNTER — Ambulatory Visit (HOSPITAL_COMMUNITY)
Admission: RE | Admit: 2019-04-05 | Discharge: 2019-04-05 | Disposition: A | Discharge status: Home / Self Care | Payer: Medicare Other | Attending: Gastroenterology | Admitting: Gastroenterology

## 2019-04-05 ENCOUNTER — Encounter (HOSPITAL_COMMUNITY)
Admission: RE | Disposition: A | Discharge status: Home / Self Care | Payer: Self-pay | Source: Home / Self Care | Attending: Gastroenterology

## 2019-04-05 ENCOUNTER — Other Ambulatory Visit: Payer: Self-pay | Admitting: Pulmonary Disease

## 2019-04-05 DIAGNOSIS — R1310 Dysphagia, unspecified: Secondary | ICD-10-CM | POA: Diagnosis not present

## 2019-04-05 DIAGNOSIS — R05 Cough: Secondary | ICD-10-CM

## 2019-04-05 DIAGNOSIS — R053 Chronic cough: Secondary | ICD-10-CM

## 2019-04-05 HISTORY — PX: ESOPHAGEAL MANOMETRY: SHX5429

## 2019-04-05 SURGERY — MANOMETRY, ESOPHAGUS

## 2019-04-05 MED ORDER — LIDOCAINE VISCOUS HCL 2 % MT SOLN
OROMUCOSAL | Status: AC
  Filled 2019-04-05: qty 15

## 2019-04-05 MED ORDER — LIDOCAINE VISCOUS HCL 2 % MT SOLN: 15.0000 mL | Freq: Once | OROMUCOSAL | Status: DC

## 2019-04-05 SURGICAL SUPPLY — 2 items
FACESHIELD LNG OPTICON STERILE (SAFETY) IMPLANT
GLOVE BIO SURGEON STRL SZ8 (GLOVE) ×4 IMPLANT

## 2019-04-05 NOTE — Progress Notes (Signed)
Esophageal Manometry done per protocol. Pt tolerated fair with no complications .

## 2019-04-06 NOTE — Telephone Encounter (Signed)
Appropriate for refill

## 2019-04-06 NOTE — Telephone Encounter (Signed)
RB pt, please send to him   Wyn Quaker FNP

## 2019-04-08 ENCOUNTER — Encounter (HOSPITAL_COMMUNITY): Payer: Self-pay | Admitting: Gastroenterology

## 2019-04-14 ENCOUNTER — Other Ambulatory Visit: Payer: Self-pay | Admitting: Gastroenterology

## 2019-04-14 ENCOUNTER — Telehealth: Payer: Self-pay | Admitting: Pain Medicine

## 2019-04-14 DIAGNOSIS — R1310 Dysphagia, unspecified: Secondary | ICD-10-CM

## 2019-04-14 NOTE — Telephone Encounter (Signed)
Patient spoke with Dr. Dossie Arbour last month because she couldn't get her meds, the pharmacy didn't have enough. She followed Dr. Dossie Arbour instructions [facility] to discuss being put on a list to ensure that they have her meds when it is time to pick them up. She was told [facility] and they would not do that.  Patient is asking what Dr. Dossie Arbour suggest she do since it is coming up to fill scripts on Tues and Monday is holiday as well as Dr. Dossie Arbour will be out of town.   Please call patient.

## 2019-04-15 ENCOUNTER — Telehealth: Payer: Self-pay | Admitting: Pulmonary Disease

## 2019-04-15 DIAGNOSIS — R05 Cough: Secondary | ICD-10-CM

## 2019-04-15 DIAGNOSIS — R053 Chronic cough: Secondary | ICD-10-CM

## 2019-04-15 MED ORDER — PROMETHAZINE-CODEINE 6.25-10 MG/5ML PO SYRP: 5.0000 mL | ORAL_SOLUTION | Freq: Four times a day (QID) | ORAL | 0 refills | Status: DC | PRN

## 2019-04-15 MED ORDER — PROMETHAZINE-DM 6.25-15 MG/5ML PO SYRP: 5.0000 mL | ORAL_SOLUTION | Freq: Four times a day (QID) | ORAL | 0 refills | Status: DC | PRN

## 2019-04-15 NOTE — Telephone Encounter (Signed)
Pt called back stating that she is out of town and would [facility] script [facility]  Which is  [address] [place] [address] phone # [phone] .Hillery Hunter

## 2019-04-15 NOTE — Telephone Encounter (Signed)
Called and spoke to pt. Pt is requesting refill of Promethazine-DM, last filled on 7/13 for 236ml by Aaron Edelman. Pt had a hard time completing her sentences due to her cough. Pt denies any new symptoms. She states she is still doing the Astelin nasal spray, Tessalon, sinus rinses and nothing is helping. Pt states she has an upcoming appt for an upper GI series with her GI doctor.   Aaron Edelman please advise if you are ok filling this medication.

## 2019-04-15 NOTE — Telephone Encounter (Signed)
Spoke with pt and advised rx for promethazine and Codeine sent to [place]. Nothing further is needed.

## 2019-04-15 NOTE — Telephone Encounter (Addendum)
[facility] [place] and cancelled rx for promethazine with codeine and called the [facility] and called in the Promethazine-DM. The last signature was for '55ml prn', per Aaron Edelman ok to do '51ml q6 prn'. This has been called in. Called and spoke to pt. Informed her of the medication. Pt verbalized understanding and denied any further questions or concerns at this time.

## 2019-04-15 NOTE — Telephone Encounter (Signed)
Pt is [facility] [address]  [place]  [id number]  Not the [facility] Location phone # [phone] need ASAP.Hillery Hunter

## 2019-04-15 NOTE — Telephone Encounter (Signed)
Sure ok to refill. Please make sure pt has appt with RB for follow up.   Aaron Edelman

## 2019-04-26 ENCOUNTER — Ambulatory Visit
Admission: RE | Admit: 2019-04-26 | Discharge: 2019-04-26 | Disposition: A | Discharge status: Home / Self Care | Payer: Medicare Other | Source: Ambulatory Visit | Attending: Gastroenterology | Admitting: Gastroenterology

## 2019-04-26 DIAGNOSIS — R1310 Dysphagia, unspecified: Secondary | ICD-10-CM

## 2019-05-06 ENCOUNTER — Ambulatory Visit: Payer: Medicare Other | Admitting: Emergency Medicine

## 2019-05-20 ENCOUNTER — Other Ambulatory Visit: Payer: Self-pay | Admitting: Pain Medicine

## 2019-05-20 ENCOUNTER — Telehealth: Payer: Self-pay | Admitting: *Deleted

## 2019-05-20 DIAGNOSIS — G894 Chronic pain syndrome: Secondary | ICD-10-CM

## 2019-05-20 MED ORDER — MORPHINE SULFATE ER 30 MG PO TBCR: 30.0000 mg | EXTENDED_RELEASE_TABLET | Freq: Two times a day (BID) | ORAL | 0 refills | Status: DC

## 2019-05-20 NOTE — Telephone Encounter (Signed)
Pt informed that Morphine script has been sent to pharmacy.

## 2019-05-24 ENCOUNTER — Other Ambulatory Visit: Payer: Self-pay | Admitting: Emergency Medicine

## 2019-05-25 ENCOUNTER — Encounter: Payer: Self-pay | Admitting: Emergency Medicine

## 2019-05-25 ENCOUNTER — Ambulatory Visit: Payer: Medicare Other | Admitting: Emergency Medicine

## 2019-05-25 ENCOUNTER — Other Ambulatory Visit: Payer: Self-pay

## 2019-05-25 VITALS — BP 118/84 | HR 95 | Ht 66.0 in | Wt 220.0 lb

## 2019-05-25 DIAGNOSIS — L659 Nonscarring hair loss, unspecified: Secondary | ICD-10-CM | POA: Diagnosis not present

## 2019-05-25 DIAGNOSIS — R053 Chronic cough: Secondary | ICD-10-CM

## 2019-05-25 DIAGNOSIS — R05 Cough: Secondary | ICD-10-CM | POA: Diagnosis not present

## 2019-05-25 LAB — CBC WITH DIFFERENTIAL/PLATELET
Basophils Absolute: 0 10*3/uL (ref 0.0–0.1)
Basophils Relative: 0.4 % (ref 0.0–3.0)
Eosinophils Absolute: 0.2 10*3/uL (ref 0.0–0.7)
Eosinophils Relative: 3.1 % (ref 0.0–5.0)
HCT: 36.6 % (ref 36.0–46.0)
Hemoglobin: 11.7 g/dL — ABNORMAL LOW (ref 12.0–15.0)
Lymphocytes Relative: 34.6 % (ref 12.0–46.0)
Lymphs Abs: 2.5 10*3/uL (ref 0.7–4.0)
MCHC: 32 g/dL (ref 30.0–36.0)
MCV: 84.1 fl (ref 78.0–100.0)
Monocytes Absolute: 0.8 10*3/uL (ref 0.1–1.0)
Monocytes Relative: 11 % (ref 3.0–12.0)
Neutro Abs: 3.6 10*3/uL (ref 1.4–7.7)
Neutrophils Relative %: 50.9 % (ref 43.0–77.0)
Platelets: 299 10*3/uL (ref 150.0–400.0)
RBC: 4.35 Mil/uL (ref 3.87–5.11)
RDW: 16.7 % — ABNORMAL HIGH (ref 11.5–15.5)
WBC: 7.1 10*3/uL (ref 4.0–10.5)

## 2019-05-25 LAB — TSH: TSH: 3.2 u[IU]/mL (ref 0.35–4.50)

## 2019-05-25 NOTE — Patient Instructions (Addendum)
We will perform lab work today Please continue loratadine 10 mg daily and Singulair 10 mg each evening You can stop Astelin nasal spray if you are not having significant nasal drainage. Stop chlorpheniramine tabs. Agree with esomeprazole 40 mg twice a day. We will refill your cough syrup and your Tessalon Perles.  Use these for cough suppression as directed We will arrange for bronchoscopy to inspect your airway, obtain bronchial washings to send for culture and markers of inflammation. Follow with Dr Lamonte Sakai in 1 month

## 2019-05-25 NOTE — Assessment & Plan Note (Signed)
This is a new symptom that she reported today.  She is also having skin and mouth dryness.  I will check a screening TSH.

## 2019-05-25 NOTE — Progress Notes (Signed)
Subjective:    Patient ID: Kari Gibbs, female    DOB: 03-13-1951, 68 y.o.   MRN: [id number]  HPI 68 year old never smoker with a history of gastric bypass (2011) with diverticulum, subsequent surgeries to adjust afterwards, suspected GERD, hypertension, IBS. She has chronic abd pain from this. She notes that she was hospitalized as a child for PNA. She has also had intermittent nasal congestion and flares of bronchitis, more bothersome   She is referred today for evaluation of chronic cough that started about a month ago, associated with nasal congestion, barking cough. Treated with abx, didn't take the pred that was prescribed. Was started on nasal steroid, singulair. Most recently she was given levaquin based on the CXR below.   She is on omeprazole 71m bid, but she has breakthrough reflux. She sometimes has dysphagia, but no overt aspiration sx.   [facility].  This showed grossly normal airflows with a normal flow volume loop.  A chest x-ray done on 06/04/2018 was reviewed, shows question of some underlying chronic lung disease with bilateral lower lobe atelectasis versus scar   ROV 07/13/18 --this is a follow-up visit for evaluation of chronic cough.  She has some associated nasal congestion, esophageal reflux.  She also had some basilar interstitial changes on chest x-ray.  We performed a high-resolution CT scan of her chest on 11/14 that I reviewed and which shows some linear scarring and associated bronchiectasis and volume loss in the right middle lobe and lingula, bilateral lower lobes.  There is no overt interstitial lung disease.  At her initial visit I temporarily stop omeprazole and changed her to pantoprazole, continued Singulair, nasal steroid and added loratadine.  We also discussed doing nasal saline rinses. Her cough has been better, she may still have a little reflux present.   ROV 05/25/2019 --68year old woman who  follows up today for history of chronic cough.  She has associated GERD, allergic rhinitis.  CT chest imaging shows some linear scar some right middle lobe and lingular, lower lobe bronchiectatic change. She reports that her GERD is still active, currently on 432mbid nexium. She still has trouble swallowing and getting food down. She has had a GI eval that has been unrevealing so far. She is on loratadine, chlor-tabs, astelin NS, singulair. Poor tolerance prednisone. She still has a lot of throat clearing, globus sensation. She notes a lot of throat dryness, some alopecia.    Review of Systems  Constitutional: Negative for fever and unexpected weight change.  HENT: Negative for congestion, dental problem, ear pain, nosebleeds, postnasal drip, rhinorrhea, sinus pressure, sneezing, sore throat and trouble swallowing.   Eyes: Negative for redness and itching.  Respiratory: Positive for cough and shortness of breath. Negative for chest tightness and wheezing.   Cardiovascular: Negative for palpitations and leg swelling.  Gastrointestinal: Negative for nausea and vomiting.  Genitourinary: Negative for dysuria.  Musculoskeletal: Negative for joint swelling.  Skin: Negative for rash.  Neurological: Negative for headaches.  Hematological: Does not bruise/bleed easily.  Psychiatric/Behavioral: Negative for dysphoric mood. The patient is not nervous/anxious.        Objective:   Physical Exam Vitals:   05/25/19 1456  BP: 118/84  Pulse: 95  SpO2: 95%  Weight: 220 lb (99.8 kg)  Height: _0  (1.676 m)   Gen: Pleasant, well-nourished, in no distress,  normal affect, occasional barking cough  ENT: No lesions,  mouth clear,  oropharynx clear, no postnasal drip  Neck: No JVD, some mild UA noise  Lungs: No use of accessory muscles, no crackles or wheezes.   Cardiovascular: RRR, heart sounds normal, no murmur or gallops, no peripheral edema  Musculoskeletal: No deformities, no cyanosis or  clubbing  Neuro: alert, non focal  Skin: Warm, no lesions or rash      Assessment & Plan:  Chronic cough She does not appear to be having a lot of rhinitis, in fact she states that she is having increased dryness.  I think we can stop the chlor tabs, Astelin, continue the loratadine and Singulair.  She has been on esomeprazole twice a day as per gastroenterology recommendations and I agree with this.  Based on the continued cough, clear upper airway irritation I think she needs an inspection bronchoscopy.  This will also allow [place] to do BAL for microorganisms since she has some mild bronchiectatic change on CT chest.  We will obtain serum and BAL eosinophil counts as well.  We will perform lab work today Please continue loratadine 10 mg daily and Singulair 10 mg each evening You can stop Astelin nasal spray if you are not having significant nasal drainage. Stop chlorpheniramine tabs. Agree with esomeprazole 40 mg twice a day. We will refill your cough syrup and your Tessalon Perles.  Use these for cough suppression as directed We will arrange for bronchoscopy to inspect your airway, obtain bronchial washings to send for culture and markers of inflammation. Follow with Dr Lamonte Sakai in 1 month  Alopecia This is a new symptom that she reported today.  She is also having skin and mouth dryness.  I will check a screening TSH.  Baltazar Apo, MD, PhD 05/25/2019, 5:12 PM [facility] [phone] or if no answer [phone]

## 2019-05-25 NOTE — Assessment & Plan Note (Signed)
She does not appear to be having a lot of rhinitis, in fact she states that she is having increased dryness.  I think we can stop the chlor tabs, Astelin, continue the loratadine and Singulair.  She has been on esomeprazole twice a day as per gastroenterology recommendations and I agree with this.  Based on the continued cough, clear upper airway irritation I think she needs an inspection bronchoscopy.  This will also allow [place] to do BAL for microorganisms since she has some mild bronchiectatic change on CT chest.  We will obtain serum and BAL eosinophil counts as well.  We will perform lab work today Please continue loratadine 10 mg daily and Singulair 10 mg each evening You can stop Astelin nasal spray if you are not having significant nasal drainage. Stop chlorpheniramine tabs. Agree with esomeprazole 40 mg twice a day. We will refill your cough syrup and your Tessalon Perles.  Use these for cough suppression as directed We will arrange for bronchoscopy to inspect your airway, obtain bronchial washings to send for culture and markers of inflammation. Follow with Dr Lamonte Sakai in 1 month

## 2019-05-26 ENCOUNTER — Telehealth: Payer: Self-pay | Admitting: Emergency Medicine

## 2019-05-26 DIAGNOSIS — R053 Chronic cough: Secondary | ICD-10-CM

## 2019-05-26 DIAGNOSIS — R05 Cough: Secondary | ICD-10-CM

## 2019-05-26 MED ORDER — BENZONATATE 200 MG PO CAPS: 200.0000 mg | ORAL_CAPSULE | Freq: Four times a day (QID) | ORAL | 1 refills | Status: DC | PRN

## 2019-05-26 MED ORDER — PROMETHAZINE-DM 6.25-15 MG/5ML PO SYRP: 5.0000 mL | ORAL_SOLUTION | Freq: Four times a day (QID) | ORAL | 0 refills | Status: DC | PRN

## 2019-05-26 NOTE — Telephone Encounter (Signed)
Called and spoke w/ pt. Pt last seen 05/25/2019, where RB stated the following, We will refill your cough syrup and your Tessalon Perles.  Use these for cough suppression as directed.  According to pt's records, these orders had not been placed. Benzonatate was last filled in February by [facility]. Promethazine-dextropmethorphan was last filled in September with 0 refills. I let pt know we would send an Rx for this medications to her preferred pharmacy. Pt expressed understanding with no additional questions. Orders have been placed. Nothing further needed at this time.

## 2019-06-01 ENCOUNTER — Other Ambulatory Visit: Payer: Self-pay

## 2019-06-01 ENCOUNTER — Telehealth: Payer: Self-pay | Admitting: Emergency Medicine

## 2019-06-01 ENCOUNTER — Encounter (HOSPITAL_COMMUNITY): Payer: Self-pay | Admitting: Emergency Medicine

## 2019-06-01 NOTE — Telephone Encounter (Signed)
Spoke with patient.  covid screen 06/02/19 at 0800 told pt to arrive at [address] stay far right.  Patient voiced understanding, nothing further needed at this time

## 2019-06-01 NOTE — Progress Notes (Signed)
PRE-OP ENDO CALL COMPLETED

## 2019-06-02 ENCOUNTER — Telehealth: Payer: Self-pay | Admitting: Emergency Medicine

## 2019-06-02 ENCOUNTER — Other Ambulatory Visit (HOSPITAL_COMMUNITY)
Admission: RE | Admit: 2019-06-02 | Discharge: 2019-06-02 | Disposition: A | Discharge status: Home / Self Care | Payer: Medicare Other | Source: Ambulatory Visit | Attending: Emergency Medicine | Admitting: Emergency Medicine

## 2019-06-02 DIAGNOSIS — Z01812 Encounter for preprocedural laboratory examination: Secondary | ICD-10-CM | POA: Diagnosis present

## 2019-06-02 DIAGNOSIS — Z20828 Contact with and (suspected) exposure to other viral communicable diseases: Secondary | ICD-10-CM | POA: Diagnosis not present

## 2019-06-02 LAB — SARS CORONAVIRUS 2 (TAT 6-24 HRS): SARS Coronavirus 2: NEGATIVE

## 2019-06-02 NOTE — Telephone Encounter (Signed)
Katie from RT returned call, made aware if we do not have results by closing we would give Dr. Lamonte Sakai a call. Voiced understanding.   Will hold in triage.

## 2019-06-02 NOTE — Telephone Encounter (Signed)
LMTCB with Katie at RT department.   Patient had her covid test this morning at 8:07am. We can hold in triage until 530 and if still not back we may have to call Dr. Lamonte Sakai to advise.

## 2019-06-02 NOTE — Telephone Encounter (Signed)
Will route message to Dr. Lamonte Sakai as Kari Gibbs. Covid test still has not resulted.   FYI Covid test still has not resulted, Bronch is scheduled for 10.22.20.

## 2019-06-03 ENCOUNTER — Ambulatory Visit (HOSPITAL_COMMUNITY): Admission: RE | Admit: 2019-06-03 | Payer: Medicare Other | Source: Ambulatory Visit

## 2019-06-03 NOTE — Telephone Encounter (Signed)
I will work on scheduling when I return to office 10/30, contact her after that

## 2019-06-03 NOTE — Telephone Encounter (Signed)
I called the patient to give her covid test date and she said she did not no about the new appt and she does not want to go to [facility] will only go to [facility]

## 2019-06-03 NOTE — Telephone Encounter (Signed)
Bronch scheduled for 06/22/2019 at 1230 at [facility]. Will need covid scheduling on 11/07.   Noted. Will route message to RB and his nurse so they can be sure patient is scheduled for covid testing 72 hours prior to bronch per per protocol.

## 2019-06-03 NOTE — Telephone Encounter (Signed)
Dr Lamonte Sakai- bronch is now scheduled for 11/10 at [facility] but now the pt is refusing to do the procedure anywhere but [facility]. Is this okay with you to try and switch it to [facility]?

## 2019-06-03 NOTE — Telephone Encounter (Signed)
Called and spoke to pt. Pt states she will wait for late November/early December to do the bronch at [facility]. Pt states she cannot do 11/12, 11/18, 11/19.  Dr. Lamonte Sakai please advise. Thank you.

## 2019-06-03 NOTE — Telephone Encounter (Signed)
Patient called the office upset stating she does not understand how we missed scheduling her covid test to begin with. I apologized to her many times over and made her aware that we are working diligently to get her R/S for her bronch. She states she is not available until after 11/4. I made her aware RB is aware of this already and we would work on this. Patient was appreciative of the call.   LMTCB with Katie to schedule Bronch.

## 2019-06-03 NOTE — Telephone Encounter (Signed)
Katie returned call she is going to confirm Bronch dates and call back.

## 2019-06-03 NOTE — Telephone Encounter (Signed)
We will work on rescheduling. She is available after 11/4

## 2019-06-03 NOTE — Telephone Encounter (Signed)
Routing message to the [facility] pool as they are the ones scheduling COVID testing.

## 2019-06-03 NOTE — Telephone Encounter (Signed)
Received a call from [facility] with RT. She states respiratory and RB have availability on 11/9 and 11/10. Will need to find out from pt if she can also be available on one of those days to reschedule the bronch. LMTCB for pt. Seems like the best day might be 11/10 to allow for enough time for the COVID test to come back prior to the procedure.   Will need to contact Katie with RT after speaking with pt.

## 2019-06-03 NOTE — Telephone Encounter (Signed)
I can't do it at [place] that day - I'm rounding at [facility]. If she would prefer to cancel then we can defer it to late Nov or early December > should be able to do it at [place] then.

## 2019-06-11 NOTE — Telephone Encounter (Signed)
Called spoke with patient - she refuses to have procedure at [facility]; needs to be at [facility] only.  Looked at [employer] schedule in [place] - he is [facility] rounder Wednesday 12/2, Thursday 12/3 and Friday 12/4.  Patient is available all 3 dates.  Called to schedule with Katie @ [phone] - she will need to check the schedule and call me back.  My direct line was given.

## 2019-06-11 NOTE — Telephone Encounter (Signed)
Pt returning missed call and would like a call back 

## 2019-06-11 NOTE — Telephone Encounter (Signed)
LMTCB

## 2019-06-11 NOTE — Telephone Encounter (Signed)
Katie called back >> Dec 2 @ 0730 at [facility].  Will need to have COVID test scheduled.  Dr Lamonte Sakai will this date work for you?  You are rounding at [facility] per qgenda.  Thank you.

## 2019-06-11 NOTE — Telephone Encounter (Signed)
It looks like this has been scheduled for 06/22/2019 - is this still correct or do we need to reschedule? If it doesn't work for the patient then we can find a different time.

## 2019-06-15 ENCOUNTER — Other Ambulatory Visit: Payer: Self-pay | Admitting: Emergency Medicine

## 2019-06-15 ENCOUNTER — Encounter: Payer: Self-pay | Admitting: Pain Medicine

## 2019-06-15 NOTE — Telephone Encounter (Signed)
Patient returned call - patient is happy with the 12.2.2020 bronch date and location.  COVID testing scheduled for 11.30.2020.  Dr Lamonte Sakai, patient is currently scheduled to see you on 11.30.2020 for 1 month follow up and she feels that this appt will be moot 2 days from the [place] and would like permission to cancel.  Please advise, thank you.

## 2019-06-15 NOTE — Telephone Encounter (Signed)
atc patient unable to reach, left message to call office back

## 2019-06-15 NOTE — Telephone Encounter (Signed)
lmtcb for pt. Reschedule appt for late December/early January.

## 2019-06-15 NOTE — Telephone Encounter (Signed)
Yes lets reschedule for about a month after the bronch

## 2019-06-15 NOTE — Telephone Encounter (Signed)
Yes this works for me

## 2019-06-16 ENCOUNTER — Other Ambulatory Visit: Payer: Self-pay

## 2019-06-16 ENCOUNTER — Ambulatory Visit: Payer: Medicare Other | Attending: Pain Medicine | Admitting: Pain Medicine

## 2019-06-16 DIAGNOSIS — G8929 Other chronic pain: Secondary | ICD-10-CM

## 2019-06-16 DIAGNOSIS — M5414 Radiculopathy, thoracic region: Secondary | ICD-10-CM

## 2019-06-16 DIAGNOSIS — M792 Neuralgia and neuritis, unspecified: Secondary | ICD-10-CM

## 2019-06-16 DIAGNOSIS — K5903 Drug induced constipation: Secondary | ICD-10-CM

## 2019-06-16 DIAGNOSIS — G894 Chronic pain syndrome: Secondary | ICD-10-CM | POA: Diagnosis not present

## 2019-06-16 DIAGNOSIS — R109 Unspecified abdominal pain: Secondary | ICD-10-CM

## 2019-06-16 MED ORDER — MORPHINE SULFATE ER 30 MG PO TBCR: 30.0000 mg | EXTENDED_RELEASE_TABLET | Freq: Two times a day (BID) | ORAL | 0 refills | Status: DC

## 2019-06-16 MED ORDER — DICLOFENAC SODIUM 1 % TD GEL: 4.0000 g | Freq: Four times a day (QID) | TRANSDERMAL | 7 refills | Status: DC | PRN

## 2019-06-16 MED ORDER — GABAPENTIN 400 MG PO CAPS: 400.0000 mg | ORAL_CAPSULE | Freq: Two times a day (BID) | ORAL | 1 refills | Status: DC

## 2019-06-16 NOTE — Telephone Encounter (Signed)
Spoke with pt. She would like to make her appointment with RB in January as she is going to be out of town for Christmas through [place]. Advised her that RB's schedule for January is not available yet. I have placed a recall so that we can keep track of when she needs an appointment.

## 2019-06-16 NOTE — Progress Notes (Signed)
Pain Management Virtual Encounter Note - Virtual Visit via Telephone Telehealth (real-time audio visits between healthcare provider and patient).   Patient's Phone No. & Preferred Pharmacy:  [phone] (home); [phone] (mobile); (Preferred) [phone] [email or web address]  [id number] - [place], [place] - [address] AT [address] [place] [address] Phone: [phone] Fax: [phone]  [place], [facility] [address] [place] [address] [place] [address] Phone: [phone] Fax: [phone]    Pre-screening note:  Our staff contacted Kari Gibbs and offered her an "in person", "face-to-face" appointment versus a telephone encounter. She indicated preferring the telephone encounter, at this time.   Reason for Virtual Visit: COVID-19*  Social distancing based on CDC and AMA recommendations.   I contacted Kari Gibbs on 06/16/2019 via telephone.      I clearly identified myself as Gaspar Cola, MD. I verified that I was speaking with the correct person using two identifiers (Name: Kari Gibbs, and date of birth: 1951-05-19).  Advanced Informed Consent I sought verbal advanced consent from Kari Gibbs for virtual visit interactions. I informed Kari Gibbs of possible security and privacy concerns, risks, and limitations associated with providing "not-in-person" medical evaluation and management services. I also informed Kari Gibbs of the availability of "in-person" appointments. Finally, I informed her that there would be a charge for the virtual visit and that she could be  personally, fully or partially, financially responsible for it. Kari Gibbs expressed understanding and agreed to proceed.   Historic Elements   Kari Gibbs is a 68 y.o. year old, female patient evaluated today after her last encounter by our practice on 05/20/2019. 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 Kari Gibbs has a current medication list which includes the following prescription(s): benzonatate, buspirone, cephalexin, vitamin d3, gabapentin, irbesartan, vitron-c, loratadine, magnesium, montelukast, morphine, morphine, morphine, naloxone, nifedipine, prenatal vit-fe fum-fa-omega, align, promethazine-dextromethorphan, flutter, venlafaxine xr, vitamin b-12, benefiber, and diclofenac sodium. She  reports that she has never smoked. She has never used smokeless tobacco. She reports previous alcohol use. She reports that she does not use drugs. Kari Gibbs is allergic to iohexol; ace inhibitors; losartan potassium; and neosporin [neomycin-bacitracin zn-polymyx].   HPI  Today, she is being contacted for medication management.  The patient indicates doing well with the current medication regimen. No adverse reactions or side effects reported to the medications.   Pharmacotherapy Assessment  Analgesic: MS Contin 30 mg, 1 tab PO q 12 hrs (60 mg/day of morphine) MME/day:60 mg/day.    Monitoring: Pharmacotherapy: No side-effects or adverse reactions reported. [place] PMP: PDMP reviewed during this encounter.       Compliance: No problems identified. Effectiveness: Clinically acceptable. Plan: Refer to "POC".  UDS:  Summary  Date Value Ref Range Status  09/07/2018 FINAL  Final  Comment:    ==================================================================== TOXASSURE SELECT 13 (MW) ==================================================================== Test                             Result       Flag       Units Drug Present and Declared for Prescription Verification   Morphine                       >7519        EXPECTED   ng/mg creat   Normorphine                    1265         EXPECTED   ng/mg creat    Potential sources of large amounts of morphine in the absence of    codeine include administration of morphine or use of heroin.    Normorphine is an expected metabolite of morphine.   Hydromorphone                  347          EXPECTED   ng/mg creat    Hydromorphone may be present as a metabolite of morphine;    concentrations of hydromorphone rarely exceed 5% of the morphine    concentration when this is the source of hydromorphone. ==================================================================== Test                      Result    Flag   Units      Ref Range   Creatinine              133              mg/dL      >=20 ==================================================================== Declared Medications:  The flagging and interpretation on this report are based on the  following declared medications.  Unexpected results may arise from  inaccuracies in the declared medications.  **Note: The testing scope of this panel includes these medications:  Morphine (MS Contin)  **Note: The testing scope of this panel does not include following  reported medications:  Azelastine (Astelin)  Benzonatate (Tessalon)  Buspirone (BuSpar)  Cyanocobalamin   Dextromethorphan (Prometh with Dextromethorphan)  Estrogen (Premarin)  Gabapentin  Irbesartan (Avapro)  Magnesium  Montelukast (Singulair)  Multivitamin (Prenatal Vitamin)  Naloxone (Narcan)  Pantoprazole (Protonix)  Promethazine (Prometh with Dextromethorphan)  Supplement  Supplement (Probiotic)  Topical Diclofenac  Vitamin D3 ==================================================================== For clinical consultation, please call [phone]. ====================================================================    Laboratory Chemistry Profile (12 mo)  Renal: No results found for requested labs within last 8760 hours.  Lab Results  Component Value Date   GFRAA >60 01/04/2016   GFRNONAA >60 01/04/2016   Hepatic: No results found for requested labs within last 8760 hours. Lab Results  Component Value Date   AST 33 01/04/2016   ALT 26 01/04/2016   Other: No results found for requested labs within last 8760 hours. Note: Above Lab results reviewed.  Imaging  Last 90 days:  Dg Paulene Floor Small Bowel  Result Date: 04/26/2019 CLINICAL DATA:  Dysphagia. EXAM: UPPER GI SERIES WITH KUB TECHNIQUE: After obtaining a scout radiograph a routine upper GI series was performed using thin and high density barium. FLUOROSCOPY TIME:  Fluoroscopy Time:  2 minutes 24 seconds Radiation Exposure Index (if provided by the fluoroscopic device): 627 mGy Number of Acquired Spot Images: 4 COMPARISON:  None. FINDINGS: Scout view of the abdomen shows a fair amount of stool in the colon. Bowel gas pattern is otherwise unremarkable. Single contrast examination of the upper gastrointestinal tract shows decreased esophageal motility with tertiary contractions. No esophageal fold thickening, stricture or obstruction. Postoperative changes of gastric bypass. Small bowel, including terminal ileum, within normal limits. IMPRESSION: 1. Esophageal dysmotility. 2. 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  Electronically Signed   By: Lorin Picket M.D.   On: 04/26/2019 11:22    Assessment  The primary encounter diagnosis was Chronic pain syndrome. Diagnoses of Chronic flank pain (Primary Area of Pain) (Left), Chronic thoracic radicular pain (Left-sided), Neurogenic pain, and Opioid-induced constipation (OIC) were also pertinent to this visit.  Plan of Care  I have discontinued Janeeka Puhl pantoprazole and esomeprazole. I have also changed her diclofenac sodium. Additionally, I am having her start on morphine and morphine. Lastly, I am having her maintain her Align, PRENATAL VIT-FE FUM-FA-OMEGA PO, Magnesium, busPIRone, vitamin B-12, Vitamin D3, naloxone, irbesartan, Flutter, venlafaxine XR, Benefiber, montelukast, loratadine, promethazine-dextromethorphan, benzonatate, NIFEdipine, Vitron-C, cephALEXin, gabapentin, and morphine.  Pharmacotherapy (Medications Ordered): Meds ordered this encounter  Medications  . diclofenac sodium (VOLTAREN) 1 % GEL    Sig: Apply 4 g topically 4 (four) times daily as needed.    Dispense:  350 g    Refill:  7    Fill one day early if pharmacy is closed on scheduled refill date. May substitute for generic if available.  . gabapentin (NEURONTIN) 400 MG capsule    Sig: Take 1 capsule (400 mg total) by mouth 2 (two) times daily.    Dispense:  180 capsule    Refill:  1    Fill one day early if pharmacy is closed on scheduled refill date. May substitute for generic if available.  . morphine (MS CONTIN) 30 MG 12 hr tablet    Sig: Take 1 tablet (30 mg total) by mouth every 12 (twelve) hours. Must last 30 days    Dispense:  60 tablet    Refill:  0    Chronic Pain: STOP Act (Not applicable) Fill 1 day early if closed on refill date. Do not fill until: 06/19/2019. To last until: 07/19/2019. Avoid benzodiazepines within 8 hours of opioids  . morphine (MS CONTIN) 30 MG 12 hr tablet    Sig: Take 1 tablet (30 mg total) by mouth every 12 (twelve) hours. Must last 30 days     Dispense:  60 tablet    Refill:  0    Chronic Pain: STOP Act (Not applicable) Fill 1 day early if closed on refill date. Do not fill until: 07/19/2019. To last until: 08/18/2019. Avoid benzodiazepines within 8 hours of opioids  . morphine (MS CONTIN) 30 MG 12 hr tablet    Sig: Take 1 tablet (30 mg total) by mouth every 12 (twelve) hours. Must last 30 days    Dispense:  60 tablet    Refill:  0    Chronic Pain: STOP Act (Not applicable) Fill 1 day early if closed on refill date. Do not fill until: 08/18/2019. To last until: 09/17/2019. Avoid benzodiazepines within 8 hours of opioids   Orders:  No orders of the defined types were placed in this encounter.  Follow-up plan:   Return in about 13 weeks (around 09/15/2019) for (VV), (MM).     Interventional management options:  Considering:   Diagnosticleft costochondral joint injection  Possible left intercostal nerve RFA  Therapeutic Left greater occipital nerve RFA Diagnostic  left C2 + TON nerve block Possible left C2 + TON RFA    Palliative PRN treatment(s):   Palliative left intercostal nerve blocks (T5 to T8) #2  Palliative left GONB block #3  Palliative bilateral sacroiliac joint block #2     Recent Visits No visits were found meeting these conditions.  Showing recent visits within past 90 days and meeting all other requirements   Today's Visits Date Type Provider Dept  06/16/19 Telemedicine [name], MD [facility]  Showing today's visits and meeting all other requirements   Future Appointments No visits were found meeting these conditions.  Showing future appointments within next 90 days and meeting all other requirements   I discussed the assessment and treatment plan with the patient. The patient was provided an opportunity to ask questions and all were answered. The patient agreed with the plan and demonstrated an understanding of the instructions.  Patient advised to call back or seek an in-person  evaluation if the symptoms or condition worsens.  Total duration of non-face-to-face encounter: 18 minutes.  Note by: Gaspar Cola, MD Date: 06/16/2019; Time: 12:26 PM  Note: This dictation was prepared with Dragon dictation. Any transcriptional errors that may result from this process are unintentional.  Disclaimer:  * Given the special circumstances of the COVID-19 pandemic, the federal government has announced that the Office for Civil Rights (OCR) will exercise its enforcement discretion and will not impose penalties on physicians using telehealth in the event of noncompliance with regulatory requirements under the [place] (HIPAA) in connection with the good faith provision of telehealth during the XX123456 national public health emergency. ([place])

## 2019-06-18 ENCOUNTER — Telehealth: Payer: Self-pay | Admitting: Emergency Medicine

## 2019-06-18 NOTE — Telephone Encounter (Signed)
Pt aware of new appt time

## 2019-06-22 ENCOUNTER — Encounter (HOSPITAL_COMMUNITY): Payer: Medicare Other

## 2019-06-24 ENCOUNTER — Telehealth: Payer: Self-pay | Admitting: Emergency Medicine

## 2019-06-24 DIAGNOSIS — R05 Cough: Secondary | ICD-10-CM

## 2019-06-24 DIAGNOSIS — R053 Chronic cough: Secondary | ICD-10-CM

## 2019-06-24 MED ORDER — PROMETHAZINE-DM 6.25-15 MG/5ML PO SYRP: 5.0000 mL | ORAL_SOLUTION | Freq: Four times a day (QID) | ORAL | 0 refills | Status: DC | PRN

## 2019-06-24 NOTE — Telephone Encounter (Signed)
Called and spoke with pt who is requesting a refill of her promethazine cough syrup.  Pt is scheduled to have a bronch 12/2 but said until she has the bronch, she needs more promethazine to help with her cough.  Tammy, please advise if you are okay refilling med for pt or if you want [place] to defer this to Dr. Lamonte Sakai? Thanks!

## 2019-06-24 NOTE — Telephone Encounter (Signed)
Spoke with patient. She is aware that TP has ok'ed her cough syrup. Will go ahead and send in RX. Nothing further needed at time of call.

## 2019-06-24 NOTE — Telephone Encounter (Signed)
That is fine to refill promethazine cough syrup , refill x 1 .  Has no narcotics in it .   Cont w/ ov recs Please contact office for sooner follow up if symptoms do not improve or worsen or seek emergency care

## 2019-07-12 ENCOUNTER — Other Ambulatory Visit: Payer: Self-pay | Admitting: Family Medicine

## 2019-07-12 ENCOUNTER — Other Ambulatory Visit (HOSPITAL_COMMUNITY): Payer: Medicare Other

## 2019-07-12 ENCOUNTER — Ambulatory Visit: Payer: Medicare Other | Admitting: Emergency Medicine

## 2019-07-12 ENCOUNTER — Telehealth: Payer: Self-pay | Admitting: Emergency Medicine

## 2019-07-12 ENCOUNTER — Encounter (HOSPITAL_COMMUNITY): Payer: Self-pay

## 2019-07-12 ENCOUNTER — Other Ambulatory Visit (HOSPITAL_COMMUNITY)
Admission: RE | Admit: 2019-07-12 | Discharge: 2019-07-12 | Disposition: A | Discharge status: Home / Self Care | Payer: Medicare Other | Source: Ambulatory Visit | Attending: Emergency Medicine | Admitting: Emergency Medicine

## 2019-07-12 DIAGNOSIS — Z01812 Encounter for preprocedural laboratory examination: Secondary | ICD-10-CM | POA: Insufficient documentation

## 2019-07-12 DIAGNOSIS — Z20828 Contact with and (suspected) exposure to other viral communicable diseases: Secondary | ICD-10-CM | POA: Diagnosis not present

## 2019-07-12 DIAGNOSIS — M810 Age-related osteoporosis without current pathological fracture: Secondary | ICD-10-CM

## 2019-07-12 LAB — SARS CORONAVIRUS 2 (TAT 6-24 HRS): SARS Coronavirus 2: NEGATIVE

## 2019-07-12 NOTE — Progress Notes (Signed)
Spoke with: Left voicemail unable to reach pt at this time Arrival time: 0615 AM NPO and AM medications: per surgeon's instructions given at office Pre op orders: No orders in epic Ride home: Instructed to have a ride home and recommended someone stay with her for 24 hours post op

## 2019-07-12 NOTE — Telephone Encounter (Signed)
I called and spoke with patient and advised her to not eat or drink anything after midnight the night before and she can take her blood pressure medication with a sip of water the morning of. She was already aware of what time she needed to arrive. Nothing further is needed at this time.

## 2019-07-14 ENCOUNTER — Ambulatory Visit (HOSPITAL_COMMUNITY)
Admission: RE | Admit: 2019-07-14 | Discharge: 2019-07-14 | Disposition: A | Discharge status: Home / Self Care | Payer: Medicare Other | Attending: Emergency Medicine | Admitting: Emergency Medicine

## 2019-07-14 ENCOUNTER — Encounter (HOSPITAL_COMMUNITY)
Admission: RE | Disposition: A | Discharge status: Home / Self Care | Payer: Self-pay | Source: Home / Self Care | Attending: Emergency Medicine

## 2019-07-14 ENCOUNTER — Other Ambulatory Visit: Payer: Self-pay

## 2019-07-14 ENCOUNTER — Encounter (HOSPITAL_COMMUNITY): Payer: Self-pay | Admitting: Emergency Medicine

## 2019-07-14 ENCOUNTER — Ambulatory Visit (HOSPITAL_COMMUNITY)
Admission: RE | Admit: 2019-07-14 | Discharge: 2019-07-14 | Disposition: A | Discharge status: Home / Self Care | Payer: Medicare Other | Source: Ambulatory Visit | Attending: Emergency Medicine | Admitting: Emergency Medicine

## 2019-07-14 DIAGNOSIS — Z90710 Acquired absence of both cervix and uterus: Secondary | ICD-10-CM | POA: Diagnosis not present

## 2019-07-14 DIAGNOSIS — Z9884 Bariatric surgery status: Secondary | ICD-10-CM | POA: Insufficient documentation

## 2019-07-14 DIAGNOSIS — R05 Cough: Secondary | ICD-10-CM | POA: Diagnosis not present

## 2019-07-14 DIAGNOSIS — R55 Syncope and collapse: Secondary | ICD-10-CM | POA: Insufficient documentation

## 2019-07-14 DIAGNOSIS — K219 Gastro-esophageal reflux disease without esophagitis: Secondary | ICD-10-CM | POA: Diagnosis not present

## 2019-07-14 DIAGNOSIS — G894 Chronic pain syndrome: Secondary | ICD-10-CM | POA: Insufficient documentation

## 2019-07-14 DIAGNOSIS — R918 Other nonspecific abnormal finding of lung field: Secondary | ICD-10-CM | POA: Insufficient documentation

## 2019-07-14 DIAGNOSIS — K589 Irritable bowel syndrome without diarrhea: Secondary | ICD-10-CM | POA: Diagnosis not present

## 2019-07-14 DIAGNOSIS — J984 Other disorders of lung: Secondary | ICD-10-CM | POA: Insufficient documentation

## 2019-07-14 DIAGNOSIS — J384 Edema of larynx: Secondary | ICD-10-CM | POA: Diagnosis not present

## 2019-07-14 DIAGNOSIS — I10 Essential (primary) hypertension: Secondary | ICD-10-CM | POA: Diagnosis not present

## 2019-07-14 DIAGNOSIS — J310 Chronic rhinitis: Secondary | ICD-10-CM | POA: Diagnosis not present

## 2019-07-14 DIAGNOSIS — Z820 Family history of epilepsy and other diseases of the nervous system: Secondary | ICD-10-CM | POA: Diagnosis not present

## 2019-07-14 DIAGNOSIS — Z8719 Personal history of other diseases of the digestive system: Secondary | ICD-10-CM | POA: Insufficient documentation

## 2019-07-14 DIAGNOSIS — J479 Bronchiectasis, uncomplicated: Secondary | ICD-10-CM | POA: Diagnosis not present

## 2019-07-14 DIAGNOSIS — Z8249 Family history of ischemic heart disease and other diseases of the circulatory system: Secondary | ICD-10-CM | POA: Diagnosis not present

## 2019-07-14 DIAGNOSIS — Z87442 Personal history of urinary calculi: Secondary | ICD-10-CM | POA: Insufficient documentation

## 2019-07-14 DIAGNOSIS — M1990 Unspecified osteoarthritis, unspecified site: Secondary | ICD-10-CM | POA: Diagnosis not present

## 2019-07-14 DIAGNOSIS — J9809 Other diseases of bronchus, not elsewhere classified: Secondary | ICD-10-CM | POA: Insufficient documentation

## 2019-07-14 DIAGNOSIS — Z9049 Acquired absence of other specified parts of digestive tract: Secondary | ICD-10-CM | POA: Diagnosis not present

## 2019-07-14 DIAGNOSIS — F419 Anxiety disorder, unspecified: Secondary | ICD-10-CM | POA: Diagnosis not present

## 2019-07-14 DIAGNOSIS — Z888 Allergy status to other drugs, medicaments and biological substances status: Secondary | ICD-10-CM | POA: Insufficient documentation

## 2019-07-14 DIAGNOSIS — F329 Major depressive disorder, single episode, unspecified: Secondary | ICD-10-CM | POA: Insufficient documentation

## 2019-07-14 DIAGNOSIS — R053 Chronic cough: Secondary | ICD-10-CM | POA: Diagnosis present

## 2019-07-14 HISTORY — PX: VIDEO BRONCHOSCOPY: SHX5072

## 2019-07-14 HISTORY — DX: Chronic pain syndrome: G89.4

## 2019-07-14 LAB — BODY FLUID CELL COUNT WITH DIFFERENTIAL
Lymphs, Fluid: 11 %
Monocyte-Macrophage-Serous Fluid: 22 % — ABNORMAL LOW (ref 50–90)
Neutrophil Count, Fluid: 67 % — ABNORMAL HIGH (ref 0–25)
Total Nucleated Cell Count, Fluid: 113 cu mm (ref 0–1000)

## 2019-07-14 SURGERY — VIDEO BRONCHOSCOPY WITHOUT FLUORO
Anesthesia: Moderate Sedation | Laterality: Bilateral

## 2019-07-14 MED ORDER — MIDAZOLAM HCL (PF) 5 MG/ML IJ SOLN
INTRAMUSCULAR | Status: AC
  Filled 2019-07-14: qty 2

## 2019-07-14 MED ORDER — LIDOCAINE HCL URETHRAL/MUCOSAL 2 % EX GEL: 1.0000 "application " | Freq: Once | CUTANEOUS | Status: DC

## 2019-07-14 MED ORDER — FENTANYL CITRATE (PF) 100 MCG/2ML IJ SOLN
INTRAMUSCULAR | Status: DC | PRN
  Administered 2019-07-14: 50 ug via INTRAVENOUS
  Administered 2019-07-14 (×2): 25 ug via INTRAVENOUS

## 2019-07-14 MED ORDER — SODIUM CHLORIDE 0.9 % IV SOLN
INTRAVENOUS | Status: DC
  Administered 2019-07-14: 08:00:00 via INTRAVENOUS

## 2019-07-14 MED ORDER — MIDAZOLAM HCL (PF) 10 MG/2ML IJ SOLN
INTRAMUSCULAR | Status: DC | PRN
  Administered 2019-07-14: 1 mg via INTRAVENOUS
  Administered 2019-07-14: 2 mg via INTRAVENOUS
  Administered 2019-07-14: 1 mg via INTRAVENOUS

## 2019-07-14 MED ORDER — FENTANYL CITRATE (PF) 100 MCG/2ML IJ SOLN
INTRAMUSCULAR | Status: AC
  Filled 2019-07-14: qty 4

## 2019-07-14 MED ORDER — PHENYLEPHRINE HCL 0.25 % NA SOLN
NASAL | Status: DC | PRN
  Administered 2019-07-14: 2 via NASAL

## 2019-07-14 MED ORDER — LIDOCAINE HCL 1 % IJ SOLN
INTRAMUSCULAR | Status: DC | PRN
  Administered 2019-07-14: 6 mL via RESPIRATORY_TRACT

## 2019-07-14 MED ORDER — PHENYLEPHRINE HCL 0.25 % NA SOLN: 1.0000 | Freq: Four times a day (QID) | NASAL | Status: DC | PRN

## 2019-07-14 MED ORDER — LIDOCAINE HCL URETHRAL/MUCOSAL 2 % EX GEL
CUTANEOUS | Status: DC | PRN
  Administered 2019-07-14: 1

## 2019-07-14 NOTE — Progress Notes (Signed)
Video Bronchoscope Intervention Bronchial washing Procedure tolerated well

## 2019-07-14 NOTE — Consult Note (Signed)
Reason for Consult:chronic cough  Kari Gibbs is an 67 y.o. female.  HPI:  68 yo woman whom I have followed in office for chronic cough in setting GERD, rhinitis. The cough has persisted despite treatment of both. She has some RML and lingular btx and linear scar. No PFT yet. She presents now for further eval, bronchoscopy.    Past Medical History:  Diagnosis Date  . Abdominal pain   . Anxiety   . Arthritis    OA LOWER SPINE  . Chronic pain syndrome   . Depression    MOSTLY IN THE FALL OF THE YEAR  . 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 Fatigue   . Gastritis    Distal gastritis  . GERD (gastroesophageal reflux disease)    "told that she does"  . Headache(784.0)    HX OF MIGRAINES  . Hiatal hernia 09/2009  . History of kidney stones   . History of rib fracture (Left 4th and 8th rib) 06/28/2015  . Hypertension   . IBS (irritable bowel syndrome)   . Nausea & vomiting   . OSTEOARTHRITIS 05/01/2010   Qualifier: Diagnosis of  By: Megan Salon MD, John    . Pneumonia    WHEN PT IN 6 TH GRADE  . SBO (small bowel obstruction) ([place]) 2013  . Varicose veins     Past Surgical History:  Procedure Laterality Date  . ABDOMINAL HYSTERECTOMY  1988   partial  . CHOLECYSTECTOMY N/A 05/18/2013   Procedure: LAPAROSCOPIC CHOLECYSTECTOMY WITH INTRAOPERATIVE CHOLANGIOGRAM/laparoscopy/ ;  Surgeon: Pedro Earls, MD;  Location: [facility];  Service: General;  Laterality: N/A;  . ENDOVENOUS ABLATION SAPHENOUS VEIN W/ LASER Left 02/04/2018   endovenous laser ablation left greater saphenous vein by Tinnie Gens MD   . ESOPHAGEAL MANOMETRY N/A 04/05/2019   Procedure: ESOPHAGEAL MANOMETRY (EM);  Surgeon: Wilford Corner, MD;  Location: [facility];  Service: Endoscopy;  Laterality: N/A;  . EXPLORATORY LAPAROTOMY  2012  . GASTRIC BYPASS  11/13/2009  . HERNIA REPAIR  2011   hiatal  . 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 SHOULDER SURGERY Right 06/16/2017   torn rotator cuff and rebuilding of another section, removed some spurs  . TMJ- LEFT--ABOUT 10 YRS AGO--LEFT SIDE--PT NOW HAS SOME PAIN LEFT JAW Left    still has pain, scar tissue has been removed    Family History  Problem Relation Age of Onset  . Hypertension Mother   . Alzheimer's disease Father     Social History:  reports that she has never smoked. She has never used smokeless tobacco. She reports previous alcohol use. She reports that she does not use drugs.  Allergies:  Allergies  Allergen Reactions  . Iohexol Anaphylaxis, Hives, Shortness Of Breath and Swelling    (IOHEXOL = Omnipaque) (Radiological Contrast) Code: HIVES, Desc: Pt has had a prior IVP dye reaction,(no scans in health system).Symptoms were hives,and airway obstruction!, Onset Date: 09/29/1999   . Ace Inhibitors     REACTION: cyclic cough  . Losartan Potassium Other (See Comments)    Raises blood pressure; fatigue  . Neosporin [Neomycin-Bacitracin Zn-Polymyx] Dermatitis    Medications: I have reviewed the patient's current medications.  Results for orders placed or performed during the hospital encounter of 07/12/19 (from the past 48 hour(s))  SARS CORONAVIRUS 2 (  TAT 6-24 HRS) Nasopharyngeal Nasopharyngeal Swab     Status: None   Collection Time: 07/12/19 10:10 AM   Specimen: Nasopharyngeal Swab  Result Value Ref Range   SARS Coronavirus 2 NEGATIVE NEGATIVE    Comment: (NOTE) SARS-CoV-2 target nucleic acids are NOT DETECTED. The SARS-CoV-2 RNA is generally detectable in upper and lower respiratory specimens during the acute phase of infection. Negative results do not preclude SARS-CoV-2 infection, do not rule out co-infections with other pathogens, and should not be used as the sole basis for treatment or other patient management decisions. Negative results must  be combined with clinical observations, patient history, and epidemiological information. The expected result is Negative. Fact Sheet for Patients: SugarRoll.be Fact Sheet for Healthcare Providers: https://www.woods-mathews.com/ This test is not yet approved or cleared by the [place] FDA and  has been authorized for detection and/or diagnosis of SARS-CoV-2 by FDA under an Emergency Use Authorization (EUA). This EUA will remain  in effect (meaning this test can be used) for the duration of the COVID-19 declaration under Section 56 4(b)(1) of the Act, 21 U.S.C. section 360bbb-3(b)(1), unless the authorization is terminated or revoked sooner. Performed at [facility], [place] [address] [place],  [address]     No results found.  ROS Blood pressure 122/74, temperature 98.4 F (36.9 C), temperature source Oral, resp. rate 11, height _0  (1.676 m), weight 99.8 kg, SpO2 96 %. Physical Exam Gen: Pleasant, well-nourished, in no distress,  normal affect  ENT: No lesions,  mouth clear,  oropharynx clear, no postnasal drip  Neck: No JVD, no stridor  Lungs: No use of accessory muscles, no crackles or wheezing on normal respiration, no wheeze on forced expiration  Cardiovascular: RRR, heart sounds normal, no murmur or gallops, no peripheral edema  Musculoskeletal: No deformities, no cyanosis or clubbing  Neuro: alert, awake, non focal  Skin: Warm, no lesions or rashes   Assessment/Plan: Chronic cough with btx, in setting GERD, rhinitis  Plan for FOB, inspection, BAL for cx data and cell counts today. Pt understands plan, risks, benefits and agrees to proceed. No barriers identified.   Rose Fillers Dazia Lippold 07/14/2019, 7:44 AM

## 2019-07-14 NOTE — Discharge Instructions (Signed)
Flexible Bronchoscopy, Care After This sheet gives you information about how to care for yourself after your test. Your doctor may also give you more specific instructions. If you have problems or questions, contact your doctor. Follow these instructions at home: Eating and drinking  Do not eat or drink anything (not even water) for 2 hours after your test, or until your numbing medicine (local anesthetic) wears off.  When your numbness is gone and your cough and gag reflexes have come back, you may: ? Eat only soft foods. ? Slowly drink liquids.  The day after the test, go back to your normal diet. Driving  Do not drive for 24 hours if you were given a medicine to help you relax (sedative).  Do not drive or use heavy machinery while taking prescription pain medicine. General instructions   Take over-the-counter and prescription medicines only as told by your doctor.  Return to your normal activities as told. Ask what activities are safe for you.  Do not use any products that have nicotine or tobacco in them. This includes cigarettes and e-cigarettes. If you need help quitting, ask your doctor.  Keep all follow-up visits as told by your doctor. This is important. It is very important if you had a tissue sample (biopsy) taken. Get help right away if:  You have shortness of breath that gets worse.  You get light-headed.  You feel like you are going to pass out (faint).  You have chest pain.  You cough up: ? More than a little blood. ? More blood than before. Summary  Do not eat or drink anything (not even water) for 2 hours after your test, or until your numbing medicine wears off.  Do not use cigarettes. Do not use e-cigarettes.  Get help right away if you have chest pain. This information is not intended to replace advice given to you by your health care provider. Make sure you discuss any questions you have with your health care provider. Document Released: 05/26/2009  Document Revised: 07/11/2017 Document Reviewed: 08/16/2016 Elsevier Patient Education  2020 [employer]. Nothing to eat or drink until   10:00    am today    07/14/2019  Any questions or concerns please call the office at [phone]

## 2019-07-14 NOTE — Op Note (Signed)
[facility] Cardiopulmonary Patient Name: Kari Gibbs Procedure Date: 07/14/2019 MRN: [id number] Attending MD: Collene Gobble , MD Date of Birth: September 17, 1950 CSN: [id number] Age: 68 Admit Type: Ambulatory Ethnicity:  Procedure:             Bronchoscopy Indications:           Chronic cough with abnormal CT Providers:             Collene Gobble, MD, Andre Lefort RRT,RCP, Kelby Fam RRT,RCP Referring MD:           Medicines:             Midazolam 4 mg IV, Fentanyl 100 mcg IV, Lidocaine 1%                         applied to cords 8 mL, Lidocaine 1% applied to the                         tracheobronchial tree 4 mL Complications:         No immediate complications Estimated Blood Loss:  Estimated blood loss: none. Procedure:      Pre-Anesthesia Assessment:      - A History and Physical has been performed. Patient meds and allergies       have been reviewed. The risks and benefits of the procedure and the       sedation options and risks were discussed with the patient. All       questions were answered and informed consent was obtained. Patient       identification and proposed procedure were verified prior to the       procedure by the physician in the procedure room. Mental Status       Examination: alert and oriented. Airway Examination: normal       oropharyngeal airway. Respiratory Examination: clear to auscultation. CV       Examination: normal. ASA Grade Assessment: I - A normal healthy patient.       After reviewing the risks and benefits, the patient was deemed in       satisfactory condition to undergo the procedure. The anesthesia plan was       to use moderate sedation / analgesia (conscious sedation). Immediately       prior to administration of medications, the patient was re-assessed for       adequacy to receive sedatives. The heart rate, respiratory rate, oxygen       saturations, blood pressure, adequacy of  pulmonary ventilation, and       response to care were monitored throughout the procedure. The physical       status of the patient was re-assessed after the procedure.      After obtaining informed consent, the bronchoscope was passed under       direct vision. Throughout the procedure, the patient's blood pressure,       pulse, and oxygen saturations were monitored continuously. the BF-H190       ([id number]) Olympus Bronchoscope was introduced through the left nostril       and advanced to the tracheobronchial tree. The procedure was       accomplished with moderate difficulty due to the patient's respiratory       instability (excessive  coughing). The patient tolerated the procedure       fairly well. The total duration of the procedure was 20 minutes. Findings:      Larynx: Laryngeal edema was visualized, most prominently diffusely,       throughout the larynx. The edema is not obstructing the airway.       Hyperplastic changes were found in the larynx. The changes are not       obstructing the airway. GERD findings were visualized.      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 Partially obstructing (about 80% obstructed) dynamic collapse was       found throughout the tracheobronchial tree. There were no endobronchial       lesions, no abnormal secretions. BAL was performed in the RML medial       segment (B5) of the lung and sent for cell count, bacterial culture,       viral smears & culture, and fungal & AFB analysis and cytology. 60 mL of       fluid were instilled. 20 mL were returned. The return was cloudy. Impression:      - Chronic cough with abnormal CT      - Laryngeal edema.      - Hyperplastic changes were seen in the larynx.      - Abnormality suspected to be secondary to gastroesophageal reflux       disease (GERD) was found.      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      - Dynamic collapse was found throughout the tracheobronchial tree.      - Bronchoalveolar lavage was performed from the RML. Moderate Sedation:      Moderate (conscious) sedation was personally administered by the       pulmonologist. The following parameters were monitored: oxygen       saturation, heart rate, blood pressure, respiratory rate, EKG, adequacy       of pulmonary ventilation, and response to care. Total physician       intraservice time was 20 minutes. Recommendation:      - Await culture and cytology results. Procedure Code(s):      --- Professional ---      [phone], Bronchoscopy, rigid or flexible, including fluoroscopic guidance,       when performed; with bronchial alveolar lavage      99152, Moderate sedation services provided by the same physician or       other qualified health care professional performing the diagnostic or       therapeutic service that the sedation supports, requiring the presence       of an independent trained observer to assist in the monitoring of the       patient's level of consciousness and physiological status; initial 15       minutes of intraservice time, patient age 55 years or older Diagnosis Code(s):      --- Professional ---      R05, Cough      R91.8, Other nonspecific abnormal finding of lung field CPT copyright 2019 American Medical Association. All rights reserved. The codes documented in this report are preliminary and upon coder review may  be revised to meet current compliance requirements. Collene Gobble, MD Collene Gobble, MD 07/14/2019 8:27:33 AM Number of Addenda: 0 Scope In: 8:46:65 AM Scope Out: 8:03:03  AM

## 2019-07-15 LAB — ACID FAST SMEAR (AFB, MYCOBACTERIA): Acid Fast Smear: NEGATIVE

## 2019-07-15 LAB — CYTOLOGY - NON PAP

## 2019-07-15 NOTE — H&P (Signed)
  Please refer to the patient's consult note dated 07/14/2019. That was the patient's preprocedure history and physical.  Baltazar Apo, MD, PhD 07/15/2019, 1:59 PM [facility] [phone] or if no answer [phone]

## 2019-07-16 LAB — CULTURE, BAL-QUANTITATIVE W GRAM STAIN
Culture: NORMAL
Gram Stain: NONE SEEN
Special Requests: NORMAL

## 2019-07-19 ENCOUNTER — Telehealth: Payer: Self-pay | Admitting: Emergency Medicine

## 2019-07-19 DIAGNOSIS — R05 Cough: Secondary | ICD-10-CM

## 2019-07-19 DIAGNOSIS — R053 Chronic cough: Secondary | ICD-10-CM

## 2019-07-19 NOTE — Telephone Encounter (Signed)
Pt returning call.  [phone].

## 2019-07-19 NOTE — Telephone Encounter (Signed)
LMTCB

## 2019-07-19 NOTE — Telephone Encounter (Signed)
I called and spoke with the patient.  Dr. Lamonte Sakai, please advise. Patient is also requesting a refill on her cough syrup to take with her on her trip.

## 2019-07-20 NOTE — Telephone Encounter (Signed)
Please let her know that the cultures on her bronchoscopy are still pending, may take a few weeks to be back.  It is ok to refill her cough medication.

## 2019-07-21 NOTE — Telephone Encounter (Signed)
LMTCB x1 for pt.  **Promethazine DM cough syrup can be called in by clinical staff.**

## 2019-07-22 MED ORDER — PROMETHAZINE-DM 6.25-15 MG/5ML PO SYRP: 5.0000 mL | ORAL_SOLUTION | Freq: Four times a day (QID) | ORAL | 0 refills | Status: DC | PRN

## 2019-07-22 NOTE — Telephone Encounter (Signed)
Called spoke with patient, advised of Dr Agustina Caroli recommendations as stated below.  Patient voiced her understanding.    Patient did state that she was on an abx a few week ago for e-coli UTI and asked if this could have anything to do with her chronic cough or cx results.  Patient verified that she was not on abx at the time of the bronchoscopy.  Pt asked that this info be relayed to Dr Lamonte Sakai.  Advised patient that if Dr Lamonte Sakai has recommendations to add we will call her back.

## 2019-07-22 NOTE — Telephone Encounter (Signed)
Please let her know that we are waiting to see if any predominant infectious organisms (bacteria, mycobacteria, etc) grow out that could be contributing to her chronic cough. So far, no evidence to support this, but it takes weeks for the cx to mature.

## 2019-07-22 NOTE — Telephone Encounter (Signed)
Spoke with the pt and notified of recs per Dr Lamonte Sakai  I refilled her cough syrup  She is asking if it's a good idea for her to travel to [place] for the holidays I advised that CDC advises against this at this time due to pandemic  She verbalized understanding  She is now asking what Dr Lamonte Sakai thinks the results will be and what exactly he is looking for when he did the procedure  Please advise thanks

## 2019-07-22 NOTE — Telephone Encounter (Signed)
Thank you. Since we are looking for possible mycobacterial process, I don't believe short term cipro use will impact the cx data.

## 2019-07-23 NOTE — Telephone Encounter (Signed)
Left message for patient to call back  

## 2019-07-26 NOTE — Telephone Encounter (Signed)
Called pt and advised message from the provider. Pt understood and verbalized understanding. Nothing further is needed.    

## 2019-07-27 ENCOUNTER — Telehealth: Payer: Self-pay | Admitting: Emergency Medicine

## 2019-07-27 NOTE — Telephone Encounter (Signed)
lmtcb for pt.  

## 2019-07-28 NOTE — Telephone Encounter (Signed)
Called and spoke to patient. Patient stated that she received a jury summons letter for 08/17/2019.  Patient stated she would like Dr. Agustina Caroli opinion of if she should go or is it possible to get a letter excusing her from jury duty due to her cough and risk of being around other people.  Dr. Lamonte Sakai, please advise. Thank you!

## 2019-07-29 NOTE — Telephone Encounter (Signed)
Called and spoke to pt. Pt states she needs the excusable letter no later than tomorrow morning so she can take the letter to the court house. Pt is requesting the letter to be excused because of her pulmonary disease and COVID. Pt does not want to be in such a high traffic and public area during the pandemic due to risk of contraction. RB is not in clinic today to sign off on letter, will send to APP of the day.   Tammy please advise. Thank you.

## 2019-07-29 NOTE — Telephone Encounter (Signed)
Nothing additional in chart aside from what Rexene Edison, NP, had mentioned. Called and spoke to pt. Informed her of the recs per TP. Pt verbalized understanding and denied any further questions or concerns at this time.

## 2019-07-29 NOTE — Telephone Encounter (Signed)
Pt is wanting to know the status of the letter for the jury summons. She needs to have it by tomorro win order to have it in by the courts date. Please advise CB# [phone]

## 2019-07-29 NOTE — Telephone Encounter (Signed)
I am sorry that that this was presented the day before she needs to turn in. I looked on her chart could not find a dx except chronic cough /bronchitis .  Not sure that will excuse her from [place] duty . She has other chronic medical conditions maybe her PCP will excuse her.   Look to see if I missed any from Dr. Lamonte Sakai  Last note.

## 2019-08-12 LAB — FUNGUS CULTURE WITH STAIN

## 2019-08-12 LAB — FUNGUS CULTURE RESULT

## 2019-08-12 LAB — FUNGAL ORGANISM REFLEX

## 2019-08-16 ENCOUNTER — Encounter: Payer: Self-pay | Admitting: Pulmonary Disease

## 2019-08-16 ENCOUNTER — Telehealth: Payer: Self-pay | Admitting: Emergency Medicine

## 2019-08-16 ENCOUNTER — Other Ambulatory Visit: Payer: Self-pay

## 2019-08-16 ENCOUNTER — Ambulatory Visit (INDEPENDENT_AMBULATORY_CARE_PROVIDER_SITE_OTHER): Payer: Medicare Other | Admitting: Pulmonary Disease

## 2019-08-16 DIAGNOSIS — R05 Cough: Secondary | ICD-10-CM | POA: Diagnosis not present

## 2019-08-16 DIAGNOSIS — J309 Allergic rhinitis, unspecified: Secondary | ICD-10-CM | POA: Diagnosis not present

## 2019-08-16 DIAGNOSIS — R053 Chronic cough: Secondary | ICD-10-CM

## 2019-08-16 DIAGNOSIS — K219 Gastro-esophageal reflux disease without esophagitis: Secondary | ICD-10-CM | POA: Diagnosis not present

## 2019-08-16 DIAGNOSIS — J479 Bronchiectasis, uncomplicated: Secondary | ICD-10-CM

## 2019-08-16 DIAGNOSIS — G894 Chronic pain syndrome: Secondary | ICD-10-CM

## 2019-08-16 MED ORDER — FLUCONAZOLE 200 MG PO TABS: 400.0000 mg | ORAL_TABLET | Freq: Every day | ORAL | 0 refills | Status: DC

## 2019-08-16 MED ORDER — PROMETHAZINE-DM 6.25-15 MG/5ML PO SYRP: 5.0000 mL | ORAL_SOLUTION | Freq: Four times a day (QID) | ORAL | 0 refills | Status: DC | PRN

## 2019-08-16 NOTE — Assessment & Plan Note (Signed)
Plan: Continue follow-up with pain management 

## 2019-08-16 NOTE — Telephone Encounter (Signed)
Called and spoke with Patient.  Patient stated she is seen by Dr. Lamonte Sakai for chronic cough. Patient stated she had bronchoscopy 07/2019, and was hoping Dr. Lamonte Sakai would get results to help her cough. Patient stated she is coughing more than her normal, non productive, denies drainage.  Patient stated she unable to sleep at night, and is getting sob during cough episodes. Patient has been taking Tessalon as prescribed, with little results. Patient has been taking Promethazine DM as prescribed by Dr. Lamonte Sakai. Patient did not want a my chart visit, but agreed to Tele Visit with Aaron Edelman, NP.

## 2019-08-16 NOTE — Assessment & Plan Note (Addendum)
Plan: Continue Claritin daily Continue nasal saline rinses 1-2 times a day Continue lifestyle measures for management of dust mite allergy In case mattress as well as pillowcases Dry pillows once a week and dryer Continue to use air filtration system in the house

## 2019-08-16 NOTE — Assessment & Plan Note (Signed)
Plan: Continue Claritin We will treat Candida empirically Continue PPI Continue Tessalon Perles We will refill cough syrup We will also coordinate 4 to 6-week 30-minute slot with Dr. Melvyn Novas for second opinion of chronic cough

## 2019-08-16 NOTE — Progress Notes (Signed)
Virtual Visit via Telephone Note  I connected with Alette Kataoka on 08/16/19 at  3:00 PM EST by telephone and verified that I am speaking with the correct person using two identifiers.  Location: Patient: Home Provider: [facility] - [address], [place], [place], [place] [address]   I discussed the limitations, risks, security and privacy concerns of performing an evaluation and management service by telephone and the availability of in person appointments. I also discussed with the patient that there may be a patient responsible charge related to this service. The patient expressed understanding and agreed to proceed.  Patient consented to consult via telephone: Yes People present and their role in pt care: Pt     History of Present Illness:  69 year old female never smoker followed in our office for chronic cough  PMH: Gastric bypass (2011 with diverticulum), subsequent gastric surgeries afterwards to adjust, suspected GERD, hypertension, IBS Smoker/ Smoking History: Never smoker Maintenance:  None  Pt of: Dr. Lamonte Sakai  Chief complaint: s/p bronch / candida tropicalis   69 year old female never smoker followed in our office for chronic cough.  Patient status post bronchoscopy on 07/14/2019.  So far AFB is negative, BAL is negative.  Patient does have a history of bronchiectasis.  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  Patient reports that she continues to have worsened cough.  Her cough management is limited as she also works with pain management where she is maintained on gabapentin, morphine.  She typically uses promethazine cough syrup to help with suppressing cough.  Patient denies productive cough, fever, chills, body aches.  Patient does have a loud barking cough over the televisit today.  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, bronchiectasis and volume loss in right middle lobe  09/24/2018 - Cough ROS:  When to the symptoms start: Fall / 2019  How are you today: worse over past 2 days   Have you had fever/sore throat (first 5 to 7 days of URI) or Have you had cough/nasal congestion (10 to 14 days of URI) : dry cough Have you used anything to treat the cough, as anything improved : cough syrup, vaporizer, humidifier  Is it a dry or wet cough: dry cough  Does the cough happen when your breathing or when you breathe out:  out Other any triggers to your cough, or any aggravating factors: rain / dampness, worse when sitting, laying down, dust mites  Daily antihistamine: claritin  GERD treatment: protonix bid  Singulair: yes   Cough checklist (bolded indicates presence):  Adherence, acid reflux, ACE inhibitor, active sinus disease, active smoking, adverse effects of medications (amiodarone/Macrodantin/bb), alpha 1, allergies, aspiration, anxiety, bronchiectasis, congestive heart failure (diastolic)   Social History   Tobacco Use  Smoking Status Never Smoker  Smokeless Tobacco Never Used   Immunization History  Administered Date(s) Administered  . Hepatitis B 04/12/2009  . Influenza, High Dose Seasonal PF 05/12/2018, 05/10/2019  . Influenza-Unspecified 05/12/2013, 05/13/2015  . Pneumococcal-Unspecified 05/12/2013  . Tdap 12/11/2007, 07/01/2017  . Zoster Recombinat (Shingrix) 03/10/2019    Assessment and Plan:  Allergic rhinitis Plan: Continue Claritin daily Continue nasal saline rinses 1-2 times a day Continue lifestyle measures for management of dust mite allergy In case mattress as well as pillowcases Dry pillows once a week and dryer Continue to use air filtration system in the house  Bronchiectasis without complication ([place]) Plan:  Continue flutter valve use  GERD Plan: Continue to follow-up with  gastroenterology Continue Protonix 40 mg twice daily Continue to follow GERD diet  Chronic cough Plan: Continue Claritin We will treat Candida empirically Continue PPI Continue Tessalon Perles We will refill cough syrup We will also coordinate 4 to 6-week 30-minute slot with Dr. Melvyn Novas for second opinion of chronic cough  Chronic pain syndrome Plan: Continue follow-up with pain management  Follow Up Instructions:  Return in about 4 weeks (around 09/13/2019), or if symptoms worsen or fail to improve, for Follow up with Dr. Melvyn Novas - 56mn ov slot .   I discussed the assessment and treatment plan with the patient. The patient was provided an opportunity to ask questions and all were answered. The patient agreed with the plan and demonstrated an understanding of the instructions.   The patient was advised to call back or seek an in-person evaluation if the symptoms worsen or if the condition fails to improve as anticipated.  I provided 32 minutes of non-face-to-face time during this encounter.   BLauraine Rinne NP

## 2019-08-16 NOTE — Patient Instructions (Addendum)
You were seen today by Lauraine Rinne, NP  for:   1. Chronic cough  - fluconazole (DIFLUCAN) 200 MG tablet; Take 2 tablets (400 mg total) by mouth daily for 10 days.  Dispense: 20 tablet; Refill: 0 - promethazine-dextromethorphan (PROMETHAZINE-DM) 6.25-15 MG/5ML syrup; Take 5 mLs by mouth every 6 (six) hours as needed for cough.  Dispense: 240 mL; Refill: 0  2. Allergic rhinitis, unspecified seasonality, unspecified trigger  Continue daily Claritin Continue nasal saline rinses  3. Bronchiectasis without complication (HCC)  Continue flutter valve  4. Gastroesophageal reflux disease, unspecified whether esophagitis present  Continue medications as managed by gastroenterology  GERD management: >>>Avoid laying flat until 2 hours after meals >>>Elevate head of the bed including entire chest >>>Reduce size of meals and amount of fat, acid, spices, caffeine and sweets >>>If you are smoking, Please stop! >>>Decrease alcohol consumption >>>Work on maintaining a healthy weight with normal BMI    5. Chronic pain syndrome  Continue to follow-up with chronic pain   We recommend today:  Meds ordered this encounter  Medications  . fluconazole (DIFLUCAN) 200 MG tablet    Sig: Take 2 tablets (400 mg total) by mouth daily for 10 days.    Dispense:  20 tablet    Refill:  0  . promethazine-dextromethorphan (PROMETHAZINE-DM) 6.25-15 MG/5ML syrup    Sig: Take 5 mLs by mouth every 6 (six) hours as needed for cough.    Dispense:  240 mL    Refill:  0    Follow Up:    Return in about 4 weeks (around 09/13/2019), or if symptoms worsen or fail to improve, for Follow up with Dr. Melvyn Novas - 65min ov slot .   Please do your part to reduce the spread of COVID-19:      Reduce your risk of any infection  and COVID19 by using the similar precautions used for avoiding the common cold or flu:  Marland Kitchen Wash your hands often with soap and warm water for at least 20 seconds.  If soap and water are not readily  available, use an alcohol-based hand sanitizer with at least 60% alcohol.  . If coughing or sneezing, cover your mouth and nose by coughing or sneezing into the elbow areas of your shirt or coat, into a tissue or into your sleeve (not your hands). Langley Gauss A MASK when in public  . Avoid shaking hands with others and consider head nods or verbal greetings only. . Avoid touching your eyes, nose, or mouth with unwashed hands.  . Avoid close contact with people who are sick. . Avoid places or events with large numbers of people in one location, like concerts or sporting events. . If you have some symptoms but not all symptoms, continue to monitor at home and seek medical attention if your symptoms worsen. . If you are having a medical emergency, call 911.   [place] / e-Visit: [email or web address]         [facility]: [facility]: [id number]                   [facility]: [id number]     It is flu season:   >>> Best ways to protect herself from the flu: Receive the yearly flu vaccine, practice good hand hygiene washing with soap and also using hand sanitizer when available, eat a nutritious meals, get adequate rest, hydrate appropriately   Please  contact the office if your symptoms worsen or you have concerns that you are not improving.   Thank you for choosing [facility] for your healthcare, and for allowing [place] to partner with you on your healthcare journey. I am thankful to be able to provide care to you today.   Wyn Quaker FNP-C

## 2019-08-16 NOTE — Assessment & Plan Note (Signed)
Plan: Continue to follow-up with gastroenterology Continue Protonix 40 mg twice daily Continue to follow GERD diet

## 2019-08-16 NOTE — Assessment & Plan Note (Signed)
Plan: Continue flutter valve use 

## 2019-08-17 ENCOUNTER — Telehealth: Payer: Self-pay | Admitting: Adult Health

## 2019-08-17 NOTE — Telephone Encounter (Signed)
Patient seen in by [facility] NP 08/16/2019 rx Diflucan 400mg  daily. After taking Diflucan today she developed severe jitteriness and shakiness sensation.  She denies any chest pain, lip swelling, rash, difficulty swallowing, shortness of breath.  Says that her heart rate is 74 bpm.  She denies any syncope.  No arm weakness.  No palpitations. She has no known heart issues. No nausea vomiting or diarrhea.  Recommended patient get cool cloth.  Drink cold water if symptoms do not improve in the next 30 minutes or worsen she is to seek urgent care or emergency room attention.  Patient is advised if symptoms are not improving she will need further evaluation at local urgent care. We will send message to [facility] NP to address change of Diflucan tomorrow.  Advised patient to not take any further Diflucan at this time.   Please contact office for sooner follow up if symptoms do not improve or worsen or seek emergency care

## 2019-08-18 NOTE — Telephone Encounter (Signed)
Spoke with the pt and notified of recs per Aaron Edelman  She verbalized understanding  Appt with MW for tomorrow at 1:45 and she is aware to bring all meds

## 2019-08-18 NOTE — Telephone Encounter (Signed)
Patient can stop Diflucan.  Patient was treated empirically for this course.  She clearly cannot tolerate this medication.  Okay to stop.  Okay to move up cough consult appointment with Dr. Melvyn Novas for further evaluation.  This needs to be in a 30-minute time slot with all medications and hand.  Will route to Dr. Lamonte Sakai as Juluis Rainier. Wyn Quaker, FNP

## 2019-08-19 ENCOUNTER — Ambulatory Visit (INDEPENDENT_AMBULATORY_CARE_PROVIDER_SITE_OTHER): Payer: Medicare Other

## 2019-08-19 ENCOUNTER — Other Ambulatory Visit: Payer: Self-pay

## 2019-08-19 ENCOUNTER — Encounter: Payer: Self-pay | Admitting: Internal Medicine

## 2019-08-19 ENCOUNTER — Ambulatory Visit: Payer: Medicare Other | Admitting: Internal Medicine

## 2019-08-19 DIAGNOSIS — R053 Chronic cough: Secondary | ICD-10-CM

## 2019-08-19 DIAGNOSIS — R05 Cough: Secondary | ICD-10-CM | POA: Diagnosis not present
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
## 2019-08-19 NOTE — Patient Instructions (Addendum)
Espeprazole should be 20 mg x 2   30-60  min before first and last meal  For cough >  Tessalon 200 mg every 6 hours as needed with goal to completely eliminate the cough and the the urge to clear the throat.   GERD (REFLUX)  is an extremely common cause of respiratory symptoms just like yours , many times with no obvious heartburn at all.    It can be treated with medication, but also with lifestyle changes including elevation of the head of your bed (ideally with 6 -8inch blocks under the headboard of your bed),  Smoking cessation, avoidance of late meals, excessive alcohol, and avoid fatty foods, chocolate, peppermint, colas, red wine, and acidic juices such as orange juice.  NO MINT OR MENTHOL PRODUCTS SO NO COUGH DROPS  USE SUGARLESS CANDY INSTEAD (Jolley ranchers or Stover's or Life Savers) or even ice chips will also do - the key is to swallow to prevent all throat clearing. NO OIL BASED VITAMINS - use powdered substitutes.  Avoid fish oil when coughing.  Please remember to go to the  x-ray department  for your tests - we will call you with the results when they are available    Please schedule a follow up office visit in 4 weeks, sooner if needed  with all medications /inhalers/ solutions in hand so we can verify exactly what you are taking. This includes all medications from all doctors and over the counters - be sure pt also brings flutter with her to train how to use effectively

## 2019-08-20 ENCOUNTER — Encounter: Payer: Self-pay | Admitting: Internal Medicine

## 2019-08-20 NOTE — Assessment & Plan Note (Signed)
Onset ? Around 2009 prev eval by Dr Joya Gaskins better off ACEi  - Allergy eval done early [place] for rhinitis and recurrent sinusitis pos only dust > avoidance. -Spirometry 06/03/18 nl  - HRCT chest 06/22/18 1. Negative for interstitial lung disease. 2. Minimal air trapping, indicative of small airways disease - Dg UGI 04/26/19 1. Esophageal dysmotility. 2. 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 Fob 07/14/19 Laryngeal edema.  - Hyperplastic changes were seen in the larynx.   -  tracheomalacia/ RML  BAL no eos/ neg cty/ few candida   In absence of demonstrable airflow obstruction,  I doubt the tracheomalacia is causing the cough or sob but should be treated with flutter valve and aggressive control of all aspects of GERD in this pt with features predominantly of UACS = Upper airway cough syndrome (previously labeled PNDS),  is so named because it's frequently impossible to sort out how much is  CR/sinusitis with freq throat clearing (which can be related to primary GERD)   vs  causing  secondary (" extra esophageal")  GERD from wide swings in gastric pressure that occur with throat clearing, often  promoting self use of mint and menthol lozenges that reduce the lower esophageal sphincter tone and exacerbate the problem further in a cyclical fashion.   These are the same pts (now being labeled as having "irritable larynx syndrome" by some cough centers) who not infrequently have a history of having failed to tolerate ace inhibitors(as is the case here)   dry powder inhalers or biphosphonates or report having atypical/extraesophageal reflux symptoms that don't respond to standard doses of PPI  and are easily confused as having aecopd or asthma flares by even experienced allergists/ pulmonologists (myself included).   Of the three most common causes of  Sub-acute / recurrent or chronic cough, only one (GERD)  can actually contribute to/ trigger  the other two (asthma and post nasal drip  syndrome)  and perpetuate the cylce of cough.  While not intuitively obvious, many patients with chronic low grade reflux do not cough until there is a primary insult that disturbs the protective epithelial barrier and exposes sensitive nerve endings.   This is typically viral but can due to PNDS and  either may apply here.     >>> The point is that once this occurs, it is difficult to eliminate the cycle  using anything but a maximally effective acid suppression regimen at least in the short run, accompanied by an appropriate diet to address non acid GERD and control / eliminate the cough itself  With gabapentin and tessalon   Advised:  The standardized cough guidelines published in Chest by Lissa Morales in 2006 are still the best available and consist of a multiple step process (up to 12!) , not a single office visit,  and are intended  to address this problem logically,  with an alogrithm dependent on response to empiric treatment at  each progressive step  to determine a specific diagnosis with  minimal addtional testing needed. Therefore if adherence is an issue or can't be accurately verified,  it's very unlikely the standard evaluation and treatment will be successful here.    Furthermore, response to therapy (other than acute cough suppression, which should only be used short term with avoidance of narcotic containing cough syrups if possible), can be a gradual process for which the patient is not likely to  perceive immediate benefit.  Unlike going to an eye doctor where the best perscription is almost  always the first one and is immediately effective, this is almost never the case in the management of chronic cough syndromes. Therefore the patient needs to commit up front to consistently adhere to recommendations  for up to 6 weeks of therapy directed at the likely underlying problem(s) before the response can be reasonably evaluated.  >>> f/u in 4 weeks with all meds in hand using a trust  but verify approach to confirm accurate Medication  Reconciliation The principal here is that until we are certain that the  patients are doing what we've asked, it makes no sense to ask them to do more.           Each maintenance medication was reviewed in detail including emphasizing most importantly the difference between maintenance and prns and under what circumstances the prns are to be triggered using an action plan format that is not reflected in the computer generated alphabetically organized AVS which I have not found useful in most complex patients, especially with respiratory illnesses  Total time for H and P, chart review, counseling, teaching device and generating AVS / charting = 60 min new pt eval

## 2019-08-23 ENCOUNTER — Telehealth: Payer: Self-pay | Admitting: *Deleted

## 2019-08-23 NOTE — Progress Notes (Signed)
Spoke with pt and notified of results per Dr. Wert. Pt verbalized understanding and denied any questions. 

## 2019-08-23 NOTE — Telephone Encounter (Signed)
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  As she knows we tried treating it given her persistent symptoms and because it was the only positive finding.  My understanding is that we had to stop the treatment because it was hard to tolerate.  I think that this is okay because I doubt that the Candida is responsible for her symptoms.  All of her other cultures are negative and reassuring.

## 2019-08-23 NOTE — Telephone Encounter (Signed)
Called and spoke with pt letting her know the info stated by RB and pt verbalized understanding. Nothing further needed.

## 2019-08-23 NOTE — Telephone Encounter (Signed)
Dr Lamonte Sakai,  Pt is asking about results from culture from her bronch  Please advise thanks

## 2019-08-27 LAB — ACID FAST CULTURE WITH REFLEXED SENSITIVITIES (MYCOBACTERIA): Acid Fast Culture: NEGATIVE

## 2019-09-14 ENCOUNTER — Encounter: Payer: Self-pay | Admitting: Pain Medicine

## 2019-09-14 NOTE — Progress Notes (Signed)
Patient: Kari Gibbs  Service Category: E/M  Provider: Gaspar Cola, MD  DOB: 1951/03/28  DOS: 09/15/2019  Location: Office  MRN: [id number]  Setting: Ambulatory outpatient  Referring Provider: Cari Caraway, MD  Type: Established Patient  Specialty: Interventional Pain Management  PCP: Kari Caraway, MD  Location: Remote location  Delivery: TeleHealth     Virtual Encounter - Pain Management PROVIDER NOTE: Information contained herein reflects review and annotations entered in association with encounter. Interpretation of such information and data should be left to medically-trained personnel. Information provided to patient can be located elsewhere in the medical record under "Patient Instructions". Document created using STT-dictation technology, any transcriptional errors that may result from process are unintentional.    Contact & Pharmacy Preferred: [phone] Home: [phone] (home) Mobile: [phone] (mobile) E-mail: [email or web address]  [id number] - [place], [address] [address] [place] [address] Phone: [phone] Fax: [phone]  [place] [place] [address] Phone: [phone] Fax: [phone]   Pre-screening  Kari Gibbs offered "in-person" vs "virtual" encounter. She indicated preferring virtual for this encounter.   Reason COVID-19*  Social distancing based on CDC and AMA recommendations.   I contacted Kari Gibbs on 09/15/2019 via telephone.      I clearly identified myself as Gaspar Cola, MD. I verified that I was speaking with the correct person using two identifiers (Name: Kari Gibbs, and date of birth: [birth year]).  Consent I sought verbal advanced consent from Kari Gibbs for virtual visit interactions. I informed Kari Gibbs of possible security and privacy  concerns, risks, and limitations associated with providing "not-in-person" medical evaluation and management services. I also informed Kari Gibbs of the availability of "in-person" appointments. Finally, I informed her that there would be a charge for the virtual visit and that she could be  personally, fully or partially, financially responsible for it. Kari Gibbs expressed understanding and agreed to proceed.   Historic Elements   Kari Gibbs is a 69 y.o. year old, female patient evaluated today after her last encounter by our practice on 05/20/2019. 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 Kari Gibbs has a current medication list which includes the following prescription(s): azelastine, benzonatate, buspirone, vitamin d3, diclofenac sodium, esomeprazole, gabapentin, irbesartan, vitron-c, loratadine, magnesium, montelukast, [START ON 09/17/2019] morphine, [START ON 10/17/2019] morphine, [START ON 11/16/2019] morphine, naloxone, nifedipine, prenatal mv & min w/fa-dha,  ra probiotic gummies, flutter, venlafaxine xr, and vitamin b-12. She  reports that she has never smoked. She has never used smokeless tobacco. She reports previous alcohol use. She reports that she does not use drugs. Kari Gibbs is allergic to iohexol; ace inhibitors; losartan potassium; and neosporin [neomycin-bacitracin zn-polymyx].  HPI  Today, she is being contacted for medication management. The patient indicates doing well with the current medication regimen. No adverse reactions or side effects reported to the medications.   Pharmacotherapy Assessment  Analgesic: MS Contin 30 mg, 1 tab PO q 12 hrs (60 mg/day of morphine) MME/day:60 mg/day.   Monitoring: Pharmacotherapy: No side-effects or adverse reactions reported. [place] PMP: PDMP reviewed during this encounter.       Compliance: No problems identified. Effectiveness: Clinically acceptable. Plan: Refer to "POC".  UDS:  Summary  Date Value Ref Range Status  09/07/2018 FINAL  Final    Comment:    ==================================================================== TOXASSURE SELECT 13 (MW) ==================================================================== Test                             Result       Flag       Units Drug Present and Declared for Prescription Verification   Morphine                       >7519        EXPECTED   ng/mg creat   Normorphine                    1265         EXPECTED   ng/mg creat    Potential sources of large amounts of morphine in the absence of    codeine include administration of morphine or use of heroin.    Normorphine is an expected metabolite of morphine.   Hydromorphone                  347          EXPECTED   ng/mg creat    Hydromorphone may be present as a metabolite of morphine;    concentrations of hydromorphone rarely exceed 5% of the morphine    concentration when this is the source of hydromorphone. ==================================================================== Test                       Result    Flag   Units      Ref Range   Creatinine              133              mg/dL      >=20 ==================================================================== Declared Medications:  The flagging and interpretation on this report are based on the  following declared medications.  Unexpected results may arise from  inaccuracies in the declared medications.  **Note: The testing scope of this panel includes these medications:  Morphine (MS Contin)  **Note: The testing scope of this panel does not include following  reported medications:  Azelastine (Astelin)  Benzonatate (Tessalon)  Buspirone (BuSpar)  Cyanocobalamin  Dextromethorphan (Prometh with Dextromethorphan)  Estrogen (Premarin)  Gabapentin  Irbesartan (Avapro)  Magnesium  Montelukast (Singulair)  Multivitamin (Prenatal Vitamin)  Naloxone (Narcan)  Pantoprazole (Protonix)  Promethazine (Prometh with Dextromethorphan)  Supplement  Supplement (Probiotic)  Topical Diclofenac  Vitamin D3 ==================================================================== For clinical consultation, please call [phone]. ====================================================================    Laboratory Chemistry Profile (12 mo)  Renal: No results found for requested labs within last 8760 hours.  Lab Results  Component Value Date   GFRAA >60 01/04/2016   GFRNONAA >60 01/04/2016   Hepatic: No results found for requested labs within last 8760 hours. Lab Results  Component  Value Date   AST 33 01/04/2016   ALT 26 01/04/2016   Other: No results found for requested labs within last 8760 hours.  Note: Above Lab results reviewed.  Imaging  DG Chest 2 View CLINICAL DATA:  Chronic cough  EXAM: CHEST - 2 VIEW  COMPARISON:  06/03/2018, CT chest 06/22/2018  FINDINGS: The heart size and mediastinal contours are within normal limits. Stable scarring at the bases. No acute airspace disease.  Aortic atherosclerosis. No pneumothorax.  IMPRESSION: No active cardiopulmonary disease.Stable scarring at the bases.  Electronically Signed   By: Donavan Foil M.D.   On: 08/19/2019 19:33   Assessment  The primary encounter diagnosis was Chronic pain syndrome. Diagnoses of Chronic flank pain (Primary Area of Pain) (Left) and History of rib fracture (4th and 8th rib) (Left) were also pertinent to this visit.  Plan of Care  Problem-specific:  No problem-specific Assessment & Plan notes found for this encounter.  I have discontinued [employer] PRENATAL VIT-FE FUM-FA-OMEGA PO and promethazine-dextromethorphan. I am also having her start on morphine and morphine. Additionally, I am having her maintain her Magnesium, busPIRone, vitamin B-12, Vitamin D3, naloxone, irbesartan, Flutter, venlafaxine XR, montelukast, loratadine, benzonatate, NIFEdipine, Vitron-C, diclofenac sodium, gabapentin, azelastine, esomeprazole, RA Probiotic Gummies, Prenatal MV & Min w/FA-DHA (PRENATAL ADULT GUMMY/DHA/FA PO), and morphine.  Pharmacotherapy (Medications Ordered): Meds ordered this encounter  Medications  . morphine (MS CONTIN) 30 MG 12 hr tablet    Sig: Take 1 tablet (30 mg total) by mouth every 12 (twelve) hours. Must last 30 days    Dispense:  60 tablet    Refill:  0    Chronic Pain: STOP Act (Not applicable) Fill 1 day early if closed on refill date. Do not fill until: 09/17/2019. To last until: 10/17/2019. Avoid benzodiazepines within 8 hours of opioids  . morphine (MS CONTIN) 30 MG 12 hr tablet    Sig: Take 1 tablet (30 mg total) by mouth every 12 (twelve) hours. Must last 30 days    Dispense:  60 tablet    Refill:  0    Chronic Pain: STOP Act (Not applicable) Fill 1 day early if closed on refill date. Do not fill until: 10/17/2019. To last until: 11/16/2019. Avoid benzodiazepines within 8 hours of opioids  . morphine (MS CONTIN) 30 MG 12 hr tablet    Sig: Take 1 tablet (30 mg total) by mouth every  12 (twelve) hours. Must last 30 days    Dispense:  60 tablet    Refill:  0    Chronic Pain: STOP Act (Not applicable) Fill 1 day early if closed on refill date. Do not fill until: 11/16/2019. To last until: 12/16/2019. Avoid benzodiazepines within 8 hours of opioids   Orders:  No orders of the defined types were placed in this encounter.  Follow-up plan:   Return in about 13 weeks (around 12/15/2019) for (VV), (MM).      Interventional management options:  Considering:   Diagnosticleft costochondral joint injection  Possible left intercostal nerve RFA  Therapeutic Left greater occipital nerve RFA Diagnostic left C2 + TON nerve block Possible left C2 + TON RFA    Palliative PRN treatment(s):   Palliative left intercostal nerve blocks (T5 to T8) #2  Palliative left GONB block #3  Palliative bilateral sacroiliac joint block #2      Recent Visits No visits were found meeting these conditions.  Showing recent visits within past 90 days and meeting all other requirements   Today's  Visits Date Type Provider Dept  09/15/19 Telemedicine [name], MD [facility]  Showing today's visits and meeting all other requirements   Future Appointments No visits were found meeting these conditions.  Showing future appointments within next 90 days and meeting all other requirements   I discussed the assessment and treatment plan with the patient. The patient was provided an opportunity to ask questions and all were answered. The patient agreed with the plan and demonstrated an understanding of the instructions.  Patient advised to call back or seek an in-person evaluation if the symptoms or condition worsens.  Duration of encounter: 12 minutes.  Note by: Gaspar Cola, MD Date: 09/15/2019; Time: 8:48 AM

## 2019-09-15 ENCOUNTER — Other Ambulatory Visit: Payer: Self-pay

## 2019-09-15 ENCOUNTER — Ambulatory Visit: Payer: Medicare Other | Attending: Pain Medicine | Admitting: Pain Medicine

## 2019-09-15 DIAGNOSIS — G8929 Other chronic pain: Secondary | ICD-10-CM

## 2019-09-15 DIAGNOSIS — G894 Chronic pain syndrome: Secondary | ICD-10-CM

## 2019-09-15 DIAGNOSIS — Z8781 Personal history of (healed) traumatic fracture: Secondary | ICD-10-CM

## 2019-09-15 DIAGNOSIS — R109 Unspecified abdominal pain: Secondary | ICD-10-CM | POA: Diagnosis not present

## 2019-09-15 MED ORDER — MORPHINE SULFATE ER 30 MG PO TBCR: 30.0000 mg | EXTENDED_RELEASE_TABLET | Freq: Two times a day (BID) | ORAL | 0 refills | Status: DC

## 2019-09-16 ENCOUNTER — Other Ambulatory Visit: Payer: Self-pay

## 2019-09-16 ENCOUNTER — Ambulatory Visit: Payer: Medicare Other | Admitting: Internal Medicine

## 2019-09-16 ENCOUNTER — Encounter: Payer: Self-pay | Admitting: Internal Medicine

## 2019-09-16 DIAGNOSIS — R05 Cough: Secondary | ICD-10-CM

## 2019-09-16 DIAGNOSIS — R053 Chronic cough: Secondary | ICD-10-CM

## 2019-09-16 MED ORDER — PROMETHAZINE-DM 6.25-15 MG/5ML PO SYRP: 5.0000 mL | ORAL_SOLUTION | Freq: Four times a day (QID) | ORAL | 0 refills | Status: DC | PRN

## 2019-09-16 MED ORDER — BENZONATATE 200 MG PO CAPS: 200.0000 mg | ORAL_CAPSULE | Freq: Three times a day (TID) | ORAL | 3 refills | Status: DC | PRN
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
## 2019-09-16 NOTE — Patient Instructions (Signed)
For cough use tessalon 200 mg up to three times daily   For breakthru use promethazine DM 1 tsp every 6 hours if needed    Please schedule a follow up visit in 3 months but call sooner if needed  with all medications /inhalers/ solutions in hand so we can verify exactly what you are taking. This includes all medications from all doctors and over the counters and flutter valve

## 2019-09-17 ENCOUNTER — Encounter: Payer: Self-pay | Admitting: Internal Medicine

## 2019-09-17 NOTE — Assessment & Plan Note (Signed)
Onset ? Around 2009 prev eval by Dr Joya Gaskins better off ACEi  - Allergy eval done early [place] for rhinitis and recurrent sinusitis pos only dust > avoidance. -Spirometry 06/03/18 nl  - HRCT chest 06/22/18 1. Negative for interstitial lung disease. 2. Minimal air trapping, indicative of small airways disease - Dg UGI 04/26/19 1. Esophageal dysmotility. 2. 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 Fob 07/14/19 Laryngeal edema.  - Hyperplastic changes were seen in the larynx.   -  tracheomalacia/ RML  BAL no eos/ neg cty/ few candida   Improved though still grinding throat on exam has found best rx is tessalon 200 supplemented with promethazine DM and no reason she can't continue this but emphasized again she can use hard rock candies and swallow to clear throat rather than grinding the airway and creating cyclical cough pattern           Each maintenance medication was reviewed in detail including emphasizing most importantly the difference between maintenance and prns and under what circumstances the prns are to be triggered using an action plan format where appropriate.  Total time for H and P, chart review, counseling,  and generating customized AVS unique to this office visit / charting = 20 min

## 2019-10-05 ENCOUNTER — Other Ambulatory Visit: Payer: Medicare Other

## 2019-10-06 ENCOUNTER — Ambulatory Visit
Admission: RE | Admit: 2019-10-06 | Discharge: 2019-10-06 | Disposition: A | Discharge status: Home / Self Care | Payer: Medicare Other | Source: Ambulatory Visit | Attending: Family Medicine | Admitting: Family Medicine

## 2019-10-06 ENCOUNTER — Other Ambulatory Visit: Payer: Self-pay

## 2019-10-06 DIAGNOSIS — M810 Age-related osteoporosis without current pathological fracture: Secondary | ICD-10-CM

## 2019-10-13 ENCOUNTER — Telehealth: Payer: Self-pay | Admitting: Internal Medicine

## 2019-10-13 DIAGNOSIS — R05 Cough: Secondary | ICD-10-CM

## 2019-10-13 DIAGNOSIS — R053 Chronic cough: Secondary | ICD-10-CM

## 2019-10-13 MED ORDER — PROMETHAZINE-DM 6.25-15 MG/5ML PO SYRP: 5.0000 mL | ORAL_SOLUTION | Freq: Four times a day (QID) | ORAL | 0 refills | Status: DC | PRN

## 2019-10-13 NOTE — Telephone Encounter (Signed)
Spoke with the pt  She is asking for a refill on her promethazine/dm syrup  Last prescribed 09/16/19 #118 ml  She is c/o increased, non prod cough x 4 days  She already has refills on the tessalon  Please advise, thanks

## 2019-10-13 NOTE — Telephone Encounter (Signed)
Called the patient back and advised the prescription for cough medicine will be sent to the pharmacy. However, she needs to keep the appointment already scheduled for 12/14/19. However, if she runs out of the cough syrup before that date, she needs to call the office to have the appointment moved up so she can be seen before any refills can be issued.  Patient voiced understanding. Prescription sent to pharmacy. Nothing further needed at this time.

## 2019-10-13 NOTE — Telephone Encounter (Signed)
Ok to refill promethazine dm x 240 cc but needs ov before this expires with all meds in hand to regroup

## 2019-11-23 ENCOUNTER — Telehealth: Payer: Self-pay | Admitting: Internal Medicine

## 2019-11-23 DIAGNOSIS — R053 Chronic cough: Secondary | ICD-10-CM

## 2019-11-23 DIAGNOSIS — R05 Cough: Secondary | ICD-10-CM

## 2019-11-23 NOTE — Telephone Encounter (Signed)
Called and spoke with pt. Pt is requesting a refill of the promethazine cough syrup as well as the benzonatate 200mg . Dr. Melvyn Novas, please advise if you are okay refilling meds.

## 2019-11-24 ENCOUNTER — Telehealth: Payer: Self-pay | Admitting: Internal Medicine

## 2019-11-24 MED ORDER — BENZONATATE 200 MG PO CAPS: 200.0000 mg | ORAL_CAPSULE | Freq: Three times a day (TID) | ORAL | 3 refills | Status: DC | PRN

## 2019-11-24 NOTE — Telephone Encounter (Signed)
Please refer to encounter from 4/13.

## 2019-11-24 NOTE — Telephone Encounter (Signed)
Called and spoke with pt letting her know that MW said we could refill meds for her. Stated to her that if not doing well that we might need to move appt sooner and she stated she was about to go out of town but would call when returned back to possibly move up appt. Pt said to go ahead and refill benzonatate so that has been sent to pharmacy for pt. Nothing further needed.

## 2019-11-24 NOTE — Telephone Encounter (Signed)
Ok to refill both x one more pending f/u - if not doing well and needs to move up f/u that's fine too

## 2019-11-24 NOTE — Telephone Encounter (Signed)
Called and spoke with pt who stated she was able to get rx from [facility] off [address]. Nothing further needed.

## 2019-11-24 NOTE — Telephone Encounter (Signed)
ATC patient unable to reach LM to call back office (x1)  

## 2019-12-07 NOTE — Progress Notes (Signed)
Patient: Kari Gibbs  Service Category: E/M  Provider: Gaspar Cola, MD  DOB: 29-Jul-1951  DOS: 12/08/2019  Location: Office  MRN: [id number]  Setting: Ambulatory outpatient  Referring Provider: Cari Caraway, MD  Type: Established Patient  Specialty: Interventional Pain Management  PCP: Cari Caraway, MD  Location: Remote location  Delivery: TeleHealth     Virtual Encounter - Pain Management PROVIDER NOTE: Information contained herein reflects review and annotations entered in association with encounter. Interpretation of such information and data should be left to medically-trained personnel. Information provided to patient can be located elsewhere in the medical record under "Patient Instructions". Document created using STT-dictation technology, any transcriptional errors that may result from process are unintentional.    Contact & Pharmacy Preferred: [phone] Home: [phone] (home) Mobile: [phone] (mobile) E-mail: [email or web address]  [facility] [place], [place] [address]: [phone]: [phone] [address] [place]Phone: [phone]: [phone]  Pre-screening  [name] "in-person" vs "virtual" encounter. She indicated preferring virtual for this encounter.   Reason COVID-19*  Social distancing based on CDC and AMA recommendations.   I contacted SJamelia Varanoon 12/08/2019 via telephone.      I clearly identified myself as FGaspar Cola MD. I verified that I was speaking with the correct person using two identifiers (Name: STamirah Gibbs and date of birth: [birth year].  Consent I sought verbal advanced consent from SLestine Boxfor virtual visit interactions. I informed [name] possible security and privacy  concerns, risks, and limitations associated with providing "not-in-person" medical evaluation and management services. I also informed [name] the availability of "in-person" appointments. Finally, I informed her that there would be a charge for the virtual visit and that she could be  personally, fully or partially, financially responsible for it. [name] understanding and agreed to proceed.   Historic Elements   [name] a 69y.o. year old, female patient evaluated today after her last contact with our practice on Visit date not found. 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 Ms. YSouthgatehas a current medication list which includes the following prescription(Kari): azelastine, benzonatate, buspirone, vitamin d3, cranberry, esomeprazole, gabapentin, irbesartan, vitron-c, loratadine, magnesium, [START ON 12/16/2019] morphine, [START ON 01/15/2020] morphine, [START ON 02/14/2020] morphine, nifedipine, prenatal mv & min w/fa-dha, ra probiotic  gummies, promethazine-dextromethorphan, flutter, venlafaxine xr, vitamin b-12, [START ON 12/16/2019] diclofenac sodium, and naloxone. She  reports that she has never smoked. She has never used smokeless tobacco. She reports previous alcohol use. She reports that she does not use drugs. [name] allergic to iohexol; ace inhibitors; losartan  potassium; and neosporin [neomycin-bacitracin zn-polymyx].   HPI  Today, she is being contacted for medication management. The patient indicates doing well with the current medication regimen. No adverse reactions or side effects reported to the medications.  The patient indicates that lately she has been experiencing more pain in the area of the left chest/ribs/flank.  She also indicates that she feels like the pain medication is not working as well as it used to.  This is likely due to the development of tolerance to the opioid analgesic.  Today I have talked to her about using "Drug Holidays" in order to eliminate the issue of tolerance.  Eliminate the issue of tolerance.  Currently she is taking the MS Contin 30 mg tablets, but we could plan on using MSIR 15 mg tablets for the downward taper of the opioids.  To start with, today I will provide the patient with some written information regarding "Drug Holidays".  Once she is more familiar with the concept, then we will switch her to the lower milligram morphine in order to smooth out this downward taper.  We will probably do this on the patient'Kari next encounter.  Pharmacotherapy Assessment  Analgesic: MS Contin 30 mg, 1 tab PO q 12 hrs (60 mg/day of morphine) MME/day:60 mg/day.   Monitoring: [place] PMP: PDMP reviewed during this encounter.       Pharmacotherapy: No side-effects or adverse reactions reported. Compliance: No problems identified. Effectiveness: Clinically acceptable. Plan: Refer to "POC".  UDS:  Summary  Date Value Ref Range Status  09/07/2018 FINAL  Final    Comment:     ==================================================================== TOXASSURE SELECT 13 (MW) ==================================================================== Test                             Result       Flag       Units Drug Present and Declared for Prescription Verification   Morphine                       >7519        EXPECTED   ng/mg creat   Normorphine                    1265         EXPECTED   ng/mg creat    Potential sources of large amounts of morphine in the absence of    codeine include administration of morphine or use of heroin.    Normorphine is an expected metabolite of morphine.   Hydromorphone                  347          EXPECTED   ng/mg creat    Hydromorphone may be present as a metabolite of morphine;    concentrations of hydromorphone rarely exceed 5% of the morphine    concentration when this is the source of hydromorphone. ==================================================================== Test                      Result    Flag   Units      Ref Range   Creatinine              133              mg/dL      >=20 ==================================================================== Declared Medications:  The flagging and interpretation on this report are  based on the  following declared medications.  Unexpected results may arise from  inaccuracies in the declared medications.  **Note: The testing scope of this panel includes these medications:  Morphine (MS Contin)  **Note: The testing scope of this panel does not include following  reported medications:  Azelastine (Astelin)  Benzonatate (Tessalon)  Buspirone (BuSpar)  Cyanocobalamin  Dextromethorphan (Prometh with Dextromethorphan)  Estrogen (Premarin)  Gabapentin  Irbesartan (Avapro)  Magnesium  Montelukast (Singulair)  Multivitamin (Prenatal Vitamin)  Naloxone (Narcan)  Pantoprazole (Protonix)  Promethazine (Prometh with Dextromethorphan)  Supplement  Supplement (Probiotic)  Topical Diclofenac   Vitamin D3 ==================================================================== For clinical consultation, please call [phone]. ====================================================================    Laboratory Chemistry Profile   Renal Lab Results  Component Value Date   BUN 20 01/04/2016   CREATININE 0.76 01/04/2016   GFRAA >60 01/04/2016   GFRNONAA >60 01/04/2016     Hepatic Lab Results  Component Value Date   AST 33 01/04/2016   ALT 26 01/04/2016   ALBUMIN 4.2 01/04/2016   ALKPHOS 97 01/04/2016   AMYLASE 30 02/27/2010   LIPASE 22 10/12/2012     Electrolytes Lab Results  Component Value Date   NA 139 01/04/2016   K 4.6 01/04/2016   CL 99 (L) 01/04/2016   CALCIUM 9.7 01/04/2016   MG 2.1 01/04/2016     Bone Lab Results  Component Value Date   25OHVITD1 49 01/04/2016   25OHVITD2 25 01/04/2016   25OHVITD3 24 01/04/2016     Inflammation (CRP: Acute Phase) (ESR: Chronic Phase) Lab Results  Component Value Date   CRP <0.5 01/04/2016   ESRSEDRATE 17 01/04/2016       Note: Above Lab results reviewed.  Imaging  DG BONE DENSITY (DXA) EXAM: DUAL X-RAY ABSORPTIOMETRY (DXA) FOR BONE MINERAL DENSITY  IMPRESSION: Referring Physician:  [facility] MCNEILL Your patient completed a BMD test using Lunar IDXA DXA system ( analysis version: 16 ) manufactured by [employer]. Technologist: KT PATIENT: Name: Kari Gibbs, Kari Gibbs Patient ID: [id number] Birth Date: October 23, 1950 Height: 64.7 in. Sex: Female Measured: 10/06/2019 Weight: 215.6 lbs. Indications: Bariatric surgery, Caucasian, Depression, Effexor, Estrogen Deficient, Family History of Osteoporosis, Gabapentin, Height Loss (781.91), History of Fracture (Adult) (V15.51), Hysterectomy, Nexium, Postmenopausal Fractures: Ankle Treatments: Prolia, Vitamin D (E933.5)  ASSESSMENT: The BMD measured at Femur Neck Right is 0.779 g/cm2 with a T-score of -1.9. This patient is considered osteopenic according to  [facility]) criteria.  The scan quality is good. Lumbar spine was not utilized due to advanced degenerative changes. Patient does not meet criteria for FRAX due to Prolia.  Site Region Measured Date Measured Age YA BMD Significant CHANGE T-score DualFemur Neck Right 10/06/2019 67.9 -1.9 0.779 g/cm2  DualFemur Total Mean 10/06/2019 67.9 -1.4 0.826 g/cm2  Left Forearm Radius 33% 10[age]06/2019 67.9 -0.9 0.808 g/cm2  World Health Organization [facility]) criteria for post-menopausal, Caucasian Women: Normal       T-score at or above -1 SD Osteopenia   T-score between -1 and -2.5 SD Osteoporosis T-score at or below -2.5 SD  RECOMMENDATION: 1. All patients should optimize calcium and vitamin D intake. 2. Consider FDA approved medical therapies in postmenopausal women and men aged 49 years and older, based on the following: a. A hip or vertebral (clinical or morphometric) fracture b. T- score < or = -2.5 at the femoral neck or spine after appropriate evaluation to exclude secondary causes c. Low bone mass (T-score between -1.0 and -2.5 at the femoral neck or spine) and a 10  year probability of a hip fracture > or = 3% or a 10 year probability of a major osteoporosis-related fracture > or = 20% based on the [place]-adapted WHO algorithm d. Clinician judgment and/or patient preferences may indicate treatment for people with 10-year fracture probabilities above or below these levels FOLLOW-UP: Patients with diagnosis of osteoporosis or at high risk for fracture should have regular bone mineral density tests. For patients eligible for Medicare, routine testing is allowed once every 2 years. The testing frequency can be increased to one year for patients who have rapidly progressing disease, those who are receiving or discontinuing medical therapy to restore bone mass, or have additional risk factors.  I have reviewed this report and agree with the above  findings.  [facility] Radiology  Electronically Signed   By: Lowella Grip III M.D.   On: 10/06/2019 11:02  Assessment  The primary encounter diagnosis was Chronic pain syndrome. Diagnoses of Chronic flank pain (Primary Area of Pain) (Left), Chronic thoracic radicular pain (Left-sided), Pharmacologic therapy, Neurogenic pain, and Opiate use (60 MME/day) were also pertinent to this visit.  Plan of Care  Problem-specific:  No problem-specific Assessment & Plan notes found for this encounter.  [name] has a current medication list which includes the following long-term medication(Kari): gabapentin, loratadine, [START ON 12/16/2019] morphine, [START ON 01/15/2020] morphine, [START ON 02/14/2020] morphine, nifedipine, and [START ON 12/16/2019] diclofenac sodium.  Pharmacotherapy (Medications Ordered): Meds ordered this encounter  Medications  . diclofenac Sodium (VOLTAREN) 1 % GEL    Sig: Apply 4 g topically 4 (four) times daily as needed.    Dispense:  350 g    Refill:  PRN    Fill one day early if pharmacy is closed on scheduled refill date. May substitute for generic, or similar, if available.  . morphine (MS CONTIN) 30 MG 12 hr tablet    Sig: Take 1 tablet (30 mg total) by mouth every 12 (twelve) hours. Must last 30 days    Dispense:  60 tablet    Refill:  0    Chronic Pain: STOP Act (Not applicable) Fill 1 day early if closed on refill date. Do not fill until: 12/16/2019. To last until: 01/15/2020. Avoid benzodiazepines within 8 hours of opioids  . morphine (MS CONTIN) 30 MG 12 hr tablet    Sig: Take 1 tablet (30 mg total) by mouth every 12 (twelve) hours. Must last 30 days    Dispense:  60 tablet    Refill:  0    Chronic Pain: STOP Act (Not applicable) Fill 1 day early if closed on refill date. Do not fill until: 01/15/2020. To last until: 02/14/2020. Avoid benzodiazepines within 8 hours of opioids  . morphine (MS CONTIN) 30 MG 12 hr tablet    Sig: Take 1 tablet (30 mg total) by  mouth every 12 (twelve) hours. Must last 30 days    Dispense:  60 tablet    Refill:  0    Chronic Pain: STOP Act (Not applicable) Fill 1 day early if closed on refill date. Do not fill until: 02/14/2020. To last until: 03/15/2020. Avoid benzodiazepines within 8 hours of opioids  . gabapentin (NEURONTIN) 400 MG capsule    Sig: Take 1-2 capsules (400-800 mg total) by mouth 2 (two) times daily.    Dispense:  360 capsule    Refill:  1    Fill one day early if pharmacy is closed on scheduled refill date. May substitute for generic if available.  Marland Kitchen  naloxone (NARCAN) 2 MG/2ML injection    Sig: Inject 1 mL (1 mg total) into the muscle as needed for up to 2 doses (for pain medicine overdose). Always have available. Inject in thigh muscle in the event of respiratory depression    Dispense:  2 mL    Refill:  1    Please instruct patient on emergency use of medication for opioid overdose.   Orders:  Orders Placed This Encounter  Procedures  . ToxASSURE Select 13 (MW), Urine    Volume: 30 ml(Kari). Minimum 3 ml of urine is needed. Document temperature of fresh sample. Indications: Long term (current) use of opiate analgesic (F35.456)    Order Specific Question:   Release to patient    Answer:   Immediate   Follow-up plan:   Return in about 14 weeks (around 03/15/2020) for (F2F), (MM).      Interventional management options:  Considering:   Diagnosticleft costochondral joint injection  Possible left intercostal nerve RFA  Therapeutic Left greater occipital nerve RFA Diagnostic left C2 + TON nerve block Possible left C2 + TON RFA    Palliative PRN treatment(Kari):   Palliative left intercostal nerve blocks (T5 to T8) #2  Palliative left GONB block #3  Palliative bilateral sacroiliac joint block #2       Recent Visits Date Type Provider Dept  09/15/19 Telemedicine [name], MD [facility]  Showing recent visits within past 90 days and meeting all other requirements   Today'Kari  Visits Date Type Provider Dept  12/08/19 Telemedicine [name], MD [facility]  Showing today'Kari visits and meeting all other requirements   Future Appointments No visits were found meeting these conditions.  Showing future appointments within next 90 days and meeting all other requirements   I discussed the assessment and treatment plan with the patient. The patient was provided an opportunity to ask questions and all were answered. The patient agreed with the plan and demonstrated an understanding of the instructions.  Patient advised to call back or seek an in-person evaluation if the symptoms or condition worsens.  Duration of encounter: 15 minutes.  Note by: Gaspar Cola, MD Date: 12/08/2019; Time: 12:52 PM

## 2019-12-08 ENCOUNTER — Other Ambulatory Visit: Payer: Self-pay

## 2019-12-08 ENCOUNTER — Ambulatory Visit: Payer: Medicare Other | Attending: Pain Medicine | Admitting: Pain Medicine

## 2019-12-08 DIAGNOSIS — M5414 Radiculopathy, thoracic region: Secondary | ICD-10-CM | POA: Diagnosis not present

## 2019-12-08 DIAGNOSIS — G894 Chronic pain syndrome: Secondary | ICD-10-CM | POA: Diagnosis not present

## 2019-12-08 DIAGNOSIS — Z79899 Other long term (current) drug therapy: Secondary | ICD-10-CM | POA: Diagnosis not present

## 2019-12-08 DIAGNOSIS — M792 Neuralgia and neuritis, unspecified: Secondary | ICD-10-CM

## 2019-12-08 DIAGNOSIS — R109 Unspecified abdominal pain: Secondary | ICD-10-CM

## 2019-12-08 DIAGNOSIS — F1190 Opioid use, unspecified, uncomplicated: Secondary | ICD-10-CM

## 2019-12-08 DIAGNOSIS — G8929 Other chronic pain: Secondary | ICD-10-CM

## 2019-12-08 MED ORDER — GABAPENTIN 400 MG PO CAPS: 400.0000 mg | ORAL_CAPSULE | Freq: Two times a day (BID) | ORAL | 1 refills | Status: DC

## 2019-12-08 MED ORDER — DICLOFENAC SODIUM 1 % EX GEL: 4.0000 g | Freq: Four times a day (QID) | CUTANEOUS | 99 refills | Status: DC | PRN

## 2019-12-08 MED ORDER — NALOXONE HCL 2 MG/2ML IJ SOSY: 1.0000 mg | PREFILLED_SYRINGE | INTRAMUSCULAR | 1 refills | Status: DC | PRN

## 2019-12-08 MED ORDER — MORPHINE SULFATE ER 30 MG PO TBCR: 30.0000 mg | EXTENDED_RELEASE_TABLET | Freq: Two times a day (BID) | ORAL | 0 refills | Status: DC

## 2019-12-08 NOTE — Patient Instructions (Signed)
____________________________________________________________________________________________  Drug Holidays (Slow)  What is a "Drug Holiday"? Drug Holiday: is the name given to the period of time during which a patient stops taking a medication(s) for the purpose of eliminating tolerance to the drug.  Benefits . Improved effectiveness of opioids. . Decreased opioid dose needed to achieve benefits. . Improved pain with lesser dose.  What is tolerance? Tolerance: is the progressive decreased in effectiveness of a drug due to its repetitive use. With repetitive use, the body gets use to the medication and as a consequence, it loses its effectiveness. This is a common problem seen with opioid pain medications. As a result, a larger dose of the drug is needed to achieve the same effect that used to be obtained with a smaller dose.  How long should a "Drug Holiday" last? You should stay off of the pain medicine for at least 14 consecutive days. (2 weeks)  Should I stop the medicine "cold turkey"? No. You should always coordinate with your Pain Specialist so that he/she can provide you with the correct medication dose to make the transition as smoothly as possible.  How do I stop the medicine? Slowly. You will be instructed to decrease the daily amount of pills that you take by one (1) pill every seven (7) days. This is called a "slow downward taper" of your dose. For example: if you normally take four (4) pills per day, you will be asked to drop this dose to three (3) pills per day for seven (7) days, then to two (2) pills per day for seven (7) days, then to one (1) per day for seven (7) days, and at the end of those last seven (7) days, this is when the "Drug Holiday" would start.   Will I have withdrawals? By doing a "slow downward taper" like this one, it is unlikely that you will experience any significant withdrawal symptoms. Typically, what triggers withdrawals is the sudden stop of a high  dose opioid therapy. Withdrawals can usually be avoided by slowly decreasing the dose over a prolonged period of time.  What are withdrawals? Withdrawals: refers to the wide range of symptoms that occur after stopping or dramatically reducing opiate drugs after heavy and prolonged use. Withdrawal symptoms do not occur to patients that use low dose opioids, or those who take the medication sporadically. Contrary to benzodiazepine (example: Valium, Xanax, etc.) or alcohol withdrawals ("Delirium Tremens"), opioid withdrawals are not lethal. Withdrawals are the physical manifestation of the body getting rid of the excess receptors.  Expected Symptoms Early symptoms of withdrawal may include: . Agitation . Anxiety . Muscle aches . Increased tearing . Insomnia . Runny nose . Sweating . Yawning  Late symptoms of withdrawal may include: . Abdominal cramping . Diarrhea . Dilated pupils . Goose bumps . Nausea . Vomiting  Will I experience withdrawals? Due to the slow nature of the taper, it is very unlikely that you will experience any.  What is a slow taper? Taper: refers to the gradual decrease in dose.  ___________________________________________________________________________________________     

## 2019-12-13 LAB — TOXASSURE SELECT 13 (MW), URINE

## 2019-12-13 NOTE — Progress Notes (Signed)
Virtual Visit via Telephone Note  I connected with Keyshia Orwick on 12/14/19 at 11:00 AM EDT by telephone and verified that I am speaking with the correct person using two identifiers.  Location: Patient: Home Provider: [facility] - [address], [place], [place], [place] [address]   I discussed the limitations, risks, security and privacy concerns of performing an evaluation and management service by telephone and the availability of in person appointments. I also discussed with the patient that there may be a patient responsible charge related to this service. The patient expressed understanding and agreed to proceed.  Patient consented to consult via telephone: Yes People present and their role in pt care: Pt     History of Present Illness:  69 year old female never smoker followed in our office for chronic cough  PMH: Gastric bypass (2011 with diverticulum), subsequent gastric surgeries afterwards to adjust, suspected GERD, hypertension, IBS Smoker/ Smoking History: Never smoker Maintenance:  None  Pt of: Dr. Melvyn Novas  Chief complaint: 3 month follow up   69 year old female never smoker followed in our office for chronic cough.  Patient completing 39-monthfollow-up with our office.  She reports that overall she has been doing well since last being seen.  She has had a couple cyclical coughing episodes that have lasted 3 to 4 days at a time.  She reports that over the last 2 weeks she has been stable without any cough.  The cough continues to be dry.  She is unable to find a pattern to her cough.  She manages her cough by using Tessalon Perles as well as promethazine cough syrup.  She reports she has enough TBest boy  She needs a refill of promethazine cough syrup.  Observations/Objective:  07/14/2019-bronchoscopy Fungal-Candida tropicalis [employer]-negative BAL-normal respiratory flora  06/25/2018-CT chest high-res-negative for interstitial lung disease,  minimal air trapping, probable small left adrenal adenoma, bronchiectasis and volume loss in right middle lobe  09/24/2018 - Cough ROS:  When to the symptoms start: Fall / 2019  How are you today: worse over past 2 days   Have you had fever/sore throat (first 5 to 7 days of URI) or Have you had cough/nasal congestion (10 to 14 days of URI) : dry cough Have you used anything to treat the cough, as anything improved : cough syrup, vaporizer, humidifier  Is it a dry or wet cough: dry cough  Does the cough happen when your breathing or when you breathe out:  out Other any triggers to your cough, or any aggravating factors: rain / dampness, worse when sitting, laying down, dust mites  Daily antihistamine: claritin  GERD treatment: protonix bid  Singulair: yes   Cough checklist (bolded indicates presence):  Adherence, acid reflux, ACE inhibitor, active sinus disease, active smoking, adverse effects of medications (amiodarone/Macrodantin/bb), alpha 1, allergies, aspiration, anxiety, bronchiectasis, congestive heart failure (diastolic)   Social History   Tobacco Use  Smoking Status Never Smoker  Smokeless Tobacco Never Used   Immunization History  Administered Date(s) Administered  . Hepatitis B 04/12/2009  . Influenza, High Dose Seasonal PF 05/12/2018, 05/10/2019  . Influenza-Unspecified 05/12/2013, 05/13/2015  . Pneumococcal-Unspecified 05/12/2013  . Tdap 12/11/2007, 07/01/2017  . Zoster Recombinat (Shingrix) 03/10/2019, 06/02/2019    Assessment and Plan:  Bronchiectasis without complication ([place] Plan: Continue flutter valve  Allergic rhinitis Plan: Continue Claritin daily Continue nasal saline rinses 1-2 times a day Continue lifestyle measures for management of dust mite allergy In case mattress as well as pillowcases Dry  pillows once a week and dryer Continue to use air filtration system in the house   Chronic pain syndrome Plan: Continue follow-up with pain  management  Chronic cough Plan: Continue Tessalon Perles Continue promethazine cough syrup Continue cough recommendations as previously outlined   Follow Up Instructions:  Return in about 2 months (around 02/13/2020), or if symptoms worsen or fail to improve, for Follow up with Dr. Melvyn Novas.   I discussed the assessment and treatment plan with the patient. The patient was provided an opportunity to ask questions and all were answered. The patient agreed with the plan and demonstrated an understanding of the instructions.   The patient was advised to call back or seek an in-person evaluation if the symptoms worsen or if the condition fails to improve as anticipated.  I provided 23 minutes of non-face-to-face time during this encounter.   Lauraine Rinne, NP

## 2019-12-14 ENCOUNTER — Ambulatory Visit (INDEPENDENT_AMBULATORY_CARE_PROVIDER_SITE_OTHER): Payer: Medicare Other | Admitting: Pulmonary Disease

## 2019-12-14 ENCOUNTER — Ambulatory Visit: Payer: Medicare Other | Admitting: Internal Medicine

## 2019-12-14 ENCOUNTER — Encounter: Payer: Self-pay | Admitting: Pulmonary Disease

## 2019-12-14 ENCOUNTER — Other Ambulatory Visit: Payer: Self-pay

## 2019-12-14 DIAGNOSIS — G894 Chronic pain syndrome: Secondary | ICD-10-CM

## 2019-12-14 DIAGNOSIS — R053 Chronic cough: Secondary | ICD-10-CM

## 2019-12-14 DIAGNOSIS — R05 Cough: Secondary | ICD-10-CM | POA: Diagnosis not present

## 2019-12-14 DIAGNOSIS — J309 Allergic rhinitis, unspecified: Secondary | ICD-10-CM

## 2019-12-14 DIAGNOSIS — J479 Bronchiectasis, uncomplicated: Secondary | ICD-10-CM

## 2019-12-14 MED ORDER — PROMETHAZINE-DM 6.25-15 MG/5ML PO SYRP: 5.0000 mL | ORAL_SOLUTION | Freq: Four times a day (QID) | ORAL | 0 refills | Status: DC | PRN

## 2019-12-14 NOTE — Assessment & Plan Note (Signed)
Plan: Continue Tessalon Perles Continue promethazine cough syrup Continue cough recommendations as previously outlined

## 2019-12-14 NOTE — Patient Instructions (Addendum)
You were seen today by Lauraine Rinne, NP  for:   1. Chronic cough  - promethazine-dextromethorphan (PROMETHAZINE-DM) 6.25-15 MG/5ML syrup; Take 5 mLs by mouth 4 (four) times daily as needed for cough.  Dispense: 240 mL; Refill: 0  Continue Tessalon Perles every 8 hours as needed for cough management  If you start having more coughing episodes please start taking Tessalon Perles 1 tablet daily and you can always repeat every 8 hours as needed to see if we can obtain better control of your cough   We recommend today:   Meds ordered this encounter  Medications  . promethazine-dextromethorphan (PROMETHAZINE-DM) 6.25-15 MG/5ML syrup    Sig: Take 5 mLs by mouth 4 (four) times daily as needed for cough.    Dispense:  240 mL    Refill:  0    Patient needs appointment before additional refill will be issued.    Follow Up:    Return in about 2 months (around 02/13/2020), or if symptoms worsen or fail to improve, for Follow up with Dr. Melvyn Novas.   Please do your part to reduce the spread of COVID-19:      Reduce your risk of any infection  and COVID19 by using the similar precautions used for avoiding the common cold or flu:  Marland Kitchen Wash your hands often with soap and warm water for at least 20 seconds.  If soap and water are not readily available, use an alcohol-based hand sanitizer with at least 60% alcohol.  . If coughing or sneezing, cover your mouth and nose by coughing or sneezing into the elbow areas of your shirt or coat, into a tissue or into your sleeve (not your hands). Langley Gauss A MASK when in public  . Avoid shaking hands with others and consider head nods or verbal greetings only. . Avoid touching your eyes, nose, or mouth with unwashed hands.  . Avoid close contact with people who are sick. . Avoid places or events with large numbers of people in one location, like concerts or sporting events. . If you have some symptoms but not all symptoms, continue to monitor at home and seek medical  attention if your symptoms worsen. . If you are having a medical emergency, call 911.   [place] / e-Visit: [email or web address]         [facility]: [facility]: [id number]                   [facility]: [id number]     It is flu season:   >>> Best ways to protect herself from the flu: Receive the yearly flu vaccine, practice good hand hygiene washing with soap and also using hand sanitizer when available, eat a nutritious meals, get adequate rest, hydrate appropriately   Please contact the office if your symptoms worsen or you have concerns that you are not improving.   Thank you for choosing [facility] for your healthcare, and for allowing [place] to partner with you on your healthcare journey. I am thankful to be able to provide care to you today.   Wyn Quaker FNP-C

## 2019-12-14 NOTE — Assessment & Plan Note (Signed)
Plan: Continue flutter valve

## 2019-12-14 NOTE — Assessment & Plan Note (Signed)
Plan: Continue Claritin daily Continue nasal saline rinses 1-2 times a day Continue lifestyle measures for management of dust mite allergy In case mattress as well as pillowcases Dry pillows once a week and dryer Continue to use air filtration system in the house

## 2019-12-14 NOTE — Assessment & Plan Note (Signed)
Plan: Continue follow-up with pain management 

## 2019-12-15 ENCOUNTER — Telehealth: Payer: Medicare Other | Admitting: Pain Medicine

## 2019-12-15 ENCOUNTER — Telehealth: Payer: Self-pay

## 2019-12-15 NOTE — Telephone Encounter (Signed)
Patient had a televisit yesterday with Aaron Edelman. He would like for her to follow up with MW in July 2021. I called the patient to get her scheduled but she did not answer. Left message for her to call back.

## 2020-01-11 ENCOUNTER — Telehealth: Payer: Self-pay | Admitting: Internal Medicine

## 2020-01-11 DIAGNOSIS — R053 Chronic cough: Secondary | ICD-10-CM

## 2020-01-11 NOTE — Telephone Encounter (Signed)
Ok to give enough refills until she can make appt to see me with all meds in hand

## 2020-01-11 NOTE — Telephone Encounter (Signed)
Attempted to call pt but unable to reach. Left message for pt to return call. Pt does not have a f/u scheduled so we need to get that appt scheduled for pt.

## 2020-01-11 NOTE — Telephone Encounter (Signed)
Called and spoke with pt. Pt is requesting to have her benzonatate and promethazine cough syrup refilled. She states she is getting ready to go out of town for 10 days and does not want to risk running out of the meds while she is out of town.  Dr. Melvyn Novas, please advise on this for pt.

## 2020-01-12 MED ORDER — PROMETHAZINE-DM 6.25-15 MG/5ML PO SYRP: 5.0000 mL | ORAL_SOLUTION | Freq: Four times a day (QID) | ORAL | 0 refills | Status: DC | PRN

## 2020-01-12 MED ORDER — BENZONATATE 200 MG PO CAPS: 200.0000 mg | ORAL_CAPSULE | Freq: Three times a day (TID) | ORAL | 1 refills | Status: DC | PRN

## 2020-01-12 NOTE — Telephone Encounter (Signed)
I spoke with pt, she made an appt with MW on 02/25/2020 at 3:45 pm. I sent in the refills for promethazine cough syrup and tessalon to her pharmacy. Nothing further is needed.

## 2020-01-13 ENCOUNTER — Telehealth: Payer: Self-pay | Admitting: Pain Medicine

## 2020-01-13 NOTE — Telephone Encounter (Signed)
Kari Gibbs states she called her pharmacy to have them fill her morphine to pick up Saturday. They stated that there was 2 scripts sent in for July but none for June. She needs this to pick up on Saturday. Also would like someone to call her when this is done.

## 2020-01-13 NOTE — Telephone Encounter (Signed)
Called [facility] pharmacy to check on Rx for January 15, 2020. Spoke with Shirlean Mylar and she double checked and she does have an Rx for June and July.  She will call patient and let her know this information and get it ready to fill.

## 2020-02-25 ENCOUNTER — Encounter: Payer: Self-pay | Admitting: Internal Medicine

## 2020-02-25 ENCOUNTER — Ambulatory Visit: Payer: Medicare Other | Admitting: Internal Medicine

## 2020-02-25 ENCOUNTER — Other Ambulatory Visit: Payer: Self-pay

## 2020-02-25 DIAGNOSIS — R05 Cough: Secondary | ICD-10-CM | POA: Diagnosis not present

## 2020-02-25 DIAGNOSIS — R053 Chronic cough: Secondary | ICD-10-CM

## 2020-02-25 MED ORDER — PROMETHAZINE-DM 6.25-15 MG/5ML PO SYRP: 5.0000 mL | ORAL_SOLUTION | Freq: Four times a day (QID) | ORAL | 1 refills | Status: DC | PRN

## 2020-02-25 NOTE — Patient Instructions (Addendum)
I agree with gabapentin to 400 mg three times daily   Treat the cough aggressively when it starts with promethazine dm and tessalon   Finish up with Dr Michail Sermon then call for referal to Dr Carol Ada [facility] voice center
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
## 2020-02-26 ENCOUNTER — Encounter: Payer: Self-pay | Admitting: Internal Medicine

## 2020-02-26 NOTE — Assessment & Plan Note (Addendum)
Onset ? Around 2009 prev eval by Dr Asencion Noble better off ACEi  - Allergy eval done early [place] for rhinitis and recurrent sinusitis pos only dust > avoidance. -Spirometry 06/03/18 nl  - HRCT chest 06/22/18 1. Negative for interstitial lung disease. 2. Minimal air trapping, indicative of small airways disease - Dg UGI 04/26/19 1. Esophageal dysmotility. 2. 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 Fob 07/14/19 Laryngeal edema.  - Hyperplastic changes were seen in the larynx.   -  tracheomalacia/ RML  BAL no eos/ neg cty/ few candida   Suspect this is cough is really multifactorial.  She may have some tracheomalacia but her problem occurs in fits that suggests a cyclical upper airway cough.  Of the three most common causes of  Sub-acute / recurrent or chronic cough, only one (GERD)  can actually contribute to/ trigger  the other two (asthma and post nasal drip syndrome)  and perpetuate the cylce of cough.  While not intuitively obvious, many patients with chronic low grade reflux do not cough until there is a primary insult that disturbs the protective epithelial barrier and exposes sensitive nerve endings.   This is typically viral but can due to PNDS and  either may apply here.   The point is that once this occurs, it is difficult to eliminate the cycle  using anything but a maximally effective acid suppression regimen at least in the short run, accompanied by an appropriate diet to address non acid GERD and control / eliminate the cough itself for at least 3 days with tessalon and phenergan dm.  In addition she should finish her follow-up with Dr. Michail Sermon and then consider referral to Dr. Carol Ada.  In the meantime she can certainly increase the gabapentin to 4 mg 3 times a day since it is helping her chronic pain as well.  Pulmonary follow-up is as needed.         Each maintenance medication was reviewed in detail including emphasizing most importantly the  difference between maintenance and prns and under what circumstances the prns are to be triggered using an action plan format where appropriate.  Total time for H and P, chart review, counseling, teaching device and generating customized AVS unique to this summary final  office visit / charting = 20 min

## 2020-03-07 NOTE — Progress Notes (Signed)
PROVIDER NOTE: Information contained herein reflects review and annotations entered in association with encounter. Interpretation of such information and data should be left to medically-trained personnel. Information provided to patient can be located elsewhere in the medical record under "Patient Instructions". Document created using STT-dictation technology, any transcriptional errors that may result from process are unintentional.    Patient: Kari Gibbs  Service Category: E/M  Provider: Gaspar Cola, MD  DOB: June 17, 1951  DOS: 03/08/2020  Specialty: Interventional Pain Management  MRN: [id number]  Setting: Ambulatory outpatient  PCP: Cari Caraway, MD  Type: Established Patient    Referring Provider: Cari Caraway, MD  Location: Office  Delivery: Face-to-face     HPI  Reason for encounter: [name], a 69 y.o. year old female, is here today for evaluation and management of her Chronic pain syndrome [G89.4]. [name] primary complain today is Flank Pain (left) Last encounter: Practice (01/13/2020). My last encounter with her was on 68/10/2019. Pertinent problems: Kari Gibbs has Fracture of rib (4th and 8th rib) (Left); Chronic pain syndrome; Chronic flank pain (Primary Area of Pain) (Left); Chronic thoracic radicular pain (Left-sided); Chronic occipital neuralgia (Left); Neuropathic pain; Neurogenic pain; History of rib fracture (4th and 8th rib) (Left); Vertebral body hemangioma (T11); Metatarsalgia of foot (Right); Chronic abdominal pain (Bilateral) (L>R) (possible left-sided thoracic radiculopathy); Osteoarthritis; Chronic neck pain (Bilateral) (L>R); Cervical facet syndrome (Bilateral) (L>R); Muscle spasticity; Tremors of nervous system; Muscle twitching; Cervical spondylosis; Chronic low back pain (Bilateral) w/o sciatica; Acute costochondritis (Left); Chronic low back pain (Bilateral) w/ sciatica (Bilateral); Chronic sacroiliac joint pain (Bilateral); Other specified  dorsopathies, sacral and sacrococcygeal region; and Chronic sacroiliac joint somatic dysfunction (Bilateral) on their pertinent problem list. Pain Assessment: Severity of Chronic pain is reported as a 5 /10. Location: Flank Left/abodomin. Onset: More than a month ago. Quality: Pressure, Stabbing. Timing: Intermittent. Modifying factor(s): lying still and heat. Vitals:  height is 5' 5.5" (1.664 m) and weight is 205 lb (93 kg) (abnormal). Her temporal temperature is 97.4 F (36.3 C) (abnormal). Her blood pressure is 114/62 (abnormal) and her pulse is 96. Her respiration is 18 and oxygen saturation is 96%.    The patient indicates doing well with the current medication regimen. No adverse reactions or side effects reported to the medications.  The patient is still having some problems with her breathing, which are associated to her usual medical problems rather than a specific complication associated with her pain medicines.  However, I have reminded the patient that all opioid analgesics can cause a certain degree of histamine release, which in turn may aggravate breathing problems by causing a certain degree of bronchoconstriction.  Today the patient comes in interested in getting more information regarding "Drug Holidays".  I took the opportunity to explain the concept of tolerance and how "Drug Holidays" help control it.  I detailed how to do a slow taper to avoid withdrawals.  The patient wants to proceed with the drug holiday, however, she indicates that for the next 3 months or so she will be dealing with the state sale of her parents and may have difficulty getting all of the prescriptions to we have her right before the taper, from the same pharmacy.  At the end, we decided to hold on the "Drug Holiday" until her next visit, approximately 90 days from now.  Pharmacotherapy Assessment   Analgesic: MS Contin 30 mg, 1 tab PO q 12 hrs (60 mg/day of morphine) MME/day:60 mg/day.   Monitoring: [place] PMP:  PDMP reviewed during this encounter.       Pharmacotherapy: No side-effects or adverse reactions reported. Compliance: No problems identified. Effectiveness: Clinically acceptable.  Chauncey Fischer, RN  03/08/2020  1:50 PM  Sign when Signing Visit Nursing Pain Medication Assessment:  Safety precautions to be maintained throughout the outpatient stay will include: orient to surroundings, keep bed in low position, maintain call bell within reach at all times, provide assistance with transfer out of bed and ambulation.  Medication Inspection Compliance: Pill count conducted under aseptic conditions, in front of the patient. Neither the pills nor the bottle was removed from the patient's sight at any time. Once count was completed pills were immediately returned to the patient in their original bottle.  Medication: Morphine ER (MSContin) Pill/Patch Count: 13 of 60 pills remain Pill/Patch Appearance: Markings consistent with prescribed medication Bottle Appearance: Standard pharmacy container. Clearly labeled. Filled Date: 7 / 5 / 21 Last Medication intake:  TodaySafety precautions to be maintained throughout the outpatient stay will include: orient to surroundings, keep bed in low position, maintain call bell within reach at all times, provide assistance with transfer out of bed and ambulation.     UDS:  Summary  Date Value Ref Range Status  12/09/2019 Note  Final    Comment:    ==================================================================== [employer] Select 13 (MW) ==================================================================== Test                             Result       Flag       Units Drug Present and Declared for Prescription Verification   Morphine                       >7937        EXPECTED   ng/mg creat   Normorphine                    913          EXPECTED   ng/mg creat    Potential sources of large amounts of morphine in the absence of    codeine include administration  of morphine or use of heroin.    Normorphine is an expected metabolite of morphine.   Hydromorphone                  309          EXPECTED   ng/mg creat    Hydromorphone may be present as a metabolite of morphine;    concentrations of hydromorphone rarely exceed 5% of the morphine    concentration when this is the source of hydromorphone. ==================================================================== Test                      Result    Flag   Units      Ref Range   Creatinine              126              mg/dL      >=20 ==================================================================== Declared Medications:  The flagging and interpretation on this report are based on the  following declared medications.  Unexpected results may arise from  inaccuracies in the declared medications.  **Note: The testing scope of this panel includes these medications:  Morphine  **Note: The testing scope of this panel does not include the  following reported medications:  Azelastine  Benzonatate  Buspirone  Cholecalciferol  Cranberry  Cyanocobalamin  Dextromethorphan  Esomeprazole (Nexium)  Gabapentin  Irbesartan  Iron  Loratadine  Magnesium  Multivitamin  Naloxone  Nifedipine  Probiotic  Promethazine  Topical Diclofenac  Venlafaxine  Vitamin C ==================================================================== For clinical consultation, please call [phone]. ====================================================================      ROS  Constitutional: Denies any fever or chills Gastrointestinal: No reported hemesis, hematochezia, vomiting, or acute GI distress Musculoskeletal: Denies any acute onset joint swelling, redness, loss of ROM, or weakness Neurological: No reported episodes of acute onset apraxia, aphasia, dysarthria, agnosia, amnesia, paralysis, loss of coordination, or loss of consciousness  Medication Review  Biotin, Cranberry, Flutter, Iron-Vitamin C,  Magnesium, NIFEdipine, Peppermint Oil, Prenatal MV & Min w/FA-DHA, RA Probiotic Gummies, Vitamin D3, azelastine, benzonatate, busPIRone, cephALEXin, conjugated estrogens, diclofenac Sodium, diltiazem, esomeprazole, gabapentin, irbesartan, loratadine, montelukast, morphine, naloxone, potassium gluconate, promethazine-dextromethorphan, venlafaxine XR, and vitamin B-12  History Review  Allergy: Kari Gibbs is allergic to iohexol, ace inhibitors, losartan potassium, and neosporin [neomycin-bacitracin zn-polymyx]. Drug: Kari Gibbs  reports no history of drug use. Alcohol:  reports previous alcohol use. Tobacco:  reports that she has never smoked. She has never used smokeless tobacco. Social: Kari Gibbs  reports that she has never smoked. She has never used smokeless tobacco. She reports previous alcohol use. She reports that she does not use drugs. 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 Family: family history includes Alzheimer's disease in her father;  Hypertension in her mother.  Laboratory Chemistry Profile   Renal Lab Results  Component Value Date   BUN 20 01/04/2016   CREATININE 0.76 01/04/2016   GFRAA >60 01/04/2016   GFRNONAA >60 01/04/2016     Hepatic Lab Results  Component Value Date   AST 33 01/04/2016   ALT 26 01/04/2016   ALBUMIN 4.2 01/04/2016   ALKPHOS 97 01/04/2016   AMYLASE 30 02/27/2010   LIPASE 22 10/12/2012     Electrolytes Lab Results  Component Value Date   NA 139 01/04/2016   K 4.6 01/04/2016   CL 99 (L) 01/04/2016   CALCIUM 9.7 01/04/2016   MG 2.1 01/04/2016     Bone Lab Results  Component Value Date   25OHVITD1 49 01/04/2016   25OHVITD2 25 01/04/2016   25OHVITD3 24 01/04/2016     Inflammation (CRP: Acute Phase) (ESR: Chronic Phase) Lab Results  Component Value Date   CRP <0.5 01/04/2016   ESRSEDRATE 17 01/04/2016       Note: Above Lab results reviewed.  Recent Imaging Review  DG BONE DENSITY (DXA) EXAM: DUAL X-RAY ABSORPTIOMETRY (DXA) FOR BONE MINERAL DENSITY  IMPRESSION: Referring Physician:  [facility] MCNEILL Your patient completed a BMD test using Lunar IDXA DXA system ( analysis version: 16 ) manufactured by [employer]. Technologist: KT PATIENT: Name: Cecilie, Heidel Patient  ID: [id number] Birth Date: 05-Jun-1951 Height: 64.7 in. Sex: Female Measured: 10/06/2019 Weight: 215.6 lbs. Indications: Bariatric surgery, Caucasian, Depression, Effexor, Estrogen Deficient, Family History of Osteoporosis, Gabapentin, Height Loss (781.91), History of Fracture (Adult) (V15.51), Hysterectomy, Nexium, Postmenopausal Fractures: Ankle Treatments: Prolia, Vitamin D (E933.5)  ASSESSMENT: The BMD measured at Femur Neck Right is 0.779 g/cm2 with a T-score of -1.9. This patient is considered osteopenic according to [facility]) criteria.  The scan quality is good. Lumbar spine was not utilized due to advanced degenerative changes. Patient does not meet criteria for  FRAX due to Prolia.  Site Region Measured Date Measured Age YA BMD Significant CHANGE T-score DualFemur Neck Right 10/06/2019 67.9 -1.9 0.779 g/cm2  DualFemur Total Mean 10/06/2019 67.9 -1.5 0.826 g/cm2  Left Forearm Radius 33% 10/06/2019 67.9 -[age] 0.808 g/cm2  World Health Organization [facility]) criteria for post-menopausal, Caucasian Women: Normal       T-score at or above -1 SD Osteopenia   T-score between -1 and -2.5 SD Osteoporosis T-score at or below -2.5 SD  RECOMMENDATION: 1. All patients should optimize calcium and vitamin D intake. 2. Consider FDA approved medical therapies in postmenopausal women and men aged 53 years and older, based on the following: a. A hip or vertebral (clinical or morphometric) fracture b. T- score < or = -2.5 at the femoral neck or spine after appropriate evaluation to exclude secondary causes c. Low bone mass (T-score between -1.0 and -2.5 at the femoral neck or spine) and a 10 year probability of a hip fracture > or = 3% or a 10 year probability of a major osteoporosis-related fracture > or = 20% based on the [place]-adapted WHO algorithm d. Clinician judgment and/or patient preferences may indicate treatment for people with 10-year fracture probabilities above or below these levels FOLLOW-UP: Patients with diagnosis of osteoporosis or at high risk for fracture should have regular bone mineral density tests. For patients eligible for Medicare, routine testing is allowed once every 2 years. The testing frequency can be increased to one year for patients who have rapidly progressing disease, those who are receiving or discontinuing medical therapy to restore bone mass, or have additional risk factors.  I have reviewed this report and agree with the above findings.  [facility]  Electronically Signed   By: Lowella Grip III M.D.   On: 10/06/2019 11:02 Note: Reviewed        Physical Exam  General appearance: Well nourished, well  developed, and well hydrated. In no apparent acute distress Mental status: Alert, oriented x 3 (person, place, & time)       Respiratory: No evidence of acute respiratory distress Eyes: PERLA Vitals: BP (!) 114/62 (BP Location: Left Arm, Patient Position: Sitting, Cuff Size: Large)   Pulse 96   Temp (!) 97.4 F (36.3 C) (Temporal)   Resp 18   Ht 5' 5.5" (1.664 m)   Wt (!) 205 lb (93 kg)   SpO2 96%   BMI 33.59 kg/m  BMI: Estimated body mass index is 33.59 kg/m as calculated from the following:   Height as of this encounter: 5' 5.5" (1.664 m).   Weight as of this encounter: 205 lb (93 kg). Ideal: Ideal body weight: 58.2 kg (128 lb 3.2 oz) Adjusted ideal body weight: 72.1 kg (158 lb 14.7 oz)  Assessment   Status Diagnosis  Controlled Controlled Controlled 1. Chronic pain syndrome   2. Chronic flank pain (Primary Area of Pain) (Left)   3. Chronic low back pain (  Bilateral) w/ sciatica (Bilateral)   4. Pharmacologic therapy   5. Neurogenic pain      Updated Problems: No problems updated.  Plan of Care  Problem-specific:  No problem-specific Assessment & Plan notes found for this encounter.  Kari Gibbs has a current medication list which includes the following long-term medication(s): diclofenac sodium, diltiazem, loratadine, nifedipine, [START ON 03/15/2020] gabapentin, [START ON 03/15/2020] morphine, [START ON 04/14/2020] morphine, and [START ON 05/14/2020] morphine.  Pharmacotherapy (Medications Ordered): Meds ordered this encounter  Medications  . morphine (MS CONTIN) 30 MG 12 hr tablet    Sig: Take 1 tablet (30 mg total) by mouth every 12 (twelve) hours. Must last 30 days    Dispense:  60 tablet    Refill:  0    Chronic Pain: STOP Act (Not applicable) Fill 1 day early if closed on refill date. Do not fill until: 03/15/2020. To last until: 04/14/2020. Avoid benzodiazepines within 8 hours of opioids  . morphine (MS CONTIN) 30 MG 12 hr tablet    Sig: Take 1 tablet (30 mg  total) by mouth every 12 (twelve) hours. Must last 30 days    Dispense:  60 tablet    Refill:  0    Chronic Pain: STOP Act (Not applicable) Fill 1 day early if closed on refill date. Do not fill until: 04/14/2020. To last until: 05/14/2020. Avoid benzodiazepines within 8 hours of opioids  . morphine (MS CONTIN) 30 MG 12 hr tablet    Sig: Take 1 tablet (30 mg total) by mouth every 12 (twelve) hours. Must last 30 days    Dispense:  60 tablet    Refill:  0    Chronic Pain: STOP Act (Not applicable) Fill 1 day early if closed on refill date. Do not fill until: 05/14/2020. To last until: 06/13/2020. Avoid benzodiazepines within 8 hours of opioids  . gabapentin (NEURONTIN) 400 MG capsule    Sig: Take 1-2 capsules (400-800 mg total) by mouth 2 (two) times daily.    Dispense:  120 capsule    Refill:  2    Fill one day early if pharmacy is closed on scheduled refill date. May substitute for generic if available.   Orders:  No orders of the defined types were placed in this encounter.  Follow-up plan:   Return in about 3 months (around 06/12/2020) for F2F encounter, 20-min, MM (on eval day) to start "Drug Holiday".      Interventional management options:  Considering:   Diagnosticleft costochondral joint injection  Possible left intercostal nerve RFA  Therapeutic Left greater occipital nerve RFA Diagnostic left C2 + TON nerve block Possible left C2 + TON RFA    Palliative PRN treatment(s):   Palliative left intercostal nerve blocks (T5 to T8) #2  Palliative left GONB block #3  Palliative bilateral sacroiliac joint block #2     Recent Visits No visits were found meeting these conditions. Showing recent visits within past 90 days and meeting all other requirements Today's Visits Date Type Provider Dept  03/08/20 Office Visit [name], MD [facility]  Showing today's visits and meeting all other requirements Future Appointments No visits were found meeting these  conditions. Showing future appointments within next 90 days and meeting all other requirements  I discussed the assessment and treatment plan with the patient. The patient was provided an opportunity to ask questions and all were answered. The patient agreed with the plan and demonstrated an understanding of the instructions.  Patient advised  to call back or seek an in-person evaluation if the symptoms or condition worsens.  Duration of encounter: 30 minutes.  Note by: Gaspar Cola, MD Date: 03/08/2020; Time: 2:39 PM

## 2020-03-07 NOTE — Patient Instructions (Signed)
____________________________________________________________________________________________  Drug Holidays (Slow)  What is a "Drug Holiday"? Drug Holiday: is the name given to the period of time during which a patient stops taking a medication(s) for the purpose of eliminating tolerance to the drug.  Benefits . Improved effectiveness of opioids. . Decreased opioid dose needed to achieve benefits. . Improved pain with lesser dose.  What is tolerance? Tolerance: is the progressive decreased in effectiveness of a drug due to its repetitive use. With repetitive use, the body gets use to the medication and as a consequence, it loses its effectiveness. This is a common problem seen with opioid pain medications. As a result, a larger dose of the drug is needed to achieve the same effect that used to be obtained with a smaller dose.  How long should a "Drug Holiday" last? You should stay off of the pain medicine for at least 14 consecutive days. (2 weeks)  Should I stop the medicine "cold turkey"? No. You should always coordinate with your Pain Specialist so that he/she can provide you with the correct medication dose to make the transition as smoothly as possible.  How do I stop the medicine? Slowly. You will be instructed to decrease the daily amount of pills that you take by one (1) pill every seven (7) days. This is called a "slow downward taper" of your dose. For example: if you normally take four (4) pills per day, you will be asked to drop this dose to three (3) pills per day for seven (7) days, then to two (2) pills per day for seven (7) days, then to one (1) per day for seven (7) days, and at the end of those last seven (7) days, this is when the "Drug Holiday" would start.   Will I have withdrawals? By doing a "slow downward taper" like this one, it is unlikely that you will experience any significant withdrawal symptoms. Typically, what triggers withdrawals is the sudden stop of a high  dose opioid therapy. Withdrawals can usually be avoided by slowly decreasing the dose over a prolonged period of time. If you do not follow these instructions and decide to stop your medication abruptly, withdrawals may be possible.  What are withdrawals? Withdrawals: refers to the wide range of symptoms that occur after stopping or dramatically reducing opiate drugs after heavy and prolonged use. Withdrawal symptoms do not occur to patients that use low dose opioids, or those who take the medication sporadically. Contrary to benzodiazepine (example: Valium, Xanax, etc.) or alcohol withdrawals ("Delirium Tremens"), opioid withdrawals are not lethal. Withdrawals are the physical manifestation of the body getting rid of the excess receptors.  Expected Symptoms Early symptoms of withdrawal may include: . Agitation . Anxiety . Muscle aches . Increased tearing . Insomnia . Runny nose . Sweating . Yawning  Late symptoms of withdrawal may include: . Abdominal cramping . Diarrhea . Dilated pupils . Goose bumps . Nausea . Vomiting  Will I experience withdrawals? Due to the slow nature of the taper, it is very unlikely that you will experience any.  What is a slow taper? Taper: refers to the gradual decrease in dose.  (Last update: 03/01/2020) ____________________________________________________________________________________________    ____________________________________________________________________________________________  Medication Rules  Purpose: To inform patients, and their family members, of our rules and regulations.  Applies to: All patients receiving prescriptions (written or electronic).  Pharmacy of record: Pharmacy where electronic prescriptions will be sent. If written prescriptions are taken to a different pharmacy, please inform the nursing staff. The pharmacy   listed in the electronic medical record should be the one where you would like electronic prescriptions  to be sent.  Electronic prescriptions: In compliance with the [place] Strengthen Opioid Misuse Prevention (STOP) Act of 2017 (Session Law 2017-74/H243), effective August 12, 2018, all controlled substances must be electronically prescribed. Calling prescriptions to the pharmacy will cease to exist.  Prescription refills: Only during scheduled appointments. Applies to all prescriptions.  NOTE: The following applies primarily to controlled substances (Opioid* Pain Medications).   Type of encounter (visit): For patients receiving controlled substances, face-to-face visits are required. (Not an option or up to the patient.)  Patient's responsibilities: 1. Pain Pills: Bring all pain pills to every appointment (except for procedure appointments). 2. Pill Bottles: Bring pills in original pharmacy bottle. Always bring the newest bottle. Bring bottle, even if empty. 3. Medication refills: You are responsible for knowing and keeping track of what medications you take and those you need refilled. The day before your appointment: write a list of all prescriptions that need to be refilled. The day of the appointment: give the list to the admitting nurse. Prescriptions will be written only during appointments. No prescriptions will be written on procedure days. If you forget a medication: it will not be "Called in", "Faxed", or "electronically sent". You will need to get another appointment to get these prescribed. No early refills. Do not call asking to have your prescription filled early. 4. Prescription Accuracy: You are responsible for carefully inspecting your prescriptions before leaving our office. Have the discharge nurse carefully go over each prescription with you, before taking them home. Make sure that your name is accurately spelled, that your address is correct. Check the name and dose of your medication to make sure it is accurate. Check the number of pills, and the written instructions to  make sure they are clear and accurate. Make sure that you are given enough medication to last until your next medication refill appointment. 5. Taking Medication: Take medication as prescribed. When it comes to controlled substances, taking less pills or less frequently than prescribed is permitted and encouraged. Never take more pills than instructed. Never take medication more frequently than prescribed.  6. Inform other Doctors: Always inform, all of your healthcare providers, of all the medications you take. 7. Pain Medication from other Providers: You are not allowed to accept any additional pain medication from any other Doctor or Healthcare provider. There are two exceptions to this rule. (see below) In the event that you require additional pain medication, you are responsible for notifying us, as stated below. 8. Medication Agreement: You are responsible for carefully reading and following our Medication Agreement. This must be signed before receiving any prescriptions from our practice. Safely store a copy of your signed Agreement. Violations to the Agreement will result in no further prescriptions. (Additional copies of our Medication Agreement are available upon request.) 9. Laws, Rules, & Regulations: All patients are expected to follow all Federal and State Laws, Statutes, Rules, & Regulations. Ignorance of the Laws does not constitute a valid excuse.  10. Illegal drugs and Controlled Substances: The use of illegal substances (including, but not limited to marijuana and its derivatives) and/or the illegal use of any controlled substances is strictly prohibited. Violation of this rule may result in the immediate and permanent discontinuation of any and all prescriptions being written by our practice. The use of any illegal substances is prohibited. 11. Adopted CDC guidelines & recommendations: Target dosing levels will be at or   below 60 MME/day. Use of benzodiazepines** is not  recommended.  Exceptions: There are only two exceptions to the rule of not receiving pain medications from other Healthcare Providers. 1. Exception #1 (Emergencies): In the event of an emergency (i.e.: accident requiring emergency care), you are allowed to receive additional pain medication. However, you are responsible for: As soon as you are able, call our office [phone], at any time of the day or night, and leave a message stating your name, the date and nature of the emergency, and the name and dose of the medication prescribed. In the event that your call is answered by a member of our staff, make sure to document and save the date, time, and the name of the person that took your information.  2. Exception #2 (Planned Surgery): In the event that you are scheduled by another doctor or dentist to have any type of surgery or procedure, you are allowed (for a period no longer than 30 days), to receive additional pain medication, for the acute post-op pain. However, in this case, you are responsible for picking up a copy of our "Post-op Pain Management for Surgeons" handout, and giving it to your surgeon or dentist. This document is available at our office, and does not require an appointment to obtain it. Simply go to our office during business hours (Monday-Thursday from 8:00 AM to 4:00 PM) (Friday 8:00 AM to 12:00 Noon) or if you have a scheduled appointment with us, prior to your surgery, and ask for it by name. In addition, you will need to provide us with your name, name of your surgeon, type of surgery, and date of procedure or surgery.  *Opioid medications include: morphine, codeine, oxycodone, oxymorphone, hydrocodone, hydromorphone, meperidine, tramadol, tapentadol, buprenorphine, fentanyl, methadone. **Benzodiazepine medications include: diazepam (Valium), alprazolam (Xanax), clonazepam (Klonopine), lorazepam (Ativan), clorazepate (Tranxene), chlordiazepoxide (Librium), estazolam (Prosom),  oxazepam (Serax), temazepam (Restoril), triazolam (Halcion) (Last updated: 10/09/2017) ____________________________________________________________________________________________   ____________________________________________________________________________________________  Medication Recommendations and Reminders  Applies to: All patients receiving prescriptions (written and/or electronic).  Medication Rules & Regulations: These rules and regulations exist for your safety and that of others. They are not flexible and neither are we. Dismissing or ignoring them will be considered "non-compliance" with medication therapy, resulting in complete and irreversible termination of such therapy. (See document titled "Medication Rules" for more details.) In all conscience, because of safety reasons, we cannot continue providing a therapy where the patient does not follow instructions.  Pharmacy of record:   Definition: This is the pharmacy where your electronic prescriptions will be sent.   We do not endorse any particular pharmacy, however, we have experienced problems with [facility] not securing enough medication supply for the community.  We do not restrict you in your choice of pharmacy. However, once we write for your prescriptions, we will NOT be re-sending more prescriptions to fix restricted supply problems created by your pharmacy, or your insurance.   The pharmacy listed in the electronic medical record should be the one where you want electronic prescriptions to be sent.  If you choose to change pharmacy, simply notify our nursing staff.  Recommendations:  Keep all of your pain medications in a safe place, under lock and key, even if you live alone. We will NOT replace lost, stolen, or damaged medication.  After you fill your prescription, take 1 week's worth of pills and put them away in a safe place. You should keep a separate, properly labeled bottle for this purpose. The remainder    should be kept in the original bottle. Use this as your primary supply, until it runs out. Once it's gone, then you know that you have 1 week's worth of medicine, and it is time to come in for a prescription refill. If you do this correctly, it is unlikely that you will ever run out of medicine.  To make sure that the above recommendation works, it is very important that you make sure your medication refill appointments are scheduled at least 1 week before you run out of medicine. To do this in an effective manner, make sure that you do not leave the office without scheduling your next medication management appointment. Always ask the nursing staff to show you in your prescription , when your medication will be running out. Then arrange for the receptionist to get you a return appointment, at least 7 days before you run out of medicine. Do not wait until you have 1 or 2 pills left, to come in. This is very poor planning and does not take into consideration that we may need to cancel appointments due to bad weather, sickness, or emergencies affecting our staff.  DO NOT ACCEPT A "Partial Fill": If for any reason your pharmacy does not have enough pills/tablets to completely fill or refill your prescription, do not allow for a "partial fill". The law allows the pharmacy to complete that prescription within 72 hours, without requiring a new prescription. If they do not fill the rest of your prescription within those 72 hours, you will need a separate prescription to fill the remaining amount, which we will NOT provide. If the reason for the partial fill is your insurance, you will need to talk to the pharmacist about payment alternatives for the remaining tablets, but again, DO NOT ACCEPT A PARTIAL FILL, unless you can trust your pharmacist to obtain the remainder of the pills within 72 hours.  Prescription refills and/or changes in medication(s):   Prescription refills, and/or changes in dose or medication,  will be conducted only during scheduled medication management appointments. (Applies to both, written and electronic prescriptions.)  No refills on procedure days. No medication will be changed or started on procedure days. No changes, adjustments, and/or refills will be conducted on a procedure day. Doing so will interfere with the diagnostic portion of the procedure.  No phone refills. No medications will be "called into the pharmacy".  No Fax refills.  No weekend refills.  No Holliday refills.  No after hours refills.  Remember:  Business hours are:  Monday to Thursday 8:00 AM to 4:00 PM Provider's Schedule: Jerome Otter, MD - Appointments are:  Medication management: Monday and Wednesday 8:00 AM to 4:00 PM Procedure day: Tuesday and Thursday 7:30 AM to 4:00 PM Bilal Lateef, MD - Appointments are:  Medication management: Tuesday and Thursday 8:00 AM to 4:00 PM Procedure day: Monday and Wednesday 7:30 AM to 4:00 PM (Last update: 03/01/2020) ____________________________________________________________________________________________   ____________________________________________________________________________________________  CANNABIDIOL (AKA: CBD Oil or Pills)  Applies to: All patients receiving prescriptions of controlled substances (written and/or electronic).  General Information: Cannabidiol (CBD), a derivative of Marijuana, was discovered in 1940. It is one of some 113 identified cannabinoids in cannabis (Marijuana) plants, accounting for up to 40% of the plant's extract. As of 2018, preliminary clinical research on cannabidiol included studies of anxiety, cognition, movement disorders, and pain.  Cannabidiol is consummed in multiple ways, including inhalation of cannabis smoke or vapor, as an aerosol spray into the cheek, and by mouth. It   may be supplied as CBD oil containing CBD as the active ingredient (no added tetrahydrocannabinol (THC) or terpenes), a full-plant  CBD-dominant hemp extract oil, capsules, dried cannabis, or as a liquid solution. CBD is thought not have the same psychoactivity as THC, and may affect the actions of THC. Studies suggest that CBD may interact with different biological targets, including cannabinoid receptors and other neurotransmitter receptors. As of 2018 the mechanism of action for its biological effects has not been determined.  In the [place], cannabidiol has a limited approval by the Food and Drug Administration (FDA) for treatment of only two types of epilepsy disorders. The side effects of long-term use of the drug include somnolence, decreased appetite, diarrhea, fatigue, malaise, weakness, sleeping problems, and others.  CBD remains a Schedule I drug prohibited for any use.  Legality: Some manufacturers ship CBD products nationally, an illegal action which the FDA has not enforced in 2018, with CBD remaining the subject of an FDA investigational new drug evaluation, and is not considered legal as a dietary supplement or food ingredient as of December 2018. Federal illegality has made it difficult historically to conduct research on CBD. CBD is openly sold in head shops and health food stores in some states where such sales have not been explicitly legalized.  Warning: Because it is not FDA approved for general use or treatment of pain, it is not required to undergo the same manufacturing controls as prescription drugs.  This means that the available cannabidiol (CBD) may be contaminated with THC.  If this is the case, it will trigger a positive urine drug screen (UDS) test for cannabinoids (Marijuana).  Because a positive UDS for illicit substances is a violation of our medication agreement, your opioid analgesics (pain medicine) may be permanently discontinued. (Last update: 03/01/2020) ____________________________________________________________________________________________    

## 2020-03-08 ENCOUNTER — Encounter: Payer: Self-pay | Admitting: Pain Medicine

## 2020-03-08 ENCOUNTER — Ambulatory Visit: Payer: Medicare Other | Attending: Pain Medicine | Admitting: Pain Medicine

## 2020-03-08 ENCOUNTER — Other Ambulatory Visit: Payer: Self-pay

## 2020-03-08 VITALS — BP 114/62 | HR 96 | Temp 97.4°F | Resp 18 | Ht 65.5 in | Wt 205.0 lb

## 2020-03-08 DIAGNOSIS — M792 Neuralgia and neuritis, unspecified: Secondary | ICD-10-CM | POA: Insufficient documentation

## 2020-03-08 DIAGNOSIS — G894 Chronic pain syndrome: Secondary | ICD-10-CM | POA: Diagnosis not present

## 2020-03-08 DIAGNOSIS — G8929 Other chronic pain: Secondary | ICD-10-CM | POA: Diagnosis present

## 2020-03-08 DIAGNOSIS — M5441 Lumbago with sciatica, right side: Secondary | ICD-10-CM

## 2020-03-08 DIAGNOSIS — R109 Unspecified abdominal pain: Secondary | ICD-10-CM

## 2020-03-08 DIAGNOSIS — M5442 Lumbago with sciatica, left side: Secondary | ICD-10-CM | POA: Insufficient documentation

## 2020-03-08 DIAGNOSIS — Z79899 Other long term (current) drug therapy: Secondary | ICD-10-CM | POA: Insufficient documentation

## 2020-03-08 MED ORDER — GABAPENTIN 400 MG PO CAPS: 400.0000 mg | ORAL_CAPSULE | Freq: Two times a day (BID) | ORAL | 2 refills | Status: DC

## 2020-03-08 MED ORDER — MORPHINE SULFATE ER 30 MG PO TBCR: 30.0000 mg | EXTENDED_RELEASE_TABLET | Freq: Two times a day (BID) | ORAL | 0 refills | Status: DC

## 2020-03-08 NOTE — Progress Notes (Signed)
Nursing Pain Medication Assessment:  Safety precautions to be maintained throughout the outpatient stay will include: orient to surroundings, keep bed in low position, maintain call bell within reach at all times, provide assistance with transfer out of bed and ambulation.  Medication Inspection Compliance: Pill count conducted under aseptic conditions, in front of the patient. Neither the pills nor the bottle was removed from the patient's sight at any time. Once count was completed pills were immediately returned to the patient in their original bottle.  Medication: Morphine ER (MSContin) Pill/Patch Count: 13 of 60 pills remain Pill/Patch Appearance: Markings consistent with prescribed medication Bottle Appearance: Standard pharmacy container. Clearly labeled. Filled Date: 7 / 5 / 21 Last Medication intake:  TodaySafety precautions to be maintained throughout the outpatient stay will include: orient to surroundings, keep bed in low position, maintain call bell within reach at all times, provide assistance with transfer out of bed and ambulation.

## 2020-03-09 ENCOUNTER — Encounter: Payer: Medicare Other | Admitting: Pain Medicine

## 2020-04-06 ENCOUNTER — Other Ambulatory Visit: Payer: Self-pay | Admitting: Orthopedic Surgery

## 2020-04-06 ENCOUNTER — Encounter (HOSPITAL_BASED_OUTPATIENT_CLINIC_OR_DEPARTMENT_OTHER): Payer: Self-pay | Admitting: Orthopedic Surgery

## 2020-04-06 ENCOUNTER — Other Ambulatory Visit: Payer: Self-pay

## 2020-04-07 ENCOUNTER — Encounter (HOSPITAL_BASED_OUTPATIENT_CLINIC_OR_DEPARTMENT_OTHER)
Admission: RE | Admit: 2020-04-07 | Discharge: 2020-04-07 | Disposition: A | Discharge status: Home / Self Care | Payer: Medicare Other | Source: Ambulatory Visit | Attending: Orthopedic Surgery | Admitting: Orthopedic Surgery

## 2020-04-07 ENCOUNTER — Telehealth: Payer: Self-pay

## 2020-04-07 ENCOUNTER — Other Ambulatory Visit (HOSPITAL_COMMUNITY)
Admission: RE | Admit: 2020-04-07 | Discharge: 2020-04-07 | Disposition: A | Discharge status: Home / Self Care | Payer: Medicare Other | Source: Ambulatory Visit | Attending: Orthopedic Surgery | Admitting: Orthopedic Surgery

## 2020-04-07 DIAGNOSIS — M479 Spondylosis, unspecified: Secondary | ICD-10-CM | POA: Diagnosis not present

## 2020-04-07 DIAGNOSIS — Z01812 Encounter for preprocedural laboratory examination: Secondary | ICD-10-CM | POA: Diagnosis present

## 2020-04-07 DIAGNOSIS — Z20822 Contact with and (suspected) exposure to covid-19: Secondary | ICD-10-CM | POA: Diagnosis not present

## 2020-04-07 DIAGNOSIS — Z881 Allergy status to other antibiotic agents status: Secondary | ICD-10-CM | POA: Diagnosis not present

## 2020-04-07 DIAGNOSIS — S52571A Other intraarticular fracture of lower end of right radius, initial encounter for closed fracture: Secondary | ICD-10-CM | POA: Diagnosis not present

## 2020-04-07 DIAGNOSIS — Z888 Allergy status to other drugs, medicaments and biological substances status: Secondary | ICD-10-CM | POA: Diagnosis not present

## 2020-04-07 DIAGNOSIS — G894 Chronic pain syndrome: Secondary | ICD-10-CM | POA: Diagnosis not present

## 2020-04-07 DIAGNOSIS — I10 Essential (primary) hypertension: Secondary | ICD-10-CM | POA: Diagnosis not present

## 2020-04-07 DIAGNOSIS — W19XXXA Unspecified fall, initial encounter: Secondary | ICD-10-CM | POA: Diagnosis not present

## 2020-04-07 LAB — SARS CORONAVIRUS 2 (TAT 6-24 HRS): SARS Coronavirus 2: NEGATIVE

## 2020-04-07 NOTE — Telephone Encounter (Signed)
The patient called to let us know that she is having surgery on her wrist.

## 2020-04-07 NOTE — H&P (Signed)
Kari Gibbs is an 69 y.o. female.   CC / Reason for Visit: Right wrist injury HPI: This patient is a 69 year old RHD female who presents for evaluation of a right wrist injury that occurred when she fell at home.  She was initially evaluated by Dr. Rip Harbour on 03-22-20 , where x-rays were obtained and she was placed into a forearm-based thumb spica splint.  Over the course of 2 weeks of monitoring with x-rays, intra-articular displacement has worsened, prompting concern for additional evaluation.  Past Medical History:  Diagnosis Date  . Abdominal pain   . Anxiety   . Arthritis    OA LOWER SPINE  . Chronic pain syndrome   . Depression    MOSTLY IN THE FALL OF THE YEAR  . 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 Fatigue   . Gastritis    Distal gastritis  . GERD (gastroesophageal reflux disease)    "told that she does"  . Headache(784.0)    HX OF MIGRAINES  . Hiatal hernia 09/2009  . History of kidney stones   . History of rib fracture (Left 4th and 8th rib) 06/28/2015  . Hypertension   . IBS (irritable bowel syndrome)   . Nausea & vomiting   . OSTEOARTHRITIS 05/01/2010   Qualifier: Diagnosis of  By: Megan Salon MD, John    . Pneumonia    WHEN PT IN 6 TH GRADE  . SBO (small bowel obstruction) ([place]) 2013  . Varicose veins     Past Surgical History:  Procedure Laterality Date  . ABDOMINAL HYSTERECTOMY  1988   partial  . CHOLECYSTECTOMY N/A 05/18/2013   Procedure: LAPAROSCOPIC CHOLECYSTECTOMY WITH INTRAOPERATIVE CHOLANGIOGRAM/laparoscopy/ ;  Surgeon: Pedro Earls, MD;  Location: [facility];  Service: General;  Laterality: N/A;  . ENDOVENOUS ABLATION SAPHENOUS VEIN W/ LASER Left 02/04/2018   endovenous laser ablation left greater saphenous vein by Tinnie Gens MD   . ESOPHAGEAL MANOMETRY N/A 04/05/2019   Procedure: ESOPHAGEAL MANOMETRY (EM);  Surgeon: Wilford Corner, MD;  Location: [facility];  Service: Endoscopy;  Laterality: N/A;  . EXPLORATORY  LAPAROTOMY  2012  . GASTRIC BYPASS  11/13/2009  . HERNIA REPAIR  2011   hiatal  . 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 SHOULDER SURGERY Right 06/16/2017   torn rotator cuff and rebuilding of another section, removed some spurs  . TMJ- LEFT--ABOUT 10 YRS AGO--LEFT SIDE--PT NOW HAS SOME PAIN LEFT JAW Left    still has pain, scar tissue has been removed  . VIDEO BRONCHOSCOPY Bilateral 07/14/2019   Procedure: VIDEO BRONCHOSCOPY WITHOUT FLUORO;  Surgeon: Collene Gobble, MD;  Location: [facility];  Service: Cardiopulmonary;  Laterality: Bilateral;    Family History  Problem Relation Age of Onset  . Hypertension Mother   . Alzheimer's disease Father    Social History:  reports that she has never smoked. She has never used smokeless tobacco. She reports previous alcohol use. She reports that she does not use drugs.  Allergies:  Allergies  Allergen Reactions  . Iohexol Anaphylaxis, Hives, Shortness Of Breath and Swelling    (IOHEXOL = Omnipaque) (Radiological Contrast) Code: HIVES, Desc: Pt has had a prior IVP dye reaction,(no scans in health system).Symptoms were hives,and airway obstruction!, Onset Date: 09/29/1999   . Ace Inhibitors     REACTION: cyclic cough  .  Losartan Potassium Other (See Comments)    Raises blood pressure; fatigue  . Neosporin [Neomycin-Bacitracin Zn-Polymyx] Dermatitis    No medications prior to admission.    No results found for this or any previous visit (from the past 48 hour(s)). No results found.  Review of Systems  All other systems reviewed and are negative.   Height 5' 5.5" (1.664 m), weight 95.3 kg. Physical Exam  Constitutional:  WD, WN, NAD HEENT:  NCAT, EOMI Neuro/Psych:  Alert & oriented to person, place, and time; appropriate mood & affect Lymphatic: No generalized UE edema or  lymphadenopathy Extremities / MSK:  Both UE are normal with respect to appearance, ranges of motion, joint stability, muscle strength/tone, sensation, & perfusion except as otherwise noted:  The right wrist is minimally swollen, neurovascularly intact with pretty good digital motion.  Tenderness at the distal radius dorsal margin  Labs / Xrays:  No radiographic studies obtained today.  X-rays from 07-06-20 are reviewed, revealing an intra-articular fracture of the distal radius, with what appears to be mainly intact volar cortex, but progressive displacement of the dorsal articular surface and cortical shell, resulting in progressive dorsal translation of the carpus upon the radius axis and widening/deepening of the articular surface of the radius  Assessment: Right intra-articular distal radius fracture with progressive displacement  Plan:  I discussed these findings in some degree of detail with her husband.  We radiographs and used plastic models, etc.  We discussed the pros and cons of continued nonoperative care, likely with repeat x-rays next week, may be on Tuesday, versus proceeding with operative treatment based upon the present degree of displacement.  After careful consideration deliberation, they together indicated that they would prefer surgery so this will be planned for Monday, which will be just 3 weeks postop.  We discussed the plan that includes probable dorsal plating.  The details of the operative procedure were discussed with the patient.  Questions were invited and answered.  In addition to the goal of the procedure, the risks of the procedure to include but not limited to bleeding; infection; damage to the nerves or blood vessels that could result in bleeding, numbness, weakness, chronic pain, and the need for additional procedures; stiffness; the need for revision surgery; and anesthetic risks were reviewed.  No specific outcome was guaranteed or implied.  Informed consent was  obtained.  [name], MD 04/07/2020, 10:04 AM

## 2020-04-09 NOTE — Anesthesia Preprocedure Evaluation (Addendum)
Anesthesia Evaluation  Patient identified by MRN, date of birth, ID band Patient awake    Reviewed: Allergy & Precautions, NPO status , Patient's Chart, lab work & pertinent test results  Airway Mallampati: II  TM Distance: >3 FB Neck ROM: Full    Dental no notable dental hx. (+) Teeth Intact, Dental Advisory Given   Pulmonary neg pulmonary ROS,    Pulmonary exam normal breath sounds clear to auscultation       Cardiovascular Exercise Tolerance: Good hypertension, Normal cardiovascular exam Rhythm:Regular Rate:Normal     Neuro/Psych  Headaches, PSYCHIATRIC DISORDERS Anxiety Depression    GI/Hepatic Neg liver ROS, GERD  ,  Endo/Other    Renal/GU negative Renal ROS     Musculoskeletal  (+) Arthritis ,   Abdominal (+) + obese,   Peds  Hematology   Anesthesia Other Findings   Reproductive/Obstetrics                            Anesthesia Physical Anesthesia Plan  ASA: III  Anesthesia Plan: MAC   Post-op Pain Management:    Induction:   PONV Risk Score and Plan: Treatment may vary due to age or medical condition and Midazolam  Airway Management Planned: Natural Airway  Additional Equipment: None  Intra-op Plan:   Post-operative Plan: Extubation in OR  Informed Consent: I have reviewed the patients History and Physical, chart, labs and discussed the procedure including the risks, benefits and alternatives for the proposed anesthesia with the patient or authorized representative who has indicated his/her understanding and acceptance.     Dental advisory given  Plan Discussed with: CRNA  Anesthesia Plan Comments: (MAC w L Supraclavicular block)       Anesthesia Quick Evaluation

## 2020-04-10 ENCOUNTER — Ambulatory Visit (HOSPITAL_BASED_OUTPATIENT_CLINIC_OR_DEPARTMENT_OTHER)
Admission: RE | Admit: 2020-04-10 | Discharge: 2020-04-10 | Disposition: A | Discharge status: Home / Self Care | Payer: Medicare Other | Attending: Orthopedic Surgery | Admitting: Orthopedic Surgery

## 2020-04-10 ENCOUNTER — Other Ambulatory Visit: Payer: Self-pay

## 2020-04-10 ENCOUNTER — Encounter (HOSPITAL_BASED_OUTPATIENT_CLINIC_OR_DEPARTMENT_OTHER): Payer: Self-pay | Admitting: Orthopedic Surgery

## 2020-04-10 ENCOUNTER — Encounter (HOSPITAL_BASED_OUTPATIENT_CLINIC_OR_DEPARTMENT_OTHER)
Admission: RE | Disposition: A | Discharge status: Home / Self Care | Payer: Self-pay | Source: Home / Self Care | Attending: Orthopedic Surgery

## 2020-04-10 ENCOUNTER — Ambulatory Visit (HOSPITAL_BASED_OUTPATIENT_CLINIC_OR_DEPARTMENT_OTHER): Payer: Medicare Other | Admitting: Anesthesiology

## 2020-04-10 ENCOUNTER — Ambulatory Visit (HOSPITAL_COMMUNITY): Payer: Medicare Other

## 2020-04-10 DIAGNOSIS — I10 Essential (primary) hypertension: Secondary | ICD-10-CM | POA: Insufficient documentation

## 2020-04-10 DIAGNOSIS — S52571A Other intraarticular fracture of lower end of right radius, initial encounter for closed fracture: Secondary | ICD-10-CM | POA: Diagnosis not present

## 2020-04-10 DIAGNOSIS — M479 Spondylosis, unspecified: Secondary | ICD-10-CM | POA: Diagnosis not present

## 2020-04-10 DIAGNOSIS — G894 Chronic pain syndrome: Secondary | ICD-10-CM | POA: Insufficient documentation

## 2020-04-10 DIAGNOSIS — W19XXXA Unspecified fall, initial encounter: Secondary | ICD-10-CM | POA: Insufficient documentation

## 2020-04-10 DIAGNOSIS — Z881 Allergy status to other antibiotic agents status: Secondary | ICD-10-CM | POA: Insufficient documentation

## 2020-04-10 DIAGNOSIS — Z419 Encounter for procedure for purposes other than remedying health state, unspecified: Secondary | ICD-10-CM

## 2020-04-10 DIAGNOSIS — Z888 Allergy status to other drugs, medicaments and biological substances status: Secondary | ICD-10-CM | POA: Insufficient documentation

## 2020-04-10 HISTORY — PX: OPEN REDUCTION INTERNAL FIXATION (ORIF) DISTAL RADIAL FRACTURE: SHX5989

## 2020-04-10 SURGERY — OPEN REDUCTION INTERNAL FIXATION (ORIF) DISTAL RADIUS FRACTURE
Anesthesia: Monitor Anesthesia Care | Site: Wrist | Laterality: Right

## 2020-04-10 MED ORDER — PROPOFOL 10 MG/ML IV BOLUS
INTRAVENOUS | Status: AC
  Filled 2020-04-10: qty 20

## 2020-04-10 MED ORDER — PROPOFOL 500 MG/50ML IV EMUL
INTRAVENOUS | Status: DC | PRN
  Administered 2020-04-10: 50 ug/kg/min via INTRAVENOUS

## 2020-04-10 MED ORDER — ACETAMINOPHEN 325 MG PO TABS: 650.0000 mg | ORAL_TABLET | Freq: Four times a day (QID) | ORAL | Status: DC

## 2020-04-10 MED ORDER — FENTANYL CITRATE (PF) 100 MCG/2ML IJ SOLN
100.0000 ug | Freq: Once | INTRAMUSCULAR | Status: AC
  Administered 2020-04-10: 50 ug via INTRAVENOUS

## 2020-04-10 MED ORDER — CEFAZOLIN SODIUM-DEXTROSE 2-4 GM/100ML-% IV SOLN
2.0000 g | INTRAVENOUS | Status: AC
  Administered 2020-04-10: 2 g via INTRAVENOUS

## 2020-04-10 MED ORDER — ROPIVACAINE HCL 7.5 MG/ML IJ SOLN
INTRAMUSCULAR | Status: DC | PRN
  Administered 2020-04-10: 20 mL via PERINEURAL

## 2020-04-10 MED ORDER — LACTATED RINGERS IV SOLN: INTRAVENOUS | Status: DC

## 2020-04-10 MED ORDER — FENTANYL CITRATE (PF) 100 MCG/2ML IJ SOLN: 25.0000 ug | INTRAMUSCULAR | Status: DC | PRN

## 2020-04-10 MED ORDER — IBUPROFEN 200 MG PO TABS: 600.0000 mg | ORAL_TABLET | Freq: Four times a day (QID) | ORAL | Status: DC

## 2020-04-10 MED ORDER — CLONIDINE HCL (ANALGESIA) 100 MCG/ML EP SOLN
EPIDURAL | Status: DC | PRN
  Administered 2020-04-10: 100 ug

## 2020-04-10 MED ORDER — ACETAMINOPHEN 10 MG/ML IV SOLN: 1000.0000 mg | Freq: Once | INTRAVENOUS | Status: DC | PRN

## 2020-04-10 MED ORDER — ONDANSETRON HCL 4 MG/2ML IJ SOLN: 4.0000 mg | Freq: Once | INTRAMUSCULAR | Status: DC | PRN

## 2020-04-10 MED ORDER — PROPOFOL 10 MG/ML IV BOLUS
INTRAVENOUS | Status: DC | PRN
  Administered 2020-04-10: 20 mg via INTRAVENOUS

## 2020-04-10 MED ORDER — CEFAZOLIN SODIUM-DEXTROSE 2-4 GM/100ML-% IV SOLN
INTRAVENOUS | Status: AC
  Filled 2020-04-10: qty 100

## 2020-04-10 MED ORDER — DEXMEDETOMIDINE (PRECEDEX) IN NS 20 MCG/5ML (4 MCG/ML) IV SYRINGE
PREFILLED_SYRINGE | INTRAVENOUS | Status: AC
  Filled 2020-04-10: qty 5

## 2020-04-10 MED ORDER — MIDAZOLAM HCL 2 MG/2ML IJ SOLN
INTRAMUSCULAR | Status: AC
  Filled 2020-04-10: qty 2

## 2020-04-10 MED ORDER — DEXMEDETOMIDINE (PRECEDEX) IN NS 20 MCG/5ML (4 MCG/ML) IV SYRINGE
PREFILLED_SYRINGE | INTRAVENOUS | Status: DC | PRN
  Administered 2020-04-10: 4 ug via INTRAVENOUS

## 2020-04-10 MED ORDER — MIDAZOLAM HCL 2 MG/2ML IJ SOLN
2.0000 mg | Freq: Once | INTRAMUSCULAR | Status: AC
  Administered 2020-04-10: 1 mg via INTRAVENOUS

## 2020-04-10 MED ORDER — FENTANYL CITRATE (PF) 100 MCG/2ML IJ SOLN
INTRAMUSCULAR | Status: AC
  Filled 2020-04-10: qty 2

## 2020-04-10 SURGICAL SUPPLY — 79 items
APL PRP STRL LF DISP 70% ISPRP (MISCELLANEOUS) ×1
BAND INSRT 18 STRL LF DISP RB (MISCELLANEOUS)
BAND RUBBER #18 3X1/16 STRL (MISCELLANEOUS) IMPLANT
BIT DRILL 2.2 SS TIBIAL (BIT) ×1 IMPLANT
BLADE HEX COATED 2.75 (ELECTRODE) ×2 IMPLANT
BLADE MINI RND TIP GREEN BEAV (BLADE) IMPLANT
BLADE SURG 15 STRL LF DISP TIS (BLADE) ×1 IMPLANT
BLADE SURG 15 STRL SS (BLADE) ×2
BNDG CMPR 9X4 STRL LF SNTH (GAUZE/BANDAGES/DRESSINGS) ×1
BNDG COHESIVE 2X5 TAN STRL LF (GAUZE/BANDAGES/DRESSINGS) IMPLANT
BNDG COHESIVE 4X5 TAN STRL (GAUZE/BANDAGES/DRESSINGS) ×2 IMPLANT
BNDG ESMARK 4X9 LF (GAUZE/BANDAGES/DRESSINGS) ×2 IMPLANT
BNDG GAUZE ELAST 4 BULKY (GAUZE/BANDAGES/DRESSINGS) ×2 IMPLANT
BRUSH SCRUB EZ PLAIN DRY (MISCELLANEOUS) IMPLANT
CANISTER SUCT 1200ML W/VALVE (MISCELLANEOUS) ×2 IMPLANT
CHLORAPREP W/TINT 26 (MISCELLANEOUS) ×2 IMPLANT
CORD BIPOLAR FORCEPS 12FT (ELECTRODE) ×2 IMPLANT
COVER BACK TABLE 60X90IN (DRAPES) ×2 IMPLANT
COVER MAYO STAND STRL (DRAPES) ×2 IMPLANT
COVER WAND RF STERILE (DRAPES) IMPLANT
CUFF TOURN SGL QUICK 18X4 (TOURNIQUET CUFF) ×1 IMPLANT
CUFF TOURN SGL QUICK 24 (TOURNIQUET CUFF)
CUFF TRNQT CYL 24X4X16.5-23 (TOURNIQUET CUFF) IMPLANT
DRAPE C-ARM 42X72 X-RAY (DRAPES) ×2 IMPLANT
DRAPE EXTREMITY T 121X128X90 (DISPOSABLE) ×2 IMPLANT
DRAPE SURG 17X23 STRL (DRAPES) ×2 IMPLANT
DRSG ADAPTIC 3X8 NADH LF (GAUZE/BANDAGES/DRESSINGS) ×2 IMPLANT
DRSG EMULSION OIL 3X3 NADH (GAUZE/BANDAGES/DRESSINGS) IMPLANT
ELECT REM PT RETURN 9FT ADLT (ELECTROSURGICAL) ×2
ELECTRODE REM PT RTRN 9FT ADLT (ELECTROSURGICAL) ×1 IMPLANT
GAUZE SPONGE 4X4 12PLY STRL LF (GAUZE/BANDAGES/DRESSINGS) ×2 IMPLANT
GLOVE BIO SURGEON STRL SZ7.5 (GLOVE) ×2 IMPLANT
GLOVE BIOGEL PI IND STRL 7.0 (GLOVE) ×1 IMPLANT
GLOVE BIOGEL PI IND STRL 8 (GLOVE) ×1 IMPLANT
GLOVE BIOGEL PI INDICATOR 7.0 (GLOVE) ×2
GLOVE BIOGEL PI INDICATOR 8 (GLOVE) ×1
GLOVE ECLIPSE 6.5 STRL STRAW (GLOVE) ×2 IMPLANT
GLOVE SURG SS PI 7.0 STRL IVOR (GLOVE) ×1 IMPLANT
GOWN STRL REUS W/ TWL LRG LVL3 (GOWN DISPOSABLE) ×2 IMPLANT
GOWN STRL REUS W/TWL LRG LVL3 (GOWN DISPOSABLE) ×4
GOWN STRL REUS W/TWL XL LVL3 (GOWN DISPOSABLE) ×2 IMPLANT
K-WIRE 1.6 (WIRE) ×4
K-WIRE FX5X1.6XNS BN SS (WIRE) ×2
KWIRE FX5X1.6XNS BN SS (WIRE) IMPLANT
NDL HYPO 25X1 1.5 SAFETY (NEEDLE) IMPLANT
NEEDLE HYPO 25X1 1.5 SAFETY (NEEDLE) IMPLANT
NS IRRIG 1000ML POUR BTL (IV SOLUTION) ×2 IMPLANT
PACK BASIN DAY SURGERY FS (CUSTOM PROCEDURE TRAY) ×2 IMPLANT
PADDING CAST ABS 4INX4YD NS (CAST SUPPLIES) ×1
PADDING CAST ABS COTTON 4X4 ST (CAST SUPPLIES) IMPLANT
PENCIL SMOKE EVACUATOR (MISCELLANEOUS) ×2 IMPLANT
PLATE RT DORSAL (Plate) ×1 IMPLANT
SCREW  LP NL 2.7X16MM (Screw) ×2 IMPLANT
SCREW LOCK 12X2.7X 3 LD (Screw) IMPLANT
SCREW LOCK 14X2.7X 3 LD TPR (Screw) IMPLANT
SCREW LOCK 18X2.7X 3 LD TPR (Screw) IMPLANT
SCREW LOCK 22X2.7X 3 LD TPR (Screw) IMPLANT
SCREW LOCKING 2.7X12MM (Screw) ×2 IMPLANT
SCREW LOCKING 2.7X14 (Screw) ×2 IMPLANT
SCREW LOCKING 2.7X18 (Screw) ×2 IMPLANT
SCREW LOCKING 2.7X22MM (Screw) ×6 IMPLANT
SCREW LP NL 2.7X16MM (Screw) IMPLANT
SLEEVE SCD COMPRESS KNEE MED (MISCELLANEOUS) ×2 IMPLANT
SLING ARM FOAM STRAP LRG (SOFTGOODS) ×1 IMPLANT
SPLINT PLASTER CAST XFAST 3X15 (CAST SUPPLIES) IMPLANT
SPLINT PLASTER XTRA FASTSET 3X (CAST SUPPLIES)
STOCKINETTE 6  STRL (DRAPES) ×2
STOCKINETTE 6 STRL (DRAPES) ×1 IMPLANT
SUCTION FRAZIER HANDLE 10FR (MISCELLANEOUS) ×2
SUCTION TUBE FRAZIER 10FR DISP (MISCELLANEOUS) ×1 IMPLANT
SUT VIC AB 2-0 PS2 27 (SUTURE) ×2 IMPLANT
SUT VICRYL 4-0 PS2 18IN ABS (SUTURE) IMPLANT
SUT VICRYL RAPIDE 4-0 (SUTURE) IMPLANT
SUT VICRYL RAPIDE 4/0 PS 2 (SUTURE) ×2 IMPLANT
SYR 10ML LL (SYRINGE) IMPLANT
SYR BULB EAR ULCER 3OZ GRN STR (SYRINGE) ×2 IMPLANT
TOWEL GREEN STERILE FF (TOWEL DISPOSABLE) ×2 IMPLANT
TUBE CONNECTING 20X1/4 (TUBING) ×2 IMPLANT
UNDERPAD 30X36 HEAVY ABSORB (UNDERPADS AND DIAPERS) ×2 IMPLANT

## 2020-04-10 NOTE — Progress Notes (Signed)
Assisted Dr. Houser with right, ultrasound guided, supraclavicular block. Side rails up, monitors on throughout procedure. See vital signs in flow sheet. Tolerated Procedure well. 

## 2020-04-10 NOTE — Transfer of Care (Signed)
Immediate Anesthesia Transfer of Care Note  Patient: Kari Gibbs  Procedure(s) Performed: OPEN TREATMENT OF LEFT DISTAL RADIUS FRACTURE (Right Wrist)  Patient Location: PACU  Anesthesia Type:MAC and Regional  Level of Consciousness: awake, alert  and oriented  Airway & Oxygen Therapy: Patient Spontanous Breathing and Patient connected to face mask oxygen  Post-op Assessment: Report given to RN and Post -op Vital signs reviewed and stable  Post vital signs: Reviewed and stable  Last Vitals:  Vitals Value Taken Time  BP    Temp    Pulse 69 04/10/20 1032  Resp    SpO2 96 % 04/10/20 1032  Vitals shown include unvalidated device data.  Last Pain:  Vitals:   04/10/20 0825  TempSrc: Oral  PainSc: 0-No pain         Complications: No complications documented.

## 2020-04-10 NOTE — Discharge Instructions (Signed)
Discharge Instructions   You have a dressing with a plaster splint incorporated in it. Move your fingers as much as possible, making a full fist and fully opening the fist. Elevate your hand to reduce pain & swelling of the digits.  Ice over the operative site may be helpful to reduce pain & swelling.  DO NOT USE HEAT. Take Ibuprofen 600 mg and Tylenol 650 mg every 6 hours together. Continue your baseline medicine as prescribed by your pain management physician. Leave the dressing in place until you return to our office.  You may shower, but keep the bandage clean & dry.  You may drive a car when you are off of prescription pain medications and can safely control your vehicle with both hands. Our office will call you to arrange follow-up   Please call [phone] during normal business hours or [phone] after hours for any problems. Including the following:  - excessive redness of the incisions - drainage for more than 4 days - fever of more than 101.5 F  *Please note that pain medications will not be refilled after hours or on weekends.   Post Anesthesia Home Care Instructions  Activity: Get plenty of rest for the remainder of the day. A responsible individual must stay with you for 24 hours following the procedure.  For the next 24 hours, DO NOT: -Drive a car -Paediatric nurse -Drink alcoholic beverages -Take any medication unless instructed by your physician -Make any legal decisions or sign important papers.  Meals: Start with liquid foods such as gelatin or soup. Progress to regular foods as tolerated. Avoid greasy, spicy, heavy foods. If nausea and/or vomiting occur, drink only clear liquids until the nausea and/or vomiting subsides. Call your physician if vomiting continues.  Special Instructions/Symptoms: Your throat may feel dry or sore from the anesthesia or the breathing tube placed in your throat during surgery. If this causes discomfort, gargle with warm salt  water. The discomfort should disappear within 24 hours.  If you had a scopolamine patch placed behind your ear for the management of post- operative nausea and/or vomiting:  1. The medication in the patch is effective for 72 hours, after which it should be removed.  Wrap patch in a tissue and discard in the trash. Wash hands thoroughly with soap and water. 2. You may remove the patch earlier than 72 hours if you experience unpleasant side effects which may include dry mouth, dizziness or visual disturbances. 3. Avoid touching the patch. Wash your hands with soap and water after contact with the patch.    Regional Anesthesia Blocks  1. Numbness or the inability to move the "blocked" extremity may last from 3-48 hours after placement. The length of time depends on the medication injected and your individual response to the medication. If the numbness is not going away after 48 hours, call your surgeon.  2. The extremity that is blocked will need to be protected until the numbness is gone and the  Strength has returned. Because you cannot feel it, you will need to take extra care to avoid injury. Because it may be weak, you may have difficulty moving it or using it. You may not know what position it is in without looking at it while the block is in effect.  3. For blocks in the legs and feet, returning to weight bearing and walking needs to be done carefully. You will need to wait until the numbness is entirely gone and the strength has returned. You should be  able to move your leg and foot normally before you try and bear weight or walk. You will need someone to be with you when you first try to ensure you do not fall and possibly risk injury.  4. Bruising and tenderness at the needle site are common side effects and will resolve in a few days.  5. Persistent numbness or new problems with movement should be communicated to the surgeon or the [facility] [phone] [place] [phone]).

## 2020-04-10 NOTE — Anesthesia Postprocedure Evaluation (Signed)
Anesthesia Post Note  Patient: Kari Gibbs  Procedure(s) Performed: OPEN TREATMENT OF LEFT DISTAL RADIUS FRACTURE (Right Wrist)     Patient location during evaluation: PACU Anesthesia Type: MAC and Regional Level of consciousness: awake and alert Pain management: pain level controlled Vital Signs Assessment: post-procedure vital signs reviewed and stable Respiratory status: spontaneous breathing, nonlabored ventilation, respiratory function stable and patient connected to nasal cannula oxygen Cardiovascular status: stable and blood pressure returned to baseline Postop Assessment: no apparent nausea or vomiting Anesthetic complications: no   No complications documented.  Last Vitals:  Vitals:   04/10/20 1100 04/10/20 1128  BP: (!) 104/55 119/62  Pulse: 60 66  Resp: 14 14  Temp:  36.5 C  SpO2: 96% 96%    Last Pain:  Vitals:   04/10/20 1128  TempSrc:   PainSc: 0-No pain                 Barnet Glasgow

## 2020-04-10 NOTE — Interval H&P Note (Signed)
History and Physical Interval Note:  04/10/2020 9:08 AM  Kari Gibbs  has presented today for surgery, with the diagnosis of RIGHT DISTAL RADIUS FRACTURE.  The various methods of treatment have been discussed with the patient and family. After consideration of risks, benefits and other options for treatment, the patient has consented to Open treatment of right distal radius fracture as a surgical intervention.  The patient's history has been reviewed, patient examined, no change in status, stable for surgery.  I have reviewed the patient's chart and labs.  Questions were answered to the patient's satisfaction.     [name]

## 2020-04-10 NOTE — Anesthesia Procedure Notes (Signed)
Date/Time: 04/10/2020 8:28 AM Performed by: Glory Buff, CRNA Oxygen Delivery Method: Simple face mask

## 2020-04-10 NOTE — Op Note (Signed)
04/10/2020  9:09 AM  PATIENT:  Kari Gibbs  69 y.o. female  PRE-OPERATIVE DIAGNOSIS: Displaced right intra-articular distal radius fracture (3+ fragments)  POST-OPERATIVE DIAGNOSIS:  Same  PROCEDURE:  1.  Open treatment right intra-articular distal radius fracture, 79150    2.  Right wrist PIN neurectomy, [phone]  SURGEON: Rayvon Char. Grandville Silos, MD  PHYSICIAN ASSISTANT: Morley Kos, OPA-C  ANESTHESIA:  regional and MAC  SPECIMENS:  None  DRAINS: None  EBL:  less than 50 mL  PREOPERATIVE INDICATIONS:  Kari Gibbs is a  69 y.o. female with displaced intra-articular right distal radius fracture that over a couple weeks time has shown progressive dorsal collapse and resultant dorsal subluxation at the radiocarpal joint  The risks benefits and alternatives were discussed with the patient preoperatively including but not limited to the risks of infection, bleeding, nerve injury, cardiopulmonary complications, the need for revision surgery, among others, and the patient verbalized understanding and consented to proceed.  OPERATIVE IMPLANTS: Biomet DVR dorsal ulnar fragment-specific plate/screws  OPERATIVE PROCEDURE: After receiving prophylactic antibiotics and a regional block, the patient was escorted to the operative theatre and placed in a supine position.   A surgical "time-out" was performed during which the planned procedure, proposed operative site, and the correct patient identity were compared to the operative consent and agreement confirmed by the circulating nurse according to current facility policy. Following application of a tourniquet to the operative extremity, the exposed skin was pre-scrubbed with Hibiclens scrub brush and then was prepped with Chloraprep and draped in the usual sterile fashion. The limb was exsanguinated with an Esmarch bandage and the tourniquet inflated to approximately 175mHg higher than systolic BP.   A 3 limb zigzag incision was marked and  made over the central longitudinal axis of the radius, over the distal aspect of the radius.  Full-thickness flaps were elevated.  The third compartment was entered and the EPL transposed.  The fourth compartment was elevated and this allowed access to the PIN, after which PIN neurectomy was performed with bipolar electrocautery, leaving the transected and cauterized more proximal than the plate would be, near the muscle bellies of the fourth compartment.  The periosteum was elevated off of the distal radius.  Lister's itself was actually not very well developed.  Through some progressive manipulation, the fracture was reduced.  A dorsal ulnar plate was fashioned and secured, adjusting his position fluoroscopically.  It was held with a K wire provisionally and then a compressing screw placed in the slotted hole.  This was deemed an appropriate placement, and the distal holes were filled with locking screws.  The remainder of the proximal holes were drilled and filled in standard fashion and final images were obtained revealing a near anatomically aligned distal radius fracture with a dorsal plate securing it.  The DRUJ was stable to examination.  The wound was irrigated and the tourniquet released.  Additional hemostasis was obtained with bipolar electrocautery and the EPL was left transposed as it seemed most appropriate given the soft tissue constraints.  The fourth compartment was replaced in its retinaculum secured.  This was done with 2-0 Vicryl interrupted suture.  The skin was closed with 4-0 Vicryl Rapide deep dermal buried sutures followed by running 4-0 Vicryl Rapide horizontal mattress suture in the skin. A bulky dressing with a volar plaster component was applied and the patient was taken to the recovery room in stable condition.  DISPOSITION: The patient will be discharged home today with typical post-op instructions, returning in  10-15 days for reevaluation with new x-rays of the affected wrist out  of the splint to include an inclined lateral and then transition to therapy to have a custom splint constructed and begin rehabilitation.

## 2020-04-10 NOTE — Anesthesia Procedure Notes (Signed)
Anesthesia Regional Block: Supraclavicular block   Pre-Anesthetic Checklist: ,, timeout performed, Correct Patient, Correct Site, Correct Laterality, Correct Procedure, Correct Position, site marked, Risks and benefits discussed,  Surgical consent,  Pre-op evaluation,  At surgeon's request and post-op pain management  Laterality: Right  Prep: chloraprep       Needles:  Injection technique: Single-shot  Needle Type: Echogenic Needle     Needle Length: 5cm  Needle Gauge: 21     Additional Needles:   Procedures:,,,, ultrasound used (permanent image in chart),,,,  Narrative:  Start time: 04/10/2020 8:47 AM End time: 04/10/2020 8:55 AM Injection made incrementally with aspirations every 5 mL.  Performed by: Personally  Anesthesiologist: Barnet Glasgow, MD

## 2020-04-11 ENCOUNTER — Encounter (HOSPITAL_BASED_OUTPATIENT_CLINIC_OR_DEPARTMENT_OTHER): Payer: Self-pay | Admitting: Orthopedic Surgery

## 2020-05-27 NOTE — Patient Instructions (Addendum)
____________________________________________________________________________________________  Medication Rules  Purpose: To inform patients, and their family members, of our rules and regulations.  Applies to: All patients receiving prescriptions (written or electronic).  Pharmacy of record: Pharmacy where electronic prescriptions will be sent. If written prescriptions are taken to a different pharmacy, please inform the nursing staff. The pharmacy listed in the electronic medical record should be the one where you would like electronic prescriptions to be sent.  Electronic prescriptions: In compliance with the [place] Strengthen Opioid Misuse Prevention (STOP) Act of 2017 (Session Law 2017-74/H243), effective August 12, 2018, all controlled substances must be electronically prescribed. Calling prescriptions to the pharmacy will cease to exist.  Prescription refills: Only during scheduled appointments. Applies to all prescriptions.  NOTE: The following applies primarily to controlled substances (Opioid* Pain Medications).   Type of encounter (visit): For patients receiving controlled substances, face-to-face visits are required. (Not an option or up to the patient.)  Patient's responsibilities: 1. Pain Pills: Bring all pain pills to every appointment (except for procedure appointments). 2. Pill Bottles: Bring pills in original pharmacy bottle. Always bring the newest bottle. Bring bottle, even if empty. 3. Medication refills: You are responsible for knowing and keeping track of what medications you take and those you need refilled. The day before your appointment: write a list of all prescriptions that need to be refilled. The day of the appointment: give the list to the admitting nurse. Prescriptions will be written only during appointments. No prescriptions will be written on procedure days. If you forget a medication: it will not be "Called in", "Faxed", or "electronically sent".  You will need to get another appointment to get these prescribed. No early refills. Do not call asking to have your prescription filled early. 4. Prescription Accuracy: You are responsible for carefully inspecting your prescriptions before leaving our office. Have the discharge nurse carefully go over each prescription with you, before taking them home. Make sure that your name is accurately spelled, that your address is correct. Check the name and dose of your medication to make sure it is accurate. Check the number of pills, and the written instructions to make sure they are clear and accurate. Make sure that you are given enough medication to last until your next medication refill appointment. 5. Taking Medication: Take medication as prescribed. When it comes to controlled substances, taking less pills or less frequently than prescribed is permitted and encouraged. Never take more pills than instructed. Never take medication more frequently than prescribed.  6. Inform other Doctors: Always inform, all of your healthcare providers, of all the medications you take. 7. Pain Medication from other Providers: You are not allowed to accept any additional pain medication from any other Doctor or Healthcare provider. There are two exceptions to this rule. (see below) In the event that you require additional pain medication, you are responsible for notifying us, as stated below. 8. Medication Agreement: You are responsible for carefully reading and following our Medication Agreement. This must be signed before receiving any prescriptions from our practice. Safely store a copy of your signed Agreement. Violations to the Agreement will result in no further prescriptions. (Additional copies of our Medication Agreement are available upon request.) 9. Laws, Rules, & Regulations: All patients are expected to follow all Federal and State Laws, Statutes, Rules, & Regulations. Ignorance of the Laws does not constitute a  valid excuse.  10. Illegal drugs and Controlled Substances: The use of illegal substances (including, but not limited to marijuana and its   derivatives) and/or the illegal use of any controlled substances is strictly prohibited. Violation of this rule may result in the immediate and permanent discontinuation of any and all prescriptions being written by our practice. The use of any illegal substances is prohibited. 11. Adopted CDC guidelines & recommendations: Target dosing levels will be at or below 60 MME/day. Use of benzodiazepines** is not recommended.  Exceptions: There are only two exceptions to the rule of not receiving pain medications from other Healthcare Providers. 1. Exception #1 (Emergencies): In the event of an emergency (i.e.: accident requiring emergency care), you are allowed to receive additional pain medication. However, you are responsible for: As soon as you are able, call our office [phone], at any time of the day or night, and leave a message stating your name, the date and nature of the emergency, and the name and dose of the medication prescribed. In the event that your call is answered by a member of our staff, make sure to document and save the date, time, and the name of the person that took your information.  2. Exception #2 (Planned Surgery): In the event that you are scheduled by another doctor or dentist to have any type of surgery or procedure, you are allowed (for a period no longer than 30 days), to receive additional pain medication, for the acute post-op pain. However, in this case, you are responsible for picking up a copy of our "Post-op Pain Management for Surgeons" handout, and giving it to your surgeon or dentist. This document is available at our office, and does not require an appointment to obtain it. Simply go to our office during business hours (Monday-Thursday from 8:00 AM to 4:00 PM) (Friday 8:00 AM to 12:00 Noon) or if you have a scheduled appointment  with us, prior to your surgery, and ask for it by name. In addition, you are responsible for: calling our office [phone], at any time of the day or night, and leaving a message stating your name, name of your surgeon, type of surgery, and date of procedure or surgery. Failure to comply with your responsibilities may result in termination of therapy involving the controlled substances.  *Opioid medications include: morphine, codeine, oxycodone, oxymorphone, hydrocodone, hydromorphone, meperidine, tramadol, tapentadol, buprenorphine, fentanyl, methadone. **Benzodiazepine medications include: diazepam (Valium), alprazolam (Xanax), clonazepam (Klonopine), lorazepam (Ativan), clorazepate (Tranxene), chlordiazepoxide (Librium), estazolam (Prosom), oxazepam (Serax), temazepam (Restoril), triazolam (Halcion) (Last updated: 04/18/2020) ____________________________________________________________________________________________   ____________________________________________________________________________________________  Medication Recommendations and Reminders  Applies to: All patients receiving prescriptions (written and/or electronic).  Medication Rules & Regulations: These rules and regulations exist for your safety and that of others. They are not flexible and neither are we. Dismissing or ignoring them will be considered "non-compliance" with medication therapy, resulting in complete and irreversible termination of such therapy. (See document titled "Medication Rules" for more details.) In all conscience, because of safety reasons, we cannot continue providing a therapy where the patient does not follow instructions.  Pharmacy of record:   Definition: This is the pharmacy where your electronic prescriptions will be sent.   We do not endorse any particular pharmacy, however, we have experienced problems with [facility] not securing enough medication supply for the community.  We do not  restrict you in your choice of pharmacy. However, once we write for your prescriptions, we will NOT be re-sending more prescriptions to fix restricted supply problems created by your pharmacy, or your insurance.   The pharmacy listed in the electronic medical record should be the   one where you want electronic prescriptions to be sent.  If you choose to change pharmacy, simply notify our nursing staff.  Recommendations:  Keep all of your pain medications in a safe place, under lock and key, even if you live alone. We will NOT replace lost, stolen, or damaged medication.  After you fill your prescription, take 1 week's worth of pills and put them away in a safe place. You should keep a separate, properly labeled bottle for this purpose. The remainder should be kept in the original bottle. Use this as your primary supply, until it runs out. Once it's gone, then you know that you have 1 week's worth of medicine, and it is time to come in for a prescription refill. If you do this correctly, it is unlikely that you will ever run out of medicine.  To make sure that the above recommendation works, it is very important that you make sure your medication refill appointments are scheduled at least 1 week before you run out of medicine. To do this in an effective manner, make sure that you do not leave the office without scheduling your next medication management appointment. Always ask the nursing staff to show you in your prescription , when your medication will be running out. Then arrange for the receptionist to get you a return appointment, at least 7 days before you run out of medicine. Do not wait until you have 1 or 2 pills left, to come in. This is very poor planning and does not take into consideration that we may need to cancel appointments due to bad weather, sickness, or emergencies affecting our staff.  DO NOT ACCEPT A "Partial Fill": If for any reason your pharmacy does not have enough pills/tablets  to completely fill or refill your prescription, do not allow for a "partial fill". The law allows the pharmacy to complete that prescription within 72 hours, without requiring a new prescription. If they do not fill the rest of your prescription within those 72 hours, you will need a separate prescription to fill the remaining amount, which we will NOT provide. If the reason for the partial fill is your insurance, you will need to talk to the pharmacist about payment alternatives for the remaining tablets, but again, DO NOT ACCEPT A PARTIAL FILL, unless you can trust your pharmacist to obtain the remainder of the pills within 72 hours.  Prescription refills and/or changes in medication(s):   Prescription refills, and/or changes in dose or medication, will be conducted only during scheduled medication management appointments. (Applies to both, written and electronic prescriptions.)  No refills on procedure days. No medication will be changed or started on procedure days. No changes, adjustments, and/or refills will be conducted on a procedure day. Doing so will interfere with the diagnostic portion of the procedure.  No phone refills. No medications will be "called into the pharmacy".  No Fax refills.  No weekend refills.  No Holliday refills.  No after hours refills.  Remember:  Business hours are:  Monday to Thursday 8:00 AM to 4:00 PM Provider's Schedule: Micheal Sheen, MD - Appointments are:  Medication management: Monday and Wednesday 8:00 AM to 4:00 PM Procedure day: Tuesday and Thursday 7:30 AM to 4:00 PM Bilal Lateef, MD - Appointments are:  Medication management: Tuesday and Thursday 8:00 AM to 4:00 PM Procedure day: Monday and Wednesday 7:30 AM to 4:00 PM (Last update: 03/01/2020) ____________________________________________________________________________________________  ____________________________________________________________________________________________  CBD  (cannabidiol) WARNING  Applicable to: All individuals currently taking   or considering taking CBD (cannabidiol) and, more important, all patients taking opioid analgesic controlled substances (pain medication). (Example: oxycodone; oxymorphone; hydrocodone; hydromorphone; morphine; methadone; tramadol; tapentadol; fentanyl; buprenorphine; butorphanol; dextromethorphan; meperidine; codeine; etc.)  Legal status: CBD remains a Schedule I drug prohibited for any use. CBD is illegal with one exception. In the [place], CBD has a limited Food and Drug Administration (FDA) approval for the treatment of two specific types of epilepsy disorders. Only one CBD product has been approved by the FDA for this purpose: "Epidiolex". FDA is aware that some companies are marketing products containing cannabis and cannabis-derived compounds in ways that violate the Federal Food, Drug and Cosmetic Act (FD&C Act) and that may put the health and safety of consumers at risk. The FDA, a Federal agency, has not enforced the CBD status since 2018.   Legality: Some manufacturers ship CBD products nationally, which is illegal. Often such products are sold online and are therefore available throughout the country. CBD is openly sold in head shops and health food stores in some states where such sales have not been explicitly legalized. Selling unapproved products with unsubstantiated therapeutic claims is not only a violation of the law, but also can put patients at risk, as these products have not been proven to be safe or effective. Federal illegality makes it difficult to conduct research on CBD.  Reference: "FDA Regulation of Cannabis and Cannabis-Derived Products, Including Cannabidiol (CBD)" - https://www.fda.gov/news-events/public-health-focus/fda-regulation-cannabis-and-cannabis-derived-products-including-cannabidiol-cbd  Warning: CBD is not FDA approved and has not undergo the same manufacturing controls as prescription  drugs.  This means that the purity and safety of available CBD may be questionable. Most of the time, despite manufacturer's claims, it is contaminated with THC (delta-9-tetrahydrocannabinol - the chemical in marijuana responsible for the "HIGH").  When this is the case, the THC contaminant will trigger a positive urine drug screen (UDS) test for Marijuana (carboxy-THC). Because a positive UDS for any illicit substance is a violation of our medication agreement, your opioid analgesics (pain medicine) may be permanently discontinued.  MORE ABOUT CBD  General Information: CBD  is a derivative of the Marijuana (cannabis sativa) plant discovered in 1940. It is one of the 113 identified substances found in Marijuana. It accounts for up to 40% of the plant's extract. As of 2018, preliminary clinical studies on CBD included research for the treatment of anxiety, movement disorders, and pain. CBD is available and consumed in multiple forms, including inhalation of smoke or vapor, as an aerosol spray, and by mouth. It may be supplied as an oil containing CBD, capsules, dried cannabis, or as a liquid solution. CBD is thought not to be as psychoactive as THC (delta-9-tetrahydrocannabinol - the chemical in marijuana responsible for the "HIGH"). Studies suggest that CBD may interact with different biological target receptors in the body, including cannabinoid and other neurotransmitter receptors. As of 2018 the mechanism of action for its biological effects has not been determined.  Side-effects  Adverse reactions: Dry mouth, diarrhea, decreased appetite, fatigue, drowsiness, malaise, weakness, sleep disturbances, and others.  Drug interactions: CBC may interact with other medications such as blood-thinners. (Last update:  03/18/2020) ____________________________________________________________________________________________   ____________________________________________________________________________________________  Drug Holidays (Slow)  What is a "Drug Holiday"? Drug Holiday: is the name given to the period of time during which a patient stops taking a medication(s) for the purpose of eliminating tolerance to the drug.  Benefits . Improved effectiveness of opioids. . Decreased opioid dose needed to achieve benefits. . Improved pain with lesser dose.  What   is tolerance? Tolerance: is the progressive decreased in effectiveness of a drug due to its repetitive use. With repetitive use, the body gets use to the medication and as a consequence, it loses its effectiveness. This is a common problem seen with opioid pain medications. As a result, a larger dose of the drug is needed to achieve the same effect that used to be obtained with a smaller dose.  How long should a "Drug Holiday" last? You should stay off of the pain medicine for at least 14 consecutive days. (2 weeks)  Should I stop the medicine "cold turkey"? No. You should always coordinate with your Pain Specialist so that he/she can provide you with the correct medication dose to make the transition as smoothly as possible.  How do I stop the medicine? Slowly. You will be instructed to decrease the daily amount of pills that you take by one (1) pill every seven (7) days. This is called a "slow downward taper" of your dose. For example: if you normally take four (4) pills per day, you will be asked to drop this dose to three (3) pills per day for seven (7) days, then to two (2) pills per day for seven (7) days, then to one (1) per day for seven (7) days, and at the end of those last seven (7) days, this is when the "Drug Holiday" would start.   Will I have withdrawals? By doing a "slow downward taper" like this one, it is unlikely that you will  experience any significant withdrawal symptoms. Typically, what triggers withdrawals is the sudden stop of a high dose opioid therapy. Withdrawals can usually be avoided by slowly decreasing the dose over a prolonged period of time. If you do not follow these instructions and decide to stop your medication abruptly, withdrawals may be possible.  What are withdrawals? Withdrawals: refers to the wide range of symptoms that occur after stopping or dramatically reducing opiate drugs after heavy and prolonged use. Withdrawal symptoms do not occur to patients that use low dose opioids, or those who take the medication sporadically. Contrary to benzodiazepine (example: Valium, Xanax, etc.) or alcohol withdrawals ("Delirium Tremens"), opioid withdrawals are not lethal. Withdrawals are the physical manifestation of the body getting rid of the excess receptors.  Expected Symptoms Early symptoms of withdrawal may include: . Agitation . Anxiety . Muscle aches . Increased tearing . Insomnia . Runny nose . Sweating . Yawning  Late symptoms of withdrawal may include: . Abdominal cramping . Diarrhea . Dilated pupils . Goose bumps . Nausea . Vomiting  Will I experience withdrawals? Due to the slow nature of the taper, it is very unlikely that you will experience any.  What is a slow taper? Taper: refers to the gradual decrease in dose.  (Last update: 03/01/2020) ____________________________________________________________________________________________      

## 2020-05-27 NOTE — Progress Notes (Signed)
PROVIDER NOTE: Information contained herein reflects review and annotations entered in association with encounter. Interpretation of such information and data should be left to medically-trained personnel. Information provided to patient can be located elsewhere in the medical record under "Patient Instructions". Document created using STT-dictation technology, any transcriptional errors that may result from process are unintentional.    Patient: Kari Gibbs  Service Category: E/M  Provider: Gaspar Cola, MD  DOB: 02/03/1951  DOS: 05/29/2020  Specialty: Interventional Pain Management  MRN: [id number]  Setting: Ambulatory outpatient  PCP: Cari Caraway, MD  Type: Established Patient    Referring Provider: Cari Caraway, MD  Location: Office  Delivery: Face-to-face     HPI  [name], a 69 y.o. year old female, is here today because of her Chronic pain syndrome [G89.4]. Ms. Gignac primary complain today is Chest Pain (left intercostal just below left breast), Wrist Pain (right ), and Ankle Pain (right ) Last encounter: My last encounter with her was on 03/08/2020. Pertinent problems: Ms. Dick has Fracture of rib (4th and 8th rib) (Left); Chronic pain syndrome; Chronic flank pain (Primary Area of Pain) (Left); Chronic thoracic radicular pain (Left-sided); Chronic occipital neuralgia (Left); Neuropathic pain; Neurogenic pain; History of rib fracture (4th and 8th rib) (Left); Vertebral body hemangioma (T11); Metatarsalgia of foot (Right); Chronic abdominal pain (Bilateral) (L>R) (possible left-sided thoracic radiculopathy); Osteoarthritis; Chronic neck pain (Bilateral) (L>R); Cervical facet syndrome (Bilateral) (L>R); Muscle spasticity; Tremors of nervous system; Muscle twitching; Cervical spondylosis; Chronic low back pain (Bilateral) w/o sciatica; Acute costochondritis (Left); Chronic low back pain (Bilateral) w/ sciatica (Bilateral); Chronic sacroiliac joint pain (Bilateral);  Other specified dorsopathies, sacral and sacrococcygeal region; and Chronic sacroiliac joint somatic dysfunction (Bilateral) on their pertinent problem list. Pain Assessment: Severity of Chronic pain is reported as a 4 /10. Location: Wrist (see visit info for additional pain sites.) Right/ . Onset: More than a month ago. Quality: Discomfort, Constant, Stabbing, Sore, Throbbing (pinching under left breast). Timing: Constant. Modifying factor(s): elevating feet and puttimg compression socks on right ankle.  brace on right wrist.  exercises for physical therapy. Vitals:  height is 5' 5.5" (1.664 m) and weight is 209 lb (94.8 kg). Her oral temperature is 98.8 F (37.1 C). Her blood pressure is 126/68 and her pulse is 103 (abnormal). Her respiration is 16 and oxygen saturation is 97%.   Reason for encounter: medication management.  The patient indicates doing well with the current medication regimen. No adverse reactions or side effects reported to the medications.  The patient had her Effexor changed for Ativan.  Today I have given the patient appropriate warning regarding the possible drug to drug interaction between opioids and benzodiazepines.  I make sure that she had her Narcan, which she showed to me since she had it in her purse.  She also indicated being interested in doing a  "Drug Holiday" around January since she wants to know if she still has the same type of pain that she had before in the area of the ribs. RTCB: 09/11/2020 Nonopioids transferred (05/29/2020): Gabapentin, Voltaren gel.  Pharmacotherapy Assessment   Analgesic: MS Contin 30 mg, 1 tab PO q 12 hrs (60 mg/day of morphine) MME/day:60 mg/day.   Monitoring: [place] PMP: PDMP reviewed during this encounter.       Pharmacotherapy: No side-effects or adverse reactions reported. Compliance: No problems identified. Effectiveness: Clinically acceptable.  Janett Billow, RN  05/29/2020  1:43 PM  Sign when Signing Visit Nursing Pain  Medication Assessment:  Safety precautions to be maintained throughout the outpatient stay will include: orient to surroundings, keep bed in low position, maintain call bell within reach at all times, provide assistance with transfer out of bed and ambulation.  Medication Inspection Compliance: Pill count conducted under aseptic conditions, in front of the patient. Neither the pills nor the bottle was removed from the patient's sight at any time. Once count was completed pills were immediately returned to the patient in their original bottle.  Medication: Morphine ER (MSContin) Pill/Patch Count: 29 of 60 pills remain Pill/Patch Appearance: Markings consistent with prescribed medication Bottle Appearance: Standard pharmacy container. Clearly labeled. Filled Date: 10 / 03 / 2021 Last Medication intake:  Today    UDS:  Summary  Date Value Ref Range Status  12/09/2019 Note  Final    Comment:    ==================================================================== [employer] Select 13 (MW) ==================================================================== Test                             Result       Flag       Units Drug Present and Declared for Prescription Verification   Morphine                       >7937        EXPECTED   ng/mg creat   Normorphine                    913          EXPECTED   ng/mg creat    Potential sources of large amounts of morphine in the absence of    codeine include administration of morphine or use of heroin.    Normorphine is an expected metabolite of morphine.   Hydromorphone                  309          EXPECTED   ng/mg creat    Hydromorphone may be present as a metabolite of morphine;    concentrations of hydromorphone rarely exceed 5% of the morphine    concentration when this is the source of hydromorphone. ==================================================================== Test                      Result    Flag   Units      Ref Range   Creatinine               126              mg/dL      >=20 ==================================================================== Declared Medications:  The flagging and interpretation on this report are based on the  following declared medications.  Unexpected results may arise from  inaccuracies in the declared medications.  **Note: The testing scope of this panel includes these medications:  Morphine  **Note: The testing scope of this panel does not include the  following reported medications:  Azelastine  Benzonatate  Buspirone  Cholecalciferol  Cranberry  Cyanocobalamin  Dextromethorphan  Esomeprazole (Nexium)  Gabapentin  Irbesartan  Iron  Loratadine  Magnesium  Multivitamin  Naloxone  Nifedipine  Probiotic  Promethazine  Topical Diclofenac  Venlafaxine  Vitamin C ==================================================================== For clinical consultation, please call [phone]. ====================================================================      ROS  Constitutional: Denies any fever or chills Gastrointestinal: No reported hemesis, hematochezia, vomiting, or acute GI distress Musculoskeletal: Denies any acute onset joint  swelling, redness, loss of ROM, or weakness Neurological: No reported episodes of acute onset apraxia, aphasia, dysarthria, agnosia, amnesia, paralysis, loss of coordination, or loss of consciousness  Medication Review  Biotin, Cranberry, Flutter, Iron-Vitamin C, LORazepam, Magnesium, NIFEdipine, Peppermint Oil, Prenatal MV & Min w/FA-DHA, RA Probiotic Gummies, Vitamin D3, acetaminophen, azelastine, benzonatate, busPIRone, conjugated estrogens, diclofenac Sodium, diltiazem, esomeprazole, gabapentin, ibuprofen, irbesartan, loratadine, montelukast, morphine, naloxone, potassium gluconate, promethazine-dextromethorphan, and vitamin B-12  History Review  Allergy: Ms. Mousel is allergic to iohexol, ace inhibitors, losartan potassium, and neosporin  [neomycin-bacitracin zn-polymyx]. Drug: Ms. Marasco  reports no history of drug use. Alcohol:  reports previous alcohol use. Tobacco:  reports that she has never smoked. She has never used smokeless tobacco. Social: Ms. Sen  reports that she has never smoked. She has never used smokeless tobacco. She reports previous alcohol use. She reports that she does not use drugs. 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 Surgical: Ms. 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 Family: family history includes Alzheimer's disease in her father; Hypertension in her mother.  Laboratory Chemistry Profile   Renal Lab Results  Component Value Date   BUN 20 01/04/2016   CREATININE 0.76 01/04/2016   GFRAA >60 01/04/2016   GFRNONAA >60 01/04/2016     Hepatic Lab Results  Component Value Date   AST 33 01/04/2016   ALT 26 01/04/2016   ALBUMIN 4.2 01/04/2016   ALKPHOS 97 01/04/2016   AMYLASE 30 02/27/2010   LIPASE 22 10/12/2012      Electrolytes Lab Results  Component Value Date   NA 139 01/04/2016   K 4.6 01/04/2016   CL 99 (L) 01/04/2016   CALCIUM 9.7 01/04/2016   MG 2.1 01/04/2016     Bone Lab Results  Component Value Date   25OHVITD1 49 01/04/2016   25OHVITD2 25 01/04/2016   25OHVITD3 24 01/04/2016     Inflammation (CRP: Acute Phase) (ESR: Chronic Phase) Lab Results  Component Value Date   CRP <0.5 01/04/2016   ESRSEDRATE 17 01/04/2016       Note: Above Lab results reviewed.  Recent Imaging Review  DG C-Arm 1-60 Min CLINICAL DATA:  ORIF RIGHT wrist  EXAM: RIGHT WRIST - COMPLETE 3+ VIEW; DG C-ARM 1-60 MIN  COMPARISON:  04/05/2020  FLUOROSCOPY TIME:  0 minutes 30 seconds  Dose: 0.84 mGy  Images: 3  FINDINGS: Dorsal plate and multiple screws identified at the distal RIGHT radius post ORIF of previously seen distal radial metaphyseal fracture.  Displacement of fragments and dorsal tilt of distal radial articular surface appear improved since the previous exam.  Diffuse osseous demineralization.  No additional acute osseous findings.  IMPRESSION: Post ORIF distal RIGHT radius.  Electronically Signed   By: Lavonia Dana M.D.   On: 04/10/2020 15:25 DG Wrist Complete Right  CLINICAL DATA:  ORIF RIGHT wrist  EXAM: RIGHT WRIST - COMPLETE 3+ VIEW; DG C-ARM 1-60 MIN  COMPARISON:  04/05/2020  FLUOROSCOPY TIME:  0 minutes 30 seconds  Dose: 0.84 mGy  Images: 3  FINDINGS: Dorsal plate and multiple screws identified at the distal RIGHT radius post ORIF of previously seen distal radial metaphyseal fracture.  Displacement of fragments and dorsal tilt of distal radial articular surface appear improved since the previous exam.  Diffuse osseous demineralization.  No additional acute osseous findings.  IMPRESSION: Post ORIF distal RIGHT radius.  Electronically Signed   By: Lavonia Dana M.D.   On: 04/10/2020 15:25 Note: Reviewed        Physical Exam  General appearance:  Well nourished, well developed, and well hydrated. In no apparent acute distress Mental status: Alert, oriented x 3 (person, place, & time)       Respiratory: No evidence of acute respiratory distress Eyes: PERLA Vitals: BP 126/68 (BP Location: Left Arm, Patient Position: Sitting, Cuff Size: Large)   Pulse (!) 103   Temp 98.8 F (37.1 C) (Oral)   Resp 16   Ht 5' 5.5" (1.664 m)   Wt 209 lb (94.8 kg)   SpO2 97%   BMI 34.25 kg/m  BMI: Estimated body mass index is 34.25 kg/m as calculated from the following:   Height as of this encounter: 5' 5.5" (1.664 m).   Weight as of this encounter: 209 lb (94.8 kg). Ideal: Ideal body weight: 58.2 kg (128 lb 3.2 oz) Adjusted ideal body weight: 72.8 kg (160 lb 8.3 oz)  Assessment   Status Diagnosis  Controlled Controlled Controlled 1. Chronic pain syndrome   2. Chronic flank pain (Primary Area of Pain) (Left)   3. Chronic low back pain (Bilateral) w/ sciatica (Bilateral)   4. Pharmacologic therapy   5. Neurogenic pain      Updated Problems: No problems updated.  Plan of Care  Problem-specific:  No problem-specific Assessment & Plan notes found for this encounter.  Ms. Prabhleen Montemayor has a current medication list which includes the following long-term medication(s): diltiazem, loratadine, nifedipine, [START ON 06/13/2020] diclofenac sodium, [START ON 06/13/2020] gabapentin, [START ON 06/13/2020] morphine, [START ON 07/13/2020] morphine, and [START ON 08/12/2020] morphine.  Pharmacotherapy (Medications Ordered): Meds ordered this encounter  Medications  . diclofenac Sodium (VOLTAREN) 1 % GEL    Sig: Apply 4 g topically 4 (four) times daily as needed.    Dispense:  350 g    Refill:  PRN    Fill one day early if pharmacy is closed on scheduled refill date. May substitute for generic, or similar, if available.  . gabapentin (NEURONTIN) 400 MG capsule    Sig: Take 1-2 capsules (400-800 mg total) by mouth 2 (two) times daily.    Dispense:  120  capsule    Refill:  2    Fill one day early if pharmacy is closed on scheduled refill date. May substitute for generic if available.  . morphine (MS CONTIN) 30 MG 12 hr tablet    Sig: Take 1 tablet (30 mg total) by mouth every 12 (twelve) hours. Must last 30 days    Dispense:  60 tablet    Refill:  0    Chronic Pain: STOP Act (Not applicable) Fill 1 day early if closed on refill date. Avoid benzodiazepines within 8 hours of opioids  . morphine (MS CONTIN) 30 MG 12 hr tablet    Sig: Take 1 tablet (30 mg total) by mouth every  12 (twelve) hours. Must last 30 days    Dispense:  60 tablet    Refill:  0    Chronic Pain: STOP Act (Not applicable) Fill 1 day early if closed on refill date. Avoid benzodiazepines within 8 hours of opioids  . morphine (MS CONTIN) 30 MG 12 hr tablet    Sig: Take 1 tablet (30 mg total) by mouth every 12 (twelve) hours. Must last 30 days    Dispense:  60 tablet    Refill:  0    Chronic Pain: STOP Act (Not applicable) Fill 1 day early if closed on refill date. Avoid benzodiazepines within 8 hours of opioids   Orders:  No orders of the defined types were placed in this encounter.  Follow-up plan:   Return in about 15 weeks (around 09/11/2020) for (F2F), (Med Mgmt).      Interventional management options:  Considering:   Diagnosticleft costochondral joint injection  Possible left intercostal nerve RFA  Therapeutic Left greater occipital nerve RFA Diagnostic left C2 + TON nerve block Possible left C2 + TON RFA    Palliative PRN treatment(s):   Palliative left intercostal nerve blocks (T5 to T8) #2  Palliative left GONB block #3  Palliative bilateral sacroiliac joint block #2      Recent Visits Date Type Provider Dept  03/08/20 Office Visit [name], MD [facility]  Showing recent visits within past 90 days and meeting all other requirements Today's Visits Date Type Provider Dept  05/29/20 Office Visit [name], MD [facility]  Showing today's visits and meeting all other requirements Future Appointments No visits were found meeting these conditions. Showing future appointments within next 90 days and meeting all other requirements  I discussed the assessment and treatment plan with the patient. The patient was provided an opportunity to ask questions and all were answered. The patient agreed with the plan and demonstrated an understanding of the instructions.  Patient advised to call back or seek an in-person evaluation if the symptoms or condition worsens.  Duration of encounter: 30 minutes.  Note by: Gaspar Cola, MD Date: 05/29/2020; Time: 2:46 PM

## 2020-05-29 ENCOUNTER — Encounter: Payer: Self-pay | Admitting: Pain Medicine

## 2020-05-29 ENCOUNTER — Other Ambulatory Visit: Payer: Self-pay

## 2020-05-29 ENCOUNTER — Ambulatory Visit: Payer: Medicare Other | Attending: Pain Medicine | Admitting: Pain Medicine

## 2020-05-29 VITALS — BP 126/68 | HR 103 | Temp 98.8°F | Resp 16 | Ht 65.5 in | Wt 209.0 lb

## 2020-05-29 DIAGNOSIS — G894 Chronic pain syndrome: Secondary | ICD-10-CM | POA: Insufficient documentation

## 2020-05-29 DIAGNOSIS — R109 Unspecified abdominal pain: Secondary | ICD-10-CM | POA: Diagnosis not present

## 2020-05-29 DIAGNOSIS — Z79899 Other long term (current) drug therapy: Secondary | ICD-10-CM | POA: Diagnosis not present

## 2020-05-29 DIAGNOSIS — M5441 Lumbago with sciatica, right side: Secondary | ICD-10-CM

## 2020-05-29 DIAGNOSIS — M5442 Lumbago with sciatica, left side: Secondary | ICD-10-CM | POA: Diagnosis not present

## 2020-05-29 DIAGNOSIS — M792 Neuralgia and neuritis, unspecified: Secondary | ICD-10-CM | POA: Diagnosis present

## 2020-05-29 DIAGNOSIS — G8929 Other chronic pain: Secondary | ICD-10-CM

## 2020-05-29 MED ORDER — MORPHINE SULFATE ER 30 MG PO TBCR: 30.0000 mg | EXTENDED_RELEASE_TABLET | Freq: Two times a day (BID) | ORAL | 0 refills | Status: DC

## 2020-05-29 MED ORDER — GABAPENTIN 400 MG PO CAPS: 400.0000 mg | ORAL_CAPSULE | Freq: Two times a day (BID) | ORAL | 2 refills | Status: DC

## 2020-05-29 MED ORDER — DICLOFENAC SODIUM 1 % EX GEL: 4.0000 g | Freq: Four times a day (QID) | CUTANEOUS | 99 refills | Status: AC | PRN

## 2020-05-29 NOTE — Progress Notes (Signed)
Nursing Pain Medication Assessment:  Safety precautions to be maintained throughout the outpatient stay will include: orient to surroundings, keep bed in low position, maintain call bell within reach at all times, provide assistance with transfer out of bed and ambulation.  Medication Inspection Compliance: Pill count conducted under aseptic conditions, in front of the patient. Neither the pills nor the bottle was removed from the patient's sight at any time. Once count was completed pills were immediately returned to the patient in their original bottle.  Medication: Morphine ER (MSContin) Pill/Patch Count: 29 of 60 pills remain Pill/Patch Appearance: Markings consistent with prescribed medication Bottle Appearance: Standard pharmacy container. Clearly labeled. Filled Date: 10 / 03 / 2021 Last Medication intake:  Today

## 2020-06-12 ENCOUNTER — Telehealth: Payer: Self-pay | Admitting: Internal Medicine

## 2020-06-12 DIAGNOSIS — R053 Chronic cough: Secondary | ICD-10-CM

## 2020-06-12 MED ORDER — BENZONATATE 200 MG PO CAPS: 200.0000 mg | ORAL_CAPSULE | Freq: Three times a day (TID) | ORAL | 1 refills | Status: DC | PRN

## 2020-06-12 NOTE — Telephone Encounter (Signed)
Called and spoke to patient, who is requesting Rx for Tessalon pearls.  Patient reports of deep dry cough and chest discomfort with coughing spell. Cough has been present for 1.5y.  Denied fever, chills, sweats or additional sx.  Patient has been seen GI and allergist with no answers. She will be traveling out of town on wednesday and would like tessalon prior to traveling.   Dr. Melvyn Novas, please advise. Thanks

## 2020-06-12 NOTE — Telephone Encounter (Signed)
Ok to refill tessalon x one but set up appt with me at whichever office is  closer to her to regroup with all meds in hand

## 2020-06-12 NOTE — Telephone Encounter (Signed)
Called and spoke with pt letting her know the info stated by MW and she verbalized understanding. Rx has been sent to preferred pharmacy for pt. Pt stated that she would need to call back to get appt scheduled. Nothing further needed.

## 2020-06-27 ENCOUNTER — Telehealth: Payer: Self-pay | Admitting: Internal Medicine

## 2020-06-27 NOTE — Telephone Encounter (Signed)
Spoke with the pt  She states that her cough has not improved since the last visit  She has some days where she coughs constantly and others not at all  Denies having any f/c/s, SOB, wheezing, CP or other symptoms  Following all directions per AVS  Per MW -ok to add on next wk  Appt scheduled for 07/07/20- first available she could come  Advised to call sooner if needed

## 2020-07-07 ENCOUNTER — Encounter: Payer: Self-pay | Admitting: Internal Medicine

## 2020-07-07 ENCOUNTER — Other Ambulatory Visit: Payer: Self-pay

## 2020-07-07 ENCOUNTER — Ambulatory Visit: Payer: Medicare Other | Admitting: Internal Medicine

## 2020-07-07 DIAGNOSIS — R053 Chronic cough: Secondary | ICD-10-CM

## 2020-07-07 DIAGNOSIS — Z23 Encounter for immunization: Secondary | ICD-10-CM | POA: Diagnosis not present

## 2020-07-07 MED ORDER — GABAPENTIN 100 MG PO CAPS: 100.0000 mg | ORAL_CAPSULE | Freq: Three times a day (TID) | ORAL | 2 refills | Status: DC

## 2020-07-07 NOTE — Assessment & Plan Note (Addendum)
Onset ? Around 2009 prev eval by Dr Mamie Nick Wright/ better off ACEi  - Allergy eval done early [place] for rhinitis and recurrent sinusitis pos only dust > avoidance. -Spirometry 06/03/18 nl  - HRCT chest 06/22/18 1. Negative for interstitial lung disease. 2. Minimal air trapping, indicative of small airways disease - Dg UGI 04/26/19 1. Esophageal dysmotility. 2. 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 Fob 07/14/19 Laryngeal edema.  - Hyperplastic changes were seen in the larynx.   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 > did not take any meds for it "I don't have any nasal symptoms"   - 07/07/2020 rec titrate gabapentin to max of 1200 mg per day/ added 1st gen H1 blockers per guidelines  And referred to Dr Joya Gaskins at [facility] voice center  Comment: Upper airway cough syndrome (previously labeled PNDS),  is so named because it's frequently impossible to sort out how much is  CR/sinusitis with freq throat clearing (which can be related to primary GERD)   vs  causing  secondary (" extra esophageal")  GERD from wide swings in gastric pressure that occur with throat clearing, often  promoting self use of mint and menthol lozenges that reduce the lower esophageal sphincter tone and exacerbate the problem further in a cyclical fashion.   These are the same pts (now being labeled as having "irritable larynx syndrome" by some cough centers) who not infrequently have a history of having failed to tolerate ace inhibitors,  dry powder inhalers or biphosphonates or report having atypical/extraesophageal reflux symptoms that don't respond to standard doses of PPI  and are easily confused as having aecopd or asthma flares by even experienced allergists/ pulmonologists (myself included).   >>> for now try up to 1200 mg gabapentin per day by titrating up to that level and  preventing cns side effects from the peaks of the 400 mg dose  (give 400 first thing in am and hs but 100 mg 1-2 at lunch and supper as tol  >>> 1st gen H1 blockers per guidelines  Try doses at hs only for now and use daytime prn if tol cns effects   F/u is per Dr Joya Gaskins at [facility] and we can see her here again prn    Discussed in detail all the  indications, usual  risks and alternatives  relative to the benefits with patient who agrees to proceed with Rx as outlined.           Each maintenance medication was reviewed in detail including emphasizing most importantly the difference between maintenance and prns and under what circumstances the prns are to be triggered using an action plan format where appropriate.  Total time for H and P, chart review, counseling,  and generating customized AVS unique to this office visit / charting =39 min

## 2020-07-07 NOTE — Patient Instructions (Signed)
Try to build up your gabapentin to take 100mg   1 or 2 at lunch and supper in addition to the 400mg  first thing in am and at bedtime   For drainage / throat tickle try take CHLORPHENIRAMINE  4 mg  (Chlortab 4mg   at [employer] should be easiest to find in the green box)  take one every 4 hours as needed - available over the counter- may cause drowsiness so start with just a  dose or two an hour before bed  and see how you tolerate it before trying in daytime   [facility] will be arranging for you to see Dr Carol Ada at [facility] ENT specialist   Pulmonary follow up here is as needed
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
## 2020-07-13 ENCOUNTER — Telehealth: Payer: Self-pay | Admitting: Internal Medicine

## 2020-07-13 NOTE — Telephone Encounter (Signed)
LMTCB for the pt 

## 2020-07-14 ENCOUNTER — Telehealth: Payer: Self-pay | Admitting: Internal Medicine

## 2020-07-14 DIAGNOSIS — R053 Chronic cough: Secondary | ICD-10-CM

## 2020-07-14 MED ORDER — AMOXICILLIN-POT CLAVULANATE 875-125 MG PO TABS: 1.0000 | ORAL_TABLET | Freq: Two times a day (BID) | ORAL | 0 refills | Status: DC

## 2020-07-14 NOTE — Telephone Encounter (Signed)
Spoke with pt, c/o headache, sinus pressure, pnd, getting green mucus from sinuses.  Denies fever, cough, chest tightness/pain.  S/s present X2 days.  Pt has not taken anything to help with s/s.  Requesting recs.  [facility] on [address]  MW please advise on recs.  Thanks!

## 2020-07-14 NOTE — Telephone Encounter (Signed)
Patient states having symptoms of nasal congestion, headache, and mucus. Pharmacy is [facility] [address]. Patient phone number is [phone].

## 2020-07-14 NOTE — Telephone Encounter (Signed)
Rx sent- LMTCB for the pt

## 2020-07-14 NOTE — Telephone Encounter (Signed)
Spoke with the pt and notified of recs per MW  She verbalized understanding  Nothing further needed 

## 2020-07-14 NOTE — Telephone Encounter (Signed)
Augmentin 875 mg take one pill twice daily  X 10 days - take at breakfast and supper with large glass of water.  It would help reduce the usual side effects (diarrhea and yeast infections) if you ate cultured yogurt at lunch.  

## 2020-07-14 NOTE — Telephone Encounter (Signed)
lmtcb for pt.  Per 11/26 OV note pt was supposed to be referred to Dr. Bettina Gavia office, but this referral was not placed.  I have placed the appropriate referral.

## 2020-07-20 NOTE — Telephone Encounter (Signed)
Lmtcb for pt.  

## 2020-07-31 NOTE — Telephone Encounter (Signed)
Lmtcb for pt.  Will close encounter as there have been several unsuccessful attempts to reach pt without a return call.

## 2020-08-24 DIAGNOSIS — R7303 Prediabetes: Secondary | ICD-10-CM | POA: Diagnosis not present

## 2020-08-24 DIAGNOSIS — E559 Vitamin D deficiency, unspecified: Secondary | ICD-10-CM | POA: Diagnosis not present

## 2020-08-24 DIAGNOSIS — E673 Hypervitaminosis D: Secondary | ICD-10-CM | POA: Diagnosis not present

## 2020-08-24 DIAGNOSIS — G8928 Other chronic postprocedural pain: Secondary | ICD-10-CM | POA: Diagnosis not present

## 2020-08-24 DIAGNOSIS — K911 Postgastric surgery syndromes: Secondary | ICD-10-CM | POA: Diagnosis not present

## 2020-08-24 DIAGNOSIS — I10 Essential (primary) hypertension: Secondary | ICD-10-CM | POA: Diagnosis not present

## 2020-08-24 DIAGNOSIS — R1310 Dysphagia, unspecified: Secondary | ICD-10-CM | POA: Diagnosis not present

## 2020-08-24 DIAGNOSIS — K219 Gastro-esophageal reflux disease without esophagitis: Secondary | ICD-10-CM | POA: Diagnosis not present

## 2020-08-24 DIAGNOSIS — J3089 Other allergic rhinitis: Secondary | ICD-10-CM | POA: Diagnosis not present

## 2020-08-24 DIAGNOSIS — M810 Age-related osteoporosis without current pathological fracture: Secondary | ICD-10-CM | POA: Diagnosis not present

## 2020-08-25 DIAGNOSIS — G8928 Other chronic postprocedural pain: Secondary | ICD-10-CM | POA: Diagnosis not present

## 2020-08-25 DIAGNOSIS — K219 Gastro-esophageal reflux disease without esophagitis: Secondary | ICD-10-CM | POA: Diagnosis not present

## 2020-08-25 DIAGNOSIS — R7303 Prediabetes: Secondary | ICD-10-CM | POA: Diagnosis not present

## 2020-08-25 DIAGNOSIS — J3089 Other allergic rhinitis: Secondary | ICD-10-CM | POA: Diagnosis not present

## 2020-08-25 DIAGNOSIS — M810 Age-related osteoporosis without current pathological fracture: Secondary | ICD-10-CM | POA: Diagnosis not present

## 2020-08-29 DIAGNOSIS — R059 Cough, unspecified: Secondary | ICD-10-CM | POA: Insufficient documentation

## 2020-08-29 DIAGNOSIS — J384 Edema of larynx: Secondary | ICD-10-CM | POA: Diagnosis not present

## 2020-08-29 DIAGNOSIS — R053 Chronic cough: Secondary | ICD-10-CM | POA: Diagnosis not present

## 2020-08-29 DIAGNOSIS — J383 Other diseases of vocal cords: Secondary | ICD-10-CM | POA: Diagnosis not present

## 2020-08-29 DIAGNOSIS — J387 Other diseases of larynx: Secondary | ICD-10-CM | POA: Diagnosis not present

## 2020-08-31 DIAGNOSIS — F1120 Opioid dependence, uncomplicated: Secondary | ICD-10-CM | POA: Insufficient documentation

## 2020-08-31 NOTE — Progress Notes (Signed)
PROVIDER NOTE: Information contained herein reflects review and annotations entered in association with encounter. Interpretation of such information and data should be left to medically-trained personnel. Information provided to patient can be located elsewhere in the medical record under "Patient Instructions". Document created using STT-dictation technology, any transcriptional errors that may result from process are unintentional.    Patient: Kari Gibbs  Service Category: E/M  Provider: Gaspar Cola, MD  DOB: April 11, 1951  DOS: 09/04/2020  Specialty: Interventional Pain Management  MRN: [id number]  Setting: Ambulatory outpatient  PCP: Kari Caraway, MD  Type: Established Patient    Referring Provider: Cari Caraway, MD  Location: Office  Delivery: Face-to-face     HPI  [name], a 70 y.o. year old female, is here today because of her Chronic pain syndrome [G89.4]. [name] primary complain today is Chest Pain Last encounter: My last encounter with her was on 05/29/2020. Pertinent problems: [name] has Fracture of rib (4th and 8th rib) (Left); Chronic pain syndrome; Chronic flank pain (Primary Area of Pain) (Left); Chronic thoracic radicular pain (Left-sided); Chronic occipital neuralgia (Left); Neuropathic pain; Neurogenic pain; History of rib fracture (4th and 8th rib) (Left); Vertebral body hemangioma (T11); Metatarsalgia of foot (Right); Chronic abdominal pain (Bilateral) (L>R) (possible left-sided thoracic radiculopathy); Primary osteoarthritis; Chronic neck pain (Bilateral) (L>R); Cervical facet syndrome (Bilateral) (L>R); Muscle spasticity; Tremors of nervous system; Muscle twitching; Cervical spondylosis; Chronic low back pain (Bilateral) w/o sciatica; Acute costochondritis (Left); Chronic low back pain (Bilateral) w/ sciatica (Bilateral); Chronic sacroiliac joint pain (Bilateral); Other specified dorsopathies, sacral and sacrococcygeal region; and Chronic  sacroiliac joint somatic dysfunction (Bilateral) on their pertinent problem list. Pain Assessment: Severity of Chronic pain is reported as a 4 /10. Location: Chest Left/pain radiaties from the left chest to the middle back. Onset: More than a month ago. Quality: Aching,Burning,Numbness. Timing: Constant. Modifying factor(s): lay down heat, meds,. Vitals:  height is $RemoveB'5\' 6"'QujhEyoa$  (1.676 m) and weight is 204 lb (92.5 kg). Her temperature is 97.3 F (36.3 C) (abnormal). Her blood pressure is 152/60 (abnormal) and her pulse is 82. Her oxygen saturation is 100%.   Reason for encounter: medication management.   The patient indicates doing well with the current medication regimen. No adverse reactions or side effects reported to the medications.  The patient indicates currently been unable to do a "Drug Holiday" due to the fact that she is currently being worked up for a chronic cough that she has had for the past 2 years.  Today she went on for a long time "putting me up to speed" regarding what she has been doing for this cough in terms of her work-up and the physicians that she has seen.  She does understand that this has nothing to do with what we are seeing her for, however, she simply wanted [place] to know that this was the reason why she rather not the "Drug Holiday" right now.  I told the patient that we will go ahead and do it whenever she is ready.  RTCB: 12/10/2020 Nonopioids transferred 05/29/2020: Gabapentin and Voltaren gel  Pharmacotherapy Assessment   Analgesic: MS Contin 30 mg, 1 tab PO q 12 hrs (60 mg/day of morphine) MME/day:60 mg/day.   Monitoring: [place] PMP: PDMP reviewed during this encounter.       Pharmacotherapy: No side-effects or adverse reactions reported. Compliance: No problems identified. Effectiveness: Clinically acceptable.  Kari Fischer, RN  09/04/2020 10:54 AM  Sign when Signing Visit Nursing Pain Medication Assessment:  Safety  precautions to be maintained throughout the  outpatient stay will include: orient to surroundings, keep bed in low position, maintain call bell within reach at all times, provide assistance with transfer out of bed and ambulation.  Medication Inspection Compliance: Pill count conducted under aseptic conditions, in front of the patient. Neither the pills nor the bottle was removed from the patient's sight at any time. Once count was completed pills were immediately returned to the patient in their original bottle.  Medication: Morphine IR Pill/Patch Count: 13 of 60 pills remain Pill/Patch Appearance: Markings consistent with prescribed medication Bottle Appearance: Standard pharmacy container. Clearly labeled. Filled Date: 1 / 1 / 22 Last Medication intake:  TodaySafety precautions to be maintained throughout the outpatient stay will include: orient to surroundings, keep bed in low position, maintain call bell within reach at all times, provide assistance with transfer out of bed and ambulation.     UDS:  Summary  Date Value Ref Range Status  12/09/2019 Note  Final    Comment:    ==================================================================== [employer] Select 13 (MW) ==================================================================== Test                             Result       Flag       Units Drug Present and Declared for Prescription Verification   Morphine                       >7937        EXPECTED   ng/mg creat   Normorphine                    913          EXPECTED   ng/mg creat    Potential sources of large amounts of morphine in the absence of    codeine include administration of morphine or use of heroin.    Normorphine is an expected metabolite of morphine.   Hydromorphone                  309          EXPECTED   ng/mg creat    Hydromorphone may be present as a metabolite of morphine;    concentrations of hydromorphone rarely exceed 5% of the morphine    concentration when this is the source of  hydromorphone. ==================================================================== Test                      Result    Flag   Units      Ref Range   Creatinine              126              mg/dL      >=47 ==================================================================== Declared Medications:  The flagging and interpretation on this report are based on the  following declared medications.  Unexpected results may arise from  inaccuracies in the declared medications.  **Note: The testing scope of this panel includes these medications:  Morphine  **Note: The testing scope of this panel does not include the  following reported medications:  Azelastine  Benzonatate  Buspirone  Cholecalciferol  Cranberry  Cyanocobalamin  Dextromethorphan  Esomeprazole (Nexium)  Gabapentin  Irbesartan  Iron  Loratadine  Magnesium  Multivitamin  Naloxone  Nifedipine  Probiotic  Promethazine  Topical Diclofenac  Venlafaxine  Vitamin C ==================================================================== For clinical  consultation, please call [phone]. ====================================================================      ROS  Constitutional: Denies any fever or chills Gastrointestinal: No reported hemesis, hematochezia, vomiting, or acute GI distress Musculoskeletal: Denies any acute onset joint swelling, redness, loss of ROM, or weakness Neurological: No reported episodes of acute onset apraxia, aphasia, dysarthria, agnosia, amnesia, paralysis, loss of coordination, or loss of consciousness  Medication Review  Biotin, Cranberry, Flutter, Iron-Vitamin C, LORazepam, Magnesium, Peppermint Oil, Prenatal MV & Min w/FA-DHA, RA Probiotic Gummies, Vitamin D3, acetaminophen, azelastine, benzonatate, busPIRone, conjugated estrogens, diclofenac Sodium, esomeprazole, gabapentin, ibuprofen, irbesartan, loratadine, montelukast, morphine, naloxone, potassium gluconate, and  promethazine-dextromethorphan  History Review  Allergy: Ms. Drummonds is allergic to iohexol, ace inhibitors, losartan potassium, and neosporin [neomycin-bacitracin zn-polymyx]. Drug: Ms. Yera  reports no history of drug use. Alcohol:  reports previous alcohol use. Tobacco:  reports that she has never smoked. She has never used smokeless tobacco. Social: Ms. Eskridge  reports that she has never smoked. She has never used smokeless tobacco. She reports previous alcohol use. She reports that she does not use drugs. 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 Surgical: Ms. Mollica  has a past surgical history that includes Gastric bypass (11/13/2009); Exploratory laparotomy (2012); TMJ- LEFT--ABOUT 10 YRS AGO--LEFT SIDE--PT NOW HAS SOME PAIN LEFT JAW (Left); laparoscopy (03/04/2012); Hernia repair (2011); Abdominal hysterectomy (1988); Cholecystectomy (N/A, 05/18/2013); Shoulder surgery (Right, 06/16/2017); Endovenous ablation saphenous vein w/ laser (Left, 02/04/2018); Esophageal manometry (N/A, 04/05/2019); Video bronchoscopy (Bilateral, 07/14/2019); and Open reduction internal fixation (orif) distal radial fracture (Right, 04/10/2020). Family: family history includes Alzheimer's disease in her father; Hypertension in her mother.  Laboratory Chemistry Profile   Renal Lab Results  Component Value Date   BUN 20 01/04/2016   CREATININE 0.76 01/04/2016   GFRAA >60 01/04/2016   GFRNONAA >60 01/04/2016     Hepatic Lab Results  Component Value Date    AST 33 01/04/2016   ALT 26 01/04/2016   ALBUMIN 4.2 01/04/2016   ALKPHOS 97 01/04/2016   AMYLASE 30 02/27/2010   LIPASE 22 10/12/2012     Electrolytes Lab Results  Component Value Date   NA 139 01/04/2016   K 4.6 01/04/2016   CL 99 (L) 01/04/2016   CALCIUM 9.7 01/04/2016   MG 2.1 01/04/2016     Bone Lab Results  Component Value Date   25OHVITD1 49 01/04/2016   25OHVITD2 25 01/04/2016   25OHVITD3 24 01/04/2016     Inflammation (CRP: Acute Phase) (ESR: Chronic Phase) Lab Results  Component Value Date   CRP <0.5 01/04/2016   ESRSEDRATE 17 01/04/2016       Note: Above Lab results reviewed.  Recent Imaging Review  DG C-Arm 1-60 Min CLINICAL DATA:  ORIF RIGHT wrist  EXAM: RIGHT WRIST - COMPLETE 3+ VIEW; DG C-ARM 1-60 MIN  COMPARISON:  04/05/2020  FLUOROSCOPY TIME:  0 minutes 30 seconds  Dose: 0.84 mGy  Images: 3  FINDINGS: Dorsal plate and multiple screws identified at the distal RIGHT radius post ORIF of previously seen distal radial metaphyseal fracture.  Displacement of fragments and dorsal tilt of distal radial articular surface appear improved since the previous exam.  Diffuse osseous demineralization.  No additional acute osseous findings.  IMPRESSION: Post ORIF distal RIGHT radius.  Electronically Signed   By: Lavonia Dana M.D.   On: 04/10/2020 15:25 DG Wrist Complete Right CLINICAL DATA:  ORIF RIGHT wrist  EXAM: RIGHT WRIST - COMPLETE 3+ VIEW; DG C-ARM 1-60 MIN  COMPARISON:  04/05/2020  FLUOROSCOPY TIME:  0 minutes 30 seconds  Dose: 0.84 mGy  Images: 3  FINDINGS: Dorsal plate and multiple screws identified at the distal RIGHT radius post ORIF of previously seen distal radial metaphyseal fracture.  Displacement of fragments and dorsal tilt of distal radial articular surface appear improved since the previous exam.  Diffuse osseous demineralization.  No additional acute osseous findings.  IMPRESSION: Post ORIF distal RIGHT  radius.  Electronically Signed   By: Lavonia Dana M.D.   On: 04/10/2020 15:25 Note: Reviewed        Physical Exam  General appearance: Well nourished, well developed, and well hydrated. In no apparent acute distress Mental status: Alert, oriented x 3 (person, place, & time)       Respiratory: No evidence of acute respiratory distress Eyes: PERLA Vitals: BP (!) 152/60   Pulse 82   Temp (!) 97.3 F (36.3 C)   Ht $R'5\' 6"'iI$  (1.676 m)   Wt 204 lb (92.5 kg)   SpO2 100%   BMI 32.93 kg/m  BMI: Estimated body mass index is 32.93 kg/m as calculated from the following:   Height as of this encounter: $RemoveBeforeD'5\' 6"'SPTIYBTDqbiZwk$  (1.676 m).   Weight as of this encounter: 204 lb (92.5 kg). Ideal: Ideal body weight: 59.3 kg (130 lb 11.7 oz) Adjusted ideal body weight: 72.6 kg (160 lb 0.6 oz)  Assessment   Status Diagnosis  Controlled Controlled Controlled 1. Chronic pain syndrome   2. Chronic flank pain (Primary Area of Pain) (Left)   3. Chronic low back pain (Bilateral) w/ sciatica (Bilateral)   4. Chronic thoracic radicular pain (Left-sided)   5. History of rib fracture (4th and 8th rib) (Left)   6. Pharmacologic therapy   7. Uncomplicated opioid dependence ([place])      Updated Problems: Problem  Primary Osteoarthritis  Opioid Dependence ([facility])  Obesity  Gastro-Esophageal Reflux Disease Without Esophagitis  Acne  Age-Related Osteoporosis Without Current Pathological Fracture  Amnesia  Anxiety  Bronchiolectasis ([facility])  Chronic Postoperative Pain  Dysphagia  Family History of Malignant Neoplasm of Gastrointestinal Tract  Hypervitaminosis D  Iron Deficiency Anemia  Epigastric Pain  Left Upper Quadrant Pain  Other Allergy Status, Other Than to Drugs and Biological Substances  Postgastric Surgery Syndrome  Recurrent Major Depression in Remission ([facility])  Vitamin D Deficiency  Esophageal Dysmotility  Constipation  Cough    Plan of Care  Problem-specific:  No problem-specific Assessment & Plan notes  found for this encounter.  Ms. Kalan Rinn has a current medication list which includes the following long-term medication(s): diclofenac sodium, gabapentin, gabapentin, loratadine, morphine, [START ON 09/11/2020] morphine, [START ON 10/11/2020] morphine, and [START ON 11/10/2020] morphine.  Pharmacotherapy (Medications Ordered): Meds ordered this encounter  Medications  . morphine (MS CONTIN) 30 MG 12 hr tablet    Sig: Take 1 tablet (30 mg total) by mouth every 12 (twelve) hours. Must last 30 days    Dispense:  60 tablet    Refill:  0    Chronic Pain: STOP Act (Not applicable) Fill 1 day early if closed on refill date. Avoid benzodiazepines within 8 hours of opioids  . morphine (MS CONTIN) 30 MG 12 hr tablet  Sig: Take 1 tablet (30 mg total) by mouth every 12 (twelve) hours. Must last 30 days    Dispense:  60 tablet    Refill:  0    Chronic Pain: STOP Act (Not applicable) Fill 1 day early if closed on refill date. Avoid benzodiazepines within 8 hours of opioids  . morphine (MS CONTIN) 30 MG 12 hr tablet    Sig: Take 1 tablet (30 mg total) by mouth every 12 (twelve) hours. Must last 30 days    Dispense:  60 tablet    Refill:  0    Chronic Pain: STOP Act (Not applicable) Fill 1 day early if closed on refill date. Avoid benzodiazepines within 8 hours of opioids   Orders:  No orders of the defined types were placed in this encounter.  Follow-up plan:   Return in about 3 months (around 12/10/2020) for (F2F), (Med Mgmt).     Interventional management options:  Considering:   Diagnosticleft costochondral joint injection  Possible left intercostal nerve RFA  Therapeutic Left greater occipital nerve RFA Diagnostic left C2 + TON nerve block Possible left C2 + TON RFA    Palliative PRN treatment(s):   Palliative left intercostal nerve blocks (T5 to T8) #2  Palliative left GONB block #3  Palliative bilateral sacroiliac joint block #2     Recent Visits No visits were found meeting  these conditions. Showing recent visits within past 90 days and meeting all other requirements Today's Visits Date Type Provider Dept  09/04/20 Office Visit [name], MD [facility]  Showing today's visits and meeting all other requirements Future Appointments No visits were found meeting these conditions. Showing future appointments within next 90 days and meeting all other requirements  I discussed the assessment and treatment plan with the patient. The patient was provided an opportunity to ask questions and all were answered. The patient agreed with the plan and demonstrated an understanding of the instructions.  Patient advised to call back or seek an in-person evaluation if the symptoms or condition worsens.  Duration of encounter: 30 minutes.  Note by: Kari Cola, MD Date: 09/04/2020; Time: 11:17 AM

## 2020-09-04 ENCOUNTER — Other Ambulatory Visit: Payer: Self-pay

## 2020-09-04 ENCOUNTER — Encounter: Payer: Self-pay | Admitting: Pain Medicine

## 2020-09-04 ENCOUNTER — Ambulatory Visit: Payer: Medicare Other | Attending: Pain Medicine | Admitting: Pain Medicine

## 2020-09-04 VITALS — BP 152/60 | HR 82 | Temp 97.3°F | Ht 66.0 in | Wt 204.0 lb

## 2020-09-04 DIAGNOSIS — D509 Iron deficiency anemia, unspecified: Secondary | ICD-10-CM | POA: Insufficient documentation

## 2020-09-04 DIAGNOSIS — G8929 Other chronic pain: Secondary | ICD-10-CM | POA: Diagnosis not present

## 2020-09-04 DIAGNOSIS — Z8781 Personal history of (healed) traumatic fracture: Secondary | ICD-10-CM | POA: Diagnosis not present

## 2020-09-04 DIAGNOSIS — K911 Postgastric surgery syndromes: Secondary | ICD-10-CM | POA: Insufficient documentation

## 2020-09-04 DIAGNOSIS — M5442 Lumbago with sciatica, left side: Secondary | ICD-10-CM | POA: Diagnosis not present

## 2020-09-04 DIAGNOSIS — F3340 Major depressive disorder, recurrent, in remission, unspecified: Secondary | ICD-10-CM | POA: Insufficient documentation

## 2020-09-04 DIAGNOSIS — R413 Other amnesia: Secondary | ICD-10-CM | POA: Insufficient documentation

## 2020-09-04 DIAGNOSIS — G8928 Other chronic postprocedural pain: Secondary | ICD-10-CM | POA: Insufficient documentation

## 2020-09-04 DIAGNOSIS — R1013 Epigastric pain: Secondary | ICD-10-CM | POA: Insufficient documentation

## 2020-09-04 DIAGNOSIS — R109 Unspecified abdominal pain: Secondary | ICD-10-CM | POA: Diagnosis not present

## 2020-09-04 DIAGNOSIS — F1120 Opioid dependence, uncomplicated: Secondary | ICD-10-CM | POA: Diagnosis present

## 2020-09-04 DIAGNOSIS — L709 Acne, unspecified: Secondary | ICD-10-CM | POA: Insufficient documentation

## 2020-09-04 DIAGNOSIS — M5441 Lumbago with sciatica, right side: Secondary | ICD-10-CM

## 2020-09-04 DIAGNOSIS — E559 Vitamin D deficiency, unspecified: Secondary | ICD-10-CM | POA: Insufficient documentation

## 2020-09-04 DIAGNOSIS — Z79899 Other long term (current) drug therapy: Secondary | ICD-10-CM

## 2020-09-04 DIAGNOSIS — G894 Chronic pain syndrome: Secondary | ICD-10-CM | POA: Diagnosis not present

## 2020-09-04 DIAGNOSIS — R1310 Dysphagia, unspecified: Secondary | ICD-10-CM | POA: Insufficient documentation

## 2020-09-04 DIAGNOSIS — M810 Age-related osteoporosis without current pathological fracture: Secondary | ICD-10-CM | POA: Insufficient documentation

## 2020-09-04 DIAGNOSIS — M5414 Radiculopathy, thoracic region: Secondary | ICD-10-CM | POA: Insufficient documentation

## 2020-09-04 DIAGNOSIS — F419 Anxiety disorder, unspecified: Secondary | ICD-10-CM | POA: Insufficient documentation

## 2020-09-04 DIAGNOSIS — Z800 Family history of malignant neoplasm of digestive organs: Secondary | ICD-10-CM | POA: Insufficient documentation

## 2020-09-04 DIAGNOSIS — R1012 Left upper quadrant pain: Secondary | ICD-10-CM | POA: Insufficient documentation

## 2020-09-04 DIAGNOSIS — K224 Dyskinesia of esophagus: Secondary | ICD-10-CM | POA: Insufficient documentation

## 2020-09-04 DIAGNOSIS — J479 Bronchiectasis, uncomplicated: Secondary | ICD-10-CM | POA: Insufficient documentation

## 2020-09-04 DIAGNOSIS — Z9109 Other allergy status, other than to drugs and biological substances: Secondary | ICD-10-CM | POA: Insufficient documentation

## 2020-09-04 DIAGNOSIS — K5900 Constipation, unspecified: Secondary | ICD-10-CM | POA: Insufficient documentation

## 2020-09-04 DIAGNOSIS — E673 Hypervitaminosis D: Secondary | ICD-10-CM | POA: Insufficient documentation

## 2020-09-04 MED ORDER — MORPHINE SULFATE ER 30 MG PO TBCR: 30.0000 mg | EXTENDED_RELEASE_TABLET | Freq: Two times a day (BID) | ORAL | 0 refills | Status: DC

## 2020-09-04 NOTE — Patient Instructions (Signed)
____________________________________________________________________________________________  Medication Rules  Purpose: To inform patients, and their family members, of our rules and regulations.  Applies to: All patients receiving prescriptions (written or electronic).  Pharmacy of record: Pharmacy where electronic prescriptions will be sent. If written prescriptions are taken to a different pharmacy, please inform the nursing staff. The pharmacy listed in the electronic medical record should be the one where you would like electronic prescriptions to be sent.  Electronic prescriptions: In compliance with the [place] Strengthen Opioid Misuse Prevention (STOP) Act of 2017 (Session Law 2017-74/H243), effective August 12, 2018, all controlled substances must be electronically prescribed. Calling prescriptions to the pharmacy will cease to exist.  Prescription refills: Only during scheduled appointments. Applies to all prescriptions.  NOTE: The following applies primarily to controlled substances (Opioid* Pain Medications).   Type of encounter (visit): For patients receiving controlled substances, face-to-face visits are required. (Not an option or up to the patient.)  Patient's responsibilities: 1. Pain Pills: Bring all pain pills to every appointment (except for procedure appointments). 2. Pill Bottles: Bring pills in original pharmacy bottle. Always bring the newest bottle. Bring bottle, even if empty. 3. Medication refills: You are responsible for knowing and keeping track of what medications you take and those you need refilled. The day before your appointment: write a list of all prescriptions that need to be refilled. The day of the appointment: give the list to the admitting nurse. Prescriptions will be written only during appointments. No prescriptions will be written on procedure days. If you forget a medication: it will not be "Called in", "Faxed", or "electronically sent".  You will need to get another appointment to get these prescribed. No early refills. Do not call asking to have your prescription filled early. 4. Prescription Accuracy: You are responsible for carefully inspecting your prescriptions before leaving our office. Have the discharge nurse carefully go over each prescription with you, before taking them home. Make sure that your name is accurately spelled, that your address is correct. Check the name and dose of your medication to make sure it is accurate. Check the number of pills, and the written instructions to make sure they are clear and accurate. Make sure that you are given enough medication to last until your next medication refill appointment. 5. Taking Medication: Take medication as prescribed. When it comes to controlled substances, taking less pills or less frequently than prescribed is permitted and encouraged. Never take more pills than instructed. Never take medication more frequently than prescribed.  6. Inform other Doctors: Always inform, all of your healthcare providers, of all the medications you take. 7. Pain Medication from other Providers: You are not allowed to accept any additional pain medication from any other Doctor or Healthcare provider. There are two exceptions to this rule. (see below) In the event that you require additional pain medication, you are responsible for notifying us, as stated below. 8. Cough Medicine: Often these contain an opioid, such as codeine or hydrocodone. Never accept or take cough medicine containing these opioids if you are already taking an opioid* medication. The combination may cause respiratory failure and death. 9. Medication Agreement: You are responsible for carefully reading and following our Medication Agreement. This must be signed before receiving any prescriptions from our practice. Safely store a copy of your signed Agreement. Violations to the Agreement will result in no further prescriptions.  (Additional copies of our Medication Agreement are available upon request.) 10. Laws, Rules, & Regulations: All patients are expected to follow all   Federal and State Laws, Statutes, Rules, & Regulations. Ignorance of the Laws does not constitute a valid excuse.  11. Illegal drugs and Controlled Substances: The use of illegal substances (including, but not limited to marijuana and its derivatives) and/or the illegal use of any controlled substances is strictly prohibited. Violation of this rule may result in the immediate and permanent discontinuation of any and all prescriptions being written by our practice. The use of any illegal substances is prohibited. 12. Adopted CDC guidelines & recommendations: Target dosing levels will be at or below 60 MME/day. Use of benzodiazepines** is not recommended.  Exceptions: There are only two exceptions to the rule of not receiving pain medications from other Healthcare Providers. 1. Exception #1 (Emergencies): In the event of an emergency (i.e.: accident requiring emergency care), you are allowed to receive additional pain medication. However, you are responsible for: As soon as you are able, call our office [phone], at any time of the day or night, and leave a message stating your name, the date and nature of the emergency, and the name and dose of the medication prescribed. In the event that your call is answered by a member of our staff, make sure to document and save the date, time, and the name of the person that took your information.  2. Exception #2 (Planned Surgery): In the event that you are scheduled by another doctor or dentist to have any type of surgery or procedure, you are allowed (for a period no longer than 30 days), to receive additional pain medication, for the acute post-op pain. However, in this case, you are responsible for picking up a copy of our "Post-op Pain Management for Surgeons" handout, and giving it to your surgeon or dentist. This  document is available at our office, and does not require an appointment to obtain it. Simply go to our office during business hours (Monday-Thursday from 8:00 AM to 4:00 PM) (Friday 8:00 AM to 12:00 Noon) or if you have a scheduled appointment with us, prior to your surgery, and ask for it by name. In addition, you are responsible for: calling our office [phone], at any time of the day or night, and leaving a message stating your name, name of your surgeon, type of surgery, and date of procedure or surgery. Failure to comply with your responsibilities may result in termination of therapy involving the controlled substances.  *Opioid medications include: morphine, codeine, oxycodone, oxymorphone, hydrocodone, hydromorphone, meperidine, tramadol, tapentadol, buprenorphine, fentanyl, methadone. **Benzodiazepine medications include: diazepam (Valium), alprazolam (Xanax), clonazepam (Klonopine), lorazepam (Ativan), clorazepate (Tranxene), chlordiazepoxide (Librium), estazolam (Prosom), oxazepam (Serax), temazepam (Restoril), triazolam (Halcion) (Last updated: 07/10/2020) ____________________________________________________________________________________________   ____________________________________________________________________________________________  Medication Recommendations and Reminders  Applies to: All patients receiving prescriptions (written and/or electronic).  Medication Rules & Regulations: These rules and regulations exist for your safety and that of others. They are not flexible and neither are we. Dismissing or ignoring them will be considered "non-compliance" with medication therapy, resulting in complete and irreversible termination of such therapy. (See document titled "Medication Rules" for more details.) In all conscience, because of safety reasons, we cannot continue providing a therapy where the patient does not follow instructions.  Pharmacy of record:   Definition:  This is the pharmacy where your electronic prescriptions will be sent.   We do not endorse any particular pharmacy, however, we have experienced problems with [facility] not securing enough medication supply for the community.  We do not restrict you in your choice of pharmacy. However,   once we write for your prescriptions, we will NOT be re-sending more prescriptions to fix restricted supply problems created by your pharmacy, or your insurance.   The pharmacy listed in the electronic medical record should be the one where you want electronic prescriptions to be sent.  If you choose to change pharmacy, simply notify our nursing staff.  Recommendations:  Keep all of your pain medications in a safe place, under lock and key, even if you live alone. We will NOT replace lost, stolen, or damaged medication.  After you fill your prescription, take 1 week's worth of pills and put them away in a safe place. You should keep a separate, properly labeled bottle for this purpose. The remainder should be kept in the original bottle. Use this as your primary supply, until it runs out. Once it's gone, then you know that you have 1 week's worth of medicine, and it is time to come in for a prescription refill. If you do this correctly, it is unlikely that you will ever run out of medicine.  To make sure that the above recommendation works, it is very important that you make sure your medication refill appointments are scheduled at least 1 week before you run out of medicine. To do this in an effective manner, make sure that you do not leave the office without scheduling your next medication management appointment. Always ask the nursing staff to show you in your prescription , when your medication will be running out. Then arrange for the receptionist to get you a return appointment, at least 7 days before you run out of medicine. Do not wait until you have 1 or 2 pills left, to come in. This is very poor planning and  does not take into consideration that we may need to cancel appointments due to bad weather, sickness, or emergencies affecting our staff.  DO NOT ACCEPT A "Partial Fill": If for any reason your pharmacy does not have enough pills/tablets to completely fill or refill your prescription, do not allow for a "partial fill". The law allows the pharmacy to complete that prescription within 72 hours, without requiring a new prescription. If they do not fill the rest of your prescription within those 72 hours, you will need a separate prescription to fill the remaining amount, which we will NOT provide. If the reason for the partial fill is your insurance, you will need to talk to the pharmacist about payment alternatives for the remaining tablets, but again, DO NOT ACCEPT A PARTIAL FILL, unless you can trust your pharmacist to obtain the remainder of the pills within 72 hours.  Prescription refills and/or changes in medication(s):   Prescription refills, and/or changes in dose or medication, will be conducted only during scheduled medication management appointments. (Applies to both, written and electronic prescriptions.)  No refills on procedure days. No medication will be changed or started on procedure days. No changes, adjustments, and/or refills will be conducted on a procedure day. Doing so will interfere with the diagnostic portion of the procedure.  No phone refills. No medications will be "called into the pharmacy".  No Fax refills.  No weekend refills.  No Holliday refills.  No after hours refills.  Remember:  Business hours are:  Monday to Thursday 8:00 AM to 4:00 PM Provider's Schedule: Saralynn Langhorst, MD - Appointments are:  Medication management: Monday and Wednesday 8:00 AM to 4:00 PM Procedure day: Tuesday and Thursday 7:30 AM to 4:00 PM Bilal Lateef, MD - Appointments are:    Medication management: Tuesday and Thursday 8:00 AM to 4:00 PM Procedure day: Monday and Wednesday  7:30 AM to 4:00 PM (Last update: 03/01/2020) ____________________________________________________________________________________________   ____________________________________________________________________________________________  Drug Holidays (Slow)  What is a "Drug Holiday"? Drug Holiday: is the name given to the period of time during which a patient stops taking a medication(s) for the purpose of eliminating tolerance to the drug.  Benefits . Improved effectiveness of opioids. . Decreased opioid dose needed to achieve benefits. . Improved pain with lesser dose.  What is tolerance? Tolerance: is the progressive decreased in effectiveness of a drug due to its repetitive use. With repetitive use, the body gets use to the medication and as a consequence, it loses its effectiveness. This is a common problem seen with opioid pain medications. As a result, a larger dose of the drug is needed to achieve the same effect that used to be obtained with a smaller dose.  How long should a "Drug Holiday" last? You should stay off of the pain medicine for at least 14 consecutive days. (2 weeks)  Should I stop the medicine "cold [place]"? No. You should always coordinate with your Pain Specialist so that he/she can provide you with the correct medication dose to make the transition as smoothly as possible.  How do I stop the medicine? Slowly. You will be instructed to decrease the daily amount of pills that you take by one (1) pill every seven (7) days. This is called a "slow downward taper" of your dose. For example: if you normally take four (4) pills per day, you will be asked to drop this dose to three (3) pills per day for seven (7) days, then to two (2) pills per day for seven (7) days, then to one (1) per day for seven (7) days, and at the end of those last seven (7) days, this is when the "Drug Holiday" would start.   Will I have withdrawals? By doing a "slow downward taper" like this one,  it is unlikely that you will experience any significant withdrawal symptoms. Typically, what triggers withdrawals is the sudden stop of a high dose opioid therapy. Withdrawals can usually be avoided by slowly decreasing the dose over a prolonged period of time. If you do not follow these instructions and decide to stop your medication abruptly, withdrawals may be possible.  What are withdrawals? Withdrawals: refers to the wide range of symptoms that occur after stopping or dramatically reducing opiate drugs after heavy and prolonged use. Withdrawal symptoms do not occur to patients that use low dose opioids, or those who take the medication sporadically. Contrary to benzodiazepine (example: Valium, Xanax, etc.) or alcohol withdrawals ("Delirium Tremens"), opioid withdrawals are not lethal. Withdrawals are the physical manifestation of the body getting rid of the excess receptors.  Expected Symptoms Early symptoms of withdrawal may include: . Agitation . Anxiety . Muscle aches . Increased tearing . Insomnia . Runny nose . Sweating . Yawning  Late symptoms of withdrawal may include: . Abdominal cramping . Diarrhea . Dilated pupils . Goose bumps . Nausea . Vomiting  Will I experience withdrawals? Due to the slow nature of the taper, it is very unlikely that you will experience any.  What is a slow taper? Taper: refers to the gradual decrease in dose.  (Last update: 03/01/2020) ____________________________________________________________________________________________    ____________________________________________________________________________________________  CBD (cannabidiol) WARNING  Applicable to: All individuals currently taking or considering taking CBD (cannabidiol) and, more important, all patients taking opioid analgesic controlled substances (pain  medication). (Example: oxycodone; oxymorphone; hydrocodone; hydromorphone; morphine; methadone; tramadol; tapentadol;  fentanyl; buprenorphine; butorphanol; dextromethorphan; meperidine; codeine; etc.)  Legal status: CBD remains a Schedule I drug prohibited for any use. CBD is illegal with one exception. In the [place], CBD has a limited Food and Drug Administration (FDA) approval for the treatment of two specific types of epilepsy disorders. Only one CBD product has been approved by the FDA for this purpose: "Epidiolex". FDA is aware that some companies are marketing products containing cannabis and cannabis-derived compounds in ways that violate the Federal Food, Drug and Cosmetic Act (FD&C Act) and that may put the health and safety of consumers at risk. The FDA, a Federal agency, has not enforced the CBD status since 2018.   Legality: Some manufacturers ship CBD products nationally, which is illegal. Often such products are sold online and are therefore available throughout the country. CBD is openly sold in head shops and health food stores in some states where such sales have not been explicitly legalized. Selling unapproved products with unsubstantiated therapeutic claims is not only a violation of the law, but also can put patients at risk, as these products have not been proven to be safe or effective. Federal illegality makes it difficult to conduct research on CBD.  Reference: "FDA Regulation of Cannabis and Cannabis-Derived Products, Including Cannabidiol (CBD)" - https://www.fda.gov/news-events/public-health-focus/fda-regulation-cannabis-and-cannabis-derived-products-including-cannabidiol-cbd  Warning: CBD is not FDA approved and has not undergo the same manufacturing controls as prescription drugs.  This means that the purity and safety of available CBD may be questionable. Most of the time, despite manufacturer's claims, it is contaminated with THC (delta-9-tetrahydrocannabinol - the chemical in marijuana responsible for the "HIGH").  When this is the case, the THC contaminant will trigger a positive  urine drug screen (UDS) test for Marijuana (carboxy-THC). Because a positive UDS for any illicit substance is a violation of our medication agreement, your opioid analgesics (pain medicine) may be permanently discontinued.  MORE ABOUT CBD  General Information: CBD  is a derivative of the Marijuana (cannabis sativa) plant discovered in 1940. It is one of the 113 identified substances found in Marijuana. It accounts for up to 40% of the plant's extract. As of 2018, preliminary clinical studies on CBD included research for the treatment of anxiety, movement disorders, and pain. CBD is available and consumed in multiple forms, including inhalation of smoke or vapor, as an aerosol spray, and by mouth. It may be supplied as an oil containing CBD, capsules, dried cannabis, or as a liquid solution. CBD is thought not to be as psychoactive as THC (delta-9-tetrahydrocannabinol - the chemical in marijuana responsible for the "HIGH"). Studies suggest that CBD may interact with different biological target receptors in the body, including cannabinoid and other neurotransmitter receptors. As of 2018 the mechanism of action for its biological effects has not been determined.  Side-effects  Adverse reactions: Dry mouth, diarrhea, decreased appetite, fatigue, drowsiness, malaise, weakness, sleep disturbances, and others.  Drug interactions: CBC may interact with other medications such as blood-thinners. (Last update: 03/18/2020) ____________________________________________________________________________________________    

## 2020-09-04 NOTE — Progress Notes (Signed)
Nursing Pain Medication Assessment:  Safety precautions to be maintained throughout the outpatient stay will include: orient to surroundings, keep bed in low position, maintain call bell within reach at all times, provide assistance with transfer out of bed and ambulation.  Medication Inspection Compliance: Pill count conducted under aseptic conditions, in front of the patient. Neither the pills nor the bottle was removed from the patient's sight at any time. Once count was completed pills were immediately returned to the patient in their original bottle.  Medication: Morphine IR Pill/Patch Count: 13 of 60 pills remain Pill/Patch Appearance: Markings consistent with prescribed medication Bottle Appearance: Standard pharmacy container. Clearly labeled. Filled Date: 1 / 1 / 22 Last Medication intake:  TodaySafety precautions to be maintained throughout the outpatient stay will include: orient to surroundings, keep bed in low position, maintain call bell within reach at all times, provide assistance with transfer out of bed and ambulation.

## 2020-09-05 DIAGNOSIS — R490 Dysphonia: Secondary | ICD-10-CM | POA: Diagnosis not present

## 2020-09-05 DIAGNOSIS — R053 Chronic cough: Secondary | ICD-10-CM | POA: Diagnosis not present

## 2020-09-05 DIAGNOSIS — J387 Other diseases of larynx: Secondary | ICD-10-CM | POA: Diagnosis not present

## 2020-09-12 DIAGNOSIS — M79642 Pain in left hand: Secondary | ICD-10-CM | POA: Diagnosis not present

## 2020-09-12 DIAGNOSIS — M25531 Pain in right wrist: Secondary | ICD-10-CM | POA: Diagnosis not present

## 2020-09-12 DIAGNOSIS — M25532 Pain in left wrist: Secondary | ICD-10-CM | POA: Diagnosis not present

## 2020-09-18 DIAGNOSIS — J479 Bronchiectasis, uncomplicated: Secondary | ICD-10-CM | POA: Diagnosis not present

## 2020-09-18 DIAGNOSIS — M150 Primary generalized (osteo)arthritis: Secondary | ICD-10-CM | POA: Diagnosis not present

## 2020-09-18 DIAGNOSIS — M810 Age-related osteoporosis without current pathological fracture: Secondary | ICD-10-CM | POA: Diagnosis not present

## 2020-09-18 DIAGNOSIS — G8929 Other chronic pain: Secondary | ICD-10-CM | POA: Diagnosis not present

## 2020-09-18 DIAGNOSIS — D508 Other iron deficiency anemias: Secondary | ICD-10-CM | POA: Diagnosis not present

## 2020-09-18 DIAGNOSIS — I10 Essential (primary) hypertension: Secondary | ICD-10-CM | POA: Diagnosis not present

## 2020-09-18 DIAGNOSIS — K219 Gastro-esophageal reflux disease without esophagitis: Secondary | ICD-10-CM | POA: Diagnosis not present

## 2020-09-27 ENCOUNTER — Other Ambulatory Visit: Payer: Self-pay | Admitting: Internal Medicine

## 2020-09-27 DIAGNOSIS — R053 Chronic cough: Secondary | ICD-10-CM

## 2020-09-27 NOTE — Telephone Encounter (Signed)
[facility] pt is requesting a refill of the tessalon perles and the promethazine cough meds.    Last OV was 07/07/20 and was told to follow up as needed so no pending appts with you.  She has been following Dr. Joya Gaskins at [facility].  Please advise on refills.  Thanks

## 2020-09-28 MED ORDER — BENZONATATE 200 MG PO CAPS: 200.0000 mg | ORAL_CAPSULE | Freq: Three times a day (TID) | ORAL | 0 refills | Status: DC | PRN

## 2020-09-28 MED ORDER — PROMETHAZINE-DM 6.25-15 MG/5ML PO SYRP: 5.0000 mL | ORAL_SOLUTION | Freq: Four times a day (QID) | ORAL | 0 refills | Status: DC | PRN

## 2020-09-28 NOTE — Telephone Encounter (Signed)
lmtcb for pt  Tessalon last filled for #90 and Promethazine DM filled for 473 ml.   Dr. Melvyn Novas please advise if these amounts are ok to refill. Thanks.

## 2020-09-28 NOTE — Telephone Encounter (Signed)
Ok for one more refill then either let Dr Marney Setting direct her cough med regimen or return here to regroup with all active meds in hand so we're sure to coordinate her care more effectively.

## 2020-10-12 DIAGNOSIS — J387 Other diseases of larynx: Secondary | ICD-10-CM | POA: Diagnosis not present

## 2020-10-12 DIAGNOSIS — R490 Dysphonia: Secondary | ICD-10-CM | POA: Diagnosis not present

## 2020-10-12 DIAGNOSIS — R053 Chronic cough: Secondary | ICD-10-CM | POA: Diagnosis not present

## 2020-10-26 DIAGNOSIS — R490 Dysphonia: Secondary | ICD-10-CM | POA: Diagnosis not present

## 2020-10-26 DIAGNOSIS — J387 Other diseases of larynx: Secondary | ICD-10-CM | POA: Diagnosis not present

## 2020-10-26 DIAGNOSIS — R053 Chronic cough: Secondary | ICD-10-CM | POA: Diagnosis not present

## 2020-10-27 DIAGNOSIS — J479 Bronchiectasis, uncomplicated: Secondary | ICD-10-CM | POA: Diagnosis not present

## 2020-10-27 DIAGNOSIS — K219 Gastro-esophageal reflux disease without esophagitis: Secondary | ICD-10-CM | POA: Diagnosis not present

## 2020-10-27 DIAGNOSIS — D508 Other iron deficiency anemias: Secondary | ICD-10-CM | POA: Diagnosis not present

## 2020-10-27 DIAGNOSIS — G8929 Other chronic pain: Secondary | ICD-10-CM | POA: Diagnosis not present

## 2020-10-27 DIAGNOSIS — M810 Age-related osteoporosis without current pathological fracture: Secondary | ICD-10-CM | POA: Diagnosis not present

## 2020-10-27 DIAGNOSIS — I10 Essential (primary) hypertension: Secondary | ICD-10-CM | POA: Diagnosis not present

## 2020-10-27 DIAGNOSIS — M150 Primary generalized (osteo)arthritis: Secondary | ICD-10-CM | POA: Diagnosis not present

## 2020-11-02 DIAGNOSIS — Z1231 Encounter for screening mammogram for malignant neoplasm of breast: Secondary | ICD-10-CM | POA: Diagnosis not present

## 2020-11-09 DIAGNOSIS — J387 Other diseases of larynx: Secondary | ICD-10-CM | POA: Diagnosis not present

## 2020-11-09 DIAGNOSIS — R490 Dysphonia: Secondary | ICD-10-CM | POA: Diagnosis not present

## 2020-11-09 DIAGNOSIS — R053 Chronic cough: Secondary | ICD-10-CM | POA: Diagnosis not present

## 2020-11-16 ENCOUNTER — Encounter: Payer: Self-pay | Admitting: Pain Medicine

## 2020-12-01 DIAGNOSIS — J209 Acute bronchitis, unspecified: Secondary | ICD-10-CM | POA: Diagnosis not present

## 2020-12-01 DIAGNOSIS — J069 Acute upper respiratory infection, unspecified: Secondary | ICD-10-CM | POA: Diagnosis not present

## 2020-12-01 DIAGNOSIS — L989 Disorder of the skin and subcutaneous tissue, unspecified: Secondary | ICD-10-CM | POA: Diagnosis not present

## 2020-12-03 DIAGNOSIS — Z20822 Contact with and (suspected) exposure to covid-19: Secondary | ICD-10-CM | POA: Diagnosis not present

## 2020-12-03 DIAGNOSIS — R051 Acute cough: Secondary | ICD-10-CM | POA: Diagnosis not present

## 2020-12-04 ENCOUNTER — Encounter: Payer: Medicare Other | Admitting: Pain Medicine

## 2020-12-05 NOTE — Progress Notes (Signed)
PROVIDER NOTE: Information contained herein reflects review and annotations entered in association with encounter. Interpretation of such information and data should be left to medically-trained personnel. Information provided to patient can be located elsewhere in the medical record under "Patient Instructions". Document created using STT-dictation technology, any transcriptional errors that may result from process are unintentional.    Patient: Kari Gibbs  Service Category: E/M  Provider: Gaspar Cola, MD  DOB: [birth year]  DOS: 12/06/2020  Specialty: Interventional Pain Management  MRN: [id number]  Setting: Ambulatory outpatient  PCP: Cari Caraway, MD  Type: Established Patient    Referring Provider: Cari Caraway, MD  Location: Office  Delivery: Face-to-face     HPI  Kari Gibbs, a 70 y.o. year old female, is here today because of her Chronic pain syndrome [G89.4]. Kari Gibbs primary complain today is Abdominal Pain (Left upper quadrant around to mid back) Last encounter: My last encounter with her was on 09/04/2020. Pertinent problems: Kari Gibbs has Fracture of rib (4th and 8th rib) (Left); Chronic pain syndrome; Chronic flank pain (Primary Area of Pain) (Left); Chronic thoracic radicular pain (Left-sided); Chronic occipital neuralgia (Left); Neuropathic pain; Neurogenic pain; History of rib fracture (4th and 8th rib) (Left); Vertebral body hemangioma (T11); Metatarsalgia of foot (Right); Chronic abdominal pain (Bilateral) (L>R) (possible left-sided thoracic radiculopathy); Primary osteoarthritis; Chronic neck pain (Bilateral) (L>R); Cervical facet syndrome (Bilateral) (L>R); Muscle spasticity; Tremors of nervous system; Muscle twitching; Cervical spondylosis; Chronic low back pain (Bilateral) w/o sciatica; Acute costochondritis (Left); Chronic low back pain (Bilateral) w/ sciatica (Bilateral); Chronic sacroiliac joint pain (Bilateral); Other specified dorsopathies, sacral  and sacrococcygeal region; Chronic sacroiliac joint somatic dysfunction (Bilateral); and Chronic postoperative pain on their pertinent problem list. Pain Assessment: Severity of Chronic pain is reported as a 3 /10. Location: Abdomen Left,Upper/mid back on the left. Onset: More than a month ago. Quality:  (Deep , gripping throb). Timing: Intermittent. Modifying factor(s): medications, heat, positioning. Vitals:  height is $RemoveB'5\' 6"'OJgPkqbe$  (1.676 m) and weight is 219 lb (99.3 kg). Her temporal temperature is 97.7 F (36.5 C). Her blood pressure is 127/60 and her pulse is 87. Her respiration is 18 and oxygen saturation is 97%.   Reason for encounter: medication management.   The patient indicates doing well with the current medication regimen. No adverse reactions or side effects reported to the medications.  I took the opportunity to explain the concept of tolerance and how "Drug Holidays" help control it.  I detailed how to do a slow taper to avoid withdrawals.   RTCB: 03/10/2021 Nonopioids transferred 05/29/2020: Gabapentin and Voltaren gel  Pharmacotherapy Assessment   Analgesic: MS Contin 30 mg, 1 tab PO q 12 hrs (60 mg/day of morphine) MME/day:60 mg/day.   Monitoring: [place] PMP: PDMP reviewed during this encounter.       Pharmacotherapy: No side-effects or adverse reactions reported. Compliance: No problems identified. Effectiveness: Clinically acceptable.  Hart Rochester, RN  12/06/2020 11:30 AM  Sign when Signing Visit Nursing Pain Medication Assessment:  Safety precautions to be maintained throughout the outpatient stay will include: orient to surroundings, keep bed in low position, maintain call bell within reach at all times, provide assistance with transfer out of bed and ambulation.  Medication Inspection Compliance: Pill count conducted under aseptic conditions, in front of the patient. Neither the pills nor the bottle was removed from the patient's sight at any time. Once count was  completed pills were immediately returned to the patient in their original bottle.  Medication: Morphine  ER (MSContin) Pill/Patch Count: 7 of 60 pills remain Pill/Patch Appearance: Markings consistent with prescribed medication Bottle Appearance: Standard pharmacy container. Clearly labeled. Filled Date: 04 / 01 / 2022 Last Medication intake:  Today    UDS:  Summary  Date Value Ref Range Status  12/09/2019 Note  Final    Comment:    ==================================================================== [employer] Select 13 (MW) ==================================================================== Test                             Result       Flag       Units Drug Present and Declared for Prescription Verification   Morphine                       >7937        EXPECTED   ng/mg creat   Normorphine                    913          EXPECTED   ng/mg creat    Potential sources of large amounts of morphine in the absence of    codeine include administration of morphine or use of heroin.    Normorphine is an expected metabolite of morphine.   Hydromorphone                  309          EXPECTED   ng/mg creat    Hydromorphone may be present as a metabolite of morphine;    concentrations of hydromorphone rarely exceed 5% of the morphine    concentration when this is the source of hydromorphone. ==================================================================== Test                      Result    Flag   Units      Ref Range   Creatinine              126              mg/dL      >=20 ==================================================================== Declared Medications:  The flagging and interpretation on this report are based on the  following declared medications.  Unexpected results may arise from  inaccuracies in the declared medications.  **Note: The testing scope of this panel includes these medications:  Morphine  **Note: The testing scope of this panel does not include the  following  reported medications:  Azelastine  Benzonatate  Buspirone  Cholecalciferol  Cranberry  Cyanocobalamin  Dextromethorphan  Esomeprazole (Nexium)  Gabapentin  Irbesartan  Iron  Loratadine  Magnesium  Multivitamin  Naloxone  Nifedipine  Probiotic  Promethazine  Topical Diclofenac  Venlafaxine  Vitamin C ==================================================================== For clinical consultation, please call [phone]. ====================================================================      ROS  Constitutional: Denies any fever or chills Gastrointestinal: No reported hemesis, hematochezia, vomiting, or acute GI distress Musculoskeletal: Denies any acute onset joint swelling, redness, loss of ROM, or weakness Neurological: No reported episodes of acute onset apraxia, aphasia, dysarthria, agnosia, amnesia, paralysis, loss of coordination, or loss of consciousness  Medication Review  Biotin, Cranberry, Flutter, Iron-Vitamin C, LORazepam, Magnesium, Peppermint Oil, Prenatal MV & Min w/FA-DHA, RA Probiotic Gummies, Vitamin D3, acetaminophen, azelastine, benzonatate, busPIRone, conjugated estrogens, diclofenac Sodium, esomeprazole, gabapentin, ibuprofen, irbesartan, loratadine, montelukast, morphine, naloxone, potassium gluconate, and promethazine-dextromethorphan  History Review  Allergy: Kari Gibbs is allergic to iohexol, ace inhibitors, losartan  potassium, and neosporin [neomycin-bacitracin zn-polymyx]. Drug: Kari Gibbs  reports no history of drug use. Alcohol:  reports previous alcohol use. Tobacco:  reports that she has never smoked. She has never used smokeless tobacco. Social: Kari Gibbs  reports that she has never smoked. She has never used smokeless tobacco. She reports previous alcohol use. She reports that she does not use drugs. 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 Surgical: Kari Gibbs  has a past surgical history that includes Gastric bypass (11/13/2009); Exploratory laparotomy (2012); TMJ- LEFT--ABOUT 10 YRS AGO--LEFT SIDE--PT NOW HAS SOME PAIN LEFT JAW (Left); laparoscopy (03/04/2012); Hernia repair (2011); Abdominal hysterectomy (1988); Cholecystectomy (N/A, 05/18/2013); Shoulder surgery (Right, 06/16/2017); Endovenous ablation saphenous vein w/ laser (Left, 02/04/2018); Esophageal manometry (N/A, 04/05/2019); Video bronchoscopy (Bilateral, 07/14/2019); and Open reduction internal fixation (orif) distal radial fracture (Right, 04/10/2020). Family: family history includes Alzheimer's disease in her father; Hypertension in her mother.  Laboratory Chemistry Profile   Renal Lab Results  Component Value Date   BUN 20 01/04/2016   CREATININE 0.76 01/04/2016   GFRAA >60 01/04/2016   GFRNONAA >60 01/04/2016     Hepatic Lab Results  Component Value Date   AST 33 01/04/2016   ALT 26 01/04/2016   ALBUMIN 4.2 01/04/2016   ALKPHOS 97 01/04/2016   AMYLASE 30 02/27/2010   LIPASE 22 10/12/2012     Electrolytes Lab Results  Component Value Date   NA 139 01/04/2016   K 4.6 01/04/2016   CL 99 (L) 01/04/2016   CALCIUM 9.7 01/04/2016   MG 2.1 01/04/2016     Bone Lab Results  Component Value Date   25OHVITD1 49 01/04/2016   25OHVITD2 25 01/04/2016   25OHVITD3 24 01/04/2016     Inflammation (CRP: Acute Phase) (ESR: Chronic Phase) Lab Results  Component Value Date   CRP <0.5 01/04/2016   ESRSEDRATE 17 01/04/2016       Note: Above Lab results reviewed.  Recent Imaging Review  DG  C-Arm 1-60 Min CLINICAL DATA:  ORIF RIGHT wrist  EXAM: RIGHT WRIST - COMPLETE 3+ VIEW; DG C-ARM 1-60 MIN  COMPARISON:  04/05/2020  FLUOROSCOPY TIME:  0 minutes 30 seconds  Dose: 0.84 mGy  Images: 3  FINDINGS: Dorsal plate and multiple screws identified at the distal RIGHT radius post ORIF of previously seen distal radial metaphyseal fracture.  Displacement of fragments and dorsal tilt of distal radial articular surface appear improved since the previous exam.  Diffuse osseous demineralization.  No additional acute osseous findings.  IMPRESSION: Post ORIF distal RIGHT radius.  Electronically Signed   By: Ulyses Southward M.D.   On: 04/10/2020 15:25 DG Wrist Complete Right CLINICAL DATA:  ORIF RIGHT wrist  EXAM: RIGHT WRIST - COMPLETE 3+ VIEW; DG C-ARM 1-60 MIN  COMPARISON:  04/05/2020  FLUOROSCOPY TIME:  0 minutes 30 seconds  Dose: 0.84 mGy  Images: 3  FINDINGS: Dorsal plate and multiple screws identified at the distal RIGHT radius post ORIF of previously seen distal radial metaphyseal fracture.  Displacement of fragments and dorsal tilt of distal radial articular surface appear improved since the previous exam.  Diffuse osseous demineralization.  No additional acute osseous findings.  IMPRESSION: Post ORIF distal RIGHT radius.  Electronically Signed   By: Lavonia Dana M.D.   On: 04/10/2020 15:25 Note: Reviewed        Physical Exam  General appearance: Well nourished, well developed, and well hydrated. In no apparent acute distress Mental status: Alert, oriented x 3 (person, place, & time)       Respiratory: No evidence of acute respiratory distress Eyes: PERLA Vitals: BP 127/60   Pulse 87   Temp 97.7 F (36.5 C) (Temporal)   Resp 18   Ht $R'5\' 6"'Xl$  (1.676 m)   Wt 219 lb (99.3 kg)   SpO2 97%   BMI 35.35 kg/m  BMI: Estimated body mass index is 35.35 kg/m as calculated from the following:   Height as of this encounter: $RemoveBeforeD'5\' 6"'fTrrUXGdvaUzKW$  (1.676 m).   Weight as  of this encounter: 219 lb (99.3 kg). Ideal: Ideal body weight: 59.3 kg (130 lb 11.7 oz) Adjusted ideal body weight: 75.3 kg (166 lb 0.6 oz)  Assessment   Status Diagnosis  Controlled Controlled Controlled 1. Chronic pain syndrome   2. Chronic flank pain (Primary Area of Pain) (Left)   3. Chronic low back pain (Bilateral) w/ sciatica (Bilateral)   4. Chronic thoracic radicular pain (Left-sided)   5. History of rib fracture (4th and 8th rib) (Left)   6. Pharmacologic therapy   7. Chronic use of opiate for therapeutic purpose   8. Uncomplicated opioid dependence ([place])      Updated Problems: Problem  Chronic Postoperative Pain  Chronic Use of Opiate for Therapeutic Purpose    Plan of Care  Problem-specific:  No problem-specific Assessment & Plan notes found for this encounter.  Kari Gibbs has a current medication list which includes the following long-term medication(s): diclofenac sodium, gabapentin, gabapentin, loratadine, [START ON 12/10/2020] morphine, [START ON 01/09/2021] morphine, and [START ON 02/08/2021] morphine.  Pharmacotherapy (Medications Ordered): Meds ordered this encounter  Medications  . morphine (MS CONTIN) 30 MG 12 hr tablet    Sig: Take 1 tablet (30 mg total) by mouth every 12 (twelve) hours. Must last 30 days    Dispense:  60 tablet    Refill:  0    Not a duplicate. Do NOT delete! Dispense 1 day early if closed on refill date. Avoid benzodiazepines within 8 hours of opioids. Do not send refill requests.  Marland Kitchen morphine (MS CONTIN) 30 MG 12 hr tablet    Sig: Take 1 tablet (30 mg total) by mouth every 12 (twelve) hours. Must last 30 days    Dispense:  60 tablet    Refill:  0    Not a duplicate. Do NOT delete! Dispense 1 day early if closed on refill date. Avoid benzodiazepines within 8 hours of opioids. Do not send refill requests.  Marland Kitchen morphine (MS CONTIN) 30 MG 12 hr tablet    Sig: Take 1 tablet (30 mg total) by mouth every 12 (twelve) hours. Must last 30  days    Dispense:  60 tablet    Refill:  0    Not a duplicate. Do NOT delete! Dispense 1 day early if closed on refill date. Avoid benzodiazepines within 8 hours of opioids. Do not send refill requests.   Orders:  No orders of the defined types were placed in this encounter.  Follow-up plan:   Return in about 3 months (around 03/10/2021) for (F2F), (MM).      Interventional management options:  Considering:   Diagnosticleft costochondral joint injection  Possible left  intercostal nerve RFA  Therapeutic Left greater occipital nerve RFA Diagnostic left C2 + TON nerve block Possible left C2 + TON RFA    Palliative PRN treatment(s):   Palliative left intercostal nerve blocks (T5 to T8) #2  Palliative left GONB block #3  Palliative bilateral sacroiliac joint block #2      Recent Visits No visits were found meeting these conditions. Showing recent visits within past 90 days and meeting all other requirements Today's Visits Date Type Provider Dept  12/06/20 Office Visit [name], MD [facility]  Showing today's visits and meeting all other requirements Future Appointments No visits were found meeting these conditions. Showing future appointments within next 90 days and meeting all other requirements  I discussed the assessment and treatment plan with the patient. The patient was provided an opportunity to ask questions and all were answered. The patient agreed with the plan and demonstrated an understanding of the instructions.  Patient advised to call back or seek an in-person evaluation if the symptoms or condition worsens.  Duration of encounter: 21 minutes.  Note by: Gaspar Cola, MD Date: 12/06/2020; Time: 12:59 PM

## 2020-12-06 ENCOUNTER — Other Ambulatory Visit: Payer: Self-pay

## 2020-12-06 ENCOUNTER — Encounter: Payer: Self-pay | Admitting: Pain Medicine

## 2020-12-06 ENCOUNTER — Ambulatory Visit: Payer: Medicare Other | Attending: Pain Medicine | Admitting: Pain Medicine

## 2020-12-06 VITALS — BP 127/60 | HR 87 | Temp 97.7°F | Resp 18 | Ht 66.0 in | Wt 219.0 lb

## 2020-12-06 DIAGNOSIS — Z8781 Personal history of (healed) traumatic fracture: Secondary | ICD-10-CM

## 2020-12-06 DIAGNOSIS — R109 Unspecified abdominal pain: Secondary | ICD-10-CM

## 2020-12-06 DIAGNOSIS — M5414 Radiculopathy, thoracic region: Secondary | ICD-10-CM | POA: Diagnosis not present

## 2020-12-06 DIAGNOSIS — M5441 Lumbago with sciatica, right side: Secondary | ICD-10-CM

## 2020-12-06 DIAGNOSIS — G894 Chronic pain syndrome: Secondary | ICD-10-CM | POA: Diagnosis not present

## 2020-12-06 DIAGNOSIS — G8929 Other chronic pain: Secondary | ICD-10-CM | POA: Diagnosis not present

## 2020-12-06 DIAGNOSIS — Z79899 Other long term (current) drug therapy: Secondary | ICD-10-CM | POA: Insufficient documentation

## 2020-12-06 DIAGNOSIS — Z79891 Long term (current) use of opiate analgesic: Secondary | ICD-10-CM | POA: Insufficient documentation

## 2020-12-06 DIAGNOSIS — M5442 Lumbago with sciatica, left side: Secondary | ICD-10-CM | POA: Insufficient documentation

## 2020-12-06 DIAGNOSIS — F1120 Opioid dependence, uncomplicated: Secondary | ICD-10-CM | POA: Diagnosis present

## 2020-12-06 MED ORDER — MORPHINE SULFATE ER 30 MG PO TBCR: 30.0000 mg | EXTENDED_RELEASE_TABLET | Freq: Two times a day (BID) | ORAL | 0 refills | Status: DC

## 2020-12-06 NOTE — Progress Notes (Signed)
Nursing Pain Medication Assessment:  Safety precautions to be maintained throughout the outpatient stay will include: orient to surroundings, keep bed in low position, maintain call bell within reach at all times, provide assistance with transfer out of bed and ambulation.  Medication Inspection Compliance: Pill count conducted under aseptic conditions, in front of the patient. Neither the pills nor the bottle was removed from the patient's sight at any time. Once count was completed pills were immediately returned to the patient in their original bottle.  Medication: Morphine ER (MSContin) Pill/Patch Count: 7 of 60 pills remain Pill/Patch Appearance: Markings consistent with prescribed medication Bottle Appearance: Standard pharmacy container. Clearly labeled. Filled Date: 04 / 01 / 2022 Last Medication intake:  Today

## 2020-12-06 NOTE — Patient Instructions (Signed)
____________________________________________________________________________________________  Drug Holidays (Slow)  What is a "Drug Holiday"? Drug Holiday: is the name given to the period of time during which a patient stops taking a medication(s) for the purpose of eliminating tolerance to the drug.  Benefits . Improved effectiveness of opioids. . Decreased opioid dose needed to achieve benefits. . Improved pain with lesser dose.  What is tolerance? Tolerance: is the progressive decreased in effectiveness of a drug due to its repetitive use. With repetitive use, the body gets use to the medication and as a consequence, it loses its effectiveness. This is a common problem seen with opioid pain medications. As a result, a larger dose of the drug is needed to achieve the same effect that used to be obtained with a smaller dose.  How long should a "Drug Holiday" last? You should stay off of the pain medicine for at least 14 consecutive days. (2 weeks)  Should I stop the medicine "cold turkey"? No. You should always coordinate with your Pain Specialist so that he/she can provide you with the correct medication dose to make the transition as smoothly as possible.  How do I stop the medicine? Slowly. You will be instructed to decrease the daily amount of pills that you take by one (1) pill every seven (7) days. This is called a "slow downward taper" of your dose. For example: if you normally take four (4) pills per day, you will be asked to drop this dose to three (3) pills per day for seven (7) days, then to two (2) pills per day for seven (7) days, then to one (1) per day for seven (7) days, and at the end of those last seven (7) days, this is when the "Drug Holiday" would start.   Will I have withdrawals? By doing a "slow downward taper" like this one, it is unlikely that you will experience any significant withdrawal symptoms. Typically, what triggers withdrawals is the sudden stop of a high  dose opioid therapy. Withdrawals can usually be avoided by slowly decreasing the dose over a prolonged period of time. If you do not follow these instructions and decide to stop your medication abruptly, withdrawals may be possible.  What are withdrawals? Withdrawals: refers to the wide range of symptoms that occur after stopping or dramatically reducing opiate drugs after heavy and prolonged use. Withdrawal symptoms do not occur to patients that use low dose opioids, or those who take the medication sporadically. Contrary to benzodiazepine (example: Valium, Xanax, etc.) or alcohol withdrawals ("Delirium Tremens"), opioid withdrawals are not lethal. Withdrawals are the physical manifestation of the body getting rid of the excess receptors.  Expected Symptoms Early symptoms of withdrawal may include: . Agitation . Anxiety . Muscle aches . Increased tearing . Insomnia . Runny nose . Sweating . Yawning  Late symptoms of withdrawal may include: . Abdominal cramping . Diarrhea . Dilated pupils . Goose bumps . Nausea . Vomiting  Will I experience withdrawals? Due to the slow nature of the taper, it is very unlikely that you will experience any.  What is a slow taper? Taper: refers to the gradual decrease in dose.  (Last update: 03/01/2020) ____________________________________________________________________________________________     

## 2020-12-20 DIAGNOSIS — D508 Other iron deficiency anemias: Secondary | ICD-10-CM | POA: Diagnosis not present

## 2020-12-20 DIAGNOSIS — K219 Gastro-esophageal reflux disease without esophagitis: Secondary | ICD-10-CM | POA: Diagnosis not present

## 2020-12-20 DIAGNOSIS — M150 Primary generalized (osteo)arthritis: Secondary | ICD-10-CM | POA: Diagnosis not present

## 2020-12-20 DIAGNOSIS — G8929 Other chronic pain: Secondary | ICD-10-CM | POA: Diagnosis not present

## 2020-12-20 DIAGNOSIS — M810 Age-related osteoporosis without current pathological fracture: Secondary | ICD-10-CM | POA: Diagnosis not present

## 2020-12-20 DIAGNOSIS — I10 Essential (primary) hypertension: Secondary | ICD-10-CM | POA: Diagnosis not present

## 2020-12-20 DIAGNOSIS — J479 Bronchiectasis, uncomplicated: Secondary | ICD-10-CM | POA: Diagnosis not present

## 2021-02-02 DIAGNOSIS — H00024 Hordeolum internum left upper eyelid: Secondary | ICD-10-CM | POA: Diagnosis not present

## 2021-02-05 DIAGNOSIS — Z23 Encounter for immunization: Secondary | ICD-10-CM | POA: Diagnosis not present

## 2021-02-22 DIAGNOSIS — J3089 Other allergic rhinitis: Secondary | ICD-10-CM | POA: Diagnosis not present

## 2021-02-22 DIAGNOSIS — K219 Gastro-esophageal reflux disease without esophagitis: Secondary | ICD-10-CM | POA: Diagnosis not present

## 2021-02-22 DIAGNOSIS — I10 Essential (primary) hypertension: Secondary | ICD-10-CM | POA: Diagnosis not present

## 2021-02-22 DIAGNOSIS — M150 Primary generalized (osteo)arthritis: Secondary | ICD-10-CM | POA: Diagnosis not present

## 2021-02-22 DIAGNOSIS — G8928 Other chronic postprocedural pain: Secondary | ICD-10-CM | POA: Diagnosis not present

## 2021-02-22 DIAGNOSIS — M810 Age-related osteoporosis without current pathological fracture: Secondary | ICD-10-CM | POA: Diagnosis not present

## 2021-02-22 DIAGNOSIS — K911 Postgastric surgery syndromes: Secondary | ICD-10-CM | POA: Diagnosis not present

## 2021-03-05 NOTE — Progress Notes (Signed)
PROVIDER NOTE: Information contained herein reflects review and annotations entered in association with encounter. Interpretation of such information and data should be left to medically-trained personnel. Information provided to patient can be located elsewhere in the medical record under "Patient Instructions". Document created using STT-dictation technology, any transcriptional errors that may result from process are unintentional.    Patient: Kari Gibbs  Service Category: E/M  Provider: Gaspar Cola, MD  DOB: July 18, 1951  DOS: 03/07/2021  Specialty: Interventional Pain Management  MRN: [id number]  Setting: Ambulatory outpatient  PCP: Cari Caraway, MD  Type: Established Patient    Referring Provider: Cari Caraway, MD  Location: Office  Delivery: Face-to-face     HPI  [name], a 70 y.o. year old female, is here today because of her Chronic pain syndrome [G89.4]. Ms. Stall primary complain today is Abdominal Pain (upper) and Back Pain Last encounter: My last encounter with her was on 12/06/2020. Pertinent problems: Ms. Vossler has Fracture of rib (4th and 8th rib) (Left); Chronic pain syndrome; Chronic flank pain (Primary Area of Pain) (Left); Chronic thoracic radicular pain (Left-sided); Chronic occipital neuralgia (Left); Neuropathic pain; Neurogenic pain; History of rib fracture (4th and 8th rib) (Left); Vertebral body hemangioma (T11); Metatarsalgia of foot (Right); Chronic abdominal pain (Bilateral) (L>R) (possible left-sided thoracic radiculopathy); Primary osteoarthritis; Chronic neck pain (Bilateral) (L>R); Cervical facet syndrome (Bilateral) (L>R); Muscle spasticity; Tremors of nervous system; Muscle twitching; Cervical spondylosis; Chronic low back pain (Bilateral) w/o sciatica; Acute costochondritis (Left); Chronic low back pain (Bilateral) w/ sciatica (Bilateral); Chronic sacroiliac joint pain (Bilateral); Other specified dorsopathies, sacral and sacrococcygeal  region; Chronic sacroiliac joint somatic dysfunction (Bilateral); and Chronic postoperative pain on their pertinent problem list. Pain Assessment: Severity of Chronic pain is reported as a 4 /10. Location: Abdomen Upper/denies. Onset: More than a month ago. Quality: Pressure. Timing: Intermittent. Modifying factor(s): antinausea, heating pad, breathing exercises. Vitals:  height is $RemoveB'5\' 5"'hUAAaTEq$  (1.651 m) and weight is 219 lb (99.3 kg). Her temperature is 96.8 F (36 C) (abnormal). Her blood pressure is 139/77 and her pulse is 70. Her respiration is 18 and oxygen saturation is 98%.   Reason for encounter: medication management.   The patient indicates doing well with the current medication regimen. No adverse reactions or side effects reported to the medications.  Over time, the patient seems to have gained a significant amount of weight since she for started coming here.  Her current BMI is 36.44 kg/m. UDS ordered today.   Today the patient indicates that she is ready to get her "Drug Holiday" started as she is anxious to know how she would feel being off of the pain medicine.  Today I went ahead and created a regimen to slowly taper down the patient's opioids so as to avoid any type of withdrawals.  The patient is looking forward to this and to hopefully do well without the medication so as to determine whether or not she needs to continue using them.  RTCB: 05/02/2021 Nonopioids transferred 05/29/2020: Gabapentin and Voltaren gel  Pharmacotherapy Assessment  Analgesic: MS Contin 30 mg, 1 tab PO q 12 hrs (60 mg/day of morphine) MME/day: 60 mg/day.   Monitoring: [place] PMP: PDMP reviewed during this encounter.       Pharmacotherapy: No side-effects or adverse reactions reported. Compliance: No problems identified. Effectiveness: Clinically acceptable.  Dewayne Shorter, RN  03/07/2021 12:42 PM  Sign when Signing Visit Nursing Pain Medication Assessment:  Safety precautions to be maintained throughout the  outpatient stay  will include: orient to surroundings, keep bed in low position, maintain call bell within reach at all times, provide assistance with transfer out of bed and ambulation.  Medication Inspection Compliance: Pill count conducted under aseptic conditions, in front of the patient. Neither the pills nor the bottle was removed from the patient's sight at any time. Once count was completed pills were immediately returned to the patient in their original bottle.  Medication: Morphine ER (MSContin) Pill/Patch Count:  5 of 60 pills remain Pill/Patch Appearance: Markings consistent with prescribed medication Bottle Appearance: Standard pharmacy container. Clearly labeled. Filled Date: 06 / 30 / 2022 Last Medication intake:  Today    UDS:  Summary  Date Value Ref Range Status  12/09/2019 Note  Final    Comment:    ==================================================================== ToxASSURE Select 13 (MW) ==================================================================== Test                             Result       Flag       Units Drug Present and Declared for Prescription Verification   Morphine                       >7937        EXPECTED   ng/mg creat   Normorphine                    913          EXPECTED   ng/mg creat    Potential sources of large amounts of morphine in the absence of    codeine include administration of morphine or use of heroin.    Normorphine is an expected metabolite of morphine.   Hydromorphone                  309          EXPECTED   ng/mg creat    Hydromorphone may be present as a metabolite of morphine;    concentrations of hydromorphone rarely exceed 5% of the morphine    concentration when this is the source of hydromorphone. ==================================================================== Test                      Result    Flag   Units      Ref Range   Creatinine              126              mg/dL       >=20 ==================================================================== Declared Medications:  The flagging and interpretation on this report are based on the  following declared medications.  Unexpected results may arise from  inaccuracies in the declared medications.  **Note: The testing scope of this panel includes these medications:  Morphine  **Note: The testing scope of this panel does not include the  following reported medications:  Azelastine  Benzonatate  Buspirone  Cholecalciferol  Cranberry  Cyanocobalamin  Dextromethorphan  Esomeprazole (Nexium)  Gabapentin  Irbesartan  Iron  Loratadine  Magnesium  Multivitamin  Naloxone  Nifedipine  Probiotic  Promethazine  Topical Diclofenac  Venlafaxine  Vitamin C ==================================================================== For clinical consultation, please call [phone]. ====================================================================      ROS  Constitutional: Denies any fever or chills Gastrointestinal: No reported hemesis, hematochezia, vomiting, or acute GI distress Musculoskeletal: Denies any acute onset joint swelling, redness, loss of ROM, or weakness Neurological:  No reported episodes of acute onset apraxia, aphasia, dysarthria, agnosia, amnesia, paralysis, loss of coordination, or loss of consciousness  Medication Review  Biotin, Cranberry, Flutter, Iron-Vitamin C, LORazepam, Magnesium, Peppermint Oil, Prenatal MV & Min w/FA-DHA, RA Probiotic Gummies, Vitamin D3, acetaminophen, azelastine, benzonatate, busPIRone, conjugated estrogens, diclofenac Sodium, esomeprazole, gabapentin, ibuprofen, irbesartan, loratadine, montelukast, morphine, naloxone, oxyCODONE, potassium gluconate, and promethazine-dextromethorphan  History Review  Allergy: Ms. Ashmore is allergic to iohexol, ace inhibitors, losartan potassium, and neosporin [neomycin-bacitracin zn-polymyx]. Drug: Ms. Ruggieri  reports no  history of drug use. Alcohol:  reports previous alcohol use. Tobacco:  reports that she has never smoked. She has never used smokeless tobacco. Social: Ms. Hosking  reports that she has never smoked. She has never used smokeless tobacco. She reports previous alcohol use. She reports that she does not use drugs. 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 Surgical: Ms. 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 Family: family history includes Alzheimer's disease in her father; Hypertension in her mother.  Laboratory Chemistry Profile   Renal Lab Results  Component Value Date   BUN 20 01/04/2016   CREATININE 0.76 01/04/2016   GFRAA >60 01/04/2016   GFRNONAA >60 01/04/2016    Hepatic Lab Results  Component Value Date   AST 33 01/04/2016   ALT 26 01/04/2016   ALBUMIN 4.2 01/04/2016   ALKPHOS 97 01/04/2016   AMYLASE 30 02/27/2010   LIPASE 22 10/12/2012    Electrolytes Lab Results  Component Value Date   NA 139 01/04/2016    K 4.6 01/04/2016   CL 99 (L) 01/04/2016   CALCIUM 9.7 01/04/2016   MG 2.1 01/04/2016    Bone Lab Results  Component Value Date   25OHVITD1 49 01/04/2016   25OHVITD2 25 01/04/2016   25OHVITD3 24 01/04/2016    Inflammation (CRP: Acute Phase) (ESR: Chronic Phase) Lab Results  Component Value Date   CRP <0.5 01/04/2016   ESRSEDRATE 17 01/04/2016         Note: Above Lab results reviewed.  Recent Imaging Review  DG C-Arm 1-60 Min CLINICAL DATA:  ORIF RIGHT wrist  EXAM: RIGHT WRIST - COMPLETE 3+ VIEW; DG C-ARM 1-60 MIN  COMPARISON:  04/05/2020  FLUOROSCOPY TIME:  0 minutes 30 seconds  Dose: 0.84 mGy  Images: 3  FINDINGS: Dorsal plate and multiple screws identified at the distal RIGHT radius post ORIF of previously seen distal radial metaphyseal fracture.  Displacement of fragments and dorsal tilt of distal radial articular surface appear improved since the previous exam.  Diffuse osseous demineralization.  No additional acute osseous findings.  IMPRESSION: Post ORIF distal RIGHT radius.  Electronically Signed   By: Lavonia Dana M.D.   On: 04/10/2020 15:25 DG Wrist Complete Right CLINICAL DATA:  ORIF RIGHT wrist  EXAM: RIGHT WRIST - COMPLETE 3+  VIEW; DG C-ARM 1-60 MIN  COMPARISON:  04/05/2020  FLUOROSCOPY TIME:  0 minutes 30 seconds  Dose: 0.84 mGy  Images: 3  FINDINGS: Dorsal plate and multiple screws identified at the distal RIGHT radius post ORIF of previously seen distal radial metaphyseal fracture.  Displacement of fragments and dorsal tilt of distal radial articular surface appear improved since the previous exam.  Diffuse osseous demineralization.  No additional acute osseous findings.  IMPRESSION: Post ORIF distal RIGHT radius.  Electronically Signed   By: Lavonia Dana M.D.   On: 04/10/2020 15:25 Note: Reviewed        Physical Exam  General appearance: Well nourished, well developed, and well hydrated. In no apparent acute  distress Mental status: Alert, oriented x 3 (person, place, & time)       Respiratory: No evidence of acute respiratory distress Eyes: PERLA Vitals: BP 139/77 (BP Location: Left Arm, Patient Position: Sitting, Cuff Size: Normal)   Pulse 70   Temp (!) 96.8 F (36 C)   Resp 18   Ht $R'5\' 5"'EQ$  (1.651 m)   Wt 219 lb (99.3 kg)   SpO2 98%   BMI 36.44 kg/m  BMI: Estimated body mass index is 36.44 kg/m as calculated from the following:   Height as of this encounter: $RemoveBeforeD'5\' 5"'TXDerSGNCuhQOM$  (1.651 m).   Weight as of this encounter: 219 lb (99.3 kg). Ideal: Ideal body weight: 57 kg (125 lb 10.6 oz) Adjusted ideal body weight: 73.9 kg (163 lb)  Assessment   Status Diagnosis  Controlled Controlled Controlled 1. Chronic pain syndrome   2. Chronic flank pain (Primary Area of Pain) (Left)   3. Chronic low back pain (Bilateral) w/ sciatica (Bilateral)   4. Chronic thoracic radicular pain (Left-sided)   5. Pharmacologic therapy   6. Chronic use of opiate for therapeutic purpose   7. Encounter for medication management      Updated Problems: No problems updated.  Plan of Care  Problem-specific:  No problem-specific Assessment & Plan notes found for this encounter.  Ms. Johan Antonacci has a current medication list which includes the following long-term medication(s): diclofenac sodium, gabapentin, gabapentin, loratadine, [START ON 03/10/2021] morphine, [START ON 03/23/2021] oxycodone, [START ON 03/30/2021] oxycodone, and [START ON 04/06/2021] oxycodone.  Pharmacotherapy (Medications Ordered): Meds ordered this encounter  Medications   morphine (MSIR) 15 MG tablet    Sig: Take 1 tablet (15 mg total) by mouth every 8 (eight) hours for 7 days, THEN 1 tablet (15 mg total) in the morning and at bedtime for 7 days. Must last 30 days..    Dispense:  35 tablet    Refill:  0    Prescription is part of a downward taper. Fill prescriptions in the correct order. Failure to do so may trigger withdrawal syndrome. Fill date:  03/10/2021. To last until: 03/23/2021.   oxyCODONE (OXY IR/ROXICODONE) 5 MG immediate release tablet    Sig: Take 1 tablet (5 mg total) by mouth 3 (three) times daily for 7 days. Max: 3/day. Must last 7 days.    Dispense:  21 tablet    Refill:  0    Prescription is part of a downward taper. Fill prescriptions in the correct order. Failure to do so may trigger withdrawal syndrome. Fill date: 03/23/2021. To last until: 03/30/2021.   oxyCODONE (OXY IR/ROXICODONE) 5 MG immediate release tablet    Sig: Take 1 tablet (5 mg total) by mouth 2 (two) times daily for 7 days. Max: 2/day. Must last 7 days.  Dispense:  14 tablet    Refill:  0    Prescription is part of a downward taper. Fill prescriptions in the correct order. Failure to do so may trigger withdrawal syndrome. Fill date: 03/30/2021. To last until: 04/06/2021.   oxyCODONE (OXY IR/ROXICODONE) 5 MG immediate release tablet    Sig: Take 1 tablet (5 mg total) by mouth daily for 7 days. Max: 1/day. Must last 7 days.    Dispense:  7 tablet    Refill:  0    Prescription is part of a downward taper. Fill prescriptions in the correct order. Failure to do so may trigger withdrawal syndrome. Fill date: 04/06/2021. To last until: 04/13/2021.    Orders:  Orders Placed This Encounter  Procedures   ToxASSURE Select 13 (MW), Urine    Volume: 30 ml(s). Minimum 3 ml of urine is needed. Document temperature of fresh sample. Indications: Long term (current) use of opiate analgesic (E31.540)    Order Specific Question:   Release to patient    Answer:   Immediate    Follow-up plan:   Return in about 8 weeks (around 05/02/2021) for evaluation day (F2F) (MM) after completing drug holiday.     Interventional management options:  Considering:   Diagnostic left costochondral joint injection  Possible left intercostal nerve RFA  Therapeutic Left greater occipital nerve RFA  Diagnostic left C2 + TON nerve block  Possible left C2 + TON RFA    Palliative PRN  treatment(s):   Palliative left intercostal nerve blocks (T5 to T8) #2  Palliative left GONB block #3  Palliative bilateral sacroiliac joint block #2     Recent Visits No visits were found meeting these conditions. Showing recent visits within past 90 days and meeting all other requirements Today's Visits Date Type Provider Dept  03/07/21 Office Visit [name], MD [facility]  Showing today's visits and meeting all other requirements Future Appointments Date Type Provider Dept  04/30/21 Appointment [name], MD [facility]  Showing future appointments within next 90 days and meeting all other requirements I discussed the assessment and treatment plan with the patient. The patient was provided an opportunity to ask questions and all were answered. The patient agreed with the plan and demonstrated an understanding of the instructions.  Patient advised to call back or seek an in-person evaluation if the symptoms or condition worsens.  Duration of encounter: 30 minutes.  Note by: Gaspar Cola, MD Date: 03/07/2021; Time: 4:59 PM

## 2021-03-07 ENCOUNTER — Other Ambulatory Visit: Payer: Self-pay

## 2021-03-07 ENCOUNTER — Encounter: Payer: Self-pay | Admitting: Pain Medicine

## 2021-03-07 ENCOUNTER — Ambulatory Visit: Payer: Medicare Other | Attending: Pain Medicine | Admitting: Pain Medicine

## 2021-03-07 VITALS — BP 139/77 | HR 70 | Temp 96.8°F | Resp 18 | Ht 65.0 in | Wt 219.0 lb

## 2021-03-07 DIAGNOSIS — R109 Unspecified abdominal pain: Secondary | ICD-10-CM | POA: Diagnosis not present

## 2021-03-07 DIAGNOSIS — M5441 Lumbago with sciatica, right side: Secondary | ICD-10-CM | POA: Diagnosis not present

## 2021-03-07 DIAGNOSIS — G8929 Other chronic pain: Secondary | ICD-10-CM | POA: Insufficient documentation

## 2021-03-07 DIAGNOSIS — Z79891 Long term (current) use of opiate analgesic: Secondary | ICD-10-CM | POA: Diagnosis not present

## 2021-03-07 DIAGNOSIS — M5442 Lumbago with sciatica, left side: Secondary | ICD-10-CM | POA: Insufficient documentation

## 2021-03-07 DIAGNOSIS — M5414 Radiculopathy, thoracic region: Secondary | ICD-10-CM | POA: Insufficient documentation

## 2021-03-07 DIAGNOSIS — G894 Chronic pain syndrome: Secondary | ICD-10-CM | POA: Insufficient documentation

## 2021-03-07 DIAGNOSIS — Z79899 Other long term (current) drug therapy: Secondary | ICD-10-CM | POA: Insufficient documentation

## 2021-03-07 MED ORDER — MORPHINE SULFATE 15 MG PO TABS: ORAL_TABLET | ORAL | 0 refills | Status: DC

## 2021-03-07 MED ORDER — OXYCODONE HCL 5 MG PO TABS: 5.0000 mg | ORAL_TABLET | Freq: Two times a day (BID) | ORAL | 0 refills | Status: DC

## 2021-03-07 MED ORDER — OXYCODONE HCL 5 MG PO TABS: 5.0000 mg | ORAL_TABLET | Freq: Three times a day (TID) | ORAL | 0 refills | Status: DC

## 2021-03-07 MED ORDER — OXYCODONE HCL 5 MG PO TABS: 5.0000 mg | ORAL_TABLET | Freq: Every day | ORAL | 0 refills | Status: DC

## 2021-03-07 NOTE — Progress Notes (Signed)
Nursing Pain Medication Assessment:  Safety precautions to be maintained throughout the outpatient stay will include: orient to surroundings, keep bed in low position, maintain call bell within reach at all times, provide assistance with transfer out of bed and ambulation.  Medication Inspection Compliance: Pill count conducted under aseptic conditions, in front of the patient. Neither the pills nor the bottle was removed from the patient's sight at any time. Once count was completed pills were immediately returned to the patient in their original bottle.  Medication: Morphine ER (MSContin) Pill/Patch Count:  5 of 60 pills remain Pill/Patch Appearance: Markings consistent with prescribed medication Bottle Appearance: Standard pharmacy container. Clearly labeled. Filled Date: 06 / 30 / 2022 Last Medication intake:  Today

## 2021-03-07 NOTE — Patient Instructions (Signed)
____________________________________________________________________________________________  Drug Holidays (Slow)  What is a "Drug Holiday"? Drug Holiday: is the name given to the period of time during which a patient stops taking a medication(s) for the purpose of eliminating tolerance to the drug.  Benefits . Improved effectiveness of opioids. . Decreased opioid dose needed to achieve benefits. . Improved pain with lesser dose.  What is tolerance? Tolerance: is the progressive decreased in effectiveness of a drug due to its repetitive use. With repetitive use, the body gets use to the medication and as a consequence, it loses its effectiveness. This is a common problem seen with opioid pain medications. As a result, a larger dose of the drug is needed to achieve the same effect that used to be obtained with a smaller dose.  How long should a "Drug Holiday" last? You should stay off of the pain medicine for at least 14 consecutive days. (2 weeks)  Should I stop the medicine "cold turkey"? No. You should always coordinate with your Pain Specialist so that he/she can provide you with the correct medication dose to make the transition as smoothly as possible.  How do I stop the medicine? Slowly. You will be instructed to decrease the daily amount of pills that you take by one (1) pill every seven (7) days. This is called a "slow downward taper" of your dose. For example: if you normally take four (4) pills per day, you will be asked to drop this dose to three (3) pills per day for seven (7) days, then to two (2) pills per day for seven (7) days, then to one (1) per day for seven (7) days, and at the end of those last seven (7) days, this is when the "Drug Holiday" would start.   Will I have withdrawals? By doing a "slow downward taper" like this one, it is unlikely that you will experience any significant withdrawal symptoms. Typically, what triggers withdrawals is the sudden stop of a high  dose opioid therapy. Withdrawals can usually be avoided by slowly decreasing the dose over a prolonged period of time. If you do not follow these instructions and decide to stop your medication abruptly, withdrawals may be possible.  What are withdrawals? Withdrawals: refers to the wide range of symptoms that occur after stopping or dramatically reducing opiate drugs after heavy and prolonged use. Withdrawal symptoms do not occur to patients that use low dose opioids, or those who take the medication sporadically. Contrary to benzodiazepine (example: Valium, Xanax, etc.) or alcohol withdrawals ("Delirium Tremens"), opioid withdrawals are not lethal. Withdrawals are the physical manifestation of the body getting rid of the excess receptors.  Expected Symptoms Early symptoms of withdrawal may include: . Agitation . Anxiety . Muscle aches . Increased tearing . Insomnia . Runny nose . Sweating . Yawning  Late symptoms of withdrawal may include: . Abdominal cramping . Diarrhea . Dilated pupils . Goose bumps . Nausea . Vomiting  Will I experience withdrawals? Due to the slow nature of the taper, it is very unlikely that you will experience any.  What is a slow taper? Taper: refers to the gradual decrease in dose.  (Last update: 03/01/2020) ____________________________________________________________________________________________     

## 2021-03-09 LAB — TOXASSURE SELECT 13 (MW), URINE

## 2021-03-20 ENCOUNTER — Telehealth: Payer: Self-pay | Admitting: Pain Medicine

## 2021-03-20 ENCOUNTER — Other Ambulatory Visit: Payer: Self-pay | Admitting: Pain Medicine

## 2021-03-20 DIAGNOSIS — F19982 Other psychoactive substance use, unspecified with psychoactive substance-induced sleep disorder: Secondary | ICD-10-CM

## 2021-03-20 DIAGNOSIS — F1193 Opioid use, unspecified with withdrawal: Secondary | ICD-10-CM | POA: Insufficient documentation

## 2021-03-20 DIAGNOSIS — G4700 Insomnia, unspecified: Secondary | ICD-10-CM | POA: Insufficient documentation

## 2021-03-20 DIAGNOSIS — F1123 Opioid dependence with withdrawal: Secondary | ICD-10-CM | POA: Insufficient documentation

## 2021-03-20 MED ORDER — MELATONIN 10 MG PO CAPS: 20.0000 mg | ORAL_CAPSULE | Freq: Every evening | ORAL | 0 refills | Status: AC | PRN

## 2021-03-20 MED ORDER — CLONIDINE HCL 0.1 MG PO TABS: 0.1000 mg | ORAL_TABLET | Freq: Every day | ORAL | 0 refills | Status: DC

## 2021-03-20 NOTE — Telephone Encounter (Signed)
Clonidine and melatonin sent to pharmacy.  Check on her tomorrow.

## 2021-03-20 NOTE — Telephone Encounter (Signed)
Patient is calling regarding drug holiday, hasn't slept in four days. Is now having pretty severe withdrawals. Was  in tears. Please call and advise what can help her get thru this.

## 2021-03-20 NOTE — Telephone Encounter (Signed)
Instructions on how to take these meds explained to patient. I informed her that we will call her tomorrow.

## 2021-03-20 NOTE — Telephone Encounter (Signed)
Ask her if she tried the Ativan or if she is taking the Neurontin at HS?

## 2021-03-20 NOTE — Progress Notes (Unsigned)
Patient called indicating having some withdrawals from her opioid taper.  She indicates being unable to sleep well.  I will be sending a prescription for melatonin for her insomnia and further withdrawals I will give her a prescription to take clonidine 0.1 mg p.o. at bedtime.

## 2021-03-20 NOTE — Telephone Encounter (Signed)
Spoke with patient, requested that she come in for a nurse visit. She is requesting something for sleep. Will ask Dr. Dossie Arbour specifically about that.

## 2021-03-20 NOTE — Telephone Encounter (Signed)
She is taking the taper as prescribed. She told me how she is taking it, and it correlates with the directions. She is unable to sleep, having alternating hot/cold flashes, having headaches.

## 2021-03-20 NOTE — Telephone Encounter (Signed)
Bubble message sent to Dr. Naveira. 

## 2021-03-20 NOTE — Telephone Encounter (Signed)
Check to see that my orders are being followed. Make sure the pharmacy is dispensing the scripts in the correct order. She should not be having withdrawals. Have her come in for a nurse visit and check BP & HR.

## 2021-03-20 NOTE — Telephone Encounter (Signed)
She takes Neurontin 400 mg at bedtime, Lorazepam 1 mg twice per day

## 2021-03-21 ENCOUNTER — Telehealth: Payer: Self-pay | Admitting: *Deleted

## 2021-03-21 DIAGNOSIS — M810 Age-related osteoporosis without current pathological fracture: Secondary | ICD-10-CM | POA: Diagnosis not present

## 2021-03-21 NOTE — Telephone Encounter (Signed)
Called patient to ask how she is doing after taking Clonazepam and Melatonin. States she slept much better last night, but pain is getting worse as the drug holiday progresses. I reitterated to her that this is expected, and she should receive better relief of pain after the drug holiday.

## 2021-03-26 ENCOUNTER — Telehealth: Payer: Self-pay

## 2021-03-26 NOTE — Telephone Encounter (Signed)
Pt says that she is having the same initial pain the she was seen for initially. Says it is 10x worse she can barely touch her chest area. And she is very uncomfortable only time she sees any relief is when she uses excessive heat.

## 2021-03-26 NOTE — Telephone Encounter (Signed)
Patient states that her original pain has come back and she noted that this return of pain started when she completely tapered off of the MSO4 and was just taking the oxycodone TID. She states the clonidine only helped her sleep for one night and cannot sleep now due to increased pain. Bubble sent to Dr. Dossie Arbour for advise, then I will call patient

## 2021-03-27 ENCOUNTER — Telehealth: Payer: Self-pay

## 2021-03-27 NOTE — Telephone Encounter (Signed)
I have bubbled Dr. Dossie Arbour with this information on 03/26/2021 and it has been read by him with no response to me as of 03/27/2021 at 1451.

## 2021-03-27 NOTE — Telephone Encounter (Signed)
Pt states she is in A LOT of pain and is barely unable to move about not sure what to do

## 2021-03-28 ENCOUNTER — Other Ambulatory Visit: Payer: Self-pay

## 2021-03-28 ENCOUNTER — Encounter: Payer: Self-pay | Admitting: Pain Medicine

## 2021-03-28 ENCOUNTER — Ambulatory Visit: Payer: Medicare Other | Attending: Pain Medicine | Admitting: Pain Medicine

## 2021-03-28 ENCOUNTER — Telehealth: Payer: Self-pay

## 2021-03-28 VITALS — BP 135/60 | HR 71 | Temp 96.9°F | Ht 65.0 in | Wt 219.0 lb

## 2021-03-28 DIAGNOSIS — R059 Cough, unspecified: Secondary | ICD-10-CM | POA: Insufficient documentation

## 2021-03-28 DIAGNOSIS — F1190 Opioid use, unspecified, uncomplicated: Secondary | ICD-10-CM | POA: Insufficient documentation

## 2021-03-28 DIAGNOSIS — R1012 Left upper quadrant pain: Secondary | ICD-10-CM | POA: Insufficient documentation

## 2021-03-28 DIAGNOSIS — R52 Pain, unspecified: Secondary | ICD-10-CM | POA: Insufficient documentation

## 2021-03-28 DIAGNOSIS — M792 Neuralgia and neuritis, unspecified: Secondary | ICD-10-CM | POA: Insufficient documentation

## 2021-03-28 DIAGNOSIS — Z8781 Personal history of (healed) traumatic fracture: Secondary | ICD-10-CM | POA: Insufficient documentation

## 2021-03-28 DIAGNOSIS — F1193 Opioid use, unspecified with withdrawal: Secondary | ICD-10-CM | POA: Insufficient documentation

## 2021-03-28 DIAGNOSIS — G894 Chronic pain syndrome: Secondary | ICD-10-CM | POA: Diagnosis not present

## 2021-03-28 DIAGNOSIS — F19982 Other psychoactive substance use, unspecified with psychoactive substance-induced sleep disorder: Secondary | ICD-10-CM | POA: Insufficient documentation

## 2021-03-28 MED ORDER — KETOROLAC TROMETHAMINE 60 MG/2ML IM SOLN
INTRAMUSCULAR | Status: AC
  Filled 2021-03-28: qty 2

## 2021-03-28 MED ORDER — KETOROLAC TROMETHAMINE 60 MG/2ML IM SOLN: 60.0000 mg | Freq: Once | INTRAMUSCULAR | Status: DC

## 2021-03-28 MED ORDER — ORPHENADRINE CITRATE 30 MG/ML IJ SOLN: 60.0000 mg | Freq: Once | INTRAMUSCULAR | Status: DC

## 2021-03-28 MED ORDER — ORPHENADRINE CITRATE 30 MG/ML IJ SOLN
INTRAMUSCULAR | Status: AC
  Filled 2021-03-28: qty 2

## 2021-03-28 MED ORDER — GABAPENTIN 800 MG PO TABS: 800.0000 mg | ORAL_TABLET | Freq: Every day | ORAL | 0 refills | Status: DC

## 2021-03-28 NOTE — Telephone Encounter (Signed)
Spoke with Dr Dossie Arbour and he states that the patient may come if for a Toradol/Norflex injection if she wishes.  Message sent to [facility] to call patient to schedule.

## 2021-03-28 NOTE — Progress Notes (Signed)
PROVIDER NOTE: Information contained herein reflects review and annotations entered in association with encounter. Interpretation of such information and data should be left to medically-trained personnel. Information provided to patient can be located elsewhere in the medical record under "Patient Instructions". Document created using STT-dictation technology, any transcriptional errors that may result from process are unintentional.    Patient: Kari Gibbs  Service Category: E/M  Provider: Gaspar Cola, MD  DOB: September 20, 1950  DOS: 03/28/2021  Specialty: Interventional Pain Management  MRN: [id number]  Setting: Ambulatory outpatient  PCP: Cari Caraway, MD  Type: Established Patient    Referring Provider: Cari Caraway, MD  Location: Office  Delivery: Face-to-face     HPI  [name], a 70 y.o. year old female, is here today because of her Chronic pain syndrome [G89.4]. Ms. Huot primary complain today is No chief complaint on file. Last encounter: My last encounter with her was on 03/20/2021. Pertinent problems: Ms. Foxworth has Fracture of rib (4th and 8th rib) (Left); Chronic pain syndrome; Chronic flank pain (Primary Area of Pain) (Left); Chronic thoracic radicular pain (Left-sided); Chronic occipital neuralgia (Left); Neuropathic pain; Neurogenic pain; History of rib fracture (4th and 8th rib) (Left); Vertebral body hemangioma (T11); Metatarsalgia of foot (Right); Chronic abdominal pain (Bilateral) (L>R) (possible left-sided thoracic radiculopathy); Primary osteoarthritis; Chronic neck pain (Bilateral) (L>R); Cervical facet syndrome (Bilateral) (L>R); Muscle spasticity; Tremors of nervous system; Muscle twitching; Cervical spondylosis; Chronic low back pain (Bilateral) w/o sciatica; Acute costochondritis (Left); Chronic low back pain (Bilateral) w/ sciatica (Bilateral); Chronic sacroiliac joint pain (Bilateral); Other specified dorsopathies, sacral and sacrococcygeal region;  Chronic sacroiliac joint somatic dysfunction (Bilateral); Chronic postoperative pain; Epigastric pain; Left upper quadrant pain; and Generalized pain on their pertinent problem list. Pain Assessment: Severity of   is reported as a 8 /10. Location:    / . Onset:  . Quality:  . Timing:  . Modifying factor(s):  Marland Kitchen Vitals:  height is _0  (1.651 m) and weight is 219 lb (99.3 kg). Her temperature is 96.9 F (36.1 C) (abnormal). Her blood pressure is 135/60 and her pulse is 71. Her oxygen saturation is 99%.   Reason for encounter: worsening of previously known (established) problem.  We are currently tapering her opioids down.  She indicates having a flareup of her left flank neuropathic pain.  Today I had her come in for an IM injection of Toradol/Norflex 60/60 mg for her acute pain.  She indicates that she is currently taking the gabapentin 400 mg p.o. at bedtime and in the morning and she denies any type of side effects to it.  Because this pain is causing some problems with her ability to sleep, today I will send a prescription to her pharmacy increasing the gabapentin to 800 mg p.o. at bedtime.   In terms of her opioid tapering, she is already off of the morphine and she is currently on her oxycodone IR 5 mg 3 times daily, which we will continue to titrate down until we are completely off of the opioids for at least 2 weeks.  Today I will also be ordering genetic testing on her cytochrome P450 to determine if she is a rapid opioid metabolizer.  Pharmacotherapy Assessment  Analgesic: MS Contin 30 mg, 1 tab PO q 12 hrs (60 mg/day of morphine) MME/day: 60 mg/day.   Monitoring: [place] PMP: PDMP reviewed during this encounter.       Pharmacotherapy: No side-effects or adverse reactions reported. Compliance: No problems identified. Effectiveness: Clinically acceptable.  No notes on file  UDS:  Summary  Date Value Ref Range Status  03/07/2021 Note  Final    Comment:     ==================================================================== [employer] Select 13 (MW) ==================================================================== Test                             Result       Flag       Units  Drug Present and Declared for Prescription Verification   Lorazepam                      1518         EXPECTED   ng/mg creat    Source of lorazepam is a scheduled prescription medication.    Morphine                       >9259        EXPECTED   ng/mg creat   Normorphine                    533          EXPECTED   ng/mg creat    Potential sources of large amounts of morphine in the absence of    codeine include administration of morphine or use of heroin.     Normorphine is an expected metabolite of morphine.    Hydromorphone                  126          EXPECTED   ng/mg creat    Hydromorphone may be present as a metabolite of morphine;    concentrations of hydromorphone rarely exceed 5% of the morphine    concentration when this is the source of hydromorphone.  Drug Absent but Declared for Prescription Verification   Oxycodone                      Not Detected UNEXPECTED ng/mg creat ==================================================================== Test                      Result    Flag   Units      Ref Range   Creatinine              108              mg/dL      >=20 ==================================================================== Declared Medications:  The flagging and interpretation on this report are based on the  following declared medications.  Unexpected results may arise from  inaccuracies in the declared medications.   **Note: The testing scope of this panel includes these medications:   Lorazepam (Ativan)  Morphine (MSIR)  Oxycodone (Roxicodone)   **Note: The testing scope of this panel does not include the  following reported medications:   Acetaminophen (Tylenol)  Azelastine (Astelin)  Benzonatate (Tessalon)  Biotin  Buspirone  (Buspar)  Cranberry  Dextromethorphan  Diclofenac (Voltaren)  Esomeprazole (Nexium)  Estrogens (Premarin)  Folic Acid  Gabapentin (Neurontin)  Ibuprofen (Advil)  Irbesartan (Avapro)  Iron  Loratadine (Claritin)  Magnesium  Montelukast (Singulair)  Multivitamin  Naloxone (Narcan)  Potassium  Promethazine  Supplement  Vitamin C  Vitamin D3 ==================================================================== For clinical consultation, please call [phone]. ====================================================================      ROS  Constitutional: Denies any fever or chills Gastrointestinal: No reported hemesis, hematochezia, vomiting, or acute GI distress  Musculoskeletal: Denies any acute onset joint swelling, redness, loss of ROM, or weakness Neurological: No reported episodes of acute onset apraxia, aphasia, dysarthria, agnosia, amnesia, paralysis, loss of coordination, or loss of consciousness  Medication Review  Biotin, Cranberry, Flutter, Iron-Vitamin C, LORazepam, Magnesium, Melatonin, Peppermint Oil, Prenatal MV & Min w/FA-DHA, RA Probiotic Gummies, Vitamin D3, acetaminophen, azelastine, benzonatate, busPIRone, cloNIDine, conjugated estrogens, diclofenac Sodium, esomeprazole, gabapentin, ibuprofen, irbesartan, loratadine, montelukast, morphine, naloxone, oxyCODONE, potassium gluconate, and promethazine-dextromethorphan  History Review  Allergy: Ms. Schmall is allergic to iohexol, ace inhibitors, losartan potassium, and neosporin [neomycin-bacitracin zn-polymyx]. Drug: Ms. Durocher  reports no history of drug use. Alcohol:  reports that she does not currently use alcohol. Tobacco:  reports that she has never smoked. She has never used smokeless tobacco. Social: Ms. Nemmers  reports that she has never smoked. She has never used smokeless tobacco. She reports that she does not currently use alcohol. She reports that she does not use drugs. 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 Surgical: Ms. Gagner  has a past surgical history that includes Gastric bypass (11/13/2009); Exploratory laparotomy (2012); TMJ- LEFT--ABOUT 10 YRS AGO--LEFT SIDE--PT NOW HAS SOME PAIN LEFT JAW (Left); laparoscopy (03/04/2012); Hernia repair (2011); Abdominal hysterectomy (1988); Cholecystectomy (N/A, 05/18/2013); Shoulder surgery (Right, 06/16/2017); Endovenous ablation saphenous vein w/ laser (Left, 02/04/2018); Esophageal manometry (N/A, 04/05/2019); Video bronchoscopy (Bilateral, 07/14/2019); and Open reduction internal fixation (orif) distal radial fracture (Right, 04/10/2020). Family: family history includes Alzheimer's disease in her father; Hypertension in her mother.  Laboratory Chemistry Profile   Renal Lab Results  Component Value Date   BUN 20 01/04/2016   CREATININE 0.76 01/04/2016   GFRAA >60 01/04/2016   GFRNONAA >60 01/04/2016    Hepatic Lab Results  Component Value Date   AST 33 01/04/2016   ALT 26 01/04/2016   ALBUMIN 4.2 01/04/2016   ALKPHOS 97 01/04/2016   AMYLASE 30 02/27/2010   LIPASE 22 10/12/2012    Electrolytes Lab Results  Component Value Date   NA 139 01/04/2016   K 4.6 01/04/2016   CL 99 (L) 01/04/2016   CALCIUM 9.7 01/04/2016   MG 2.1 01/04/2016    Bone Lab Results  Component Value Date   25OHVITD1 49 01/04/2016   25OHVITD2 25 01/04/2016   25OHVITD3 24 01/04/2016    Inflammation (CRP: Acute Phase) (ESR: Chronic Phase) Lab Results  Component Value Date   CRP <0.5 01/04/2016    ESRSEDRATE 17 01/04/2016         Note: Above Lab results reviewed.  Recent Imaging Review  DG C-Arm 1-60 Min CLINICAL DATA:  ORIF RIGHT wrist  EXAM: RIGHT WRIST - COMPLETE 3+ VIEW; DG C-ARM 1-60 MIN  COMPARISON:  04/05/2020  FLUOROSCOPY TIME:  0 minutes 30 seconds  Dose: 0.84 mGy  Images: 3  FINDINGS: Dorsal plate and multiple screws identified at the distal RIGHT radius post ORIF of previously seen distal radial metaphyseal fracture.  Displacement of fragments and dorsal tilt of distal radial articular surface appear improved since the previous exam.  Diffuse osseous demineralization.  No additional acute osseous findings.  IMPRESSION: Post ORIF distal RIGHT radius.  Electronically Signed   By: Elta Guadeloupe  Thornton Papas M.D.   On: 04/10/2020 15:25 DG Wrist Complete Right CLINICAL DATA:  ORIF RIGHT wrist  EXAM: RIGHT WRIST - COMPLETE 3+ VIEW; DG C-ARM 1-60 MIN  COMPARISON:  04/05/2020  FLUOROSCOPY TIME:  0 minutes 30 seconds  Dose: 0.84 mGy  Images: 3  FINDINGS: Dorsal plate and multiple screws identified at the distal RIGHT radius post ORIF of previously seen distal radial metaphyseal fracture.  Displacement of fragments and dorsal tilt of distal radial articular surface appear improved since the previous exam.  Diffuse osseous demineralization.  No additional acute osseous findings.  IMPRESSION: Post ORIF distal RIGHT radius.  Electronically Signed   By: Lavonia Dana M.D.   On: 04/10/2020 15:25 Note: Reviewed        Physical Exam  General appearance: Well nourished, well developed, and well hydrated. In no apparent acute distress Mental status: Alert, oriented x 3 (person, place, & time)       Respiratory: No evidence of acute respiratory distress Eyes: PERLA Vitals: BP 135/60   Pulse 71   Temp (!) 96.9 F (36.1 C)   Ht _0  (1.651 m)   Wt 219 lb (99.3 kg)   SpO2 99%   BMI 36.44 kg/m  BMI: Estimated body mass index is 36.44 kg/m as  calculated from the following:   Height as of this encounter: _1  (1.651 m).   Weight as of this encounter: 219 lb (99.3 kg). Ideal: Ideal body weight: 57 kg (125 lb 10.6 oz) Adjusted ideal body weight: 73.9 kg (163 lb)  Assessment   Status Diagnosis  Controlled Controlled Controlled 1. Chronic pain syndrome   2. Opioid use with withdrawal ([place])   3. Generalized pain   4. Physical tolerance to opiate drug   5. Drug-induced insomnia ([place])   6. Neurogenic pain   7. Neuropathic pain   8. History of rib fracture (4th and 8th rib) (Left)   9. Left upper quadrant pain      Updated Problems: Problem  Generalized Pain  Epigastric Pain  Left Upper Quadrant Pain  Opioid Use With Withdrawal ([facility])  Physical Tolerance to Opiate Drug  Age-Related Osteoporosis Without Current Pathological Fracture  Amnesia  Dysphagia  Hypervitaminosis D  Other Allergy Status, Other Than to Drugs and Biological Substances  Vitamin D Deficiency  Esophageal Dysmotility  Constipation  Cough, Unspecified  Acne  Anxiety  Bronchiolectasis ([facility])  Family History of Malignant Neoplasm of Gastrointestinal Tract  Iron Deficiency Anemia  Postgastric Surgery Syndrome  Recurrent Major Depression in Remission ([facility])  Cough  Alopecia  Bronchiectasis Without Complication ([facility])   [id number] chest high-res-negative for interstitial lung disease, minimal air trapping, probable small left adrenal adenoma, bronchiectasis and volume loss in right middle lobe   Abnormal Cxr  Chronic Cough   Onset ? Around 2009 prev eval by Dr Joya Gaskins better off ACEi  - Allergy eval done early [place] for rhinitis and recurrent sinusitis pos only dust > avoidance. -Spirometry 06/03/18 nl  - HRCT chest 06/22/18 1. Negative for interstitial lung disease. 2. Minimal air trapping, indicative of small airways disease - Dg UGI 04/26/19 1. Esophageal dysmotility. 2. 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 Fob 07/14/19  Laryngeal edema.  - Hyperplastic changes were seen in the larynx.   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 > did not take any meds for it "  I don't have any nasal symptoms"  - 07/07/2020 rec titrate gabapentin to max of 1200 mg per day/ added 1st gen H1 blockers per guidelines  And referred to Dr Joya Gaskins at [facility]:  No problem-specific Assessment & Plan notes found for this encounter.  Ms. Latiana Tomei has a current medication list which includes the following long-term medication(s): gabapentin, clonidine, diclofenac sodium, gabapentin, gabapentin, loratadine, morphine, oxycodone, [START ON 03/30/2021] oxycodone, and [START ON 04/06/2021] oxycodone.  Pharmacotherapy (Medications Ordered): Meds ordered this encounter  Medications   ketorolac (TORADOL) injection 60 mg   orphenadrine (NORFLEX) injection 60 mg   gabapentin (NEURONTIN) 800 MG tablet    Sig: Take 1 tablet (800 mg total) by mouth at bedtime.    Dispense:  30 tablet    Refill:  0    Fill one day early if pharmacy is closed on scheduled refill date. May substitute for generic if available.    Orders:  Orders Placed This Encounter  Procedures   Cytochrome P450 2D6/2C19    Order Specific Question:   Release to patient    Answer:   Immediate    Follow-up plan:   Return for scheduled encounter.     Interventional management options:  Considering:   Diagnostic left costochondral joint injection  Possible left intercostal nerve RFA  Therapeutic Left greater occipital nerve RFA  Diagnostic left C2 + TON nerve block  Possible left C2 + TON RFA    Palliative PRN treatment(s):   Palliative left intercostal nerve blocks (T5 to T8) #2  Palliative left GONB block #3  Palliative bilateral sacroiliac joint block #2      Recent Visits Date Type  Provider Dept  03/07/21 Office Visit [name], MD [facility]  Showing recent visits within past 90 days and meeting all other requirements Today's Visits Date Type Provider Dept  03/28/21 Office Visit [name], MD [facility]  Showing today's visits and meeting all other requirements Future Appointments Date Type Provider Dept  04/30/21 Appointment [name], MD [facility]  Showing future appointments within next 90 days and meeting all other requirements I discussed the assessment and treatment plan with the patient. The patient was provided an opportunity to ask questions and all were answered. The patient agreed with the plan and demonstrated an understanding of the instructions.  Patient advised to call back or seek an in-person evaluation if the symptoms or condition worsens.  Duration of encounter: 29 minutes.  Note by: Gaspar Cola, MD Date: 03/28/2021; Time: 1:22 PM

## 2021-04-03 ENCOUNTER — Telehealth: Payer: Self-pay | Admitting: Pain Medicine

## 2021-04-03 ENCOUNTER — Other Ambulatory Visit: Payer: Self-pay | Admitting: Pain Medicine

## 2021-04-03 DIAGNOSIS — G8929 Other chronic pain: Secondary | ICD-10-CM

## 2021-04-03 DIAGNOSIS — G894 Chronic pain syndrome: Secondary | ICD-10-CM

## 2021-04-03 DIAGNOSIS — R109 Unspecified abdominal pain: Secondary | ICD-10-CM

## 2021-04-03 MED ORDER — PREDNISONE 20 MG PO TABS: ORAL_TABLET | ORAL | 0 refills | Status: DC

## 2021-04-03 NOTE — Telephone Encounter (Signed)
Bubble message sent to Dr. Naveira. 

## 2021-04-03 NOTE — Telephone Encounter (Signed)
Let her know that I just sent a prescription to her pharmacy for a steroid taper that she can take to help bring down some of the discomfort.  Please assess for me whether the symptoms that she is currently having are those of withdrawals or just pain.  Remind her to continue taking the Neurontin 800 mg p.o. at bedtime along with her regular Neurontin dose.  Make sure that she is also taking the melatonin.  If she is having any withdrawal symptoms please let me know so that I can resend another prescription for the clonidine.  For now, continue to give her reassurance.

## 2021-04-03 NOTE — Telephone Encounter (Signed)
Please explain to the patient that the "Drug Holiday" is not an event that "is not working".  Explained to the patient that a drug holiday is a process that we expect is going to be uncomfortable, but once we started, it will be completed.  We will not stop the process midway since he will only worsen things.  Since we started the drug holiday she has been calling, just about every day.  Please concentrate on being supportive and letting her know that she needs to continue taking the medications as prescribed.  The drug holiday should be complete by 04/29/2021.  Meanwhile, if she is having a lot of discomfort, you can bring her in and do a Toradol/Norflex IM injection.

## 2021-04-03 NOTE — Telephone Encounter (Signed)
The previous notes are Dr. Adalberto Cole response. I informed patient of this, she will try the steroid taper for now, then the Toradol/Norflex if needed.

## 2021-04-03 NOTE — Progress Notes (Signed)
The patient has been calling every couple days since we started with the "Drug Holiday".  Despite the fact that she was the one that asked to have a "Drug Holiday" done, I do not think that she understood the concept.  She is having increased pain, but we have been providing her with additional prescriptions for Neurontin and she has been complaining of insomnia, but opioid use should have never been taken for that.  She has been provided with melatonin and the at bedtime dose for the gabapentin was increased to 800 mg.  She has complained of several things but as of now, she has not really had any true withdrawal symptoms.  I did provide her with a prescription for clonidine to take at bedtime, just in case she was to have some.  At this point, we do need to continue providing her with some reassurance that this is a temporary process and although uncomfortable, it should be very beneficial in the long run.  Today I will call in a prescription for a steroid taper and I have also offered to bring the patient in for an IM injection of Toradol/Norflex.  However, when she came in the other day, complaining in a similar manner, my assessment indicated that she is probably having more anxiety than anything else.  She is having some discomfort in the area of the left flank, but none of this seems to be new.

## 2021-04-04 ENCOUNTER — Encounter: Payer: Self-pay | Admitting: Pain Medicine

## 2021-04-04 ENCOUNTER — Telehealth: Payer: Self-pay

## 2021-04-04 ENCOUNTER — Emergency Department (HOSPITAL_COMMUNITY): Payer: Medicare Other

## 2021-04-04 ENCOUNTER — Emergency Department (HOSPITAL_COMMUNITY)
Admission: EM | Admit: 2021-04-04 | Discharge: 2021-04-05 | Disposition: A | Discharge status: Home / Self Care | Payer: Medicare Other | Attending: Emergency Medicine | Admitting: Emergency Medicine

## 2021-04-04 ENCOUNTER — Other Ambulatory Visit: Payer: Self-pay

## 2021-04-04 ENCOUNTER — Ambulatory Visit: Payer: Medicare Other | Attending: Pain Medicine | Admitting: Pain Medicine

## 2021-04-04 VITALS — BP 150/79 | HR 88 | Temp 97.2°F | Resp 16 | Ht 65.0 in | Wt 218.0 lb

## 2021-04-04 DIAGNOSIS — G8929 Other chronic pain: Secondary | ICD-10-CM

## 2021-04-04 DIAGNOSIS — R52 Pain, unspecified: Secondary | ICD-10-CM

## 2021-04-04 DIAGNOSIS — G4440 Drug-induced headache, not elsewhere classified, not intractable: Secondary | ICD-10-CM | POA: Diagnosis not present

## 2021-04-04 DIAGNOSIS — R519 Headache, unspecified: Secondary | ICD-10-CM | POA: Insufficient documentation

## 2021-04-04 DIAGNOSIS — F22 Delusional disorders: Secondary | ICD-10-CM | POA: Insufficient documentation

## 2021-04-04 DIAGNOSIS — R109 Unspecified abdominal pain: Secondary | ICD-10-CM | POA: Diagnosis not present

## 2021-04-04 DIAGNOSIS — F1123 Opioid dependence with withdrawal: Secondary | ICD-10-CM | POA: Diagnosis not present

## 2021-04-04 DIAGNOSIS — F1193 Opioid use, unspecified with withdrawal: Secondary | ICD-10-CM

## 2021-04-04 DIAGNOSIS — R410 Disorientation, unspecified: Secondary | ICD-10-CM

## 2021-04-04 DIAGNOSIS — Z889 Allergy status to unspecified drugs, medicaments and biological substances status: Secondary | ICD-10-CM | POA: Diagnosis not present

## 2021-04-04 DIAGNOSIS — I10 Essential (primary) hypertension: Secondary | ICD-10-CM | POA: Insufficient documentation

## 2021-04-04 LAB — CBC WITH DIFFERENTIAL/PLATELET
Abs Immature Granulocytes: 0.03 10*3/uL (ref 0.00–0.07)
Basophils Absolute: 0 10*3/uL (ref 0.0–0.1)
Basophils Relative: 0 %
Eosinophils Absolute: 0 10*3/uL (ref 0.0–0.5)
Eosinophils Relative: 0 %
HCT: 46.3 % — ABNORMAL HIGH (ref 36.0–46.0)
Hemoglobin: 15 g/dL (ref 12.0–15.0)
Immature Granulocytes: 0 %
Lymphocytes Relative: 23 %
Lymphs Abs: 2 10*3/uL (ref 0.7–4.0)
MCH: 28.7 pg (ref 26.0–34.0)
MCHC: 32.4 g/dL (ref 30.0–36.0)
MCV: 88.5 fL (ref 80.0–100.0)
Monocytes Absolute: 0.8 10*3/uL (ref 0.1–1.0)
Monocytes Relative: 9 %
Neutro Abs: 5.8 10*3/uL (ref 1.7–7.7)
Neutrophils Relative %: 68 %
Platelets: 286 10*3/uL (ref 150–400)
RBC: 5.23 MIL/uL — ABNORMAL HIGH (ref 3.87–5.11)
RDW: 14.5 % (ref 11.5–15.5)
WBC: 8.6 10*3/uL (ref 4.0–10.5)
nRBC: 0 % (ref 0.0–0.2)

## 2021-04-04 LAB — COMPREHENSIVE METABOLIC PANEL
ALT: 30 U/L (ref 0–44)
AST: 29 U/L (ref 15–41)
Albumin: 4.6 g/dL (ref 3.5–5.0)
Alkaline Phosphatase: 89 U/L (ref 38–126)
Anion gap: 10 (ref 5–15)
BUN: 20 mg/dL (ref 8–23)
CO2: 27 mmol/L (ref 22–32)
Calcium: 10 mg/dL (ref 8.9–10.3)
Chloride: 103 mmol/L (ref 98–111)
Creatinine, Ser: 0.97 mg/dL (ref 0.44–1.00)
GFR, Estimated: >60 mL/min (ref >60)
Glucose, Bld: 132 mg/dL — ABNORMAL HIGH (ref 70–99)
Potassium: 4.2 mmol/L (ref 3.5–5.1)
Sodium: 140 mmol/L (ref 135–145)
Total Bilirubin: 1.2 mg/dL (ref 0.3–1.2)
Total Protein: 8 g/dL (ref 6.5–8.1)

## 2021-04-04 LAB — URINALYSIS, ROUTINE W REFLEX MICROSCOPIC
Bilirubin Urine: NEGATIVE
Glucose, UA: NEGATIVE mg/dL
Hgb urine dipstick: NEGATIVE
Ketones, ur: 5 mg/dL — AB
Nitrite: NEGATIVE
Protein, ur: 30 mg/dL — AB
Specific Gravity, Urine: 1.028 (ref 1.005–1.030)
pH: 5 (ref 5.0–8.0)

## 2021-04-04 MED ORDER — CLONIDINE HCL 0.1 MG PO TABS
0.1000 mg | ORAL_TABLET | Freq: Once | ORAL | Status: AC
  Administered 2021-04-04: 0.1 mg via ORAL
  Filled 2021-04-04: qty 1

## 2021-04-04 MED ORDER — SODIUM CHLORIDE 0.9 % IV BOLUS
1000.0000 mL | Freq: Once | INTRAVENOUS | Status: AC
  Administered 2021-04-04: 1000 mL via INTRAVENOUS

## 2021-04-04 MED ORDER — ORPHENADRINE CITRATE 30 MG/ML IJ SOLN
60.0000 mg | Freq: Once | INTRAMUSCULAR | Status: AC
  Administered 2021-04-04: 60 mg via INTRAMUSCULAR
  Filled 2021-04-04: qty 2

## 2021-04-04 MED ORDER — LORAZEPAM 2 MG/ML IJ SOLN
1.0000 mg | Freq: Once | INTRAMUSCULAR | Status: AC
  Administered 2021-04-04: 1 mg via INTRAVENOUS
  Filled 2021-04-04: qty 1

## 2021-04-04 MED ORDER — KETOROLAC TROMETHAMINE 60 MG/2ML IM SOLN
60.0000 mg | Freq: Once | INTRAMUSCULAR | Status: AC
  Administered 2021-04-04: 60 mg via INTRAMUSCULAR
  Filled 2021-04-04: qty 2

## 2021-04-04 MED ORDER — DROPERIDOL 2.5 MG/ML IJ SOLN
2.5000 mg | Freq: Once | INTRAMUSCULAR | Status: AC
  Administered 2021-04-04: 2.5 mg via INTRAVENOUS
  Filled 2021-04-04: qty 2

## 2021-04-04 MED ORDER — HYDROMORPHONE HCL 1 MG/ML IJ SOLN
1.0000 mg | Freq: Once | INTRAMUSCULAR | Status: AC
  Administered 2021-04-04: 1 mg via INTRAVENOUS
  Filled 2021-04-04: qty 1

## 2021-04-04 NOTE — Telephone Encounter (Signed)
Pt states she is having pain her head it have been bothering her every since she started the Prednisone medication yesterday her skin was crawling for 4 hours, she have taken medication for a headache and nothing seems to help her please reach out to pt thanks

## 2021-04-04 NOTE — ED Provider Notes (Signed)
Emergency Medicine Provider Triage Evaluation Note  Kari Gibbs , a 70 y.o. female  was evaluated in triage.  Pt complains of skin crawling, HA and HTN. Started drug holiday recently - is on week 3 of titration downward from normal dose of chronic pain opioids from prior gastric bypass. Headache is severe, constant.   Review of Systems  Positive: HA, HTN, skin crawling Negative: CP, SOB  Physical Exam  BP (!) 156/101 (BP Location: Left Arm)   Pulse 79   Temp (!) 97.5 F (36.4 C) (Oral)   Resp 20   Ht '5\' 5"'$  (1.651 m)   Wt 98.9 kg   SpO2 97%   BMI 36.28 kg/m  Gen:   Awake, no distress   Resp:  Normal effort  MSK:   Moves extremities without difficulty  Other:    Medical Decision Making  Medically screening exam initiated at 7:10 PM.  Appropriate orders placed.  Kari Gibbs was informed that the remainder of the evaluation will be completed by another provider, this initial triage assessment does not replace that evaluation, and the importance of remaining in the ED until their evaluation is complete.     Sherrill Raring, PA-C 04/04/21 1911    Carmin Muskrat, MD 04/05/21 Dyann Kief

## 2021-04-04 NOTE — ED Triage Notes (Signed)
Pt states that she is on the fourth week of a drug holiday and was seen today the pain clinic today and given IM injections for pain. States that since then she has had generalized headache, one episode of emesis, hypertension (160/90 @ home), and feeling of "skin crawling" after the injection.

## 2021-04-04 NOTE — Progress Notes (Signed)
Patient: Kari Gibbs  Service Category: E/M  Provider: Gaspar Cola, MD  DOB: 1951-06-24  DOS: 04/04/2021  Referring Provider: Cari Caraway, MD  MRN: [id number]  Setting: Ambulatory outpatient  PCP: Cari Caraway, MD  Type: Established Patient  Specialty: Interventional Pain Management    Location: Office  Delivery: Face-to-face     Purpose:  The patient comes in today for IM therapy. Case was discussed with attending physician.    Subjective:  [name] is a 70 y.o. year old, female patient, who comes today for a nurse visit complaining of Headache Her last contact with [place] was on 04/04/2021. Severity of the pain is described as a 9 /10.     Chauncey Fischer, RN  04/04/2021 11:12 AM  Sign when Signing Visit Safety precautions to be maintained throughout the outpatient stay will include: orient to surroundings, keep bed in low position, maintain call bell within reach at all times, provide assistance with transfer out of bed and ambulation.        Objective:  [name]  height is '5\' 5"'$  (1.651 m) and weight is 218 lb (98.9 kg). Her temporal temperature is 97.2 F (36.2 C) (abnormal). Her blood pressure is 150/79 (abnormal) and her pulse is 88. Her respiration is 16 and oxygen saturation is 98%.  Body mass index is 36.28 kg/m.  Analgesic:  MS Contin 30 mg, 1 tab PO q 12 hrs (60 mg/day of morphine) MME/day: 60 mg/day.     Allergies:  Patient is allergic to iohexol, ace inhibitors, losartan potassium, and neosporin [neomycin-bacitracin zn-polymyx].    Labs:  Lab Results  Component Value Date   BUN 20 01/04/2016   CREATININE 0.76 01/04/2016   GFRAA >60 01/04/2016   GFRNONAA >60 01/04/2016      Assessment:  The primary encounter diagnosis was Chronic flank pain (Primary Area of Pain) (Left). Diagnoses of Generalized pain and Drug-induced headache, not elsewhere classified, not intractable were also pertinent to this visit.    '@FNN23'$ @    Plan of Care   Orders:  No orders of the defined types were placed in this encounter.  Chronic Opioid Analgesic:   MS Contin 30 mg, 1 tab PO q 12 hrs (60 mg/day of morphine) MME/day: 60 mg/day.    Medications ordered for procedure: Meds ordered this encounter  Medications   ketorolac (TORADOL) injection 60 mg   orphenadrine (NORFLEX) injection 60 mg    Medications administered: We administered ketorolac and orphenadrine.  See the medical record for exact dosing, route, and time of administration.  Follow-up plan:   Return for scheduled encounter.     Interventional management options:  Considering:   Diagnostic left costochondral joint injection  Possible left intercostal nerve RFA  Therapeutic Left greater occipital nerve RFA  Diagnostic left C2 + TON nerve block  Possible left C2 + TON RFA    Palliative PRN treatment(s):   Palliative left intercostal nerve blocks (T5 to T8) #2  Palliative left GONB block #3  Palliative bilateral sacroiliac joint block #2      Recent Visits Date Type Provider Dept  03/28/21 Office Visit [name], MD [facility]  03/07/21 Office Visit [name], MD [facility]  Showing recent visits within past 90 days and meeting all other requirements Today's Visits Date Type Provider Dept  04/04/21 Office Visit [name], MD [facility]  Showing today's visits and meeting all other requirements Future Appointments Date Type Provider Dept  04/30/21 Appointment Dossie Arbour,  Beatriz Chancellor, MD [facility]  Showing future appointments within next 90 days and meeting all other requirements Disposition: Discharge home  Discharge (Date  Time): 04/04/2021; 1133 hrs.   Primary Care Physician: Cari Caraway, MD Location: [facility] Note by: Gaspar Cola, MD Date: 04/04/2021; Time: 11:45 AM  Disclaimer:  Medicine is not an Chief Strategy Officer. The only guarantee in medicine  is that nothing is guaranteed. It is important to note that the decision to proceed with this intervention was based on the information collected from the patient. The Data and conclusions were drawn from the patient's questionnaire, the interview, and the physical examination. Because the information was provided in large part by the patient, it cannot be guaranteed that it has not been purposely or unconsciously manipulated. Every effort has been made to obtain as much relevant data as possible for this evaluation. It is important to note that the conclusions that lead to this procedure are derived in large part from the available data. Always take into account that the treatment will also be dependent on availability of resources and existing treatment guidelines, considered by other Pain Management Practitioners as being common knowledge and practice, at the time of the intervention. For Medico-Legal purposes, it is also important to point out that variation in procedural techniques and pharmacological choices are the acceptable norm. The indications, contraindications, technique, and results of the above procedure should only be interpreted and judged by a Board-Certified Interventional Pain Specialist with extensive familiarity and expertise in the same exact procedure and technique.

## 2021-04-04 NOTE — Progress Notes (Signed)
Safety precautions to be maintained throughout the outpatient stay will include: orient to surroundings, keep bed in low position, maintain call bell within reach at all times, provide assistance with transfer out of bed and ambulation.  

## 2021-04-04 NOTE — ED Notes (Signed)
Pt states she has been taken off of her Morphine for just over 4 weeks now and is to be taken off of her gabapentin for two weeks after.  Pt received tramadol and toradol at the clinic today without relief.

## 2021-04-04 NOTE — Telephone Encounter (Signed)
Spoke with Dr. Dossie Arbour. Advise patient to stop Prednisone. Patient advised to do so. Also offered IM Toradol/Norflex. Patient declined at this time.

## 2021-04-04 NOTE — ED Notes (Signed)
Spoke to spouse and told him the medication should work pretty quickly, pt relaxed and this nurse stated "looks like it's already working", pt responded "yep."  Provider notified

## 2021-04-04 NOTE — ED Provider Notes (Signed)
[facility] DEPT Provider Note   CSN: [id number] Arrival date & time: 04/04/21  1840     History Chief Complaint  Patient presents with   Headache   Hypertension    Kari Gibbs is a 70 y.o. female.  HPI Patient presents with her husband who assists with the history.  The patient has multiple medical issues including chronic pain syndrome.  She embarked on a" drug holiday", trying to wean herself from narcotics, but has recently developed headache, shakiness, diaphoresis, and today after going to her pain management clinic, receiving Toradol, muscle relaxer injections, she developed worsening head pain, generalized discomfort and pruritus. No relief with anything, including Benadryl which she received at walk-in urgent care just prior to ED arrival. No overt confusion, difficulty speaking, the patient denies weakness in any extremity, vision changes, vomiting, diarrhea.    Per pain management clinic note yesterday: Kari Pointer, MD at 04/03/2021 10:40 AM  Status: Signed  The patient has been calling every couple days since we started with the "Drug Holiday".  Despite the fact that she was the one that asked to have a "Drug Holiday" done, I do not think that she understood the concept.  She is having increased pain, but we have been providing her with additional prescriptions for Neurontin and she has been complaining of insomnia, but opioid use should have never been taken for that.  She has been provided with melatonin and the at bedtime dose for the gabapentin was increased to 800 mg.  She has complained of several things but as of now, she has not really had any true withdrawal symptoms.  I did provide her with a prescription for clonidine to take at bedtime, just in case she was to have some.  At this point, we do need to continue providing her with some reassurance that this is a temporary process and although uncomfortable, it should be very  beneficial in the long run.  Today I will call in a prescription for a steroid taper and I have also offered to bring the patient in for an IM injection of Toradol/Norflex.  However, when she came in the other day, complaining in a similar manner, my assessment indicated that she is probably having more anxiety than anything else.  She is having some discomfort in the area of the left flank, but none of this seems to be new.      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   Fatigue    Gastritis    Distal gastritis   GERD (gastroesophageal reflux disease)    "told that she does"   Headache(784.0)    HX OF MIGRAINES   Hiatal hernia 09/2009   History of kidney stones    History of rib fracture (Left 4th and 8th rib) 06/28/2015   Hypertension    IBS (irritable bowel syndrome)    Nausea & vomiting    OSTEOARTHRITIS 05/01/2010   Qualifier: Diagnosis of  By: Megan Salon MD, John     Pneumonia    WHEN PT IN 6 TH GRADE   SBO (small bowel obstruction) ([place]) 2013   Varicose veins     Patient Active Problem List   Diagnosis Date Noted  Headache 04/04/2021   Cough, unspecified 03/28/2021   Opioid use with withdrawal ([place]) 03/28/2021   Generalized pain 03/28/2021   Physical tolerance to opiate drug 03/28/2021   Opioid withdrawal ([place]) 03/20/2021   Insomnia 03/20/2021   Chronic use of opiate for therapeutic purpose 12/06/2020   Acne 09/04/2020   Age-related osteoporosis without current pathological fracture 09/04/2020   Amnesia 09/04/2020   Anxiety 09/04/2020   Bronchiolectasis ([place]) 09/04/2020   Chronic postoperative pain 09/04/2020   Dysphagia 09/04/2020   Family history of malignant neoplasm of gastrointestinal tract 09/04/2020   Hypervitaminosis D 09/04/2020   Iron deficiency anemia  09/04/2020   Epigastric pain 09/04/2020   Left upper quadrant pain 09/04/2020   Other allergy status, other than to drugs and biological substances 09/04/2020   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   Recurrent major depression in remission ([place]) 09/04/2020   Vitamin D deficiency 09/04/2020   Esophageal dysmotility 09/04/2020   Constipation 0000000   Uncomplicated opioid dependence ([place]) 08/31/2020   Cough 08/29/2020   Alopecia 05/25/2019   Pharmacologic therapy 02/24/2019   Disorder of skeletal system 02/24/2019   Problems influencing health status 02/24/2019   Bronchiectasis without complication ([place]) 99991111   Abnormal CXR 06/09/2018   Chronic cough 06/09/2018   History Allergy to Contrast 04/02/2018   Other specified dorsopathies, sacral and sacrococcygeal region 04/02/2018   Chronic sacroiliac joint somatic dysfunction (Bilateral) 04/02/2018   Chronic low back pain (Bilateral) w/ sciatica (Bilateral) 03/25/2018   Chronic sacroiliac joint pain (Bilateral) 03/25/2018   Acute costochondritis (Left) 01/21/2017   Obesity 01/21/2017   Chronic low back pain (Bilateral) w/o sciatica 09/12/2016   Cervical spondylosis 07/17/2016   Muscle twitching 06/12/2016   Muscle spasticity 05/20/2016   Tremors of nervous system 05/20/2016   Chronic neck pain (Bilateral) (L>R) 03/27/2016   Cervical facet syndrome (Bilateral) (L>R) 03/27/2016   Varicose veins of left lower extremity with complications 99991111   Varicose veins of bilateral lower extremities with other complications 0000000   Long term current use of opiate analgesic 06/28/2015   Long term prescription opiate use 06/28/2015   Opiate use (60 MME/day) 06/28/2015   Encounter for therapeutic drug level monitoring 06/28/2015   Opioid dependence ([place]) 06/28/2015   Encounter for chronic pain management 06/28/2015   Chronic pain syndrome 06/28/2015   Chronic flank pain (Primary Area of Pain) (Left) 06/28/2015   Chronic  thoracic radicular pain (Left-sided) 06/28/2015   Opioid-induced constipation (OIC) 06/28/2015   Chronic occipital neuralgia (Left) 06/28/2015   Neuropathic pain 06/28/2015   Neurogenic pain 06/28/2015   History of rib fracture (4th and 8th rib) (Left) 06/28/2015   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   Metatarsalgia of foot (Right) 06/28/2015   Chronic abdominal pain (Bilateral) (L>R) (possible left-sided thoracic radiculopathy) 06/28/2015   Primary osteoarthritis 06/28/2015   Return to work evaluation (FCE: Medium) 06/28/2015   Fracture of rib (4th and 8th rib) (Left) 10/28/2013   S/P laparoscopic cholecystectomy Oct 2014 05/18/2013   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 03/24/2012   Depression 05/01/2010   Essential hypertension 05/01/2010   Gastro-esophageal reflux disease without esophagitis 05/01/2010   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   Allergic rhinitis 06/26/2007    Past Surgical History:  Procedure Laterality Date   ABDOMINAL HYSTERECTOMY  1988   partial   CHOLECYSTECTOMY N/A 05/18/2013   Procedure: LAPAROSCOPIC CHOLECYSTECTOMY WITH INTRAOPERATIVE CHOLANGIOGRAM/laparoscopy/ ;  Surgeon: Pedro Earls, MD;  Location: [facility];  Service: General;  Laterality: N/A;  ENDOVENOUS ABLATION SAPHENOUS VEIN W/ LASER Left 02/04/2018   endovenous laser ablation left greater saphenous vein by Tinnie Gens MD    ESOPHAGEAL MANOMETRY N/A 04/05/2019   Procedure: ESOPHAGEAL MANOMETRY (EM);  Surgeon: Wilford Corner, MD;  Location: [facility];  Service: Endoscopy;  Laterality: N/A;   EXPLORATORY LAPAROTOMY  2012   GASTRIC BYPASS  11/13/2009   HERNIA REPAIR  2011   hiatal   LAPAROSCOPY  03/04/2012   Procedure: LAPAROSCOPY DIAGNOSTIC;  Surgeon: Pedro Earls, MD;  Location: [facility];  Service: General;  Laterality: N/A;  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   OPEN REDUCTION INTERNAL FIXATION (ORIF) DISTAL RADIAL FRACTURE Right 04/10/2020   Procedure:  OPEN TREATMENT OF LEFT DISTAL RADIUS FRACTURE;  Surgeon: Milly Jakob, MD;  Location: [facility];  Service: Orthopedics;  Laterality: Right;  LENGTH OF SURGERY: 90 MIN PRE OP BLOCK   SHOULDER SURGERY Right 06/16/2017   torn rotator cuff and rebuilding of another section, removed some spurs   TMJ- LEFT--ABOUT 10 YRS AGO--LEFT SIDE--PT NOW HAS SOME PAIN LEFT JAW Left    still has pain, scar tissue has been removed   VIDEO BRONCHOSCOPY Bilateral 07/14/2019   Procedure: VIDEO BRONCHOSCOPY WITHOUT FLUORO;  Surgeon: Collene Gobble, MD;  Location: [facility];  Service: Cardiopulmonary;  Laterality: Bilateral;     OB History   No obstetric history on file.     Family History  Problem Relation Age of Onset   Hypertension Mother    Alzheimer's disease Father     Social History   Tobacco Use   Smoking status: Never   Smokeless tobacco: Never  Substance Use Topics   Alcohol use: Not Currently    Alcohol/week: 0.0 standard drinks   Drug use: No    Home Medications Prior to Admission medications   Medication Sig Start Date End Date Taking? Authorizing Provider  acetaminophen (TYLENOL) 325 MG tablet Take 2 tablets (650 mg total) by mouth every 6 (six) hours. 04/10/20   Milly Jakob, MD  azelastine (ASTELIN) 0.1 % nasal spray Place 1 spray into both nostrils daily.    [provider]  benzonatate (TESSALON) 200 MG capsule Take 1 capsule (200 mg total) by mouth 3 (three) times daily as needed for cough. 09/28/20   Tanda Rockers, MD  Biotin 1000 MCG CHEW Chew 2,500 mcg by mouth.    [provider]  busPIRone (BUSPAR) 15 MG tablet Take 7.5 mg by mouth 2 (two) times daily.  03/18/17   [provider]  Cholecalciferol (VITAMIN D3) LIQD Take 2 drops by mouth daily.     [provider]  cloNIDine (CATAPRES) 0.1 MG tablet Take 1 tablet (0.1 mg total) by mouth at bedtime for 14 days. Discontinue his orthostatic hypotension is experienced.  03/20/21 04/03/21  Kari Pointer, MD  conjugated estrogens (PREMARIN) vaginal cream Place 1 Applicatorful vaginally daily.    [provider]  Cranberry 1000 MG CAPS Take 1 capsule by mouth daily.    [provider]  diclofenac Sodium (VOLTAREN) 1 % GEL Apply 4 g topically 4 (four) times daily as needed. 06/13/20 03/07/21  Kari Pointer, MD  esomeprazole (NEXIUM) 20 MG capsule Take 40 mg by mouth 2 (two) times daily before a meal.     [provider]  gabapentin (NEURONTIN) 100 MG capsule Take 1 capsule (100 mg total) by mouth 3 (three) times daily. Build up to taking 100 mg 1-2 at lunch and supper Patient taking differently: Take  100 mg by mouth 2 (two) times daily. Uses once to twice per week prn 07/07/20   Tanda Rockers, MD  gabapentin (NEURONTIN) 400 MG capsule Take 1-2 capsules (400-800 mg total) by mouth 2 (two) times daily. 06/13/20 03/07/21  Kari Pointer, MD  gabapentin (NEURONTIN) 800 MG tablet Take 1 tablet (800 mg total) by mouth at bedtime. 03/28/21 04/27/21  Kari Pointer, MD  ibuprofen (ADVIL) 200 MG tablet Take 3 tablets (600 mg total) by mouth every 6 (six) hours. 04/10/20   Milly Jakob, MD  irbesartan (AVAPRO) 75 MG tablet Take 75 mg by mouth daily.    [provider]  Iron-Vitamin C (VITRON-C) 65-125 MG TABS Take 1 tablet by mouth daily.    [provider]  loratadine (CLARITIN) 10 MG tablet Take 10 mg by mouth daily.    [provider]  LORazepam (ATIVAN) 0.5 MG tablet Take 0.5 mg by mouth 2 (two) times daily as needed. 05/12/20   [provider]  Magnesium 500 MG TABS Take 500 mg by mouth 2 (two) times daily.     [provider]  Melatonin 10 MG CAPS Take 20 mg by mouth at bedtime as needed. 03/20/21 04/19/21  Kari Pointer, MD  montelukast (SINGULAIR) 10 MG tablet Take 10 mg by mouth at bedtime.    [provider]  morphine (MSIR) 15 MG tablet Take 1 tablet (15 mg total) by mouth  every 8 (eight) hours for 7 days, THEN 1 tablet (15 mg total) in the morning and at bedtime for 7 days. Must last 30 days.. 03/10/21 03/24/21  Kari Pointer, MD  naloxone [facility]) 2 MG/2ML injection Inject 1 mL (1 mg total) into the muscle as needed for up to 2 doses (for pain medicine overdose). Always have available. Inject in thigh muscle in the event of respiratory depression 12/08/19   Kari Pointer, MD  oxyCODONE (OXY IR/ROXICODONE) 5 MG immediate release tablet Take 1 tablet (5 mg total) by mouth 3 (three) times daily for 7 days. Max: 3/day. Must last 7 days. 03/23/21 03/30/21  Kari Pointer, MD  oxyCODONE (OXY IR/ROXICODONE) 5 MG immediate release tablet Take 1 tablet (5 mg total) by mouth 2 (two) times daily for 7 days. Max: 2/day. Must last 7 days. 03/30/21 04/06/21  Kari Pointer, MD  oxyCODONE (OXY IR/ROXICODONE) 5 MG immediate release tablet Take 1 tablet (5 mg total) by mouth daily for 7 days. Max: 1/day. Must last 7 days. 04/06/21 04/13/21  Kari Pointer, MD  Peppermint Oil (PEPOGEST PO) Take 1 tablet by mouth daily as needed.    [provider]  potassium gluconate 595 (99 K) MG TABS tablet Take 595 mg by mouth.    [provider]  predniSONE (DELTASONE) 20 MG tablet Take 3 tablets (60 mg total) by mouth daily with breakfast for 5 days, THEN 2 tablets (40 mg total) daily with breakfast for 5 days, THEN 1 tablet (20 mg total) daily with breakfast for 5 days. 04/03/21 04/18/21  Kari Pointer, MD  Prenatal MV & Min w/FA-DHA (PRENATAL ADULT GUMMY/DHA/FA PO) Take by mouth daily.    [provider]  Probiotic Product (RA PROBIOTIC GUMMIES) CHEW Chew by mouth daily.    [provider]  promethazine-dextromethorphan (PROMETHAZINE-DM) 6.25-15 MG/5ML syrup Take 5 mLs by mouth 4 (four) times daily as needed for cough. 09/28/20   Tanda Rockers, MD  Respiratory Therapy Supplies (FLUTTER) DEVI 1 Device by Does not apply route as needed. 09/24/18    Wyn Quaker  P, NP    Allergies    Iohexol, Ace inhibitors, Losartan potassium, and Neosporin [neomycin-bacitracin zn-polymyx]  Review of Systems   Review of Systems  Constitutional:        Per HPI, otherwise negative  HENT:         Per HPI, otherwise negative  Respiratory:         Per HPI, otherwise negative  Cardiovascular:        Per HPI, otherwise negative  Gastrointestinal:  Negative for vomiting.  Endocrine:       Negative aside from HPI  Genitourinary:        Neg aside from HPI   Musculoskeletal:        Per HPI, otherwise negative  Skin: Negative.   Neurological:  Positive for headaches. Negative for syncope.   Physical Exam Updated Vital Signs BP (!) 156/101 (BP Location: Left Arm)   Pulse 79   Temp (!) 97.5 F (36.4 C) (Oral)   Resp 20   Ht '5\' 5"'$  (1.651 m)   Wt 98.9 kg   SpO2 97%   BMI 36.28 kg/m   Physical Exam Vitals and nursing note reviewed.  Constitutional:      General: She is not in acute distress.    Appearance: She is obese.  HENT:     Head: Normocephalic and atraumatic.  Eyes:     Conjunctiva/sclera: Conjunctivae normal.  Cardiovascular:     Rate and Rhythm: Normal rate and regular rhythm.  Pulmonary:     Effort: Pulmonary effort is normal. No respiratory distress.     Breath sounds: Normal breath sounds. No stridor.  Abdominal:     General: There is no distension.  Skin:    General: Skin is warm and dry.  Neurological:     Mental Status: She is alert and oriented to person, place, and time.     Cranial Nerves: No cranial nerve deficit.    ED Results / Procedures / Treatments   Labs (all labs ordered are listed, but only abnormal results are displayed) Labs Reviewed  URINALYSIS, ROUTINE W REFLEX MICROSCOPIC - Abnormal; Notable for the following components:      Result Value   APPearance HAZY (*)    Ketones, ur 5 (*)    Protein, ur 30 (*)    Leukocytes,Ua SMALL (*)    Bacteria, UA RARE (*)    All other components within normal  limits  COMPREHENSIVE METABOLIC PANEL - Abnormal; Notable for the following components:   Glucose, Bld 132 (*)    All other components within normal limits  CBC WITH DIFFERENTIAL/PLATELET - Abnormal; Notable for the following components:   RBC 5.23 (*)    HCT 46.3 (*)    All other components within normal limits    EKG None  Radiology CT HEAD WO CONTRAST (5MM)  Result Date: 04/04/2021 CLINICAL DATA:  Headaches and hypertension EXAM: CT HEAD WITHOUT CONTRAST TECHNIQUE: Contiguous axial images were obtained from the base of the skull through the vertex without intravenous contrast. COMPARISON:  07/01/2017 FINDINGS: Brain: No evidence of acute infarction, hemorrhage, hydrocephalus, extra-axial collection or mass lesion/mass effect. Vascular: No hyperdense vessel or unexpected calcification. Skull: Normal. Negative for fracture or focal lesion. Sinuses/Orbits: No acute finding. Other: None. IMPRESSION: No acute intracranial abnormality. Electronically Signed   By: Inez Catalina M.D.   On: 04/04/2021 19:32    Procedures Procedures   Medications Ordered in ED Medications - No data to display  ED Course  I have reviewed  the triage vital signs and the nursing notes.  Pertinent labs & imaging results that were available during my care of the patient were reviewed by me and considered in my medical decision making (see chart for details).  With broad differential of headache, itchiness, spasms, suspicion for withdrawal patient received meds, CT, labs ordered.  Update:, After initial intervention patient continues to complain of headache, uncontrollable pruritus.  Update: Patient has received Compazine, droperidol, continues to be awake, complaining of unrelenting pruritus, husband reports some possible audio hallucination with verbal response.  Update: I again discussed the patient's presentation with husband, her, considerations of withdrawal patient will receive Dilaudid.  12:10  AM Patient now resting for the first time, we discussed implications of this, consistent with concern for withdrawal, CT reviewed, reassuring, labs unrevealing, vitals reassuring.  Husband and patient will follow up with her pain management physician tomorrow for continued therapy, consideration of withdrawal and alteration in plan for tapering of narcotic dependency. MDM Rules/Calculators/A&P MDM Number of Diagnoses or Management Options Delirium: new, needed workup Narcotic withdrawal ([place]): new, needed workup   Amount and/or Complexity of Data Reviewed Clinical lab tests: reviewed and ordered Tests in the radiology section of CPT: ordered and reviewed Tests in the medicine section of CPT: ordered and reviewed Decide to obtain previous medical records or to obtain history from someone other than the patient: yes Obtain history from someone other than the patient: yes Review and summarize past medical records: yes Independent visualization of images, tracings, or specimens: yes  Risk of Complications, Morbidity, and/or Mortality Presenting problems: high Diagnostic procedures: high Management options: high  Critical Care Total time providing critical care: 30-74 minutes (35)  Patient Progress Patient progress: improved   Final Clinical Impression(s) / ED Diagnoses Final diagnoses:  Delirium  Narcotic withdrawal ([place])     Carmin Muskrat, MD 04/05/21 0013

## 2021-04-05 NOTE — Discharge Instructions (Addendum)
As discussed, your evaluation in the ED tonight is consistent with delirium likely due to narcotic withdrawal.  Your medications that you presented today include: Benadryl Clonidine Ativan Droperidol Dilaudid  Please discuss these medications with your physician tomorrow.  It is important to consider an appropriate means of tapering your pain medication regimen with him.  Return here for concerning changes in your condition.

## 2021-04-06 DIAGNOSIS — I10 Essential (primary) hypertension: Secondary | ICD-10-CM | POA: Diagnosis not present

## 2021-04-06 DIAGNOSIS — G4700 Insomnia, unspecified: Secondary | ICD-10-CM | POA: Diagnosis not present

## 2021-04-09 ENCOUNTER — Telehealth: Payer: Self-pay | Admitting: Pain Medicine

## 2021-04-09 DIAGNOSIS — G4700 Insomnia, unspecified: Secondary | ICD-10-CM | POA: Diagnosis not present

## 2021-04-09 DIAGNOSIS — I10 Essential (primary) hypertension: Secondary | ICD-10-CM | POA: Diagnosis not present

## 2021-04-09 NOTE — Telephone Encounter (Signed)
Called back to the patient to let her know that DR Holley Raring is in agreement with recommendations that were given and that this is withdrawals and she will have get through it and cope.  I have encouraged Clarise Cruz to use breathing techniques, guided imagery etc to help sooth her and allow her to rest.  Patient states she wish that someone would have told her what this would be like.

## 2021-04-10 ENCOUNTER — Telehealth: Payer: Self-pay

## 2021-04-10 NOTE — Telephone Encounter (Signed)
Pt refill on morphine it's not at the pharmacist

## 2021-04-10 NOTE — Telephone Encounter (Signed)
I have told patient that there are no future Rx's at the pharmacy.  She must complete Drug Holiday and then she has an appt on 04/30/21 at which time Dr Dossie Arbour will discuss future medications.

## 2021-04-11 ENCOUNTER — Other Ambulatory Visit: Payer: Self-pay | Admitting: Pain Medicine

## 2021-04-11 ENCOUNTER — Telehealth: Payer: Self-pay | Admitting: Pain Medicine

## 2021-04-11 DIAGNOSIS — M5414 Radiculopathy, thoracic region: Secondary | ICD-10-CM

## 2021-04-11 DIAGNOSIS — Z79891 Long term (current) use of opiate analgesic: Secondary | ICD-10-CM

## 2021-04-11 DIAGNOSIS — G894 Chronic pain syndrome: Secondary | ICD-10-CM

## 2021-04-11 DIAGNOSIS — M5442 Lumbago with sciatica, left side: Secondary | ICD-10-CM

## 2021-04-11 DIAGNOSIS — F19982 Other psychoactive substance use, unspecified with psychoactive substance-induced sleep disorder: Secondary | ICD-10-CM

## 2021-04-11 DIAGNOSIS — G8929 Other chronic pain: Secondary | ICD-10-CM

## 2021-04-11 DIAGNOSIS — Z79899 Other long term (current) drug therapy: Secondary | ICD-10-CM

## 2021-04-11 MED ORDER — MORPHINE SULFATE 15 MG PO TABS: 15.0000 mg | ORAL_TABLET | Freq: Three times a day (TID) | ORAL | 0 refills | Status: DC | PRN

## 2021-04-11 NOTE — Telephone Encounter (Signed)
Patient notified that Dr Dossie Arbour has sent in the prescription for 04-28-2021

## 2021-04-11 NOTE — Progress Notes (Signed)
The patient should be done with her drug holiday on 04/28/2021.  At that time we will restart her again on the morphine, but because her tolerance to it should be less we will be starting her at 15 mg p.o. 3 times daily instead of 60 mg twice daily.  The prescription was sent to the pharmacy today.

## 2021-04-11 NOTE — Telephone Encounter (Signed)
Spoke with patient and notified her that Dr Dossie Arbour has nothing else to offer at this time but would be sending in her medications for when they are due in September.  I will notify the patient once they have been sent.

## 2021-04-11 NOTE — Telephone Encounter (Signed)
Spoke with patient.  She is crying and states she cant do this.  States she is miserable and asked me to beg Dr Dossie Arbour to give her medications.  States she cant do this.  I will speak to Dr Dossie Arbour and call the patient back.

## 2021-04-13 LAB — CYTOCHROME P450 2D6/2C19: 2D6 Metabolic Activity:: NORMAL

## 2021-04-26 DIAGNOSIS — J479 Bronchiectasis, uncomplicated: Secondary | ICD-10-CM | POA: Diagnosis not present

## 2021-04-26 DIAGNOSIS — G8929 Other chronic pain: Secondary | ICD-10-CM | POA: Diagnosis not present

## 2021-04-26 DIAGNOSIS — I10 Essential (primary) hypertension: Secondary | ICD-10-CM | POA: Diagnosis not present

## 2021-04-26 DIAGNOSIS — M810 Age-related osteoporosis without current pathological fracture: Secondary | ICD-10-CM | POA: Diagnosis not present

## 2021-04-26 DIAGNOSIS — G4700 Insomnia, unspecified: Secondary | ICD-10-CM | POA: Diagnosis not present

## 2021-04-26 DIAGNOSIS — M150 Primary generalized (osteo)arthritis: Secondary | ICD-10-CM | POA: Diagnosis not present

## 2021-04-26 DIAGNOSIS — K219 Gastro-esophageal reflux disease without esophagitis: Secondary | ICD-10-CM | POA: Diagnosis not present

## 2021-04-26 DIAGNOSIS — D508 Other iron deficiency anemias: Secondary | ICD-10-CM | POA: Diagnosis not present

## 2021-04-28 NOTE — Progress Notes (Signed)
PROVIDER NOTE: Information contained herein reflects review and annotations entered in association with encounter. Interpretation of such information and data should be left to medically-trained personnel. Information provided to patient can be located elsewhere in the medical record under "Patient Instructions". Document created using STT-dictation technology, any transcriptional errors that may result from process are unintentional.    Patient: Kari Gibbs  Service Category: E/M  Provider: Gaspar Cola, MD  DOB: [birth year]  DOS: 04/30/2021  Specialty: Interventional Pain Management  MRN: [id number]  Setting: Ambulatory outpatient  PCP: Cari Caraway, MD  Type: Established Patient    Referring Provider: Cari Caraway, MD  Location: Office  Delivery: Face-to-face     HPI  [name], a 70 y.o. year old female, is here today because of her Chronic pain syndrome [G89.4]. Ms. Pillard primary complain today is Abdominal Pain (Under left breast) Last encounter: My last encounter with her was on 04/11/2021. Pertinent problems: Ms. Char has Fracture of rib (4th and 8th rib) (Left); Chronic pain syndrome; Chronic flank pain (1ry area of Pain) (Left); Chronic thoracic radicular pain (Left-sided); Chronic occipital neuralgia (Left); Neuropathic pain; Neurogenic pain; History of rib fracture (4th and 8th rib) (Left); Vertebral body hemangioma (T11); Metatarsalgia of foot (Right); Chronic abdominal pain (2ry area of Pain) (Bilateral) (L>R); Primary osteoarthritis; Chronic neck pain (4th area of Pain) (Bilateral) (L>R); Cervical facet syndrome (Bilateral) (L>R); Muscle spasticity; Tremors of nervous system; Muscle twitching; Cervical spondylosis; Chronic low back pain (3ry area of Pain) (Bilateral) w/o sciatica; Acute costochondritis (Left); Chronic low back pain (Bilateral) w/ sciatica (Bilateral); Chronic sacroiliac joint pain (Bilateral); Other specified dorsopathies, sacral and  sacrococcygeal region; Chronic sacroiliac joint somatic dysfunction (Bilateral); Chronic postoperative pain; Epigastric pain; Left upper quadrant pain; Generalized pain; and Headache on their pertinent problem list. Pain Assessment: Severity of Chronic pain is reported as a 1 /10. Location: Abdomen Left/radiates from back to under left breast. Onset: More than a month ago. Quality: Squeezing. Timing: Constant. Modifying factor(s): medications. Vitals:  height is _0  (1.676 m) and weight is 218 lb (98.9 kg). Her temperature is 96.8 F (36 C) (abnormal). Her blood pressure is 91/56 (abnormal) and her pulse is 96. Her oxygen saturation is 95%.   Reason for encounter: medication management.   The patient indicates doing well with the current medication regimen. No adverse reactions or side effects reported to the medications.   The patient recently completed an opioid analgesic "Drug Holiday" on 04/13/2021.  She is scheduled to go back on the opioids on 04/28/2021.  A prescription was already sent to her pharmacy on 04/11/2021.  The patient describes having had quite a bit of difficulty with the drug holiday secondary to her usual left flank pain and inability to sleep at night.  During the drug holiday she was provided with prednisone 20 mg tablets to address her pain as well as gabapentin 800 mg to be taken at bedtime along with melatonin 20 mg to help her with her sleep.  At 1 point I also provided her with a clonidine 0.1 mg pill to be taken p.o. at bedtime, in case she was experiencing any type of withdrawals, which is not really what she was describing.  According to the review of the PMP, on 04/28/2021 she had her prescription filled for only 27 out of the 90 pills prescribed.  Apparently she took a "partial failed" from the pharmacy despite the fact that our "[place] REMINDERS" patient information clearly states not to do so.  Today I will again be providing her with another copy of  this document.  This is not the first time that she is given a copy.  Clearly she has not been reading the information that we have been providing her.  Today we ended up discussing several issues including the partial fills as well and the fact that she had several of her medications switched around during her drug holiday.  Today we will discontinue the gabapentin since I do not think that we need to prescribe it anymore and the patient seems to think that it is really not contributing that much to her pain relief.  The patient has been having several problems with unsteadiness on her ambulation which apparently has to do with her blood pressure being very low.  Apparently she had her clonidine increased to 3 times daily.  Today I have recommended to her husband to talk to her primary care physician to begin tapering her off of as many medications as possible.  Today the patient has admitted that the drug holiday has improved how the morphine works and she seems to be having much better relief of the pain at this point.  RTCB: 08/05/2021 Nonopioids transferred 05/29/2020: Gabapentin and Voltaren gel  Pharmacotherapy Assessment  Analgesic: MS Contin 30 mg, 1 tab PO q 12 hrs (60 mg/day of morphine) MME/day: 60 mg/day.   Monitoring: [place] PMP: PDMP reviewed during this encounter.       Pharmacotherapy: No side-effects or adverse reactions reported. Compliance: No problems identified. Effectiveness: Clinically acceptable.  Dewayne Shorter, RN  04/30/2021  1:47 PM  Sign when Signing Visit Nursing Pain Medication Assessment:  Safety precautions to be maintained throughout the outpatient stay will include: orient to surroundings, keep bed in low position, maintain call bell within reach at all times, provide assistance with transfer out of bed and ambulation.  Medication Inspection Compliance: Pill count conducted under aseptic conditions, in front of the patient. Neither the pills nor the bottle was  removed from the patient's sight at any time. Once count was completed pills were immediately returned to the patient in their original bottle.  Medication: Morphine IR Pill/Patch Count:  21 of 27 pills remain Pill/Patch Appearance: Markings consistent with prescribed medication Bottle Appearance: Standard pharmacy container. Clearly labeled. Filled Date: 09 / 17 / 2022 Last Medication intake:  Today    UDS:  Summary  Date Value Ref Range Status  03/07/2021 Note  Final    Comment:    ==================================================================== [employer] [facility] (MW) ==================================================================== Test                             Result       Flag       Units  Drug Present and Declared for Prescription Verification   Lorazepam                      1518         EXPECTED   ng/mg creat    Source of lorazepam is a scheduled prescription medication.    Morphine                       >9259        EXPECTED   ng/mg creat   Normorphine                    533  EXPECTED   ng/mg creat    Potential sources of large amounts of morphine in the absence of    codeine include administration of morphine or use of heroin.     Normorphine is an expected metabolite of morphine.    Hydromorphone                  126          EXPECTED   ng/mg creat    Hydromorphone may be present as a metabolite of morphine;    concentrations of hydromorphone rarely exceed 5% of the morphine    concentration when this is the source of hydromorphone.  Drug Absent but Declared for Prescription Verification   Oxycodone                      Not Detected UNEXPECTED ng/mg creat ==================================================================== Test                      Result    Flag   Units      Ref Range   Creatinine              108              mg/dL      >=20 ==================================================================== Declared Medications:  The  flagging and interpretation on this report are based on the  following declared medications.  Unexpected results may arise from  inaccuracies in the declared medications.   **Note: The testing scope of this panel includes these medications:   Lorazepam (Ativan)  Morphine (MSIR)  Oxycodone (Roxicodone)   **Note: The testing scope of this panel does not include the  following reported medications:   Acetaminophen (Tylenol)  Azelastine (Astelin)  Benzonatate (Tessalon)  Biotin  Buspirone (Buspar)  Cranberry  Dextromethorphan  Diclofenac (Voltaren)  Esomeprazole (Nexium)  Estrogens (Premarin)  Folic Acid  Gabapentin (Neurontin)  Ibuprofen (Advil)  Irbesartan (Avapro)  Iron  Loratadine (Claritin)  Magnesium  Montelukast (Singulair)  Multivitamin  Naloxone (Narcan)  Potassium  Promethazine  Supplement  Vitamin C  Vitamin D3 ==================================================================== For clinical consultation, please call [phone]. ====================================================================      ROS  Constitutional: Denies any fever or chills Gastrointestinal: No reported hemesis, hematochezia, vomiting, or acute GI distress Musculoskeletal: Denies any acute onset joint swelling, redness, loss of ROM, or weakness Neurological: No reported episodes of acute onset apraxia, aphasia, dysarthria, agnosia, amnesia, paralysis, loss of coordination, or loss of consciousness  Medication Review  Biotin, Cranberry, FLUoxetine, Flutter, LORazepam, Magnesium, Peppermint Oil, Prenatal MV & Min w/FA-DHA, RA Probiotic Gummies, Vitamin D3, acetaminophen, azelastine, benzonatate, busPIRone, cloNIDine, conjugated estrogens, diclofenac Sodium, doxepin, esomeprazole, irbesartan, loratadine, montelukast, morphine, naloxone, potassium gluconate, promethazine-dextromethorphan, and venlafaxine  History Review  Allergy: Ms. Allington is allergic to iohexol, ace  inhibitors, losartan potassium, and neosporin [neomycin-bacitracin zn-polymyx]. Drug: Ms. Oborn  reports no history of drug use. Alcohol:  reports that she does not currently use alcohol. Tobacco:  reports that she has never smoked. She has never used smokeless tobacco. Social: Ms. Haile  reports that she has never smoked. She has never used smokeless tobacco. She reports that she does not currently use alcohol. She reports that she does not use drugs. 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 Surgical: Ms. 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 Family: family history includes Alzheimer's disease in her father; Hypertension in her mother.  Laboratory Chemistry Profile   Renal Lab Results  Component Value Date   BUN 20 04/04/2021   CREATININE 0.97 04/04/2021   GFRAA >60 01/04/2016   GFRNONAA >60 04/04/2021    Hepatic Lab Results  Component Value Date   AST 29 04/04/2021   ALT 30 04/04/2021   ALBUMIN 4.6 04/04/2021    ALKPHOS 89 04/04/2021   AMYLASE 30 02/27/2010   LIPASE 22 10/12/2012    Electrolytes Lab Results  Component Value Date   NA 140 04/04/2021   K 4.2 04/04/2021   CL 103 04/04/2021   CALCIUM 10.0 04/04/2021   MG 2.1 01/04/2016    Bone Lab Results  Component Value Date   25OHVITD1 49 01/04/2016   25OHVITD2 25 01/04/2016   25OHVITD3 24 01/04/2016    Inflammation (CRP: Acute Phase) (ESR: Chronic Phase) Lab Results  Component Value Date   CRP <0.5 01/04/2016   ESRSEDRATE 17 01/04/2016         Note: Above Lab results reviewed.  Recent Imaging Review  CT HEAD WO CONTRAST (5MM) CLINICAL DATA:  Headaches and hypertension  EXAM: CT HEAD WITHOUT CONTRAST  TECHNIQUE: Contiguous axial images were obtained from the base of the skull through the vertex without intravenous contrast.  COMPARISON:  07/01/2017  FINDINGS: Brain: No evidence of acute infarction, hemorrhage, hydrocephalus, extra-axial collection or mass lesion/mass effect.  Vascular: No hyperdense vessel or unexpected calcification.  Skull: Normal. Negative for fracture or focal lesion.  Sinuses/Orbits: No acute finding.  Other: None.  IMPRESSION: No acute intracranial abnormality.  Electronically Signed   By: Inez Catalina M.D.   On: 04/04/2021 19:32 Note: Reviewed        Physical Exam  General appearance: Well nourished, well developed, and well hydrated. In no apparent acute distress Mental status: Alert, oriented x 3 (person, place, & time)       Respiratory: No evidence of acute respiratory distress Eyes: PERLA Vitals: BP (!) 91/56 Comment: Dr Dossie Arbour notified  Pulse 96   Temp (!) 96.8 F (36 C)   Ht _0  (1.676 m)   Wt 218 lb (98.9 kg)   SpO2 95%   BMI 35.19 kg/m  BMI: Estimated body mass index is 35.19 kg/m as calculated from the following:   Height as of this encounter: _1  (1.676 m).   Weight as of this encounter: 218 lb (98.9 kg). Ideal: Ideal body weight: 59.3 kg (130 lb 11.7  oz) Adjusted ideal body weight: 75.1 kg (165 lb 10.2 oz)  Assessment   Status Diagnosis  Controlled Controlled Controlled 1. Chronic pain syndrome   2. Chronic flank pain (1ry area of Pain) (Left)   3. Chronic abdominal pain (2ry area of Pain) (Bilateral) (L>R)   4. Chronic low back pain (3ry area of Pain) (Bilateral) w/o sciatica   5. Chronic neck pain (4th area of Pain) (Bilateral) (L>R)  6. Chronic postoperative pain   7. Pharmacologic therapy   8. Chronic use of opiate for therapeutic purpose   9. Physical tolerance to opiate drug   10. Encounter for medication management   11. Opioid-induced constipation ([facility])   12. Uncomplicated opioid dependence ([place])   13. Chronic flank pain (Primary Area of Pain) (Left)   14. Chronic low back pain (Bilateral) w/ sciatica (Bilateral)   15. Chronic thoracic radicular pain (Left-sided)   16. Neurogenic pain      Updated Problems: Problem  Chronic low back pain (3ry area of Pain) (Bilateral) w/o sciatica  Chronic neck pain (4th area of Pain) (Bilateral) (L>R)  Chronic flank pain (1ry area of Pain) (Left)  Chronic abdominal pain (2ry area of Pain) (Bilateral) (L>R)   Possible left-sided thoracic radiculopathy     Plan of Care  Problem-specific:  No problem-specific Assessment & Plan notes found for this encounter.  Ms. Lynsee Wands has a current medication list which includes the following long-term medication(s): diclofenac sodium, doxepin, fluoxetine, loratadine, [START ON 05/07/2021] morphine, [START ON 06/06/2021] morphine, and [START ON 07/06/2021] morphine.  Pharmacotherapy (Medications Ordered): Meds ordered this encounter  Medications   morphine (MSIR) 15 MG tablet    Sig: Take 1 tablet (15 mg total) by mouth every 8 (eight) hours as needed. Must last 30 days.    Dispense:  90 tablet    Refill:  0    Not a duplicate. Do NOT delete! Dispense 1 day early if closed on fill date. Warn: no CNS-depressants within 8 hrs of  opioid. Do not send refill request. Renewal requires appointment. Partial fills not allowed.   morphine (MSIR) 15 MG tablet    Sig: Take 1 tablet (15 mg total) by mouth every 8 (eight) hours as needed. Must last 30 days.    Dispense:  90 tablet    Refill:  0    Not a duplicate. Do NOT delete! Dispense 1 day early if closed on fill date. Warn: no CNS-depressants within 8 hrs of opioid. Do not send refill request. Renewal requires appointment. Partial fills not allowed.   morphine (MSIR) 15 MG tablet    Sig: Take 1 tablet (15 mg total) by mouth every 8 (eight) hours as needed. Must last 30 days.    Dispense:  90 tablet    Refill:  0    Not a duplicate. Do NOT delete! Dispense 1 day early if closed on fill date. Warn: no CNS-depressants within 8 hrs of opioid. Do not send refill request. Renewal requires appointment. Partial fills not allowed.    Orders:  No orders of the defined types were placed in this encounter.  Follow-up plan:   Return in about 3 months (around 08/05/2021) for Eval-day (M,W), (F2F), (MM).     Interventional management options:  Considering:   Diagnostic left costochondral joint injection  Possible left intercostal nerve RFA  Therapeutic Left greater occipital nerve RFA  Diagnostic left C2 + TON nerve block  Possible left C2 + TON RFA    Palliative PRN treatment(s):   Palliative left intercostal nerve blocks (T5 to T8) #2  Palliative left GONB block #3  Palliative bilateral sacroiliac joint block #2       Recent Visits Date Type Provider Dept  04/04/21 Office Visit [name], MD [facility]  03/28/21 Office Visit [name], MD [facility]  03/07/21 Office Visit [name], MD [facility]  Showing recent visits within past 90 days and meeting  all other requirements Today's Visits Date Type Provider Dept  04/30/21 Office Visit [name], MD [facility]  Showing today's visits and  meeting all other requirements Future Appointments No visits were found meeting these conditions. Showing future appointments within next 90 days and meeting all other requirements I discussed the assessment and treatment plan with the patient. The patient was provided an opportunity to ask questions and all were answered. The patient agreed with the plan and demonstrated an understanding of the instructions.  Patient advised to call back or seek an in-person evaluation if the symptoms or condition worsens.  Duration of encounter: 42 minutes.  Note by: Gaspar Cola, MD Date: 04/30/2021; Time: 2:31 PM

## 2021-04-30 ENCOUNTER — Ambulatory Visit: Payer: Medicare Other | Attending: Pain Medicine | Admitting: Pain Medicine

## 2021-04-30 ENCOUNTER — Other Ambulatory Visit: Payer: Self-pay

## 2021-04-30 ENCOUNTER — Encounter: Payer: Self-pay | Admitting: Pain Medicine

## 2021-04-30 VITALS — BP 91/56 | HR 96 | Temp 96.8°F | Ht 66.0 in | Wt 218.0 lb

## 2021-04-30 DIAGNOSIS — R10A2 Flank pain, left side: Secondary | ICD-10-CM

## 2021-04-30 DIAGNOSIS — Z79891 Long term (current) use of opiate analgesic: Secondary | ICD-10-CM

## 2021-04-30 DIAGNOSIS — M545 Low back pain, unspecified: Secondary | ICD-10-CM | POA: Diagnosis not present

## 2021-04-30 DIAGNOSIS — G8928 Other chronic postprocedural pain: Secondary | ICD-10-CM

## 2021-04-30 DIAGNOSIS — G8929 Other chronic pain: Secondary | ICD-10-CM

## 2021-04-30 DIAGNOSIS — F1190 Opioid use, unspecified, uncomplicated: Secondary | ICD-10-CM

## 2021-04-30 DIAGNOSIS — M5441 Lumbago with sciatica, right side: Secondary | ICD-10-CM | POA: Diagnosis not present

## 2021-04-30 DIAGNOSIS — K5903 Drug induced constipation: Secondary | ICD-10-CM | POA: Insufficient documentation

## 2021-04-30 DIAGNOSIS — R1011 Right upper quadrant pain: Secondary | ICD-10-CM | POA: Diagnosis not present

## 2021-04-30 DIAGNOSIS — M5414 Radiculopathy, thoracic region: Secondary | ICD-10-CM | POA: Diagnosis not present

## 2021-04-30 DIAGNOSIS — T402X5A Adverse effect of other opioids, initial encounter: Secondary | ICD-10-CM | POA: Diagnosis not present

## 2021-04-30 DIAGNOSIS — F1120 Opioid dependence, uncomplicated: Secondary | ICD-10-CM | POA: Diagnosis present

## 2021-04-30 DIAGNOSIS — R109 Unspecified abdominal pain: Secondary | ICD-10-CM | POA: Insufficient documentation

## 2021-04-30 DIAGNOSIS — Z79899 Other long term (current) drug therapy: Secondary | ICD-10-CM | POA: Diagnosis not present

## 2021-04-30 DIAGNOSIS — M792 Neuralgia and neuritis, unspecified: Secondary | ICD-10-CM

## 2021-04-30 DIAGNOSIS — M542 Cervicalgia: Secondary | ICD-10-CM | POA: Diagnosis not present

## 2021-04-30 DIAGNOSIS — M5442 Lumbago with sciatica, left side: Secondary | ICD-10-CM | POA: Insufficient documentation

## 2021-04-30 DIAGNOSIS — R1012 Left upper quadrant pain: Secondary | ICD-10-CM | POA: Diagnosis not present

## 2021-04-30 DIAGNOSIS — G894 Chronic pain syndrome: Secondary | ICD-10-CM

## 2021-04-30 MED ORDER — MORPHINE SULFATE 15 MG PO TABS: 15.0000 mg | ORAL_TABLET | Freq: Three times a day (TID) | ORAL | 0 refills | Status: DC | PRN

## 2021-04-30 NOTE — Progress Notes (Signed)
Nursing Pain Medication Assessment:  Safety precautions to be maintained throughout the outpatient stay will include: orient to surroundings, keep bed in low position, maintain call bell within reach at all times, provide assistance with transfer out of bed and ambulation.  Medication Inspection Compliance: Pill count conducted under aseptic conditions, in front of the patient. Neither the pills nor the bottle was removed from the patient's sight at any time. Once count was completed pills were immediately returned to the patient in their original bottle.  Medication: Morphine IR Pill/Patch Count:  21 of 27 pills remain Pill/Patch Appearance: Markings consistent with prescribed medication Bottle Appearance: Standard pharmacy container. Clearly labeled. Filled Date: 09 / 17 / 2022 Last Medication intake:  Today

## 2021-04-30 NOTE — Patient Instructions (Signed)
____________________________________________________________________________________________  Medication Rules  Purpose: To inform patients, and their family members, of our rules and regulations.  Applies to: All patients receiving prescriptions (written or electronic).  Pharmacy of record: Pharmacy where electronic prescriptions will be sent. If written prescriptions are taken to a different pharmacy, please inform the nursing staff. The pharmacy listed in the electronic medical record should be the one where you would like electronic prescriptions to be sent.  Electronic prescriptions: In compliance with the [place] Strengthen Opioid Misuse Prevention (STOP) Act of 2017 (Session Law 2017-74/H243), effective August 12, 2018, all controlled substances must be electronically prescribed. Calling prescriptions to the pharmacy will cease to exist.  Prescription refills: Only during scheduled appointments. Applies to all prescriptions.  NOTE: The following applies primarily to controlled substances (Opioid* Pain Medications).   Type of encounter (visit): For patients receiving controlled substances, face-to-face visits are required. (Not an option or up to the patient.)  Patient's responsibilities: Pain Pills: Bring all pain pills to every appointment (except for procedure appointments). Pill Bottles: Bring pills in original pharmacy bottle. Always bring the newest bottle. Bring bottle, even if empty. Medication refills: You are responsible for knowing and keeping track of what medications you take and those you need refilled. The day before your appointment: write a list of all prescriptions that need to be refilled. The day of the appointment: give the list to the admitting nurse. Prescriptions will be written only during appointments. No prescriptions will be written on procedure days. If you forget a medication: it will not be "Called in", "Faxed", or "electronically sent". You will  need to get another appointment to get these prescribed. No early refills. Do not call asking to have your prescription filled early. Prescription Accuracy: You are responsible for carefully inspecting your prescriptions before leaving our office. Have the discharge nurse carefully go over each prescription with you, before taking them home. Make sure that your name is accurately spelled, that your address is correct. Check the name and dose of your medication to make sure it is accurate. Check the number of pills, and the written instructions to make sure they are clear and accurate. Make sure that you are given enough medication to last until your next medication refill appointment. Taking Medication: Take medication as prescribed. When it comes to controlled substances, taking less pills or less frequently than prescribed is permitted and encouraged. Never take more pills than instructed. Never take medication more frequently than prescribed.  Inform other Doctors: Always inform, all of your healthcare providers, of all the medications you take. Pain Medication from other Providers: You are not allowed to accept any additional pain medication from any other Doctor or Healthcare provider. There are two exceptions to this rule. (see below) In the event that you require additional pain medication, you are responsible for notifying us, as stated below. Cough Medicine: Often these contain an opioid, such as codeine or hydrocodone. Never accept or take cough medicine containing these opioids if you are already taking an opioid* medication. The combination may cause respiratory failure and death. Medication Agreement: You are responsible for carefully reading and following our Medication Agreement. This must be signed before receiving any prescriptions from our practice. Safely store a copy of your signed Agreement. Violations to the Agreement will result in no further prescriptions. (Additional copies of our  Medication Agreement are available upon request.) Laws, Rules, & Regulations: All patients are expected to follow all Federal and State Laws, Statutes, Rules, & Regulations. Ignorance of   the Laws does not constitute a valid excuse.  Illegal drugs and Controlled Substances: The use of illegal substances (including, but not limited to marijuana and its derivatives) and/or the illegal use of any controlled substances is strictly prohibited. Violation of this rule may result in the immediate and permanent discontinuation of any and all prescriptions being written by our practice. The use of any illegal substances is prohibited. Adopted CDC guidelines & recommendations: Target dosing levels will be at or below 60 MME/day. Use of benzodiazepines** is not recommended.  Exceptions: There are only two exceptions to the rule of not receiving pain medications from other Healthcare Providers. Exception #1 (Emergencies): In the event of an emergency (i.e.: accident requiring emergency care), you are allowed to receive additional pain medication. However, you are responsible for: As soon as you are able, call our office [phone], at any time of the day or night, and leave a message stating your name, the date and nature of the emergency, and the name and dose of the medication prescribed. In the event that your call is answered by a member of our staff, make sure to document and save the date, time, and the name of the person that took your information.  Exception #2 (Planned Surgery): In the event that you are scheduled by another doctor or dentist to have any type of surgery or procedure, you are allowed (for a period no longer than 30 days), to receive additional pain medication, for the acute post-op pain. However, in this case, you are responsible for picking up a copy of our "Post-op Pain Management for Surgeons" handout, and giving it to your surgeon or dentist. This document is available at our office, and  does not require an appointment to obtain it. Simply go to our office during business hours (Monday-Thursday from 8:00 AM to 4:00 PM) (Friday 8:00 AM to 12:00 Noon) or if you have a scheduled appointment with us, prior to your surgery, and ask for it by name. In addition, you are responsible for: calling our office [phone], at any time of the day or night, and leaving a message stating your name, name of your surgeon, type of surgery, and date of procedure or surgery. Failure to comply with your responsibilities may result in termination of therapy involving the controlled substances.  *Opioid medications include: morphine, codeine, oxycodone, oxymorphone, hydrocodone, hydromorphone, meperidine, tramadol, tapentadol, buprenorphine, fentanyl, methadone. **Benzodiazepine medications include: diazepam (Valium), alprazolam (Xanax), clonazepam (Klonopine), lorazepam (Ativan), clorazepate (Tranxene), chlordiazepoxide (Librium), estazolam (Prosom), oxazepam (Serax), temazepam (Restoril), triazolam (Halcion) (Last updated: 07/10/2020) ____________________________________________________________________________________________  ____________________________________________________________________________________________  Medication Recommendations and Reminders  Applies to: All patients receiving prescriptions (written and/or electronic).  Medication Rules & Regulations: These rules and regulations exist for your safety and that of others. They are not flexible and neither are we. Dismissing or ignoring them will be considered "non-compliance" with medication therapy, resulting in complete and irreversible termination of such therapy. (See document titled "Medication Rules" for more details.) In all conscience, because of safety reasons, we cannot continue providing a therapy where the patient does not follow instructions.  Pharmacy of record:  Definition: This is the pharmacy where your electronic  prescriptions will be sent.  We do not endorse any particular pharmacy, however, we have experienced problems with [facility] not securing enough medication supply for the community. We do not restrict you in your choice of pharmacy. However, once we write for your prescriptions, we will NOT be re-sending more prescriptions to fix restricted supply problems   created by your pharmacy, or your insurance.  The pharmacy listed in the electronic medical record should be the one where you want electronic prescriptions to be sent. If you choose to change pharmacy, simply notify our nursing staff.  Recommendations: Keep all of your pain medications in a safe place, under lock and key, even if you live alone. We will NOT replace lost, stolen, or damaged medication. After you fill your prescription, take 1 week's worth of pills and put them away in a safe place. You should keep a separate, properly labeled bottle for this purpose. The remainder should be kept in the original bottle. Use this as your primary supply, until it runs out. Once it's gone, then you know that you have 1 week's worth of medicine, and it is time to come in for a prescription refill. If you do this correctly, it is unlikely that you will ever run out of medicine. To make sure that the above recommendation works, it is very important that you make sure your medication refill appointments are scheduled at least 1 week before you run out of medicine. To do this in an effective manner, make sure that you do not leave the office without scheduling your next medication management appointment. Always ask the nursing staff to show you in your prescription , when your medication will be running out. Then arrange for the receptionist to get you a return appointment, at least 7 days before you run out of medicine. Do not wait until you have 1 or 2 pills left, to come in. This is very poor planning and does not take into consideration that we may need to  cancel appointments due to bad weather, sickness, or emergencies affecting our staff. DO NOT ACCEPT A "Partial Fill": If for any reason your pharmacy does not have enough pills/tablets to completely fill or refill your prescription, do not allow for a "partial fill". The law allows the pharmacy to complete that prescription within 72 hours, without requiring a new prescription. If they do not fill the rest of your prescription within those 72 hours, you will need a separate prescription to fill the remaining amount, which we will NOT provide. If the reason for the partial fill is your insurance, you will need to talk to the pharmacist about payment alternatives for the remaining tablets, but again, DO NOT ACCEPT A PARTIAL FILL, unless you can trust your pharmacist to obtain the remainder of the pills within 72 hours.  Prescription refills and/or changes in medication(s):  Prescription refills, and/or changes in dose or medication, will be conducted only during scheduled medication management appointments. (Applies to both, written and electronic prescriptions.) No refills on procedure days. No medication will be changed or started on procedure days. No changes, adjustments, and/or refills will be conducted on a procedure day. Doing so will interfere with the diagnostic portion of the procedure. No phone refills. No medications will be "called into the pharmacy". No Fax refills. No weekend refills. No Holliday refills. No after hours refills.  Remember:  Business hours are:  Monday to Thursday 8:00 AM to 4:00 PM Provider's Schedule: Mora Pedraza, MD - Appointments are:  Medication management: Monday and Wednesday 8:00 AM to 4:00 PM Procedure day: Tuesday and Thursday 7:30 AM to 4:00 PM Bilal Lateef, MD - Appointments are:  Medication management: Tuesday and Thursday 8:00 AM to 4:00 PM Procedure day: Monday and Wednesday 7:30 AM to 4:00 PM (Last update:  03/01/2020) ____________________________________________________________________________________________   

## 2021-05-03 DIAGNOSIS — Z23 Encounter for immunization: Secondary | ICD-10-CM | POA: Diagnosis not present

## 2021-05-10 DIAGNOSIS — M1712 Unilateral primary osteoarthritis, left knee: Secondary | ICD-10-CM | POA: Diagnosis not present

## 2021-05-10 DIAGNOSIS — M1711 Unilateral primary osteoarthritis, right knee: Secondary | ICD-10-CM | POA: Diagnosis not present

## 2021-05-23 DIAGNOSIS — Z23 Encounter for immunization: Secondary | ICD-10-CM | POA: Diagnosis not present

## 2021-06-10 DIAGNOSIS — M810 Age-related osteoporosis without current pathological fracture: Secondary | ICD-10-CM | POA: Diagnosis not present

## 2021-06-10 DIAGNOSIS — K219 Gastro-esophageal reflux disease without esophagitis: Secondary | ICD-10-CM | POA: Diagnosis not present

## 2021-06-10 DIAGNOSIS — G8929 Other chronic pain: Secondary | ICD-10-CM | POA: Diagnosis not present

## 2021-06-10 DIAGNOSIS — I10 Essential (primary) hypertension: Secondary | ICD-10-CM | POA: Diagnosis not present

## 2021-06-10 DIAGNOSIS — G4700 Insomnia, unspecified: Secondary | ICD-10-CM | POA: Diagnosis not present

## 2021-06-10 DIAGNOSIS — J479 Bronchiectasis, uncomplicated: Secondary | ICD-10-CM | POA: Diagnosis not present

## 2021-06-10 DIAGNOSIS — M150 Primary generalized (osteo)arthritis: Secondary | ICD-10-CM | POA: Diagnosis not present

## 2021-06-10 DIAGNOSIS — D508 Other iron deficiency anemias: Secondary | ICD-10-CM | POA: Diagnosis not present

## 2021-06-21 DIAGNOSIS — M1712 Unilateral primary osteoarthritis, left knee: Secondary | ICD-10-CM | POA: Diagnosis not present

## 2021-06-21 DIAGNOSIS — S83241A Other tear of medial meniscus, current injury, right knee, initial encounter: Secondary | ICD-10-CM | POA: Diagnosis not present

## 2021-06-21 DIAGNOSIS — M25561 Pain in right knee: Secondary | ICD-10-CM | POA: Diagnosis not present

## 2021-06-21 DIAGNOSIS — M19071 Primary osteoarthritis, right ankle and foot: Secondary | ICD-10-CM | POA: Diagnosis not present

## 2021-07-04 ENCOUNTER — Other Ambulatory Visit: Payer: Self-pay | Admitting: Pain Medicine

## 2021-07-04 DIAGNOSIS — M792 Neuralgia and neuritis, unspecified: Secondary | ICD-10-CM

## 2021-07-12 DIAGNOSIS — M25561 Pain in right knee: Secondary | ICD-10-CM | POA: Diagnosis not present

## 2021-07-19 DIAGNOSIS — M25561 Pain in right knee: Secondary | ICD-10-CM | POA: Diagnosis not present

## 2021-07-27 NOTE — Progress Notes (Signed)
PROVIDER NOTE: Information contained herein reflects review and annotations entered in association with encounter. Interpretation of such information and data should be left to medically-trained personnel. Information provided to patient can be located elsewhere in the medical record under "Patient Instructions". Document created using STT-dictation technology, any transcriptional errors that may result from process are unintentional.    Patient: Kari Gibbs  Service Category: E/M  Provider: Gaspar Cola, MD  DOB: 06/14/1951  DOS: 07/30/2021  Specialty: Interventional Pain Management  MRN: [id number]  Setting: Ambulatory outpatient  PCP: Kari Caraway, MD  Type: Established Patient    Referring Provider: Cari Caraway, MD  Location: Office  Delivery: Face-to-face     HPI  [name], a 70 y.o. year old female, is here today because of her Chronic left flank pain [R10.9, G89.29]. [name] primary complain today is Abdominal Pain (Left, upper) Last encounter: My last encounter with her was on 07/04/2021. Pertinent problems: [name] has Fracture of rib (4th and 8th rib) (Left); Chronic pain syndrome; Chronic flank pain (1ry area of Pain) (Left); Chronic thoracic radicular pain (Left-sided); Chronic occipital neuralgia (Left); Neuropathic pain; Neurogenic pain; History of rib fracture (4th and 8th rib) (Left); Vertebral body hemangioma (T11); Metatarsalgia of foot (Right); Chronic abdominal pain (2ry area of Pain) (Bilateral) (L>R); Primary osteoarthritis; Chronic neck pain (4th area of Pain) (Bilateral) (L>R); Cervical facet syndrome (Bilateral) (L>R); Muscle spasticity; Tremors of nervous system; Muscle twitching; Cervical spondylosis; Chronic low back pain (3ry area of Pain) (Bilateral) w/o sciatica; Acute costochondritis (Left); Chronic low back pain (Bilateral) w/ sciatica (Bilateral); Chronic sacroiliac joint pain (Bilateral); Other specified dorsopathies, sacral and  sacrococcygeal region; Chronic sacroiliac joint somatic dysfunction (Bilateral); Chronic postoperative pain; Epigastric pain; Left upper quadrant pain; Generalized pain; and Headache on their pertinent problem list. Pain Assessment: Severity of Chronic pain is reported as a 0-No pain/10. Location: Abdomen Left, Upper/denies. Onset: More than a month ago. Quality: Other (Comment) (gripping, compression). Timing: Intermittent. Modifying factor(s): lying down, heat. Vitals:  height is $RemoveB'5\' 5"'FTqwyMPX$  (1.651 m) and weight is 210 lb (95.3 kg). Her temporal temperature is 97.2 F (36.2 C) (abnormal). Her blood pressure is 124/68 and her pulse is 80. Her respiration is 16 and oxygen saturation is 99%.   Reason for encounter: medication management.   The patient indicates doing well with the current medication regimen. No adverse reactions or side effects reported to the medications.  The patient indicates that it is pending to have some surgeries in both of her knees as well as one of her ankles.  Today we will provide her with a handout to give to the surgeon on how to manage the pain postoperatively.  In addition, the patient wanted to know if she needed to take her morphine regularly or could she not take it if she was not having any pain.  I reminded her that the morphine thing that she is taking is immediate release and it is meant for her to take it if she has pain.  I also confirmed for her that she should continue taking her gabapentin, which she indicates taking 400 mg at bedtime.  This combination seems to have her pain under control.  The only problem that she is having likely is that of an abdominal hernia, in the midline, right under her xiphoid.  I have recommended that she get a referral to surgery for them to do a perineorrhaphy and prevent that hernia from getting an incarcerated.  She has been describing several  episodes where it seems as she has come very close to getting the hernia incarcerated to the point  where it could become a surgical emergency.  Today we talked about the possibility of decreasing the total amount of pills that she needs on a monthly basis, but we will do this by averaging how much she needs over the next 3 months.  RTCB: 11/03/2021 Nonopioids transferred 05/29/2020: Gabapentin and Voltaren gel  Pharmacotherapy Assessment  Analgesic: MS IR 15 mg mg, 1 tab PO q 8 hrs (45 mg/day of morphine) MME/day: 45 mg/day.   Monitoring: [place] PMP: PDMP reviewed during this encounter.       Pharmacotherapy: No side-effects or adverse reactions reported. Compliance: No problems identified. Effectiveness: Clinically acceptable.  Kari Martins, RN  07/30/2021 12:45 PM  Sign when Signing Visit Nursing Pain Medication Assessment:  Safety precautions to be maintained throughout the outpatient stay will include: orient to surroundings, keep bed in low position, maintain call bell within reach at all times, provide assistance with transfer out of bed and ambulation.  Medication Inspection Compliance: Pill count conducted under aseptic conditions, in front of the patient. Neither the pills nor the bottle was removed from the patient's sight at any time. Once count was completed pills were immediately returned to the patient in their original bottle.  Medication: Morphine IR Pill/Patch Count:  9 0 Pill/Patch Appearance: Markings consistent with prescribed medication Bottle Appearance: Standard pharmacy container. Clearly labeled. Filled Date: 17 / 25 / 2022 Last Medication intake:  Today    UDS:  Summary  Date Value Ref Range Status  03/07/2021 Note  Final    Comment:    ==================================================================== [employer] [facility] 13 (MW) ==================================================================== Test                             Result       Flag       Units  Drug Present and Declared for Prescription Verification   Lorazepam                       1518         EXPECTED   ng/mg creat    Source of lorazepam is a scheduled prescription medication.    Morphine                       >9259        EXPECTED   ng/mg creat   Normorphine                    533          EXPECTED   ng/mg creat    Potential sources of large amounts of morphine in the absence of    codeine include administration of morphine or use of heroin.     Normorphine is an expected metabolite of morphine.    Hydromorphone                  126          EXPECTED   ng/mg creat    Hydromorphone may be present as a metabolite of morphine;    concentrations of hydromorphone rarely exceed 5% of the morphine    concentration when this is the source of hydromorphone.  Drug Absent but Declared for Prescription Verification   Oxycodone  Not Detected UNEXPECTED ng/mg creat ==================================================================== Test                      Result    Flag   Units      Ref Range   Creatinine              108              mg/dL      >=94 ==================================================================== Declared Medications:  The flagging and interpretation on this report are based on the  following declared medications.  Unexpected results may arise from  inaccuracies in the declared medications.   **Note: The testing scope of this panel includes these medications:   Lorazepam (Ativan)  Morphine (MSIR)  Oxycodone (Roxicodone)   **Note: The testing scope of this panel does not include the  following reported medications:   Acetaminophen (Tylenol)  Azelastine (Astelin)  Benzonatate (Tessalon)  Biotin  Buspirone (Buspar)  Cranberry  Dextromethorphan  Diclofenac (Voltaren)  Esomeprazole (Nexium)  Estrogens (Premarin)  Folic Acid  Gabapentin (Neurontin)  Ibuprofen (Advil)  Irbesartan (Avapro)  Iron  Loratadine (Claritin)  Magnesium  Montelukast (Singulair)  Multivitamin  Naloxone (Narcan)  Potassium  Promethazine   Supplement  Vitamin C  Vitamin D3 ==================================================================== For clinical consultation, please call [phone]. ====================================================================      ROS  Constitutional: Denies any fever or chills Gastrointestinal: No reported hemesis, hematochezia, vomiting, or acute GI distress Musculoskeletal: Denies any acute onset joint swelling, redness, loss of ROM, or weakness Neurological: No reported episodes of acute onset apraxia, aphasia, dysarthria, agnosia, amnesia, paralysis, loss of coordination, or loss of consciousness  Medication Review  Biotin, Cranberry, FLUoxetine, Flutter, LORazepam, Magnesium, Peppermint Oil, Prenatal MV & Min w/FA-DHA, RA Probiotic Gummies, Vitamin D3, acetaminophen, azelastine, benzonatate, buPROPion, busPIRone, conjugated estrogens, diclofenac Sodium, doxepin, esomeprazole, irbesartan, montelukast, morphine, naloxone, potassium gluconate, and promethazine-dextromethorphan  History Review  Allergy: [name] is allergic to iohexol, ace inhibitors, losartan potassium, and neosporin [neomycin-bacitracin zn-polymyx]. Drug: [name]  reports no history of drug use. Alcohol:  reports that she does not currently use alcohol. Tobacco:  reports that she has never smoked. She has never used smokeless tobacco. Social: [name]  reports that she has never smoked. She has never used smokeless tobacco. She reports that she does not currently use alcohol. She reports that she does not use drugs. 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 Surgical: 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 Family: family history includes Alzheimer's disease in her father; Hypertension in her mother.  Laboratory Chemistry Profile   Renal Lab Results  Component Value Date   BUN 20 04/04/2021   CREATININE 0.97 04/04/2021   GFRAA >60 01/04/2016   GFRNONAA >60 04/04/2021    Hepatic Lab Results  Component Value Date   AST 29 04/04/2021   ALT 30 04/04/2021   ALBUMIN 4.6 04/04/2021   ALKPHOS 89 04/04/2021   AMYLASE 30 02/27/2010   LIPASE 22 10/12/2012    Electrolytes Lab Results  Component Value Date   NA 140 04/04/2021   K 4.2 04/04/2021   CL 103 04/04/2021   CALCIUM 10.0 04/04/2021   MG 2.1 01/04/2016    Bone Lab Results  Component Value Date   25OHVITD1 49 01/04/2016   25OHVITD2 25 01/04/2016   25OHVITD3 24 01/04/2016    Inflammation (CRP: Acute Phase) (ESR: Chronic Phase) Lab Results  Component Value Date   CRP <0.5 01/04/2016   ESRSEDRATE 17 01/04/2016         Note: Above Lab results reviewed.  Recent Imaging Review  CT HEAD WO CONTRAST (5MM) CLINICAL DATA:  Headaches and hypertension  EXAM: CT HEAD WITHOUT CONTRAST  TECHNIQUE: Contiguous axial images were obtained from the base of the skull through the vertex without intravenous contrast.  COMPARISON:   07/01/2017  FINDINGS: Brain: No evidence of acute infarction, hemorrhage, hydrocephalus, extra-axial collection or mass lesion/mass effect.  Vascular: No hyperdense vessel or unexpected calcification.  Skull: Normal. Negative for fracture or focal lesion.  Sinuses/Orbits: No acute finding.  Other: None.  IMPRESSION: No acute intracranial abnormality.  Electronically Signed   By: Inez Catalina M.D.   On: 04/04/2021 19:32 Note: Reviewed        Physical Exam  General appearance: Well nourished, well developed, and well hydrated. In no apparent acute distress Mental status: Alert, oriented x 3 (person, place, & time)       Respiratory: No evidence of acute respiratory distress Eyes: PERLA Vitals: BP 124/68    Pulse 80    Temp (!) 97.2 F (36.2 C) (Temporal)    Resp 16    Ht $R'5\' 5"'Ab$  (1.651 m)    Wt 210 lb (95.3 kg)    SpO2 99%    BMI 34.95 kg/m  BMI: Estimated body mass index is 34.95 kg/m as calculated from the following:   Height as of this encounter: $RemoveBeforeD'5\' 5"'nRbyPSBDbqKxzo$  (1.651 m).   Weight as of this encounter: 210 lb (95.3 kg). Ideal: Ideal body weight: 57 kg (125 lb 10.6 oz) Adjusted ideal body weight: 72.3 kg (159 lb 6.4 oz)  Assessment   Status Diagnosis  Controlled Controlled Controlled 1. Chronic flank pain (1ry area of Pain) (Left)   2. Chronic abdominal pain (2ry area of Pain) (Bilateral) (L>R)   3. Chronic low back pain (3ry area of Pain) (Bilateral) w/o sciatica   4. Chronic neck pain (4th area of Pain) (Bilateral) (L>R)   5. Chronic pain syndrome   6. Pharmacologic therapy   7. Chronic use of opiate for therapeutic purpose   8. Encounter for medication management   9. Encounter for chronic pain management      Updated Problems: No problems updated.  Plan of Care  Problem-specific:  No problem-specific Assessment & Plan notes found for this encounter.  [name] has a current medication list which includes the following long-term medication(s): bupropion,  doxepin, fluoxetine, [START ON 08/05/2021] morphine, [START ON 09/04/2021] morphine, [START ON 10/04/2021] morphine, and diclofenac sodium.  Pharmacotherapy (Medications Ordered): Meds ordered this encounter  Medications   morphine (MSIR) 15 MG tablet    Sig: Take 1 tablet (15 mg total) by mouth every 8 (eight) hours as needed. Must last 30 days.    Dispense:  90 tablet  Refill:  0    DO NOT: delete (not duplicate); no partial-fill (will deny script to complete), no refill request (F/U required). DISPENSE: 1 day early if closed on fill date. WARN: No CNS-depressants within 8 hrs of med.   morphine (MSIR) 15 MG tablet    Sig: Take 1 tablet (15 mg total) by mouth every 8 (eight) hours as needed. Must last 30 days.    Dispense:  90 tablet    Refill:  0    DO NOT: delete (not duplicate); no partial-fill (will deny script to complete), no refill request (F/U required). DISPENSE: 1 day early if closed on fill date. WARN: No CNS-depressants within 8 hrs of med.   morphine (MSIR) 15 MG tablet    Sig: Take 1 tablet (15 mg total) by mouth every 8 (eight) hours as needed. Must last 30 days.    Dispense:  90 tablet    Refill:  0    DO NOT: delete (not duplicate); no partial-fill (will deny script to complete), no refill request (F/U required). DISPENSE: 1 day early if closed on fill date. WARN: No CNS-depressants within 8 hrs of med.   Orders:  No orders of the defined types were placed in this encounter.  Follow-up plan:   Return in about 3 months (around 11/03/2021) for Eval-day (M,W), (F2F), (MM).     Interventional management options:  Considering:   Diagnostic left costochondral joint injection  Possible left intercostal nerve RFA  Therapeutic Left greater occipital nerve RFA  Diagnostic left C2 + TON nerve block  Possible left C2 + TON RFA    Palliative PRN treatment(s):   Palliative left intercostal nerve blocks (T5 to T8) #2  Palliative left GONB block #3  Palliative bilateral  sacroiliac joint block #2     Recent Visits No visits were found meeting these conditions. Showing recent visits within past 90 days and meeting all other requirements Today's Visits Date Type Provider Dept  07/30/21 Office Visit [name], MD [facility]  Showing today's visits and meeting all other requirements Future Appointments No visits were found meeting these conditions. Showing future appointments within next 90 days and meeting all other requirements  I discussed the assessment and treatment plan with the patient. The patient was provided an opportunity to ask questions and all were answered. The patient agreed with the plan and demonstrated an understanding of the instructions.  Patient advised to call back or seek an in-person evaluation if the symptoms or condition worsens.  Duration of encounter: 30 minutes.  Note by: Kari Cola, MD Date: 07/30/2021; Time: 1:06 PM

## 2021-07-30 ENCOUNTER — Encounter: Payer: Self-pay | Admitting: Pain Medicine

## 2021-07-30 ENCOUNTER — Ambulatory Visit: Payer: Medicare Other | Attending: Pain Medicine | Admitting: Pain Medicine

## 2021-07-30 ENCOUNTER — Other Ambulatory Visit: Payer: Self-pay

## 2021-07-30 VITALS — BP 124/68 | HR 80 | Temp 97.2°F | Resp 16 | Ht 65.0 in | Wt 210.0 lb

## 2021-07-30 DIAGNOSIS — I10 Essential (primary) hypertension: Secondary | ICD-10-CM | POA: Diagnosis not present

## 2021-07-30 DIAGNOSIS — M810 Age-related osteoporosis without current pathological fracture: Secondary | ICD-10-CM | POA: Diagnosis not present

## 2021-07-30 DIAGNOSIS — R1011 Right upper quadrant pain: Secondary | ICD-10-CM | POA: Diagnosis not present

## 2021-07-30 DIAGNOSIS — M545 Low back pain, unspecified: Secondary | ICD-10-CM | POA: Insufficient documentation

## 2021-07-30 DIAGNOSIS — Z79891 Long term (current) use of opiate analgesic: Secondary | ICD-10-CM | POA: Diagnosis not present

## 2021-07-30 DIAGNOSIS — R1012 Left upper quadrant pain: Secondary | ICD-10-CM | POA: Diagnosis not present

## 2021-07-30 DIAGNOSIS — M150 Primary generalized (osteo)arthritis: Secondary | ICD-10-CM | POA: Diagnosis not present

## 2021-07-30 DIAGNOSIS — K219 Gastro-esophageal reflux disease without esophagitis: Secondary | ICD-10-CM | POA: Diagnosis not present

## 2021-07-30 DIAGNOSIS — J479 Bronchiectasis, uncomplicated: Secondary | ICD-10-CM | POA: Diagnosis not present

## 2021-07-30 DIAGNOSIS — R109 Unspecified abdominal pain: Secondary | ICD-10-CM | POA: Diagnosis not present

## 2021-07-30 DIAGNOSIS — M542 Cervicalgia: Secondary | ICD-10-CM | POA: Insufficient documentation

## 2021-07-30 DIAGNOSIS — G8929 Other chronic pain: Secondary | ICD-10-CM | POA: Insufficient documentation

## 2021-07-30 DIAGNOSIS — G4700 Insomnia, unspecified: Secondary | ICD-10-CM | POA: Diagnosis not present

## 2021-07-30 DIAGNOSIS — G894 Chronic pain syndrome: Secondary | ICD-10-CM | POA: Diagnosis not present

## 2021-07-30 DIAGNOSIS — Z79899 Other long term (current) drug therapy: Secondary | ICD-10-CM | POA: Diagnosis not present

## 2021-07-30 DIAGNOSIS — D508 Other iron deficiency anemias: Secondary | ICD-10-CM | POA: Diagnosis not present

## 2021-07-30 MED ORDER — MORPHINE SULFATE 15 MG PO TABS: 15.0000 mg | ORAL_TABLET | Freq: Three times a day (TID) | ORAL | 0 refills | Status: DC | PRN

## 2021-07-30 NOTE — Progress Notes (Signed)
Nursing Pain Medication Assessment:  Safety precautions to be maintained throughout the outpatient stay will include: orient to surroundings, keep bed in low position, maintain call bell within reach at all times, provide assistance with transfer out of bed and ambulation.  Medication Inspection Compliance: Pill count conducted under aseptic conditions, in front of the patient. Neither the pills nor the bottle was removed from the patient's sight at any time. Once count was completed pills were immediately returned to the patient in their original bottle.  Medication: Morphine IR Pill/Patch Count:  9 0 Pill/Patch Appearance: Markings consistent with prescribed medication Bottle Appearance: Standard pharmacy container. Clearly labeled. Filled Date: 48 / 25 / 2022 Last Medication intake:  Today

## 2021-07-30 NOTE — Patient Instructions (Signed)
____________________________________________________________________________________________ ° °Medication Rules ° °Purpose: To inform patients, and their family members, of our rules and regulations. ° °Applies to: All patients receiving prescriptions (written or electronic). ° °Pharmacy of record: Pharmacy where electronic prescriptions will be sent. If written prescriptions are taken to a different pharmacy, please inform the nursing staff. The pharmacy listed in the electronic medical record should be the one where you would like electronic prescriptions to be sent. ° °Electronic prescriptions: In compliance with the [place] [employer]reng[employer] Opioid Misuse Prevention (STOP) Act of 2017 (Session Law 2017-74/H243), effective August 12, 2018, all controlled substances must be electronically prescribed. Calling prescriptions to the pharmacy will cease to exist. ° °Prescription refills: Only during scheduled appointments. Applies to all prescriptions. ° °NOTE: The following applies primarily to controlled substances (Opioid* Pain Medications).  ° °Type of encounter (visit): For patients receiving controlled substances, face-to-face visits are required. (Not an option or up to the patient.) ° °Patient's responsibilities: °Pain Pills: Bring all pain pills to every appointment (except for procedure appointments). °Pill Bottles: Bring pills in original pharmacy bottle. Always bring the newest bottle. Bring bottle, even if empty. °Medication refills: You are responsible for knowing and keeping track of what medications you take and those you need refilled. °The day before your appointment: write a list of all prescriptions that need to be refilled. °The day of the appointment: give the list to the admitting nurse. Prescriptions will be written only during appointments. No prescriptions will be written on procedure days. °If you forget a medication: it will not be "Called in", "Faxed", or "electronically sent". You will  need to get another appointment to get these prescribed. °No early refills. Do not call asking to have your prescription filled early. °Prescription Accuracy: You are responsible for carefully inspecting your prescriptions before leaving our office. Have the discharge nurse carefully go over each prescription with you, before taking them home. Make sure that your name is accurately spelled, that your address is correct. Check the name and dose of your medication to make sure it is accurate. Check the number of pills, and the written instructions to make sure they are clear and accurate. Make sure that you are given enough medication to last until your next medication refill appointment. °Taking Medication: Take medication as prescribed. When it comes to controlled substances, taking less pills or less frequently than prescribed is permitted and encouraged. °Never take more pills than instructed. °Never take medication more frequently than prescribed.  °Inform other Doctors: Always inform, all of your healthcare providers, of all the medications you take. °Pain Medication from other Providers: You are not allowed to accept any additional pain medication from any other Doctor or Healthcare provider. There are two exceptions to this rule. (see below) In the event that you require additional pain medication, you are responsible for notifying us, as stated below. °Cough Medicine: Often these contain an opioid, such as codeine or hydrocodone. Never accept or take cough medicine containing these opioids if you are already taking an opioid* medication. The combination may cause respiratory failure and death. °Medication Agreement: You are responsible for carefully reading and following our Medication Agreement. This must be signed before receiving any prescriptions from our practice. Safely store a copy of your signed Agreement. Violations to the Agreement will result in no further prescriptions. (Additional copies of our  Medication Agreement are available upon request.) °Laws, Rules, & Regulations: All patients are expected to follow all Federal and State Laws, Statutes, Rules, & Regulations. Ignorance of   the Laws does not constitute a valid excuse.  °Illegal drugs and Controlled Substances: The use of illegal substances (including, but not limited to marijuana and its derivatives) and/or the illegal use of any controlled substances is strictly prohibited. Violation of this rule may result in the immediate and permanent discontinuation of any and all prescriptions being written by our practice. The use of any illegal substances is prohibited. °Adopted CDC guidelines & recommendations: Target dosing levels will be at or below 60 MME/day. Use of benzodiazepines** is not recommended. ° °Exceptions: There are only two exceptions to the rule of not receiving pain medications from other Healthcare Providers. °Exception #1 (Emergencies): In the event of an emergency (i.e.: accident requiring emergency care), you are allowed to receive additional pain medication. However, you are responsible for: As soon as you are able, call our office [phone], at any time of the day or night, and leave a message stating your name, the date and nature of the emergency, and the name and dose of the medication prescribed. In the event that your call is answered by a member of our staff, make sure to document and save the date, time, and the name of the person that took your information.  °Exception #2 (Planned Surgery): In the event that you are scheduled by another doctor or dentist to have any type of surgery or procedure, you are allowed (for a period no longer than 30 days), to receive additional pain medication, for the acute post-op pain. However, in this case, you are responsible for picking up a copy of our "Post-op Pain Management for Surgeons" handout, and giving it to your surgeon or dentist. This document is available at our office, and  does not require an appointment to obtain it. Simply go to our office during business hours (Monday-Thursday from 8:00 AM to 4:00 PM) (Friday 8:00 AM to 12:00 Noon) or if you have a scheduled appointment with us, prior to your surgery, and ask for it by name. In addition, you are responsible for: calling our office [phone], at any time of the day or night, and leaving a message stating your name, name of your surgeon, type of surgery, and date of procedure or surgery. Failure to comply with your responsibilities may result in termination of therapy involving the controlled substances. °Medication Agreement Violation. Following the above rules, including your responsibilities will help you in avoiding a Medication Agreement Violation (“Breaking your Pain Medication Contract”). ° °*Opioid medications include: morphine, codeine, oxycodone, oxymorphone, hydrocodone, hydromorphone, meperidine, tramadol, tapentadol, buprenorphine, fentanyl, methadone. °**Benzodiazepine medications include: diazepam (Valium), alprazolam (Xanax), clonazepam (Klonopine), lorazepam (Ativan), clorazepate (Tranxene), chlordiazepoxide (Librium), estazolam (Prosom), oxazepam (Serax), temazepam (Restoril), triazolam (Halcion) °(Last updated: 05/09/2021) °____________________________________________________________________________________________ ° ____________________________________________________________________________________________ ° °Medication Recommendations and Reminders ° °Applies to: All patients receiving prescriptions (written and/or electronic). ° °Medication Rules & Regulations: These rules and regulations exist for your safety and that of others. They are not flexible and neither are we. Dismissing or ignoring them will be considered "non-compliance" with medication therapy, resulting in complete and irreversible termination of such therapy. (See document titled "Medication Rules" for more details.) In all conscience,  because of safety reasons, we cannot continue providing a therapy where the patient does not follow instructions. ° °Pharmacy of record:  °Definition: This is the pharmacy where your electronic prescriptions will be sent.  °We do not endorse any particular pharmacy, however, we have experienced problems with [facility] not securing enough medication supply for the community. °We do not restrict you   in your choice of pharmacy. However, once we write for your prescriptions, we will NOT be re-sending more prescriptions to fix restricted supply problems created by your pharmacy, or your insurance.  °The pharmacy listed in the electronic medical record should be the one where you want electronic prescriptions to be sent. °If you choose to change pharmacy, simply notify our nursing staff. ° °Recommendations: °Keep all of your pain medications in a safe place, under lock and key, even if you live alone. We will NOT replace lost, stolen, or damaged medication. °After you fill your prescription, take 1 week's worth of pills and put them away in a safe place. You should keep a separate, properly labeled bottle for this purpose. The remainder should be kept in the original bottle. Use this as your primary supply, until it runs out. Once it's gone, then you know that you have 1 week's worth of medicine, and it is time to come in for a prescription refill. If you do this correctly, it is unlikely that you will ever run out of medicine. °To make sure that the above recommendation works, it is very important that you make sure your medication refill appointments are scheduled at least 1 week before you run out of medicine. To do this in an effective manner, make sure that you do not leave the office without scheduling your next medication management appointment. Always ask the nursing staff to show you in your prescription , when your medication will be running out. Then arrange for the receptionist to get you a return appointment,  at least 7 days before you run out of medicine. Do not wait until you have 1 or 2 pills left, to come in. This is very poor planning and does not take into consideration that we may need to cancel appointments due to bad weather, sickness, or emergencies affecting our staff. °DO NOT ACCEPT A "Partial Fill": If for any reason your pharmacy does not have enough pills/tablets to completely fill or refill your prescription, do not allow for a "partial fill". The law allows the pharmacy to complete that prescription within 72 hours, without requiring a new prescription. If they do not fill the rest of your prescription within those 72 hours, you will need a separate prescription to fill the remaining amount, which we will NOT provide. If the reason for the partial fill is your insurance, you will need to talk to the pharmacist about payment alternatives for the remaining tablets, but again, DO NOT ACCEPT A PARTIAL FILL, unless you can trust your pharmacist to obtain the remainder of the pills within 72 hours. ° °Prescription refills and/or changes in medication(s):  °Prescription refills, and/or changes in dose or medication, will be conducted only during scheduled medication management appointments. (Applies to both, written and electronic prescriptions.) °No refills on procedure days. No medication will be changed or started on procedure days. No changes, adjustments, and/or refills will be conducted on a procedure day. Doing so will interfere with the diagnostic portion of the procedure. °No phone refills. No medications will be "called into the pharmacy". °No Fax refills. °No weekend refills. °No Holliday refills. °No after hours refills. ° °Remember:  °Business hours are:  °Monday to Thursday 8:00 AM to 4:00 PM °Provider's Schedule: °Danford Tat, MD - Appointments are:  °Medication management: Monday and Wednesday 8:00 AM to 4:00 PM °Procedure day: Tuesday and Thursday 7:30 AM to 4:00 PM °Bilal Lateef, MD -  Appointments are:  °Medication management: Tuesday and Thursday 8:00   AM to 4:00 PM Procedure day: Monday and Wednesday 7:30 AM to 4:00 PM (Last update: 03/01/2020) ____________________________________________________________________________________________

## 2021-07-31 ENCOUNTER — Telehealth: Payer: Self-pay | Admitting: Pain Medicine

## 2021-07-31 NOTE — Telephone Encounter (Signed)
Patient called PCP yesterday about gabapentin. Her PCP says there is nothing in the notes about her taking over this medication and that is a pain management medication and she is not going to be writing it. Patient has no gabapentin. What is she supposed to do?  Also since patient isnt taking meds full time can she drive when she is not taking medications. Please advise patient

## 2021-07-31 NOTE — Telephone Encounter (Signed)
Left voicemail returning patient call;   Letter was sent to her PCP on 05/29/20 for them to take over gabapentin and diclofenac.  Unsure about why she is not driving.  That would need to be discussed with MD.

## 2021-08-21 DIAGNOSIS — I10 Essential (primary) hypertension: Secondary | ICD-10-CM | POA: Diagnosis not present

## 2021-08-21 DIAGNOSIS — E673 Hypervitaminosis D: Secondary | ICD-10-CM | POA: Diagnosis not present

## 2021-08-21 DIAGNOSIS — K911 Postgastric surgery syndromes: Secondary | ICD-10-CM | POA: Diagnosis not present

## 2021-08-21 DIAGNOSIS — R7309 Other abnormal glucose: Secondary | ICD-10-CM | POA: Diagnosis not present

## 2021-08-27 DIAGNOSIS — N3020 Other chronic cystitis without hematuria: Secondary | ICD-10-CM | POA: Diagnosis not present

## 2021-08-27 DIAGNOSIS — N200 Calculus of kidney: Secondary | ICD-10-CM | POA: Diagnosis not present

## 2021-08-28 DIAGNOSIS — J3089 Other allergic rhinitis: Secondary | ICD-10-CM | POA: Diagnosis not present

## 2021-08-28 DIAGNOSIS — I10 Essential (primary) hypertension: Secondary | ICD-10-CM | POA: Diagnosis not present

## 2021-08-28 DIAGNOSIS — G8928 Other chronic postprocedural pain: Secondary | ICD-10-CM | POA: Diagnosis not present

## 2021-08-28 DIAGNOSIS — K219 Gastro-esophageal reflux disease without esophagitis: Secondary | ICD-10-CM | POA: Diagnosis not present

## 2021-08-28 DIAGNOSIS — M150 Primary generalized (osteo)arthritis: Secondary | ICD-10-CM | POA: Diagnosis not present

## 2021-08-28 DIAGNOSIS — E538 Deficiency of other specified B group vitamins: Secondary | ICD-10-CM | POA: Diagnosis not present

## 2021-08-28 DIAGNOSIS — E673 Hypervitaminosis D: Secondary | ICD-10-CM | POA: Diagnosis not present

## 2021-08-28 DIAGNOSIS — K911 Postgastric surgery syndromes: Secondary | ICD-10-CM | POA: Diagnosis not present

## 2021-08-28 DIAGNOSIS — R7303 Prediabetes: Secondary | ICD-10-CM | POA: Diagnosis not present

## 2021-08-28 DIAGNOSIS — M810 Age-related osteoporosis without current pathological fracture: Secondary | ICD-10-CM | POA: Diagnosis not present

## 2021-08-30 DIAGNOSIS — I10 Essential (primary) hypertension: Secondary | ICD-10-CM | POA: Diagnosis not present

## 2021-08-30 DIAGNOSIS — J479 Bronchiectasis, uncomplicated: Secondary | ICD-10-CM | POA: Diagnosis not present

## 2021-08-30 DIAGNOSIS — K219 Gastro-esophageal reflux disease without esophagitis: Secondary | ICD-10-CM | POA: Diagnosis not present

## 2021-08-30 DIAGNOSIS — G8929 Other chronic pain: Secondary | ICD-10-CM | POA: Diagnosis not present

## 2021-08-30 DIAGNOSIS — M810 Age-related osteoporosis without current pathological fracture: Secondary | ICD-10-CM | POA: Diagnosis not present

## 2021-08-30 DIAGNOSIS — M150 Primary generalized (osteo)arthritis: Secondary | ICD-10-CM | POA: Diagnosis not present

## 2021-08-30 DIAGNOSIS — G4700 Insomnia, unspecified: Secondary | ICD-10-CM | POA: Diagnosis not present

## 2021-09-27 DIAGNOSIS — K439 Ventral hernia without obstruction or gangrene: Secondary | ICD-10-CM | POA: Diagnosis not present

## 2021-09-27 DIAGNOSIS — Z9884 Bariatric surgery status: Secondary | ICD-10-CM | POA: Diagnosis not present

## 2021-09-27 DIAGNOSIS — Z889 Allergy status to unspecified drugs, medicaments and biological substances status: Secondary | ICD-10-CM | POA: Insufficient documentation

## 2021-10-01 IMAGING — RF DG WRIST COMPLETE 3+V*R*
1 series · 3 of 3 positions shown · non-contrast
Comparison: 04/05/2020

FLUOROSCOPY TIME:  0 minutes 30 seconds

Dose: 0.84 mGy

Images: 3

CLINICAL DATA: ORIF RIGHT wrist

EXAM:
RIGHT WRIST - COMPLETE 3+ VIEW; DG C-ARM 1-60 MIN

[Series 1: run · 3 of 3 slices shown]
[im 1/3]
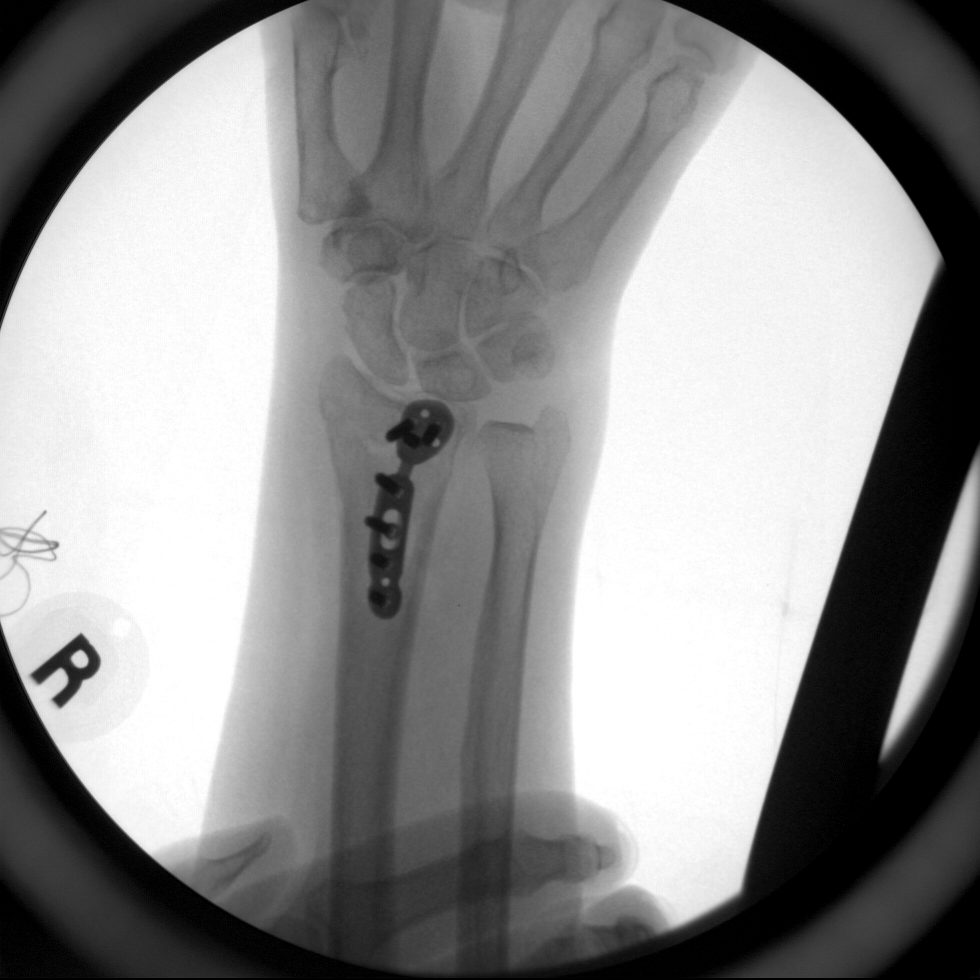
[im 2/3]
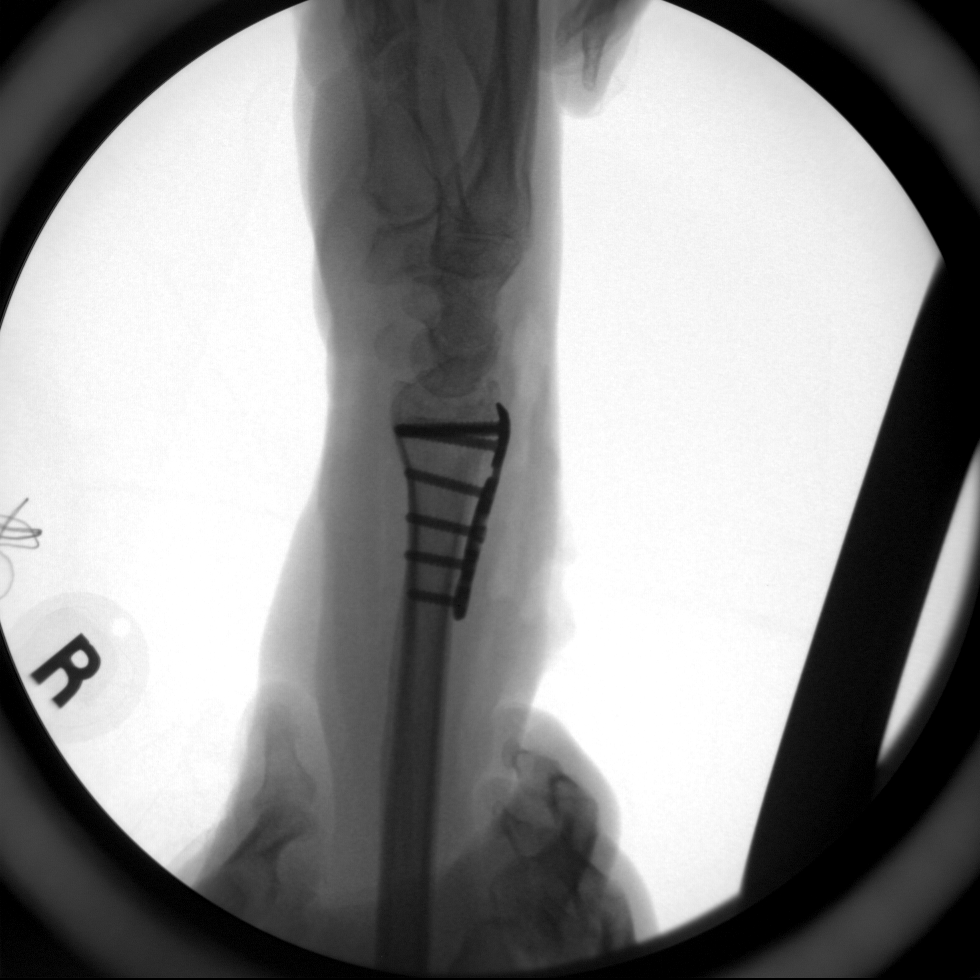
[im 3/3]
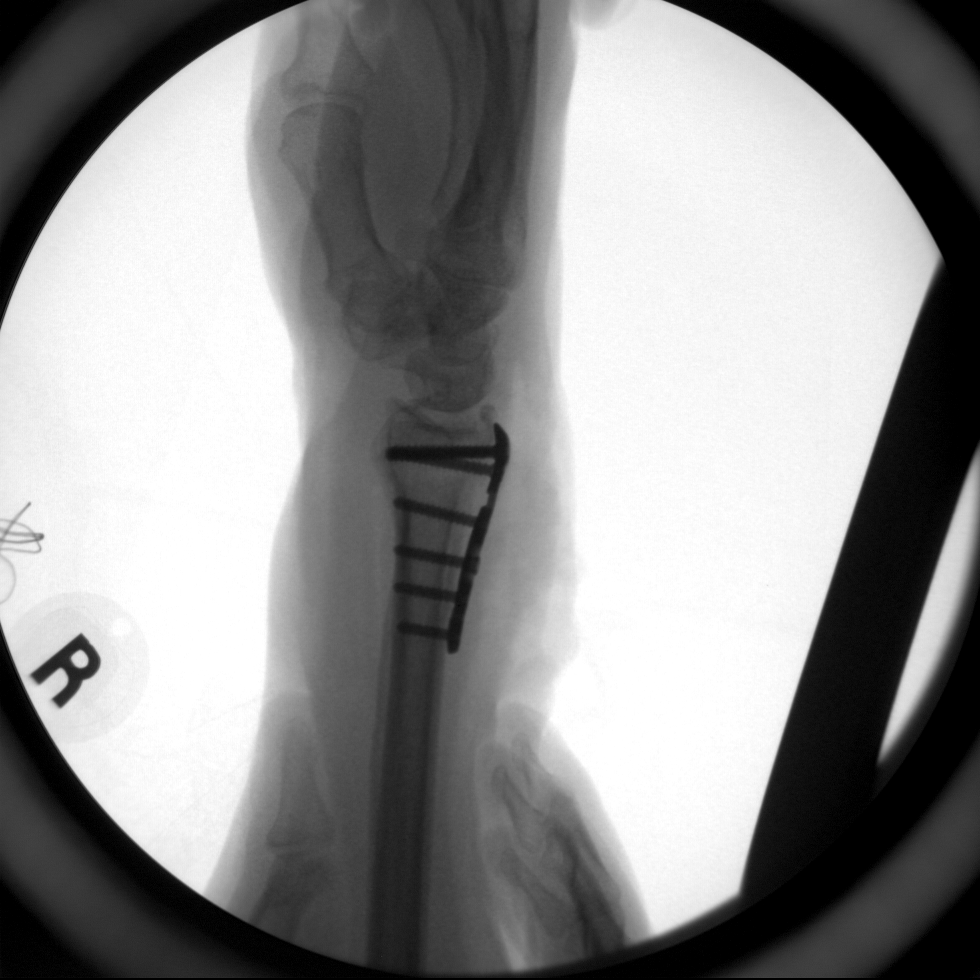

[3 of 3 positions shown; findings below may reference images not displayed]

FINDINGS: Dorsal plate and multiple screws identified at the distal RIGHT
radius post ORIF of previously seen distal radial metaphyseal
fracture.

Displacement of fragments and dorsal tilt of distal radial articular
surface appear improved since the previous exam.

Diffuse osseous demineralization.

No additional acute osseous findings.
IMPRESSION: Post ORIF distal RIGHT radius.

## 2021-10-16 NOTE — Progress Notes (Signed)
PROVIDER NOTE: Information contained herein reflects review and annotations entered in association with encounter. Interpretation of such information and data should be left to medically-trained personnel. Information provided to patient can be located elsewhere in the medical record under "Patient Instructions". Document created using STT-dictation technology, any transcriptional errors that may result from process are unintentional.    Patient: Kari Gibbs  Service Category: E/M  Provider: Gaspar Cola, MD  DOB: 18-Jan-1951  DOS: 10/17/2021  Specialty: Interventional Pain Management  MRN: [id number]  Setting: Ambulatory outpatient  PCP: Cari Caraway, MD  Type: Established Patient    Referring Provider: Cari Caraway, MD  Location: Office  Delivery: Face-to-face     HPI  [name], a 71 y.o. year old female, is here today because of her Chronic pain syndrome [G89.4]. Kari Gibbs primary complain today is Abdominal Pain (mid) Last encounter: My last encounter with her was on 07/31/2021. Pertinent problems: Kari Gibbs has Fracture of rib (4th and 8th rib) (Left); Chronic pain syndrome; Chronic flank pain (1ry area of Pain) (Left); Chronic thoracic radicular pain (Left-sided); Chronic occipital neuralgia (Left); Neuropathic pain; Neurogenic pain; History of rib fracture (4th and 8th rib) (Left); Vertebral body hemangioma (T11); Metatarsalgia of foot (Right); Chronic abdominal pain (2ry area of Pain) (Bilateral) (L>R); Primary osteoarthritis; Chronic neck pain (4th area of Pain) (Bilateral) (L>R); Cervical facet syndrome (Bilateral) (L>R); Muscle spasticity; Tremors of nervous system; Muscle twitching; Cervical spondylosis; Chronic low back pain (3ry area of Pain) (Bilateral) w/o sciatica; Acute costochondritis (Left); Chronic low back pain (Bilateral) w/ sciatica (Bilateral); Chronic sacroiliac joint pain (Bilateral); Other specified dorsopathies, sacral and sacrococcygeal region;  Chronic sacroiliac joint somatic dysfunction (Bilateral); Chronic postoperative pain; Epigastric pain; Left upper quadrant pain; Generalized pain; Headache; and Rib pain on left side on their pertinent problem list. Pain Assessment: Severity of Chronic pain is reported as a 5 /10. Location: Abdomen Left, Upper/pain radiaties around to her upper back. Onset: More than a month ago. Quality: Burning, Stabbing, Pressure, Discomfort, Constant. Timing: Constant. Modifying factor(s): meds help little bit , heating pad. Vitals:  height is $RemoveB'5\' 6"'fWgRJLSU$  (1.676 m) and weight is 204 lb (92.5 kg). Her temperature is 96.8 F (36 C) (abnormal). Her blood pressure is 151/82 (abnormal) and her pulse is 91. Her oxygen saturation is 99%.   Reason for encounter: medication management.   The patient indicates doing well with the current medication regimen. No adverse reactions or side effects reported to the medications.   Pending knee surgery this upcoming Monday Lumbar right knee.  She is also having a herniorrhaphy in April.  We have already provided the patient with the handouts for the surgeons on how to manage her perioperative pain.  RTCB: 02/01/2022 Nonopioids transferred 05/29/2020: Gabapentin and Voltaren gel  Pharmacotherapy Assessment  Analgesic: MS IR 15 mg mg, 1 tab PO q 8 hrs (45 mg/day of morphine) MME/day: 45 mg/day.   Monitoring: [place] PMP: PDMP reviewed during this encounter.       Pharmacotherapy: No side-effects or adverse reactions reported. Compliance: No problems identified. Effectiveness: Clinically acceptable.  Chauncey Fischer, RN  10/17/2021  1:38 PM  Sign when Signing Visit Nursing Pain Medication Assessment:  Safety precautions to be maintained throughout the outpatient stay will include: orient to surroundings, keep bed in low position, maintain call bell within reach at all times, provide assistance with transfer out of bed and ambulation.  Medication Inspection Compliance: Pill count conducted  under aseptic conditions, in front of the patient. Neither the  pills nor the bottle was removed from the patient's sight at any time. Once count was completed pills were immediately returned to the patient in their original bottle.  Medication: Morphine IR Pill/Patch Count:  48 of 90 pills remain Pill/Patch Appearance: Markings consistent with prescribed medication Bottle Appearance: Standard pharmacy container. Clearly labeled. Filled Date: 2 / 23 / 2023 Last Medication intake:  TodaySafety precautions to be maintained throughout the outpatient stay will include: orient to surroundings, keep bed in low position, maintain call bell within reach at all times, provide assistance with transfer out of bed and ambulation.     UDS:  Summary  Date Value Ref Range Status  03/07/2021 Note  Final    Comment:    ==================================================================== [employer] (MW) ==================================================================== Test                             Result       Flag       Units  Drug Present and Declared for Prescription Verification   Lorazepam                      1518         EXPECTED   ng/mg creat    Source of lorazepam is a scheduled prescription medication.    Morphine                       >9259        EXPECTED   ng/mg creat   Normorphine                    533          EXPECTED   ng/mg creat    Potential sources of large amounts of morphine in the absence of    codeine include administration of morphine or use of heroin.     Normorphine is an expected metabolite of morphine.    Hydromorphone                  126          EXPECTED   ng/mg creat    Hydromorphone may be present as a metabolite of morphine;    concentrations of hydromorphone rarely exceed 5% of the morphine    concentration when this is the source of hydromorphone.  Drug Absent but Declared for Prescription Verification   Oxycodone                      Not Detected  UNEXPECTED ng/mg creat ==================================================================== Test                      Result    Flag   Units      Ref Range   Creatinine              108              mg/dL      >=20 ==================================================================== Declared Medications:  The flagging and interpretation on this report are based on the  following declared medications.  Unexpected results may arise from  inaccuracies in the declared medications.   **Note: The testing scope of this panel includes these medications:   Lorazepam (Ativan)  Morphine (MSIR)  Oxycodone (Roxicodone)   **Note: The testing scope of this panel does not include the  following reported medications:  Acetaminophen (Tylenol)  Azelastine (Astelin)  Benzonatate (Tessalon)  Biotin  Buspirone (Buspar)  Cranberry  Dextromethorphan  Diclofenac (Voltaren)  Esomeprazole (Nexium)  Estrogens (Premarin)  Folic Acid  Gabapentin (Neurontin)  Ibuprofen (Advil)  Irbesartan (Avapro)  Iron  Loratadine (Claritin)  Magnesium  Montelukast (Singulair)  Multivitamin  Naloxone (Narcan)  Potassium  Promethazine  Supplement  Vitamin C  Vitamin D3 ==================================================================== For clinical consultation, please call [phone]. ====================================================================      ROS  Constitutional: Denies any fever or chills Gastrointestinal: No reported hemesis, hematochezia, vomiting, or acute GI distress Musculoskeletal: Denies any acute onset joint swelling, redness, loss of ROM, or weakness Neurological: No reported episodes of acute onset apraxia, aphasia, dysarthria, agnosia, amnesia, paralysis, loss of coordination, or loss of consciousness  Medication Review  Biotin, Cranberry, FLUoxetine, Flutter, LORazepam, Magnesium, Peppermint Oil, Prenatal MV & Min w/FA-DHA, RA Probiotic Gummies, Vitamin D3, acetaminophen,  azelastine, buPROPion, busPIRone, conjugated estrogens, diclofenac Sodium, doxepin, esomeprazole, irbesartan, montelukast, morphine, naloxone, potassium gluconate, and promethazine-dextromethorphan  History Review  Allergy: Ms. Emory is allergic to iohexol, ace inhibitors, losartan potassium, and neosporin [neomycin-bacitracin zn-polymyx]. Drug: Ms. Greis  reports no history of drug use. Alcohol:  reports that she does not currently use alcohol. Tobacco:  reports that she has never smoked. She has never used smokeless tobacco. Social: Ms. Bas  reports that she has never smoked. She has never used smokeless tobacco. She reports that she does not currently use alcohol. She reports that she does not use drugs. 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 Surgical: Ms. 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 Family: family history includes Alzheimer's disease in her father; Hypertension in her mother.  Laboratory Chemistry  Profile   Renal Lab Results  Component Value Date   BUN 20 04/04/2021   CREATININE 0.97 04/04/2021   GFRAA >60 01/04/2016   GFRNONAA >60 04/04/2021    Hepatic Lab Results  Component Value Date   AST 29 04/04/2021   ALT 30 04/04/2021   ALBUMIN 4.6 04/04/2021   ALKPHOS 89 04/04/2021   AMYLASE 30 02/27/2010   LIPASE 22 10/12/2012    Electrolytes Lab Results  Component Value Date   NA 140 04/04/2021   K 4.2 04/04/2021   CL 103 04/04/2021   CALCIUM 10.0 04/04/2021   MG 2.1 01/04/2016    Bone Lab Results  Component Value Date   25OHVITD1 49 01/04/2016   25OHVITD2 25 01/04/2016   25OHVITD3 24 01/04/2016    Inflammation (CRP: Acute Phase) (ESR: Chronic Phase) Lab Results  Component Value Date   CRP <0.5 01/04/2016   ESRSEDRATE 17 01/04/2016         Note: Above Lab results reviewed.  Recent Imaging Review  CT HEAD WO CONTRAST (5MM) CLINICAL DATA:  Headaches and hypertension  EXAM: CT HEAD WITHOUT  CONTRAST  TECHNIQUE: Contiguous axial images were obtained from the base of the skull through the vertex without intravenous contrast.  COMPARISON:  07/01/2017  FINDINGS: Brain: No evidence of acute infarction, hemorrhage, hydrocephalus, extra-axial collection or mass lesion/mass effect.  Vascular: No hyperdense vessel or unexpected calcification.  Skull: Normal. Negative for fracture or focal lesion.  Sinuses/Orbits: No acute finding.  Other: None.  IMPRESSION: No acute intracranial abnormality.  Electronically Signed   By: Inez Catalina M.D.   On: 04/04/2021 19:32 Note: Reviewed        Physical Exam  General appearance: Well nourished, well developed, and well hydrated. In no apparent acute distress Mental status: Alert, oriented x 3 (person, place, & time)       Respiratory: No evidence of acute respiratory distress Eyes: PERLA Vitals: BP (!) 151/82    Pulse 91    Temp (!) 96.8 F (36 C)    Ht $R'5\' 6"'rm$  (1.676 m)    Wt 204 lb (92.5 kg)    SpO2 99%     BMI 32.93 kg/m  BMI: Estimated body mass index is 32.93 kg/m as calculated from the following:   Height as of this encounter: $RemoveBeforeD'5\' 6"'hnmcuMkMgOSxEG$  (1.676 m).   Weight as of this encounter: 204 lb (92.5 kg). Ideal: Ideal body weight: 59.3 kg (130 lb 11.7 oz) Adjusted ideal body weight: 72.6 kg (160 lb 0.6 oz)  Assessment   Status Diagnosis  Controlled Controlled Controlled 1. Chronic pain syndrome   2. Chronic flank pain (1ry area of Pain) (Left)   3. Chronic abdominal pain (2ry area of Pain) (Bilateral) (L>R)   4. Chronic low back pain (3ry area of Pain) (Bilateral) w/o sciatica   5. Chronic neck pain (4th area of Pain) (Bilateral) (L>R)   6. Pharmacologic therapy   7. Chronic use of opiate for therapeutic purpose   8. Encounter for medication management   9. Encounter for chronic pain management   10. Rib pain on left side      Updated Problems: Problem  Rib Pain On Left Side    Plan of Care  Problem-specific:  No problem-specific Assessment & Plan notes found for this encounter.  Ms. Billiejean Schimek has a current medication list which includes the following long-term medication(s): bupropion, doxepin, fluoxetine, [START ON 11/03/2021] morphine, [START ON 12/03/2021] morphine, [START ON 01/02/2022] morphine, and diclofenac sodium.  Pharmacotherapy (Medications Ordered): Meds ordered this encounter  Medications   morphine (MSIR) 15 MG tablet    Sig: Take 1 tablet (15 mg total) by mouth every 8 (eight) hours as needed. Must last 30 days.    Dispense:  90 tablet    Refill:  0    DO NOT: delete (not duplicate); no partial-fill (will deny script to complete), no refill request (F/U required). DISPENSE: 1 day early if closed on fill date. WARN: No CNS-depressants within 8 hrs of med.   morphine (MSIR) 15 MG tablet    Sig: Take 1 tablet (15 mg total) by mouth every 8 (eight) hours as needed. Must last 30 days.    Dispense:  90 tablet    Refill:  0    DO NOT: delete (not duplicate); no  partial-fill (will deny script to complete), no refill request (F/U required). DISPENSE: 1 day early if closed on fill date. WARN: No CNS-depressants within 8 hrs of med.   morphine (MSIR) 15 MG tablet    Sig: Take 1 tablet (15 mg total) by mouth every 8 (eight) hours as needed.  Must last 30 days.    Dispense:  90 tablet    Refill:  0    DO NOT: delete (not duplicate); no partial-fill (will deny script to complete), no refill request (F/U required). DISPENSE: 1 day early if closed on fill date. WARN: No CNS-depressants within 8 hrs of med.   ketorolac (TORADOL) injection 60 mg   orphenadrine (NORFLEX) injection 60 mg   Orders:  No orders of the defined types were placed in this encounter.  Follow-up plan:   Return in about 4 months (around 02/01/2022) for Eval-day (M,W), (F2F), (MM).     Interventional management options:  Considering:   Diagnostic left costochondral joint injection  Possible left intercostal nerve RFA  Therapeutic Left greater occipital nerve RFA  Diagnostic left C2 + TON nerve block  Possible left C2 + TON RFA    Palliative PRN treatment(s):   Palliative left intercostal nerve blocks (T5 to T8) #2  Palliative left GONB block #3  Palliative bilateral sacroiliac joint block #2      Recent Visits Date Type Provider Dept  07/30/21 Office Visit [name], MD [facility]  Showing recent visits within past 90 days and meeting all other requirements Today's Visits Date Type Provider Dept  10/17/21 Office Visit [name], MD [facility]  Showing today's visits and meeting all other requirements Future Appointments No visits were found meeting these conditions. Showing future appointments within next 90 days and meeting all other requirements  I discussed the assessment and treatment plan with the patient. The patient was provided an opportunity to ask questions and all were answered. The patient agreed with the plan and  demonstrated an understanding of the instructions.  Patient advised to call back or seek an in-person evaluation if the symptoms or condition worsens.  Duration of encounter: 30 minutes.  Note by: Gaspar Cola, MD Date: 10/17/2021; Time: 2:12 PM

## 2021-10-17 ENCOUNTER — Other Ambulatory Visit: Payer: Self-pay

## 2021-10-17 ENCOUNTER — Encounter: Payer: Self-pay | Admitting: Pain Medicine

## 2021-10-17 ENCOUNTER — Ambulatory Visit: Payer: Medicare Other | Attending: Pain Medicine | Admitting: Pain Medicine

## 2021-10-17 VITALS — BP 151/82 | HR 91 | Temp 96.8°F | Ht 66.0 in | Wt 204.0 lb

## 2021-10-17 DIAGNOSIS — M542 Cervicalgia: Secondary | ICD-10-CM | POA: Diagnosis not present

## 2021-10-17 DIAGNOSIS — Z79891 Long term (current) use of opiate analgesic: Secondary | ICD-10-CM

## 2021-10-17 DIAGNOSIS — R0789 Other chest pain: Secondary | ICD-10-CM

## 2021-10-17 DIAGNOSIS — R1011 Right upper quadrant pain: Secondary | ICD-10-CM

## 2021-10-17 DIAGNOSIS — M545 Low back pain, unspecified: Secondary | ICD-10-CM

## 2021-10-17 DIAGNOSIS — Z79899 Other long term (current) drug therapy: Secondary | ICD-10-CM

## 2021-10-17 DIAGNOSIS — G894 Chronic pain syndrome: Secondary | ICD-10-CM | POA: Diagnosis not present

## 2021-10-17 DIAGNOSIS — R0781 Pleurodynia: Secondary | ICD-10-CM

## 2021-10-17 DIAGNOSIS — R10A2 Flank pain, left side: Secondary | ICD-10-CM

## 2021-10-17 DIAGNOSIS — G8929 Other chronic pain: Secondary | ICD-10-CM | POA: Diagnosis not present

## 2021-10-17 DIAGNOSIS — R109 Unspecified abdominal pain: Secondary | ICD-10-CM | POA: Diagnosis not present

## 2021-10-17 DIAGNOSIS — R1012 Left upper quadrant pain: Secondary | ICD-10-CM | POA: Diagnosis not present

## 2021-10-17 MED ORDER — KETOROLAC TROMETHAMINE 60 MG/2ML IM SOLN
60.0000 mg | Freq: Once | INTRAMUSCULAR | Status: AC
Start: 2021-10-17 — End: 2021-10-17
  Administered 2021-10-17: 60 mg via INTRAMUSCULAR

## 2021-10-17 MED ORDER — ORPHENADRINE CITRATE 30 MG/ML IJ SOLN
INTRAMUSCULAR | Status: AC
  Filled 2021-10-17: qty 2

## 2021-10-17 MED ORDER — ORPHENADRINE CITRATE 30 MG/ML IJ SOLN
60.0000 mg | Freq: Once | INTRAMUSCULAR | Status: AC
  Administered 2021-10-17: 60 mg via INTRAMUSCULAR

## 2021-10-17 MED ORDER — MORPHINE SULFATE 15 MG PO TABS: 15.0000 mg | ORAL_TABLET | Freq: Three times a day (TID) | ORAL | 0 refills | Status: DC | PRN

## 2021-10-17 MED ORDER — KETOROLAC TROMETHAMINE 60 MG/2ML IM SOLN
INTRAMUSCULAR | Status: AC
  Filled 2021-10-17: qty 2

## 2021-10-17 NOTE — Progress Notes (Signed)
Nursing Pain Medication Assessment:  ?Safety precautions to be maintained throughout the outpatient stay will include: orient to surroundings, keep bed in low position, maintain call bell within reach at all times, provide assistance with transfer out of bed and ambulation.  ?Medication Inspection Compliance: Pill count conducted under aseptic conditions, in front of the patient. Neither the pills nor the bottle was removed from the patient's sight at any time. Once count was completed pills were immediately returned to the patient in their original bottle. ? ?Medication: Morphine IR ?Pill/Patch Count:  48 of 90 pills remain ?Pill/Patch Appearance: Markings consistent with prescribed medication ?Bottle Appearance: Standard pharmacy container. Clearly labeled. ?Filled Date: 2 / 23 / 2023 ?Last Medication intake:  TodaySafety precautions to be maintained throughout the outpatient stay will include: orient to surroundings, keep bed in low position, maintain call bell within reach at all times, provide assistance with transfer out of bed and ambulation.  ?

## 2021-10-17 NOTE — Patient Instructions (Signed)
____________________________________________________________________________________________ ° °Medication Rules ° °Purpose: To inform patients, and their family members, of our rules and regulations. ° °Applies to: All patients receiving prescriptions (written or electronic). ° °Pharmacy of record: Pharmacy where electronic prescriptions will be sent. If written prescriptions are taken to a different pharmacy, please inform the nursing staff. The pharmacy listed in the electronic medical record should be the one where you would like electronic prescriptions to be sent. ° °Electronic prescriptions: In compliance with the [place] Strengthen Opioid Misuse Prevention (STOP) Act of 2017 (Session Law 2017-74/H243), effective August 12, 2018, all controlled substances must be electronically prescribed. Calling prescriptions to the pharmacy will cease to exist. ° °Prescription refills: Only during scheduled appointments. Applies to all prescriptions. ° °NOTE: The following applies primarily to controlled substances (Opioid* Pain Medications).  ° °Type of encounter (visit): For patients receiving controlled substances, face-to-face visits are required. (Not an option or up to the patient.) ° °Patient's responsibilities: °Pain Pills: Bring all pain pills to every appointment (except for procedure appointments). °Pill Bottles: Bring pills in original pharmacy bottle. Always bring the newest bottle. Bring bottle, even if empty. °Medication refills: You are responsible for knowing and keeping track of what medications you take and those you need refilled. °The day before your appointment: write a list of all prescriptions that need to be refilled. °The day of the appointment: give the list to the admitting nurse. Prescriptions will be written only during appointments. No prescriptions will be written on procedure days. °If you forget a medication: it will not be "Called in", "Faxed", or "electronically sent". You will  need to get another appointment to get these prescribed. °No early refills. Do not call asking to have your prescription filled early. °Prescription Accuracy: You are responsible for carefully inspecting your prescriptions before leaving our office. Have the discharge nurse carefully go over each prescription with you, before taking them home. Make sure that your name is accurately spelled, that your address is correct. Check the name and dose of your medication to make sure it is accurate. Check the number of pills, and the written instructions to make sure they are clear and accurate. Make sure that you are given enough medication to last until your next medication refill appointment. °Taking Medication: Take medication as prescribed. When it comes to controlled substances, taking less pills or less frequently than prescribed is permitted and encouraged. °Never take more pills than instructed. °Never take medication more frequently than prescribed.  °Inform other Doctors: Always inform, all of your healthcare providers, of all the medications you take. °Pain Medication from other Providers: You are not allowed to accept any additional pain medication from any other Doctor or Healthcare provider. There are two exceptions to this rule. (see below) In the event that you require additional pain medication, you are responsible for notifying us, as stated below. °Cough Medicine: Often these contain an opioid, such as codeine or hydrocodone. Never accept or take cough medicine containing these opioids if you are already taking an opioid* medication. The combination may cause respiratory failure and death. °Medication Agreement: You are responsible for carefully reading and following our Medication Agreement. This must be signed before receiving any prescriptions from our practice. Safely store a copy of your signed Agreement. Violations to the Agreement will result in no further prescriptions. (Additional copies of our  Medication Agreement are available upon request.) °Laws, Rules, & Regulations: All patients are expected to follow all Federal and State Laws, Statutes, Rules, & Regulations. Ignorance of   the Laws does not constitute a valid excuse.  °Illegal drugs and Controlled Substances: The use of illegal substances (including, but not limited to marijuana and its derivatives) and/or the illegal use of any controlled substances is strictly prohibited. Violation of this rule may result in the immediate and permanent discontinuation of any and all prescriptions being written by our practice. The use of any illegal substances is prohibited. °Adopted CDC guidelines & recommendations: Target dosing levels will be at or below 60 MME/day. Use of benzodiazepines** is not recommended. ° °Exceptions: There are only two exceptions to the rule of not receiving pain medications from other Healthcare Providers. °Exception #1 (Emergencies): In the event of an emergency (i.e.: accident requiring emergency care), you are allowed to receive additional pain medication. However, you are responsible for: As soon as you are able, call our office [phone], at any time of the day or night, and leave a message stating your name, the date and nature of the emergency, and the name and dose of the medication prescribed. In the event that your call is answered by a member of our staff, make sure to document and save the date, time, and the name of the person that took your information.  °Exception #2 (Planned Surgery): In the event that you are scheduled by another doctor or dentist to have any type of surgery or procedure, you are allowed (for a period no longer than 30 days), to receive additional pain medication, for the acute post-op pain. However, in this case, you are responsible for picking up a copy of our "Post-op Pain Management for Surgeons" handout, and giving it to your surgeon or dentist. This document is available at our office, and  does not require an appointment to obtain it. Simply go to our office during business hours (Monday-Thursday from 8:00 AM to 4:00 PM) (Friday 8:00 AM to 12:00 Noon) or if you have a scheduled appointment with us, prior to your surgery, and ask for it by name. In addition, you are responsible for: calling our office [phone], at any time of the day or night, and leaving a message stating your name, name of your surgeon, type of surgery, and date of procedure or surgery. Failure to comply with your responsibilities may result in termination of therapy involving the controlled substances. °Medication Agreement Violation. Following the above rules, including your responsibilities will help you in avoiding a Medication Agreement Violation (“Breaking your Pain Medication Contract”). ° °*Opioid medications include: morphine, codeine, oxycodone, oxymorphone, hydrocodone, hydromorphone, meperidine, tramadol, tapentadol, buprenorphine, fentanyl, methadone. °**Benzodiazepine medications include: diazepam (Valium), alprazolam (Xanax), clonazepam (Klonopine), lorazepam (Ativan), clorazepate (Tranxene), chlordiazepoxide (Librium), estazolam (Prosom), oxazepam (Serax), temazepam (Restoril), triazolam (Halcion) °(Last updated: 05/09/2021) °____________________________________________________________________________________________ ° ____________________________________________________________________________________________ ° °Medication Recommendations and Reminders ° °Applies to: All patients receiving prescriptions (written and/or electronic). ° °Medication Rules & Regulations: These rules and regulations exist for your safety and that of others. They are not flexible and neither are we. Dismissing or ignoring them will be considered "non-compliance" with medication therapy, resulting in complete and irreversible termination of such therapy. (See document titled "Medication Rules" for more details.) In all conscience,  because of safety reasons, we cannot continue providing a therapy where the patient does not follow instructions. ° °Pharmacy of record:  °Definition: This is the pharmacy where your electronic prescriptions will be sent.  °We do not endorse any particular pharmacy, however, we have experienced problems with [facility] not securing enough medication supply for the community. °We do not restrict you   in your choice of pharmacy. However, once we write for your prescriptions, we will NOT be re-sending more prescriptions to fix restricted supply problems created by your pharmacy, or your insurance.  °The pharmacy listed in the electronic medical record should be the one where you want electronic prescriptions to be sent. °If you choose to change pharmacy, simply notify our nursing staff. ° °Recommendations: °Keep all of your pain medications in a safe place, under lock and key, even if you live alone. We will NOT replace lost, stolen, or damaged medication. °After you fill your prescription, take 1 week's worth of pills and put them away in a safe place. You should keep a separate, properly labeled bottle for this purpose. The remainder should be kept in the original bottle. Use this as your primary supply, until it runs out. Once it's gone, then you know that you have 1 week's worth of medicine, and it is time to come in for a prescription refill. If you do this correctly, it is unlikely that you will ever run out of medicine. °To make sure that the above recommendation works, it is very important that you make sure your medication refill appointments are scheduled at least 1 week before you run out of medicine. To do this in an effective manner, make sure that you do not leave the office without scheduling your next medication management appointment. Always ask the nursing staff to show you in your prescription , when your medication will be running out. Then arrange for the receptionist to get you a return appointment,  at least 7 days before you run out of medicine. Do not wait until you have 1 or 2 pills left, to come in. This is very poor planning and does not take into consideration that we may need to cancel appointments due to bad weather, sickness, or emergencies affecting our staff. °DO NOT ACCEPT A "Partial Fill": If for any reason your pharmacy does not have enough pills/tablets to completely fill or refill your prescription, do not allow for a "partial fill". The law allows the pharmacy to complete that prescription within 72 hours, without requiring a new prescription. If they do not fill the rest of your prescription within those 72 hours, you will need a separate prescription to fill the remaining amount, which we will NOT provide. If the reason for the partial fill is your insurance, you will need to talk to the pharmacist about payment alternatives for the remaining tablets, but again, DO NOT ACCEPT A PARTIAL FILL, unless you can trust your pharmacist to obtain the remainder of the pills within 72 hours. ° °Prescription refills and/or changes in medication(s):  °Prescription refills, and/or changes in dose or medication, will be conducted only during scheduled medication management appointments. (Applies to both, written and electronic prescriptions.) °No refills on procedure days. No medication will be changed or started on procedure days. No changes, adjustments, and/or refills will be conducted on a procedure day. Doing so will interfere with the diagnostic portion of the procedure. °No phone refills. No medications will be "called into the pharmacy". °No Fax refills. °No weekend refills. °No Holliday refills. °No after hours refills. ° °Remember:  °Business hours are:  °Monday to Thursday 8:00 AM to 4:00 PM °Provider's Schedule: °Lataja Newland, MD - Appointments are:  °Medication management: Monday and Wednesday 8:00 AM to 4:00 PM °Procedure day: Tuesday and Thursday 7:30 AM to 4:00 PM °Bilal Lateef, MD -  Appointments are:  °Medication management: Tuesday and Thursday 8:00   AM to 4:00 PM °Procedure day: Monday and Wednesday 7:30 AM to 4:00 PM °(Last update: 03/01/2020) °____________________________________________________________________________________________ ° ____________________________________________________________________________________________ ° °CBD (cannabidiol) & Delta-8 (Delta-8 tetrahydrocannabinol) WARNING ° °Intro: Cannabidiol (CBD) and tetrahydrocannabinol (THC), are two natural compounds found in plants of the Cannabis genus. They can both be extracted from hemp or cannabis. Hemp and cannabis come from the Cannabis sativa plant. Both compounds interact with your body’s endocannabinoid system, but they have very different effects. CBD does not produce the high sensation associated with cannabis. Delta-8 tetrahydrocannabinol, also known as delta-8 THC, is a psychoactive substance found in the Cannabis sativa plant, of which marijuana and hemp are two varieties. THC is responsible for the high associated with the illicit use of marijuana. ° °Applicable to: All individuals currently taking or considering taking CBD (cannabidiol) and, more important, all patients taking opioid analgesic controlled substances (pain medication). (Example: oxycodone; oxymorphone; hydrocodone; hydromorphone; morphine; methadone; tramadol; tapentadol; fentanyl; buprenorphine; butorphanol; dextromethorphan; meperidine; codeine; etc.) ° °Legal status: CBD remains a Schedule I drug prohibited for any use. CBD is illegal with one exception. In the [place], CBD has a limited Food and Drug Administration (FDA) approval for the treatment of two specific types of epilepsy disorders. Only one CBD product has been approved by the FDA for this purpose: "Epidiolex". FDA is aware that some companies are marketing products containing cannabis and cannabis-derived compounds in ways that violate the Federal Food, Drug and Cosmetic Act  (FD&C Act) and that may put the health and safety of consumers at risk. The FDA, a Federal agency, has not enforced the CBD status since 2018. UPDATE: (09/28/2021) The Drug Enforcement Agency (DEA) issued a letter stating that "delta" cannabinoids, including Delta-8-THCO and Delta-9-THCO, synthetically derived from hemp do not qualify as hemp and will be viewed as Schedule I drugs. (Schedule I drugs, substances, or chemicals are defined as drugs with no currently accepted medical use and a high potential for abuse. Some examples of Schedule I drugs are: heroin, lysergic acid diethylamide (LSD), marijuana (cannabis), 3,4-methylenedioxymethamphetamine (ecstasy), methaqualone, and peyote.) (https://www.dea.gov) ° °Legality: Some manufacturers ship CBD products nationally, which is illegal. Often such products are sold online and are therefore available throughout the country. CBD is openly sold in head shops and health food stores in some states where such sales have not been explicitly legalized. Selling unapproved products with unsubstantiated therapeutic claims is not only a violation of the law, but also can put patients at risk, as these products have not been proven to be safe or effective. Federal illegality makes it difficult to conduct research on CBD. ° °Reference: "FDA Regulation of Cannabis and Cannabis-Derived Products, Including Cannabidiol (CBD)" - https://www.fda.gov/news-events/public-health-focus/fda-regulation-cannabis-and-cannabis-derived-products-including-cannabidiol-cbd ° °Warning: CBD is not FDA approved and has not undergo the same manufacturing controls as prescription drugs.  This means that the purity and safety of available CBD may be questionable. Most of the time, despite manufacturer's claims, it is contaminated with THC (delta-9-tetrahydrocannabinol - the chemical in marijuana responsible for the "HIGH").  When this is the case, the THC contaminant will trigger a positive urine drug  screen (UDS) test for Marijuana (carboxy-THC). Because a positive UDS for any illicit substance is a violation of our medication agreement, your opioid analgesics (pain medicine) may be permanently discontinued. °The FDA recently put out a warning about 5 things that everyone should be aware of regarding Delta-8 THC: °Delta-8 THC products have not been evaluated or approved by the FDA for safe use and may be marketed in ways that put the   public health at risk. °The FDA has received adverse event reports involving delta-8 THC-containing products. °Delta-8 THC has psychoactive and intoxicating effects. °Delta-8 THC manufacturing often involve use of potentially harmful chemicals to create the concentrations of delta-8 THC claimed in the marketplace. The final delta-8 THC product may have potentially harmful by-products (contaminants) due to the chemicals used in the process. Manufacturing of delta-8 THC products may occur in uncontrolled or unsanitary settings, which may lead to the presence of unsafe contaminants or other potentially harmful substances. °Delta-8 THC products should be kept out of the reach of children and pets. ° °MORE ABOUT CBD ° °General Information: CBD was discovered in 1940 and it is a derivative of the cannabis sativa genus plants (Marijuana and Hemp). It is one of the 113 identified substances found in Marijuana. It accounts for up to 40% of the plant's extract. As of 2018, preliminary clinical studies on CBD included research for the treatment of anxiety, movement disorders, and pain. CBD is available and consumed in multiple forms, including inhalation of smoke or vapor, as an aerosol spray, and by mouth. It may be supplied as an oil containing CBD, capsules, dried cannabis, or as a liquid solution. CBD is thought not to be as psychoactive as THC (delta-9-tetrahydrocannabinol - the chemical in marijuana responsible for the "HIGH"). Studies suggest that CBD may interact with different  biological target receptors in the body, including cannabinoid and other neurotransmitter receptors. As of 2018 the mechanism of action for its biological effects has not been determined. ° °Side-effects   Adverse reactions: Dry mouth, diarrhea, decreased appetite, fatigue, drowsiness, malaise, weakness, sleep disturbances, and others. ° °Drug interactions: CBC may interact with other medications such as blood-thinners. Because CBD causes drowsiness on its own, it also increases the drowsiness caused by other medications, including antihistamines (such as Benadryl), benzodiazepines (Xanax, Ativan, Valium), antipsychotics, antidepressants and opioids, as well as alcohol and supplements such as kava, melatonin and St. John's Wort. Be cautious with the following combinations:  ° °Brivaracetam (Briviact) °Brivaracetam is changed and broken down by the body. CBD might decrease how quickly the body breaks down brivaracetam. This might increase levels of brivaracetam in the body. ° °Caffeine °Caffeine is changed and broken down by the body. CBD might decrease how quickly the body breaks down caffeine. This might increase levels of caffeine in the body. ° °Carbamazepine (Tegretol) °Carbamazepine is changed and broken down by the body. CBD might decrease how quickly the body breaks down carbamazepine. This might increase levels of carbamazepine in the body and increase its side effects. ° °Citalopram (Celexa) °Citalopram is changed and broken down by the body. CBD might decrease how quickly the body breaks down citalopram. This might increase levels of citalopram in the body and increase its side effects. ° °Clobazam (Onfi) °Clobazam is changed and broken down by the liver. CBD might decrease how quickly the liver breaks down clobazam. This might increase the effects and side effects of clobazam. ° °Eslicarbazepine (Aptiom) °Eslicarbazepine is changed and broken down by the body. CBD might decrease how quickly the body  breaks down eslicarbazepine. This might increase levels of eslicarbazepine in the body by a small amount. ° °Everolimus (Zostress) °Everolimus is changed and broken down by the body. CBD might decrease how quickly the body breaks down everolimus. This might increase levels of everolimus in the body. ° °Lithium °Taking higher doses of CBD might increase levels of lithium. This can increase the risk of lithium toxicity. ° °Medications changed by the   liver (Cytochrome P450 1A1 (CYP1A1) substrates) °Some medications are changed and broken down by the liver. CBD might change how quickly the liver breaks down these medications. This could change the effects and side effects of these medications. ° °Medications changed by the liver (Cytochrome P450 1A2 (CYP1A2) substrates) °Some medications are changed and broken down by the liver. CBD might change how quickly the liver breaks down these medications. This could change the effects and side effects of these medications. ° °Medications changed by the liver (Cytochrome P450 1B1 (CYP1B1) substrates) °Some medications are changed and broken down by the liver. CBD might change how quickly the liver breaks down these medications. This could change the effects and side effects of these medications. ° °Medications changed by the liver (Cytochrome P450 2A6 (CYP2A6) substrates) °Some medications are changed and broken down by the liver. CBD might change how quickly the liver breaks down these medications. This could change the effects and side effects of these medications. ° °Medications changed by the liver (Cytochrome P450 2B6 (CYP2B6) substrates) °Some medications are changed and broken down by the liver. CBD might change how quickly the liver breaks down these medications. This could change the effects and side effects of these medications. ° °Medications changed by the liver (Cytochrome P450 2C19 (CYP2C19) substrates) °Some medications are changed and broken down by the liver.  CBD might change how quickly the liver breaks down these medications. This could change the effects and side effects of these medications. ° °Medications changed by the liver (Cytochrome P450 2C8 (CYP2C8) substrates) °Some medications are changed and broken down by the liver. CBD might change how quickly the liver breaks down these medications. This could change the effects and side effects of these medications. ° °Medications changed by the liver (Cytochrome P450 2C9 (CYP2C9) substrates) °Some medications are changed and broken down by the liver. CBD might change how quickly the liver breaks down these medications. This could change the effects and side effects of these medications. ° °Medications changed by the liver (Cytochrome P450 2D6 (CYP2D6) substrates) °Some medications are changed and broken down by the liver. CBD might change how quickly the liver breaks down these medications. This could change the effects and side effects of these medications. ° °Medications changed by the liver (Cytochrome P450 2E1 (CYP2E1) substrates) °Some medications are changed and broken down by the liver. CBD might change how quickly the liver breaks down these medications. This could change the effects and side effects of these medications. ° °Medications changed by the liver (Cytochrome P450 3A4 (CYP3A4) substrates) °Some medications are changed and broken down by the liver. CBD might change how quickly the liver breaks down these medications. This could change the effects and side effects of these medications. ° °Medications changed by the liver (Glucuronidated drugs) °Some medications are changed and broken down by the liver. CBD might change how quickly the liver breaks down these medications. This could change the effects and side effects of these medications. ° °Medications that decrease the breakdown of other medications by the liver (Cytochrome P450 2C19 (CYP2C19) inhibitors) °CBD is changed and broken down by the liver.  Some drugs decrease how quickly the liver changes and breaks down CBD. This could change the effects and side effects of CBD. ° °Medications that decrease the breakdown of other medications in the liver (Cytochrome P450 3A4 (CYP3A4) inhibitors) °CBD is changed and broken down by the liver. Some drugs decrease how quickly the liver changes and breaks down CBD. This could change the   effects and side effects of CBD. ° °Medications that increase breakdown of other medications by the liver (Cytochrome P450 3A4 (CYP3A4) inducers) °CBD is changed and broken down by the liver. Some drugs increase how quickly the liver changes and breaks down CBD. This could change the effects and side effects of CBD. ° °Medications that increase the breakdown of other medications by the liver (Cytochrome P450 2C19 (CYP2C19) inducers) °CBD is changed and broken down by the liver. Some drugs increase how quickly the liver changes and breaks down CBD. This could change the effects and side effects of CBD. ° °Methadone (Dolophine) °Methadone is broken down by the liver. CBD might decrease how quickly the liver breaks down methadone. Taking cannabidiol along with methadone might increase the effects and side effects of methadone. ° °Rufinamide (Banzel) °Rufinamide is changed and broken down by the body. CBD might decrease how quickly the body breaks down rufinamide. This might increase levels of rufinamide in the body by a small amount. ° °Sedative medications (CNS depressants) °CBD might cause sleepiness and slowed breathing. Some medications, called sedatives, can also cause sleepiness and slowed breathing. Taking CBD with sedative medications might cause breathing problems and/or too much sleepiness. ° °Sirolimus (Rapamune) °Sirolimus is changed and broken down by the body. CBD might decrease how quickly the body breaks down sirolimus. This might increase levels of sirolimus in the body. ° °Stiripentol (Diacomit) °Stiripentol is changed and  broken down by the body. CBD might decrease how quickly the body breaks down stiripentol. This might increase levels of stiripentol in the body and increase its side effects. ° °Tacrolimus (Prograf) °Tacrolimus is changed and broken down by the body. CBD might decrease how quickly the body breaks down tacrolimus. This might increase levels of tacrolimus in the body. ° °Tamoxifen (Soltamox) °Tamoxifen is changed and broken down by the body. CBD might affect how quickly the body breaks down tamoxifen. This might affect levels of tamoxifen in the body. ° °Topiramate (Topamax) °Topiramate is changed and broken down by the body. CBD might decrease how quickly the body breaks down topiramate. This might increase levels of topiramate in the body by a small amount. ° °Valproate °Valproic acid can cause liver injury. Taking cannabidiol with valproic acid might increase the chance of liver injury. CBD and/or valproic acid might need to be stopped, or the dose might need to be reduced. ° °Warfarin (Coumadin) °CBD might increase levels of warfarin, which can increase the risk for bleeding. CBD and/or warfarin might need to be stopped, or the dose might need to be reduced. ° °Zonisamide °Zonisamide is changed and broken down by the body. CBD might decrease how quickly the body breaks down zonisamide. This might increase levels of zonisamide in the body by a small amount. °(Last update: 10/10/2021) °____________________________________________________________________________________________ ° ____________________________________________________________________________________________ ° °Drug Holidays (Slow) ° °What is a "Drug Holiday"? °Drug Holiday: is the name given to the period of time during which a patient stops taking a medication(s) for the purpose of eliminating tolerance to the drug. ° °Benefits °Improved effectiveness of opioids. °Decreased opioid dose needed to achieve benefits. °Improved pain with lesser  dose. ° °What is tolerance? °Tolerance: is the progressive decreased in effectiveness of a drug due to its repetitive use. With repetitive use, the body gets use to the medication and as a consequence, it loses its effectiveness. This is a common problem seen with opioid pain medications. As a result, a larger dose of the drug is needed to achieve the same effect   that used to be obtained with a smaller dose. ° °How long should a "Drug Holiday" last? °You should stay off of the pain medicine for at least 14 consecutive days. (2 weeks) ° °Should I stop the medicine "cold turkey"? °No. You should always coordinate with your Pain Specialist so that he/she can provide you with the correct medication dose to make the transition as smoothly as possible. ° °How do I stop the medicine? °Slowly. You will be instructed to decrease the daily amount of pills that you take by one (1) pill every seven (7) days. This is called a "slow downward taper" of your dose. For example: if you normally take four (4) pills per day, you will be asked to drop this dose to three (3) pills per day for seven (7) days, then to two (2) pills per day for seven (7) days, then to one (1) per day for seven (7) days, and at the end of those last seven (7) days, this is when the "Drug Holiday" would start.  ° °Will I have withdrawals? °By doing a "slow downward taper" like this one, it is unlikely that you will experience any significant withdrawal symptoms. Typically, what triggers withdrawals is the sudden stop of a high dose opioid therapy. Withdrawals can usually be avoided by slowly decreasing the dose over a prolonged period of time. If you do not follow these instructions and decide to stop your medication abruptly, withdrawals may be possible. ° °What are withdrawals? °Withdrawals: refers to the wide range of symptoms that occur after stopping or dramatically reducing opiate drugs after heavy and prolonged use. Withdrawal symptoms do not occur to  patients that use low dose opioids, or those who take the medication sporadically. Contrary to benzodiazepine (example: Valium, Xanax, etc.) or alcohol withdrawals (“Delirium Tremens”), opioid withdrawals are not lethal. Withdrawals are the physical manifestation of the body getting rid of the excess receptors. ° °Expected Symptoms °Early symptoms of withdrawal may include: °Agitation °Anxiety °Muscle aches °Increased tearing °Insomnia °Runny nose °Sweating °Yawning ° °Late symptoms of withdrawal may include: °Abdominal cramping °Diarrhea °Dilated pupils °Goose bumps °Nausea °Vomiting ° °Will I experience withdrawals? °Due to the slow nature of the taper, it is very unlikely that you will experience any. ° °What is a slow taper? °Taper: refers to the gradual decrease in dose.  °(Last update: 03/01/2020) °____________________________________________________________________________________________ ° °  °

## 2021-10-22 DIAGNOSIS — M94262 Chondromalacia, left knee: Secondary | ICD-10-CM | POA: Diagnosis not present

## 2021-10-22 DIAGNOSIS — S83271A Complex tear of lateral meniscus, current injury, right knee, initial encounter: Secondary | ICD-10-CM | POA: Diagnosis not present

## 2021-10-22 DIAGNOSIS — S83281A Other tear of lateral meniscus, current injury, right knee, initial encounter: Secondary | ICD-10-CM | POA: Diagnosis not present

## 2021-10-22 DIAGNOSIS — S83272A Complex tear of lateral meniscus, current injury, left knee, initial encounter: Secondary | ICD-10-CM | POA: Diagnosis not present

## 2021-10-22 DIAGNOSIS — G8918 Other acute postprocedural pain: Secondary | ICD-10-CM | POA: Diagnosis not present

## 2021-10-22 DIAGNOSIS — M94261 Chondromalacia, right knee: Secondary | ICD-10-CM | POA: Diagnosis not present

## 2021-10-22 DIAGNOSIS — M2341 Loose body in knee, right knee: Secondary | ICD-10-CM | POA: Diagnosis not present

## 2021-10-29 ENCOUNTER — Encounter: Payer: Medicare Other | Admitting: Pain Medicine

## 2021-10-30 DIAGNOSIS — R531 Weakness: Secondary | ICD-10-CM | POA: Diagnosis not present

## 2021-10-30 DIAGNOSIS — M25661 Stiffness of right knee, not elsewhere classified: Secondary | ICD-10-CM | POA: Diagnosis not present

## 2021-10-30 DIAGNOSIS — R262 Difficulty in walking, not elsewhere classified: Secondary | ICD-10-CM | POA: Diagnosis not present

## 2021-11-06 DIAGNOSIS — R262 Difficulty in walking, not elsewhere classified: Secondary | ICD-10-CM | POA: Diagnosis not present

## 2021-11-06 DIAGNOSIS — M25661 Stiffness of right knee, not elsewhere classified: Secondary | ICD-10-CM | POA: Diagnosis not present

## 2021-11-06 DIAGNOSIS — R531 Weakness: Secondary | ICD-10-CM | POA: Diagnosis not present

## 2021-11-08 DIAGNOSIS — R531 Weakness: Secondary | ICD-10-CM | POA: Diagnosis not present

## 2021-11-08 DIAGNOSIS — M25661 Stiffness of right knee, not elsewhere classified: Secondary | ICD-10-CM | POA: Diagnosis not present

## 2021-11-08 DIAGNOSIS — R262 Difficulty in walking, not elsewhere classified: Secondary | ICD-10-CM | POA: Diagnosis not present

## 2021-11-13 DIAGNOSIS — M25661 Stiffness of right knee, not elsewhere classified: Secondary | ICD-10-CM | POA: Diagnosis not present

## 2021-11-13 DIAGNOSIS — R262 Difficulty in walking, not elsewhere classified: Secondary | ICD-10-CM | POA: Diagnosis not present

## 2021-11-13 DIAGNOSIS — R531 Weakness: Secondary | ICD-10-CM | POA: Diagnosis not present

## 2021-11-14 NOTE — Progress Notes (Signed)
Pt. Needs instructions for upcoming surgery. ?

## 2021-11-14 NOTE — Patient Instructions (Addendum)
DUE TO COVID-19 ONLY ONE VISITOR  (aged 70 and older)  IS ALLOWED TO COME WITH YOU AND STAY IN THE WAITING ROOM ONLY DURING PRE OP AND PROCEDURE.   ?**NO VISITORS ARE ALLOWED IN THE SHORT STAY AREA OR RECOVERY ROOM!!** ? ?IF YOU WILL BE ADMITTED INTO THE HOSPITAL YOU ARE ALLOWED ONLY TWO SUPPORT PEOPLE DURING VISITATION HOURS ONLY (7 AM -8PM)   ?The support person(s) must pass our screening, gel in and out, and wear a mask at all times, including in the patient?s room. ?Patients must also wear a mask when staff or their support person are in the room. ?Visitors GUEST BADGE MUST BE WORN VISIBLY  ?One adult visitor may remain with you overnight and MUST be in the room by 8 P.M. ?  ? ? Your procedure is scheduled on: 12/07/21 ? ? Report to [facility] Main Entrance ? ?  Report to Short Stay at: 5:15 AM ? ? Call this number if you have problems the morning of surgery [phone] ? ? Do not eat food :After Midnight. ? ? After Midnight you may have the following liquids until: 4:30 AM DAY OF SURGERY ? ?Water ?Black Coffee (sugar ok, NO MILK/CREAM OR CREAMERS)  ?Tea (sugar ok, NO MILK/CREAM OR CREAMERS) regular and decaf                             ?Plain Jell-O (NO RED)                                           ?Fruit ices (not with fruit pulp, NO RED)                                     ?Popsicles (NO RED)                                                                  ?Juice: apple, WHITE grape, WHITE cranberry ?Sports drinks like Gatorade (NO RED) ?Clear broth(vegetable,chicken,beef) ?  ?Oral Hygiene is also important to reduce your risk of infection.                                    ?Remember - BRUSH YOUR TEETH THE MORNING OF SURGERY WITH YOUR REGULAR TOOTHPASTE ? ? Do NOT smoke after Midnight ? ? Take these medicines the morning of surgery with A SIP OF WATER: buspirone,lorazepam,bupropion,fluoxetine,loratadine.Tylenol as needed. ? ?DO NOT TAKE ANY ORAL DIABETIC MEDICATIONS DAY OF YOUR SURGERY ? ?Bring  CPAP mask and tubing day of surgery. ?                  ?           You may not have any metal on your body including hair pins, jewelry, and body piercing ? ?           Do not wear make-up, lotions, powders, perfumes/cologne, or deodorant ? ?Do not wear nail polish  including gel and S&S, artificial/acrylic nails, or any other type of covering on natural nails including finger and toenails. If you have artificial nails, gel coating, etc. that needs to be removed by a nail salon please have this removed prior to surgery or surgery may need to be canceled/ delayed if the surgeon/ anesthesia feels like they are unable to be safely monitored.  ? ?Do not shave  48 hours prior to surgery.  ? ? Do not bring valuables to the hospital. [facility] NOT ?            RESPONSIBLE   FOR VALUABLES. ? ? Contacts, dentures or bridgework may not be worn into surgery. ? ? Bring small overnight bag day of surgery. ?  ? Patients discharged on the day of surgery will not be allowed to drive home.  Someone NEEDS to stay with you for the first 24 hours after anesthesia. ? ? Special Instructions: Bring a copy of your healthcare power of attorney and living will documents         the day of surgery if you haven't scanned them before. ? ?            Please read over the following fact sheets you were given: IF [place] [phone] ? ?   [facility] - Preparing for Surgery ?Before surgery, you can play an important role.  Because skin is not sterile, your skin needs to be as free of germs as possible.  You can reduce the number of germs on your skin by washing with CHG (chlorahexidine gluconate) soap before surgery.  CHG is an antiseptic cleaner which kills germs and bonds with the skin to continue killing germs even after washing. ?Please DO NOT use if you have an allergy to CHG or antibacterial soaps.  If your skin becomes reddened/irritated stop using the CHG and inform your nurse  when you arrive at Short Stay. ?Do not shave (including legs and underarms) for at least 48 hours prior to the first CHG shower.  You may shave your face/neck. ?Please follow these instructions carefully: ? 1.  Shower with CHG Soap the night before surgery and the  morning of Surgery. ? 2.  If you choose to wash your hair, wash your hair first as usual with your  normal  shampoo. ? 3.  After you shampoo, rinse your hair and body thoroughly to remove the  shampoo.                           4.  Use CHG as you would any other liquid soap.  You can apply chg directly  to the skin and wash  ?                     Gently with a scrungie or clean washcloth. ? 5.  Apply the CHG Soap to your body ONLY FROM THE NECK DOWN.   Do not use on face/ open      ?                     Wound or open sores. Avoid contact with eyes, ears mouth and genitals (private parts).  ?                     Production manager,  Genitals (private parts) with your normal soap. ?  6.  Wash thoroughly, paying special attention to the area where your surgery  will be performed. ? 7.  Thoroughly rinse your body with warm water from the neck down. ? 8.  DO NOT shower/wash with your normal soap after using and rinsing off  the CHG Soap. ?               9.  Pat yourself dry with a clean towel. ?           10.  Wear clean pajamas. ?           11.  Place clean sheets on your bed the night of your first shower and do not  sleep with pets. ?Day of Surgery : ?Do not apply any lotions/deodorants the morning of surgery.  Please wear clean clothes to the hospital/surgery center. ? ?FAILURE TO FOLLOW THESE INSTRUCTIONS MAY RESULT IN THE CANCELLATION OF YOUR SURGERY ?PATIENT SIGNATURE_________________________________ ? ?NURSE SIGNATURE__________________________________ ? ?________________________________________________________________________  ?

## 2021-11-15 ENCOUNTER — Encounter (HOSPITAL_COMMUNITY)
Admission: RE | Admit: 2021-11-15 | Discharge: 2021-11-15 | Disposition: A | Discharge status: Home / Self Care | Payer: Medicare Other | Source: Ambulatory Visit | Attending: Surgery | Admitting: Surgery

## 2021-11-15 ENCOUNTER — Other Ambulatory Visit: Payer: Self-pay

## 2021-11-15 ENCOUNTER — Encounter (HOSPITAL_COMMUNITY): Payer: Self-pay

## 2021-11-15 VITALS — BP 123/66 | HR 82 | Temp 98.4°F | Resp 16 | Ht 65.5 in | Wt 205.0 lb

## 2021-11-15 DIAGNOSIS — Z01818 Encounter for other preprocedural examination: Secondary | ICD-10-CM | POA: Diagnosis not present

## 2021-11-15 DIAGNOSIS — I10 Essential (primary) hypertension: Secondary | ICD-10-CM | POA: Insufficient documentation

## 2021-11-15 DIAGNOSIS — K219 Gastro-esophageal reflux disease without esophagitis: Secondary | ICD-10-CM

## 2021-11-15 LAB — BASIC METABOLIC PANEL
Anion gap: 5 (ref 5–15)
BUN: 19 mg/dL (ref 8–23)
CO2: 30 mmol/L (ref 22–32)
Calcium: 9.4 mg/dL (ref 8.9–10.3)
Chloride: 105 mmol/L (ref 98–111)
Creatinine, Ser: 0.9 mg/dL (ref 0.44–1.00)
GFR, Estimated: >60 mL/min (ref >60)
Glucose, Bld: 85 mg/dL (ref 70–99)
Potassium: 5.2 mmol/L — ABNORMAL HIGH (ref 3.5–5.1)
Sodium: 140 mmol/L (ref 135–145)

## 2021-11-15 LAB — CBC
HCT: 40.2 % (ref 36.0–46.0)
Hemoglobin: 13.3 g/dL (ref 12.0–15.0)
MCH: 30.8 pg (ref 26.0–34.0)
MCHC: 33.1 g/dL (ref 30.0–36.0)
MCV: 93.1 fL (ref 80.0–100.0)
Platelets: 258 10*3/uL (ref 150–400)
RBC: 4.32 MIL/uL (ref 3.87–5.11)
RDW: 14.6 % (ref 11.5–15.5)
WBC: 5.2 10*3/uL (ref 4.0–10.5)
nRBC: 0 % (ref 0.0–0.2)

## 2021-11-15 NOTE — Progress Notes (Signed)
For Short Stay: ?[place] appointment date: ?Date of COVID positive in last 90 days: ?COVID Vaccine: Pfizer x 5: 05/23/22 ?Bowel Prep reminder: ? ? ?For Anesthesia: ?PCP - Dr. Cari Caraway ?Cardiologist -  ? ?Chest x-ray -  ?EKG -  ?Stress Test -  ?ECHO -  ?Cardiac Cath -  ?Pacemaker/ICD device last checked: ?Pacemaker orders received: ?Device Rep notified: ? ?Spinal Cord Stimulator: ? ?Sleep Study -  ?CPAP -  ? ?Fasting Blood Sugar -  ?Checks Blood Sugar _____ times a day ?Date and result of last Hgb A1c- ? ?Blood Thinner Instructions: ?Aspirin Instructions: ?Last Dose: ? ?Activity level: Can go up a flight of stairs and activities of daily living without stopping and without chest pain and/or shortness of breath ?  Able to exercise without chest pain and/or shortness of breath ?  Unable to go up a flight of stairs without chest pain and/or shortness of breath ?   ? ?Anesthesia review:  ? ?Patient denies shortness of breath, fever, cough and chest pain at PAT appointment ? ? ?Patient verbalized understanding of instructions that were given to them at the PAT appointment. Patient was also instructed that they will need to review over the PAT instructions again at home before surgery.  ?

## 2021-11-20 DIAGNOSIS — M25562 Pain in left knee: Secondary | ICD-10-CM | POA: Diagnosis not present

## 2021-11-22 DIAGNOSIS — R262 Difficulty in walking, not elsewhere classified: Secondary | ICD-10-CM | POA: Diagnosis not present

## 2021-11-22 DIAGNOSIS — M810 Age-related osteoporosis without current pathological fracture: Secondary | ICD-10-CM | POA: Diagnosis not present

## 2021-11-22 DIAGNOSIS — M25661 Stiffness of right knee, not elsewhere classified: Secondary | ICD-10-CM | POA: Diagnosis not present

## 2021-11-22 DIAGNOSIS — R531 Weakness: Secondary | ICD-10-CM | POA: Diagnosis not present

## 2021-12-05 NOTE — H&P (Signed)
?REFERRING PHYSICIAN:  Leitha Bleak, MD ?  ?PROVIDER:  Joya San, MD ?  ?MRN: [id number] ?DOB: [birth year] ?DATE OF ENCOUNTER: 09/27/2021 ?  ?Subjective  ?  ?Chief Complaint: New Consultation (hernia) ?  ?  ?  ?History of Present Illness: ?Kari Gibbs is a 71 y.o. female who is seen today as an office consultation at the request of Dr. Addison Lank for evaluation of New Consultation (hernia) ?He has developed a painful area that bulges out periodically below the xiphoid in the midline region.  This could be at the site of one of her trocars.  She had a gastric bypass by me in 2014.  She actually lost too much weight down into the low 100s and now is about 208.  She started out at close to 300 pounds.  She has a fairly complicated history and does see pain management for chronic pain related to her back.  She has this pain on the left side still that comes and goes which comes around in the costal region.  Dr. Theadore Nan does a great job in managing her many medical issues. ?  ?After examining her I feel like this is an area that may be herniated around the falciform ligament.  I would recommend inserting the 5 mm laparoscope and visualized the area and probably cutting down and removing the sac and implanting a piece of mesh as a inside plug and suturing this closed.  I would do a regional block.  She mentioned her pain management specialist saying that I should go ahead and give her pain meds and then we can make those adjustments accordingly.  She sees Dr. [name] for pain management.  Sees Dr. Dorna Leitz regarding a pending right knee arthroscopy, right ankle fusion, left ankle replacement. ?  ?  ?Review of Systems: ?See HPI as well for other ROS. ?  ?Review of Systems  ?Gastrointestinal: Positive for abdominal pain, constipation, heartburn, nausea and vomiting.  ?Neurological: Positive for weakness.  ?Psychiatric/Behavioral: Positive for depression. The patient is nervous/anxious.    ?All other systems reviewed and are negative. ?  ?  ?  ?Medical History: ?Past Medical History  ?    ?Past Medical History:  ?Diagnosis Date  ? Anxiety    ? Arthritis    ? DVT (deep venous thrombosis) (CMS-HCC)    ? GERD (gastroesophageal reflux disease)    ? Hypertension    ?  ?  ?  ?   ?Patient Active Problem List  ?Diagnosis  ? Acute costochondritis  ? Age-related osteoporosis without current pathological fracture  ? Allergic rhinitis  ? Allergy status to unspecified drugs, medicaments and biological substances  ? Amnesia  ? Alopecia  ? Body mass index (BMI) 34.0-34.9, adult  ? Bronchiectasis without complication (CMS-HCC)  ? Cervical spondylosis  ? Chronic left flank pain  ? Chronic postoperative pain  ? Chronic sacroiliac joint pain  ? Constipation  ?  ?  ?Past Surgical History  ?     ?Past Surgical History:  ?Procedure Laterality Date  ? ARTHROSCOPIC ROTATOR CUFF REPAIR      ? LAPAROSCOPIC GASTRIC BYPASS      ?  ?  ?  ?Allergies  ?     ?Allergies  ?Allergen Reactions  ? Ace Inhibitors Other (See Comments) and Cough  ?    REACTION: cyclic cough ?REACTION: cyclic cough ?   ? Iodinated Contrast Media Other (See Comments)  ? Losartan Other (See Comments)  ?  Raises blood pressure; fatigue ?Raises blood pressure; fatigue ?   ? Neomycin-Bacitracin-Polymyxin Dermatitis  ?  ?  ?  ?      ?Current Outpatient Medications on File Prior to Visit  ?Medication Sig Dispense Refill  ? acetaminophen (TYLENOL) 325 MG tablet Take 650 mg by mouth every 6 (six) hours      ? busPIRone (BUSPAR) 15 MG tablet Take 1 tablet by mouth 2 (two) times daily      ? diclofenac (VOLTAREN) 1 % topical gel diclofenac 1 % topical gel      ? doxepin (SINEQUAN) 25 MG capsule 2 capsules      ? FLUoxetine (PROZAC) 20 MG capsule 1 capsule      ? gabapentin (NEURONTIN) 400 MG capsule 1 capsule ('400mg'$ ) every morning      ? irbesartan (AVAPRO) 75 MG tablet 1 tablet      ? LORazepam (ATIVAN) 0.5 MG tablet lorazepam 0.5 mg tablet ? TAKE 1 TABLET BY MOUTH  TWICE A DAY      ? montelukast (SINGULAIR) 10 mg tablet 1 tablet      ? morphine (MS CONTIN) 30 MG ER tablet morphine ER 30 mg tablet,extended release ? TAKE 1 TABLET (30 MG TOTAL) BY MOUTH EVERY 12 (TWELVE) HOURS. MUST LAST 30 DAYS      ? naloxone (NARCAN) 1 mg/mL injection        ? albuterol (PROAIR HFA) 90 mcg/actuation inhaler ProAir HFA 90 mcg/actuation aerosol inhaler ? INHALE 2 PUFFS BY MOUTH EVERY 6 HOURS AS NEEDED      ? azelastine (ASTELIN) 137 mcg nasal spray azelastine 137 mcg (0.1 %) nasal spray aerosol      ? Bacillus coagulan/calcium carb (DIGESTIVE ADVANTAGE PROBIOTIC ORAL)        ? biotin 1,000 mcg Chew Take by mouth      ? buPROPion (WELLBUTRIN XL) 150 MG XL tablet 2 tablets in the morning      ? cloNIDine HCL (CATAPRES) 0.1 MG tablet TAKE 1 TABLET EVERY 8 HOURS AS DIRECTED      ? conjugated estrogens (PREMARIN) 0.625 mg/gram vaginal cream Premarin 0.625 mg/gram vaginal cream ? INSERT 1GRAM VAGINALLY 2 TIMES WEEKLY AT BEDTIME      ? cranberry 500 mg Cap        ? cyanocobalamin (VITAMIN B12) 500 MCG tablet Take by mouth      ? denosumab (PROLIA) 60 mg/mL inj syringe as directed      ? diltiazem (CARDIZEM) 60 MG tablet diltiazem 60 mg tablet ? TAKE 1 TABLET BY MOUTH EVERY 6 HOURS      ? ergocalciferol, vitamin D2, 1,250 mcg (50,000 unit) capsule Vitamin D2 1,250 mcg (50,000 unit) capsule      ? esomeprazole (NEXIUM) 20 MG DR capsule Take by mouth      ? estradioL (ESTRACE) 0.01 % (0.1 mg/gram) vaginal cream as directed      ? hydrOXYzine HCL (ATARAX) 10 MG tablet TAKE 1 TABLET EVERY EVENING FOR 14 DAYS      ? magnesium oxide 500 mg Tab        ? omeprazole (PRILOSEC) 20 MG DR capsule omeprazole 20 mg capsule,delayed release      ?  ?No current facility-administered medications on file prior to visit.  ?  ?  ?Family History  ?     ?Family History  ?Problem Relation Age of Onset  ? High blood pressure (Hypertension) Mother    ? Colon cancer Father    ? High  blood pressure (Hypertension) Father    ?  ?   ?  ?Social History  ?  ?   ?Tobacco Use  ?Smoking Status Never  ?Smokeless Tobacco Never  ?  ?  ?Social History  ?Social History  ?  ?    ?Socioeconomic History  ? Marital status: Married  ?Tobacco Use  ? Smoking status: Never  ? Smokeless tobacco: Never  ?Vaping Use  ? Vaping Use: Never used  ?Substance and Sexual Activity  ? Alcohol use: Not Currently  ? Drug use: Never  ?  ?  ?  ?Objective:  ?  ?  ?   ?Vitals:  ?  09/27/21 1012  ?BP: (!) 140/82  ?Pulse: 106  ?SpO2: 93%  ?Weight: 93.6 kg (206 lb 6.4 oz)  ?Height: 166.4 cm (5' 5.5")  ?  ?Body mass index is 33.82 kg/m?. ?  ?Physical Exam ?General: 5 foot 5 and half inches with a weight of 206.  Vital signs stable. ?HEENT  : Glasses otherwise unremarkable. ?Chest: Did not examine ?Heart: D did not examine not listen ?Breast: Not examined ?Abdomen: There is a soft reducible hernia in the midline up toward the xiphoid which may be a trocar hernia.  This will come out at times and cause her great pain. ?GU did not examine ?Rectal did not examine ?Extremities some limitation of her range of motion in her lower extremity particularly her right knee ankle and left ankle ?Neuro alert and oriented x3.  She seemed to comprehend everything well and wants to proceed with this hernia repair before she undertakes her orthopedic repairs. ?  ?  ?  ?  ?Labs, Imaging and Diagnostic Testing: ?none ?  ?Assessment and Plan:  ?Diagnoses and all orders for this visit: ?  ?Ventral hernia without obstruction or gangrene ?  ?History of Roux-en-Y gastric bypass ?  ?Morbid obesity (CMS-HCC) ?  ?  ?  ?Schedule her as an outpatient at [facility] for laparoscopically assisted ventral hernia repair with mesh.  I have answered her questions and her husband's questions and we will proceed as soon as possible. ?  ?She was seen in preop and the site marked.   ?  ?Amear Strojny Donia Pounds, MD  ?  ?

## 2021-12-06 DIAGNOSIS — Z1231 Encounter for screening mammogram for malignant neoplasm of breast: Secondary | ICD-10-CM | POA: Diagnosis not present

## 2021-12-06 NOTE — Anesthesia Preprocedure Evaluation (Addendum)
Anesthesia Evaluation  ?Patient identified by MRN, date of birth, ID band ?Patient awake ? ? ? ?Reviewed: ?Allergy & Precautions, NPO status , Patient's Chart, lab work & pertinent test results ? ?Airway ?Mallampati: II ? ?TM Distance: >3 FB ?Neck ROM: Full ? ? ? Dental ?no notable dental hx. ?(+) Dental Advisory Given, Teeth Intact ?  ?Pulmonary ?pneumonia, resolved,  ?  ?Pulmonary exam normal ?breath sounds clear to auscultation ? ? ? ? ? ? Cardiovascular ?hypertension, Pt. on medications ?Normal cardiovascular exam ?Rhythm:Regular Rate:Normal ? ? ?  ?Neuro/Psych ? Headaches, PSYCHIATRIC DISORDERS Anxiety Depression   ? GI/Hepatic ?Neg liver ROS, hiatal hernia, GERD  ,  ?Endo/Other  ? ? Renal/GU ?negative Renal ROS  ? ?  ?Musculoskeletal ? ?(+) Arthritis ,  ? Abdominal ?(+) + obese,   ?Peds ? Hematology ? ?(+) Blood dyscrasia, anemia ,   ?Anesthesia Other Findings ? ? Reproductive/Obstetrics ? ?  ? ? ? ? ? ? ? ? ? ? ? ? ? ?  ?  ? ? ? ? ? ? ? ?Anesthesia Physical ? ?Anesthesia Plan ? ?ASA: 3 ? ?Anesthesia Plan: General  ? ?Post-op Pain Management: Tylenol PO (pre-op)*  ? ?Induction:  ? ?PONV Risk Score and Plan: 4 or greater and Treatment may vary due to age or medical condition, Midazolam, Ondansetron and Dexamethasone ? ?Airway Management Planned: Oral ETT ? ?Additional Equipment: None ? ?Intra-op Plan:  ? ?Post-operative Plan: Extubation in OR ? ?Informed Consent: I have reviewed the patients History and Physical, chart, labs and discussed the procedure including the risks, benefits and alternatives for the proposed anesthesia with the patient or authorized representative who has indicated his/her understanding and acceptance.  ? ? ? ?Dental advisory given ? ?Plan Discussed with: CRNA ? ?Anesthesia Plan Comments:   ? ? ? ? ?Anesthesia Quick Evaluation ? ?

## 2021-12-07 ENCOUNTER — Other Ambulatory Visit: Payer: Self-pay

## 2021-12-07 ENCOUNTER — Encounter (HOSPITAL_COMMUNITY): Payer: Self-pay | Admitting: Surgery

## 2021-12-07 ENCOUNTER — Ambulatory Visit (HOSPITAL_COMMUNITY)
Admission: RE | Admit: 2021-12-07 | Discharge: 2021-12-07 | Disposition: A | Discharge status: Home / Self Care | Payer: Medicare Other | Source: Ambulatory Visit | Attending: Surgery | Admitting: Surgery

## 2021-12-07 ENCOUNTER — Ambulatory Visit (HOSPITAL_COMMUNITY): Payer: Medicare Other | Admitting: Anesthesiology

## 2021-12-07 ENCOUNTER — Ambulatory Visit (HOSPITAL_BASED_OUTPATIENT_CLINIC_OR_DEPARTMENT_OTHER): Payer: Medicare Other | Admitting: Anesthesiology

## 2021-12-07 ENCOUNTER — Encounter (HOSPITAL_COMMUNITY)
Admission: RE | Disposition: A | Discharge status: Home / Self Care | Payer: Self-pay | Source: Ambulatory Visit | Attending: Surgery

## 2021-12-07 DIAGNOSIS — F419 Anxiety disorder, unspecified: Secondary | ICD-10-CM | POA: Insufficient documentation

## 2021-12-07 DIAGNOSIS — G8929 Other chronic pain: Secondary | ICD-10-CM | POA: Diagnosis not present

## 2021-12-07 DIAGNOSIS — K439 Ventral hernia without obstruction or gangrene: Secondary | ICD-10-CM | POA: Diagnosis not present

## 2021-12-07 DIAGNOSIS — I10 Essential (primary) hypertension: Secondary | ICD-10-CM | POA: Diagnosis not present

## 2021-12-07 DIAGNOSIS — K449 Diaphragmatic hernia without obstruction or gangrene: Secondary | ICD-10-CM | POA: Diagnosis not present

## 2021-12-07 DIAGNOSIS — D649 Anemia, unspecified: Secondary | ICD-10-CM | POA: Insufficient documentation

## 2021-12-07 DIAGNOSIS — D759 Disease of blood and blood-forming organs, unspecified: Secondary | ICD-10-CM | POA: Insufficient documentation

## 2021-12-07 DIAGNOSIS — Z6833 Body mass index (BMI) 33.0-33.9, adult: Secondary | ICD-10-CM | POA: Insufficient documentation

## 2021-12-07 DIAGNOSIS — F32A Depression, unspecified: Secondary | ICD-10-CM | POA: Diagnosis not present

## 2021-12-07 DIAGNOSIS — Z9884 Bariatric surgery status: Secondary | ICD-10-CM | POA: Insufficient documentation

## 2021-12-07 DIAGNOSIS — K432 Incisional hernia without obstruction or gangrene: Secondary | ICD-10-CM | POA: Diagnosis not present

## 2021-12-07 DIAGNOSIS — K219 Gastro-esophageal reflux disease without esophagitis: Secondary | ICD-10-CM | POA: Diagnosis not present

## 2021-12-07 DIAGNOSIS — M1990 Unspecified osteoarthritis, unspecified site: Secondary | ICD-10-CM | POA: Insufficient documentation

## 2021-12-07 HISTORY — PX: VENTRAL HERNIA REPAIR: SHX424

## 2021-12-07 SURGERY — REPAIR, HERNIA, VENTRAL, LAPAROSCOPIC
Anesthesia: General

## 2021-12-07 MED ORDER — BUPIVACAINE LIPOSOME 1.3 % IJ SUSP
INTRAMUSCULAR | Status: DC | PRN
  Administered 2021-12-07: 20 mL

## 2021-12-07 MED ORDER — LIDOCAINE HCL (PF) 2 % IJ SOLN
INTRAMUSCULAR | Status: AC
  Filled 2021-12-07: qty 5

## 2021-12-07 MED ORDER — ROCURONIUM BROMIDE 100 MG/10ML IV SOLN
INTRAVENOUS | Status: DC | PRN
  Administered 2021-12-07: 60 mg via INTRAVENOUS

## 2021-12-07 MED ORDER — SUGAMMADEX SODIUM 200 MG/2ML IV SOLN
INTRAVENOUS | Status: DC | PRN
  Administered 2021-12-07: 200 mg via INTRAVENOUS

## 2021-12-07 MED ORDER — HYDROCODONE-ACETAMINOPHEN 5-325 MG PO TABS: 1.0000 | ORAL_TABLET | Freq: Four times a day (QID) | ORAL | 0 refills | Status: DC | PRN

## 2021-12-07 MED ORDER — FENTANYL CITRATE (PF) 100 MCG/2ML IJ SOLN
INTRAMUSCULAR | Status: AC
  Filled 2021-12-07: qty 2

## 2021-12-07 MED ORDER — ORAL CARE MOUTH RINSE: 15.0000 mL | Freq: Once | OROMUCOSAL | Status: AC

## 2021-12-07 MED ORDER — BUPIVACAINE LIPOSOME 1.3 % IJ SUSP
INTRAMUSCULAR | Status: AC
  Filled 2021-12-07: qty 20

## 2021-12-07 MED ORDER — CHLORHEXIDINE GLUCONATE CLOTH 2 % EX PADS: 6.0000 | MEDICATED_PAD | Freq: Once | CUTANEOUS | Status: DC

## 2021-12-07 MED ORDER — CEFAZOLIN SODIUM-DEXTROSE 2-4 GM/100ML-% IV SOLN
2.0000 g | INTRAVENOUS | Status: AC
  Administered 2021-12-07: 2 g via INTRAVENOUS
  Filled 2021-12-07: qty 100

## 2021-12-07 MED ORDER — SCOPOLAMINE 1 MG/3DAYS TD PT72
1.0000 | MEDICATED_PATCH | TRANSDERMAL | Status: DC
  Administered 2021-12-07: 1.5 mg via TRANSDERMAL
  Filled 2021-12-07: qty 1

## 2021-12-07 MED ORDER — 0.9 % SODIUM CHLORIDE (POUR BTL) OPTIME
TOPICAL | Status: DC | PRN
  Administered 2021-12-07: 1000 mL

## 2021-12-07 MED ORDER — PROPOFOL 10 MG/ML IV BOLUS
INTRAVENOUS | Status: DC | PRN
  Administered 2021-12-07: 150 mg via INTRAVENOUS

## 2021-12-07 MED ORDER — HYDROMORPHONE HCL 1 MG/ML IJ SOLN
INTRAMUSCULAR | Status: AC
  Administered 2021-12-07: 0.5 mg via INTRAVENOUS
  Filled 2021-12-07: qty 1

## 2021-12-07 MED ORDER — ACETAMINOPHEN 500 MG PO TABS
1000.0000 mg | ORAL_TABLET | ORAL | Status: DC
  Filled 2021-12-07: qty 2

## 2021-12-07 MED ORDER — LACTATED RINGERS IV SOLN: INTRAVENOUS | Status: DC

## 2021-12-07 MED ORDER — ONDANSETRON HCL 4 MG/2ML IJ SOLN
INTRAMUSCULAR | Status: AC
  Filled 2021-12-07: qty 2

## 2021-12-07 MED ORDER — LIDOCAINE HCL (CARDIAC) PF 100 MG/5ML IV SOSY
PREFILLED_SYRINGE | INTRAVENOUS | Status: DC | PRN
  Administered 2021-12-07: 100 mg via INTRAVENOUS

## 2021-12-07 MED ORDER — MIDAZOLAM HCL 2 MG/2ML IJ SOLN
INTRAMUSCULAR | Status: AC
  Filled 2021-12-07: qty 2

## 2021-12-07 MED ORDER — HYDROCODONE-ACETAMINOPHEN 7.5-325 MG PO TABS
ORAL_TABLET | ORAL | Status: AC
  Filled 2021-12-07: qty 1

## 2021-12-07 MED ORDER — LACTATED RINGERS IR SOLN
Status: DC | PRN
  Administered 2021-12-07: 1000 mL

## 2021-12-07 MED ORDER — HYDROCODONE-ACETAMINOPHEN 7.5-325 MG PO TABS
1.0000 | ORAL_TABLET | Freq: Four times a day (QID) | ORAL | Status: AC | PRN
  Administered 2021-12-07: 1 via ORAL

## 2021-12-07 MED ORDER — SODIUM CHLORIDE (PF) 0.9 % IJ SOLN
INTRAMUSCULAR | Status: DC | PRN
  Administered 2021-12-07: 10 mL

## 2021-12-07 MED ORDER — FENTANYL CITRATE (PF) 100 MCG/2ML IJ SOLN
INTRAMUSCULAR | Status: DC | PRN
  Administered 2021-12-07 (×2): 100 ug via INTRAVENOUS

## 2021-12-07 MED ORDER — ONDANSETRON HCL 4 MG/2ML IJ SOLN
INTRAMUSCULAR | Status: DC | PRN
  Administered 2021-12-07: 4 mg via INTRAVENOUS

## 2021-12-07 MED ORDER — HYDROMORPHONE HCL 1 MG/ML IJ SOLN
0.2500 mg | INTRAMUSCULAR | Status: DC | PRN
  Administered 2021-12-07 (×2): 0.5 mg via INTRAVENOUS

## 2021-12-07 MED ORDER — SODIUM CHLORIDE (PF) 0.9 % IJ SOLN
INTRAMUSCULAR | Status: AC
  Filled 2021-12-07: qty 10

## 2021-12-07 MED ORDER — ROCURONIUM BROMIDE 10 MG/ML (PF) SYRINGE
PREFILLED_SYRINGE | INTRAVENOUS | Status: AC
  Filled 2021-12-07: qty 10

## 2021-12-07 MED ORDER — AMISULPRIDE (ANTIEMETIC) 5 MG/2ML IV SOLN: 10.0000 mg | Freq: Once | INTRAVENOUS | Status: DC | PRN

## 2021-12-07 MED ORDER — PROPOFOL 10 MG/ML IV BOLUS
INTRAVENOUS | Status: AC
  Filled 2021-12-07: qty 20

## 2021-12-07 MED ORDER — MIDAZOLAM HCL 5 MG/5ML IJ SOLN
INTRAMUSCULAR | Status: DC | PRN
  Administered 2021-12-07: 2 mg via INTRAVENOUS

## 2021-12-07 MED ORDER — CHLORHEXIDINE GLUCONATE 0.12 % MT SOLN
15.0000 mL | Freq: Once | OROMUCOSAL | Status: AC
  Administered 2021-12-07: 15 mL via OROMUCOSAL

## 2021-12-07 SURGICAL SUPPLY — 53 items
ADH SKN CLS APL DERMABOND .7 (GAUZE/BANDAGES/DRESSINGS) ×1
APL PRP STRL LF DISP 70% ISPRP (MISCELLANEOUS) ×1
BAG COUNTER SPONGE SURGICOUNT (BAG) IMPLANT
BAG SPNG CNTER NS LX DISP (BAG)
BINDER ABDOMINAL 12 ML 46-62 (SOFTGOODS) ×1 IMPLANT
CABLE HIGH FREQUENCY MONO STRZ (ELECTRODE) IMPLANT
CHLORAPREP W/TINT 26 (MISCELLANEOUS) ×3 IMPLANT
COVER SURGICAL LIGHT HANDLE (MISCELLANEOUS) ×3 IMPLANT
DERMABOND ADVANCED (GAUZE/BANDAGES/DRESSINGS) ×1
DERMABOND ADVANCED .7 DNX12 (GAUZE/BANDAGES/DRESSINGS) ×2 IMPLANT
DEVICE SECURE STRAP 25 ABSORB (INSTRUMENTS) ×1 IMPLANT
DEVICE TROCAR PUNCTURE CLOSURE (ENDOMECHANICALS) ×3 IMPLANT
DISSECTOR BLUNT TIP ENDO 5MM (MISCELLANEOUS) IMPLANT
ELECT L-HOOK LAP 45CM DISP (ELECTROSURGICAL) ×2
ELECT PENCIL ROCKER SW 15FT (MISCELLANEOUS) ×1 IMPLANT
ELECT REM PT RETURN 15FT ADLT (MISCELLANEOUS) ×3 IMPLANT
ELECTRODE L-HOOK LAP 45CM DISP (ELECTROSURGICAL) IMPLANT
GLOVE SURG LX 8.0 MICRO (GLOVE) ×1
GLOVE SURG LX STRL 8.0 MICRO (GLOVE) ×2 IMPLANT
GOWN STRL REUS W/ TWL XL LVL3 (GOWN DISPOSABLE) ×6 IMPLANT
GOWN STRL REUS W/TWL XL LVL3 (GOWN DISPOSABLE) ×6
IRRIG SUCT STRYKERFLOW 2 WTIP (MISCELLANEOUS) ×2
IRRIGATION SUCT STRKRFLW 2 WTP (MISCELLANEOUS) IMPLANT
KIT BASIN OR (CUSTOM PROCEDURE TRAY) ×3 IMPLANT
KIT TURNOVER KIT A (KITS) IMPLANT
MARKER SKIN DUAL TIP RULER LAB (MISCELLANEOUS) ×3 IMPLANT
MESH VENTRALEX ST 1-7/10 CRC S (Mesh General) ×1 IMPLANT
NDL SPNL 22GX3.5 QUINCKE BK (NEEDLE) ×2 IMPLANT
NEEDLE SPNL 22GX3.5 QUINCKE BK (NEEDLE) ×2 IMPLANT
SCISSORS LAP 5X45 EPIX DISP (ENDOMECHANICALS) ×3 IMPLANT
SCRUB TECHNI CARE 4 OZ NO DYE (MISCELLANEOUS) ×3 IMPLANT
SET TUBE SMOKE EVAC HIGH FLOW (TUBING) ×3 IMPLANT
SHEARS HARMONIC ACE PLUS 45CM (MISCELLANEOUS) IMPLANT
SLEEVE ADV FIXATION 5X100MM (TROCAR) IMPLANT
SLEEVE XCEL OPT CAN 5 100 (ENDOMECHANICALS) IMPLANT
SPIKE FLUID TRANSFER (MISCELLANEOUS) ×3 IMPLANT
STAPLER VISISTAT 35W (STAPLE) IMPLANT
SUT MNCRL AB 4-0 PS2 18 (SUTURE) ×3 IMPLANT
SUT NOVA 0 T19/GS 22DT (SUTURE) ×1 IMPLANT
SUT NOVA NAB DX-16 0-1 5-0 T12 (SUTURE) ×3 IMPLANT
SUT VIC AB 2-0 SH 27 (SUTURE) ×2
SUT VIC AB 2-0 SH 27X BRD (SUTURE) IMPLANT
SYR 10ML ECCENTRIC (SYRINGE) IMPLANT
TACKER 5MM HERNIA 3.5CML NAB (ENDOMECHANICALS) IMPLANT
TOWEL OR 17X26 10 PK STRL BLUE (TOWEL DISPOSABLE) ×3 IMPLANT
TOWEL OR NON WOVEN STRL DISP B (DISPOSABLE) ×3 IMPLANT
TRAY FOLEY MTR SLVR 16FR STAT (SET/KITS/TRAYS/PACK) IMPLANT
TRAY LAPAROSCOPIC (CUSTOM PROCEDURE TRAY) ×3 IMPLANT
TROCAR ADV FIXATION 11X100MM (TROCAR) IMPLANT
TROCAR ADV FIXATION 5X100MM (TROCAR) IMPLANT
TROCAR BLADELESS OPT 5 100 (ENDOMECHANICALS) ×3 IMPLANT
TROCAR XCEL NON-BLD 11X100MML (ENDOMECHANICALS) IMPLANT
YANKAUER SUCT BULB TIP NO VENT (SUCTIONS) ×3 IMPLANT

## 2021-12-07 NOTE — Transfer of Care (Signed)
Immediate Anesthesia Transfer of Care Note ? ?Patient: Kari Gibbs ? ?Procedure(s) Performed: LAPAROSCOPIC VENTRAL HERNIA REPAIR ? ?Patient Location: PACU ? ?Anesthesia Type:General ? ?Level of Consciousness: awake, alert  and oriented ? ?Airway & Oxygen Therapy: Patient Spontanous Breathing, Patient connected to face mask oxygen and Patient connected to T-piece oxygen ? ?Post-op Assessment: Report given to RN and Post -op Vital signs reviewed and stable ? ?Post vital signs: Reviewed and stable ? ?Last Vitals:  ?Vitals Value Taken Time  ?BP 154/76 12/07/21 0930  ?Temp    ?Pulse 79 12/07/21 0930  ?Resp 15 12/07/21 0930  ?SpO2 100 % 12/07/21 0930  ?Vitals shown include unvalidated device data. ? ?Last Pain:  ?Vitals:  ? 12/07/21 0553  ?TempSrc:   ?PainSc: 0-No pain  ?   ? ?Patients Stated Pain Goal: 3 (12/07/21 0553) ? ?Complications: No notable events documented. ?

## 2021-12-07 NOTE — Anesthesia Postprocedure Evaluation (Signed)
Anesthesia Post Note ? ?Patient: Kari Gibbs ? ?Procedure(s) Performed: LAPAROSCOPIC VENTRAL HERNIA REPAIR ? ?  ? ?Patient location during evaluation: PACU ?Anesthesia Type: General ?Level of consciousness: sedated and patient cooperative ?Pain management: pain level controlled ?Vital Signs Assessment: post-procedure vital signs reviewed and stable ?Respiratory status: spontaneous breathing ?Cardiovascular status: stable ?Anesthetic complications: no ? ? ?No notable events documented. ? ?Last Vitals:  ?Vitals:  ? 12/07/21 1013 12/07/21 1015  ?BP: (!) 146/95 (!) 157/66  ?Pulse:  73  ?Resp: 20   ?Temp: (!) 36.4 ?C   ?SpO2: 93% 97%  ?  ?Last Pain:  ?Vitals:  ? 12/07/21 1013  ?TempSrc: Oral  ?PainSc: 0-No pain  ? ? ?  ?  ?  ?  ?  ?  ? ?Nolon Nations ? ? ? ? ?

## 2021-12-07 NOTE — Anesthesia Procedure Notes (Signed)
Procedure Name: Intubation ?Date/Time: 12/07/2021 7:40 AM ?Performed by: [place] Timmie Dugue C, CRNA ?Pre-anesthesia Checklist: Patient identified, Emergency Drugs available, Suction available and Patient being monitored ?Patient Re-evaluated:Patient Re-evaluated prior to induction ?Oxygen Delivery Method: Circle system utilized ?Preoxygenation: Pre-oxygenation with 100% oxygen ?Induction Type: IV induction ?Ventilation: Mask ventilation without difficulty ?Laryngoscope Size: Mac and 3 ?Grade View: Grade I ?Tube type: Oral ?Tube size: 7.0 mm ?Number of attempts: 1 ?Airway Equipment and Method: Stylet and Oral airway ?Placement Confirmation: ETT inserted through vocal cords under direct vision, positive ETCO2 and breath sounds checked- equal and bilateral ?Secured at: 21 cm ?Tube secured with: Tape ?Dental Injury: Teeth and Oropharynx as per pre-operative assessment  ? ? ? ? ?

## 2021-12-07 NOTE — Op Note (Signed)
Cambreigh Dearing  Aug 17, 1950 ? ? ?12/07/2021 ? ? ? ?PCP:  Cari Caraway, MD ? ? ?Surgeon: Kaylyn Lim, MD, FACS ? ?Asst:  Cameron Sprang, MD ? ?Anes:  general ? ?Preop Dx: Mass in upper abdomen ?Postop Dx: Trocar site hernia involving the falciform ? ?Procedure: Lap assisted ventral hernia with Ventralex mesh 1.7 " ?Location Surgery: [facility] ?Complications:  None noted ? ?EBL:   minimal cc ? ?Drains: none ? ?Description of Procedure: ? The patient was taken to OR 1 .  After anesthesia was administered and the patient was prepped  with chloroprep  and a timeout was performed.  Access to the abdomen was gained with an Optiview through the left upper quadrant without difficulty.  The mass had been marked and was in the midline at the site of a prior trocar.  A fascial hole was seen going in to a hernia.  The trocar incision was opened and the hernia sac dissected from the surrounding tissue.  The sac was amputated and removed.  A Ventralex mesh patch 1.7 " was inserted into the small (~ 1 cm) fascial defect and secured with 4 sutures of 0 Novafil.  The abdomen was reinflated and a loop of bowel was tacked up to the mesh and this was corrected by replacing one of the sutures.  The secure strap absorbable tacker was used through a second port to tack the mesh from within.  The falciform had been a sliding part of this hernia and there was a raw spot that was cauterized.  The small bowel was reexamined and was OK.  The repair was irrigated and Exparel injected into the incisions.  The wounds were closed with 4-0 Monocryl and Dermabond.  An abdominal binder was applied.   ? ?The patient tolerated the procedure well and was taken to the PACU in stable condition.   ? ? ?Matt B. Hassell Done, MD, FACS ?[facility], [place] ?[phone]  ?

## 2021-12-07 NOTE — Interval H&P Note (Signed)
History and Physical Interval Note: ? ?12/07/2021 ?7:07 AM ? ?Kari Gibbs  has presented today for surgery, with the diagnosis of upper midline ventral hernia.  The various methods of treatment have been discussed with the patient and family. After consideration of risks, benefits and other options for treatment, the patient has consented to  Procedure(s): ?LAPAROSCOPIC VENTRAL HERNIA REPAIR (N/A) as a surgical intervention.  The patient's history has been reviewed, patient examined, no change in status, stable for surgery.  I have reviewed the patient's chart and labs.  Questions were answered to the patient's satisfaction.   ? ? ?Pedro Earls ? ? ?

## 2021-12-10 ENCOUNTER — Encounter (HOSPITAL_COMMUNITY): Payer: Self-pay | Admitting: Surgery

## 2021-12-28 DIAGNOSIS — E538 Deficiency of other specified B group vitamins: Secondary | ICD-10-CM | POA: Insufficient documentation

## 2021-12-28 DIAGNOSIS — M810 Age-related osteoporosis without current pathological fracture: Secondary | ICD-10-CM | POA: Insufficient documentation

## 2022-01-03 DIAGNOSIS — M25562 Pain in left knee: Secondary | ICD-10-CM | POA: Diagnosis not present

## 2022-01-03 DIAGNOSIS — M1712 Unilateral primary osteoarthritis, left knee: Secondary | ICD-10-CM | POA: Diagnosis not present

## 2022-01-10 DIAGNOSIS — M1712 Unilateral primary osteoarthritis, left knee: Secondary | ICD-10-CM | POA: Diagnosis not present

## 2022-01-10 DIAGNOSIS — M25562 Pain in left knee: Secondary | ICD-10-CM | POA: Diagnosis not present

## 2022-01-17 DIAGNOSIS — M1712 Unilateral primary osteoarthritis, left knee: Secondary | ICD-10-CM | POA: Diagnosis not present

## 2022-01-20 DIAGNOSIS — F1123 Opioid dependence with withdrawal: Secondary | ICD-10-CM | POA: Insufficient documentation

## 2022-01-20 DIAGNOSIS — Z6834 Body mass index (BMI) 34.0-34.9, adult: Secondary | ICD-10-CM | POA: Insufficient documentation

## 2022-01-20 NOTE — Progress Notes (Unsigned)
PROVIDER NOTE: Information contained herein reflects review and annotations entered in association with encounter. Interpretation of such information and data should be left to medically-trained personnel. Information provided to patient can be located elsewhere in the medical record under "Patient Instructions". Document created using STT-dictation technology, any transcriptional errors that may result from process are unintentional.    Patient: Kari Gibbs  Service Category: E/M  Provider: Gaspar Cola, MD  DOB: 1951/02/22  DOS: 01/23/2022  Specialty: Interventional Pain Management  MRN: [id number]  Setting: Ambulatory outpatient  PCP: Kari Caraway, MD  Type: Established Patient    Referring Provider: Cari Caraway, MD  Location: Office  Delivery: Face-to-face     HPI  [name], a 71 y.o. year old female, is here today because of her No primary diagnosis found.. Kari Gibbs primary complain today is No chief complaint on file. Last encounter: My last encounter with her was on 10/17/2021. Pertinent problems: Kari Gibbs has Fracture of rib (4th and 8th rib) (Left); Chronic pain syndrome; Chronic flank pain (1ry area of Pain) (Left); Chronic thoracic radicular pain (Left-sided); Chronic occipital neuralgia (Left); Neuropathic pain; Neurogenic pain; History of rib fracture (4th and 8th rib) (Left); Vertebral body hemangioma (T11); Metatarsalgia of foot (Right); Chronic abdominal pain (2ry area of Pain) (Bilateral) (L>R); Primary osteoarthritis; Chronic neck pain (4th area of Pain) (Bilateral) (L>R); Cervical facet syndrome (Bilateral) (L>R); Muscle spasticity; Tremors of nervous system; Muscle twitching; Cervical spondylosis; Chronic low back pain (3ry area of Pain) (Bilateral) w/o sciatica; Acute costochondritis (Left); Chronic low back pain (Bilateral) w/ sciatica (Bilateral); Chronic sacroiliac joint pain (Bilateral); Other specified dorsopathies, sacral and sacrococcygeal  region; Chronic sacroiliac joint somatic dysfunction (Bilateral); Chronic postoperative pain; Epigastric pain; Left upper quadrant pain; Generalized pain; Headache; and Rib pain on left side on their pertinent problem list. Pain Assessment: Severity of   is reported as a  /10. Location:    / . Onset:  . Quality:  . Timing:  . Modifying factor(s):  Marland Kitchen Vitals:  vitals were not taken for this visit.   Reason for encounter:  *** . ***  Pharmacotherapy Assessment  Analgesic: MS IR 15 mg mg, 1 tab PO q 8 hrs (45 mg/day of morphine) MME/day: 45 mg/day.   Monitoring: Kari Gibbs: PDMP reviewed during this encounter.       Pharmacotherapy: No side-effects or adverse reactions reported. Compliance: No problems identified. Effectiveness: Clinically acceptable.  No notes on file  UDS:  Summary  Date Value Ref Range Status  03/07/2021 Note  Final    Comment:    ==================================================================== [employer] Select 13 (MW) ==================================================================== Test                             Result       Flag       Units  Drug Present and Declared for Prescription Verification   Lorazepam                      1518         EXPECTED   ng/mg creat    Source of lorazepam is a scheduled prescription medication.    Morphine                       >9259        EXPECTED   ng/mg creat   Normorphine  533          EXPECTED   ng/mg creat    Potential sources of large amounts of morphine in the absence of    codeine include administration of morphine or use of heroin.     Normorphine is an expected metabolite of morphine.    Hydromorphone                  126          EXPECTED   ng/mg creat    Hydromorphone may be present as a metabolite of morphine;    concentrations of hydromorphone rarely exceed 5% of the morphine    concentration when this is the source of hydromorphone.  Drug Absent but Declared for Prescription  Verification   Oxycodone                      Not Detected UNEXPECTED ng/mg creat ==================================================================== Test                      Result    Flag   Units      Ref Range   Creatinine              108              mg/dL      >=20 ==================================================================== Declared Medications:  The flagging and interpretation on this report are based on the  following declared medications.  Unexpected results may arise from  inaccuracies in the declared medications.   **Note: The testing scope of this panel includes these medications:   Lorazepam (Ativan)  Morphine (MSIR)  Oxycodone (Roxicodone)   **Note: The testing scope of this panel does not include the  following reported medications:   Acetaminophen (Tylenol)  Azelastine (Astelin)  Benzonatate (Tessalon)  Biotin  Buspirone (Buspar)  Cranberry  Dextromethorphan  Diclofenac (Voltaren)  Esomeprazole (Nexium)  Estrogens (Premarin)  Folic Acid  Gabapentin (Neurontin)  Ibuprofen (Advil)  Irbesartan (Avapro)  Iron  Loratadine (Claritin)  Magnesium  Montelukast (Singulair)  Multivitamin  Naloxone (Narcan)  Potassium  Promethazine  Supplement  Vitamin C  Vitamin D3 ==================================================================== For clinical consultation, please call [phone]. ====================================================================      ROS  Constitutional: Denies any fever or chills Gastrointestinal: No reported hemesis, hematochezia, vomiting, or acute GI distress Musculoskeletal: Denies any acute onset joint swelling, redness, loss of ROM, or weakness Neurological: No reported episodes of acute onset apraxia, aphasia, dysarthria, agnosia, amnesia, paralysis, loss of coordination, or loss of consciousness  Medication Review  Biotin, Cranberry, FLUoxetine, Flutter, HYDROcodone-acetaminophen, Iron-Vitamin C, LORazepam,  Magnesium, Peppermint Oil, Polyethyl Glycol-Propyl Glycol, Prenatal MV & Min w/FA-DHA, RA Probiotic Gummies, Vitamin D3, acetaminophen, buPROPion, busPIRone, diclofenac Sodium, doxepin, estradiol, irbesartan, loratadine, montelukast, morphine, naloxone, potassium gluconate, and vitamin B-12  History Review  Allergy: Ms. Baugh is allergic to iohexol, ace inhibitors, losartan potassium, and neosporin [neomycin-bacitracin zn-polymyx]. Drug: Ms. Rhinehart  reports no history of drug use. Alcohol:  reports current alcohol use. Tobacco:  reports that she has never smoked. She has never used smokeless tobacco. Social: Ms. Yeske  reports that she has never smoked. She has never used smokeless tobacco. She reports current alcohol use. She reports that she does not use drugs. 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 Surgical: Ms. Cullens  has a past surgical history that includes Gastric bypass (11/13/2009); Exploratory laparotomy (2012); TMJ- LEFT--ABOUT 10 YRS AGO--LEFT SIDE--PT NOW HAS SOME PAIN LEFT JAW (Left); laparoscopy (03/04/2012); Hernia repair (2011); Abdominal hysterectomy (1988); Cholecystectomy (N/A, 05/18/2013); Shoulder surgery (Right, 06/16/2017); Endovenous ablation saphenous vein w/ laser (Left, 02/04/2018); Esophageal manometry (N/A, 04/05/2019); Video bronchoscopy (Bilateral, 07/14/2019); Open reduction internal fixation (orif) distal radial fracture (Right, 04/10/2020); Knee arthroscopy (Bilateral); and Ventral hernia repair (N/A,  12/07/2021). Family: family history includes Alzheimer's disease in her father; Hypertension in her mother.  Laboratory Chemistry Profile   Renal Lab Results  Component Value Date   BUN 19 11/15/2021   CREATININE 0.90 11/15/2021   GFRAA >60 01/04/2016   GFRNONAA >60 11/15/2021    Hepatic Lab Results  Component Value Date   AST 29 04/04/2021   ALT 30 04/04/2021   ALBUMIN 4.6 04/04/2021   ALKPHOS 89 04/04/2021   AMYLASE 30 02/27/2010   LIPASE 22 10/12/2012    Electrolytes Lab Results  Component Value Date   NA 140 11/15/2021   K 5.2 (H) 11/15/2021   CL 105 11/15/2021   CALCIUM 9.4 11/15/2021   MG 2.1 01/04/2016    Bone Lab Results  Component Value Date   25OHVITD1 49 01/04/2016   25OHVITD2 25 01/04/2016   25OHVITD3 24 01/04/2016    Inflammation (CRP: Acute Phase) (ESR: Chronic Phase) Lab Results  Component Value Date   CRP <0.5 01/04/2016   ESRSEDRATE 17 01/04/2016         Note: Above Lab results reviewed.  Recent Imaging Review  CT HEAD WO CONTRAST (5MM) CLINICAL DATA:  Headaches and hypertension  EXAM: CT HEAD WITHOUT CONTRAST  TECHNIQUE: Contiguous axial images were obtained from the base of the skull through the vertex without intravenous contrast.  COMPARISON:  07/01/2017  FINDINGS: Brain: No evidence of acute infarction, hemorrhage, hydrocephalus, extra-axial collection or mass lesion/mass effect.  Vascular: No hyperdense vessel or unexpected calcification.  Skull: Normal. Negative for fracture or focal lesion.  Sinuses/Orbits: No acute finding.  Other: None.  IMPRESSION: No acute intracranial abnormality.  Electronically Signed   By: Inez Catalina M.D.   On: 04/04/2021 19:32 Note: Reviewed        Physical Exam  General appearance: Well nourished, well developed, and well hydrated. In no apparent acute distress Mental status: Alert, oriented x 3 (person, place, & time)       Respiratory: No evidence of acute respiratory  distress Eyes: PERLA Vitals: There were no vitals taken for this visit. BMI: Estimated body mass index is 33.27 kg/m as calculated from the following:   Height as of 12/07/21: 5' 5.5" (1.664 m).   Weight as of 12/07/21: 203 lb (92.1 kg). Ideal: Patient weight not recorded  Assessment   Diagnosis Status  No diagnosis found. Controlled Controlled Controlled   Updated Problems: Problem  Cervical Spondylosis  Long Term (Current) Use of Opiate Analgesic  Opioid Dependence, Uncomplicated (Hcc)   Formatting of this note might be different from the original. Last Assessment & Plan:  Formatting of this note might be different from the original. Continue follow-up with pain management   Family History of Malignant Neoplasm of Digestive Organs  Chronic Cough   Onset ? Around 2009 prev eval by Dr Joya Gaskins better off ACEi  - Allergy eval done early [place] for rhinitis and recurrent sinusitis pos only dust > avoidance. -Spirometry 06/03/18 nl  - HRCT chest 06/22/18 1. Negative for interstitial lung disease. 2. Minimal air trapping, indicative of small airways disease - Dg UGI 04/26/19 1. Esophageal dysmotility. 2. 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 Fob 07/14/19 Laryngeal edema.  - Hyperplastic changes were seen in the larynx.   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 Formatting of this note might be different from the original. Onset ? Around 2009 prev eval by Dr Joya Gaskins better off ACEi  - Allergy eval done early [place] for rhinitis and recurrent  sinusitis pos only dust > avoidance. -Spirometry 06/03/18 nl  - HRCT chest 06/22/18 1. Negative for interstitial lung disease. 2. Minimal air trapping, indicative of small airways disease - Dg UGI 04/26/19 1. Esophageal dysmotility. 2. 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 Fob 07/14/19 Laryngeal edema.  - Hyperplastic changes were seen in the larynx.   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 Onset ? Around 2009 prev eval by Dr Mamie [place]/ better off ACEi  - Allergy eval done early [place] for rhinitis and recurrent sinusitis pos only dust > avoidance. -Spirometry 06/03/18 nl  - HRCT chest 06/22/18 1. Negative for interstitial lung disease. 2. Minimal air trapping, indicative of small airways disease - Dg UGI 04/26/19 1. Esophageal dysmotility. 2. 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 Fob 07/14/19 Laryngeal edema.  - Hyperplastic changes were seen in the larynx.   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 > did not take any meds for it "I don't have any nasal symptoms"   - 07/07/2020 rec titrate gabapentin to max of 1200 mg per day/ added 1st gen H1 blockers per guidelines  And referred to Dr Joya Gaskins at [facility] voice center  Comment: Upper airway cough syndrome (previously labeled PNDS),  is so named because it's frequently impossible to sort out how much is  CR/sinusitis with freq throat clearing (which can be related to  primary GERD)   vs  causing  secondary (" extra esophageal")  GERD from wide swings in gastric pressure that occur with throat clearing, often  promoting self use of mint and menthol lozenges that reduce the lower esophageal sphincter tone and exacerbate the problem further in a cyclical fashion.   These are the same pts (now being labeled as having "irritable larynx syndrome" by  some cough centers) who not infrequently have a history of having failed to tolerate ace inhibitors,  dry powder inhalers or biphosphonates or report having atypical/extraesophageal reflux symptoms that don't respond to standard doses of PPI  and are easily confused as having aecopd or asthma flares by even experienced allergists/ pulmonologists (myself included).   >>> for now try up to 1200 mg gabapentin per day by titrating up to that level and preventing cns side effects from the peaks of the 400 mg dose  (give 400 first thing in am and hs but 100 mg 1-2 at lunch and supper as tol  >>> 1st gen H1 blockers per guidelines  Try doses at hs only for now and use daytime prn if tol cns effects   F/u is per Dr Joya Gaskins at [facility] and we can see her here again prn   Discussed in detail all the  indications, usual  risks and alternatives  relative to the benefits with patient who agrees to proceed with Rx as outlined.   Each maintenance medication was reviewed in detail including emphasizing most importantly the difference between maintenance and prns and under what circumstances the prns are to be triggered using an action plan format where appropriate.  Total time for H and P, chart review, counseling,  and generating customized AVS unique to this office visit / charting =39 min   Htn (Hypertension)  Body Mass Index (Bmi) 34.0-34.9, Adult  Opioid Dependence With Withdrawal (Hcc)  Osteoporosis  Vitamin B12 Deficiency (Non Anemic)  Allergy Status to Unspecified Drugs, Medicaments and Biological Substances  Opioid Use, Unspecified  With Withdrawal (Hcc)  Varicose Veins of Lower Extremity With Other Complication  Opioid Use With Withdrawal (Hcc) (Resolved)  Opioid Withdrawal (Hcc) (Resolved)  Obesity (Resolved)    Plan of Care  Problem-specific:  No problem-specific Assessment & Plan notes found for this encounter.  Ms. Aoi Kouns has a current medication list which includes the following long-term medication(s): bupropion, diclofenac sodium, doxepin, fluoxetine, morphine, morphine, and morphine.  Pharmacotherapy (Medications Ordered): No orders of the defined types were placed in this encounter.  Orders:  No orders of the defined types were placed in this encounter.  Follow-up plan:   No follow-ups on file.     Interventional management options:  Considering:   Diagnostic left costochondral joint injection  Possible left intercostal nerve RFA  Therapeutic Left greater occipital nerve RFA  Diagnostic left C2 + TON nerve block  Possible left C2 + TON RFA    Palliative PRN treatment(s):   Palliative left intercostal nerve blocks (T5 to T8) #2  Palliative left GONB block #3  Palliative bilateral sacroiliac joint block #2       Recent Visits No visits were found meeting these conditions. Showing recent visits within past 90 days and meeting all other requirements Future Appointments Date Type Provider Dept  01/23/22 Appointment [name], MD [facility]  Showing future appointments within next 90 days and meeting all other requirements  I discussed the assessment and treatment plan with the patient. The patient was provided an opportunity to ask questions and all were answered. The patient agreed with the plan and demonstrated an understanding of the instructions.  Patient advised to call back or seek an in-person evaluation if the symptoms or condition worsens.  Duration of encounter: *** minutes.  Note by: Kari Cola, MD Date: 01/23/2022; Time: 8:45 AM

## 2022-01-22 DIAGNOSIS — I10 Essential (primary) hypertension: Secondary | ICD-10-CM | POA: Diagnosis not present

## 2022-01-22 DIAGNOSIS — M810 Age-related osteoporosis without current pathological fracture: Secondary | ICD-10-CM | POA: Diagnosis not present

## 2022-01-22 DIAGNOSIS — K219 Gastro-esophageal reflux disease without esophagitis: Secondary | ICD-10-CM | POA: Diagnosis not present

## 2022-01-22 DIAGNOSIS — G8929 Other chronic pain: Secondary | ICD-10-CM | POA: Diagnosis not present

## 2022-01-22 DIAGNOSIS — G4700 Insomnia, unspecified: Secondary | ICD-10-CM | POA: Diagnosis not present

## 2022-01-23 ENCOUNTER — Encounter: Payer: Self-pay | Admitting: Pain Medicine

## 2022-01-23 ENCOUNTER — Ambulatory Visit: Payer: Medicare Other | Attending: Pain Medicine | Admitting: Pain Medicine

## 2022-01-23 VITALS — BP 125/66 | HR 79 | Temp 98.1°F | Resp 18 | Ht 65.0 in | Wt 195.0 lb

## 2022-01-23 DIAGNOSIS — Z79891 Long term (current) use of opiate analgesic: Secondary | ICD-10-CM | POA: Insufficient documentation

## 2022-01-23 DIAGNOSIS — R1011 Right upper quadrant pain: Secondary | ICD-10-CM | POA: Insufficient documentation

## 2022-01-23 DIAGNOSIS — G8929 Other chronic pain: Secondary | ICD-10-CM | POA: Diagnosis not present

## 2022-01-23 DIAGNOSIS — R109 Unspecified abdominal pain: Secondary | ICD-10-CM | POA: Diagnosis not present

## 2022-01-23 DIAGNOSIS — Z79899 Other long term (current) drug therapy: Secondary | ICD-10-CM | POA: Diagnosis not present

## 2022-01-23 DIAGNOSIS — G894 Chronic pain syndrome: Secondary | ICD-10-CM | POA: Insufficient documentation

## 2022-01-23 DIAGNOSIS — M542 Cervicalgia: Secondary | ICD-10-CM | POA: Insufficient documentation

## 2022-01-23 DIAGNOSIS — R1012 Left upper quadrant pain: Secondary | ICD-10-CM | POA: Diagnosis not present

## 2022-01-23 DIAGNOSIS — M545 Low back pain, unspecified: Secondary | ICD-10-CM | POA: Diagnosis not present

## 2022-01-23 MED ORDER — MORPHINE SULFATE 15 MG PO TABS: 15.0000 mg | ORAL_TABLET | Freq: Three times a day (TID) | ORAL | 0 refills | Status: DC | PRN

## 2022-01-23 NOTE — Patient Instructions (Signed)

## 2022-01-23 NOTE — Progress Notes (Signed)
Nursing Pain Medication Assessment:  Safety precautions to be maintained throughout the outpatient stay will include: orient to surroundings, keep bed in low position, maintain call bell within reach at all times, provide assistance with transfer out of bed and ambulation.  Medication Inspection Compliance: Pill count conducted under aseptic conditions, in front of the patient. Neither the pills nor the bottle was removed from the patient's sight at any time. Once count was completed pills were immediately returned to the patient in their original bottle.  Medication: Morphine IR Pill/Patch Count:  29 of 90 pills remain Pill/Patch Appearance: Markings consistent with prescribed medication Bottle Appearance: Standard pharmacy container. Clearly labeled. Filled Date: 05 / 24 / 2023 Last Medication intake:  Today

## 2022-01-29 LAB — TOXASSURE SELECT 13 (MW), URINE

## 2022-02-18 ENCOUNTER — Other Ambulatory Visit: Payer: Self-pay | Admitting: Gastroenterology

## 2022-02-18 ENCOUNTER — Telehealth: Payer: Self-pay | Admitting: Pain Medicine

## 2022-02-18 DIAGNOSIS — R1084 Generalized abdominal pain: Secondary | ICD-10-CM | POA: Diagnosis not present

## 2022-02-18 DIAGNOSIS — R112 Nausea with vomiting, unspecified: Secondary | ICD-10-CM | POA: Diagnosis not present

## 2022-02-18 NOTE — Telephone Encounter (Signed)
Informed patient that many patients do drive while taking opioids, however, if there were to be an accident, she may possibly held liable because the opioids would still be in her body.

## 2022-02-18 NOTE — Telephone Encounter (Signed)
Patient wants to know if she can drive now since her meds was changed to an lower dosage. Please give patient a call. Thanks

## 2022-02-21 DIAGNOSIS — M25562 Pain in left knee: Secondary | ICD-10-CM | POA: Diagnosis not present

## 2022-02-21 DIAGNOSIS — M1711 Unilateral primary osteoarthritis, right knee: Secondary | ICD-10-CM | POA: Diagnosis not present

## 2022-02-21 DIAGNOSIS — M1712 Unilateral primary osteoarthritis, left knee: Secondary | ICD-10-CM | POA: Diagnosis not present

## 2022-02-25 DIAGNOSIS — R7303 Prediabetes: Secondary | ICD-10-CM | POA: Diagnosis not present

## 2022-02-25 DIAGNOSIS — E673 Hypervitaminosis D: Secondary | ICD-10-CM | POA: Diagnosis not present

## 2022-02-25 DIAGNOSIS — E538 Deficiency of other specified B group vitamins: Secondary | ICD-10-CM | POA: Diagnosis not present

## 2022-03-01 DIAGNOSIS — R110 Nausea: Secondary | ICD-10-CM | POA: Diagnosis not present

## 2022-03-04 DIAGNOSIS — G479 Sleep disorder, unspecified: Secondary | ICD-10-CM | POA: Diagnosis not present

## 2022-03-04 DIAGNOSIS — K911 Postgastric surgery syndromes: Secondary | ICD-10-CM | POA: Diagnosis not present

## 2022-03-04 DIAGNOSIS — R110 Nausea: Secondary | ICD-10-CM | POA: Diagnosis not present

## 2022-03-04 DIAGNOSIS — M542 Cervicalgia: Secondary | ICD-10-CM | POA: Diagnosis not present

## 2022-03-04 DIAGNOSIS — R269 Unspecified abnormalities of gait and mobility: Secondary | ICD-10-CM | POA: Diagnosis not present

## 2022-03-04 DIAGNOSIS — M810 Age-related osteoporosis without current pathological fracture: Secondary | ICD-10-CM | POA: Diagnosis not present

## 2022-03-04 DIAGNOSIS — G8928 Other chronic postprocedural pain: Secondary | ICD-10-CM | POA: Diagnosis not present

## 2022-03-04 DIAGNOSIS — R7303 Prediabetes: Secondary | ICD-10-CM | POA: Diagnosis not present

## 2022-03-04 DIAGNOSIS — K219 Gastro-esophageal reflux disease without esophagitis: Secondary | ICD-10-CM | POA: Diagnosis not present

## 2022-03-04 DIAGNOSIS — I10 Essential (primary) hypertension: Secondary | ICD-10-CM | POA: Diagnosis not present

## 2022-03-06 ENCOUNTER — Ambulatory Visit
Admission: RE | Admit: 2022-03-06 | Discharge: 2022-03-06 | Disposition: A | Discharge status: Home / Self Care | Payer: Medicare Other | Source: Ambulatory Visit | Attending: Gastroenterology | Admitting: Gastroenterology

## 2022-03-06 ENCOUNTER — Other Ambulatory Visit: Payer: Self-pay | Admitting: Gastroenterology

## 2022-03-06 DIAGNOSIS — K219 Gastro-esophageal reflux disease without esophagitis: Secondary | ICD-10-CM | POA: Diagnosis not present

## 2022-03-06 DIAGNOSIS — R112 Nausea with vomiting, unspecified: Secondary | ICD-10-CM

## 2022-03-06 DIAGNOSIS — K449 Diaphragmatic hernia without obstruction or gangrene: Secondary | ICD-10-CM | POA: Diagnosis not present

## 2022-03-12 DIAGNOSIS — R262 Difficulty in walking, not elsewhere classified: Secondary | ICD-10-CM | POA: Diagnosis not present

## 2022-03-12 DIAGNOSIS — M542 Cervicalgia: Secondary | ICD-10-CM | POA: Diagnosis not present

## 2022-03-21 DIAGNOSIS — R262 Difficulty in walking, not elsewhere classified: Secondary | ICD-10-CM | POA: Diagnosis not present

## 2022-03-21 DIAGNOSIS — M542 Cervicalgia: Secondary | ICD-10-CM | POA: Diagnosis not present

## 2022-03-26 DIAGNOSIS — M170 Bilateral primary osteoarthritis of knee: Secondary | ICD-10-CM | POA: Diagnosis not present

## 2022-04-04 DIAGNOSIS — R262 Difficulty in walking, not elsewhere classified: Secondary | ICD-10-CM | POA: Diagnosis not present

## 2022-04-04 DIAGNOSIS — Z0000 Encounter for general adult medical examination without abnormal findings: Secondary | ICD-10-CM | POA: Diagnosis not present

## 2022-04-04 DIAGNOSIS — Z23 Encounter for immunization: Secondary | ICD-10-CM | POA: Diagnosis not present

## 2022-04-04 DIAGNOSIS — M542 Cervicalgia: Secondary | ICD-10-CM | POA: Diagnosis not present

## 2022-04-09 DIAGNOSIS — M542 Cervicalgia: Secondary | ICD-10-CM | POA: Diagnosis not present

## 2022-04-09 DIAGNOSIS — R262 Difficulty in walking, not elsewhere classified: Secondary | ICD-10-CM | POA: Diagnosis not present

## 2022-04-17 DIAGNOSIS — H04123 Dry eye syndrome of bilateral lacrimal glands: Secondary | ICD-10-CM | POA: Diagnosis not present

## 2022-04-17 DIAGNOSIS — H52223 Regular astigmatism, bilateral: Secondary | ICD-10-CM | POA: Diagnosis not present

## 2022-04-17 DIAGNOSIS — H532 Diplopia: Secondary | ICD-10-CM | POA: Diagnosis not present

## 2022-04-17 DIAGNOSIS — H0102A Squamous blepharitis right eye, upper and lower eyelids: Secondary | ICD-10-CM | POA: Diagnosis not present

## 2022-04-17 DIAGNOSIS — H2513 Age-related nuclear cataract, bilateral: Secondary | ICD-10-CM | POA: Diagnosis not present

## 2022-04-23 DIAGNOSIS — R262 Difficulty in walking, not elsewhere classified: Secondary | ICD-10-CM | POA: Diagnosis not present

## 2022-04-23 DIAGNOSIS — M542 Cervicalgia: Secondary | ICD-10-CM | POA: Diagnosis not present

## 2022-04-23 NOTE — Progress Notes (Unsigned)
PROVIDER NOTE: Information contained herein reflects review and annotations entered in association with encounter. Interpretation of such information and data should be left to medically-trained personnel. Information provided to patient can be located elsewhere in the medical record under "Patient Instructions". Document created using STT-dictation technology, any transcriptional errors that may result from process are unintentional.    Patient: Kari Gibbs  Service Category: E/M  Provider: Gaspar Cola, MD  DOB: Jul 30, 1951  DOS: 04/24/2022  Referring Provider: Cari Caraway, MD  MRN: [id number]  Specialty: Interventional Pain Management  PCP: Cari Caraway, MD  Type: Established Patient  Setting: Ambulatory outpatient    Location: Office  Delivery: Face-to-face     HPI  [name], a 71 y.o. year old female, is here today because of her No primary diagnosis found.. Ms. Hinojosa primary complain today is No chief complaint on file. Last encounter: My last encounter with her was on 02/18/2022. Pertinent problems: Ms. Gignac has Fracture of rib (4th and 8th rib) (Left); Chronic pain syndrome; Chronic flank pain (1ry area of Pain) (Left); Chronic thoracic radicular pain (Left-sided); Chronic occipital neuralgia (Left); Neuropathic pain; Neurogenic pain; History of rib fracture (4th and 8th rib) (Left); Vertebral body hemangioma (T11); Metatarsalgia of foot (Right); Chronic abdominal pain (2ry area of Pain) (Bilateral) (L>R); Primary osteoarthritis; Chronic neck pain (4th area of Pain) (Bilateral) (L>R); Cervical facet syndrome (Bilateral) (L>R); Muscle spasticity; Tremors of nervous system; Muscle twitching; Cervical spondylosis; Chronic low back pain (3ry area of Pain) (Bilateral) w/o sciatica; Acute costochondritis (Left); Chronic low back pain (Bilateral) w/ sciatica (Bilateral); Chronic sacroiliac joint pain (Bilateral); Other specified dorsopathies, sacral and sacrococcygeal  region; Chronic sacroiliac joint somatic dysfunction (Bilateral); Chronic postoperative pain; Epigastric pain; Left upper quadrant pain; Generalized pain; Headache; and Rib pain on left side on their pertinent problem list. Pain Assessment: Severity of   is reported as a  /10. Location:    / . Onset:  . Quality:  . Timing:  . Modifying factor(s):  Marland Kitchen Vitals:  vitals were not taken for this visit.   Reason for encounter: medication management. ***  Pharmacotherapy Assessment  Analgesic: MS IR 15 mg mg, 1 tab PO q 8 hrs (45 mg/day of morphine) MME/day: 45 mg/day.   Monitoring: [place] PMP: PDMP reviewed during this encounter.       Pharmacotherapy: No side-effects or adverse reactions reported. Compliance: No problems identified. Effectiveness: Clinically acceptable.  No notes on file  No results found for: "CBDTHCR" No results found for: "D8THCCBX" No results found for: "D9THCCBX"  UDS:  Summary  Date Value Ref Range Status  01/23/2022 Note  Final    Comment:    ==================================================================== [employer] (MW) ==================================================================== Test                             Result       Flag       Units  Drug Present and Declared for Prescription Verification   Lorazepam                      >1290        EXPECTED   ng/mg creat    Source of lorazepam is a scheduled prescription medication.    Morphine                       >6452        EXPECTED   ng/mg  creat   Normorphine                    491          EXPECTED   ng/mg creat    Potential sources of large amounts of morphine in the absence of    codeine include administration of morphine or use of heroin.     Normorphine is an expected metabolite of morphine.    Hydromorphone                  284          EXPECTED   ng/mg creat    Hydromorphone may be administered as a scheduled prescription    medication and is also an expected metabolite of  hydrocodone.    Hydromorphone is also a minor metabolite of morphine which is also    present in this specimen. Concentrations of hydromorphone rarely    exceed 5% of the morphine concentration when metabolism of morphine    is the sole source of hydromorphone.  Drug Absent but Declared for Prescription Verification   Hydrocodone                    Not Detected UNEXPECTED ng/mg creat    Hydrocodone is almost always present in patients taking this drug    consistently. Absence of hydrocodone could be due to lapse of time    since the last dose or unusual pharmacokinetics (rapid metabolism).  ==================================================================== Test                      Result    Flag   Units      Ref Range   Creatinine              155              mg/dL      >=20 ==================================================================== Declared Medications:  The flagging and interpretation on this report are based on the  following declared medications.  Unexpected results may arise from  inaccuracies in the declared medications.   **Note: The testing scope of this panel includes these medications:   Hydrocodone (Norco)  Lorazepam (Ativan)  Morphine (MSIR)   **Note: The testing scope of this panel does not include the  following reported medications:   Acetaminophen (Tylenol)  Acetaminophen (Norco)  Biotin  Bupropion (Wellbutrin XL)  Buspirone (Buspar)  Cholecalciferol  Cranberry  Cyanocobalamin  Diclofenac (Voltaren)  Doxepin (Sinequan)  Estradiol (Estrace)  Eye Drop  Fluoxetine (Prozac)  Folic Acid  Irbesartan (Avapro)  Iron  Loratadine (Claritin)  Magnesium  Montelukast  Multivitamin  Naloxone (Narcan)  Potassium  Probiotic  Supplement  Vitamin C ==================================================================== For clinical consultation, please call [phone]. ====================================================================        ROS  Constitutional: Denies any fever or chills Gastrointestinal: No reported hemesis, hematochezia, vomiting, or acute GI distress Musculoskeletal: Denies any acute onset joint swelling, redness, loss of ROM, or weakness Neurological: No reported episodes of acute onset apraxia, aphasia, dysarthria, agnosia, amnesia, paralysis, loss of coordination, or loss of consciousness  Medication Review  Biotin, Cranberry, FLUoxetine, Flutter, HYDROcodone-acetaminophen, Iron-Vitamin C, LORazepam, Magnesium, Peppermint Oil, Polyethyl Glycol-Propyl Glycol, Prenatal MV & Min w/FA-DHA, RA Probiotic Gummies, Vitamin D3, acetaminophen, buPROPion, busPIRone, diclofenac Sodium, doxepin, estradiol, irbesartan, loratadine, montelukast, morphine, naloxone, potassium gluconate, and vitamin B-12  History Review  Allergy: Ms. Viglione is allergic to iohexol, ace inhibitors, losartan potassium, neosporin [neomycin-bacitracin zn-polymyx], and bacitracin-polymyxin  b. Drug: Ms. Miklos  reports no history of drug use. Alcohol:  reports current alcohol use. Tobacco:  reports that she has never smoked. She has never used smokeless tobacco. Social: Ms. Pates  reports that she has never smoked. She has never used smokeless tobacco. She reports current alcohol use. She reports that she does not use drugs. 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 Surgical: Ms. 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 Family: family history includes Alzheimer's disease in her father; Hypertension in her mother.  Laboratory Chemistry Profile   Renal Lab Results  Component Value Date   BUN 19 11/15/2021   CREATININE 0.90 11/15/2021   GFRAA >60 01/04/2016   GFRNONAA >60 11/15/2021    Hepatic Lab Results  Component Value Date   AST 29 04/04/2021   ALT 30 04/04/2021   ALBUMIN 4.6 04/04/2021   ALKPHOS 89 04/04/2021   AMYLASE 30 02/27/2010   LIPASE 22 10/12/2012    Electrolytes Lab Results  Component Value Date   NA 140 11/15/2021   K 5.2 (H) 11/15/2021   CL 105 11/15/2021   CALCIUM 9.4 11/15/2021   MG 2.1 01/04/2016    Bone Lab Results  Component Value Date   25OHVITD1 49 01/04/2016   25OHVITD2 25 01/04/2016   25OHVITD3 24 01/04/2016    Inflammation (CRP: Acute Phase) (ESR: Chronic Phase) Lab Results  Component Value Date   CRP <0.5 01/04/2016   ESRSEDRATE 17 01/04/2016         Note: Above Lab results reviewed.  Recent Imaging Review  DG UGI W SINGLE CM (SOL OR THIN BA) CLINICAL DATA:  Nausea and vomiting.  Gastric bypass.  EXAM: UPPER GI SERIES WITH KUB  TECHNIQUE: After obtaining a scout radiograph a routine upper GI series was performed using thin and high density barium.  FLUOROSCOPY: Radiation Exposure Index (as provided by the fluoroscopic device): 38 mGy Kerma  COMPARISON:  04/26/2019.  FINDINGS: Scout view of the abdomen shows a fair  amount of stool in the colon. Surgical clips in the right upper quadrant and right lower quadrant. Streaky scarring in the lung bases.  Single contrast examination of the upper gastrointestinal tract shows normal esophageal motility. No esophageal fold thickening, stricture or obstruction. Tiny hiatal hernia. Gastric bypass. Significant reflux of contrast into the upper esophagus with associated stasis.  IMPRESSION: 1. Significant gastroesophageal reflux to the upper esophagus with subsequent stasis of contrast. 2. Tiny hiatal hernia. 3. Gastric bypass.  Electronically Signed   By: Lorin Picket M.D.   On: 03/06/2022 08:37 Note: Reviewed        Physical Exam  General appearance: Well nourished, well developed, and well hydrated. In no apparent acute distress Mental status: Alert, oriented x 3 (person, place, &  time)       Respiratory: No evidence of acute respiratory distress Eyes: PERLA Vitals: There were no vitals taken for this visit. BMI: Estimated body mass index is 32.45 kg/m as calculated from the following:   Height as of 01/23/22: $RemoveBef'5\' 5"'teJZxwTeem$  (1.651 m).   Weight as of 01/23/22: 195 lb (88.5 kg). Ideal: Patient weight not recorded  Assessment   Diagnosis Status  No diagnosis found. Controlled Controlled Controlled   Updated Problems: No problems updated.  Plan of Care  Problem-specific:  No problem-specific Assessment & Plan notes found for this encounter.  Ms. Mahrosh Donnell has a current medication list which includes the following long-term medication(s): bupropion, diclofenac sodium, doxepin, fluoxetine, morphine, morphine, and morphine.  Pharmacotherapy (Medications Ordered): No orders of the defined types were placed in this encounter.  Orders:  No orders of the defined types were placed in this encounter.  Follow-up plan:   No follow-ups on file.     Interventional Therapies  Risk  Complexity Considerations:   Estimated body mass index is 32.45  kg/m as calculated from the following:   Height as of this encounter: $RemoveBeforeD'5\' 5"'NCDyluzMHogAGZ$  (1.651 m).   Weight as of this encounter: 195 lb (88.5 kg). NOTE: CONTRAST ALLERGY (Anaphylaxis)   Planned  Pending:      Under consideration:   Diagnostic left costochondral joint injection  Diagnostic left C2 + TON NB    Completed:   Palliative left intercostal NB (T5 to T8) x1 (03/03/2017)  Palliative left GONB Blk x2 (07/17/2016)  Palliative bilateral SI Blk x1 (04/02/2018)    Therapeutic  Palliative (PRN) options:   Palliative left intercostal NB (T5 to T8) #2  Palliative left GONB Blk #3  Palliative bilateral SI Blk #2      Recent Visits Date Type Provider Dept  01/23/22 Office Visit [name], MD [facility]  Showing recent visits within past 90 days and meeting all other requirements Future Appointments Date Type Provider Dept  04/24/22 Appointment [name], MD [facility]  Showing future appointments within next 90 days and meeting all other requirements  I discussed the assessment and treatment plan with the patient. The patient was provided an opportunity to ask questions and all were answered. The patient agreed with the plan and demonstrated an understanding of the instructions.  Patient advised to call back or seek an in-person evaluation if the symptoms or condition worsens.  Duration of encounter: *** minutes.  Total time on encounter, as per AMA guidelines included both the face-to-face and non-face-to-face time personally spent by the physician and/or other qualified health care professional(s) on the day of the encounter (includes time in activities that require the physician or other qualified health care professional and does not include time in activities normally performed by clinical staff). Physician's time may include the following activities when performed: preparing to see the patient (eg, review of tests, pre-charting review of  records) obtaining and/or reviewing separately obtained history performing a medically appropriate examination and/or evaluation counseling and educating the patient/family/caregiver ordering medications, tests, or procedures referring and communicating with other health care professionals (when not separately reported) documenting clinical information in the electronic or other health record independently interpreting results (not separately reported) and communicating results to the patient/ family/caregiver care coordination (not separately reported)  Note by: Gaspar Cola, MD Date: 04/24/2022; Time: 4:42 PM

## 2022-04-24 ENCOUNTER — Ambulatory Visit: Payer: Medicare Other | Attending: Pain Medicine | Admitting: Pain Medicine

## 2022-04-24 ENCOUNTER — Other Ambulatory Visit: Payer: Self-pay

## 2022-04-24 ENCOUNTER — Encounter: Payer: Self-pay | Admitting: Pain Medicine

## 2022-04-24 VITALS — BP 158/72 | HR 77 | Temp 97.1°F | Resp 16 | Ht 65.0 in | Wt 190.0 lb

## 2022-04-24 DIAGNOSIS — R109 Unspecified abdominal pain: Secondary | ICD-10-CM | POA: Insufficient documentation

## 2022-04-24 DIAGNOSIS — M542 Cervicalgia: Secondary | ICD-10-CM | POA: Insufficient documentation

## 2022-04-24 DIAGNOSIS — R1011 Right upper quadrant pain: Secondary | ICD-10-CM | POA: Diagnosis not present

## 2022-04-24 DIAGNOSIS — R1012 Left upper quadrant pain: Secondary | ICD-10-CM | POA: Insufficient documentation

## 2022-04-24 DIAGNOSIS — Z79891 Long term (current) use of opiate analgesic: Secondary | ICD-10-CM

## 2022-04-24 DIAGNOSIS — G894 Chronic pain syndrome: Secondary | ICD-10-CM

## 2022-04-24 DIAGNOSIS — M545 Low back pain, unspecified: Secondary | ICD-10-CM

## 2022-04-24 DIAGNOSIS — G8929 Other chronic pain: Secondary | ICD-10-CM | POA: Diagnosis not present

## 2022-04-24 DIAGNOSIS — Z79899 Other long term (current) drug therapy: Secondary | ICD-10-CM

## 2022-04-24 DIAGNOSIS — R10A2 Flank pain, left side: Secondary | ICD-10-CM

## 2022-04-24 MED ORDER — MORPHINE SULFATE 15 MG PO TABS: 15.0000 mg | ORAL_TABLET | Freq: Three times a day (TID) | ORAL | 0 refills | Status: DC | PRN

## 2022-04-24 MED ORDER — NALOXONE HCL 4 MG/0.1ML NA LIQD: 1.0000 | NASAL | 0 refills | Status: DC | PRN

## 2022-04-24 MED ORDER — KETOROLAC TROMETHAMINE 60 MG/2ML IM SOLN
60.0000 mg | Freq: Once | INTRAMUSCULAR | Status: AC
  Administered 2022-04-24: 60 mg via INTRAMUSCULAR
  Filled 2022-04-24: qty 2

## 2022-04-24 NOTE — Patient Instructions (Signed)

## 2022-04-24 NOTE — Progress Notes (Signed)
Nursing Pain Medication Assessment:  Safety precautions to be maintained throughout the outpatient stay will include: orient to surroundings, keep bed in low position, maintain call bell within reach at all times, provide assistance with transfer out of bed and ambulation.  Medication Inspection Compliance: Pill count conducted under aseptic conditions, in front of the patient. Neither the pills nor the bottle was removed from the patient's sight at any time. Once count was completed pills were immediately returned to the patient in their original bottle.  Medication: Morphine IR Pill/Patch Count:  22 of 90 pills remain Pill/Patch Appearance: Markings consistent with prescribed medication Bottle Appearance: Standard pharmacy container. Clearly labeled. Filled Date: 08 / 22 / 2023 Last Medication intake:  Today

## 2022-05-01 DIAGNOSIS — M542 Cervicalgia: Secondary | ICD-10-CM | POA: Diagnosis not present

## 2022-05-01 DIAGNOSIS — R262 Difficulty in walking, not elsewhere classified: Secondary | ICD-10-CM | POA: Diagnosis not present

## 2022-05-08 DIAGNOSIS — M542 Cervicalgia: Secondary | ICD-10-CM | POA: Diagnosis not present

## 2022-05-08 DIAGNOSIS — R262 Difficulty in walking, not elsewhere classified: Secondary | ICD-10-CM | POA: Diagnosis not present

## 2022-05-15 DIAGNOSIS — R262 Difficulty in walking, not elsewhere classified: Secondary | ICD-10-CM | POA: Diagnosis not present

## 2022-05-15 DIAGNOSIS — M542 Cervicalgia: Secondary | ICD-10-CM | POA: Diagnosis not present

## 2022-05-16 DIAGNOSIS — M25561 Pain in right knee: Secondary | ICD-10-CM | POA: Diagnosis not present

## 2022-05-16 DIAGNOSIS — M1711 Unilateral primary osteoarthritis, right knee: Secondary | ICD-10-CM | POA: Diagnosis not present

## 2022-05-16 DIAGNOSIS — M25562 Pain in left knee: Secondary | ICD-10-CM | POA: Diagnosis not present

## 2022-05-16 DIAGNOSIS — M170 Bilateral primary osteoarthritis of knee: Secondary | ICD-10-CM | POA: Diagnosis not present

## 2022-05-16 DIAGNOSIS — M1712 Unilateral primary osteoarthritis, left knee: Secondary | ICD-10-CM | POA: Diagnosis not present

## 2022-05-28 DIAGNOSIS — M810 Age-related osteoporosis without current pathological fracture: Secondary | ICD-10-CM | POA: Diagnosis not present

## 2022-05-29 DIAGNOSIS — Z23 Encounter for immunization: Secondary | ICD-10-CM | POA: Diagnosis not present

## 2022-07-24 ENCOUNTER — Encounter: Payer: Self-pay | Admitting: Pain Medicine

## 2022-07-24 ENCOUNTER — Ambulatory Visit: Payer: Medicare Other | Attending: Pain Medicine | Admitting: Pain Medicine

## 2022-07-24 VITALS — BP 161/81 | Temp 97.2°F | Resp 16 | Ht 65.5 in | Wt 185.0 lb

## 2022-07-24 DIAGNOSIS — Z79891 Long term (current) use of opiate analgesic: Secondary | ICD-10-CM | POA: Diagnosis not present

## 2022-07-24 DIAGNOSIS — R937 Abnormal findings on diagnostic imaging of other parts of musculoskeletal system: Secondary | ICD-10-CM | POA: Diagnosis not present

## 2022-07-24 DIAGNOSIS — G8929 Other chronic pain: Secondary | ICD-10-CM | POA: Insufficient documentation

## 2022-07-24 DIAGNOSIS — R1011 Right upper quadrant pain: Secondary | ICD-10-CM | POA: Diagnosis not present

## 2022-07-24 DIAGNOSIS — M542 Cervicalgia: Secondary | ICD-10-CM | POA: Diagnosis not present

## 2022-07-24 DIAGNOSIS — R1012 Left upper quadrant pain: Secondary | ICD-10-CM | POA: Diagnosis not present

## 2022-07-24 DIAGNOSIS — M545 Low back pain, unspecified: Secondary | ICD-10-CM | POA: Diagnosis not present

## 2022-07-24 DIAGNOSIS — R109 Unspecified abdominal pain: Secondary | ICD-10-CM | POA: Diagnosis not present

## 2022-07-24 DIAGNOSIS — Z79899 Other long term (current) drug therapy: Secondary | ICD-10-CM | POA: Diagnosis not present

## 2022-07-24 DIAGNOSIS — G894 Chronic pain syndrome: Secondary | ICD-10-CM | POA: Diagnosis not present

## 2022-07-24 MED ORDER — MORPHINE SULFATE 15 MG PO TABS: 15.0000 mg | ORAL_TABLET | Freq: Three times a day (TID) | ORAL | 0 refills | Status: DC | PRN

## 2022-07-24 MED ORDER — KETOROLAC TROMETHAMINE 60 MG/2ML IM SOLN
60.0000 mg | Freq: Once | INTRAMUSCULAR | Status: AC
  Administered 2022-07-24: 60 mg via INTRAMUSCULAR
  Filled 2022-07-24: qty 2

## 2022-07-24 NOTE — Progress Notes (Signed)
PROVIDER NOTE: Information contained herein reflects review and annotations entered in association with encounter. Interpretation of such information and data should be left to medically-trained personnel. Information provided to patient can be located elsewhere in the medical record under "Patient Instructions". Document created using STT-dictation technology, any transcriptional errors that may result from process are unintentional.    Patient: Kari Gibbs  Service Category: E/M  Provider: Gaspar Cola, MD  DOB: 1951/01/18  DOS: 07/24/2022  Referring Provider: Cari Caraway, MD  MRN: [id number]  Specialty: Interventional Pain Management  PCP: Kari Caraway, MD  Type: Established Patient  Setting: Ambulatory outpatient    Location: Office  Delivery: Face-to-face     HPI  [name], a 71 y.o. year old female, is here today because of her Chronic pain syndrome [G89.4]. [name] primary complain today is Abdominal Pain (left) Last encounter: My last encounter with her was on 04/24/2022. Pertinent problems: [name] has Fracture of rib (4th and 8th rib) (Left); Chronic pain syndrome; Chronic flank pain (1ry area of Pain) (Left); Chronic thoracic radicular pain (Left-sided); Chronic occipital neuralgia (Left); Neuropathic pain; Neurogenic pain; History of rib fracture (4th and 8th rib) (Left); Vertebral body hemangioma (T11); Metatarsalgia of foot (Right); Chronic abdominal pain (2ry area of Pain) (Bilateral) (L>R); Primary osteoarthritis; Chronic neck pain (4th area of Pain) (Bilateral) (L>R); Cervical facet syndrome (Bilateral) (L>R); Muscle spasticity; Tremors of nervous system; Muscle twitching; Cervical spondylosis; Chronic low back pain (3ry area of Pain) (Bilateral) w/o sciatica; Acute costochondritis (Left); Chronic low back pain (Bilateral) w/ sciatica (Bilateral); Chronic sacroiliac joint pain (Bilateral); Other specified dorsopathies, sacral and sacrococcygeal  region; Chronic sacroiliac joint somatic dysfunction (Bilateral); Chronic postoperative pain; Epigastric pain; Left upper quadrant pain; Generalized pain; Headache; Rib pain on left side; and Abnormal MRI, thoracic spine open (08/26/2013) on their pertinent problem list. Pain Assessment: Severity of Chronic pain is reported as a 8 /10. Location: Abdomen Left, Upper/on side. Onset: More than a month ago. Quality: Aching, Sore, Tightness, Constant, Tender, Discomfort. Timing: Constant. Modifying factor(s): medication, rest, heat. Vitals:  height is 5' 5.5" (1.664 m) and weight is 185 lb (83.9 kg). Her temperature is 97.2 F (36.2 C) (abnormal). Her blood pressure is 161/81 (abnormal). Her respiration is 16 and oxygen saturation is 99%.  BMI: Estimated body mass index is 30.32 kg/m as calculated from the following:   Height as of this encounter: 5' 5.5" (1.664 m).   Weight as of this encounter: 185 lb (83.9 kg).  Reason for encounter: medication management.  The patient indicates doing well with the current medication regimen. No adverse reactions or side effects reported to the medications.   PMP reveals third of 3 prescriptions written on 04/24/2022 to have been filled on 07/01/2022.  The patient continues to take lorazepam 1 mg tablet twice daily.  Today the patient comes in with a flareup of her left lower back and intercostal pain.  Review of the thoracic MRI shows a subtle area of sclerosis in the left anterolateral eighth rib most compatible with a healing nondisplaced fracture.  In view of this, today we have ordered a Toradol 60 mg IM injection to help her with this and we will schedule her for a left intercostal nerve block.  In addition, today I spent quite some time with them going over some questions that they had related to the use of CBD.  RTCB: 10/29/2022  Nonopioids transferred 05/29/2020: Gabapentin and Voltaren gel  Pharmacotherapy Assessment  Analgesic: MS IR 15 mg  mg, 1 tab PO q 8  hrs (45 mg/day of morphine) MME/day: 45 mg/day.   Monitoring: Kari Gibbs PMP: PDMP reviewed during this encounter.       Pharmacotherapy: No side-effects or adverse reactions reported. Compliance: No problems identified. Effectiveness: Clinically acceptable.  Kari Specking, RN  07/24/2022  2:43 PM  Sign when Signing Visit Nursing Pain Medication Assessment:  Safety precautions to be maintained throughout the outpatient stay will include: orient to surroundings, keep bed in low position, maintain call bell within reach at all times, provide assistance with transfer out of bed and ambulation.  Medication Inspection Compliance: Pill count conducted under aseptic conditions, in front of the patient. Neither the pills nor the bottle was removed from the patient's sight at any time. Once count was completed pills were immediately returned to the patient in their original bottle.  Medication: Morphine IR Pill/Patch Count:  20 of 90 pills remain Pill/Patch Appearance: Markings consistent with prescribed medication Bottle Appearance: Standard pharmacy container. Clearly labeled. Filled Date: 65 / 20 / 2023 Last Medication intake:  Today    No results found for: "CBDTHCR" No results found for: "D8THCCBX" No results found for: "D9THCCBX"  UDS:  Summary  Date Value Ref Range Status  01/23/2022 Note  Final    Comment:    ==================================================================== [employer] Select 13 (MW) ==================================================================== Test                             Result       Flag       Units  Drug Present and Declared for Prescription Verification   Lorazepam                      >1290        EXPECTED   ng/mg creat    Source of lorazepam is a scheduled prescription medication.    Morphine                       >6452        EXPECTED   ng/mg creat   Normorphine                    491          EXPECTED   ng/mg creat    Potential sources of large  amounts of morphine in the absence of    codeine include administration of morphine or use of heroin.     Normorphine is an expected metabolite of morphine.    Hydromorphone                  284          EXPECTED   ng/mg creat    Hydromorphone may be administered as a scheduled prescription    medication and is also an expected metabolite of hydrocodone.    Hydromorphone is also a minor metabolite of morphine which is also    present in this specimen. Concentrations of hydromorphone rarely    exceed 5% of the morphine concentration when metabolism of morphine    is the sole source of hydromorphone.  Drug Absent but Declared for Prescription Verification   Hydrocodone                    Not Detected UNEXPECTED ng/mg creat    Hydrocodone is almost always present in patients taking this drug  consistently. Absence of hydrocodone could be due to lapse of time    since the last dose or unusual pharmacokinetics (rapid metabolism).  ==================================================================== Test                      Result    Flag   Units      Ref Range   Creatinine              155              mg/dL      >=20 ==================================================================== Declared Medications:  The flagging and interpretation on this report are based on the  following declared medications.  Unexpected results may arise from  inaccuracies in the declared medications.   **Note: The testing scope of this panel includes these medications:   Hydrocodone (Norco)  Lorazepam (Ativan)  Morphine (MSIR)   **Note: The testing scope of this panel does not include the  following reported medications:   Acetaminophen (Tylenol)  Acetaminophen (Norco)  Biotin  Bupropion (Wellbutrin XL)  Buspirone (Buspar)  Cholecalciferol  Cranberry  Cyanocobalamin  Diclofenac (Voltaren)  Doxepin (Sinequan)  Estradiol (Estrace)  Eye Drop  Fluoxetine (Prozac)  Folic Acid  Irbesartan  (Avapro)  Iron  Loratadine (Claritin)  Magnesium  Montelukast  Multivitamin  Naloxone (Narcan)  Potassium  Probiotic  Supplement  Vitamin C ==================================================================== For clinical consultation, please call [phone]. ====================================================================       ROS  Constitutional: Denies any fever or chills Gastrointestinal: No reported hemesis, hematochezia, vomiting, or acute GI distress Musculoskeletal: Denies any acute onset joint swelling, redness, loss of ROM, or weakness Neurological: No reported episodes of acute onset apraxia, aphasia, dysarthria, agnosia, amnesia, paralysis, loss of coordination, or loss of consciousness  Medication Review  Benefiber, Biotin, Cranberry, Eszopiclone, FLUoxetine, Iron-Vitamin C, LORazepam, Magnesium, Peppermint Oil, Polyethyl Glycol-Propyl Glycol, Prenatal MV & Min w/FA-DHA, RA Probiotic Gummies, Vitamin D3, acetaminophen, benzonatate, buPROPion, busPIRone, diclofenac Sodium, esomeprazole, estradiol, irbesartan, lansoprazole, loratadine, montelukast, morphine, naloxone, potassium gluconate, and vitamin B-12  History Review  Allergy: Ms. Demeter is allergic to iohexol, ace inhibitors, losartan potassium, neosporin [neomycin-bacitracin zn-polymyx], and bacitracin-polymyxin b. Drug: Ms. Alkins  reports no history of drug use. Alcohol:  reports current alcohol use. Tobacco:  reports that she has never smoked. She has never used smokeless tobacco. Social: Ms. Ruotolo  reports that she has never smoked. She has never used smokeless tobacco. She reports current alcohol use. She reports that she does not use drugs. 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 Surgical: Ms. Briceno  has a past surgical history that includes Gastric bypass (11/13/2009); Exploratory laparotomy (2012); TMJ- LEFT--ABOUT 10 YRS AGO--LEFT SIDE--PT NOW HAS SOME PAIN LEFT JAW (Left); laparoscopy (03/04/2012); Hernia repair (2011); Abdominal hysterectomy (1988); Cholecystectomy (N/A, 05/18/2013); Shoulder surgery (Right, 06/16/2017); Endovenous ablation saphenous vein w/ laser (Left, 02/04/2018); Esophageal manometry (N/A, 04/05/2019); Video bronchoscopy (Bilateral, 07/14/2019); Open reduction internal fixation (orif) distal radial fracture (Right, 04/10/2020); Knee arthroscopy (Bilateral); and Ventral hernia repair (N/A, 12/07/2021). Family: family history includes Alzheimer's disease in her father; Hypertension in her mother.  Laboratory Chemistry Profile   Renal Lab Results  Component Value Date  BUN 19 11/15/2021   CREATININE 0.90 11/15/2021   GFRAA >60 01/04/2016   GFRNONAA >60 11/15/2021    Hepatic Lab Results  Component Value Date   AST 29 04/04/2021   ALT 30 04/04/2021   ALBUMIN 4.6 04/04/2021   ALKPHOS 89 04/04/2021   AMYLASE 30 02/27/2010   LIPASE 22 10/12/2012    Electrolytes Lab Results  Component Value Date   NA 140 11/15/2021   K 5.2 (H) 11/15/2021   CL 105 11/15/2021   CALCIUM 9.4 11/15/2021   MG 2.1 01/04/2016    Bone Lab Results  Component Value Date   25OHVITD1 49 01/04/2016   25OHVITD2 25 01/04/2016   25OHVITD3 24 01/04/2016    Inflammation (CRP: Acute Phase) (ESR: Chronic Phase) Lab Results  Component Value Date   CRP <0.5 01/04/2016   ESRSEDRATE 17 01/04/2016         Note: Above Lab results reviewed.  Recent Imaging Review  DG UGI W SINGLE CM (SOL OR THIN BA) CLINICAL DATA:  Nausea and  vomiting.  Gastric bypass.  EXAM: UPPER GI SERIES WITH KUB  TECHNIQUE: After obtaining a scout radiograph a routine upper GI series was performed using thin and high density barium.  FLUOROSCOPY: Radiation Exposure Index (as provided by the fluoroscopic device): 38 mGy Kerma  COMPARISON:  04/26/2019.  FINDINGS: Scout view of the abdomen shows a fair amount of stool in the colon. Surgical clips in the right upper quadrant and right lower quadrant. Streaky scarring in the lung bases.  Single contrast examination of the upper gastrointestinal tract shows normal esophageal motility. No esophageal fold thickening, stricture or obstruction. Tiny hiatal hernia. Gastric bypass. Significant reflux of contrast into the upper esophagus with associated stasis.  IMPRESSION: 1. Significant gastroesophageal reflux to the upper esophagus with subsequent stasis of contrast. 2. Tiny hiatal hernia. 3. Gastric bypass.  Electronically Signed   By: Lorin Picket M.D.   On: 03/06/2022 08:37 Note: Reviewed        Physical Exam  General appearance: Well nourished, well developed, and well hydrated. In no apparent acute distress Mental status: Alert, oriented x 3 (person, place, & time)       Respiratory: No evidence of acute respiratory distress Eyes: PERLA Vitals: BP (!) 161/81   Temp (!) 97.2 F (36.2 C)   Resp 16   Ht 5' 5.5" (1.664 m)   Wt 185 lb (83.9 kg)   SpO2 99%   BMI 30.32 kg/m  BMI: Estimated body mass index is 30.32 kg/m as calculated from the following:   Height as of this encounter: 5' 5.5" (1.664 m).   Weight as of this encounter: 185 lb (83.9 kg). Ideal: Ideal body weight: 58.2 kg (128 lb 3.2 oz) Adjusted ideal body weight: 68.5 kg (150 lb 14.7 oz)  Assessment   Diagnosis Status  1. Chronic pain syndrome   2. Chronic flank pain (1ry area of Pain) (Left)   3. Chronic abdominal pain (2ry area of Pain) (Bilateral) (L>R)   4. Chronic low back pain (3ry area of  Pain) (Bilateral) w/o sciatica   5. Chronic neck pain (4th area of Pain) (Bilateral) (L>R)   6. Pharmacologic therapy   7. Chronic use of opiate for therapeutic purpose   8. Encounter for medication management   9. Encounter for chronic pain management   10. Acute exacerbation of chronic low back pain   11. Abnormal MRI, thoracic spine open (08/26/2013)    Controlled Controlled Controlled   Updated Problems: Problem  Abnormal MRI, thoracic spine open (08/26/2013)   Thoracic MRI shows a subtle area of sclerosis in the left anterolateral eighth rib most compatible with a healing nondisplaced fracture.     Plan of Care  Problem-specific:  No problem-specific Assessment & Plan notes found for this encounter.  Ms. Farrah Skoda has a current medication list which includes the following long-term medication(s): bupropion, diclofenac sodium, eszopiclone, fluoxetine, lansoprazole, [START ON 07/31/2022] morphine, [START ON 08/30/2022] morphine, and [START ON 09/29/2022] morphine.  Pharmacotherapy (Medications Ordered): Meds ordered this encounter  Medications   morphine (MSIR) 15 MG tablet    Sig: Take 1 tablet (15 mg total) by mouth every 8 (eight) hours as needed. Must last 30 days.    Dispense:  90 tablet    Refill:  0    DO NOT: delete (not duplicate); no partial-fill (will deny script to complete), no refill request (F/U required). DISPENSE: 1 day early if closed on fill date. WARN: No CNS-depressants within 8 hrs of med.   morphine (MSIR) 15 MG tablet    Sig: Take 1 tablet (15 mg total) by mouth every 8 (eight) hours as needed. Must last 30 days.    Dispense:  90 tablet    Refill:  0    DO NOT: delete (not duplicate); no partial-fill (will deny script to complete), no refill request (F/U required). DISPENSE: 1 day early if closed on fill date. WARN: No CNS-depressants within 8 hrs of med.   morphine (MSIR) 15 MG tablet    Sig: Take 1 tablet (15 mg total) by mouth every 8 (eight)  hours as needed. Must last 30 days.    Dispense:  90 tablet    Refill:  0    DO NOT: delete (not duplicate); no partial-fill (will deny script to complete), no refill request (F/U required). DISPENSE: 1 day early if closed on fill date. WARN: No CNS-depressants within 8 hrs of med.   ketorolac (TORADOL) injection 60 mg   Orders:  Orders Placed This Encounter  Procedures   INTERCOSTAL NERVE BLOCK    Standing Status:   Future    Standing Expiration Date:   10/23/2022    Scheduling Instructions:     Side: Left-sided     Sedation: With Sedation.     Timeframe: ASAA    Order Specific Question:   Where will this procedure be performed?    Answer:   [facility]   Follow-up plan:   Return for [facility]): (L) 8th rib intercostal NB.     Interventional Therapies  Risk  Complexity Considerations:   Estimated body mass index is 32.45 kg/m as calculated from the following:   Height as of this encounter: _0  (1.651 m).   Weight as of this encounter: 195 lb (88.5 kg). NOTE: CONTRAST ALLERGY (Anaphylaxis)   Planned  Pending:   Diagnostic/therapeutic left eighth rib intercostal NB    Under consideration:   Diagnostic left costochondral joint injection  Diagnostic left C2 + TON NB    Completed:   Palliative left intercostal NB (T5 to T8) x1 (03/03/2017)  Palliative left GONB Blk x2 (07/17/2016)  Palliative bilateral SI Blk x1 (04/02/2018)    Therapeutic  Palliative (PRN) options:   Palliative left intercostal NB (T5 to T8) #2  Palliative left GONB Blk #3  Palliative bilateral SI Blk #2    Pharmacotherapy:  Nonopioids transferred 05/29/2020: Gabapentin and Voltaren gel Recommendations:   None at this time.      Recent Visits No  visits were found meeting these conditions. Showing recent visits within past 90 days and meeting all other requirements Today's Visits Date Type Provider Dept  07/24/22 Office Visit [name], MD [facility]  Showing today's  visits and meeting all other requirements Future Appointments No visits were found meeting these conditions. Showing future appointments within next 90 days and meeting all other requirements  I discussed the assessment and treatment plan with the patient. The patient was provided an opportunity to ask questions and all were answered. The patient agreed with the plan and demonstrated an understanding of the instructions.  Patient advised to call back or seek an in-person evaluation if the symptoms or condition worsens.  Duration of encounter: 41 minutes.  Total time on encounter, as per AMA guidelines included both the face-to-face and non-face-to-face time personally spent by the physician and/or other qualified health care professional(s) on the day of the encounter (includes time in activities that require the physician or other qualified health care professional and does not include time in activities normally performed by clinical staff). Physician's time may include the following activities when performed: preparing to see the patient (eg, review of tests, pre-charting review of records) obtaining and/or reviewing separately obtained history performing a medically appropriate examination and/or evaluation counseling and educating the patient/family/caregiver ordering medications, tests, or procedures referring and communicating with other health care professionals (when not separately reported) documenting clinical information in the electronic or other health record independently interpreting results (not separately reported) and communicating results to the patient/ family/caregiver care coordination (not separately reported)  Note by: Kari Cola, MD Date: 07/24/2022; Time: 3:24 PM

## 2022-07-24 NOTE — Progress Notes (Signed)
Nursing Pain Medication Assessment:  Safety precautions to be maintained throughout the outpatient stay will include: orient to surroundings, keep bed in low position, maintain call bell within reach at all times, provide assistance with transfer out of bed and ambulation.  Medication Inspection Compliance: Pill count conducted under aseptic conditions, in front of the patient. Neither the pills nor the bottle was removed from the patient's sight at any time. Once count was completed pills were immediately returned to the patient in their original bottle.  Medication: Morphine IR Pill/Patch Count:  20 of 90 pills remain Pill/Patch Appearance: Markings consistent with prescribed medication Bottle Appearance: Standard pharmacy container. Clearly labeled. Filled Date: 62 / 20 / 2023 Last Medication intake:  Today

## 2022-07-24 NOTE — Patient Instructions (Addendum)
______________________________________________________________________  Preparing for your procedure  During your procedure appointment there will be: No Prescription Refills. No disability issues to discussed. No medication changes or discussions.  Instructions: Food intake: Avoid eating anything solid for at least 8 hours prior to your procedure. Clear liquid intake: You may take clear liquids such as water up to 2 hours prior to your procedure. (No carbonated drinks. No soda.) Transportation: Unless otherwise stated by your physician, bring a driver. Morning Medicines: Except for blood thinners, take all of your other morning medications with a sip of water. Make sure to take your heart and blood pressure medicines. If your blood pressure's lower number is above 100, the case will be rescheduled. Blood thinners: If you take a blood thinner, but were not instructed to stop it, call our office [phone] and ask to talk to a nurse. Not stopping a blood thinner prior to certain procedures could lead to serious complications. Diabetics on insulin: Notify the staff so that you can be scheduled 1st case in the morning. If your diabetes requires high dose insulin, take only  of your normal insulin dose the morning of the procedure and notify the staff that you have done so. Preventing infections: Shower with an antibacterial soap the morning of your procedure.  Build-up your immune system: Take 1000 mg of Vitamin C with every meal (3 times a day) the day prior to your procedure. Antibiotics: Inform the nursing staff if you are taking any antibiotics or if you have any conditions that may require antibiotics prior to procedures. (Example: recent joint implants)   Pregnancy: If you are pregnant make sure to notify the nursing staff. Not doing so may result in injury to the fetus, including death.  Sickness: If you have a cold, fever, or any active infections, call and cancel or reschedule your  procedure. Receiving steroids while having an infection may result in complications. Arrival: You must be in the facility at least 30 minutes prior to your scheduled procedure. Tardiness: Your scheduled time is also the cutoff time. If you do not arrive at least 15 minutes prior to your procedure, you will be rescheduled.  Children: Do not bring any children with you. Make arrangements to keep them home. Dress appropriately: There is always a possibility that your clothing may get soiled. Avoid long dresses. Valuables: Do not bring any jewelry or valuables.  Reasons to call and reschedule or cancel your procedure: (Following these recommendations will minimize the risk of a serious complication.) Surgeries: Avoid having procedures within 2 weeks of any surgery. (Avoid for 2 weeks before or after any surgery). Flu Shots: Avoid having procedures within 2 weeks of a flu shots or . (Avoid for 2 weeks before or after immunizations). Barium: Avoid having a procedure within 7-10 days after having had a radiological study involving the use of radiological contrast. (Myelograms, Barium swallow or enema study). Heart attacks: Avoid any elective procedures or surgeries for the initial 6 months after a "Myocardial Infarction" (Heart Attack). Blood thinners: It is imperative that you stop these medications before procedures. Let us know if you if you take any blood thinner.  Infection: Avoid procedures during or within two weeks of an infection (including chest colds or gastrointestinal problems). Symptoms associated with infections include: Localized redness, fever, chills, night sweats or profuse sweating, burning sensation when voiding, cough, congestion, stuffiness, runny nose, sore throat, diarrhea, nausea, vomiting, cold or Flu symptoms, recent or current infections. It is specially important if the infection is   over the area that we intend to treat. Heart and lung problems: Symptoms that may suggest an  active cardiopulmonary problem include: cough, chest pain, breathing difficulties or shortness of breath, dizziness, ankle swelling, uncontrolled high or unusually low blood pressure, and/or palpitations. If you are experiencing any of these symptoms, cancel your procedure and contact your primary care physician for an evaluation.  Remember:  Regular Business hours are:  Monday to Thursday 8:00 AM to 4:00 PM  Provider's Schedule: Lashan Macias, MD:  Procedure days: Tuesday and Thursday 7:30 AM to 4:00 PM  Bilal Lateef, MD:  Procedure days: Monday and Wednesday 7:30 AM to 4:00 PM  ______________________________________________________________________    ____________________________________________________________________________________________  General Risks and Possible Complications  Patient Responsibilities: It is important that you read this as it is part of your informed consent. It is our duty to inform you of the risks and possible complications associated with treatments offered to you. It is your responsibility as a patient to read this and to ask questions about anything that is not clear or that you believe was not covered in this document.  Patient's Rights: You have the right to refuse treatment. You also have the right to change your mind, even after initially having agreed to have the treatment done. However, under this last option, if you wait until the last second to change your mind, you may be charged for the materials used up to that point.  Introduction: Medicine is not an exact science. Everything in Medicine, including the lack of treatment(s), carries the potential for danger, harm, or loss (which is by definition: Risk). In Medicine, a complication is a secondary problem, condition, or disease that can aggravate an already existing one. All treatments carry the risk of possible complications. The fact that a side effects or complications occurs, does not imply  that the treatment was conducted incorrectly. It must be clearly understood that these can happen even when everything is done following the highest safety standards.  No treatment: You can choose not to proceed with the proposed treatment alternative. The "PRO(s)" would include: avoiding the risk of complications associated with the therapy. The "CON(s)" would include: not getting any of the treatment benefits. These benefits fall under one of three categories: diagnostic; therapeutic; and/or palliative. Diagnostic benefits include: getting information which can ultimately lead to improvement of the disease or symptom(s). Therapeutic benefits are those associated with the successful treatment of the disease. Finally, palliative benefits are those related to the decrease of the primary symptoms, without necessarily curing the condition (example: decreasing the pain from a flare-up of a chronic condition, such as incurable terminal cancer).  General Risks and Complications: These are associated to most interventional treatments. They can occur alone, or in combination. They fall under one of the following six (6) categories: no benefit or worsening of symptoms; bleeding; infection; nerve damage; allergic reactions; and/or death. No benefits or worsening of symptoms: In Medicine there are no guarantees, only probabilities. No healthcare provider can ever guarantee that a medical treatment will work, they can only state the probability that it may. Furthermore, there is always the possibility that the condition may worsen, either directly, or indirectly, as a consequence of the treatment. Bleeding: This is more common if the patient is taking a blood thinner, either prescription or over the counter (example: Goody Powders, Fish oil, Aspirin, Garlic, etc.), or if suffering a condition associated with impaired coagulation (example: Hemophilia, cirrhosis of the liver, low platelet counts, etc.). However, even if   you  do not have one on these, it can still happen. If you have any of these conditions, or take one of these drugs, make sure to notify your treating physician. Infection: This is more common in patients with a compromised immune system, either due to disease (example: diabetes, cancer, human immunodeficiency virus [HIV], etc.), or due to medications or treatments (example: therapies used to treat cancer and rheumatological diseases). However, even if you do not have one on these, it can still happen. If you have any of these conditions, or take one of these drugs, make sure to notify your treating physician. Nerve Damage: This is more common when the treatment is an invasive one, but it can also happen with the use of medications, such as those used in the treatment of cancer. The damage can occur to small secondary nerves, or to large primary ones, such as those in the spinal cord and brain. This damage may be temporary or permanent and it may lead to impairments that can range from temporary numbness to permanent paralysis and/or brain death. Allergic Reactions: Any time a substance or material comes in contact with our body, there is the possibility of an allergic reaction. These can range from a mild skin rash (contact dermatitis) to a severe systemic reaction (anaphylactic reaction), which can result in death. Death: In general, any medical intervention can result in death, most of the time due to an unforeseen complication. ____________________________________________________________________________________________    ____________________________________________________________________________________________  Patient Information update  To: All of our patients.  Re: Name change.  It has been made official that our current name, "[facility]"   will soon be changed to "[facility]".    The purpose of this change is to eliminate any confusion created by the concept of our practice being a "Medication Management Pain Clinic". In the past this has led to the misconception that we treat pain primarily by the use of prescription medications.  Nothing can be farther from the truth.   Understanding PAIN MANAGEMENT: To further understand what our practice does, you first have to understand that "Pain Management" is a subspecialty that requires additional training once a physician has completed their specialty training, which can be in either Anesthesia, Neurology, Psychiatry, or Physical Medicine and Rehabilitation (PMR). Each one of these contributes to the final approach taken by each physician to the management of their patient's pain. To be a "Pain Management Specialist" you must have first completed one of the specialty trainings below.  Anesthesiologists - trained in clinical pharmacology and interventional techniques such as nerve blockade and regional as well as central neuroanatomy. They are trained to block pain before, during, and after surgical interventions.  Neurologists - trained in the diagnosis and pharmacological treatment of complex neurological conditions, such as Multiple Sclerosis, Parkinson's, spinal cord injuries, and other systemic conditions that may be associated with symptoms that may include but are not limited to pain. They tend to rely primarily on the treatment of chronic pain using prescription medications.  Psychiatrist - trained in conditions affecting the psychosocial wellbeing of patients including but not limited to depression, anxiety, schizophrenia, personality disorders, addiction, and other substance use disorders that may be associated with chronic pain. They tend to rely primarily on the treatment of chronic pain using prescription medications.   Physical Medicine and Rehabilitation (PMR) physicians, also known as physiatrists - trained to treat a  wide variety of medical conditions affecting  the brain, spinal cord, nerves, bones, joints, ligaments, muscles, and tendons. Their training is primarily aimed at treating patients that have suffered injuries that have caused severe physical impairment. Their training is primarily aimed at the physical therapy and rehabilitation of those patients. They may also work alongside orthopedic surgeons or neurosurgeons using their expertise in assisting surgical patients to recover after their surgeries.  INTERVENTIONAL PAIN MANAGEMENT is sub-subspecialty of Pain Management.  Our physicians are Board-certified in Anesthesia, Pain Management, and Interventional Pain Management.  This meaning that not only have they been trained and Board-certified in their specialty of Anesthesia, and subspecialty of Pain Management, but they have also received further training in the sub-subspecialty of Interventional Pain Management, in order to become Board-certified as INTERVENTIONAL PAIN MANAGEMENT SPECIALIST.    Mission: Our goal is to use our skills in  [place] as alternatives to the chronic use of prescription opioid medications for the treatment of pain. To make this more clear, we have changed our name to reflect what we do and offer. We will continue to offer medication management assessment and recommendations, but we will not be taking over any patient's medication management.  ____________________________________________________________________________________________     ____________________________________________________________________________________________  National Pain Medication Shortage  The [place] is experiencing worsening drug shortages. These have had a negative widespread effect on patient care and treatment. Not expected to improve any time soon. Predicted to last past 2029.   Drug shortage list (generic names) Oxycodone IR Oxycodone/APAP Oxymorphone  IR Hydromorphone Hydrocodone/APAP Morphine  Where is the problem?  Manufacturing and supply level.  Will this shortage affect you?  Only if you take any of the above pain medications.  How? You may be unable to fill your prescription.  Your pharmacist may offer a "partial fill" of your prescription. (Warning: Do not accept partial fills.) Prescriptions partially filled cannot be transferred to another pharmacy. Read our Medication Rules and Regulation. Depending on how much medicine you are dependent on, you may experience withdrawals when unable to get the medication.  Recommendations: Consider ending your dependence on opioid pain medications. Ask your pain specialist to assist you with the process. Consider switching to a medication currently not in shortage, such as Buprenorphine. Talk to your pain specialist about this option. Consider decreasing your pain medication requirements by managing tolerance thru "Drug Holidays". This may help minimize withdrawals, should you run out of medicine. Control your pain thru the use of non-pharmacological interventional therapies.   Your prescriber: Prescribers cannot be blamed for shortages. Medication manufacturing and supply issues cannot be fixed by the prescriber.   NOTE: The prescriber is not responsible for supplying the medication, or solving supply issues. Work with your pharmacist to solve it. The patient is responsible for the decision to take or continue taking the medication and for identifying and securing a legal supply source. By law, supplying the medication is the job and responsibility of the pharmacy. The prescriber is responsible for the evaluation, monitoring, and prescribing of these medications.   Prescribers will NOT: Re-issue prescriptions that have been partially filled. Re-issue prescriptions already sent to a pharmacy.  Re-send prescriptions to a different pharmacy because yours did not have your medication. Ask  pharmacist to order more medicine or transfer the prescription to another pharmacy. (Read below.)  New 2023 regulation: "April 12, 2022 Revised Regulation Allows DEA-Registered Pharmacies to Transfer Electronic Prescriptions at a Patient's Request [place] Patients now have the ability to request their electronic  prescription be transferred to another pharmacy without having to go back to their practitioner to initiate the request. This revised regulation went into effect on Monday, April 08, 2022.     At a patient's request, a DEA-registered retail pharmacy can now transfer an electronic prescription for a controlled substance (schedules II-V) to another DEA-registered retail pharmacy. Prior to this change, patients would have to go through their practitioner to cancel their prescription and have it re-issued to a different pharmacy. The process was taxing and time consuming for both patients and practitioners.    The Drug Enforcement Administration [facility]) published its intent to revise the process for transferring electronic prescriptions on June 30, 2020.  The final rule was published in the federal register on March 07, 2022 and went into effect 30 days later.  Under the final rule, a prescription can only be transferred once between pharmacies, and only if allowed under existing state or other applicable law. The prescription must remain in its electronic form; may not be altered in any way; and the transfer must be communicated directly between two licensed pharmacists. It's important to note, any authorized refills transfer with the original prescription, which means the entire prescription will be filled at the same pharmacy".  Reference: [email or web address] [facility] website  announcement)  [email or web address] ([place])   [employer] / Vol. 88, No. 143 / Thursday, March 07, 2022 / Rules and Regulations DEPARTMENT OF JUSTICE  Drug Enforcement Administration  21 CFR Part 1306  [Docket No. [id number]]  RIN [id number] Transfer of Electronic Prescriptions for Schedules II-V Controlled Substances Between Pharmacies for Initial Filling  ____________________________________________________________________________________________     ____________________________________________________________________________________________  Drug Holidays  What is a "Drug Holiday"? Drug Holiday: is the name given to the process of slowly tapering down and temporarily stopping the pain medication for the purpose of decreasing or eliminating tolerance to the drug.  Benefits Improved effectiveness Decreased required effective dose Improved pain control End dependence on high dose therapy Decrease cost of therapy Uncovering "opioid-induced hyperalgesia". (OIH)  What is "opioid hyperalgesia"? It is a paradoxical increase in pain caused by exposure to opioids. Stopping the opioid pain medication, contrary to the expected, it actually decreases or completely eliminates the pain. Ref.: "A comprehensive review of opioid-induced hyperalgesia". Brion Aliment, et.al. Pain Physician. 2011 Mar-Apr;14(2):145-61.  What is tolerance? Tolerance: the progressive loss of effectiveness of a pain medicine due to repetitive use. A common problem of opioid pain medications.  How long should a "Drug Holiday" last? Effectiveness depends on the patient staying off all opioid pain medicines for a minimum of 14 consecutive days. (2 weeks)  How about just taking less of the medicine? Does not work. Will not accomplish goal of eliminating the excess receptors.  How about switching to a different pain medicine? (AKA.  "Opioid rotation") Does not work. Creates the illusion of effectiveness by taking advantage of inaccurate equivalent dose calculations between different opioids. -This "technique" was promoted by studies funded by [employer], such as [employer], creators of "OxyContin".  Can I stop the medicine "cold [place]"? Depends. You should always coordinate with your Pain Specialist to make the transition as smoothly as possible. Avoid stopping the medicine abruptly without consulting. We recommend a "slow taper".  What is a slow taper? Taper: refers to the gradual decrease in dose.   How do I stop/taper the dose? Slowly. Decrease the daily amount of pills that you take by one (1) pill every seven (7) days. This is  called a "slow downward taper". Example: if you normally take four (4) pills per day, drop it to three (3) pills per day for seven (7) days, then to two (2) pills per day for seven (7) days, then to one (1) per day for seven (7) days, and then stop the medicine. The 14 day "Drug Holiday" starts on the first day without medicine.   Will I experience withdrawals? Unlikely with a slow taper.  What triggers withdrawals? Withdrawals are triggered by the sudden/abrupt stop of high dose opioids. Withdrawals can be avoided by slowly decreasing the dose over a prolonged period of time.  What are withdrawals? Symptoms associated with sudden/abrupt reduction/stopping of high-dose, long-term use of pain medication. Withdrawal are seldom seen on low dose therapy, or patients rarely taking opioid medication.  Early Withdrawal Symptoms may include: Agitation Anxiety Muscle aches Increased tearing Insomnia Runny nose Sweating Yawning  Late symptoms may include: Abdominal cramping Diarrhea Dilated pupils Goose bumps Nausea Vomiting  (Last update: 07/21/2022) ____________________________________________________________________________________________     _______________________________________________________________________  Medication Rules  Purpose: To inform patients, and their family members, of our medication rules and regulations.  Applies to: All patients receiving prescriptions from our practice (written or electronic).  Pharmacy of record: This is the pharmacy where your electronic prescriptions will be sent. Make sure we have the correct one.  Electronic prescriptions: In compliance with the [place] (STOP) Act of 2017 (Session Lanny Cramp [phone]), effective August 12, 2018, all controlled substances must be electronically prescribed. Written prescriptions, faxing, or calling prescriptions to a pharmacy will no longer be done.  Prescription refills: These will be provided only during in-person appointments. No medications will be renewed without a "face-to-face" evaluation with your provider. Applies to all prescriptions.  NOTE: The following applies primarily to controlled substances (Opioid* Pain Medications).   Type of encounter (visit): For patients receiving controlled substances, face-to-face visits are required. (Not an option and not up to the patient.)  Patient's responsibilities: Pain Pills: Bring all pain pills to every appointment (except for procedure appointments). Pill Bottles: Bring pills in original pharmacy bottle. Bring bottle, even if empty. Always bring the bottle of the most recent fill.  Medication refills: You are responsible for knowing and keeping track of what medications you are taking and when is it that you will need a refill. The day before your appointment: write a list of all prescriptions that need to be refilled. The day of the appointment: give the list to the admitting nurse. Prescriptions will be written only during appointments. No prescriptions will be written on procedure days. If you forget a medication: it will not be "Called in", "Faxed", or  "electronically sent". You will need to get another appointment to get these prescribed. No early refills. Do not call asking to have your prescription filled early. Partial  or short prescriptions: Occasionally your pharmacy may not have enough pills to fill your prescription.  NEVER ACCEPT a partial fill or a prescription that is short of the total amount of pills that you were prescribed.  With controlled substances the law allows 72 hours for the pharmacy to complete the prescription.  If the prescription is not completed within 72 hours, the pharmacist will require a new prescription to be written. This means that you will be short on your medicine and we WILL NOT send another prescription to complete your original prescription.  Instead, request the pharmacy to send a carrier to a nearby branch to get enough medication  to provide you with your full prescription. Prescription Accuracy: You are responsible for carefully inspecting your prescriptions before leaving our office. Have the discharge nurse carefully go over each prescription with you, before taking them home. Make sure that your name is accurately spelled, that your address is correct. Check the name and dose of your medication to make sure it is accurate. Check the number of pills, and the written instructions to make sure they are clear and accurate. Make sure that you are given enough medication to last until your next medication refill appointment. Taking Medication: Take medication as prescribed. When it comes to controlled substances, taking less pills or less frequently than prescribed is permitted and encouraged. Never take more pills than instructed. Never take the medication more frequently than prescribed.  Inform other Doctors: Always inform, all of your healthcare providers, of all the medications you take. Pain Medication from other Providers: You are not allowed to accept any additional pain medication from any other Doctor or  Healthcare provider. There are two exceptions to this rule. (see below) In the event that you require additional pain medication, you are responsible for notifying us, as stated below. Cough Medicine: Often these contain an opioid, such as codeine or hydrocodone. Never accept or take cough medicine containing these opioids if you are already taking an opioid* medication. The combination may cause respiratory failure and death. Medication Agreement: You are responsible for carefully reading and following our Medication Agreement. This must be signed before receiving any prescriptions from our practice. Safely store a copy of your signed Agreement. Violations to the Agreement will result in no further prescriptions. (Additional copies of our Medication Agreement are available upon request.) Laws, Rules, & Regulations: All patients are expected to follow all Federal and [employer], [employer], Rules, [employer] [facility]. Ignorance of the Laws does not constitute a valid excuse.  Illegal drugs and Controlled Substances: The use of illegal substances (including, but not limited to marijuana and its derivatives) and/or the illegal use of any controlled substances is strictly prohibited. Violation of this rule may result in the immediate and permanent discontinuation of any and all prescriptions being written by our practice. The use of any illegal substances is prohibited. Adopted CDC guidelines & recommendations: Target dosing levels will be at or below 60 MME/day. Use of benzodiazepines** is not recommended.  Exceptions: There are only two exceptions to the rule of not receiving pain medications from other Healthcare Providers. Exception #1 (Emergencies): In the event of an emergency (i.e.: accident requiring emergency care), you are allowed to receive additional pain medication. However, you are responsible for: As soon as you are able, call our office [phone], at any time of the day or night, and leave a  message stating your name, the date and nature of the emergency, and the name and dose of the medication prescribed. In the event that your call is answered by a member of our staff, make sure to document and save the date, time, and the name of the person that took your information.  Exception #2 (Planned Surgery): In the event that you are scheduled by another doctor or dentist to have any type of surgery or procedure, you are allowed (for a period no longer than 30 days), to receive additional pain medication, for the acute post-op pain. However, in this case, you are responsible for picking up a copy of our "Post-op Pain Management for Surgeons" handout, and giving it to your surgeon or dentist. This document is  available at our office, and does not require an appointment to obtain it. Simply go to our office during business hours (Monday-Thursday from 8:00 AM to 4:00 PM) (Friday 8:00 AM to 12:00 Noon) or if you have a scheduled appointment with [place], prior to your surgery, and ask for it by name. In addition, you are responsible for: calling our office [phone], at any time of the day or night, and leaving a message stating your name, name of your surgeon, type of surgery, and date of procedure or surgery. Failure to comply with your responsibilities may result in termination of therapy involving the controlled substances. Medication Agreement Violation. Following the above rules, including your responsibilities will help you in avoiding a Medication Agreement Violation ("Breaking your Pain Medication Contract").  Consequences:  Not following the above rules may result in permanent discontinuation of medication prescription therapy.  *Opioid medications include: morphine, codeine, oxycodone, oxymorphone, hydrocodone, hydromorphone, meperidine, tramadol, tapentadol, buprenorphine, fentanyl, methadone. **Benzodiazepine medications include: diazepam (Valium), alprazolam (Xanax), clonazepam (Klonopine),  lorazepam (Ativan), clorazepate (Tranxene), chlordiazepoxide (Librium), estazolam (Prosom), oxazepam (Serax), temazepam (Restoril), triazolam (Halcion) (Last updated: 06/04/2022) ______________________________________________________________________    ______________________________________________________________________  Medication Recommendations and Reminders  Applies to: All patients receiving prescriptions (written and/or electronic).  Medication Rules & Regulations: You are responsible for reading, knowing, and following our "Medication Rules" document. These exist for your safety and that of others. They are not flexible and neither are we. Dismissing or ignoring them is an act of "non-compliance" that may result in complete and irreversible termination of such medication therapy. For safety reasons, "non-compliance" will not be tolerated. As with the [place] fundamental legal principle of "ignorance of the law is no defense", we will accept no excuses for not having read and knowing the content of documents provided to you by our practice.  Pharmacy of record:  Definition: This is the pharmacy where your electronic prescriptions will be sent.  We do not endorse any particular pharmacy. It is up to you and your insurance to decide what pharmacy to use.  We do not restrict you in your choice of pharmacy. However, once we write for your prescriptions, we will NOT be re-sending more prescriptions to fix restricted supply problems created by your pharmacy, or your insurance.  The pharmacy listed in the electronic medical record should be the one where you want electronic prescriptions to be sent. If you choose to change pharmacy, simply notify our nursing staff. Changes will be made only during your regular appointments and not over the phone.  Recommendations: Keep all of your pain medications in a safe place, under lock and key, even if you live alone. We will NOT replace lost, stolen, or  damaged medication. We do not accept "Police Reports" as proof of medications having been stolen. After you fill your prescription, take 1 week's worth of pills and put them away in a safe place. You should keep a separate, properly labeled bottle for this purpose. The remainder should be kept in the original bottle. Use this as your primary supply, until it runs out. Once it's gone, then you know that you have 1 week's worth of medicine, and it is time to come in for a prescription refill. If you do this correctly, it is unlikely that you will ever run out of medicine. To make sure that the above recommendation works, it is very important that you make sure your medication refill appointments are scheduled at least 1 week before you run out of medicine. To  do this in an effective manner, make sure that you do not leave the office without scheduling your next medication management appointment. Always ask the nursing staff to show you in your prescription , when your medication will be running out. Then arrange for the receptionist to get you a return appointment, at least 7 days before you run out of medicine. Do not wait until you have 1 or 2 pills left, to come in. This is very poor planning and does not take into consideration that we may need to cancel appointments due to bad weather, sickness, or emergencies affecting our staff. DO NOT ACCEPT A "Partial Fill": If for any reason your pharmacy does not have enough pills/tablets to completely fill or refill your prescription, do not allow for a "partial fill". The law allows the pharmacy to complete that prescription within 72 hours, without requiring a new prescription. If they do not fill the rest of your prescription within those 72 hours, you will need a separate prescription to fill the remaining amount, which we will NOT provide. If the reason for the partial fill is your insurance, you will need to talk to the pharmacist about payment alternatives for  the remaining tablets, but again, DO NOT ACCEPT A PARTIAL FILL, unless you can trust your pharmacist to obtain the remainder of the pills within 72 hours.  Prescription refills and/or changes in medication(s):  Prescription refills, and/or changes in dose or medication, will be conducted only during scheduled medication management appointments. (Applies to both, written and electronic prescriptions.) No refills on procedure days. No medication will be changed or started on procedure days. No changes, adjustments, and/or refills will be conducted on a procedure day. Doing so will interfere with the diagnostic portion of the procedure. No phone refills. No medications will be "called into the pharmacy". No Fax refills. No weekend refills. No Holliday refills. No after hours refills.  Remember:  Business hours are:  Monday to Thursday 8:00 AM to 4:00 PM Provider's Schedule: [name], MD - Appointments are:  Medication management: Monday and Wednesday 8:00 AM to 4:00 PM Procedure day: Tuesday and Thursday 7:30 AM to 4:00 PM Gillis Santa, MD - Appointments are:  Medication management: Tuesday and Thursday 8:00 AM to 4:00 PM Procedure day: Monday and Wednesday 7:30 AM to 4:00 PM (Last update: 06/04/2022) ______________________________________________________________________    ____________________________________________________________________________________________  WARNING: CBD (cannabidiol) & Delta (Delta-8 tetrahydrocannabinol) products.   Applicable to:  All individuals currently taking or considering taking CBD (cannabidiol) and, more important, all patients taking opioid analgesic controlled substances (pain medication). (Example: oxycodone; oxymorphone; hydrocodone; hydromorphone; morphine; methadone; tramadol; tapentadol; fentanyl; buprenorphine; butorphanol; dextromethorphan; meperidine; codeine; etc.)  Introduction:  Recently there has been a drive towards the use of  "natural" products for the treatment of different conditions, including pain anxiety and sleep disorders. Marijuana and hemp are two varieties of the cannabis genus plants. Marijuana and its derivatives are illegal, while hemp and its derivatives are not. Cannabidiol (CBD) and tetrahydrocannabinol (THC), are two natural compounds found in plants of the Cannabis genus. They can both be extracted from hemp or marijuana. Both compounds interact with your body's endocannabinoid system in very different ways. CBD is associated with pain relief (analgesia) while THC is associated with the psychoactive effects ("the high") obtained from the use of marijuana products. There are two main types of THC: Delta-9, which comes from the marijuana plant and it is illegal, and Delta-8, which comes from the hemp plant, and it is legal. (Both,  Delta-9-THC and Delta-8-THC are psychoactive and give you "the high".)   Legality:  Marijuana and its derivatives: illegal Hemp and its derivatives: Legal (State dependent) UPDATE: (09/28/2021) The Drug Enforcement Agency ([place]) issued a letter stating that "delta" cannabinoids, including Delta-8-THCO and Delta-9-THCO, synthetically derived from hemp do not qualify as hemp and will be viewed as Schedule I drugs. (Schedule I drugs, substances, or chemicals are defined as drugs with no currently accepted medical use and a high potential for abuse. Some examples of Schedule I drugs are: heroin, lysergic acid diethylamide (LSD), marijuana (cannabis), 3,4-methylenedioxymethamphetamine (ecstasy), methaqualone, and peyote.) (https://jennings.com/)  Legal status of CBD in [place]:  "Conditionally Legal"  Reference: "FDA Regulation of Cannabis and Cannabis-Derived Products, Including Cannabidiol (CBD)" - Se[employer]Art[employer].pt  Warning:  CBD is not FDA approved and has not undergo the same  manufacturing controls as prescription drugs.  This means that the purity and safety of available CBD may be questionable. Most of the time, despite manufacturer's claims, it is contaminated with THC (delta-9-tetrahydrocannabinol - the chemical in marijuana responsible for the "HIGH").  When this is the case, the [facility] contaminant will trigger a positive urine drug screen (UDS) test for Marijuana (carboxy-THC).   The FDA recently put out a warning about 5 things that everyone should be aware of regarding Delta-8 THC: Delta-8 THC products have not been evaluated or approved by the FDA for safe use and may be marketed in ways that put the public health at risk. The FDA has received adverse event reports involving delta-8 THC-containing products. Delta-8 THC has psychoactive and intoxicating effects. Delta-8 THC manufacturing often involve use of potentially harmful chemicals to create the concentrations of delta-8 THC claimed in the marketplace. The final delta-8 THC product may have potentially harmful by-products (contaminants) due to the chemicals used in the process. Manufacturing of delta-8 THC products may occur in uncontrolled or unsanitary settings, which may lead to the presence of unsafe contaminants or other potentially harmful substances. Delta-8 THC products should be kept out of the reach of children and pets.  NOTE: Because a positive UDS for any illicit substance is a violation of our medication agreement, your opioid analgesics (pain medicine) may be permanently discontinued.  MORE ABOUT CBD  General Information: CBD was discovered in [birth year] and it is a derivative of the cannabis sativa genus plants (Marijuana and Hemp). It is one of the 113 identified substances found in Marijuana. It accounts for up to 40% of the plant's extract. As of 2018, preliminary clinical studies on CBD included research for the treatment of anxiety, movement disorders, and pain. CBD is available and consumed in  multiple forms, including inhalation of smoke or vapor, as an aerosol spray, and by mouth. It may be supplied as an oil containing CBD, capsules, dried cannabis, or as a liquid solution. CBD is thought not to be as psychoactive as THC (delta-9-tetrahydrocannabinol - the chemical in marijuana responsible for the "HIGH"). Studies suggest that CBD may interact with different biological target receptors in the body, including cannabinoid and other neurotransmitter receptors. As of 2018 the mechanism of action for its biological effects has not been determined.  Side-effects  Adverse reactions: Dry mouth, diarrhea, decreased appetite, fatigue, drowsiness, malaise, weakness, sleep disturbances, and others.  Drug interactions:  CBD may interact with medications such as blood-thinners. CBD causes drowsiness on its own and it will increase drowsiness caused by other medications, including antihistamines (such as Benadryl), benzodiazepines (Xanax, Ativan, Valium), antipsychotics, antidepressants, opioids, alcohol and supplements such as kava,  melatonin and St. John's Wort.  Other drug interactions: Brivaracetam (Briviact); Caffeine; Carbamazepine (Tegretol); Citalopram (Celexa); Clobazam (Onfi); Eslicarbazepine (Aptiom); Everolimus (Zostress); Lithium; Methadone (Dolophine); Rufinamide (Banzel); Sedative medications (CNS depressants); Sirolimus (Rapamune); Stiripentol (Diacomit); Tacrolimus (Prograf); Tamoxifen ; Soltamox); Topiramate (Topamax); Valproate; Warfarin (Coumadin); Zonisamide. (Last update: 07/22/2022) ____________________________________________________________________________________________   ____________________________________________________________________________________________  Naloxone Nasal Spray  Why am I receiving this medication? [place] STOP ACT requires that all patients taking high dose opioids or at risk of opioids respiratory depression, be prescribed an opioid reversal  agent, such as Naloxone (AKA: Narcan).  What is this medication? NALOXONE (nal OX one) treats opioid overdose, which causes slow or shallow breathing, severe drowsiness, or trouble staying awake. Call emergency services after using this medication. You may need additional treatment. Naloxone works by reversing the effects of opioids. It belongs to a group of medications called opioid blockers.  COMMON BRAND NAME(S): Kloxxado, Narcan  What should I tell my care team before I take this medication? They need to know if you have any of these conditions: Heart disease Substance use disorder An unusual or allergic reaction to naloxone, other medications, foods, dyes, or preservatives Pregnant or trying to get pregnant Breast-feeding  When to use this medication? This medication is to be used for the treatment of respiratory depression (less than 8 breaths per minute) secondary to opioid overdose.   How to use this medication? This medication is for use in the nose. Lay the person on their back. Support their neck with your hand and allow the head to tilt back before giving the medication. The nasal spray should be given into 1 nostril. After giving the medication, move the person onto their side. Do not remove or test the nasal spray until ready to use. Get emergency medical help right away after giving the first dose of this medication, even if the person wakes up. You should be familiar with how to recognize the signs and symptoms of a narcotic overdose. If more doses are needed, give the additional dose in the other nostril. Talk to your care team about the use of this medication in children. While this medication may be prescribed for children as young as newborns for selected conditions, precautions do apply.  Naloxone Overdosage: If you think you have taken too much of this medicine contact a poison control center or emergency room at once.  NOTE: This medicine is only for you. Do not share  this medicine with others.  What if I miss a dose? This does not apply.  What may interact with this medication? This is only used during an emergency. No interactions are expected during emergency use. This list may not describe all possible interactions. Give your health care provider a list of all the medicines, herbs, non-prescription drugs, or dietary supplements you use. Also tell them if you smoke, drink alcohol, or use illegal drugs. Some items may interact with your medicine.  What should I watch for while using this medication? Keep this medication ready for use in the case of an opioid overdose. Make sure that you have the phone number of your care team and local hospital ready. You may need to have additional doses of this medication. Each nasal spray contains a single dose. Some emergencies may require additional doses. After use, bring the treated person to the nearest hospital or call 911. Make sure the treating care team knows that the person has received a dose of this medication. You will receive additional instructions on what to do  during and after use of this medication before an emergency occurs.  What side effects may I notice from receiving this medication? Side effects that you should report to your care team as soon as possible: Allergic reactions--skin rash, itching, hives, swelling of the face, lips, tongue, or throat Side effects that usually do not require medical attention (report these to your care team if they continue or are bothersome): Constipation Dryness or irritation inside the nose Headache Increase in blood pressure Muscle spasms Stuffy nose Toothache This list may not describe all possible side effects. Call your doctor for medical advice about side effects. You may report side effects to FDA at 1-800-FDA-1088.  Where should I keep my medication? Because this is an emergency medication, you should keep it with you at all times.  Keep out of the  reach of children and pets. Store between 20 and 25 degrees C (68 and 77 degrees F). Do not freeze. Throw away any unused medication after the expiration date. Keep in original box until ready to use.  NOTE: This sheet is a summary. It may not cover all possible information. If you have questions about this medicine, talk to your doctor, pharmacist, or health care provider.   2023 Elsevier/Gold Standard (2021-04-06 00:00:00)  ____________________________________________________________________________________________

## 2022-07-29 ENCOUNTER — Other Ambulatory Visit: Payer: Self-pay | Admitting: Internal Medicine

## 2022-07-29 DIAGNOSIS — M1712 Unilateral primary osteoarthritis, left knee: Secondary | ICD-10-CM | POA: Diagnosis not present

## 2022-07-29 DIAGNOSIS — M1711 Unilateral primary osteoarthritis, right knee: Secondary | ICD-10-CM | POA: Diagnosis not present

## 2022-07-29 DIAGNOSIS — M170 Bilateral primary osteoarthritis of knee: Secondary | ICD-10-CM | POA: Diagnosis not present

## 2022-07-30 DIAGNOSIS — J479 Bronchiectasis, uncomplicated: Secondary | ICD-10-CM | POA: Diagnosis not present

## 2022-07-30 DIAGNOSIS — G8929 Other chronic pain: Secondary | ICD-10-CM | POA: Diagnosis not present

## 2022-07-30 DIAGNOSIS — G4700 Insomnia, unspecified: Secondary | ICD-10-CM | POA: Diagnosis not present

## 2022-07-30 DIAGNOSIS — M150 Primary generalized (osteo)arthritis: Secondary | ICD-10-CM | POA: Diagnosis not present

## 2022-07-30 DIAGNOSIS — D508 Other iron deficiency anemias: Secondary | ICD-10-CM | POA: Diagnosis not present

## 2022-07-30 DIAGNOSIS — K219 Gastro-esophageal reflux disease without esophagitis: Secondary | ICD-10-CM | POA: Diagnosis not present

## 2022-07-30 DIAGNOSIS — M810 Age-related osteoporosis without current pathological fracture: Secondary | ICD-10-CM | POA: Diagnosis not present

## 2022-07-30 DIAGNOSIS — I10 Essential (primary) hypertension: Secondary | ICD-10-CM | POA: Diagnosis not present

## 2022-07-31 DIAGNOSIS — U071 COVID-19: Secondary | ICD-10-CM | POA: Diagnosis not present

## 2022-09-09 NOTE — Progress Notes (Unsigned)
PROVIDER NOTE: Interpretation of information contained herein should be left to medically-trained personnel. Specific patient instructions are provided elsewhere under "Patient Instructions" section of medical record. This document was created in part using STT-dictation technology, any transcriptional errors that may result from this process are unintentional.  Patient: Kari Gibbs Type: Established DOB: [birth year] MRN: [id number] PCP: Cari Caraway, MD  Service: Procedure DOS: 09/10/2022 Setting: Ambulatory Location: Ambulatory outpatient facility Delivery: Face-to-face Provider: Gaspar Cola, MD Specialty: Interventional Pain Management Specialty designation: 09 Location: Outpatient facility Ref. Prov.: [name], MD    Primary Reason for Visit: Interventional Pain Management Treatment. CC: No chief complaint on file.    Procedure:          Anesthesia, Analgesia, Anxiolysis:  Type: Diagnostic Posterolateral Intercostal  Nerve Block           Region: Posterolateral  Thoracic Area Level: 8 Ribs Laterality: Left  Anesthesia: Local (1-2% Lidocaine)  Anxiolysis: None  Sedation: None  Guidance: Fluoroscopy           Position: Lateral decubitus, tilted anteriorly (approx. 45 debrees), w/ the painful side up   No diagnosis found. NAS-11 Pain score:   Pre-procedure:  /10   Post-procedure:  /10     Pre-op H&P Assessment:  Kari Gibbs is a 72 y.o. (year old), female patient, seen today for interventional treatment. 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 Kari Gibbs has a current medication list which includes the following prescription(s): acetaminophen, benzonatate, biotin, bupropion, buspirone, vitamin d3, cranberry, diclofenac sodium, esomeprazole, estradiol, eszopiclone, fluoxetine, irbesartan, vitron-c, lansoprazole, loratadine, lorazepam, magnesium, montelukast, morphine, morphine, [START ON 09/29/2022] morphine, naloxone, peppermint oil, systane ultra, potassium gluconate, prenatal mv & Gibbs w/fa-dha, ra probiotic gummies, vitamin b-12, and benefiber. Her primarily concern today is the No chief complaint on file.  Initial Vital Signs:  Pulse/HCG Rate:    Temp:   Resp:   BP:   SpO2:    BMI: Estimated body mass index is 30.32 kg/m as calculated from the following:   Height as of 07/24/22: 5' 5.5" (1.664 m).   Weight as of 07/24/22: 185 lb (83.9 kg).  Risk Assessment: Allergies: Reviewed. She is allergic to iohexol, ace inhibitors, losartan potassium, neosporin [neomycin-bacitracin zn-polymyx], and bacitracin-polymyxin b.  Allergy Precautions: None required Coagulopathies: Reviewed. None identified.  Blood-thinner therapy: None at this time Active Infection(s): Reviewed. None identified. Kari Gibbs is afebrile  Site Confirmation: Kari Gibbs was asked to confirm the procedure and laterality before marking the site Procedure checklist: Completed Consent: Before the procedure and under the influence of no sedative(s), amnesic(s), or anxiolytics, the patient was informed of the treatment options, risks and possible complications. To fulfill our ethical and legal obligations, as recommended by the American Medical Association's Code of Ethics, I have informed the patient of my clinical impression; the nature and purpose of the treatment or procedure; the  risks, benefits, and possible complications of the intervention; the alternatives, including doing nothing; the risk(s) and benefit(s) of the alternative treatment(s) or procedure(s); and the risk(s) and benefit(s) of doing nothing. The patient was provided information  about the general risks and possible complications associated with the procedure. These may include, but are not limited to: failure to achieve desired goals, infection, bleeding, organ or nerve damage, allergic reactions, paralysis, and death. In addition, the patient was informed of those risks and complications associated to the procedure, such as failure to decrease pain; infection; bleeding; organ or nerve damage with subsequent damage to sensory, motor, and/or autonomic systems, resulting in permanent pain, numbness, and/or weakness of one or several areas of the body; allergic reactions; (i.e.: anaphylactic reaction); and/or death. Furthermore, the patient was informed of those risks and complications associated with the medications. These include, but are not limited to: allergic reactions (i.e.: anaphylactic or anaphylactoid reaction(s)); adrenal axis suppression; blood sugar elevation that in diabetics may result in ketoacidosis or comma; water retention that in patients with history of congestive heart failure may result in shortness of breath, pulmonary edema, and decompensation with resultant heart failure; weight gain; swelling or edema; medication-induced neural toxicity; particulate matter embolism and blood vessel occlusion with resultant organ, and/or nervous system infarction; and/or aseptic necrosis of one or more joints. Finally, the patient was informed that Medicine is not an exact science; therefore, there is also the possibility of unforeseen or unpredictable risks and/or possible complications that may result in a catastrophic outcome. The patient indicated having understood very clearly. We have given the patient no  guarantees and we have made no promises. Enough time was given to the patient to ask questions, all of which were answered to the patient's satisfaction. Kari Gibbs has indicated that she wanted to continue with the procedure. Attestation: I, the ordering provider, attest that I have discussed with the patient the benefits, risks, side-effects, alternatives, likelihood of achieving goals, and potential problems during recovery for the procedure that I have provided informed consent. Date  Time: {CHL [facility]-PAIN TIME CHOICES:[id number]}  Pre-Procedure Preparation:  Monitoring: As per clinic protocol. Respiration, ETCO2, SpO2, BP, heart rate and rhythm monitor placed and checked for adequate function Safety Precautions: Patient was assessed for positional comfort and pressure points before starting the procedure. Time-out: I initiated and conducted the "Time-out" before starting the procedure, as per protocol. The patient was asked to participate by confirming the accuracy of the "Time Out" information. Verification of the correct person, site, and procedure were performed and confirmed by me, the nursing staff, and the patient. "Time-out" conducted as per Joint Commission's Universal Protocol (UP.01.01.01). Time:    Description of Procedure:          Target Area: The sub-costal neurovascular bundle area Approach: Sub-costal approach Area Prepped: Entire  ***   Thoracic Region DuraPrep (Iodine Povacrylex [0.7% available iodine] and Isopropyl Alcohol, 74% w/w) Safety Precautions: Aspiration looking for blood return was conducted prior to all injections. At no point did we inject any substances, as a needle was being advanced. No attempts were made at seeking any paresthesias. Safe injection practices and needle disposal techniques used. Medications properly checked for expiration dates. SDV (single dose vial) medications used. Description of the Procedure: Protocol guidelines were followed. The patient  was placed in position over the procedure table. The target area was identified and the area prepped in the usual manner. Skin & deeper tissues infiltrated with local anesthetic. After cleaning the skin with an antiseptic solution, 1-2 mL of dilute local anesthetic was infiltrated subcutaneously at the planned injection site. The fingers of the palpating hand were used to straddle the insertion site at the inferior border of the rib and  fix the skin to avoid unwanted skin movement. Appropriate amount of time allowed to pass for local anesthetics to take effect. The procedure needles were then advanced to the target area at an angle of approximately 20 cephalad to the skin. Contact with the rib was made. While maintaining the same angle of insertion, the needle was walked off the inferior border of the rib as the skin was allowed to return to its initial position. Then the needle was advanced no more than 3 mm below the inferior margin of the rib. Proper needle placement was secured. Negative aspiration confirmed. Following negative aspiration for blood or air, 3-5 mL of local anesthetic was injected. The solution injected in intermittent fashion, asking for systemic symptoms every 0.5cc of injectate. The needle was then removed and the area cleansed, making sure to leave some of the prepping solution back to take advantage of its long term bactericidal properties. There were no vitals filed for this visit.  Start Time:   hrs. End Time:   hrs.   Materials:  Needle(s) Type: Regular needle Gauge: 25G Length: 1.5-in Medication(s): Please see orders for medications and dosing details.  Imaging Guidance (Non-Spinal):          Type of Imaging Technique: Fluoroscopy Guidance (Non-Spinal) Indication(s): Assistance in needle guidance and placement for procedures requiring needle placement in or near specific anatomical locations not easily accessible without such assistance. Exposure Time: Please see nurses  notes. Contrast: None used. Fluoroscopic Guidance: I was personally present during the use of fluoroscopy. "Tunnel Vision Technique" used to obtain the best possible view of the target area. Parallax error corrected before commencing the procedure. "Direction-depth-direction" technique used to introduce the needle under continuous pulsed fluoroscopy. Once target was reached, antero-posterior, oblique, and lateral fluoroscopic projection used confirm needle placement in all planes. Images permanently stored in EMR. Interpretation: No contrast injected. I personally interpreted the imaging intraoperatively. Adequate needle placement confirmed in multiple planes. Permanent images saved into the patient's record.  Antibiotic Prophylaxis:   Anti-infectives (From admission, onward)    None      Indication(s): None identified  Post-operative Assessment:  Post-procedure Vital Signs:  Pulse/HCG Rate:    Temp:   Resp:   BP:   SpO2:    EBL: None  Complications: No immediate post-treatment complications observed by team, or reported by patient.  Note: The patient tolerated the entire procedure well. A repeat set of vitals were taken after the procedure and the patient was kept under observation following institutional policy, for this type of procedure. Post-procedural neurological assessment was performed, showing return to baseline, prior to discharge. The patient was provided with post-procedure discharge instructions, including a section on how to identify potential problems. Should any problems arise concerning this procedure, the patient was given instructions to immediately contact us, at any time, without hesitation. In any case, we plan to contact the patient by telephone for a follow-up status report regarding this interventional procedure.  Comments:  No additional relevant information.  Plan of Care  Orders:  No orders of the defined types were placed in this encounter.  Chronic  Opioid Analgesic:  MS IR 15 mg mg, 1 tab PO q 8 hrs (45 mg/day of morphine) MME/day: 45 mg/day.   Medications ordered for procedure: No orders of the defined types were placed in this encounter.  Medications administered: Micala Saltsman had no medications administered during this visit.  See the medical record for exact dosing, route, and time of administration.  Follow-up plan:  No follow-ups on file.       Interventional Therapies  Risk  Complexity Considerations:   Estimated body mass index is 32.45 kg/m as calculated from the following:   Height as of this encounter: '5\' 5"'$  (1.651 m).   Weight as of this encounter: 195 lb (88.5 kg). NOTE: CONTRAST ALLERGY (Anaphylaxis)   Planned  Pending:   Diagnostic/therapeutic left eighth rib intercostal NB    Under consideration:   Diagnostic left costochondral joint injection  Diagnostic left C2 + TON NB    Completed:   Palliative left intercostal NB (T5 to T8) x1 (03/03/2017)  Palliative left GONB Blk x2 (07/17/2016)  Palliative bilateral SI Blk x1 (04/02/2018)    Therapeutic  Palliative (PRN) options:   Palliative left intercostal NB (T5 to T8) #2  Palliative left GONB Blk #3  Palliative bilateral SI Blk #2    Pharmacotherapy:  Nonopioids transferred 05/29/2020: Gabapentin and Voltaren gel Recommendations:   None at this time.       Recent Visits Date Type Provider Dept  07/24/22 Office Visit [name], MD [facility]  Showing recent visits within past 90 days and meeting all other requirements Future Appointments Date Type Provider Dept  09/10/22 Appointment [name], [facility]  10/23/22 Appointment [name], MD [facility]  Showing future appointments within next 90 days and meeting all other requirements  Disposition: Discharge home  Discharge (Date  Time): 09/10/2022;   hrs.   Primary Care Physician: Cari Caraway, MD Location: [facility] Note by: Gaspar Cola, MD Date: 09/10/2022; Time: 11:27 AM  Disclaimer:  Medicine is not an Chief Strategy Officer. The only guarantee in medicine is that nothing is guaranteed. It is important to note that the decision to proceed with this intervention was based on the information collected from the patient. The Data and conclusions were drawn from the patient's questionnaire, the interview, and the physical examination. Because the information was provided in large part by the patient, it cannot be guaranteed that it has not been purposely or unconsciously manipulated. Every effort has been made to obtain as much relevant data as possible for this evaluation. It is important to note that the conclusions that lead to this procedure are derived in large part from the available data. Always take into account that the treatment will also be dependent on availability of resources and existing treatment guidelines, considered by other Pain Management Practitioners as being common knowledge and practice, at the time of the intervention. For Medico-Legal purposes, it is also important to point out that variation in procedural techniques and pharmacological choices are the acceptable norm. The indications, contraindications, technique, and results of the above procedure should only be interpreted and judged by a Board-Certified Interventional Pain Specialist with extensive familiarity and expertise in the same exact procedure and technique.

## 2022-09-10 ENCOUNTER — Encounter: Payer: Self-pay | Admitting: Pain Medicine

## 2022-09-10 ENCOUNTER — Ambulatory Visit: Payer: Medicare Other | Attending: Pain Medicine | Admitting: Pain Medicine

## 2022-09-10 ENCOUNTER — Ambulatory Visit
Admission: RE | Admit: 2022-09-10 | Discharge: 2022-09-10 | Disposition: A | Discharge status: Home / Self Care | Payer: Medicare Other | Source: Ambulatory Visit | Attending: Pain Medicine | Admitting: Pain Medicine

## 2022-09-10 VITALS — BP 126/56 | HR 72 | Temp 97.9°F | Resp 16 | Ht 65.5 in | Wt 183.0 lb

## 2022-09-10 DIAGNOSIS — Z8781 Personal history of (healed) traumatic fracture: Secondary | ICD-10-CM | POA: Insufficient documentation

## 2022-09-10 DIAGNOSIS — R1012 Left upper quadrant pain: Secondary | ICD-10-CM | POA: Diagnosis not present

## 2022-09-10 DIAGNOSIS — S2232XS Fracture of one rib, left side, sequela: Secondary | ICD-10-CM | POA: Insufficient documentation

## 2022-09-10 DIAGNOSIS — S2232XA Fracture of one rib, left side, initial encounter for closed fracture: Secondary | ICD-10-CM | POA: Insufficient documentation

## 2022-09-10 DIAGNOSIS — Z91041 Radiographic dye allergy status: Secondary | ICD-10-CM | POA: Insufficient documentation

## 2022-09-10 DIAGNOSIS — G8929 Other chronic pain: Secondary | ICD-10-CM | POA: Insufficient documentation

## 2022-09-10 DIAGNOSIS — G588 Other specified mononeuropathies: Secondary | ICD-10-CM | POA: Insufficient documentation

## 2022-09-10 DIAGNOSIS — M5414 Radiculopathy, thoracic region: Secondary | ICD-10-CM | POA: Diagnosis not present

## 2022-09-10 DIAGNOSIS — R937 Abnormal findings on diagnostic imaging of other parts of musculoskeletal system: Secondary | ICD-10-CM | POA: Diagnosis not present

## 2022-09-10 DIAGNOSIS — Z87892 Personal history of anaphylaxis: Secondary | ICD-10-CM | POA: Insufficient documentation

## 2022-09-10 DIAGNOSIS — R0781 Pleurodynia: Secondary | ICD-10-CM | POA: Diagnosis not present

## 2022-09-10 DIAGNOSIS — R109 Unspecified abdominal pain: Secondary | ICD-10-CM | POA: Diagnosis not present

## 2022-09-10 MED ORDER — DEXAMETHASONE SODIUM PHOSPHATE 10 MG/ML IJ SOLN
INTRAMUSCULAR | Status: AC
  Filled 2022-09-10: qty 1

## 2022-09-10 MED ORDER — MIDAZOLAM HCL 2 MG/2ML IJ SOLN
INTRAMUSCULAR | Status: AC
  Filled 2022-09-10: qty 2

## 2022-09-10 MED ORDER — MIDAZOLAM HCL 2 MG/2ML IJ SOLN
0.5000 mg | Freq: Once | INTRAMUSCULAR | Status: AC
  Administered 2022-09-10: 2 mg via INTRAVENOUS

## 2022-09-10 MED ORDER — ROPIVACAINE HCL 2 MG/ML IJ SOLN
INTRAMUSCULAR | Status: AC
  Filled 2022-09-10: qty 20

## 2022-09-10 MED ORDER — PENTAFLUOROPROP-TETRAFLUOROETH EX AERO
INHALATION_SPRAY | Freq: Once | CUTANEOUS | Status: AC
  Administered 2022-09-10: 30 via TOPICAL
  Filled 2022-09-10: qty 116

## 2022-09-10 MED ORDER — ROPIVACAINE HCL 2 MG/ML IJ SOLN
9.0000 mL | Freq: Once | INTRAMUSCULAR | Status: AC
  Administered 2022-09-10: 9 mL via PERINEURAL

## 2022-09-10 MED ORDER — LACTATED RINGERS IV SOLN: Freq: Once | INTRAVENOUS | Status: AC

## 2022-09-10 MED ORDER — LIDOCAINE HCL 2 % IJ SOLN
INTRAMUSCULAR | Status: AC
  Filled 2022-09-10: qty 20

## 2022-09-10 MED ORDER — LIDOCAINE HCL 2 % IJ SOLN
20.0000 mL | Freq: Once | INTRAMUSCULAR | Status: AC
  Administered 2022-09-10: 400 mg

## 2022-09-10 MED ORDER — DEXAMETHASONE SODIUM PHOSPHATE 10 MG/ML IJ SOLN
10.0000 mg | Freq: Once | INTRAMUSCULAR | Status: AC
  Administered 2022-09-10: 10 mg

## 2022-09-10 NOTE — Patient Instructions (Addendum)
Post-procedure Information What to expect: Most procedures involve the use of a local anesthetic (numbing medicine), and a steroid (anti-inflammatory medicine).  The local anesthetics may cause temporary numbness and weakness of the legs or arms, depending on the location of the block. This numbness/weakness may last 4-6 hours, depending on the local anesthetic used. In rare instances, it can last up to 24 hours. While numb, you must be very careful not to injure the extremity.  After any procedure, you could expect the pain to get better within 15-20 minutes. This relief is temporary and may last 4-6 hours. Once the local anesthetics wears off, you could experience discomfort, possibly more than usual, for up to 10 (ten) days. In the case of radiofrequencies, it may last up to 6 weeks. Surgeries may take up to 8 weeks for the healing process. The discomfort is due to the irritation caused by needles going through skin and muscle. To minimize the discomfort, we recommend using ice the first day, and heat from then on. The ice should be applied for 15 minutes on, and 15 minutes off. Keep repeating this cycle until bedtime. Avoid applying the ice directly to the skin, to prevent frostbite. Heat should be used daily, until the pain improves (4-10 days). Be careful not to burn yourself.  Occasionally you may experience muscle spasms or cramps. These occur as a consequence of the irritation caused by the needle sticks to the muscle and the blood that will inevitably be lost into the surrounding muscle tissue. Blood tends to be very irritating to tissues, which tend to react by going into spasm. These spasms may start the same day of your procedure, but they may also take days to develop. This late onset type of spasm or cramp is usually caused by electrolyte imbalances triggered by the steroids, at the level of the kidney. Cramps and spasms tend to respond well to muscle relaxants, multivitamins (some are  triggered by the procedure, but may have their origins in vitamin deficiencies), and "Gatorade", or any sports drinks that can replenish any electrolyte imbalances. (If you are a diabetic, ask your pharmacist to get you a sugar-free brand.) Warm showers or baths may also be helpful. Stretching exercises are highly recommended. General Instructions:  Be alert for signs of possible infection: redness, swelling, heat, red streaks, elevated temperature, and/or fever. These typically appear 4 to 6 days after the procedure. Immediately notify your doctor if you experience unusual bleeding, difficulty breathing, or loss of bowel or bladder control. If you experience increased pain, do not increase your pain medicine intake, unless instructed by your pain physician. Post-Procedure Care:  Be careful in moving about. Muscle spasms in the area of the injection may occur. Applying ice or heat to the area is often helpful. The incidence of spinal headaches after epidural injections ranges between 1.4% and 6%. If you develop a headache that does not seem to respond to conservative therapy, please let your physician know. This can be treated with an epidural blood patch.   Post-procedure numbness or redness is to be expected, however it should average 4 to 6 hours. If numbness and weakness of your extremities begins to develop 4 to 6 hours after your procedure, and is felt to be progressing and worsening, immediately contact your physician.   Diet:  If you experience nausea, do not eat until this sensation goes away. If you had a "Stellate Ganglion Block" for upper extremity "Reflex Sympathetic Dystrophy", do not eat or drink until your  hoarseness goes away. In any case, always start with liquids first and if you tolerate them well, then slowly progress to more solid foods. Activity:  For the first 4 to 6 hours after the procedure, use caution in moving about as you may experience numbness and/or weakness. Use caution in  cooking, using household electrical appliances, and climbing steps. If you need to reach your Doctor call our office: [phone] Monday-Thursday 8:00 am - 4:00 PM    Fridays: Closed     In case of an emergency: In case of emergency, call 911 or go to the nearest emergency room and have the physician there call us.  Interpretation of Procedure Every nerve block has two components: a diagnostic component, and a treatment component. Unrealistic expectations are the most common causes of "perceived failure".  In a perfect world, a single nerve block should be able to completely and permanently eliminate the pain. Sadly, the world is not perfect.  Most pain management nerve blocks are performed using local anesthetics and steroids. Steroids are responsible for any long-term benefit that you may experience. Their purpose is to decrease any chronic swelling that may exist in the area. Steroids begin to work immediately after being injected. However, most patients will not experience any benefits until 5 to 10 days after the injection, when the swelling has come down to the point where they can tell a difference. Steroids will only help if there is swelling to be treated. As such, they can assist with the diagnosis. If effective, they suggest an inflammatory component to the pain, and if ineffective, they rule out inflammation as the main cause or component of the problem. If the problem is one of mechanical compression, you will get no benefit from those steroids.   In the case of local anesthetics, they have a crucial role in the diagnosis of your condition. Most will begin to work within15 to 20 minutes after injection. The duration will depend on the type used (short- vs. Long-acting). It is of outmost importance that patients keep tract of their pain, after the procedure. To assist with this matter, a "Post-procedure Pain Diary" is provided. Make sure to complete it and to bring it back to your  follow-up appointment.  As long as the patient keeps accurate, detailed records of their symptoms after every procedure, and returns to have those interpreted, every procedure will provide [place] with invaluable information. Even a block that does not provide the patient with any relief, will always provide [place] with information about the mechanism and the origin of the pain. The only time a nerve block can be considered a waste of time is when patients do not keep track of the results, or do not keep their post-procedure appointment.  Reporting the results back to your physician The Pain Score  Pain is a subjective complaint. It cannot be seen, touched, or measured. We depend entirely on the patient's report of the pain in order to assess your condition and treatment. To evaluate the pain, we use a pain scale, where "0" means "No Pain", and a "10" is "the worst possible pain that you can even imagine" (i.e. something like been eaten alive by a shark or being torn apart by a lion).   You will frequently be asked to rate your pain. Please be as accurate, remember that medical decisions will be based on your responses. Please do not rate your pain above a 10. Doing so is actually interpreted as "symptom magnification" (exaggeration), as  well as lack of understanding with regards to the scale. To put this into perspective, when you tell us that your pain is at a 10 (ten), what you are saying is that there is nothing we can do to make this pain any worse. (Carefully think about that.)  ____________________________________________________________________________________________  Virtual Visits   ID our calls: Add these numbers to your list of contacts on your smart phone. Label it as "PAIN Management" Nursing: [phone] (Main) Dr. Dossie Arbour: [phone]   What is a "Virtual Visit"? It is an Automotive engineer (medical visit) that takes place on real time (NOT TEXT or E-MAIL) over the  telephone or computer device (desktop, laptop, tablet, smart phone, etc.). It allows for more communication flexibility between the patient and the healthcare provider.  Who decides when these types of visits will be used? The physician.  Who is eligible for these types of visits? Only those patients that can be reliably reached over the telephone.  What do you mean by reliably? We do not have time to call everyone multiple times, therefore those patients that tend to screen calls and then call back later are not suitable candidates for this system. We all hate telemarketers and "Robocalls".  We understand how people are reluctant to pickup on calls from "unknown numbers", therefore, we suggest you add our numbers to your list of contacts. This way, you should be able to identify our calls. All of our numbers are available above.   Who is not eligible? This option is not appropriate for medication management. Patients on controlled substances have to come in for "Face-to-Face" encounters. Monitoring of these substances is mandatory. Virtual visits do not allow for unannounced drug screening tests or pill counts. Not bringing your pills, or the empty bottles, may result in no refill.  When will this type of visits be used? For follow-up after procedures on established patients, as long as they have had the same procedure done before.  Whenever you are physically unable to attend a regular appointment.   Can I request my medication visit to be "Virtual"? Yes. Available on a limited basis, only if you are unable to physically attend your appointment. However, you may only receive a 30-day prescription. Abuse of this option may result in discontinuation of medication due to inability to properly monitor the therapy.  When will I be called?  You will receive an initial call from [phone], from our nursing staff, one business day prior to your appointment. (For Monday appointments you will be  called on Friday.) The purpose of this call is to review your medications and the results of any recent procedure.  If the nursing staff is unable to make contact, your virtual encounter may be canceled and rescheduled to a face-to-face visit.  Your provider will call you on the day of your appointment.  At what time will I be called? Providers will call whenever there is time available. Do not expect calls at any specific time. On the schedule, you will have an appointment time assigned to you however, this is seldom accurate. This is done simply to keep a list of patients to be called. Be advised that calls may come at anytime during the day. Calls start as early as 8:00 AM and go as late as 8:00 PM. This will depend on provider availability. The system is not perfect. If this is inconvenient for you, please request to be changed to an "in-person" appointment.   ____________________________________________________________________________________________  ____________________________________________________________________________________________  Post-Procedure Discharge Instructions  Instructions: Apply ice:  Purpose: This will minimize any swelling and discomfort after procedure.  When: Day of procedure, as soon as you get home. How: Fill a plastic sandwich bag with crushed ice. Cover it with a small towel and apply to injection site. How long: (15 min on, 15 min off) Apply for 15 minutes then remove x 15 minutes.  Repeat sequence on day of procedure, until you go to bed. Apply heat:  Purpose: To treat any soreness and discomfort from the procedure. When: Starting the next day after the procedure. How: Apply heat to procedure site starting the day following the procedure. How long: May continue to repeat daily, until discomfort goes away. Food intake: Start with clear liquids (like water) and advance to regular food, as tolerated.  Physical activities: Keep activities to a minimum for  the first 8 hours after the procedure. After that, then as tolerated. Driving: If you have received any sedation, be responsible and do not drive. You are not allowed to drive for 24 hours after having sedation. Blood thinner: (Applies only to those taking blood thinners) You may restart your blood thinner 6 hours after your procedure. Insulin: (Applies only to Diabetic patients taking insulin) As soon as you can eat, you may resume your normal dosing schedule. Infection prevention: Keep procedure site clean and dry. Shower daily and clean area with soap and water. Post-procedure Pain Diary: Extremely important that this be done correctly and accurately. Recorded information will be used to determine the next step in treatment. For the purpose of accuracy, follow these rules: Evaluate only the area treated. Do not report or include pain from an untreated area. For the purpose of this evaluation, ignore all other areas of pain, except for the treated area. After your procedure, avoid taking a long nap and attempting to complete the pain diary after you wake up. Instead, set your alarm clock to go off every hour, on the hour, for the initial 8 hours after the procedure. Document the duration of the numbing medicine, and the relief you are getting from it. Do not go to sleep and attempt to complete it later. It will not be accurate. If you received sedation, it is likely that you were given a medication that may cause amnesia. Because of this, completing the diary at a later time may cause the information to be inaccurate. This information is needed to plan your care. Follow-up appointment: Keep your post-procedure follow-up evaluation appointment after the procedure (usually 2 weeks for most procedures, 6 weeks for radiofrequencies). DO NOT FORGET to bring you pain diary with you.   Expect: (What should I expect to see with my procedure?) From numbing medicine (AKA: Local Anesthetics): Numbness or decrease  in pain. You may also experience some weakness, which if present, could last for the duration of the local anesthetic. Onset: Full effect within 15 minutes of injected. Duration: It will depend on the type of local anesthetic used. On the average, 1 to 8 hours.  From steroids (Applies only if steroids were used): Decrease in swelling or inflammation. Once inflammation is improved, relief of the pain will follow. Onset of benefits: Depends on the amount of swelling present. The more swelling, the longer it will take for the benefits to be seen. In some cases, up to 10 days. Duration: Steroids will stay in the system x 2 weeks. Duration of benefits will depend on multiple posibilities including persistent irritating factors. Side-effects: If present,  they may typically last 2 weeks (the duration of the steroids). Frequent: Cramps (if they occur, drink Gatorade and take over-the-counter Magnesium 450-500 mg once to twice a day); water retention with temporary weight gain; increases in blood sugar; decreased immune system response; increased appetite. Occasional: Facial flushing (red, warm cheeks); mood swings; menstrual changes. Uncommon: Long-term decrease or suppression of natural hormones; bone thinning. (These are more common with higher doses or more frequent use. This is why we prefer that our patients avoid having any injection therapies in other practices.)  Very Rare: Severe mood changes; psychosis; aseptic necrosis. From procedure: Some discomfort is to be expected once the numbing medicine wears off. This should be minimal if ice and heat are applied as instructed.  Call if: (When should I call?) You experience numbness and weakness that gets worse with time, as opposed to wearing off. New onset bowel or bladder incontinence. (Applies only to procedures done in the spine)  Emergency Numbers: Durning business hours (Monday - Thursday, 8:00 AM - 4:00 PM) (Friday, 9:00 AM - 12:00 Noon): [phone]  [phone] After hours: [phone] NOTE: If you are having a problem and are unable connect with, or to talk to a provider, then go to your nearest urgent care or emergency department. If the problem is serious and urgent, please call 911. ____________________________________________________________________________________________

## 2022-09-11 ENCOUNTER — Telehealth: Payer: Self-pay

## 2022-09-11 NOTE — Telephone Encounter (Signed)
Post procedure follow up.  Patient states she is doing pretty good.

## 2022-09-17 DIAGNOSIS — R7309 Other abnormal glucose: Secondary | ICD-10-CM | POA: Diagnosis not present

## 2022-09-17 DIAGNOSIS — R7303 Prediabetes: Secondary | ICD-10-CM | POA: Diagnosis not present

## 2022-09-17 DIAGNOSIS — K911 Postgastric surgery syndromes: Secondary | ICD-10-CM | POA: Diagnosis not present

## 2022-09-26 ENCOUNTER — Telehealth: Payer: Medicare Other | Admitting: Pain Medicine

## 2022-09-26 DIAGNOSIS — M1712 Unilateral primary osteoarthritis, left knee: Secondary | ICD-10-CM | POA: Diagnosis not present

## 2022-09-26 DIAGNOSIS — M1711 Unilateral primary osteoarthritis, right knee: Secondary | ICD-10-CM | POA: Diagnosis not present

## 2022-09-26 DIAGNOSIS — M170 Bilateral primary osteoarthritis of knee: Secondary | ICD-10-CM | POA: Diagnosis not present

## 2022-10-02 ENCOUNTER — Encounter: Payer: Self-pay | Admitting: Pain Medicine

## 2022-10-02 NOTE — Progress Notes (Unsigned)
Patient: Kari Gibbs  Service Category: E/M  Provider: Gaspar Cola, MD  DOB: 10/30/1950  DOS: 10/03/2022  Location: Office  MRN: [id number]  Setting: Ambulatory outpatient  Referring Provider: Cari Caraway, MD  Type: Established Patient  Specialty: Interventional Pain Management  PCP: Kari Caraway, MD  Location: Remote location  Delivery: TeleHealth     Virtual Encounter - Pain Management PROVIDER NOTE: Information contained herein reflects review and annotations entered in association with encounter. Interpretation of such information and data should be left to medically-trained personnel. Information provided to patient can be located elsewhere in the medical record under "Patient Instructions". Document created using STT-dictation technology, any transcriptional errors that may result from process are unintentional.    Contact & Pharmacy Preferred: [phone] Home: [phone] (home) Mobile: [phone] (mobile) E-mail: [email or web address]  [id number] [place], [place] - [address]Phone: [phone]: [phone] [facility] ([facility] [facility] [place]Phone: [phone]: [address] [address]Phone: [phone]: [phone]  Pre-screening  [name] "in-person" vs "virtual" encounter. She indicated preferring virtual for this encounter.   Reason COVID-19*  Social distancing based on CDC and AMA recommendations.   I contacted STryna Crowon 10/03/2022 via telephone.      I clearly identified myself as FGaspar Cola MD. I verified that I was speaking with the correct person using two identifiers (Name: Kari Gibbs and  date of birth: [birth year].  Consent I sought verbal advanced consent from Kari Boxfor virtual visit interactions. I informed Ms. YSeguinof possible security and privacy concerns, risks, and limitations associated with providing "not-in-person" medical evaluation and management services. I also informed Ms. YHayashiof the availability of "in-person" appointments. Finally, I informed her that there would be a charge for the virtual visit and that she could be  personally, fully or partially, financially responsible for it. Kari Gibbs understanding and agreed to proceed.   Historic Elements   Kari Gibbs a 72y.o. year old, female patient evaluated today after our last contact on 09/10/2022. 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 Ms. YEnsinghas a current medication list which  includes the following prescription(s): acetaminophen, benzonatate, biotin, bupropion, buspirone, vitamin d3, cranberry, diclofenac sodium, esomeprazole, estradiol, eszopiclone, fluoxetine, irbesartan, vitron-c, lansoprazole, loratadine, lorazepam, magnesium, montelukast, morphine, naloxone, peppermint oil, systane ultra, potassium gluconate, prenatal mv & min w/fa-dha, ra probiotic gummies, vitamin b-12, benefiber,  morphine, and morphine. She  reports that she has never smoked. She has never used smokeless tobacco. She reports current alcohol use. She reports that she does not use drugs. Kari Gibbs, ace inhibitors, losartan potassium, neosporin [neomycin-bacitracin zn-polymyx], and bacitracin-polymyxin b.  Estimated body mass index is 29.99 kg/m as calculated from the following:   Height as of 09/10/22: 5' 5.5" (1.664 m).   Weight as of 09/10/22: 183 lb (83 kg).  HPI  Today, she is being contacted for a post-procedure assessment.  The patient indicates having attained 100% relief of the pain for the duration of the local anesthetic which after 3 days it dropped down to 50% and she refers that for the last 4 days it has gone back up to baseline.  Today we had a conversation with regards to the next possible step.  I informed her that we could repeat the steroid injection, or we could consider a neurolytic block off the intercostal nerves.  I explained to her about the possibility of a neuritis with either chemical or thermal neurolysis.  The patient indicated that she would like to think about her choices and she will be doing some research on the Internet.  I told her to let me know if she has any questions.  Post-procedure evaluation    Procedure:          Anesthesia, Analgesia, Anxiolysis:  Type: Diagnostic Posterolateral 8th Intercostal  Nerve Block #1  Region: Posterolateral  Thoracic Area Level: 8th Rib Laterality: Left  Anesthesia: Local (1-2% Lidocaine)  Anxiolysis:  None  Sedation: None  Guidance: Fluoroscopy           Position: Lateral decubitus, tilted anteriorly (approx. 45 debrees), w/ the painful side up   1. 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 Rib pain (Left)   3. Fracture of rib (4th and 8th rib) (Left)   4. History of rib fracture (4th and 8th rib) (Left)   5. Chronic flank pain (1ry area of Pain) (Left)   6. Left upper quadrant pain   7. Chronic thoracic radicular pain (Left-sided)   8. History Allergy to Contrast   9. History of drug-induced anaphylaxis    NAS-11 Pain score:   Pre-procedure: 5 /10   Post-procedure: 0-No pain/10     Effectiveness:  Initial hour after procedure: 100 %. Subsequent 4-6 hours post-procedure: 100 %. Analgesia past initial 6 hours: 50 %. Ongoing improvement:  Analgesic: She refers that for the past 4 days he has gone back to baseline. Function: Back to baseline ROM: Back to baseline   Pharmacotherapy Assessment   Opioid Analgesic: MS IR 15 mg mg, 1 tab PO q 8 hrs (45 mg/day of morphine) MME/day: 45 mg/day.   Monitoring: [place] PMP: PDMP reviewed during this encounter.       Pharmacotherapy: No side-effects or adverse reactions reported. Compliance: No problems identified. Effectiveness: Clinically acceptable. Plan: Refer to "POC". UDS:  Summary  Date Value Ref Range Status  01/23/2022 Note  Final    Comment:    ==================================================================== [employer] Select 13 (MW) ==================================================================== Test                             Result       Flag       Units  Drug Present and Declared for Prescription Verification   Lorazepam                      >  1290        EXPECTED   ng/mg creat    Source of lorazepam is a scheduled prescription medication.    Morphine                       >6452        EXPECTED   ng/mg creat   Normorphine                    491          EXPECTED   ng/mg creat    Potential sources of  large amounts of morphine in the absence of    codeine include administration of morphine or use of heroin.     Normorphine is an expected metabolite of morphine.    Hydromorphone                  284          EXPECTED   ng/mg creat    Hydromorphone may be administered as a scheduled prescription    medication and is also an expected metabolite of hydrocodone.    Hydromorphone is also a minor metabolite of morphine which is also    present in this specimen. Concentrations of hydromorphone rarely    exceed 5% of the morphine concentration when metabolism of morphine    is the sole source of hydromorphone.  Drug Absent but Declared for Prescription Verification   Hydrocodone                    Not Detected UNEXPECTED ng/mg creat    Hydrocodone is almost always present in patients taking this drug    consistently. Absence of hydrocodone could be due to lapse of time    since the last dose or unusual pharmacokinetics (rapid metabolism).  ==================================================================== Test                      Result    Flag   Units      Ref Range   Creatinine              155              mg/dL      >=20 ==================================================================== Declared Medications:  The flagging and interpretation on this report are based on the  following declared medications.  Unexpected results may arise from  inaccuracies in the declared medications.   **Note: The testing scope of this panel includes these medications:   Hydrocodone (Norco)  Lorazepam (Ativan)  Morphine (MSIR)   **Note: The testing scope of this panel does not include the  following reported medications:   Acetaminophen (Tylenol)  Acetaminophen (Norco)  Biotin  Bupropion (Wellbutrin XL)  Buspirone (Buspar)  Cholecalciferol  Cranberry  Cyanocobalamin  Diclofenac (Voltaren)  Doxepin (Sinequan)  Estradiol (Estrace)  Eye Drop  Fluoxetine (Prozac)  Folic Acid  Irbesartan  (Avapro)  Iron  Loratadine (Claritin)  Magnesium  Montelukast  Multivitamin  Naloxone (Narcan)  Potassium  Probiotic  Supplement  Vitamin C ==================================================================== For clinical consultation, please call [phone]. ====================================================================    No results found for: "CBDTHCR", "D8THCCBX", "D9THCCBX"   Laboratory Chemistry Profile   Renal Lab Results  Component Value Date   BUN 19 11/15/2021   CREATININE 0.90 11/15/2021   GFRAA >60 01/04/2016   GFRNONAA >60 11/15/2021    Hepatic Lab Results  Component Value Date   AST 29 04/04/2021  ALT 30 04/04/2021   ALBUMIN 4.6 04/04/2021   ALKPHOS 89 04/04/2021   AMYLASE 30 02/27/2010   LIPASE 22 10/12/2012    Electrolytes Lab Results  Component Value Date   NA 140 11/15/2021   K 5.2 (H) 11/15/2021   CL 105 11/15/2021   CALCIUM 9.4 11/15/2021   MG 2.1 01/04/2016    Bone Lab Results  Component Value Date   25OHVITD1 49 01/04/2016   25OHVITD2 25 01/04/2016   25OHVITD3 24 01/04/2016    Inflammation (CRP: Acute Phase) (ESR: Chronic Phase) Lab Results  Component Value Date   CRP <0.5 01/04/2016   ESRSEDRATE 17 01/04/2016         Note: Above Lab results reviewed.  Imaging  DG PAIN CLINIC C-ARM 1-60 MIN NO REPORT Fluoro was used, but no Radiologist interpretation will be provided.  Please refer to "NOTES" tab for provider progress note.  Assessment  The primary encounter diagnosis was Rib pain (Left). 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  Plan of Care  Problem-specific:  No problem-specific Assessment & Plan notes found for this encounter.  [name] has a current medication list which includes the following long-term  medication(s): bupropion, diclofenac sodium, eszopiclone, fluoxetine, lansoprazole, morphine, morphine, and morphine.  Pharmacotherapy (Medications Ordered): No orders of the defined types were placed in this encounter.  Orders:  No orders of the defined types were placed in this encounter.  Follow-up plan:   No follow-ups on file.      Interventional Therapies  Risk  Complexity Considerations:   NOTE: CONTRAST ALLERGY (Anaphylaxis)   Planned  Pending:      Under consideration:   Diagnostic left costochondral joint injection  Diagnostic left C2 + TON NB    Completed:   Therapeutic left 8th rib intercostal NB x1 (09/10/2022)  Palliative left intercostal NB (T5 to T8) x1 (03/03/2017)  Palliative left GONB Blk x2 (07/17/2016)  Palliative bilateral SI Blk x1 (04/02/2018)    Therapeutic  Palliative (PRN) options:   Palliative left intercostal NB (T5 to T8) #2  Palliative left GONB Blk #3  Palliative bilateral SI Blk #2    Pharmacotherapy  Nonopioids transferred 05/29/2020: Gabapentin and Voltaren gel       Recent Visits Date Type Provider Dept  09/10/22 Procedure visit [name], MD [facility]  07/24/22 Office Visit [name], MD [facility]  Showing recent visits within past 90 days and meeting all other requirements Today's Visits Date Type Provider Dept  10/03/22 Office Visit [name], MD [facility]  Showing today's visits and meeting all other requirements Future Appointments Date Type Provider Dept  10/23/22 Appointment [name], MD [facility]  Showing future appointments within next 90 days and meeting all other requirements  I discussed the assessment and treatment plan with the patient. The patient was provided an opportunity to ask questions and all were answered. The patient agreed with the plan and demonstrated an understanding of the instructions.  Patient advised to call  back or seek an in-person evaluation if the symptoms or condition worsens.  Duration of encounter: 18 minutes.  Note by: Gaspar Cola, MD Date: 10/03/2022; Time: 12:15 PM

## 2022-10-03 ENCOUNTER — Ambulatory Visit: Payer: Medicare Other | Attending: Pain Medicine | Admitting: Pain Medicine

## 2022-10-03 DIAGNOSIS — M5414 Radiculopathy, thoracic region: Secondary | ICD-10-CM | POA: Diagnosis not present

## 2022-10-03 DIAGNOSIS — G894 Chronic pain syndrome: Secondary | ICD-10-CM | POA: Diagnosis not present

## 2022-10-03 DIAGNOSIS — G8929 Other chronic pain: Secondary | ICD-10-CM

## 2022-10-03 DIAGNOSIS — R0781 Pleurodynia: Secondary | ICD-10-CM | POA: Diagnosis not present

## 2022-10-03 DIAGNOSIS — R1012 Left upper quadrant pain: Secondary | ICD-10-CM | POA: Diagnosis not present

## 2022-10-03 DIAGNOSIS — G588 Other specified mononeuropathies: Secondary | ICD-10-CM

## 2022-10-14 ENCOUNTER — Telehealth: Payer: Self-pay | Admitting: Pain Medicine

## 2022-10-14 NOTE — Telephone Encounter (Signed)
PT stated that DR told her to call back about a PRN order for RFA.

## 2022-10-21 NOTE — Progress Notes (Unsigned)
PROVIDER NOTE: Information contained herein reflects review and annotations entered in association with encounter. Interpretation of such information and data should be left to medically-trained personnel. Information provided to patient can be located elsewhere in the medical record under "Patient Instructions". Document created using STT-dictation technology, any transcriptional errors that may result from process are unintentional.    Patient: Kari Gibbs  Service Category: E/M  Provider: Gaspar Cola, MD  DOB: 1951/02/28  DOS: 10/23/2022  Referring Provider: Cari Caraway, MD  MRN: [id number]  Specialty: Interventional Pain Management  PCP: Cari Caraway, MD  Type: Established Patient  Setting: Ambulatory outpatient    Location: Office  Delivery: Face-to-face     HPI  Kari Gibbs, a 72 y.o. year old female, is here today because of her No primary diagnosis found.. Kari Gibbs primary complain today is No chief complaint on file.  Pertinent problems: Kari Gibbs has Chronic pain syndrome; Chronic flank pain (1ry area of Pain) (Left); Chronic thoracic radicular pain (Left-sided); Chronic occipital neuralgia (Left); Neuropathic pain; Neurogenic pain; History of rib fracture (4th and 8th rib) (Left); Vertebral body hemangioma (T11); Metatarsalgia of foot (Right); Chronic abdominal pain (2ry area of Pain) (Bilateral) (L>R); Primary osteoarthritis; Chronic neck pain (4th area of Pain) (Bilateral) (L>R); Cervical facet syndrome (Bilateral) (L>R); Muscle spasticity; Tremors of nervous system; Muscle twitching; Cervical spondylosis; Chronic low back pain (3ry area of Pain) (Bilateral) w/o sciatica; Chronic low back pain (Bilateral) w/ sciatica (Bilateral); Chronic sacroiliac joint pain (Bilateral); Other specified dorsopathies, sacral and sacrococcygeal region; Chronic sacroiliac joint somatic dysfunction (Bilateral); Epigastric pain; Left upper quadrant pain; Generalized pain;  Headache; Rib pain (Left); Abnormal MRI, thoracic spine open (08/26/2013); Fracture of rib (4th and 8th rib) (Left); and 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 Pain Assessment: Severity of   is reported as a  /10. Location:    / . Onset:  . Quality:  . Timing:  . Modifying factor(s):  Marland Kitchen Vitals:  vitals were not taken for this visit.  BMI: Estimated body mass index is 29.99 kg/m as calculated from the following:   Height as of 09/10/22: 5' 5.5" (1.664 m).   Weight as of 09/10/22: 183 lb (83 kg). Last encounter: 10/03/2022. Last procedure: 09/10/2022.  Reason for encounter: medication management. ***  RTCB: 01/27/2023   Pharmacotherapy Assessment  Analgesic: MS IR 15 mg mg, 1 tab PO q 8 hrs (45 mg/day of morphine) MME/day: 45 mg/day.   Monitoring: [place] PMP: PDMP reviewed during this encounter.       Pharmacotherapy: No side-effects or adverse reactions reported. Compliance: No problems identified. Effectiveness: Clinically acceptable.  No notes on file  No results found for: "CBDTHCR" No results found for: "D8THCCBX" No results found for: "D9THCCBX"  UDS:  Summary  Date Value Ref Range Status  01/23/2022 Note  Final    Comment:    ==================================================================== [employer] Select 13 (MW) ==================================================================== Test                             Result       Flag       Units  Drug Present and Declared for Prescription Verification   Lorazepam                      >1290        EXPECTED   ng/mg creat    Source of lorazepam is a scheduled prescription medication.  Morphine                       >6452        EXPECTED   ng/mg creat   Normorphine                    491          EXPECTED   ng/mg creat    Potential sources of large amounts of morphine in the absence of    codeine include administration of morphine or use of heroin.     Normorphine is an expected metabolite of  morphine.    Hydromorphone                  284          EXPECTED   ng/mg creat    Hydromorphone may be administered as a scheduled prescription    medication and is also an expected metabolite of hydrocodone.    Hydromorphone is also a minor metabolite of morphine which is also    present in this specimen. Concentrations of hydromorphone rarely    exceed 5% of the morphine concentration when metabolism of morphine    is the sole source of hydromorphone.  Drug Absent but Declared for Prescription Verification   Hydrocodone                    Not Detected UNEXPECTED ng/mg creat    Hydrocodone is almost always present in patients taking this drug    consistently. Absence of hydrocodone could be due to lapse of time    since the last dose or unusual pharmacokinetics (rapid metabolism).  ==================================================================== Test                      Result    Flag   Units      Ref Range   Creatinine              155              mg/dL      >=20 ==================================================================== Declared Medications:  The flagging and interpretation on this report are based on the  following declared medications.  Unexpected results may arise from  inaccuracies in the declared medications.   **Note: The testing scope of this panel includes these medications:   Hydrocodone (Norco)  Lorazepam (Ativan)  Morphine (MSIR)   **Note: The testing scope of this panel does not include the  following reported medications:   Acetaminophen (Tylenol)  Acetaminophen (Norco)  Biotin  Bupropion (Wellbutrin XL)  Buspirone (Buspar)  Cholecalciferol  Cranberry  Cyanocobalamin  Diclofenac (Voltaren)  Doxepin (Sinequan)  Estradiol (Estrace)  Eye Drop  Fluoxetine (Prozac)  Folic Acid  Irbesartan (Avapro)  Iron  Loratadine (Claritin)  Magnesium  Montelukast  Multivitamin  Naloxone (Narcan)  Potassium  Probiotic  Supplement  Vitamin  C ==================================================================== For clinical consultation, please call [phone]. ====================================================================       ROS  Constitutional: Denies any fever or chills Gastrointestinal: No reported hemesis, hematochezia, vomiting, or acute GI distress Musculoskeletal: Denies any acute onset joint swelling, redness, loss of ROM, or weakness Neurological: No reported episodes of acute onset apraxia, aphasia, dysarthria, agnosia, amnesia, paralysis, loss of coordination, or loss of consciousness  Medication Review  Benefiber, Biotin, Cholecalciferol, Cranberry, Eszopiclone, FLUoxetine, Iron-Vitamin C, LORazepam, Magnesium, Peppermint Oil, Polyethyl Glycol-Propyl Glycol, Prenatal MV & Min w/FA-DHA, RA Probiotic Gummies, acetaminophen, benzonatate, buPROPion,  busPIRone, diclofenac Sodium, esomeprazole, estradiol, irbesartan, lansoprazole, loratadine, montelukast, morphine, naloxone, potassium gluconate, and vitamin B-12  History Review  Allergy: Kari Gibbs is allergic to iohexol, ace inhibitors, losartan potassium, neosporin [neomycin-bacitracin zn-polymyx], and bacitracin-polymyxin b. Drug: Kari Gibbs  reports no history of drug use. Alcohol:  reports current alcohol use. Tobacco:  reports that she has never smoked. She has never used smokeless tobacco. Social: Kari Gibbs  reports that she has never smoked. She has never used smokeless tobacco. She reports current alcohol use. She reports that she does not use drugs. 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 Family: family history includes Alzheimer's disease in her father; Hypertension in her mother.  Laboratory Chemistry Profile   Renal Lab Results  Component Value Date   BUN 19 11/15/2021   CREATININE 0.90 11/15/2021   GFRAA >60 01/04/2016   GFRNONAA >60 11/15/2021    Hepatic Lab Results  Component Value Date   AST 29 04/04/2021   ALT 30 04/04/2021   ALBUMIN 4.6 04/04/2021   ALKPHOS 89 04/04/2021   AMYLASE 30 02/27/2010   LIPASE 22 10/12/2012    Electrolytes Lab Results  Component Value Date   NA 140 11/15/2021   K 5.2 (H) 11/15/2021   CL 105 11/15/2021   CALCIUM 9.4 11/15/2021   MG 2.1 01/04/2016    Bone Lab Results  Component Value Date   25OHVITD1 49 01/04/2016   25OHVITD2 25 01/04/2016   25OHVITD3 24 01/04/2016    Inflammation (CRP: Acute Phase) (ESR: Chronic Phase) Lab Results  Component Value Date   CRP <0.5 01/04/2016   ESRSEDRATE 17 01/04/2016         Note: Above Lab results reviewed.  Recent Imaging Review  DG PAIN CLINIC C-ARM 1-60 MIN NO REPORT Fluoro was used, but no Radiologist interpretation will be provided.  Please refer to "NOTES" tab for provider progress note. Note: Reviewed        Physical  Exam  General appearance: Well nourished, well developed, and well hydrated. In no apparent acute distress Mental status: Alert, oriented x 3 (person, place, & time)       Respiratory: No evidence of acute respiratory distress Eyes: PERLA Vitals: There were no vitals taken for this visit. BMI: Estimated body mass index is 29.99 kg/m as calculated from the following:   Height as of 09/10/22: 5' 5.5" (1.664 m).   Weight as of 09/10/22: 183 lb (83 kg). Ideal: Patient weight not recorded  Assessment   Diagnosis Status  1. Chronic pain syndrome   2. Pharmacologic therapy   3. Chronic neck pain (4th area of Pain) (Bilateral) (L>R)   4. Chronic flank pain (1ry area of Pain) (Left)  5. Chronic abdominal pain (2ry area of Pain) (Bilateral) (L>R)   6. Encounter for medication management   7. Encounter for chronic pain management   8. Chronic use of opiate for therapeutic purpose   9. Chronic low back pain (3ry area of Pain) (Bilateral) w/o sciatica    Controlled Controlled Controlled   Updated Problems: No problems updated.  Plan of Care  Problem-specific:  No problem-specific Assessment & Plan notes found for this encounter.  Kari Gibbs has a current medication list which includes the following long-term medication(s): bupropion, diclofenac sodium, eszopiclone, fluoxetine, lansoprazole, and morphine.  Pharmacotherapy (Medications Ordered): No orders of the defined types were placed in this encounter.  Orders:  No orders of the defined types were placed in this encounter.  Follow-up plan:   No follow-ups on file.      Interventional Therapies  Risk Factors  Considerations:   ALLERGY: Contrast Dye (Anaphylactic - Type)    Planned  Pending:      Under consideration:   Diagnostic left costochondral joint injection  Diagnostic left C2 + TON NB    Completed:   Therapeutic left 8th rib intercostal NB x1 (09/10/2022)  Palliative left intercostal NB (T5 to T8) x1  (03/03/2017)  Palliative left GONB Blk x2 (07/17/2016)  Palliative bilateral SI Blk x1 (04/02/2018)    Therapeutic  Palliative (PRN) options:   Palliative left intercostal NB (T5 to T8) #2  Palliative left GONB Blk #3  Palliative bilateral SI Blk #2    Pharmacotherapy  Nonopioids transferred 05/29/2020: Gabapentin and Voltaren gel       Recent Visits Date Type Provider Dept  10/03/22 Office Visit [name], MD [facility]  09/10/22 Procedure visit [name], MD [facility]  07/24/22 Office Visit [name], MD [facility]  Showing recent visits within past 90 days and meeting all other requirements Future Appointments Date Type Provider Dept  10/23/22 Appointment [name], MD [facility]  Showing future appointments within next 90 days and meeting all other requirements  I discussed the assessment and treatment plan with the patient. The patient was provided an opportunity to ask questions and all were answered. The patient agreed with the plan and demonstrated an understanding of the instructions.  Patient advised to call back or seek an in-person evaluation if the symptoms or condition worsens.  Duration of encounter: *** minutes.  Total time on encounter, as per AMA guidelines included both the face-to-face and non-face-to-face time personally spent by the physician and/or other qualified health care professional(s) on the day of the encounter (includes time in activities that require the physician or other qualified health care professional and does not include time in activities normally performed by clinical staff). Physician's time may include the following activities when performed: Preparing to see the patient (e.g., pre-charting review of records, searching for previously ordered imaging, lab work, and nerve conduction tests) Review of prior analgesic pharmacotherapies. Reviewing PMP Interpreting  ordered tests (e.g., lab work, imaging, nerve conduction tests) Performing post-procedure evaluations, including interpretation of diagnostic procedures Obtaining and/or reviewing separately obtained history Performing a medically appropriate examination and/or evaluation Counseling and educating the patient/family/caregiver Ordering medications, tests, or procedures Referring and communicating with other health care professionals (when not separately reported) Documenting clinical information in the electronic or other health record Independently interpreting results (not separately reported) and communicating results to the patient/ family/caregiver Care coordination (not separately reported)  Note by: Gaspar Cola, MD Date: 10/23/2022; Time: 7:53  AM

## 2022-10-23 ENCOUNTER — Ambulatory Visit: Payer: Medicare Other | Attending: Pain Medicine | Admitting: Pain Medicine

## 2022-10-23 ENCOUNTER — Encounter: Payer: Self-pay | Admitting: Pain Medicine

## 2022-10-23 VITALS — BP 132/73 | HR 79 | Temp 97.2°F | Resp 16 | Ht 65.5 in | Wt 178.0 lb

## 2022-10-23 DIAGNOSIS — M542 Cervicalgia: Secondary | ICD-10-CM | POA: Diagnosis not present

## 2022-10-23 DIAGNOSIS — R1011 Right upper quadrant pain: Secondary | ICD-10-CM | POA: Insufficient documentation

## 2022-10-23 DIAGNOSIS — G894 Chronic pain syndrome: Secondary | ICD-10-CM | POA: Insufficient documentation

## 2022-10-23 DIAGNOSIS — Z79891 Long term (current) use of opiate analgesic: Secondary | ICD-10-CM | POA: Diagnosis not present

## 2022-10-23 DIAGNOSIS — G588 Other specified mononeuropathies: Secondary | ICD-10-CM | POA: Insufficient documentation

## 2022-10-23 DIAGNOSIS — M545 Low back pain, unspecified: Secondary | ICD-10-CM

## 2022-10-23 DIAGNOSIS — Z79899 Other long term (current) drug therapy: Secondary | ICD-10-CM | POA: Insufficient documentation

## 2022-10-23 DIAGNOSIS — G8929 Other chronic pain: Secondary | ICD-10-CM

## 2022-10-23 DIAGNOSIS — R109 Unspecified abdominal pain: Secondary | ICD-10-CM

## 2022-10-23 DIAGNOSIS — R1012 Left upper quadrant pain: Secondary | ICD-10-CM | POA: Insufficient documentation

## 2022-10-23 MED ORDER — METHOCARBAMOL 1000 MG/10ML IJ SOLN
200.0000 mg | Freq: Once | INTRAMUSCULAR | Status: AC
  Administered 2022-10-23: 200 mg via INTRAMUSCULAR

## 2022-10-23 MED ORDER — METHOCARBAMOL 1000 MG/10ML IJ SOLN
INTRAMUSCULAR | Status: AC
  Filled 2022-10-23: qty 10

## 2022-10-23 MED ORDER — MORPHINE SULFATE 15 MG PO TABS: 15.0000 mg | ORAL_TABLET | Freq: Three times a day (TID) | ORAL | 0 refills | Status: DC | PRN

## 2022-10-23 MED ORDER — KETOROLAC TROMETHAMINE 60 MG/2ML IM SOLN
60.0000 mg | Freq: Once | INTRAMUSCULAR | Status: AC
  Administered 2022-10-23: 60 mg via INTRAMUSCULAR

## 2022-10-23 MED ORDER — KETOROLAC TROMETHAMINE 60 MG/2ML IM SOLN
INTRAMUSCULAR | Status: AC
  Filled 2022-10-23: qty 2

## 2022-10-23 NOTE — Progress Notes (Unsigned)
Nursing Pain Medication Assessment:  Safety precautions to be maintained throughout the outpatient stay will include: orient to surroundings, keep bed in low position, maintain call bell within reach at all times, provide assistance with transfer out of bed and ambulation.  Medication Inspection Compliance: Pill count conducted under aseptic conditions, in front of the patient. Neither the pills nor the bottle was removed from the patient's sight at any time. Once count was completed pills were immediately returned to the patient in their original bottle.  Medication: Morphine IR Pill/Patch Count:  20 of 90 pills remain Pill/Patch Appearance: Markings consistent with prescribed medication Bottle Appearance: Standard pharmacy container. Clearly labeled. Filled Date: 02 / 18 / 2024 Last Medication intake:  Today

## 2022-10-23 NOTE — Patient Instructions (Signed)
______________________________________________________________________  Procedure instructions  Do not eat or drink fluids (other than water) for 6 hours before your procedure  No water for 2 hours before your procedure  Take your blood pressure medicine with a sip of water  Arrive 30 minutes before your appointment  Carefully read the "Preparing for your procedure" detailed instructions  If you have questions call us at [phone]  _____________________________________________________________________    ______________________________________________________________________  Preparing for your procedure  Appointments: If you think you may not be able to keep your appointment, call 24-48 hours in advance to cancel. We need time to make it available to others.  During your procedure appointment there will be: No Prescription Refills. No disability issues to discussed. No medication changes or discussions.  Instructions: Food intake: Avoid eating anything solid for at least 8 hours prior to your procedure. Clear liquid intake: You may take clear liquids such as water up to 2 hours prior to your procedure. (No carbonated drinks. No soda.) Transportation: Unless otherwise stated by your physician, bring a driver. Morning Medicines: Except for blood thinners, take all of your other morning medications with a sip of water. Make sure to take your heart and blood pressure medicines. If your blood pressure's lower number is above 100, the case will be rescheduled. Blood thinners: Make sure to stop your blood thinners as instructed.  If you take a blood thinner, but were not instructed to stop it, call our office [phone] and ask to talk to a nurse. Not stopping a blood thinner prior to certain procedures could lead to serious complications. Diabetics on insulin: Notify the staff so that you can be scheduled 1st case in the morning. If your diabetes requires high dose insulin,  take only  of your normal insulin dose the morning of the procedure and notify the staff that you have done so. Preventing infections: Shower with an antibacterial soap the morning of your procedure.  Build-up your immune system: Take 1000 mg of Vitamin C with every meal (3 times a day) the day prior to your procedure. Antibiotics: Inform the nursing staff if you are taking any antibiotics or if you have any conditions that may require antibiotics prior to procedures. (Example: recent joint implants)   Pregnancy: If you are pregnant make sure to notify the nursing staff. Not doing so may result in injury to the fetus, including death.  Sickness: If you have a cold, fever, or any active infections, call and cancel or reschedule your procedure. Receiving steroids while having an infection may result in complications. Arrival: You must be in the facility at least 30 minutes prior to your scheduled procedure. Tardiness: Your scheduled time is also the cutoff time. If you do not arrive at least 15 minutes prior to your procedure, you will be rescheduled.  Children: Do not bring any children with you. Make arrangements to keep them home. Dress appropriately: There is always a possibility that your clothing may get soiled. Avoid long dresses. Valuables: Do not bring any jewelry or valuables.  Reasons to call and reschedule or cancel your procedure: (Following these recommendations will minimize the risk of a serious complication.) Surgeries: Avoid having procedures within 2 weeks of any surgery. (Avoid for 2 weeks before or after any surgery). Flu Shots: Avoid having procedures within 2 weeks of a flu shots or . (Avoid for 2 weeks before or after immunizations). Barium: Avoid having a procedure within 7-10 days after having had a radiological study involving the use of  radiological contrast. (Myelograms, Barium swallow or enema study). Heart attacks: Avoid any elective procedures or surgeries for the  initial 6 months after a "Myocardial Infarction" (Heart Attack). Blood thinners: It is imperative that you stop these medications before procedures. Let us know if you if you take any blood thinner.  Infection: Avoid procedures during or within two weeks of an infection (including chest colds or gastrointestinal problems). Symptoms associated with infections include: Localized redness, fever, chills, night sweats or profuse sweating, burning sensation when voiding, cough, congestion, stuffiness, runny nose, sore throat, diarrhea, nausea, vomiting, cold or Flu symptoms, recent or current infections. It is specially important if the infection is over the area that we intend to treat. Heart and lung problems: Symptoms that may suggest an active cardiopulmonary problem include: cough, chest pain, breathing difficulties or shortness of breath, dizziness, ankle swelling, uncontrolled high or unusually low blood pressure, and/or palpitations. If you are experiencing any of these symptoms, cancel your procedure and contact your primary care physician for an evaluation.  Remember:  Regular Business hours are:  Monday to Thursday 8:00 AM to 4:00 PM  Provider's Schedule: [name], MD:  Procedure days: Tuesday and Thursday 7:30 AM to 4:00 PM  Gillis Santa, MD:  Procedure days: Monday and Wednesday 7:30 AM to 4:00 PM  ______________________________________________________________________    ____________________________________________________________________________________________  General Risks and Possible Complications  Patient Responsibilities: It is important that you read this as it is part of your informed consent. It is our duty to inform you of the risks and possible complications associated with treatments offered to you. It is your responsibility as a patient to read this and to ask questions about anything that is not clear or that you believe was not covered in this  document.  Patient's Rights: You have the right to refuse treatment. You also have the right to change your mind, even after initially having agreed to have the treatment done. However, under this last option, if you wait until the last second to change your mind, you may be charged for the materials used up to that point.  Introduction: Medicine is not an Chief Strategy Officer. Everything in Medicine, including the lack of treatment(s), carries the potential for danger, harm, or loss (which is by definition: Risk). In Medicine, a complication is a secondary problem, condition, or disease that can aggravate an already existing one. All treatments carry the risk of possible complications. The fact that a side effects or complications occurs, does not imply that the treatment was conducted incorrectly. It must be clearly understood that these can happen even when everything is done following the highest safety standards.  No treatment: You can choose not to proceed with the proposed treatment alternative. The "PRO(s)" would include: avoiding the risk of complications associated with the therapy. The "CON(s)" would include: not getting any of the treatment benefits. These benefits fall under one of three categories: diagnostic; therapeutic; and/or palliative. Diagnostic benefits include: getting information which can ultimately lead to improvement of the disease or symptom(s). Therapeutic benefits are those associated with the successful treatment of the disease. Finally, palliative benefits are those related to the decrease of the primary symptoms, without necessarily curing the condition (example: decreasing the pain from a flare-up of a chronic condition, such as incurable terminal cancer).  General Risks and Complications: These are associated to most interventional treatments. They can occur alone, or in combination. They fall under one of the following six (6) categories: no benefit or worsening of symptoms;  bleeding; infection; nerve damage; allergic reactions; and/or death. No benefits or worsening of symptoms: In Medicine there are no guarantees, only probabilities. No healthcare provider can ever guarantee that a medical treatment will work, they can only state the probability that it may. Furthermore, there is always the possibility that the condition may worsen, either directly, or indirectly, as a consequence of the treatment. Bleeding: This is more common if the patient is taking a blood thinner, either prescription or over the counter (example: Goody Powders, Fish oil, Aspirin, Garlic, etc.), or if suffering a condition associated with impaired coagulation (example: Hemophilia, cirrhosis of the liver, low platelet counts, etc.). However, even if you do not have one on these, it can still happen. If you have any of these conditions, or take one of these drugs, make sure to notify your treating physician. Infection: This is more common in patients with a compromised immune system, either due to disease (example: diabetes, cancer, human immunodeficiency virus [HIV], etc.), or due to medications or treatments (example: therapies used to treat cancer and rheumatological diseases). However, even if you do not have one on these, it can still happen. If you have any of these conditions, or take one of these drugs, make sure to notify your treating physician. Nerve Damage: This is more common when the treatment is an invasive one, but it can also happen with the use of medications, such as those used in the treatment of cancer. The damage can occur to small secondary nerves, or to large primary ones, such as those in the spinal cord and brain. This damage may be temporary or permanent and it may lead to impairments that can range from temporary numbness to permanent paralysis and/or brain death. Allergic Reactions: Any time a substance or material comes in contact with our body, there is the possibility of an  allergic reaction. These can range from a mild skin rash (contact dermatitis) to a severe systemic reaction (anaphylactic reaction), which can result in death. Death: In general, any medical intervention can result in death, most of the time due to an unforeseen complication. ____________________________________________________________________________________________    ____________________________________________________________________________________________  Opioid Pain Medication Update  To: All patients taking opioid pain medications. (I.e.: hydrocodone, hydromorphone, oxycodone, oxymorphone, morphine, codeine, methadone, tapentadol, tramadol, buprenorphine, fentanyl, etc.)  Re: Updated review of side effects and adverse reactions of opioid analgesics, as well as new information about long term effects of this class of medications.  Direct risks of long-term opioid therapy are not limited to opioid addiction and overdose. Potential medical risks include serious fractures, breathing problems during sleep, hyperalgesia, immunosuppression, chronic constipation, bowel obstruction, myocardial infarction, and tooth decay secondary to xerostomia.  Unpredictable adverse effects that can occur even if you take your medication correctly: Cognitive impairment, respiratory depression, and death. Most people think that if they take their medication "correctly", and "as instructed", that they will be safe. Nothing could be farther from the truth. In reality, a significant amount of recorded deaths associated with the use of opioids has occurred in individuals that had taken the medication for a long time, and were taking their medication correctly. The following are examples of how this can happen: Patient taking his/her medication for a long time, as instructed, without any side effects, is given a certain antibiotic or another unrelated medication, which in turn triggers a "Drug-to-drug interaction"  leading to disorientation, cognitive impairment, impaired reflexes, respiratory depression or an untoward event leading to serious bodily harm or injury, including death.  Patient taking  his/her medication for a long time, as instructed, without any side effects, develops an acute impairment of liver and/or kidney function. This will lead to a rapid inability of the body to breakdown and eliminate their pain medication, which will result in effects similar to an "overdose", but with the same medicine and dose that they had always taken. This again may lead to disorientation, cognitive impairment, impaired reflexes, respiratory depression or an untoward event leading to serious bodily harm or injury, including death.  A similar problem will occur with patients as they grow older and their liver and kidney function begins to decrease as part of the aging process.  Background information: Historically, the original case for using long-term opioid therapy to treat chronic noncancer pain was based on safety assumptions that subsequent experience has called into question. In 1996, the American Pain Society and the [place] [employer] issued a consensus statement supporting long-term opioid therapy. This statement acknowledged the dangers of opioid prescribing but concluded that the risk for addiction was low; respiratory depression induced by opioids was short-lived, occurred mainly in opioid-naive patients, and was antagonized by pain; tolerance was not a common problem; and efforts to control diversion should not constrain opioid prescribing. This has now proven to be wrong. Experience regarding the risks for opioid addiction, misuse, and overdose in community practice has failed to support these assumptions.  According to the Centers for Disease Control and Prevention, fatal overdoses involving opioid analgesics have increased sharply over the past decade. Currently, more than 96,700 people die  from drug overdoses every year. Opioids are a factor in 7 out of every 10 overdose deaths. Deaths from drug overdose have surpassed motor vehicle accidents as the leading cause of death for individuals between the ages of 23 and 25.  Clinical data suggest that neuroendocrine dysfunction may be very common in both men and women, potentially causing hypogonadism, erectile dysfunction, infertility, decreased libido, osteoporosis, and depression. Recent studies linked higher opioid dose to increased opioid-related mortality. Controlled observational studies reported that long-term opioid therapy may be associated with increased risk for cardiovascular events. Subsequent meta-analysis concluded that the safety of long-term opioid therapy in elderly patients has not been proven.   Side Effects and adverse reactions: Common side effects: Drowsiness (sedation). Dizziness. Nausea and vomiting. Constipation. Physical dependence -- Dependence often manifests with withdrawal symptoms when opioids are discontinued or decreased. Tolerance -- As you take repeated doses of opioids, you require increased medication to experience the same effect of pain relief. Respiratory depression -- This can occur in healthy people, especially with higher doses. However, people with COPD, asthma or other lung conditions may be even more susceptible to fatal respiratory impairment.  Uncommon side effects: An increased sensitivity to feeling pain and extreme response to pain (hyperalgesia). Chronic use of opioids can lead to this. Delayed gastric emptying (the process by which the contents of your stomach are moved into your small intestine). Muscle rigidity. Immune system and hormonal dysfunction. Quick, involuntary muscle jerks (myoclonus). Arrhythmia. Itchy skin (pruritus). Dry mouth (xerostomia).  Long-term side effects: Chronic constipation. Sleep-disordered breathing (SDB). Increased risk of bone  fractures. Hypothalamic-pituitary-adrenal dysregulation. Increased risk of overdose.  RISKS: Fractures and Falls:  Opioids increase the risk and incidence of falls. This is of particular importance in elderly patients.  Endocrine System:  Long-term administration is associated with endocrine abnormalities (endocrinopathies). (Also known as Opioid-induced Endocrinopathy) Influences on both the hypothalamic-pituitary-adrenal axis?and the hypothalamic-pituitary-gonadal axis have been demonstrated with consequent hypogonadism and adrenal  insufficiency in both sexes. Hypogonadism and decreased levels of dehydroepiandrosterone sulfate have been reported in men and women. Endocrine effects include: Amenorrhoea in women (abnormal absence of menstruation) Reduced libido in both sexes Decreased sexual function Erectile dysfunction in men Hypogonadisms (decreased testicular function with shrinkage of testicles) Infertility Depression and fatigue Loss of muscle mass Anxiety Depression Immune suppression Hyperalgesia Weight gain Anemia Osteoporosis Patients (particularly women of childbearing age) should avoid opioids. There is insufficient evidence to recommend routine monitoring of asymptomatic patients taking opioids in the long-term for hormonal deficiencies.  Immune System: Human studies have demonstrated that opioids have an immunomodulating effect. These effects are mediated via opioid receptors both on immune effector cells and in the central nervous system. Opioids have been demonstrated to have adverse effects on antimicrobial response and anti-tumour surveillance. Buprenorphine has been demonstrated to have no impact on immune function.  Opioid Induced Hyperalgesia: Human studies have demonstrated that prolonged use of opioids can lead to a state of abnormal pain sensitivity, sometimes called opioid induced hyperalgesia (OIH). Opioid induced hyperalgesia is not usually seen in the  absence of tolerance to opioid analgesia. Clinically, hyperalgesia may be diagnosed if the patient on long-term opioid therapy presents with increased pain. This might be qualitatively and anatomically distinct from pain related to disease progression or to breakthrough pain resulting from development of opioid tolerance. Pain associated with hyperalgesia tends to be more diffuse than the pre-existing pain and less defined in quality. Management of opioid induced hyperalgesia requires opioid dose reduction.  Cancer: Chronic opioid therapy has been associated with an increased risk of cancer among noncancer patients with chronic pain. This association was more evident in chronic strong opioid users. Chronic opioid consumption causes significant pathological changes in the small intestine and colon. Epidemiological studies have found that there is a link between opium dependence and initiation of gastrointestinal cancers. Cancer is the second leading cause of death after cardiovascular disease. Chronic use of opioids can cause multiple conditions such as GERD, immunosuppression and renal damage as well as carcinogenic effects, which are associated with the incidence of cancers.   Mortality: Long-term opioid use has been associated with increased mortality among patients with chronic non-cancer pain (CNCP).  Prescription of long-acting opioids for chronic noncancer pain was associated with a significantly increased risk of all-cause mortality, including deaths from causes other than overdose.  Reference: Von Korff M, Kolodny A, Deyo RA, Chou R. Long-term opioid therapy reconsidered. Ann Intern Med. 2011 Sep 6;155(5):325-8. doi: 10.7326/[id number]. PMID: [id number]; PMCIDXX:1631110. Morley Kos, Hayward RA, Dunn KM, [place] KP. Risk of adverse events in patients prescribed long-term opioids: A cohort study in the [place] [facility] [employer]. Eur J Pain. 2019  May;23(5):908-922. doi: 10.1002/ejp.1357. Epub 2019 Jan 31. PMID: [id number]. Colameco S, Coren JS, Ciervo CA. Continuous opioid treatment for chronic noncancer pain: a time for moderation in prescribing. Postgrad Med. 2009 Jul;121(4):61-6. doi: 10.3810/pgm.2009.07.2032. PMID: [id number]. Heywood Bene RN, Alva SD, Blazina I, Rosalio Loud, Bougatsos C, Deyo RA. The effectiveness and risks of long-term opioid therapy for chronic pain: a systematic review for a [employer] of [employer] Pathways to [employer]. Ann Intern Med. 2015 Feb 17;162(4):276-86. doi: [id number]. PMID: [id number]. Marjory Sneddon [facility], Makuc DM. NCHS Data Brief No. 22. [place]: Centers for Disease Control and Prevention; 2009. Sep, Increase in Fatal Poisonings Involving Opioid Analgesics in the [place], 1999-2006. Song IA, Choi HR, Oh TK. Long-term opioid use and mortality in patients with  chronic non-cancer pain: Ten-year follow-up study in [place] from 2010 through 2019. EClinicalMedicine. 2022 Jul 18;51:101558. doi: 10.1016/j.[id number].[id number]. PMID: [id number]; PMCIDYT:2540545. Huser, W., Schubert, T., Vogelmann, T. et al. All-cause mortality in patients with long-term opioid therapy compared with non-opioid analgesics for chronic non-cancer pain: a database study. Jarrell Med 18, 162 (2020). [email or web address] Rashidian H, Roxy Cedar, Malekzadeh R, Haghdoost AA. An Ecological Study of the Association between Opiate Use and Incidence of Cancers. Addict Health. 2016 Fall;8(4):252-260. PMID: [id number]; PMCIDYE:622990.  Our Goal: Our goal is to control your pain with means other than the use of opioid pain medications.  Our Recommendation: Talk to your physician about coming off of these medications. We can assist you with the tapering down and stopping these medicines. Based on the new information, even if you cannot completely stop the medication, a  decrease in the dose may be associated with a lesser risk. Ask for other means of controlling the pain. Decrease or eliminate those factors that significantly contribute to your pain such as smoking, obesity, and a diet heavily tilted towards "inflammatory" nutrients.  Last Updated: 10/09/2022   ____________________________________________________________________________________________     ____________________________________________________________________________________________  Patient Information update  To: All of our patients.  Re: Name change.  It has been made official that our current name, "[facility]"   will soon be changed to "[facility]".   The purpose of this change is to eliminate any confusion created by the concept of our practice being a "Medication [facility] Pain [facility]". In the past this has led to the misconception that we treat pain primarily by the use of prescription medications.  Nothing can be farther from the truth.   Understanding PAIN MANAGEMENT: To further understand what our practice does, you first have to understand that "Pain Management" is a subspecialty that requires additional training once a physician has completed their specialty training, which can be in either Anesthesia, Neurology, Psychiatry, or Physical Medicine and Rehabilitation (PMR). Each one of these contributes to the final approach taken by each physician to the management of their patient's pain. To be a "Pain Management Specialist" you must have first completed one of the specialty trainings below.  Anesthesiologists - trained in clinical pharmacology and interventional techniques such as nerve blockade and regional as well as central neuroanatomy. They are trained to block pain before, during, and after surgical interventions.  Neurologists - trained in the diagnosis and  pharmacological treatment of complex neurological conditions, such as Multiple Sclerosis, Parkinson's, spinal cord injuries, and other systemic conditions that may be associated with symptoms that may include but are not limited to pain. They tend to rely primarily on the treatment of chronic pain using prescription medications.  Psychiatrist - trained in conditions affecting the psychosocial wellbeing of patients including but not limited to depression, anxiety, schizophrenia, personality disorders, addiction, and other substance use disorders that may be associated with chronic pain. They tend to rely primarily on the treatment of chronic pain using prescription medications.   Physical Medicine and Rehabilitation (PMR) physicians, also known as physiatrists - trained to treat a wide variety of medical conditions affecting the brain, spinal cord, nerves, bones, joints, ligaments, muscles, and tendons. Their training is primarily aimed at treating patients that have suffered injuries that have caused severe physical impairment. Their training is primarily aimed at the physical therapy and rehabilitation of those patients. They may also work alongside orthopedic surgeons or neurosurgeons  using their expertise in assisting surgical patients to recover after their surgeries.  INTERVENTIONAL PAIN MANAGEMENT is sub-subspecialty of Pain Management.  Our physicians are Board-certified in Anesthesia, Pain Management, and Interventional Pain Management.  This meaning that not only have they been trained and Board-certified in their specialty of Anesthesia, and subspecialty of Pain Management, but they have also received further training in the sub-subspecialty of Interventional Pain Management, in order to become Board-certified as INTERVENTIONAL PAIN MANAGEMENT SPECIALIST.    Mission: Our goal is to use our skills in  [place] as alternatives to the chronic use of prescription opioid  medications for the treatment of pain. To make this more clear, we have changed our name to reflect what we do and offer. We will continue to offer medication management assessment and recommendations, but we will not be taking over any patient's medication management.  ____________________________________________________________________________________________     ____________________________________________________________________________________________  National Pain Medication Shortage  The [place] is experiencing worsening drug shortages. These have had a negative widespread effect on patient care and treatment. Not expected to improve any time soon. Predicted to last past 2029.   Drug shortage list (generic names) Oxycodone IR Oxycodone/APAP Oxymorphone IR Hydromorphone Hydrocodone/APAP Morphine  Where is the problem?  Manufacturing and supply level.  Will this shortage affect you?  Only if you take any of the above pain medications.  How? You may be unable to fill your prescription.  Your pharmacist may offer a "partial fill" of your prescription. (Warning: Do not accept partial fills.) Prescriptions partially filled cannot be transferred to another pharmacy. Read our Medication Rules and Regulation. Depending on how much medicine you are dependent on, you may experience withdrawals when unable to get the medication.  Recommendations: Consider ending your dependence on opioid pain medications. Ask your pain specialist to assist you with the process. Consider switching to a medication currently not in shortage, such as Buprenorphine. Talk to your pain specialist about this option. Consider decreasing your pain medication requirements by managing tolerance thru "Drug Holidays". This may help minimize withdrawals, should you run out of medicine. Control your pain thru the use of non-pharmacological interventional therapies.   Your prescriber: Prescribers cannot be blamed for  shortages. Medication manufacturing and supply issues cannot be fixed by the prescriber.   NOTE: The prescriber is not responsible for supplying the medication, or solving supply issues. Work with your pharmacist to solve it. The patient is responsible for the decision to take or continue taking the medication and for identifying and securing a legal supply source. By law, supplying the medication is the job and responsibility of the pharmacy. The prescriber is responsible for the evaluation, monitoring, and prescribing of these medications.   Prescribers will NOT: Re-issue prescriptions that have been partially filled. Re-issue prescriptions already sent to a pharmacy.  Re-send prescriptions to a different pharmacy because yours did not have your medication. Ask pharmacist to order more medicine or transfer the prescription to another pharmacy. (Read below.)  New 2023 regulation: "April 12, 2022 Revised Regulation Allows DEA-Registered Pharmacies to Transfer Electronic Prescriptions at a Patient's Request [place] Patients now have the ability to request their electronic prescription be transferred to another pharmacy without having to go back to their practitioner to initiate the request. This revised regulation went into effect on Monday, April 08, 2022.     At a patient's request, a DEA-registered retail pharmacy can now transfer an electronic prescription for a controlled substance (schedules  II-V) to another DEA-registered retail pharmacy. Prior to this change, patients would have to go through their practitioner to cancel their prescription and have it re-issued to a different pharmacy. The process was taxing and time consuming for both patients and practitioners.    The Drug Enforcement Administration [facility]) published its intent to revise the process for transferring electronic prescriptions on June 30, 2020.  The final rule was published  in the federal register on March 07, 2022 and went into effect 30 days later.  Under the final rule, a prescription can only be transferred once between pharmacies, and only if allowed under existing state or other applicable law. The prescription must remain in its electronic form; may not be altered in any way; and the transfer must be communicated directly between two licensed pharmacists. It's important to note, any authorized refills transfer with the original prescription, which means the entire prescription will be filled at the same pharmacy".  Reference: [email or web address] [facility] website announcement)  [email or web address] ([place])   [employer] / Vol. 88, No. 143 / Thursday, March 07, 2022 / Rules and Regulations DEPARTMENT OF JUSTICE  Drug Enforcement Administration  21 CFR Part 1306  [Docket No. [id number]]  RIN [id number] Transfer of Electronic Prescriptions for Schedules II-V Controlled Substances Between Pharmacies for Initial Filling  ____________________________________________________________________________________________     ____________________________________________________________________________________________  Transfer of Pain Medication between Pharmacies  Re: 2023 DEA Clarification on existing regulation  Published on DEA Website: April 12, 2022  Title: Revised Regulation Allows DEA-Registered Pharmacies to Conservator, museum/gallery Prescriptions at a Patient's Request [place]  "Patients now have the ability to request their electronic prescription be transferred to another pharmacy without having to go back to their practitioner to initiate the request. This revised regulation went into effect on Monday, April 08, 2022.     At a patient's  request, a DEA-registered retail pharmacy can now transfer an electronic prescription for a controlled substance (schedules II-V) to another DEA-registered retail pharmacy. Prior to this change, patients would have to go through their practitioner to cancel their prescription and have it re-issued to a different pharmacy. The process was taxing and time consuming for both patients and practitioners.    The Drug Enforcement Administration [facility]) published its intent to revise the process for transferring electronic prescriptions on June 30, 2020.  The final rule was published in the federal register on March 07, 2022 and went into effect 30 days later.  Under the final rule, a prescription can only be transferred once between pharmacies, and only if allowed under existing state or other applicable law. The prescription must remain in its electronic form; may not be altered in any way; and the transfer must be communicated directly between two licensed pharmacists. It's important to note, any authorized refills transfer with the original prescription, which means the entire prescription will be filled at the same pharmacy."    REFERENCES: 1. DEA website announcement [email or web address]  2. Department of Justice website  [email or web address]  3. DEPARTMENT OF JUSTICE Drug Enforcement Administration 21 CFR Part 1306 [Docket No. [id number]] RIN [id number] "Transfer of Electronic Prescriptions for Schedules II-V Controlled Substances Between Pharmacies for Initial Filling"  ____________________________________________________________________________________________     _______________________________________________________________________  Medication Rules  Purpose: To inform patients, and their family members, of our medication rules and  regulations.  Applies to: All patients receiving prescriptions from our practice (written or electronic).  Pharmacy of record: This is the pharmacy where your  electronic prescriptions will be sent. Make sure we have the correct one.  Electronic prescriptions: In compliance with the [place] (STOP) Act of 2017 (Session Lanny Cramp [phone]), effective August 12, 2018, all controlled substances must be electronically prescribed. Written prescriptions, faxing, or calling prescriptions to a pharmacy will no longer be done.  Prescription refills: These will be provided only during in-person appointments. No medications will be renewed without a "face-to-face" evaluation with your provider. Applies to all prescriptions.  NOTE: The following applies primarily to controlled substances (Opioid* Pain Medications).   Type of encounter (visit): For patients receiving controlled substances, face-to-face visits are required. (Not an option and not up to the patient.)  Patient's responsibilities: Pain Pills: Bring all pain pills to every appointment (except for procedure appointments). Pill Bottles: Bring pills in original pharmacy bottle. Bring bottle, even if empty. Always bring the bottle of the most recent fill.  Medication refills: You are responsible for knowing and keeping track of what medications you are taking and when is it that you will need a refill. The day before your appointment: write a list of all prescriptions that need to be refilled. The day of the appointment: give the list to the admitting nurse. Prescriptions will be written only during appointments. No prescriptions will be written on procedure days. If you forget a medication: it will not be "Called in", "Faxed", or "electronically sent". You will need to get another appointment to get these prescribed. No early refills. Do not call asking to have your prescription filled early. Partial  or short  prescriptions: Occasionally your pharmacy may not have enough pills to fill your prescription.  NEVER ACCEPT a partial fill or a prescription that is short of the total amount of pills that you were prescribed.  With controlled substances the law allows 72 hours for the pharmacy to complete the prescription.  If the prescription is not completed within 72 hours, the pharmacist will require a new prescription to be written. This means that you will be short on your medicine and we WILL NOT send another prescription to complete your original prescription.  Instead, request the pharmacy to send a carrier to a nearby branch to get enough medication to provide you with your full prescription. Prescription Accuracy: You are responsible for carefully inspecting your prescriptions before leaving our office. Have the discharge nurse carefully go over each prescription with you, before taking them home. Make sure that your name is accurately spelled, that your address is correct. Check the name and dose of your medication to make sure it is accurate. Check the number of pills, and the written instructions to make sure they are clear and accurate. Make sure that you are given enough medication to last until your next medication refill appointment. Taking Medication: Take medication as prescribed. When it comes to controlled substances, taking less pills or less frequently than prescribed is permitted and encouraged. Never take more pills than instructed. Never take the medication more frequently than prescribed.  Inform other Doctors: Always inform, all of your healthcare providers, of all the medications you take. Pain Medication from other Providers: You are not allowed to accept any additional pain medication from any other Doctor or Healthcare provider. There are two exceptions to this rule. (see below) In the event that you require additional pain medication, you are responsible for notifying us, as stated  below. Cough Medicine: Often these contain an opioid, such as codeine or hydrocodone. Never accept or take cough  medicine containing these opioids if you are already taking an opioid* medication. The combination may cause respiratory failure and death. Medication Agreement: You are responsible for carefully reading and following our Medication Agreement. This must be signed before receiving any prescriptions from our practice. Safely store a copy of your signed Agreement. Violations to the Agreement will result in no further prescriptions. (Additional copies of our Medication Agreement are available upon request.) Laws, Rules, & Regulations: All patients are expected to follow all Federal and [employer], Rules, [employer]. Ignorance of the Laws does not constitute a valid excuse.  Illegal drugs and Controlled Substances: The use of illegal substances (including, but not limited to marijuana and its derivatives) and/or the illegal use of any controlled substances is strictly prohibited. Violation of this rule may result in the immediate and permanent discontinuation of any and all prescriptions being written by our practice. The use of any illegal substances is prohibited. Adopted CDC guidelines & recommendations: Target dosing levels will be at or below 60 MME/day. Use of benzodiazepines** is not recommended.  Exceptions: There are only two exceptions to the rule of not receiving pain medications from other Healthcare Providers. Exception #1 (Emergencies): In the event of an emergency (i.e.: accident requiring emergency care), you are allowed to receive additional pain medication. However, you are responsible for: As soon as you are able, call our office [phone], at any time of the day or night, and leave a message stating your name, the date and nature of the emergency, and the name and dose of the medication prescribed. In the event that your call is answered by a member of our staff,  make sure to document and save the date, time, and the name of the person that took your information.  Exception #2 (Planned Surgery): In the event that you are scheduled by another doctor or dentist to have any type of surgery or procedure, you are allowed (for a period no longer than 30 days), to receive additional pain medication, for the acute post-op pain. However, in this case, you are responsible for picking up a copy of our "Post-op Pain Management for Surgeons" handout, and giving it to your surgeon or dentist. This document is available at our office, and does not require an appointment to obtain it. Simply go to our office during business hours (Monday-Thursday from 8:00 AM to 4:00 PM) (Friday 8:00 AM to 12:00 Noon) or if you have a scheduled appointment with [place], prior to your surgery, and ask for it by name. In addition, you are responsible for: calling our office [phone], at any time of the day or night, and leaving a message stating your name, name of your surgeon, type of surgery, and date of procedure or surgery. Failure to comply with your responsibilities may result in termination of therapy involving the controlled substances. Medication Agreement Violation. Following the above rules, including your responsibilities will help you in avoiding a Medication Agreement Violation ("Breaking your Pain Medication Contract").  Consequences:  Not following the above rules may result in permanent discontinuation of medication prescription therapy.  *Opioid medications include: morphine, codeine, oxycodone, oxymorphone, hydrocodone, hydromorphone, meperidine, tramadol, tapentadol, buprenorphine, fentanyl, methadone. **Benzodiazepine medications include: diazepam (Valium), alprazolam (Xanax), clonazepam (Klonopine), lorazepam (Ativan), clorazepate (Tranxene), chlordiazepoxide (Librium), estazolam (Prosom), oxazepam (Serax), temazepam (Restoril), triazolam (Halcion) (Last updated:  06/04/2022) ______________________________________________________________________    ______________________________________________________________________  Medication Recommendations and Reminders  Applies to: All patients receiving prescriptions (written and/or electronic).  Medication Rules & Regulations: You  are responsible for reading, knowing, and following our "Medication Rules" document. These exist for your safety and that of others. They are not flexible and neither are we. Dismissing or ignoring them is an act of "non-compliance" that may result in complete and irreversible termination of such medication therapy. For safety reasons, "non-compliance" will not be tolerated. As with the [place] fundamental legal principle of "ignorance of the law is no defense", we will accept no excuses for not having read and knowing the content of documents provided to you by our practice.  Pharmacy of record:  Definition: This is the pharmacy where your electronic prescriptions will be sent.  We do not endorse any particular pharmacy. It is up to you and your insurance to decide what pharmacy to use.  We do not restrict you in your choice of pharmacy. However, once we write for your prescriptions, we will NOT be re-sending more prescriptions to fix restricted supply problems created by your pharmacy, or your insurance.  The pharmacy listed in the electronic medical record should be the one where you want electronic prescriptions to be sent. If you choose to change pharmacy, simply notify our nursing staff. Changes will be made only during your regular appointments and not over the phone.  Recommendations: Keep all of your pain medications in a safe place, under lock and key, even if you live alone. We will NOT replace lost, stolen, or damaged medication. We do not accept "Police Reports" as proof of medications having been stolen. After you fill your prescription, take 1 week's worth of pills and put  them away in a safe place. You should keep a separate, properly labeled bottle for this purpose. The remainder should be kept in the original bottle. Use this as your primary supply, until it runs out. Once it's gone, then you know that you have 1 week's worth of medicine, and it is time to come in for a prescription refill. If you do this correctly, it is unlikely that you will ever run out of medicine. To make sure that the above recommendation works, it is very important that you make sure your medication refill appointments are scheduled at least 1 week before you run out of medicine. To do this in an effective manner, make sure that you do not leave the office without scheduling your next medication management appointment. Always ask the nursing staff to show you in your prescription , when your medication will be running out. Then arrange for the receptionist to get you a return appointment, at least 7 days before you run out of medicine. Do not wait until you have 1 or 2 pills left, to come in. This is very poor planning and does not take into consideration that we may need to cancel appointments due to bad weather, sickness, or emergencies affecting our staff. DO NOT ACCEPT A "Partial Fill": If for any reason your pharmacy does not have enough pills/tablets to completely fill or refill your prescription, do not allow for a "partial fill". The law allows the pharmacy to complete that prescription within 72 hours, without requiring a new prescription. If they do not fill the rest of your prescription within those 72 hours, you will need a separate prescription to fill the remaining amount, which we will NOT provide. If the reason for the partial fill is your insurance, you will need to talk to the pharmacist about payment alternatives for the remaining tablets, but again, DO NOT ACCEPT A PARTIAL FILL, unless you can  trust your pharmacist to obtain the remainder of the pills within 72 hours.  Prescription  refills and/or changes in medication(s):  Prescription refills, and/or changes in dose or medication, will be conducted only during scheduled medication management appointments. (Applies to both, written and electronic prescriptions.) No refills on procedure days. No medication will be changed or started on procedure days. No changes, adjustments, and/or refills will be conducted on a procedure day. Doing so will interfere with the diagnostic portion of the procedure. No phone refills. No medications will be "called into the pharmacy". No Fax refills. No weekend refills. No Holliday refills. No after hours refills.  Remember:  Business hours are:  Monday to Thursday 8:00 AM to 4:00 PM Provider's Schedule: [name], MD - Appointments are:  Medication management: Monday and Wednesday 8:00 AM to 4:00 PM Procedure day: Tuesday and Thursday 7:30 AM to 4:00 PM Gillis Santa, MD - Appointments are:  Medication management: Tuesday and Thursday 8:00 AM to 4:00 PM Procedure day: Monday and Wednesday 7:30 AM to 4:00 PM (Last update: 06/04/2022) ______________________________________________________________________    ____________________________________________________________________________________________  Drug Holidays  What is a "Drug Holiday"? Drug Holiday: is the name given to the process of slowly tapering down and temporarily stopping the pain medication for the purpose of decreasing or eliminating tolerance to the drug.  Benefits Improved effectiveness Decreased required effective dose Improved pain control End dependence on high dose therapy Decrease cost of therapy Uncovering "opioid-induced hyperalgesia". (OIH)  What is "opioid hyperalgesia"? It is a paradoxical increase in pain caused by exposure to opioids. Stopping the opioid pain medication, contrary to the expected, it actually decreases or completely eliminates the pain. Ref.: "A comprehensive review of  opioid-induced hyperalgesia". Brion Aliment, et.al. Pain Physician. 2011 Mar-Apr;14(2):145-61.  What is tolerance? Tolerance: the progressive loss of effectiveness of a pain medicine due to repetitive use. A common problem of opioid pain medications.  How long should a "Drug Holiday" last? Effectiveness depends on the patient staying off all opioid pain medicines for a minimum of 14 consecutive days. (2 weeks)  How about just taking less of the medicine? Does not work. Will not accomplish goal of eliminating the excess receptors.  How about switching to a different pain medicine? (AKA. "Opioid rotation") Does not work. Creates the illusion of effectiveness by taking advantage of inaccurate equivalent dose calculations between different opioids. -This "technique" was promoted by studies funded by [employer], such as [employer], creators of "OxyContin".  Can I stop the medicine "cold [place]"? Depends. You should always coordinate with your Pain Specialist to make the transition as smoothly as possible. Avoid stopping the medicine abruptly without consulting. We recommend a "slow taper".  What is a slow taper? Taper: refers to the gradual decrease in dose.   How do I stop/taper the dose? Slowly. Decrease the daily amount of pills that you take by one (1) pill every seven (7) days. This is called a "slow downward taper". Example: if you normally take four (4) pills per day, drop it to three (3) pills per day for seven (7) days, then to two (2) pills per day for seven (7) days, then to one (1) per day for seven (7) days, and then stop the medicine. The 14 day "Drug Holiday" starts on the first day without medicine.   Will I experience withdrawals? Unlikely with a slow taper.  What triggers withdrawals? Withdrawals are triggered by the sudden/abrupt stop of high dose opioids. Withdrawals can be avoided by slowly decreasing the  dose over a prolonged period of time.  What are  withdrawals? Symptoms associated with sudden/abrupt reduction/stopping of high-dose, long-term use of pain medication. Withdrawal are seldom seen on low dose therapy, or patients rarely taking opioid medication.  Early Withdrawal Symptoms may include: Agitation Anxiety Muscle aches Increased tearing Insomnia Runny nose Sweating Yawning  Late symptoms may include: Abdominal cramping Diarrhea Dilated pupils Goose bumps Nausea Vomiting  (Last update: 07/21/2022) ____________________________________________________________________________________________    ____________________________________________________________________________________________  WARNING: CBD (cannabidiol) & Delta (Delta-8 tetrahydrocannabinol) products.   Applicable to:  All individuals currently taking or considering taking CBD (cannabidiol) and, more important, all patients taking opioid analgesic controlled substances (pain medication). (Example: oxycodone; oxymorphone; hydrocodone; hydromorphone; morphine; methadone; tramadol; tapentadol; fentanyl; buprenorphine; butorphanol; dextromethorphan; meperidine; codeine; etc.)  Introduction:  Recently there has been a drive towards the use of "natural" products for the treatment of different conditions, including pain anxiety and sleep disorders. Marijuana and hemp are two varieties of the cannabis genus plants. Marijuana and its derivatives are illegal, while hemp and its derivatives are not. Cannabidiol (CBD) and tetrahydrocannabinol (THC), are two natural compounds found in plants of the Cannabis genus. They can both be extracted from hemp or marijuana. Both compounds interact with your body's endocannabinoid system in very different ways. CBD is associated with pain relief (analgesia) while THC is associated with the psychoactive effects ("the high") obtained from the use of marijuana products. There are two main types of THC: Delta-9, which comes from the marijuana  plant and it is illegal, and Delta-8, which comes from the hemp plant, and it is legal. (Both, Delta-9-THC and Delta-8-THC are psychoactive and give you "the high".)   Legality:  Marijuana and its derivatives: illegal Hemp and its derivatives: Legal (State dependent) UPDATE: (09/28/2021) The Drug Enforcement Agency ([place]) issued a letter stating that "delta" cannabinoids, including Delta-8-THCO and Delta-9-THCO, synthetically derived from hemp do not qualify as hemp and will be viewed as Schedule I drugs. (Schedule I drugs, substances, or chemicals are defined as drugs with no currently accepted medical use and a high potential for abuse. Some examples of Schedule I drugs are: heroin, lysergic acid diethylamide (LSD), marijuana (cannabis), 3,4-methylenedioxymethamphetamine (ecstasy), methaqualone, and peyote.) (https://jennings.com/)  Legal status of CBD in [place]:  "Conditionally Legal"  Reference: "FDA Regulation of Cannabis and Cannabis-Derived Products, Including Cannabidiol (CBD)" - SeekArtists.com.pt  Warning:  CBD is not FDA approved and has not undergo the same manufacturing controls as prescription drugs.  This means that the purity and safety of available CBD may be questionable. Most of the time, despite manufacturer's claims, it is contaminated with THC (delta-9-tetrahydrocannabinol - the chemical in marijuana responsible for the "HIGH").  When this is the case, the [facility] contaminant will trigger a positive urine drug screen (UDS) test for Marijuana (carboxy-THC).   The FDA recently put out a warning about 5 things that everyone should be aware of regarding Delta-8 THC: Delta-8 THC products have not been evaluated or approved by the FDA for safe use and may be marketed in ways that put the public health at risk. The FDA has received adverse event reports involving delta-8  THC-containing products. Delta-8 THC has psychoactive and intoxicating effects. Delta-8 THC manufacturing often involve use of potentially harmful chemicals to create the concentrations of delta-8 THC claimed in the marketplace. The final delta-8 THC product may have potentially harmful by-products (contaminants) due to the chemicals used in the process. Manufacturing of delta-8 THC products may occur in uncontrolled or unsanitary settings, which may lead to  the presence of unsafe contaminants or other potentially harmful substances. Delta-8 THC products should be kept out of the reach of children and pets.  NOTE: Because a positive UDS for any illicit substance is a violation of our medication agreement, your opioid analgesics (pain medicine) may be permanently discontinued.  MORE ABOUT CBD  General Information: CBD was discovered in [birth year] and it is a derivative of the cannabis sativa genus plants (Marijuana and Hemp). It is one of the 113 identified substances found in Marijuana. It accounts for up to 40% of the plant's extract. As of 2018, preliminary clinical studies on CBD included research for the treatment of anxiety, movement disorders, and pain. CBD is available and consumed in multiple forms, including inhalation of smoke or vapor, as an aerosol spray, and by mouth. It may be supplied as an oil containing CBD, capsules, dried cannabis, or as a liquid solution. CBD is thought not to be as psychoactive as THC (delta-9-tetrahydrocannabinol - the chemical in marijuana responsible for the "HIGH"). Studies suggest that CBD may interact with different biological target receptors in the body, including cannabinoid and other neurotransmitter receptors. As of 2018 the mechanism of action for its biological effects has not been determined.  Side-effects  Adverse reactions: Dry mouth, diarrhea, decreased appetite, fatigue, drowsiness, malaise, weakness, sleep disturbances, and others.  Drug interactions:   CBD may interact with medications such as blood-thinners. CBD causes drowsiness on its own and it will increase drowsiness caused by other medications, including antihistamines (such as Benadryl), benzodiazepines (Xanax, Ativan, Valium), antipsychotics, antidepressants, opioids, alcohol and supplements such as kava, melatonin and St. John's Wort.  Other drug interactions: Brivaracetam (Briviact); Caffeine; Carbamazepine (Tegretol); Citalopram (Celexa); Clobazam (Onfi); Eslicarbazepine (Aptiom); Everolimus (Zostress); Lithium; Methadone (Dolophine); Rufinamide (Banzel); Sedative medications (CNS depressants); Sirolimus (Rapamune); Stiripentol (Diacomit); Tacrolimus (Prograf); Tamoxifen ; Soltamox); Topiramate (Topamax); Valproate; Warfarin (Coumadin); Zonisamide. (Last update: 07/22/2022) ____________________________________________________________________________________________   ____________________________________________________________________________________________  Naloxone Nasal Spray  Why am I receiving this medication? [place] STOP ACT requires that all patients taking high dose opioids or at risk of opioids respiratory depression, be prescribed an opioid reversal agent, such as Naloxone (AKA: Narcan).  What is this medication? NALOXONE (nal OX one) treats opioid overdose, which causes slow or shallow breathing, severe drowsiness, or trouble staying awake. Call emergency services after using this medication. You may need additional treatment. Naloxone works by reversing the effects of opioids. It belongs to a group of medications called opioid blockers.  COMMON BRAND NAME(S): Kloxxado, Narcan  What should I tell my care team before I take this medication? They need to know if you have any of these conditions: Heart disease Substance use disorder An unusual or allergic reaction to naloxone, other medications, foods, dyes, or preservatives Pregnant or trying to get  pregnant Breast-feeding  When to use this medication? This medication is to be used for the treatment of respiratory depression (less than 8 breaths per minute) secondary to opioid overdose.   How to use this medication? This medication is for use in the nose. Lay the person on their back. Support their neck with your hand and allow the head to tilt back before giving the medication. The nasal spray should be given into 1 nostril. After giving the medication, move the person onto their side. Do not remove or test the nasal spray until ready to use. Get emergency medical help right away after giving the first dose of this medication, even if the person wakes up. You should be familiar with how  to recognize the signs and symptoms of a narcotic overdose. If more doses are needed, give the additional dose in the other nostril. Talk to your care team about the use of this medication in children. While this medication may be prescribed for children as young as newborns for selected conditions, precautions do apply.  Naloxone Overdosage: If you think you have taken too much of this medicine contact a poison control center or emergency room at once.  NOTE: This medicine is only for you. Do not share this medicine with others.  What if I miss a dose? This does not apply.  What may interact with this medication? This is only used during an emergency. No interactions are expected during emergency use. This list may not describe all possible interactions. Give your health care provider a list of all the medicines, herbs, non-prescription drugs, or dietary supplements you use. Also tell them if you smoke, drink alcohol, or use illegal drugs. Some items may interact with your medicine.  What should I watch for while using this medication? Keep this medication ready for use in the case of an opioid overdose. Make sure that you have the phone number of your care team and local hospital ready. You may need to  have additional doses of this medication. Each nasal spray contains a single dose. Some emergencies may require additional doses. After use, bring the treated person to the nearest hospital or call 911. Make sure the treating care team knows that the person has received a dose of this medication. You will receive additional instructions on what to do during and after use of this medication before an emergency occurs.  What side effects may I notice from receiving this medication? Side effects that you should report to your care team as soon as possible: Allergic reactions--skin rash, itching, hives, swelling of the face, lips, tongue, or throat Side effects that usually do not require medical attention (report these to your care team if they continue or are bothersome): Constipation Dryness or irritation inside the nose Headache Increase in blood pressure Muscle spasms Stuffy nose Toothache This list may not describe all possible side effects. Call your doctor for medical advice about side effects. You may report side effects to FDA at 1-800-FDA-1088.  Where should I keep my medication? Because this is an emergency medication, you should keep it with you at all times.  Keep out of the reach of children and pets. Store between 20 and 25 degrees C (68 and 77 degrees F). Do not freeze. Throw away any unused medication after the expiration date. Keep in original box until ready to use.  NOTE: This sheet is a summary. It may not cover all possible information. If you have questions about this medicine, talk to your doctor, pharmacist, or health care provider.   2023 Elsevier/Gold Standard (2021-04-06 00:00:00)  ____________________________________________________________________________________________

## 2022-10-29 ENCOUNTER — Ambulatory Visit: Payer: Medicare Other | Attending: Pain Medicine | Admitting: Pain Medicine

## 2022-10-29 ENCOUNTER — Encounter: Payer: Self-pay | Admitting: Pain Medicine

## 2022-10-29 ENCOUNTER — Ambulatory Visit
Admission: RE | Admit: 2022-10-29 | Discharge: 2022-10-29 | Disposition: A | Discharge status: Home / Self Care | Payer: Medicare Other | Source: Ambulatory Visit | Attending: Pain Medicine | Admitting: Pain Medicine

## 2022-10-29 VITALS — BP 140/69 | HR 80 | Temp 97.2°F | Resp 16 | Ht 65.0 in | Wt 175.0 lb

## 2022-10-29 DIAGNOSIS — M7918 Myalgia, other site: Secondary | ICD-10-CM | POA: Diagnosis not present

## 2022-10-29 DIAGNOSIS — Z91041 Radiographic dye allergy status: Secondary | ICD-10-CM | POA: Diagnosis not present

## 2022-10-29 DIAGNOSIS — G588 Other specified mononeuropathies: Secondary | ICD-10-CM | POA: Diagnosis not present

## 2022-10-29 DIAGNOSIS — M545 Low back pain, unspecified: Secondary | ICD-10-CM

## 2022-10-29 DIAGNOSIS — M5414 Radiculopathy, thoracic region: Secondary | ICD-10-CM | POA: Diagnosis not present

## 2022-10-29 DIAGNOSIS — Z87892 Personal history of anaphylaxis: Secondary | ICD-10-CM | POA: Insufficient documentation

## 2022-10-29 DIAGNOSIS — R109 Unspecified abdominal pain: Secondary | ICD-10-CM | POA: Diagnosis not present

## 2022-10-29 DIAGNOSIS — S2232XS Fracture of one rib, left side, sequela: Secondary | ICD-10-CM | POA: Diagnosis not present

## 2022-10-29 DIAGNOSIS — M5459 Other low back pain: Secondary | ICD-10-CM | POA: Diagnosis not present

## 2022-10-29 DIAGNOSIS — M549 Dorsalgia, unspecified: Secondary | ICD-10-CM | POA: Insufficient documentation

## 2022-10-29 DIAGNOSIS — Z8781 Personal history of (healed) traumatic fracture: Secondary | ICD-10-CM | POA: Insufficient documentation

## 2022-10-29 DIAGNOSIS — G8929 Other chronic pain: Secondary | ICD-10-CM | POA: Diagnosis not present

## 2022-10-29 MED ORDER — LACTATED RINGERS IV SOLN: Freq: Once | INTRAVENOUS | Status: DC

## 2022-10-29 MED ORDER — DEXAMETHASONE SODIUM PHOSPHATE 10 MG/ML IJ SOLN
INTRAMUSCULAR | Status: AC
  Filled 2022-10-29: qty 1

## 2022-10-29 MED ORDER — TRIAMCINOLONE ACETONIDE 40 MG/ML IJ SUSP
40.0000 mg | Freq: Once | INTRAMUSCULAR | Status: AC
  Administered 2022-10-29: 40 mg

## 2022-10-29 MED ORDER — PENTAFLUOROPROP-TETRAFLUOROETH EX AERO: INHALATION_SPRAY | Freq: Once | CUTANEOUS | Status: DC

## 2022-10-29 MED ORDER — LIDOCAINE HCL 2 % IJ SOLN
20.0000 mL | Freq: Once | INTRAMUSCULAR | Status: AC
  Administered 2022-10-29: 400 mg

## 2022-10-29 MED ORDER — MIDAZOLAM HCL 2 MG/2ML IJ SOLN: 0.5000 mg | Freq: Once | INTRAMUSCULAR | Status: DC

## 2022-10-29 MED ORDER — TRIAMCINOLONE ACETONIDE 40 MG/ML IJ SUSP
INTRAMUSCULAR | Status: AC
  Filled 2022-10-29: qty 1

## 2022-10-29 MED ORDER — ROPIVACAINE HCL 2 MG/ML IJ SOLN
INTRAMUSCULAR | Status: AC
  Filled 2022-10-29: qty 20

## 2022-10-29 MED ORDER — LIDOCAINE HCL 2 % IJ SOLN
INTRAMUSCULAR | Status: AC
  Filled 2022-10-29: qty 20

## 2022-10-29 MED ORDER — ROPIVACAINE HCL 2 MG/ML IJ SOLN
9.0000 mL | Freq: Once | INTRAMUSCULAR | Status: AC
  Administered 2022-10-29: 9 mL

## 2022-10-29 MED ORDER — ROPIVACAINE HCL 2 MG/ML IJ SOLN
9.0000 mL | Freq: Once | INTRAMUSCULAR | Status: AC
  Administered 2022-10-29: 9 mL via PERINEURAL

## 2022-10-29 MED ORDER — DEXAMETHASONE SODIUM PHOSPHATE 10 MG/ML IJ SOLN
10.0000 mg | Freq: Once | INTRAMUSCULAR | Status: AC
  Administered 2022-10-29: 10 mg

## 2022-10-29 NOTE — Patient Instructions (Addendum)
GO TO THE EMERGENCY ROOM IF YOU HAVE ANY SHORTNESS OF BREATH. Pain Management Discharge Instructions  General Discharge Instructions :  If you need to reach your doctor call: Monday-Friday 8:00 am - 4:00 pm at [phone] or toll free [phone].  After clinic hours [phone] to have operator reach doctor.  Bring all of your medication bottles to all your appointments in the pain clinic.  To cancel or reschedule your appointment with Pain Management please remember to call 24 hours in advance to avoid a fee.  Refer to the educational materials which you have been given on: General Risks, I had my Procedure. Discharge Instructions, Post Sedation.  Post Procedure Instructions:  The drugs you were given will stay in your system until tomorrow, so for the next 24 hours you should not drive, make any legal decisions or drink any alcoholic beverages.  You may eat anything you prefer, but it is better to start with liquids then soups and crackers, and gradually work up to solid foods.  Please notify your doctor immediately if you have any unusual bleeding, trouble breathing or pain that is not related to your normal pain.  Depending on the type of procedure that was done, some parts of your body may feel week and/or numb.  This usually clears up by tonight or the next day.  Walk with the use of an assistive device or accompanied by an adult for the 24 hours.  You may use ice on the affected area for the first 24 hours.  Put ice in a Ziploc bag and cover with a towel and place against area 15 minutes on 15 minutes off.  You may switch to heat after 24 hours. ____________________________________________________________________________________________  Post-Procedure Discharge Instructions  Instructions: Apply ice:  Purpose: This will minimize any swelling and discomfort after procedure.  When: Day of procedure, as soon as you get home. How: Fill a plastic sandwich bag with crushed  ice. Cover it with a small towel and apply to injection site. How long: (15 min on, 15 min off) Apply for 15 minutes then remove x 15 minutes.  Repeat sequence on day of procedure, until you go to bed. Apply heat:  Purpose: To treat any soreness and discomfort from the procedure. When: Starting the next day after the procedure. How: Apply heat to procedure site starting the day following the procedure. How long: May continue to repeat daily, until discomfort goes away. Food intake: Start with clear liquids (like water) and advance to regular food, as tolerated.  Physical activities: Keep activities to a minimum for the first 8 hours after the procedure. After that, then as tolerated. Driving: If you have received any sedation, be responsible and do not drive. You are not allowed to drive for 24 hours after having sedation. Blood thinner: (Applies only to those taking blood thinners) You may restart your blood thinner 6 hours after your procedure. Insulin: (Applies only to Diabetic patients taking insulin) As soon as you can eat, you may resume your normal dosing schedule. Infection prevention: Keep procedure site clean and dry. Shower daily and clean area with soap and water. Post-procedure Pain Diary: Extremely important that this be done correctly and accurately. Recorded information will be used to determine the next step in treatment. For the purpose of accuracy, follow these rules: Evaluate only the area treated. Do not report or include pain from an untreated area. For the purpose of this evaluation, ignore all other areas of pain, except for the treated area. After  your procedure, avoid taking a long nap and attempting to complete the pain diary after you wake up. Instead, set your alarm clock to go off every hour, on the hour, for the initial 8 hours after the procedure. Document the duration of the numbing medicine, and the relief you are getting from it. Do not go to sleep and attempt to  complete it later. It will not be accurate. If you received sedation, it is likely that you were given a medication that may cause amnesia. Because of this, completing the diary at a later time may cause the information to be inaccurate. This information is needed to plan your care. Follow-up appointment: Keep your post-procedure follow-up evaluation appointment after the procedure (usually 2 weeks for most procedures, 6 weeks for radiofrequencies). DO NOT FORGET to bring you pain diary with you.   Expect: (What should I expect to see with my procedure?) From numbing medicine (AKA: Local Anesthetics): Numbness or decrease in pain. You may also experience some weakness, which if present, could last for the duration of the local anesthetic. Onset: Full effect within 15 minutes of injected. Duration: It will depend on the type of local anesthetic used. On the average, 1 to 8 hours.  From steroids (Applies only if steroids were used): Decrease in swelling or inflammation. Once inflammation is improved, relief of the pain will follow. Onset of benefits: Depends on the amount of swelling present. The more swelling, the longer it will take for the benefits to be seen. In some cases, up to 10 days. Duration: Steroids will stay in the system x 2 weeks. Duration of benefits will depend on multiple posibilities including persistent irritating factors. Side-effects: If present, they may typically last 2 weeks (the duration of the steroids). Frequent: Cramps (if they occur, drink Gatorade and take over-the-counter Magnesium 450-500 mg once to twice a day); water retention with temporary weight gain; increases in blood sugar; decreased immune system response; increased appetite. Occasional: Facial flushing (red, warm cheeks); mood swings; menstrual changes. Uncommon: Long-term decrease or suppression of natural hormones; bone thinning. (These are more common with higher doses or more frequent use. This is why we  prefer that our patients avoid having any injection therapies in other practices.)  Very Rare: Severe mood changes; psychosis; aseptic necrosis. From procedure: Some discomfort is to be expected once the numbing medicine wears off. This should be minimal if ice and heat are applied as instructed.  Call if: (When should I call?) You experience numbness and weakness that gets worse with time, as opposed to wearing off. New onset bowel or bladder incontinence. (Applies only to procedures done in the spine)  Emergency Numbers: Durning business hours (Monday - Thursday, 8:00 AM - 4:00 PM) (Friday, 9:00 AM - 12:00 Noon): [phone] After hours: [phone] NOTE: If you are having a problem and are unable connect with, or to talk to a provider, then go to your nearest urgent care or emergency department. If the problem is serious and urgent, please call 911. ____________________________________________________________________________________________

## 2022-10-29 NOTE — Progress Notes (Signed)
PROVIDER NOTE: Interpretation of information contained herein should be left to medically-trained personnel. Specific patient instructions are provided elsewhere under "Patient Instructions" section of medical record. This document was created in part using STT-dictation technology, any transcriptional errors that may result from this process are unintentional.  Patient: Kari Gibbs Type: Established DOB: 1950/10/09 MRN: [id number] PCP: Cari Caraway, MD  Service: Procedure DOS: 10/29/2022 Setting: Ambulatory Location: Ambulatory outpatient facility Delivery: Face-to-face Provider: Gaspar Cola, MD Specialty: Interventional Pain Management Specialty designation: 09 Location: Outpatient facility Ref. Prov.: Cari Caraway, MD       Interventional Therapy    Procedure #1:          Anesthesia, Analgesia, Anxiolysis:  Type: Therapeutic Posterolateral Intercostal  Nerve Block  #2  Region: Posterolateral  Thoracic Area Level: Eighth (8th) rib Laterality: Left  Anesthesia: Local (1-2% Lidocaine)  Anxiolysis: None  Sedation: None  Guidance: Fluoroscopy           Position: Lateral decubitus, tilted anteriorly (approx. 45 debrees), w/ the painful side up   1. Chronic flank pain (1ry area of Pain) (Left)   2. Chronic thoracic radicular pain (Left-sided)   3. Closed fracture of one rib of left side, sequela   4. History of rib fracture (4th and 8th rib) (Left)   5. 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 Laterality: Left        Type: Local Anesthesia Local Anesthetic: Lidocaine 1-2% Sedation: None  Indication(s):  Analgesia Route:  Infiltration ([place]/IM) IV Access: Declined   Position: Right lateral decubitus in a 45 degree anterior tilt.   1. Chronic low back pain (3ry area of Pain) (Bilateral) w/o sciatica   2. Trigger point with back pain (Left)   3. Lumbar trigger point syndrome (Left)    NAS-11 Pain score:   Pre-procedure: 4 /10   Post-procedure: 4 /10     Pre-op H&P Assessment:  Kari Gibbs is a 72 y.o. (year old), female patient, seen today for interventional treatment. 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 Kari Gibbs has a current medication list which includes the following prescription(s): acetaminophen, benzonatate, biotin, bupropion, buspirone, vitamin d3, cranberry, diclofenac sodium, esomeprazole, estradiol, eszopiclone, fluoxetine, irbesartan, vitron-c, lansoprazole, loratadine, lorazepam, magnesium, montelukast, morphine, [START ON 11/28/2022] morphine, [START ON 12/28/2022] morphine, naloxone, peppermint oil, systane ultra, potassium gluconate, prenatal mv & min w/fa-dha, ra probiotic gummies, sucralfate, vitamin b-12, and benefiber, and the following Facility-Administered Medications: pentafluoroprop-tetrafluoroeth. Her primarily concern today is the Abdominal Pain  Initial Vital Signs:  Pulse/HCG Rate: 80ECG Heart Rate: 85 Temp: (!) 97.2 F (36.2 C) Resp: 16 BP: (!) 151/86 SpO2: 98 %  BMI: Estimated body  mass index is 29.12 kg/m as calculated from the following:   Height as of this encounter: 5\' 5"  (1.651 m).   Weight as of this encounter:  175 lb (79.4 kg).  Risk Assessment: Allergies: Reviewed. She is allergic to iohexol, ace inhibitors, losartan potassium, neosporin [neomycin-bacitracin zn-polymyx], and bacitracin-polymyxin b.  Allergy Precautions: None required Coagulopathies: Reviewed. None identified.  Blood-thinner therapy: None at this time Active Infection(s): Reviewed. None identified. Kari Gibbs is afebrile  Site Confirmation: Kari Gibbs was asked to confirm the procedure and laterality before marking the site Procedure checklist: Completed Consent: Before the procedure and under the influence of no sedative(s), amnesic(s), or anxiolytics, the patient was informed of the treatment options, risks and possible complications. To fulfill our ethical and legal obligations, as recommended by the American Medical Association's Code of Ethics, I have informed the patient of my clinical impression; the nature and purpose of the treatment or procedure; the risks, benefits, and possible complications of the intervention; the alternatives, including doing nothing; the risk(s) and benefit(s) of the alternative treatment(s) or procedure(s); and the risk(s) and benefit(s) of doing nothing. The patient was provided information about the general risks and possible complications associated with the procedure. These may include, but are not limited to: failure to achieve desired goals, infection, bleeding, organ or nerve damage, allergic reactions, paralysis, and death. In addition, the patient was informed of those risks and complications associated to the procedure, such as failure to decrease pain; infection; bleeding; organ or nerve damage with subsequent damage to sensory, motor, and/or autonomic systems, resulting in permanent pain, numbness, and/or weakness of one or several areas of the body;  allergic reactions; (i.e.: anaphylactic reaction); and/or death. Furthermore, the patient was informed of those risks and complications associated with the medications. These include, but are not limited to: allergic reactions (i.e.: anaphylactic or anaphylactoid reaction(s)); adrenal axis suppression; blood sugar elevation that in diabetics may result in ketoacidosis or comma; water retention that in patients with history of congestive heart failure may result in shortness of breath, pulmonary edema, and decompensation with resultant heart failure; weight gain; swelling or edema; medication-induced neural toxicity; particulate matter embolism and blood vessel occlusion with resultant organ, and/or nervous system infarction; and/or aseptic necrosis of one or more joints. Finally, the patient was informed that Medicine is not an exact science; therefore, there is also the possibility of unforeseen or unpredictable risks and/or possible complications that may result in a catastrophic outcome. The patient indicated having understood very clearly. We have given the patient no guarantees and we have made no promises. Enough time was given to the patient to ask questions, all of which were answered to the patient's satisfaction. Ms. Fitting has indicated that she wanted to continue with the procedure. Attestation: I, the ordering provider, attest that I have discussed with the patient the benefits, risks, side-effects, alternatives, likelihood of achieving goals, and potential problems during recovery for the procedure that I have provided informed consent. Date  Time: 10/29/2022 12:10 PM  Pre-Procedure Preparation:  Monitoring: As per clinic protocol. Respiration, ETCO2, SpO2, BP, heart rate and rhythm monitor placed and checked for adequate function Safety Precautions: Patient was assessed for positional comfort and pressure points before starting the procedure. Time-out: I initiated and conducted the  "Time-out" before starting the procedure, as per protocol. The patient was asked to participate by confirming the accuracy of the "Time Out" information. Verification of the correct person, site, and procedure were performed and confirmed by me, the nursing staff, and the patient. "Time-out" conducted as per Joint Commission's Universal Protocol (UP.01.01.01). Time: 1237  Description of Procedure #1:  Start Time: [id number] hrs. Target Area: The sub-costal neurovascular  bundle area Approach: Sub-costal approach Area Prepped: Entire Posterolateral  Thoracic Region DuraPrep (Iodine Povacrylex [0.7% available iodine] and Isopropyl Alcohol, 74% w/w) Safety Precautions: Aspiration looking for blood return was conducted prior to all injections. At no point did we inject any substances, as a needle was being advanced. No attempts were made at seeking any paresthesias. Safe injection practices and needle disposal techniques used. Medications properly checked for expiration dates. SDV (single dose vial) medications used. Description of the Procedure: Protocol guidelines were followed. The patient was placed in position over the procedure table. The target area was identified and the area prepped in the usual manner. Skin & deeper tissues infiltrated with local anesthetic. After cleaning the skin with an antiseptic solution, 1-2 mL of dilute local anesthetic was infiltrated subcutaneously at the planned injection site. The fingers of the palpating hand were used to straddle the insertion site at the inferior border of the rib and fix the skin to avoid unwanted skin movement. Appropriate amount of time allowed to pass for local anesthetics to take effect. The procedure needles were then advanced to the target area at an angle of approximately 20 cephalad to the skin. Contact with the rib was made. While maintaining the same angle of insertion, the needle was walked off the inferior border of the rib as the skin was  allowed to return to its initial position. Then the needle was advanced no more than 3 mm below the inferior margin of the rib. Proper needle placement was secured. Negative aspiration confirmed. Following negative aspiration for blood or air, 3-5 mL of local anesthetic was injected. The solution injected in intermittent fashion, asking for systemic symptoms every 0.5cc of injectate. The needle was then removed and the area cleansed, making sure to leave some of the prepping solution back to take advantage of its long term bactericidal properties.     Materials:  Needle(s) Type: Regular needle Gauge: 22G Length: 1.5-in Medication(s): Please see orders for medications and dosing details.  Imaging Guidance (Non-Spinal):          Type of Imaging Technique: Fluoroscopy Guidance (Non-Spinal) Indication(s): Assistance in needle guidance and placement for procedures requiring needle placement in or near specific anatomical locations not easily accessible without such assistance. Exposure Time: Please see nurses notes. Contrast: None used. Fluoroscopic Guidance: I was personally present during the use of fluoroscopy. "Tunnel Vision Technique" used to obtain the best possible view of the target area. Parallax error corrected before commencing the procedure. "Direction-depth-direction" technique used to introduce the needle under continuous pulsed fluoroscopy. Once target was reached, antero-posterior, oblique, and lateral fluoroscopic projection used confirm needle placement in all planes. Images permanently stored in EMR. Interpretation: No contrast injected. I personally interpreted the imaging intraoperatively. Adequate needle placement confirmed in multiple planes. Permanent images saved into the patient's record.  Description of Procedure #2:  Area Prepped: Entire Lower Lumbar Region DuraPrep (Iodine Povacrylex [0.7% available iodine] and Isopropyl Alcohol, 74% w/w) Safety Precautions: Aspiration  looking for blood return was conducted prior to all injections. At no point did we inject any substances, as a needle was being advanced. No attempts were made at seeking any paresthesias. Safe injection practices and needle disposal techniques used. Medications properly checked for expiration dates. SDV (single dose vial) medications used. Description of the Procedure: Protocol guidelines were followed. The patient was placed in position over the fluoroscopy table. The target area was identified and the area prepped in the usual manner. Skin & deeper tissues infiltrated with local  anesthetic. Appropriate amount of time allowed to pass for local anesthetics to take effect. The procedure needles were then advanced to the target area. Proper needle placement secured. Negative aspiration confirmed. Solution injected in intermittent fashion, asking for systemic symptoms every 0.5cc of injectate. The needles were then removed and the area cleansed, making sure to leave some of the prepping solution back to take advantage of its long term bactericidal properties.   Vitals:   10/29/22 1210 10/29/22 1236 10/29/22 1241 10/29/22 1245  BP: (!) 151/86 (!) 146/64 (!) 145/67 (!) 140/69  Pulse: 80     Resp: 16 16 18 16   Temp: (!) 97.2 F (36.2 C)     SpO2: 98% 99% 100% 100%  Weight: 175 lb (79.4 kg)     Height: 5\' 5"  (1.651 m)        End Time: 1244 hrs. Materials:  Needle(s) Type: Regular needle Gauge: 25G Length: 1.5-in Medication(s): Please see orders for medications and dosing details.  Imaging Guidance for procedure #2:  Type of Imaging Technique: None used Indication(s): N/A Exposure Time: No patient exposure Contrast: None used. Fluoroscopic Guidance: N/A Ultrasound Guidance: N/A Interpretation: N/A  Antibiotic Prophylaxis:   Anti-infectives (From admission, onward)    None      Indication(s): None identified  Post-operative Assessment:  Post-procedure Vital Signs:  Pulse/HCG Rate:  8080 Temp: (!) 97.2 F (36.2 C) Resp: 16 BP: (!) 140/69 SpO2: 100 %  EBL: None  Complications: No immediate post-treatment complications observed by team, or reported by patient.  Note: The patient tolerated the entire procedure well. A repeat set of vitals were taken after the procedure and the patient was kept under observation following institutional policy, for this type of procedure. Post-procedural neurological assessment was performed, showing return to baseline, prior to discharge. The patient was provided with post-procedure discharge instructions, including a section on how to identify potential problems. Should any problems arise concerning this procedure, the patient was given instructions to immediately contact us, at any time, without hesitation. In any case, we plan to contact the patient by telephone for a follow-up status report regarding this interventional procedure.  Comments:  No additional relevant information.  Plan of Care (POC)  Orders:  Orders Placed This Encounter  Procedures   INTERCOSTAL NERVE BLOCK    Scheduling Instructions:     Procedure: Intercostal nerve block (ICNB)     Laterality: Left-sided     Sedation: Patient's choice.     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    Answer:   [facility]   TRIGGER POINT INJECTION    Scheduling Instructions:     Area: Lower Back     Side: Left     Sedation: Patient's choice.     Timeframe: Today    Order Specific Question:   Where will this procedure be performed?    Answer:   [facility]   DG PAIN CLINIC C-ARM 1-60 MIN NO REPORT    Intraoperative interpretation by procedural physician at [place].    Standing Status:   Standing    Number of Occurrences:   1    Order Specific Question:   Reason for exam:    Answer:   Assistance in needle  guidance and placement for procedures requiring needle placement in or near specific anatomical locations not easily  accessible without such assistance.   Informed Consent Details: Physician/Practitioner Attestation; Transcribe to consent form and obtain patient signature    Nursing Order: Transcribe to consent form and obtain patient signature. Note: Always confirm laterality of pain with Ms. Izora Ribas, before procedure.    Order Specific Question:   Physician/Practitioner attestation of informed consent for procedure/surgical case    Answer:   I, the physician/practitioner, attest that I have discussed with the patient the benefits, risks, side effects, alternatives, likelihood of achieving goals and potential problems during recovery for the procedure that I have provided informed consent.    Order Specific Question:   Procedure    Answer:   Left eighth rib intercostal nerve block    Order Specific Question:   Physician/Practitioner performing the procedure    Answer:   Morayo Leven A. Dossie Arbour, MD    Order Specific Question:   Indication/Reason    Answer:   Intercostal neuralgia   Provide equipment / supplies at bedside    Procedure tray: "Block Tray" (Disposable  single use) Skin infiltration needle: Regular 1.5-in, 25-G, (x1) Block Needle type: Regular Amount/quantity: 1 Size: Short(1.5-inch) Gauge: 22G    Standing Status:   Standing    Number of Occurrences:   1    Order Specific Question:   Specify    Answer:   Block Tray   Informed Consent Details: Physician/Practitioner Attestation; Transcribe to consent form and obtain patient signature    Provider Attestation: I, Ackerly. Dossie Arbour, MD, (Pain Management Specialist), the physician/practitioner, attest that I have discussed with the patient the benefits, risks, side effects, alternatives, likelihood of achieving goals and potential problems during recovery for the procedure that I have provided informed consent.    Scheduling  Instructions:     Note: Always confirm laterality of pain with Ms. Izora Ribas, before procedure.     Transcribe to consent form and obtain patient signature.    Order Specific Question:   Physician/Practitioner attestation of informed consent for procedure/surgical case    Answer:   I, the physician/practitioner, attest that I have discussed with the patient the benefits, risks, side effects, alternatives, likelihood of achieving goals and potential problems during recovery for the procedure that I have provided informed consent.    Order Specific Question:   Procedure    Answer:   Myoneural Block (Trigger Point injection)    Order Specific Question:   Physician/Practitioner performing the procedure    Answer:   Tijuana Scheidegger A. Dossie Arbour MD    Order Specific Question:   Indication/Reason    Answer:   Musculoskeletal pain/myofascial pain secondary to trigger point   Chronic Opioid Analgesic:  MS IR 15 mg mg, 1 tab PO q 8 hrs (45 mg/day of morphine) MME/day: 45 mg/day.   Medications ordered for procedure: Meds ordered this encounter  Medications   lidocaine (XYLOCAINE) 2 % (with pres) injection 400 mg   pentafluoroprop-tetrafluoroeth (GEBAUERS) aerosol   DISCONTD: lactated ringers infusion   DISCONTD: midazolam (VERSED) injection 0.5-2 mg    Make sure Flumazenil is available in the pyxis when using this medication. If oversedation occurs, administer 0.2 mg IV over 15 sec. If after 45 sec no response, administer 0.2 mg again over 1 min; may repeat at 1 min intervals; not to exceed 4 doses (1 mg)   ropivacaine (PF) 2 mg/mL (0.2%) (NAROPIN) injection 9 mL   dexamethasone (DECADRON) injection 10 mg   ropivacaine (PF) 2 mg/mL (0.2%) (NAROPIN) injection 9 mL   triamcinolone acetonide (KENALOG-40) injection 40 mg  Medications administered: We administered lidocaine, ropivacaine (PF) 2 mg/mL (0.2%), dexamethasone, ropivacaine (PF) 2 mg/mL (0.2%), and triamcinolone acetonide.  See the medical record  for exact dosing, route, and time of administration.  Follow-up plan:   Return in about 2 weeks (around 11/12/2022) for Proc-day (T,Th), (Face2F), (PPE).       Interventional Therapies  Risk Factors  Considerations:   ALLERGY: Contrast Dye (Anaphylactic - Type)    Planned  Pending:      Under consideration:   Diagnostic left costochondral joint injection  Diagnostic left C2 + TON NB    Completed:   Therapeutic left 8th rib intercostal NB x1 (09/10/2022)  Palliative left intercostal NB (T5 to T8) x1 (03/03/2017)  Palliative left GONB Blk x2 (07/17/2016)  Palliative bilateral SI Blk x1 (04/02/2018)    Therapeutic  Palliative (PRN) options:   Palliative left intercostal NB (T5 to T8) #2  Palliative left GONB Blk #3  Palliative bilateral SI Blk #2    Pharmacotherapy  Nonopioids transferred 05/29/2020: Gabapentin and Voltaren gel       Recent Visits Date Type Provider Dept  10/23/22 Office Visit [name], MD [facility]  10/03/22 Office Visit [name], MD [facility]  09/10/22 Procedure visit [name], MD [facility]  Showing recent visits within past 90 days and meeting all other requirements Today's Visits Date Type Provider Dept  10/29/22 Procedure visit [name], MD [facility]  Showing today's visits and meeting all other requirements Future Appointments Date Type Provider Dept  11/19/22 Appointment [name], MD [facility]  01/22/23 Appointment [name], MD [facility]  Showing future appointments within next 90 days and meeting all other requirements  Disposition: Discharge home  Discharge (Date  Time): 10/29/2022; 1255 hrs.   Primary Care Physician: Cari Caraway, MD Location: [facility] Note by: Gaspar Cola, MD (TTS technology used. I apologize for any typographical errors that were not detected and  corrected.) Date: 10/29/2022; Time: 12:52 PM  Disclaimer:  Medicine is not an Chief Strategy Officer. The only guarantee in medicine is that nothing is guaranteed. It is important to note that the decision to proceed with this intervention was based on the information collected from the patient. The Data and conclusions were drawn from the patient's questionnaire, the interview, and the physical examination. Because the information was provided in large part by the patient, it cannot be guaranteed that it has not been purposely or unconsciously manipulated. Every effort has been made to obtain as much relevant data as possible for this evaluation. It is important to note that the conclusions that lead to this procedure are derived in large part from the available data. Always take into account that the treatment will also be dependent on availability of resources and existing treatment guidelines, considered by other Pain Management Practitioners as being common knowledge and practice, at the time of the intervention. For Medico-Legal purposes, it is also important to point out that variation in procedural techniques and pharmacological choices are the acceptable norm. The indications, contraindications, technique, and results of the above procedure should only be interpreted and judged by a Board-Certified Interventional Pain Specialist with extensive familiarity and expertise in the same exact procedure and technique.

## 2022-10-29 NOTE — Progress Notes (Signed)
Safety precautions to be maintained throughout the outpatient stay will include: orient to surroundings, keep bed in low position, maintain call bell within reach at all times, provide assistance with transfer out of bed and ambulation.  

## 2022-10-30 ENCOUNTER — Telehealth: Payer: Self-pay

## 2022-10-30 NOTE — Telephone Encounter (Signed)
Post procedure follow up.  Patient states she is doing good.  

## 2022-11-06 DIAGNOSIS — K219 Gastro-esophageal reflux disease without esophagitis: Secondary | ICD-10-CM | POA: Diagnosis not present

## 2022-11-06 DIAGNOSIS — G8928 Other chronic postprocedural pain: Secondary | ICD-10-CM | POA: Diagnosis not present

## 2022-11-06 DIAGNOSIS — E538 Deficiency of other specified B group vitamins: Secondary | ICD-10-CM | POA: Diagnosis not present

## 2022-11-06 DIAGNOSIS — K911 Postgastric surgery syndromes: Secondary | ICD-10-CM | POA: Diagnosis not present

## 2022-11-06 DIAGNOSIS — L659 Nonscarring hair loss, unspecified: Secondary | ICD-10-CM | POA: Diagnosis not present

## 2022-11-06 DIAGNOSIS — M810 Age-related osteoporosis without current pathological fracture: Secondary | ICD-10-CM | POA: Diagnosis not present

## 2022-11-06 DIAGNOSIS — I10 Essential (primary) hypertension: Secondary | ICD-10-CM | POA: Diagnosis not present

## 2022-11-06 DIAGNOSIS — R110 Nausea: Secondary | ICD-10-CM | POA: Diagnosis not present

## 2022-11-19 ENCOUNTER — Ambulatory Visit (HOSPITAL_BASED_OUTPATIENT_CLINIC_OR_DEPARTMENT_OTHER): Payer: Medicare Other | Admitting: Pain Medicine

## 2022-11-19 DIAGNOSIS — Z91199 Patient's noncompliance with other medical treatment and regimen due to unspecified reason: Secondary | ICD-10-CM

## 2022-11-19 NOTE — Patient Instructions (Signed)
____________________________________________________________________________________________  Opioid Pain Medication Update  To: All patients taking opioid pain medications. (I.e.: hydrocodone, hydromorphone, oxycodone, oxymorphone, morphine, codeine, methadone, tapentadol, tramadol, buprenorphine, fentanyl, etc.)  Re: Updated review of side effects and adverse reactions of opioid analgesics, as well as new information about long term effects of this class of medications.  Direct risks of long-term opioid therapy are not limited to opioid addiction and overdose. Potential medical risks include serious fractures, breathing problems during sleep, hyperalgesia, immunosuppression, chronic constipation, bowel obstruction, myocardial infarction, and tooth decay secondary to xerostomia.  Unpredictable adverse effects that can occur even if you take your medication correctly: Cognitive impairment, respiratory depression, and death. Most people think that if they take their medication "correctly", and "as instructed", that they will be safe. Nothing could be farther from the truth. In reality, a significant amount of recorded deaths associated with the use of opioids has occurred in individuals that had taken the medication for a long time, and were taking their medication correctly. The following are examples of how this can happen: Patient taking his/her medication for a long time, as instructed, without any side effects, is given a certain antibiotic or another unrelated medication, which in turn triggers a "Drug-to-drug interaction" leading to disorientation, cognitive impairment, impaired reflexes, respiratory depression or an untoward event leading to serious bodily harm or injury, including death.  Patient taking his/her medication for a long time, as instructed, without any side effects, develops an acute impairment of liver and/or kidney function. This will lead to a rapid inability of the body to  breakdown and eliminate their pain medication, which will result in effects similar to an "overdose", but with the same medicine and dose that they had always taken. This again may lead to disorientation, cognitive impairment, impaired reflexes, respiratory depression or an untoward event leading to serious bodily harm or injury, including death.  A similar problem will occur with patients as they grow older and their liver and kidney function begins to decrease as part of the aging process.  Background information: Historically, the original case for using long-term opioid therapy to treat chronic noncancer pain was based on safety assumptions that subsequent experience has called into question. In 1996, the American Pain Society and the American Academy of Pain Medicine issued a consensus statement supporting long-term opioid therapy. This statement acknowledged the dangers of opioid prescribing but concluded that the risk for addiction was low; respiratory depression induced by opioids was short-lived, occurred mainly in opioid-naive patients, and was antagonized by pain; tolerance was not a common problem; and efforts to control diversion should not constrain opioid prescribing. This has now proven to be wrong. Experience regarding the risks for opioid addiction, misuse, and overdose in community practice has failed to support these assumptions.  According to the Centers for Disease Control and Prevention, fatal overdoses involving opioid analgesics have increased sharply over the past decade. Currently, more than 96,700 people die from drug overdoses every year. Opioids are a factor in 7 out of every 10 overdose deaths. Deaths from drug overdose have surpassed motor vehicle accidents as the leading cause of death for individuals between the ages of 35 and 54.  Clinical data suggest that neuroendocrine dysfunction may be very common in both men and women, potentially causing hypogonadism, erectile  dysfunction, infertility, decreased libido, osteoporosis, and depression. Recent studies linked higher opioid dose to increased opioid-related mortality. Controlled observational studies reported that long-term opioid therapy may be associated with increased risk for cardiovascular events. Subsequent meta-analysis concluded   that the safety of long-term opioid therapy in elderly patients has not been proven.   Side Effects and adverse reactions: Common side effects: Drowsiness (sedation). Dizziness. Nausea and vomiting. Constipation. Physical dependence -- Dependence often manifests with withdrawal symptoms when opioids are discontinued or decreased. Tolerance -- As you take repeated doses of opioids, you require increased medication to experience the same effect of pain relief. Respiratory depression -- This can occur in healthy people, especially with higher doses. However, people with COPD, asthma or other lung conditions may be even more susceptible to fatal respiratory impairment.  Uncommon side effects: An increased sensitivity to feeling pain and extreme response to pain (hyperalgesia). Chronic use of opioids can lead to this. Delayed gastric emptying (the process by which the contents of your stomach are moved into your small intestine). Muscle rigidity. Immune system and hormonal dysfunction. Quick, involuntary muscle jerks (myoclonus). Arrhythmia. Itchy skin (pruritus). Dry mouth (xerostomia).  Long-term side effects: Chronic constipation. Sleep-disordered breathing (SDB). Increased risk of bone fractures. Hypothalamic-pituitary-adrenal dysregulation. Increased risk of overdose.  RISKS: Fractures and Falls:  Opioids increase the risk and incidence of falls. This is of particular importance in elderly patients.  Endocrine System:  Long-term administration is associated with endocrine abnormalities (endocrinopathies). (Also known as Opioid-induced Endocrinopathy) Influences  on both the hypothalamic-pituitary-adrenal axis?and the hypothalamic-pituitary-gonadal axis have been demonstrated with consequent hypogonadism and adrenal insufficiency in both sexes. Hypogonadism and decreased levels of dehydroepiandrosterone sulfate have been reported in men and women. Endocrine effects include: Amenorrhoea in women (abnormal absence of menstruation) Reduced libido in both sexes Decreased sexual function Erectile dysfunction in men Hypogonadisms (decreased testicular function with shrinkage of testicles) Infertility Depression and fatigue Loss of muscle mass Anxiety Depression Immune suppression Hyperalgesia Weight gain Anemia Osteoporosis Patients (particularly women of childbearing age) should avoid opioids. There is insufficient evidence to recommend routine monitoring of asymptomatic patients taking opioids in the long-term for hormonal deficiencies.  Immune System: Human studies have demonstrated that opioids have an immunomodulating effect. These effects are mediated via opioid receptors both on immune effector cells and in the central nervous system. Opioids have been demonstrated to have adverse effects on antimicrobial response and anti-tumour surveillance. Buprenorphine has been demonstrated to have no impact on immune function.  Opioid Induced Hyperalgesia: Human studies have demonstrated that prolonged use of opioids can lead to a state of abnormal pain sensitivity, sometimes called opioid induced hyperalgesia (OIH). Opioid induced hyperalgesia is not usually seen in the absence of tolerance to opioid analgesia. Clinically, hyperalgesia may be diagnosed if the patient on long-term opioid therapy presents with increased pain. This might be qualitatively and anatomically distinct from pain related to disease progression or to breakthrough pain resulting from development of opioid tolerance. Pain associated with hyperalgesia tends to be more diffuse than the  pre-existing pain and less defined in quality. Management of opioid induced hyperalgesia requires opioid dose reduction.  Cancer: Chronic opioid therapy has been associated with an increased risk of cancer among noncancer patients with chronic pain. This association was more evident in chronic strong opioid users. Chronic opioid consumption causes significant pathological changes in the small intestine and colon. Epidemiological studies have found that there is a link between opium dependence and initiation of gastrointestinal cancers. Cancer is the second leading cause of death after cardiovascular disease. Chronic use of opioids can cause multiple conditions such as GERD, immunosuppression and renal damage as well as carcinogenic effects, which are associated with the incidence of cancers.   Mortality: Long-term opioid use   has been associated with increased mortality among patients with chronic non-cancer pain (CNCP).  Prescription of long-acting opioids for chronic noncancer pain was associated with a significantly increased risk of all-cause mortality, including deaths from causes other than overdose.  Reference: Von Korff M, Kolodny A, Deyo RA, Chou R. Long-term opioid therapy reconsidered. Ann Intern Med. 2011 Sep 6;155(5):325-8. doi: 10.7326/[id number]. PMID: 21893626; PMCID: PMC3280085. Bedson J, Chen Y, Ashworth J, Hayward RA, Dunn KM, Jordan KP. Risk of adverse events in patients prescribed long-term opioids: A cohort study in the [place] Clinical Practice Research Datalink. Eur J Pain. 2019 May;23(5):908-922. doi: 10.1002/ejp.1357. Epub 2019 Jan 31. PMID: 30620116. Colameco S, Coren JS, Ciervo CA. Continuous opioid treatment for chronic noncancer pain: a time for moderation in prescribing. Postgrad Med. 2009 Jul;121(4):61-6. doi: 10.3810/pgm.2009.07.2032. PMID: 19641271. Chou R, Turner JA, Devine EB, Hansen RN, Sullivan SD, Blazina I, Dana T, Bougatsos C, Deyo RA. The  effectiveness and risks of long-term opioid therapy for chronic pain: a systematic review for a National Institutes of Health Pathways to Prevention Workshop. Ann Intern Med. 2015 Feb 17;162(4):276-86. doi: 10.7326/M14-2559. PMID: 25581257. Warner M, Chen LH, Makuc DM. NCHS Data Brief No. 22. [place]: Centers for Disease Control and Prevention; 2009. Sep, Increase in Fatal Poisonings Involving Opioid Analgesics in the [place], 1999-2006. Song IA, Choi HR, Oh TK. Long-term opioid use and mortality in patients with chronic non-cancer pain: Ten-year follow-up study in [place] from 2010 through 2019. EClinicalMedicine. 2022 Jul 18;51:101558. doi: 10.1016/j.eclinm.2022.101558. PMID: 35875817; PMCID: PMC9304910. Huser, W., Schubert, T., Vogelmann, T. et al. All-cause mortality in patients with long-term opioid therapy compared with non-opioid analgesics for chronic non-cancer pain: a database study. BMC Med 18, 162 (2020). https://doi.org/10.1186/[email or web address] Rashidian H, Zendehdel K, Kamangar F, Malekzadeh R, Haghdoost AA. An Ecological Study of the Association between Opiate Use and Incidence of Cancers. Addict Health. 2016 Fall;8(4):252-260. PMID: 28819556; PMCID: PMC5554805.  Our Goal: Our goal is to control your pain with means other than the use of opioid pain medications.  Our Recommendation: Talk to your physician about coming off of these medications. We can assist you with the tapering down and stopping these medicines. Based on the new information, even if you cannot completely stop the medication, a decrease in the dose may be associated with a lesser risk. Ask for other means of controlling the pain. Decrease or eliminate those factors that significantly contribute to your pain such as smoking, obesity, and a diet heavily tilted towards "inflammatory" nutrients.  Last Updated: 10/09/2022    ____________________________________________________________________________________________     ____________________________________________________________________________________________  Patient Information update  To: All of our patients.  Re: Name change.  It has been made official that our current name, "[facility]"   will soon be changed to " [facility]".   The purpose of this change is to eliminate any confusion created by the concept of our practice being a "Medication Management Pain Clinic". In the past this has led to the misconception that we treat pain primarily by the use of prescription medications.  Nothing can be farther from the truth.   Understanding PAIN MANAGEMENT: To further understand what our practice does, you first have to understand that "Pain Management" is a subspecialty that requires additional training once a physician has completed their specialty training, which can be in either Anesthesia, Neurology, Psychiatry, or Physical Medicine and Rehabilitation (PMR). Each one of these contributes to the final approach taken by each physician to   the management of their patient's pain. To be a "Pain Management Specialist" you must have first completed one of the specialty trainings below.  Anesthesiologists - trained in clinical pharmacology and interventional techniques such as nerve blockade and regional as well as central neuroanatomy. They are trained to block pain before, during, and after surgical interventions.  Neurologists - trained in the diagnosis and pharmacological treatment of complex neurological conditions, such as Multiple Sclerosis, Parkinson's, spinal cord injuries, and other systemic conditions that may be associated with symptoms that may include but are not limited to pain. They tend to rely primarily on the treatment of chronic pain  using prescription medications.  Psychiatrist - trained in conditions affecting the psychosocial wellbeing of patients including but not limited to depression, anxiety, schizophrenia, personality disorders, addiction, and other substance use disorders that may be associated with chronic pain. They tend to rely primarily on the treatment of chronic pain using prescription medications.   Physical Medicine and Rehabilitation (PMR) physicians, also known as physiatrists - trained to treat a wide variety of medical conditions affecting the brain, spinal cord, nerves, bones, joints, ligaments, muscles, and tendons. Their training is primarily aimed at treating patients that have suffered injuries that have caused severe physical impairment. Their training is primarily aimed at the physical therapy and rehabilitation of those patients. They may also work alongside orthopedic surgeons or neurosurgeons using their expertise in assisting surgical patients to recover after their surgeries.  INTERVENTIONAL PAIN MANAGEMENT is sub-subspecialty of Pain Management.  Our physicians are Board-certified in Anesthesia, Pain Management, and Interventional Pain Management.  This meaning that not only have they been trained and Board-certified in their specialty of Anesthesia, and subspecialty of Pain Management, but they have also received further training in the sub-subspecialty of Interventional Pain Management, in order to become Board-certified as INTERVENTIONAL PAIN MANAGEMENT SPECIALIST.    Mission: Our goal is to use our skills in  INTERVENTIONAL PAIN MANAGEMENT as alternatives to the chronic use of prescription opioid medications for the treatment of pain. To make this more clear, we have changed our name to reflect what we do and offer. We will continue to offer medication management assessment and recommendations, but we will not be taking over any patient's medication  management.  ____________________________________________________________________________________________     ____________________________________________________________________________________________  National Pain Medication Shortage  The [place] is experiencing worsening drug shortages. These have had a negative widespread effect on patient care and treatment. Not expected to improve any time soon. Predicted to last past 2029.   Drug shortage list (generic names) Oxycodone IR Oxycodone/APAP Oxymorphone IR Hydromorphone Hydrocodone/APAP Morphine  Where is the problem?  Manufacturing and supply level.  Will this shortage affect you?  Only if you take any of the above pain medications.  How? You may be unable to fill your prescription.  Your pharmacist may offer a "partial fill" of your prescription. (Warning: Do not accept partial fills.) Prescriptions partially filled cannot be transferred to another pharmacy. Read our Medication Rules and Regulation. Depending on how much medicine you are dependent on, you may experience withdrawals when unable to get the medication.  Recommendations: Consider ending your dependence on opioid pain medications. Ask your pain specialist to assist you with the process. Consider switching to a medication currently not in shortage, such as Buprenorphine. Talk to your pain specialist about this option. Consider decreasing your pain medication requirements by managing tolerance thru "Drug Holidays". This may help minimize withdrawals, should you run out of medicine. Control your pain thru   the use of non-pharmacological interventional therapies.   Your prescriber: Prescribers cannot be blamed for shortages. Medication manufacturing and supply issues cannot be fixed by the prescriber.   NOTE: The prescriber is not responsible for supplying the medication, or solving supply issues. Work with your pharmacist to solve it. The patient is responsible for  the decision to take or continue taking the medication and for identifying and securing a legal supply source. By law, supplying the medication is the job and responsibility of the pharmacy. The prescriber is responsible for the evaluation, monitoring, and prescribing of these medications.   Prescribers will NOT: Re-issue prescriptions that have been partially filled. Re-issue prescriptions already sent to a pharmacy.  Re-send prescriptions to a different pharmacy because yours did not have your medication. Ask pharmacist to order more medicine or transfer the prescription to another pharmacy. (Read below.)  New 2023 regulation: "April 12, 2022 Revised Regulation Allows DEA-Registered Pharmacies to Transfer Electronic Prescriptions at a Patient's Request DEA Headquarters Division - Public Information Office Patients now have the ability to request their electronic prescription be transferred to another pharmacy without having to go back to their practitioner to initiate the request. This revised regulation went into effect on Monday, April 08, 2022.     At a patient's request, a DEA-registered retail pharmacy can now transfer an electronic prescription for a controlled substance (schedules II-V) to another DEA-registered retail pharmacy. Prior to this change, patients would have to go through their practitioner to cancel their prescription and have it re-issued to a different pharmacy. The process was taxing and time consuming for both patients and practitioners.    The Drug Enforcement Administration (DEA) published its intent to revise the process for transferring electronic prescriptions on June 30, 2020.  The final rule was published in the federal register on March 07, 2022 and went into effect 30 days later.  Under the final rule, a prescription can only be transferred once between pharmacies, and only if allowed under existing state or other applicable law. The prescription must  remain in its electronic form; may not be altered in any way; and the transfer must be communicated directly between two licensed pharmacists. It's important to note, any authorized refills transfer with the original prescription, which means the entire prescription will be filled at the same pharmacy".  Reference: https://www.dea.gov/stories/2023/2023-04/2022-09-01/revised-regulation-allows-dea-registered-pharmacies-transfer (DEA website announcement)  https://www.govinfo.gov/content/pkg/FR-2022-03-07/pdf/2023-15847.pdf (Federal Register  Department of Justice)   Federal Register / Vol. 88, No. 143 / Thursday, March 07, 2022 / Rules and Regulations DEPARTMENT OF JUSTICE  Drug Enforcement Administration  21 CFR Part 1306  [Docket No. [id number]]  RIN [id number]-AB64 Transfer of Electronic Prescriptions for Schedules II-V Controlled Substances Between Pharmacies for Initial Filling  ____________________________________________________________________________________________     ____________________________________________________________________________________________  Transfer of Pain Medication between Pharmacies  Re: 2023 DEA Clarification on existing regulation  Published on DEA Website: April 12, 2022  Title: Revised Regulation Allows DEA-Registered Pharmacies to Transfer Electronic Prescriptions at a Patient's Request DEA Headquarters Division - Public Information Office  "Patients now have the ability to request their electronic prescription be transferred to another pharmacy without having to go back to their practitioner to initiate the request. This revised regulation went into effect on Monday, April 08, 2022.     At a patient's request, a DEA-registered retail pharmacy can now transfer an electronic prescription for a controlled substance (schedules II-V) to another DEA-registered retail pharmacy. Prior to this change, patients would have to go through their practitioner to  cancel their prescription   and have it re-issued to a different pharmacy. The process was taxing and time consuming for both patients and practitioners.    The Drug Enforcement Administration (DEA) published its intent to revise the process for transferring electronic prescriptions on June 30, 2020.  The final rule was published in the federal register on March 07, 2022 and went into effect 30 days later.  Under the final rule, a prescription can only be transferred once between pharmacies, and only if allowed under existing state or other applicable law. The prescription must remain in its electronic form; may not be altered in any way; and the transfer must be communicated directly between two licensed pharmacists. It's important to note, any authorized refills transfer with the original prescription, which means the entire prescription will be filled at the same pharmacy."    REFERENCES: 1. DEA website announcement https://www.dea.gov/stories/2023/2023-04/2022-09-01/revised-regulation-allows-dea-registered-pharmacies-transfer  2. Department of Justice website  https://www.govinfo.gov/content/pkg/FR-2022-03-07/pdf/2023-15847.pdf  3. DEPARTMENT OF JUSTICE Drug Enforcement Administration 21 CFR Part 1306 [Docket No. [id number]] RIN [id number] "Transfer of Electronic Prescriptions for Schedules II-V Controlled Substances Between Pharmacies for Initial Filling"  ____________________________________________________________________________________________     _______________________________________________________________________  Medication Rules  Purpose: To inform patients, and their family members, of our medication rules and regulations.  Applies to: All patients receiving prescriptions from our practice (written or electronic).  Pharmacy of record: This is the pharmacy where your electronic prescriptions will be sent. Make sure we have the correct one.  Electronic prescriptions: In  compliance with the [place] Strengthen Opioid Misuse Prevention (STOP) Act of 2017 (Session Law 2017-74/H243), effective August 12, 2018, all controlled substances must be electronically prescribed. Written prescriptions, faxing, or calling prescriptions to a pharmacy will no longer be done.  Prescription refills: These will be provided only during in-person appointments. No medications will be renewed without a "face-to-face" evaluation with your provider. Applies to all prescriptions.  NOTE: The following applies primarily to controlled substances (Opioid* Pain Medications).   Type of encounter (visit): For patients receiving controlled substances, face-to-face visits are required. (Not an option and not up to the patient.)  Patient's responsibilities: Pain Pills: Bring all pain pills to every appointment (except for procedure appointments). Pill Bottles: Bring pills in original pharmacy bottle. Bring bottle, even if empty. Always bring the bottle of the most recent fill.  Medication refills: You are responsible for knowing and keeping track of what medications you are taking and when is it that you will need a refill. The day before your appointment: write a list of all prescriptions that need to be refilled. The day of the appointment: give the list to the admitting nurse. Prescriptions will be written only during appointments. No prescriptions will be written on procedure days. If you forget a medication: it will not be "Called in", "Faxed", or "electronically sent". You will need to get another appointment to get these prescribed. No early refills. Do not call asking to have your prescription filled early. Partial  or short prescriptions: Occasionally your pharmacy may not have enough pills to fill your prescription.  NEVER ACCEPT a partial fill or a prescription that is short of the total amount of pills that you were prescribed.  With controlled substances the law allows 72 hours for  the pharmacy to complete the prescription.  If the prescription is not completed within 72 hours, the pharmacist will require a new prescription to be written. This means that you will be short on your medicine and we WILL NOT send another prescription to complete your original   prescription.  Instead, request the pharmacy to send a carrier to a nearby branch to get enough medication to provide you with your full prescription. Prescription Accuracy: You are responsible for carefully inspecting your prescriptions before leaving our office. Have the discharge nurse carefully go over each prescription with you, before taking them home. Make sure that your name is accurately spelled, that your address is correct. Check the name and dose of your medication to make sure it is accurate. Check the number of pills, and the written instructions to make sure they are clear and accurate. Make sure that you are given enough medication to last until your next medication refill appointment. Taking Medication: Take medication as prescribed. When it comes to controlled substances, taking less pills or less frequently than prescribed is permitted and encouraged. Never take more pills than instructed. Never take the medication more frequently than prescribed.  Inform other Doctors: Always inform, all of your healthcare providers, of all the medications you take. Pain Medication from other Providers: You are not allowed to accept any additional pain medication from any other Doctor or Healthcare provider. There are two exceptions to this rule. (see below) In the event that you require additional pain medication, you are responsible for notifying us, as stated below. Cough Medicine: Often these contain an opioid, such as codeine or hydrocodone. Never accept or take cough medicine containing these opioids if you are already taking an opioid* medication. The combination may cause respiratory failure and death. Medication Agreement:  You are responsible for carefully reading and following our Medication Agreement. This must be signed before receiving any prescriptions from our practice. Safely store a copy of your signed Agreement. Violations to the Agreement will result in no further prescriptions. (Additional copies of our Medication Agreement are available upon request.) Laws, Rules, & Regulations: All patients are expected to follow all Federal and State Laws, Statutes, Rules, & Regulations. Ignorance of the Laws does not constitute a valid excuse.  Illegal drugs and Controlled Substances: The use of illegal substances (including, but not limited to marijuana and its derivatives) and/or the illegal use of any controlled substances is strictly prohibited. Violation of this rule may result in the immediate and permanent discontinuation of any and all prescriptions being written by our practice. The use of any illegal substances is prohibited. Adopted CDC guidelines & recommendations: Target dosing levels will be at or below 60 MME/day. Use of benzodiazepines** is not recommended.  Exceptions: There are only two exceptions to the rule of not receiving pain medications from other Healthcare Providers. Exception #1 (Emergencies): In the event of an emergency (i.e.: accident requiring emergency care), you are allowed to receive additional pain medication. However, you are responsible for: As soon as you are able, call our office [phone], at any time of the day or night, and leave a message stating your name, the date and nature of the emergency, and the name and dose of the medication prescribed. In the event that your call is answered by a member of our staff, make sure to document and save the date, time, and the name of the person that took your information.  Exception #2 (Planned Surgery): In the event that you are scheduled by another doctor or dentist to have any type of surgery or procedure, you are allowed (for a period no  longer than 30 days), to receive additional pain medication, for the acute post-op pain. However, in this case, you are responsible for picking up a copy of   our "Post-op Pain Management for Surgeons" handout, and giving it to your surgeon or dentist. This document is available at our office, and does not require an appointment to obtain it. Simply go to our office during business hours (Monday-Thursday from 8:00 AM to 4:00 PM) (Friday 8:00 AM to 12:00 Noon) or if you have a scheduled appointment with us, prior to your surgery, and ask for it by name. In addition, you are responsible for: calling our office [phone], at any time of the day or night, and leaving a message stating your name, name of your surgeon, type of surgery, and date of procedure or surgery. Failure to comply with your responsibilities may result in termination of therapy involving the controlled substances. Medication Agreement Violation. Following the above rules, including your responsibilities will help you in avoiding a Medication Agreement Violation ("Breaking your Pain Medication Contract").  Consequences:  Not following the above rules may result in permanent discontinuation of medication prescription therapy.  *Opioid medications include: morphine, codeine, oxycodone, oxymorphone, hydrocodone, hydromorphone, meperidine, tramadol, tapentadol, buprenorphine, fentanyl, methadone. **Benzodiazepine medications include: diazepam (Valium), alprazolam (Xanax), clonazepam (Klonopine), lorazepam (Ativan), clorazepate (Tranxene), chlordiazepoxide (Librium), estazolam (Prosom), oxazepam (Serax), temazepam (Restoril), triazolam (Halcion) (Last updated: 06/04/2022) ______________________________________________________________________    ______________________________________________________________________  Medication Recommendations and Reminders  Applies to: All patients receiving prescriptions (written and/or  electronic).  Medication Rules & Regulations: You are responsible for reading, knowing, and following our "Medication Rules" document. These exist for your safety and that of others. They are not flexible and neither are we. Dismissing or ignoring them is an act of "non-compliance" that may result in complete and irreversible termination of such medication therapy. For safety reasons, "non-compliance" will not be tolerated. As with the [place] fundamental legal principle of "ignorance of the law is no defense", we will accept no excuses for not having read and knowing the content of documents provided to you by our practice.  Pharmacy of record:  Definition: This is the pharmacy where your electronic prescriptions will be sent.  We do not endorse any particular pharmacy. It is up to you and your insurance to decide what pharmacy to use.  We do not restrict you in your choice of pharmacy. However, once we write for your prescriptions, we will NOT be re-sending more prescriptions to fix restricted supply problems created by your pharmacy, or your insurance.  The pharmacy listed in the electronic medical record should be the one where you want electronic prescriptions to be sent. If you choose to change pharmacy, simply notify our nursing staff. Changes will be made only during your regular appointments and not over the phone.  Recommendations: Keep all of your pain medications in a safe place, under lock and key, even if you live alone. We will NOT replace lost, stolen, or damaged medication. We do not accept "Police Reports" as proof of medications having been stolen. After you fill your prescription, take 1 week's worth of pills and put them away in a safe place. You should keep a separate, properly labeled bottle for this purpose. The remainder should be kept in the original bottle. Use this as your primary supply, until it runs out. Once it's gone, then you know that you have 1 week's worth of medicine,  and it is time to come in for a prescription refill. If you do this correctly, it is unlikely that you will ever run out of medicine. To make sure that the above recommendation works, it is very important that you make   sure your medication refill appointments are scheduled at least 1 week before you run out of medicine. To do this in an effective manner, make sure that you do not leave the office without scheduling your next medication management appointment. Always ask the nursing staff to show you in your prescription , when your medication will be running out. Then arrange for the receptionist to get you a return appointment, at least 7 days before you run out of medicine. Do not wait until you have 1 or 2 pills left, to come in. This is very poor planning and does not take into consideration that we may need to cancel appointments due to bad weather, sickness, or emergencies affecting our staff. DO NOT ACCEPT A "Partial Fill": If for any reason your pharmacy does not have enough pills/tablets to completely fill or refill your prescription, do not allow for a "partial fill". The law allows the pharmacy to complete that prescription within 72 hours, without requiring a new prescription. If they do not fill the rest of your prescription within those 72 hours, you will need a separate prescription to fill the remaining amount, which we will NOT provide. If the reason for the partial fill is your insurance, you will need to talk to the pharmacist about payment alternatives for the remaining tablets, but again, DO NOT ACCEPT A PARTIAL FILL, unless you can trust your pharmacist to obtain the remainder of the pills within 72 hours.  Prescription refills and/or changes in medication(s):  Prescription refills, and/or changes in dose or medication, will be conducted only during scheduled medication management appointments. (Applies to both, written and electronic prescriptions.) No refills on procedure days. No  medication will be changed or started on procedure days. No changes, adjustments, and/or refills will be conducted on a procedure day. Doing so will interfere with the diagnostic portion of the procedure. No phone refills. No medications will be "called into the pharmacy". No Fax refills. No weekend refills. No Holliday refills. No after hours refills.  Remember:  Business hours are:  Monday to Thursday 8:00 AM to 4:00 PM Provider's Schedule: Rufina Kimery, MD - Appointments are:  Medication management: Monday and Wednesday 8:00 AM to 4:00 PM Procedure day: Tuesday and Thursday 7:30 AM to 4:00 PM Bilal Lateef, MD - Appointments are:  Medication management: Tuesday and Thursday 8:00 AM to 4:00 PM Procedure day: Monday and Wednesday 7:30 AM to 4:00 PM (Last update: 06/04/2022) ______________________________________________________________________    ____________________________________________________________________________________________  Drug Holidays  What is a "Drug Holiday"? Drug Holiday: is the name given to the process of slowly tapering down and temporarily stopping the pain medication for the purpose of decreasing or eliminating tolerance to the drug.  Benefits Improved effectiveness Decreased required effective dose Improved pain control End dependence on high dose therapy Decrease cost of therapy Uncovering "opioid-induced hyperalgesia". (OIH)  What is "opioid hyperalgesia"? It is a paradoxical increase in pain caused by exposure to opioids. Stopping the opioid pain medication, contrary to the expected, it actually decreases or completely eliminates the pain. Ref.: "A comprehensive review of opioid-induced hyperalgesia". Marion Lee, et.al. Pain Physician. 2011 Mar-Apr;14(2):145-61.  What is tolerance? Tolerance: the progressive loss of effectiveness of a pain medicine due to repetitive use. A common problem of opioid pain medications.  How long should a "Drug  Holiday" last? Effectiveness depends on the patient staying off all opioid pain medicines for a minimum of 14 consecutive days. (2 weeks)  How about just taking less of the medicine? Does not   work. Will not accomplish goal of eliminating the excess receptors.  How about switching to a different pain medicine? (AKA. "Opioid rotation") Does not work. Creates the illusion of effectiveness by taking advantage of inaccurate equivalent dose calculations between different opioids. -This "technique" was promoted by studies funded by pharmaceutical companies, such as [employer], creators of "OxyContin".  Can I stop the medicine "cold turkey"? We do not recommend it. You should always coordinate with your prescribing physician to make the transition as smoothly as possible. Avoid stopping the medicine abruptly without consulting. We recommend a "slow taper".  What is a slow taper? Taper: refers to the gradual decrease in dose.   How do I stop/taper the dose? Slowly. Decrease the daily amount of pills that you take by one (1) pill every seven (7) days. This is called a "slow downward taper". Example: if you normally take four (4) pills per day, drop it to three (3) pills per day for seven (7) days, then to two (2) pills per day for seven (7) days, then to one (1) per day for seven (7) days, and then stop the medicine. The 14 day "Drug Holiday" starts on the first day without medicine.   Will I experience withdrawals? Unlikely with a slow taper.  What triggers withdrawals? Withdrawals are triggered by the sudden/abrupt stop of high dose opioids. Withdrawals can be avoided by slowly decreasing the dose over a prolonged period of time.  What are withdrawals? Symptoms associated with sudden/abrupt reduction/stopping of high-dose, long-term use of pain medication. Withdrawal are seldom seen on low dose therapy, or patients rarely taking opioid medication.  Early Withdrawal Symptoms may  include: Agitation Anxiety Muscle aches Increased tearing Insomnia Runny nose Sweating Yawning  Late symptoms may include: Abdominal cramping Diarrhea Dilated pupils Goose bumps Nausea Vomiting  When could I see withdrawals? Onset: 8-24 hours after last use for most opioids. 12-48 hours for long-acting opioids (i.e.: methadone)  How long could they last? Duration: 4-10 days for most opioids. 14-21 days for long-acting opioids (i.e.: methadone)  What will happen after I complete my "Drug Holiday"? The need and indications for the opioid analgesic will be reviewed before restarting the medication. Dose requirements will likely decrease and the dose will need to be adjusted accordingly.   (Last update: 10/30/2022) ____________________________________________________________________________________________    ____________________________________________________________________________________________  WARNING: CBD (cannabidiol) & Delta (Delta-8 tetrahydrocannabinol) products.   Applicable to:  All individuals currently taking or considering taking CBD (cannabidiol) and, more important, all patients taking opioid analgesic controlled substances (pain medication). (Example: oxycodone; oxymorphone; hydrocodone; hydromorphone; morphine; methadone; tramadol; tapentadol; fentanyl; buprenorphine; butorphanol; dextromethorphan; meperidine; codeine; etc.)  Introduction:  Recently there has been a drive towards the use of "natural" products for the treatment of different conditions, including pain anxiety and sleep disorders. Marijuana and hemp are two varieties of the cannabis genus plants. Marijuana and its derivatives are illegal, while hemp and its derivatives are not. Cannabidiol (CBD) and tetrahydrocannabinol (THC), are two natural compounds found in plants of the Cannabis genus. They can both be extracted from hemp or marijuana. Both compounds interact with your body's endocannabinoid  system in very different ways. CBD is associated with pain relief (analgesia) while THC is associated with the psychoactive effects ("the high") obtained from the use of marijuana products. There are two main types of THC: Delta-9, which comes from the marijuana plant and it is illegal, and Delta-8, which comes from the hemp plant, and it is legal. (Both, Delta-9-THC and Delta-8-THC are psychoactive and   give you "the high".)   Legality:  Marijuana and its derivatives: illegal Hemp and its derivatives: Legal (State dependent) UPDATE: (09/28/2021) The Drug Enforcement Agency (DEA) issued a letter stating that "delta" cannabinoids, including Delta-8-THCO and Delta-9-THCO, synthetically derived from hemp do not qualify as hemp and will be viewed as Schedule I drugs. (Schedule I drugs, substances, or chemicals are defined as drugs with no currently accepted medical use and a high potential for abuse. Some examples of Schedule I drugs are: heroin, lysergic acid diethylamide (LSD), marijuana (cannabis), 3,4-methylenedioxymethamphetamine (ecstasy), methaqualone, and peyote.) (https://www.dea.gov)  Legal status of CBD in [place]:  "Conditionally Legal"  Reference: "FDA Regulation of Cannabis and Cannabis-Derived Products, Including Cannabidiol (CBD)" - https://www.fda.gov/news-events/public-health-focus/fda-regulation-cannabis-and-cannabis-derived-products-including-cannabidiol-cbd  Warning:  CBD is not FDA approved and has not undergo the same manufacturing controls as prescription drugs.  This means that the purity and safety of available CBD may be questionable. Most of the time, despite manufacturer's claims, it is contaminated with THC (delta-9-tetrahydrocannabinol - the chemical in marijuana responsible for the "HIGH").  When this is the case, the THC contaminant will trigger a positive urine drug screen (UDS) test for Marijuana (carboxy-THC).   The FDA recently put out a warning about 5 things that everyone  should be aware of regarding Delta-8 THC: Delta-8 THC products have not been evaluated or approved by the FDA for safe use and may be marketed in ways that put the public health at risk. The FDA has received adverse event reports involving delta-8 THC-containing products. Delta-8 THC has psychoactive and intoxicating effects. Delta-8 THC manufacturing often involve use of potentially harmful chemicals to create the concentrations of delta-8 THC claimed in the marketplace. The final delta-8 THC product may have potentially harmful by-products (contaminants) due to the chemicals used in the process. Manufacturing of delta-8 THC products may occur in uncontrolled or unsanitary settings, which may lead to the presence of unsafe contaminants or other potentially harmful substances. Delta-8 THC products should be kept out of the reach of children and pets.  NOTE: Because a positive UDS for any illicit substance is a violation of our medication agreement, your opioid analgesics (pain medicine) may be permanently discontinued.  MORE ABOUT CBD  General Information: CBD was discovered in 1940 and it is a derivative of the cannabis sativa genus plants (Marijuana and Hemp). It is one of the 113 identified substances found in Marijuana. It accounts for up to 40% of the plant's extract. As of 2018, preliminary clinical studies on CBD included research for the treatment of anxiety, movement disorders, and pain. CBD is available and consumed in multiple forms, including inhalation of smoke or vapor, as an aerosol spray, and by mouth. It may be supplied as an oil containing CBD, capsules, dried cannabis, or as a liquid solution. CBD is thought not to be as psychoactive as THC (delta-9-tetrahydrocannabinol - the chemical in marijuana responsible for the "HIGH"). Studies suggest that CBD may interact with different biological target receptors in the body, including cannabinoid and other neurotransmitter receptors. As of  2018 the mechanism of action for its biological effects has not been determined.  Side-effects  Adverse reactions: Dry mouth, diarrhea, decreased appetite, fatigue, drowsiness, malaise, weakness, sleep disturbances, and others.  Drug interactions:  CBD may interact with medications such as blood-thinners. CBD causes drowsiness on its own and it will increase drowsiness caused by other medications, including antihistamines (such as Benadryl), benzodiazepines (Xanax, Ativan, Valium), antipsychotics, antidepressants, opioids, alcohol and supplements such as kava, melatonin and St. John's Wort.    Other drug interactions: Brivaracetam (Briviact); Caffeine; Carbamazepine (Tegretol); Citalopram (Celexa); Clobazam (Onfi); Eslicarbazepine (Aptiom); Everolimus (Zostress); Lithium; Methadone (Dolophine); Rufinamide (Banzel); Sedative medications (CNS depressants); Sirolimus (Rapamune); Stiripentol (Diacomit); Tacrolimus (Prograf); Tamoxifen ; Soltamox); Topiramate (Topamax); Valproate; Warfarin (Coumadin); Zonisamide. (Last update: 07/22/2022) ____________________________________________________________________________________________   ____________________________________________________________________________________________  Naloxone Nasal Spray  Why am I receiving this medication? [place] STOP ACT requires that all patients taking high dose opioids or at risk of opioids respiratory depression, be prescribed an opioid reversal agent, such as Naloxone (AKA: Narcan).  What is this medication? NALOXONE (nal OX one) treats opioid overdose, which causes slow or shallow breathing, severe drowsiness, or trouble staying awake. Call emergency services after using this medication. You may need additional treatment. Naloxone works by reversing the effects of opioids. It belongs to a group of medications called opioid blockers.  COMMON BRAND NAME(S): Kloxxado, Narcan  What should I tell my care team before  I take this medication? They need to know if you have any of these conditions: Heart disease Substance use disorder An unusual or allergic reaction to naloxone, other medications, foods, dyes, or preservatives Pregnant or trying to get pregnant Breast-feeding  When to use this medication? This medication is to be used for the treatment of respiratory depression (less than 8 breaths per minute) secondary to opioid overdose.   How to use this medication? This medication is for use in the nose. Lay the person on their back. Support their neck with your hand and allow the head to tilt back before giving the medication. The nasal spray should be given into 1 nostril. After giving the medication, move the person onto their side. Do not remove or test the nasal spray until ready to use. Get emergency medical help right away after giving the first dose of this medication, even if the person wakes up. You should be familiar with how to recognize the signs and symptoms of a narcotic overdose. If more doses are needed, give the additional dose in the other nostril. Talk to your care team about the use of this medication in children. While this medication may be prescribed for children as young as newborns for selected conditions, precautions do apply.  Naloxone Overdosage: If you think you have taken too much of this medicine contact a poison control center or emergency room at once.  NOTE: This medicine is only for you. Do not share this medicine with others.  What if I miss a dose? This does not apply.  What may interact with this medication? This is only used during an emergency. No interactions are expected during emergency use. This list may not describe all possible interactions. Give your health care provider a list of all the medicines, herbs, non-prescription drugs, or dietary supplements you use. Also tell them if you smoke, drink alcohol, or use illegal drugs. Some items may interact with  your medicine.  What should I watch for while using this medication? Keep this medication ready for use in the case of an opioid overdose. Make sure that you have the phone number of your care team and local hospital ready. You may need to have additional doses of this medication. Each nasal spray contains a single dose. Some emergencies may require additional doses. After use, bring the treated person to the nearest hospital or call 911. Make sure the treating care team knows that the person has received a dose of this medication. You will receive additional instructions on what to do during and after use of this   medication before an emergency occurs.  What side effects may I notice from receiving this medication? Side effects that you should report to your care team as soon as possible: Allergic reactions--skin rash, itching, hives, swelling of the face, lips, tongue, or throat Side effects that usually do not require medical attention (report these to your care team if they continue or are bothersome): Constipation Dryness or irritation inside the nose Headache Increase in blood pressure Muscle spasms Stuffy nose Toothache This list may not describe all possible side effects. Call your doctor for medical advice about side effects. You may report side effects to FDA at 1-800-FDA-1088.  Where should I keep my medication? Because this is an emergency medication, you should keep it with you at all times.  Keep out of the reach of children and pets. Store between 20 and 25 degrees C (68 and 77 degrees F). Do not freeze. Throw away any unused medication after the expiration date. Keep in original box until ready to use.  NOTE: This sheet is a summary. It may not cover all possible information. If you have questions about this medicine, talk to your doctor, pharmacist, or health care provider.   2023 Elsevier/Gold Standard (2021-04-06  00:00:00)  ____________________________________________________________________________________________   

## 2022-11-19 NOTE — Progress Notes (Signed)
The patient called and cancel the appointment secondary to feeling sick.

## 2022-11-25 DIAGNOSIS — M170 Bilateral primary osteoarthritis of knee: Secondary | ICD-10-CM | POA: Diagnosis not present

## 2022-12-04 DIAGNOSIS — M818 Other osteoporosis without current pathological fracture: Secondary | ICD-10-CM | POA: Diagnosis not present

## 2022-12-12 DIAGNOSIS — M1712 Unilateral primary osteoarthritis, left knee: Secondary | ICD-10-CM | POA: Diagnosis not present

## 2022-12-19 DIAGNOSIS — M1712 Unilateral primary osteoarthritis, left knee: Secondary | ICD-10-CM | POA: Diagnosis not present

## 2022-12-23 ENCOUNTER — Encounter: Payer: Self-pay | Admitting: Pain Medicine

## 2022-12-24 DIAGNOSIS — M1711 Unilateral primary osteoarthritis, right knee: Secondary | ICD-10-CM | POA: Diagnosis not present

## 2022-12-26 DIAGNOSIS — M1712 Unilateral primary osteoarthritis, left knee: Secondary | ICD-10-CM | POA: Diagnosis not present

## 2022-12-31 DIAGNOSIS — M1711 Unilateral primary osteoarthritis, right knee: Secondary | ICD-10-CM | POA: Diagnosis not present

## 2023-01-07 DIAGNOSIS — M1711 Unilateral primary osteoarthritis, right knee: Secondary | ICD-10-CM | POA: Diagnosis not present

## 2023-01-10 ENCOUNTER — Other Ambulatory Visit (HOSPITAL_BASED_OUTPATIENT_CLINIC_OR_DEPARTMENT_OTHER): Payer: Self-pay | Admitting: Gastroenterology

## 2023-01-10 DIAGNOSIS — R112 Nausea with vomiting, unspecified: Secondary | ICD-10-CM | POA: Diagnosis not present

## 2023-01-10 DIAGNOSIS — K219 Gastro-esophageal reflux disease without esophagitis: Secondary | ICD-10-CM

## 2023-01-10 DIAGNOSIS — R1084 Generalized abdominal pain: Secondary | ICD-10-CM | POA: Diagnosis not present

## 2023-01-10 DIAGNOSIS — K5904 Chronic idiopathic constipation: Secondary | ICD-10-CM | POA: Diagnosis not present

## 2023-01-21 NOTE — Progress Notes (Unsigned)
PROVIDER NOTE: Information contained herein reflects review and annotations entered in association with encounter. Interpretation of such information and data should be left to medically-trained personnel. Information provided to patient can be located elsewhere in the medical record under "Patient Instructions". Document created using STT-dictation technology, any transcriptional errors that may result from process are unintentional.    Patient: Kari Gibbs  Service Category: E/M  Provider: Oswaldo Done, MD  DOB: [birth year]  DOS: 01/22/2023  Referring Provider: Gweneth Dimitri, MD  MRN: [id number]  Specialty: Interventional Pain Management  PCP: Gweneth Dimitri, MD  Type: Established Patient  Setting: Ambulatory outpatient    Location: Office  Delivery: Face-to-face     HPI  [name], a 72 y.o. year old female, is here today because of her No primary diagnosis found.. [name] primary complain today is No chief complaint on file.  Pertinent problems: [name] has Chronic pain syndrome; Chronic flank pain (1ry area of Pain) (Left); Chronic thoracic radicular pain (Left-sided); Chronic occipital neuralgia (Left); Neuropathic pain; Neurogenic pain; History of rib fracture (4th and 8th rib) (Left); Vertebral body hemangioma (T11); Metatarsalgia of foot (Right); Chronic abdominal pain (2ry area of Pain) (Bilateral) (L>R); Primary osteoarthritis; Chronic neck pain (4th area of Pain) (Bilateral) (L>R); Cervical facet syndrome (Bilateral) (L>R); Muscle spasticity; Tremors of nervous system; Muscle twitching; Cervical spondylosis; Chronic low back pain (3ry area of Pain) (Bilateral) w/o sciatica; Chronic low back pain (Bilateral) w/ sciatica (Bilateral); Chronic sacroiliac joint pain (Bilateral); Other specified dorsopathies, sacral and sacrococcygeal region; Chronic sacroiliac joint somatic dysfunction (Bilateral); Epigastric pain; Left upper quadrant pain; Generalized pain;  Headache; Rib pain (Left); Abnormal MRI, thoracic spine open (08/26/2013); Fracture of rib (4th and 8th rib) (Left); 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 Pain Assessment: Severity of   is reported as a  /10. Location:    / . Onset:  . Quality:  . Timing:  . Modifying factor(s):  Marland Kitchen Vitals:  vitals were not taken for this visit.  BMI: Estimated body mass index is 29.12 kg/m as calculated from the following:   Height as of 10/29/22: 5\' 5"  (1.651 m).   Weight as of 10/29/22: 175 lb (79.4 kg). Last encounter: 11/19/2022. Last procedure: 10/29/2022.  Reason for encounter:  *** . ***  Pharmacotherapy Assessment  Analgesic: MS IR 15 mg mg, 1 tab PO q 8 hrs (45 mg/day of morphine) MME/day: 45 mg/day.   Monitoring: [place] PMP: PDMP reviewed during this encounter.       Pharmacotherapy: No side-effects or adverse reactions reported. Compliance: No problems identified. Effectiveness: Clinically acceptable.  No notes on file  No results found for: "CBDTHCR" No results found for: "D8THCCBX" No results found for: "D9THCCBX"  UDS:  Summary  Date Value Ref Range Status  01/23/2022 Note  Final    Comment:    ==================================================================== [employer] Select 13 (MW) ==================================================================== Test                             Result       Flag       Units  Drug Present and Declared for Prescription Verification   Lorazepam                      >1290        EXPECTED   ng/mg creat    Source  of lorazepam is a scheduled prescription medication.    Morphine                       >6452        EXPECTED   ng/mg creat   Normorphine                    491          EXPECTED   ng/mg creat    Potential sources of large amounts of morphine in the absence of    codeine include administration of morphine or use of heroin.      Normorphine is an expected metabolite of morphine.    Hydromorphone                  284          EXPECTED   ng/mg creat    Hydromorphone may be administered as a scheduled prescription    medication and is also an expected metabolite of hydrocodone.    Hydromorphone is also a minor metabolite of morphine which is also    present in this specimen. Concentrations of hydromorphone rarely    exceed 5% of the morphine concentration when metabolism of morphine    is the sole source of hydromorphone.  Drug Absent but Declared for Prescription Verification   Hydrocodone                    Not Detected UNEXPECTED ng/mg creat    Hydrocodone is almost always present in patients taking this drug    consistently. Absence of hydrocodone could be due to lapse of time    since the last dose or unusual pharmacokinetics (rapid metabolism).  ==================================================================== Test                      Result    Flag   Units      Ref Range   Creatinine              155              mg/dL      >=16 ==================================================================== Declared Medications:  The flagging and interpretation on this report are based on the  following declared medications.  Unexpected results may arise from  inaccuracies in the declared medications.   **Note: The testing scope of this panel includes these medications:   Hydrocodone (Norco)  Lorazepam (Ativan)  Morphine (MSIR)   **Note: The testing scope of this panel does not include the  following reported medications:   Acetaminophen (Tylenol)  Acetaminophen (Norco)  Biotin  Bupropion (Wellbutrin XL)  Buspirone (Buspar)  Cholecalciferol  Cranberry  Cyanocobalamin  Diclofenac (Voltaren)  Doxepin (Sinequan)  Estradiol (Estrace)  Eye Drop  Fluoxetine (Prozac)  Folic Acid  Irbesartan (Avapro)  Iron  Loratadine (Claritin)  Magnesium  Montelukast  Multivitamin  Naloxone (Narcan)  Potassium   Probiotic  Supplement  Vitamin C ==================================================================== For clinical consultation, please call [phone]. ====================================================================       ROS  Constitutional: Denies any fever or chills Gastrointestinal: No reported hemesis, hematochezia, vomiting, or acute GI distress Musculoskeletal: Denies any acute onset joint swelling, redness, loss of ROM, or weakness Neurological: No reported episodes of acute onset apraxia, aphasia, dysarthria, agnosia, amnesia, paralysis, loss of coordination, or loss of consciousness  Medication Review  Benefiber, Biotin, Cranberry, Eszopiclone, FLUoxetine, Iron-Vitamin C, LORazepam, Magnesium, Peppermint Oil, Polyethyl Glycol-Propyl Glycol, Prenatal MV &  Min w/FA-DHA, RA Probiotic Gummies, Vitamin D3, acetaminophen, benzonatate, buPROPion, busPIRone, diclofenac Sodium, esomeprazole, estradiol, irbesartan, lansoprazole, loratadine, montelukast, morphine, naloxone, potassium gluconate, sucralfate, and vitamin B-12  History Review  Allergy: Ms. Denherder is allergic to iohexol, ace inhibitors, losartan potassium, neosporin [neomycin-bacitracin zn-polymyx], and bacitracin-polymyxin b. Drug: Ms. Ruder  reports no history of drug use. Alcohol:  reports current alcohol use. Tobacco:  reports that she has never smoked. She has never used smokeless tobacco. Social: Ms. Stampley  reports that she has never smoked. She has never used smokeless tobacco. She reports current alcohol use. She reports that she does not use drugs. 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 Surgical: Ms. 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 Family: family history includes Alzheimer's disease in her father; Hypertension in her mother.  Laboratory Chemistry Profile   Renal Lab Results  Component Value Date   BUN 19 11/15/2021   CREATININE 0.90 11/15/2021   GFRAA >60 01/04/2016   GFRNONAA >60 11/15/2021    Hepatic Lab Results  Component Value Date   AST 29 04/04/2021   ALT 30 04/04/2021   ALBUMIN 4.6 04/04/2021   ALKPHOS 89 04/04/2021   AMYLASE 30 02/27/2010   LIPASE 22 10/12/2012    Electrolytes Lab Results  Component Value Date   NA 140 11/15/2021   K 5.2 (H) 11/15/2021   CL 105 11/15/2021   CALCIUM 9.4 11/15/2021   MG 2.1 01/04/2016    Bone Lab Results  Component Value Date   25OHVITD1 49 01/04/2016   25OHVITD2 25 01/04/2016   25OHVITD3 24 01/04/2016    Inflammation (CRP: Acute Phase) (ESR: Chronic Phase) Lab Results  Component Value Date   CRP <0.5 01/04/2016   ESRSEDRATE 17 01/04/2016         Note: Above Lab results reviewed.  Recent Imaging Review  DG PAIN CLINIC C-ARM 1-60 MIN NO REPORT Fluoro was used, but no Radiologist interpretation will be provided.  Please refer to "NOTES" tab for provider  progress note. Note: Reviewed        Physical Exam  General appearance: Well nourished, well developed, and well hydrated. In no apparent acute distress Mental status: Alert, oriented x 3 (person, place, & time)       Respiratory: No evidence of acute respiratory distress Eyes: PERLA Vitals: There were no vitals taken for this visit. BMI: Estimated body mass index is 29.12 kg/m as calculated from the following:   Height as of 10/29/22: 5\' 5"  (1.651 m).   Weight as of 10/29/22: 175 lb (79.4 kg). Ideal: Patient weight not recorded  Assessment   Diagnosis Status  No diagnosis found. Controlled Controlled Controlled   Updated Problems: No problems updated.  Plan of Care  Problem-specific:  No problem-specific Assessment &  Plan notes found for this encounter.  Ms. Teffany Obando has a current medication list which includes the following long-term medication(s): bupropion, diclofenac sodium, eszopiclone, fluoxetine, lansoprazole, morphine, morphine, and morphine.  Pharmacotherapy (Medications Ordered): No orders of the defined types were placed in this encounter.  Orders:  No orders of the defined types were placed in this encounter.  Follow-up plan:   No follow-ups on file.      Interventional Therapies  Risk Factors  Considerations:   ALLERGY: Contrast Dye (Anaphylactic - Type)    Planned  Pending:      Under consideration:   Diagnostic left costochondral joint injection  Diagnostic left C2 + TON NB    Completed:   Therapeutic left 8th rib intercostal NB x1 (09/10/2022)  Palliative left intercostal NB (T5 to T8) x1 (03/03/2017)  Palliative left GONB Blk x2 (07/17/2016)  Palliative bilateral SI Blk x1 (04/02/2018)    Therapeutic  Palliative (PRN) options:   Palliative left intercostal NB (T5 to T8) #2  Palliative left GONB Blk #3  Palliative bilateral SI Blk #2    Pharmacotherapy  Nonopioids transferred 05/29/2020: Gabapentin and Voltaren gel         Recent Visits Date Type Provider Dept  10/29/22 Procedure visit Delano Metz, MD [facility]  10/23/22 Office Visit Delano Metz, MD [facility]  Showing recent visits within past 90 days and meeting all other requirements Future Appointments Date Type Provider Dept  01/22/23 Appointment Delano Metz, MD [facility]  Showing future appointments within next 90 days and meeting all other requirements  I discussed the assessment and treatment plan with the patient. The patient was provided an opportunity to ask questions and all were answered. The patient agreed with the plan and demonstrated an understanding of the instructions.  Patient advised to call back or seek an in-person evaluation if the symptoms or condition worsens.  Duration of encounter: *** minutes.  Total time on encounter, as per AMA guidelines included both the face-to-face and non-face-to-face time personally spent by the physician and/or other qualified health care professional(s) on the day of the encounter (includes time in activities that require the physician or other qualified health care professional and does not include time in activities normally performed by clinical staff). Physician's time may include the following activities when performed: Preparing to see the patient (e.g., pre-charting review of records, searching for previously ordered imaging, lab work, and nerve conduction tests) Review of prior analgesic pharmacotherapies. Reviewing PMP Interpreting ordered tests (e.g., lab work, imaging, nerve conduction tests) Performing post-procedure evaluations, including interpretation of diagnostic procedures Obtaining and/or reviewing separately obtained history Performing a medically appropriate examination and/or evaluation Counseling and educating the patient/family/caregiver Ordering medications, tests, or procedures Referring and communicating with other  health care professionals (when not separately reported) Documenting clinical information in the electronic or other health record Independently interpreting results (not separately reported) and communicating results to the patient/ family/caregiver Care coordination (not separately reported)  Note by: Oswaldo Done, MD Date: 01/22/2023; Time: 12:19 PM

## 2023-01-22 ENCOUNTER — Encounter: Payer: Self-pay | Admitting: Pain Medicine

## 2023-01-22 ENCOUNTER — Ambulatory Visit: Payer: Medicare Other | Attending: Pain Medicine | Admitting: Pain Medicine

## 2023-01-22 VITALS — BP 153/64 | HR 84 | Temp 97.7°F | Ht 66.0 in | Wt 175.0 lb

## 2023-01-22 DIAGNOSIS — G8929 Other chronic pain: Secondary | ICD-10-CM | POA: Diagnosis not present

## 2023-01-22 DIAGNOSIS — R1011 Right upper quadrant pain: Secondary | ICD-10-CM | POA: Insufficient documentation

## 2023-01-22 DIAGNOSIS — Z5181 Encounter for therapeutic drug level monitoring: Secondary | ICD-10-CM

## 2023-01-22 DIAGNOSIS — Z79899 Other long term (current) drug therapy: Secondary | ICD-10-CM | POA: Diagnosis not present

## 2023-01-22 DIAGNOSIS — R109 Unspecified abdominal pain: Secondary | ICD-10-CM | POA: Diagnosis not present

## 2023-01-22 DIAGNOSIS — G894 Chronic pain syndrome: Secondary | ICD-10-CM

## 2023-01-22 DIAGNOSIS — M542 Cervicalgia: Secondary | ICD-10-CM | POA: Diagnosis not present

## 2023-01-22 DIAGNOSIS — Z79891 Long term (current) use of opiate analgesic: Secondary | ICD-10-CM

## 2023-01-22 DIAGNOSIS — R1012 Left upper quadrant pain: Secondary | ICD-10-CM | POA: Insufficient documentation

## 2023-01-22 DIAGNOSIS — M545 Low back pain, unspecified: Secondary | ICD-10-CM | POA: Diagnosis not present

## 2023-01-22 DIAGNOSIS — Z9189 Other specified personal risk factors, not elsewhere classified: Secondary | ICD-10-CM

## 2023-01-22 MED ORDER — MORPHINE SULFATE 15 MG PO TABS: 15.0000 mg | ORAL_TABLET | Freq: Three times a day (TID) | ORAL | 0 refills | Status: DC | PRN

## 2023-01-22 MED ORDER — NALOXONE HCL 4 MG/0.1ML NA LIQD
1.0000 | NASAL | 0 refills | Status: AC | PRN
Start: 2023-01-22 — End: 2024-01-22

## 2023-01-22 NOTE — Patient Instructions (Signed)
____________________________________________________________________________________________  Opioid Pain Medication Update  To: All patients taking opioid pain medications. (I.e.: hydrocodone, hydromorphone, oxycodone, oxymorphone, morphine, codeine, methadone, tapentadol, tramadol, buprenorphine, fentanyl, etc.)  Re: Updated review of side effects and adverse reactions of opioid analgesics, as well as new information about long term effects of this class of medications.  Direct risks of long-term opioid therapy are not limited to opioid addiction and overdose. Potential medical risks include serious fractures, breathing problems during sleep, hyperalgesia, immunosuppression, chronic constipation, bowel obstruction, myocardial infarction, and tooth decay secondary to xerostomia.  Unpredictable adverse effects that can occur even if you take your medication correctly: Cognitive impairment, respiratory depression, and death. Most people think that if they take their medication "correctly", and "as instructed", that they will be safe. Nothing could be farther from the truth. In reality, a significant amount of recorded deaths associated with the use of opioids has occurred in individuals that had taken the medication for a long time, and were taking their medication correctly. The following are examples of how this can happen: Patient taking his/her medication for a long time, as instructed, without any side effects, is given a certain antibiotic or another unrelated medication, which in turn triggers a "Drug-to-drug interaction" leading to disorientation, cognitive impairment, impaired reflexes, respiratory depression or an untoward event leading to serious bodily harm or injury, including death.  Patient taking his/her medication for a long time, as instructed, without any side effects, develops an acute impairment of liver and/or kidney function. This will lead to a rapid inability of the body to  breakdown and eliminate their pain medication, which will result in effects similar to an "overdose", but with the same medicine and dose that they had always taken. This again may lead to disorientation, cognitive impairment, impaired reflexes, respiratory depression or an untoward event leading to serious bodily harm or injury, including death.  A similar problem will occur with patients as they grow older and their liver and kidney function begins to decrease as part of the aging process.  Background information: Historically, the original case for using long-term opioid therapy to treat chronic noncancer pain was based on safety assumptions that subsequent experience has called into question. In 1996, the American Pain Society and the American Academy of Pain Medicine issued a consensus statement supporting long-term opioid therapy. This statement acknowledged the dangers of opioid prescribing but concluded that the risk for addiction was low; respiratory depression induced by opioids was short-lived, occurred mainly in opioid-naive patients, and was antagonized by pain; tolerance was not a common problem; and efforts to control diversion should not constrain opioid prescribing. This has now proven to be wrong. Experience regarding the risks for opioid addiction, misuse, and overdose in community practice has failed to support these assumptions.  According to the Centers for Disease Control and Prevention, fatal overdoses involving opioid analgesics have increased sharply over the past decade. Currently, more than 96,700 people die from drug overdoses every year. Opioids are a factor in 7 out of every 10 overdose deaths. Deaths from drug overdose have surpassed motor vehicle accidents as the leading cause of death for individuals between the ages of 35 and 54.  Clinical data suggest that neuroendocrine dysfunction may be very common in both men and women, potentially causing hypogonadism, erectile  dysfunction, infertility, decreased libido, osteoporosis, and depression. Recent studies linked higher opioid dose to increased opioid-related mortality. Controlled observational studies reported that long-term opioid therapy may be associated with increased risk for cardiovascular events. Subsequent meta-analysis concluded   that the safety of long-term opioid therapy in elderly patients has not been proven.   Side Effects and adverse reactions: Common side effects: Drowsiness (sedation). Dizziness. Nausea and vomiting. Constipation. Physical dependence -- Dependence often manifests with withdrawal symptoms when opioids are discontinued or decreased. Tolerance -- As you take repeated doses of opioids, you require increased medication to experience the same effect of pain relief. Respiratory depression -- This can occur in healthy people, especially with higher doses. However, people with COPD, asthma or other lung conditions may be even more susceptible to fatal respiratory impairment.  Uncommon side effects: An increased sensitivity to feeling pain and extreme response to pain (hyperalgesia). Chronic use of opioids can lead to this. Delayed gastric emptying (the process by which the contents of your stomach are moved into your small intestine). Muscle rigidity. Immune system and hormonal dysfunction. Quick, involuntary muscle jerks (myoclonus). Arrhythmia. Itchy skin (pruritus). Dry mouth (xerostomia).  Long-term side effects: Chronic constipation. Sleep-disordered breathing (SDB). Increased risk of bone fractures. Hypothalamic-pituitary-adrenal dysregulation. Increased risk of overdose.  RISKS: Fractures and Falls:  Opioids increase the risk and incidence of falls. This is of particular importance in elderly patients.  Endocrine System:  Long-term administration is associated with endocrine abnormalities (endocrinopathies). (Also known as Opioid-induced Endocrinopathy) Influences  on both the hypothalamic-pituitary-adrenal axis?and the hypothalamic-pituitary-gonadal axis have been demonstrated with consequent hypogonadism and adrenal insufficiency in both sexes. Hypogonadism and decreased levels of dehydroepiandrosterone sulfate have been reported in men and women. Endocrine effects include: Amenorrhoea in women (abnormal absence of menstruation) Reduced libido in both sexes Decreased sexual function Erectile dysfunction in men Hypogonadisms (decreased testicular function with shrinkage of testicles) Infertility Depression and fatigue Loss of muscle mass Anxiety Depression Immune suppression Hyperalgesia Weight gain Anemia Osteoporosis Patients (particularly women of childbearing age) should avoid opioids. There is insufficient evidence to recommend routine monitoring of asymptomatic patients taking opioids in the long-term for hormonal deficiencies.  Immune System: Human studies have demonstrated that opioids have an immunomodulating effect. These effects are mediated via opioid receptors both on immune effector cells and in the central nervous system. Opioids have been demonstrated to have adverse effects on antimicrobial response and anti-tumour surveillance. Buprenorphine has been demonstrated to have no impact on immune function.  Opioid Induced Hyperalgesia: Human studies have demonstrated that prolonged use of opioids can lead to a state of abnormal pain sensitivity, sometimes called opioid induced hyperalgesia (OIH). Opioid induced hyperalgesia is not usually seen in the absence of tolerance to opioid analgesia. Clinically, hyperalgesia may be diagnosed if the patient on long-term opioid therapy presents with increased pain. This might be qualitatively and anatomically distinct from pain related to disease progression or to breakthrough pain resulting from development of opioid tolerance. Pain associated with hyperalgesia tends to be more diffuse than the  pre-existing pain and less defined in quality. Management of opioid induced hyperalgesia requires opioid dose reduction.  Cancer: Chronic opioid therapy has been associated with an increased risk of cancer among noncancer patients with chronic pain. This association was more evident in chronic strong opioid users. Chronic opioid consumption causes significant pathological changes in the small intestine and colon. Epidemiological studies have found that there is a link between opium dependence and initiation of gastrointestinal cancers. Cancer is the second leading cause of death after cardiovascular disease. Chronic use of opioids can cause multiple conditions such as GERD, immunosuppression and renal damage as well as carcinogenic effects, which are associated with the incidence of cancers.   Mortality: Long-term opioid use   has been associated with increased mortality among patients with chronic non-cancer pain (CNCP).  Prescription of long-acting opioids for chronic noncancer pain was associated with a significantly increased risk of all-cause mortality, including deaths from causes other than overdose.  Reference: Von Korff M, Kolodny A, Deyo RA, Chou R. Long-term opioid therapy reconsidered. Ann Intern Med. 2011 Sep 6;155(5):325-8. doi: 10.7326/[id number]. PMID: 16109604; PMCID: [id number]. Randon Goldsmith, Hayward RA, Dunn KM, [place] KP. Risk of adverse events in patients prescribed long-term opioids: A cohort study in the [place] [facility] Research Datalink. Eur J Pain. 2019 May;23(5):908-922. doi: 10.1002/ejp.1357. Epub 2019 Jan 31. PMID: 78295621. Colameco S, Coren JS, Ciervo CA. Continuous opioid treatment for chronic noncancer pain: a time for moderation in prescribing. Postgrad Med. 2009 Jul;121(4):61-6. doi: 10.3810/pgm.2009.07.2032. PMID: 30865784. William Hamburger RN, Durand SD, Blazina I, Cristopher Peru, Bougatsos C, Deyo RA. The  effectiveness and risks of long-term opioid therapy for chronic pain: a systematic review for a [employer] of [employer] Pathways to [employer]. Ann Intern Med. 2015 Feb 17;162(4):276-86. doi: 10.7326/M14-2559. PMID: 69629528. Caryl Bis [facility], [place] DM. NCHS Data Brief No. 22. [place]: Centers for Disease Control and Prevention; 2009. Sep, Increase in Fatal Poisonings Involving Opioid Analgesics in the [place], 1999-2006. Song IA, Choi HR, Oh TK. Long-term opioid use and mortality in patients with chronic non-cancer pain: Ten-year follow-up study in [place] from 2010 through 2019. EClinicalMedicine. 2022 Jul 18;51:101558. doi: 10.1016/j.eclinm.2022.413244. PMID: 01027253; PMCID: [id number]. Huser, W., Schubert, T., Vogelmann, T. et al. All-cause mortality in patients with long-term opioid therapy compared with non-opioid analgesics for chronic non-cancer pain: a database study. BMC Med 18, 162 (2020). http://lester.info/ Rashidian H, Karie Kirks, Malekzadeh R, Haghdoost AA. An Ecological Study of the Association between Opiate Use and Incidence of Cancers. Addict Health. 2016 Fall;8(4):252-260. PMID: 59563875; PMCID: [id number].  Our Goal: Our goal is to control your pain with means other than the use of opioid pain medications.  Our Recommendation: Talk to your physician about coming off of these medications. We can assist you with the tapering down and stopping these medicines. Based on the new information, even if you cannot completely stop the medication, a decrease in the dose may be associated with a lesser risk. Ask for other means of controlling the pain. Decrease or eliminate those factors that significantly contribute to your pain such as smoking, obesity, and a diet heavily tilted towards "inflammatory" nutrients.  Last Updated: 10/09/2022    ____________________________________________________________________________________________     ____________________________________________________________________________________________  Transfer of Pain Medication between Pharmacies  Re: 2023 DEA Clarification on existing regulation  Published on DEA Website: April 12, 2022  Title: Revised Regulation Allows DEA-Registered Pharmacies to Electrical engineer Prescriptions at a Patient's Request DEA Headquarters Division - [employer]  "Patients now have the ability to request their electronic prescription be transferred to another pharmacy without having to go back to their practitioner to initiate the request. This revised regulation went into effect on Monday, April 08, 2022.     At a patient's request, a DEA-registered retail pharmacy can now transfer an electronic prescription for a controlled substance (schedules II-V) to another DEA-registered retail pharmacy. Prior to this change, patients would have to go through their practitioner to cancel their prescription and have it re-issued to a different pharmacy. The process was taxing and time consuming for both patients and practitioners.    The Drug Enforcement Administration [facility]) published its intent to revise the  process for transferring electronic prescriptions on June 30, 2020.  The final rule was published in the federal register on March 07, 2022 and went into effect 30 days later.  Under the final rule, a prescription can only be transferred once between pharmacies, and only if allowed under existing state or other applicable law. The prescription must remain in its electronic form; may not be altered in any way; and the transfer must be communicated directly between two licensed pharmacists. It's important to note, any authorized refills transfer with the original prescription, which means the entire prescription will be filled at the same pharmacy."     REFERENCES: 1. DEA website announcement HugeHand.is  2. Department of Justice website  CheapWipes.at.pdf  3. DEPARTMENT OF JUSTICE Drug Enforcement Administration 21 CFR Part 1306 [Docket No. [id number]-637] RIN [id number]-[id number] "Transfer of Electronic Prescriptions for Schedules II-V Controlled Substances Between Pharmacies for Initial Filling"  ____________________________________________________________________________________________     _______________________________________________________________________  Medication Rules  Purpose: To inform patients, and their family members, of our medication rules and regulations.  Applies to: All patients receiving prescriptions from our practice (written or electronic).  Pharmacy of record: This is the pharmacy where your electronic prescriptions will be sent. Make sure we have the correct one.  Electronic prescriptions: In compliance with the [facility] Strengthen Opioid Misuse Prevention (STOP) Act of 2017 (Session Conni Elliot [phone]), effective August 12, 2018, all controlled substances must be electronically prescribed. Written prescriptions, faxing, or calling prescriptions to a pharmacy will no longer be done.  Prescription refills: These will be provided only during in-person appointments. No medications will be renewed without a "face-to-face" evaluation with your provider. Applies to all prescriptions.  NOTE: The following applies primarily to controlled substances (Opioid* Pain Medications).   Type of encounter (visit): For patients receiving controlled substances, face-to-face visits are required. (Not an option and not up to the patient.)  Patient's responsibilities: Pain Pills: Bring all pain pills to every appointment (except for procedure appointments). Pill Bottles: Bring  pills in original pharmacy bottle. Bring bottle, even if empty. Always bring the bottle of the most recent fill.  Medication refills: You are responsible for knowing and keeping track of what medications you are taking and when is it that you will need a refill. The day before your appointment: write a list of all prescriptions that need to be refilled. The day of the appointment: give the list to the admitting nurse. Prescriptions will be written only during appointments. No prescriptions will be written on procedure days. If you forget a medication: it will not be "Called in", "Faxed", or "electronically sent". You will need to get another appointment to get these prescribed. No early refills. Do not call asking to have your prescription filled early. Partial  or short prescriptions: Occasionally your pharmacy may not have enough pills to fill your prescription.  NEVER ACCEPT a partial fill or a prescription that is short of the total amount of pills that you were prescribed.  With controlled substances the law allows 72 hours for the pharmacy to complete the prescription.  If the prescription is not completed within 72 hours, the pharmacist will require a new prescription to be written. This means that you will be short on your medicine and we WILL NOT send another prescription to complete your original prescription.  Instead, request the pharmacy to send a carrier to a nearby branch to get enough medication to provide you with your full prescription. Prescription Accuracy: You are responsible for carefully inspecting your  prescriptions before leaving our office. Have the discharge nurse carefully go over each prescription with you, before taking them home. Make sure that your name is accurately spelled, that your address is correct. Check the name and dose of your medication to make sure it is accurate. Check the number of pills, and the written instructions to make sure they are clear and accurate. Make  sure that you are given enough medication to last until your next medication refill appointment. Taking Medication: Take medication as prescribed. When it comes to controlled substances, taking less pills or less frequently than prescribed is permitted and encouraged. Never take more pills than instructed. Never take the medication more frequently than prescribed.  Inform other Doctors: Always inform, all of your healthcare providers, of all the medications you take. Pain Medication from other Providers: You are not allowed to accept any additional pain medication from any other Doctor or Healthcare provider. There are two exceptions to this rule. (see below) In the event that you require additional pain medication, you are responsible for notifying us, as stated below. Cough Medicine: Often these contain an opioid, such as codeine or hydrocodone. Never accept or take cough medicine containing these opioids if you are already taking an opioid* medication. The combination may cause respiratory failure and death. Medication Agreement: You are responsible for carefully reading and following our Medication Agreement. This must be signed before receiving any prescriptions from our practice. Safely store a copy of your signed Agreement. Violations to the Agreement will result in no further prescriptions. (Additional copies of our Medication Agreement are available upon request.) Laws, Rules, & Regulations: All patients are expected to follow all [address] and [employer], Rules, [place] [employer]. Ignorance of the Laws does not constitute a valid excuse.  Illegal drugs and Controlled Substances: The use of illegal substances (including, but not limited to marijuana and its derivatives) and/or the illegal use of any controlled substances is strictly prohibited. Violation of this rule may result in the immediate and permanent discontinuation of any and all prescriptions being written by our practice. The use of  any illegal substances is prohibited. Adopted CDC guidelines & recommendations: Target dosing levels will be at or below 60 MME/day. Use of benzodiazepines** is not recommended.  Exceptions: There are only two exceptions to the rule of not receiving pain medications from other Healthcare Providers. Exception #1 (Emergencies): In the event of an emergency (i.e.: accident requiring emergency care), you are allowed to receive additional pain medication. However, you are responsible for: As soon as you are able, call our office [phone], at any time of the day or night, and leave a message stating your name, the date and nature of the emergency, and the name and dose of the medication prescribed. In the event that your call is answered by a member of our staff, make sure to document and save the date, time, and the name of the person that took your information.  Exception #2 (Planned Surgery): In the event that you are scheduled by another doctor or dentist to have any type of surgery or procedure, you are allowed (for a period no longer than 30 days), to receive additional pain medication, for the acute post-op pain. However, in this case, you are responsible for picking up a copy of our "Post-op Pain Management for Surgeons" handout, and giving it to your surgeon or dentist. This document is available at our office, and does not require an appointment to obtain it. Simply go to  our office during business hours (Monday-Thursday from 8:00 AM to 4:00 PM) (Friday 8:00 AM to 12:00 Noon) or if you have a scheduled appointment with [place], prior to your surgery, and ask for it by name. In addition, you are responsible for: calling our office [phone], at any time of the day or night, and leaving a message stating your name, name of your surgeon, type of surgery, and date of procedure or surgery. Failure to comply with your responsibilities may result in termination of therapy involving the controlled  substances. Medication Agreement Violation. Following the above rules, including your responsibilities will help you in avoiding a Medication Agreement Violation ("Breaking your Pain Medication Contract").  Consequences:  Not following the above rules may result in permanent discontinuation of medication prescription therapy.  *Opioid medications include: morphine, codeine, oxycodone, oxymorphone, hydrocodone, hydromorphone, meperidine, tramadol, tapentadol, buprenorphine, fentanyl, methadone. **Benzodiazepine medications include: diazepam (Valium), alprazolam (Xanax), clonazepam (Klonopine), lorazepam (Ativan), clorazepate (Tranxene), chlordiazepoxide (Librium), estazolam (Prosom), oxazepam (Serax), temazepam (Restoril), triazolam (Halcion) (Last updated: 06/04/2022) ______________________________________________________________________    ______________________________________________________________________  Medication Recommendations and Reminders  Applies to: All patients receiving prescriptions (written and/or electronic).  Medication Rules & Regulations: You are responsible for reading, knowing, and following our "Medication Rules" document. These exist for your safety and that of others. They are not flexible and neither are we. Dismissing or ignoring them is an act of "non-compliance" that may result in complete and irreversible termination of such medication therapy. For safety reasons, "non-compliance" will not be tolerated. As with the [place] fundamental legal principle of "ignorance of the law is no defense", we will accept no excuses for not having read and knowing the content of documents provided to you by our practice.  Pharmacy of record:  Definition: This is the pharmacy where your electronic prescriptions will be sent.  We do not endorse any particular pharmacy. It is up to you and your insurance to decide what pharmacy to use.  We do not restrict you in your choice of  pharmacy. However, once we write for your prescriptions, we will NOT be re-sending more prescriptions to fix restricted supply problems created by your pharmacy, or your insurance.  The pharmacy listed in the electronic medical record should be the one where you want electronic prescriptions to be sent. If you choose to change pharmacy, simply notify our nursing staff. Changes will be made only during your regular appointments and not over the phone.  Recommendations: Keep all of your pain medications in a safe place, under lock and key, even if you live alone. We will NOT replace lost, stolen, or damaged medication. We do not accept "Police Reports" as proof of medications having been stolen. After you fill your prescription, take 1 week's worth of pills and put them away in a safe place. You should keep a separate, properly labeled bottle for this purpose. The remainder should be kept in the original bottle. Use this as your primary supply, until it runs out. Once it's gone, then you know that you have 1 week's worth of medicine, and it is time to come in for a prescription refill. If you do this correctly, it is unlikely that you will ever run out of medicine. To make sure that the above recommendation works, it is very important that you make sure your medication refill appointments are scheduled at least 1 week before you run out of medicine. To do this in an effective manner, make sure that you do not leave the office without  scheduling your next medication management appointment. Always ask the nursing staff to show you in your prescription , when your medication will be running out. Then arrange for the receptionist to get you a return appointment, at least 7 days before you run out of medicine. Do not wait until you have 1 or 2 pills left, to come in. This is very poor planning and does not take into consideration that we may need to cancel appointments due to bad weather, sickness, or emergencies  affecting our staff. DO NOT ACCEPT A "Partial Fill": If for any reason your pharmacy does not have enough pills/tablets to completely fill or refill your prescription, do not allow for a "partial fill". The law allows the pharmacy to complete that prescription within 72 hours, without requiring a new prescription. If they do not fill the rest of your prescription within those 72 hours, you will need a separate prescription to fill the remaining amount, which we will NOT provide. If the reason for the partial fill is your insurance, you will need to talk to the pharmacist about payment alternatives for the remaining tablets, but again, DO NOT ACCEPT A PARTIAL FILL, unless you can trust your pharmacist to obtain the remainder of the pills within 72 hours.  Prescription refills and/or changes in medication(s):  Prescription refills, and/or changes in dose or medication, will be conducted only during scheduled medication management appointments. (Applies to both, written and electronic prescriptions.) No refills on procedure days. No medication will be changed or started on procedure days. No changes, adjustments, and/or refills will be conducted on a procedure day. Doing so will interfere with the diagnostic portion of the procedure. No phone refills. No medications will be "called into the pharmacy". No Fax refills. No weekend refills. No Holliday refills. No after hours refills.  Remember:  Business hours are:  Monday to Thursday 8:00 AM to 4:00 PM Provider's Schedule: Delano Metz, MD - Appointments are:  Medication management: Monday and Wednesday 8:00 AM to 4:00 PM Procedure day: Tuesday and Thursday 7:30 AM to 4:00 PM Edward Jolly, MD - Appointments are:  Medication management: Tuesday and Thursday 8:00 AM to 4:00 PM Procedure day: Monday and Wednesday 7:30 AM to 4:00 PM (Last update: 06/04/2022) ______________________________________________________________________    ____________________________________________________________________________________________  Naloxone Nasal Spray  Why am I receiving this medication? [place] STOP ACT requires that all patients taking high dose opioids or at risk of opioids respiratory depression, be prescribed an opioid reversal agent, such as Naloxone (AKA: Narcan).  What is this medication? NALOXONE (nal OX one) treats opioid overdose, which causes slow or shallow breathing, severe drowsiness, or trouble staying awake. Call emergency services after using this medication. You may need additional treatment. Naloxone works by reversing the effects of opioids. It belongs to a group of medications called opioid blockers.  COMMON BRAND NAME(S): Kloxxado, Narcan  What should I tell my care team before I take this medication? They need to know if you have any of these conditions: Heart disease Substance use disorder An unusual or allergic reaction to naloxone, other medications, foods, dyes, or preservatives Pregnant or trying to get pregnant Breast-feeding  When to use this medication? This medication is to be used for the treatment of respiratory depression (less than 8 breaths per minute) secondary to opioid overdose.   How to use this medication? This medication is for use in the nose. Lay the person on their back. Support their neck with your hand and allow the head to tilt  back before giving the medication. The nasal spray should be given into 1 nostril. After giving the medication, move the person onto their side. Do not remove or test the nasal spray until ready to use. Get emergency medical help right away after giving the first dose of this medication, even if the person wakes up. You should be familiar with how to recognize the signs and symptoms of a narcotic overdose. If more doses are needed, give the additional dose in the other nostril. Talk to your care team about the use of this medication in children.  While this medication may be prescribed for children as young as newborns for selected conditions, precautions do apply.  Naloxone Overdosage: If you think you have taken too much of this medicine contact a poison control center or emergency room at once.  NOTE: This medicine is only for you. Do not share this medicine with others.  What if I miss a dose? This does not apply.  What may interact with this medication? This is only used during an emergency. No interactions are expected during emergency use. This list may not describe all possible interactions. Give your health care provider a list of all the medicines, herbs, non-prescription drugs, or dietary supplements you use. Also tell them if you smoke, drink alcohol, or use illegal drugs. Some items may interact with your medicine.  What should I watch for while using this medication? Keep this medication ready for use in the case of an opioid overdose. Make sure that you have the phone number of your care team and local hospital ready. You may need to have additional doses of this medication. Each nasal spray contains a single dose. Some emergencies may require additional doses. After use, bring the treated person to the nearest hospital or call 911. Make sure the treating care team knows that the person has received a dose of this medication. You will receive additional instructions on what to do during and after use of this medication before an emergency occurs.  What side effects may I notice from receiving this medication? Side effects that you should report to your care team as soon as possible: Allergic reactions--skin rash, itching, hives, swelling of the face, lips, tongue, or throat Side effects that usually do not require medical attention (report these to your care team if they continue or are bothersome): Constipation Dryness or irritation inside the nose Headache Increase in blood pressure Muscle spasms Stuffy  nose Toothache This list may not describe all possible side effects. Call your doctor for medical advice about side effects. You may report side effects to FDA at 1-800-FDA-1088.  Where should I keep my medication? Because this is an emergency medication, you should keep it with you at all times.  Keep out of the reach of children and pets. Store between 20 and 25 degrees C (68 and 77 degrees F). Do not freeze. Throw away any unused medication after the expiration date. Keep in original box until ready to use.  NOTE: This sheet is a summary. It may not cover all possible information. If you have questions about this medicine, talk to your doctor, pharmacist, or health care provider.   2023 [employer]/Gold Standard (2021-04-06 00:00:00)  ____________________________________________________________________________________________

## 2023-01-22 NOTE — Progress Notes (Signed)
Nursing Pain Medication Assessment:  Safety precautions to be maintained throughout the outpatient stay will include: orient to surroundings, keep bed in low position, maintain call bell within reach at all times, provide assistance with transfer out of bed and ambulation.  Medication Inspection Compliance: Pill count conducted under aseptic conditions, in front of the patient. Neither the pills nor the bottle was removed from the patient's sight at any time. Once count was completed pills were immediately returned to the patient in their original bottle.  Medication: Morphine IR Pill/Patch Count:  14 of 90 pills remain Pill/Patch Appearance: Markings consistent with prescribed medication Bottle Appearance: Standard pharmacy container. Clearly labeled. Filled Date: 5 / 6 / 2024 Last Medication intake:  TodaySafety precautions to be maintained throughout the outpatient stay will include: orient to surroundings, keep bed in low position, maintain call bell within reach at all times, provide assistance with transfer out of bed and ambulation.

## 2023-01-24 ENCOUNTER — Ambulatory Visit (HOSPITAL_BASED_OUTPATIENT_CLINIC_OR_DEPARTMENT_OTHER)
Admission: RE | Admit: 2023-01-24 | Discharge: 2023-01-24 | Disposition: A | Discharge status: Home / Self Care | Payer: Medicare Other | Source: Ambulatory Visit | Attending: Gastroenterology | Admitting: Gastroenterology

## 2023-01-24 DIAGNOSIS — K219 Gastro-esophageal reflux disease without esophagitis: Secondary | ICD-10-CM | POA: Diagnosis not present

## 2023-01-24 DIAGNOSIS — R112 Nausea with vomiting, unspecified: Secondary | ICD-10-CM | POA: Insufficient documentation

## 2023-01-24 DIAGNOSIS — R1084 Generalized abdominal pain: Secondary | ICD-10-CM | POA: Insufficient documentation

## 2023-01-24 DIAGNOSIS — N200 Calculus of kidney: Secondary | ICD-10-CM | POA: Diagnosis not present

## 2023-01-24 MED ORDER — IOHEXOL 300 MG/ML  SOLN
100.0000 mL | Freq: Once | INTRAMUSCULAR | Status: AC | PRN
  Administered 2023-01-24: 80 mL via INTRAVENOUS

## 2023-01-26 LAB — TOXASSURE SELECT 13 (MW), URINE

## 2023-02-05 DIAGNOSIS — Z9884 Bariatric surgery status: Secondary | ICD-10-CM | POA: Diagnosis not present

## 2023-02-05 DIAGNOSIS — K319 Disease of stomach and duodenum, unspecified: Secondary | ICD-10-CM | POA: Diagnosis not present

## 2023-02-05 DIAGNOSIS — Z980 Intestinal bypass and anastomosis status: Secondary | ICD-10-CM | POA: Diagnosis not present

## 2023-02-05 DIAGNOSIS — R112 Nausea with vomiting, unspecified: Secondary | ICD-10-CM | POA: Diagnosis not present

## 2023-02-05 DIAGNOSIS — K219 Gastro-esophageal reflux disease without esophagitis: Secondary | ICD-10-CM | POA: Diagnosis not present

## 2023-02-12 DIAGNOSIS — K319 Disease of stomach and duodenum, unspecified: Secondary | ICD-10-CM | POA: Diagnosis not present

## 2023-03-05 ENCOUNTER — Other Ambulatory Visit: Payer: Self-pay | Admitting: General Surgery

## 2023-03-05 DIAGNOSIS — Z9884 Bariatric surgery status: Secondary | ICD-10-CM | POA: Diagnosis not present

## 2023-03-05 DIAGNOSIS — R1013 Epigastric pain: Secondary | ICD-10-CM | POA: Diagnosis not present

## 2023-03-05 DIAGNOSIS — R1012 Left upper quadrant pain: Secondary | ICD-10-CM | POA: Diagnosis not present

## 2023-03-05 DIAGNOSIS — K5900 Constipation, unspecified: Secondary | ICD-10-CM | POA: Diagnosis not present

## 2023-03-25 ENCOUNTER — Ambulatory Visit
Admission: RE | Admit: 2023-03-25 | Discharge: 2023-03-25 | Disposition: A | Discharge status: Home / Self Care | Payer: Medicare Other | Source: Ambulatory Visit | Attending: General Surgery | Admitting: General Surgery

## 2023-03-25 DIAGNOSIS — Z9884 Bariatric surgery status: Secondary | ICD-10-CM

## 2023-03-25 DIAGNOSIS — K219 Gastro-esophageal reflux disease without esophagitis: Secondary | ICD-10-CM | POA: Diagnosis not present

## 2023-03-25 DIAGNOSIS — R1013 Epigastric pain: Secondary | ICD-10-CM

## 2023-03-25 DIAGNOSIS — R1031 Right lower quadrant pain: Secondary | ICD-10-CM | POA: Diagnosis not present

## 2023-03-27 DIAGNOSIS — Z1231 Encounter for screening mammogram for malignant neoplasm of breast: Secondary | ICD-10-CM | POA: Diagnosis not present

## 2023-04-02 DIAGNOSIS — H04123 Dry eye syndrome of bilateral lacrimal glands: Secondary | ICD-10-CM | POA: Diagnosis not present

## 2023-04-02 DIAGNOSIS — H52223 Regular astigmatism, bilateral: Secondary | ICD-10-CM | POA: Diagnosis not present

## 2023-04-02 DIAGNOSIS — H524 Presbyopia: Secondary | ICD-10-CM | POA: Diagnosis not present

## 2023-04-02 DIAGNOSIS — H0102B Squamous blepharitis left eye, upper and lower eyelids: Secondary | ICD-10-CM | POA: Diagnosis not present

## 2023-04-02 DIAGNOSIS — H0102A Squamous blepharitis right eye, upper and lower eyelids: Secondary | ICD-10-CM | POA: Diagnosis not present

## 2023-04-08 DIAGNOSIS — Z0000 Encounter for general adult medical examination without abnormal findings: Secondary | ICD-10-CM | POA: Diagnosis not present

## 2023-04-09 DIAGNOSIS — J479 Bronchiectasis, uncomplicated: Secondary | ICD-10-CM | POA: Diagnosis not present

## 2023-04-17 ENCOUNTER — Other Ambulatory Visit: Payer: Self-pay | Admitting: *Deleted

## 2023-04-17 DIAGNOSIS — I83893 Varicose veins of bilateral lower extremities with other complications: Secondary | ICD-10-CM

## 2023-04-21 NOTE — Progress Notes (Signed)
PROVIDER NOTE: Information contained herein reflects review and annotations entered in association with encounter. Interpretation of such information and data should be left to medically-trained personnel. Information provided to patient can be located elsewhere in the medical record under "Patient Instructions". Document created using STT-dictation technology, any transcriptional errors that may result from process are unintentional.    Patient: Kari Gibbs  Service Category: E/M  Provider: Oswaldo Done, MD  DOB: 11/23/1950  DOS: 04/23/2023  Referring Provider: Gweneth Dimitri, MD  MRN: [id number]  Specialty: Interventional Pain Management  PCP: Kari Dimitri, MD  Type: Established Patient  Setting: Ambulatory outpatient    Location: Office  Delivery: Face-to-face     HPI  [name], a 72 y.o. year old female, is here today because of her No primary diagnosis found.. [name] primary complain today is Abdominal Pain  Pertinent problems: [name] has Chronic pain syndrome; Chronic flank pain (1ry area of Pain) (Left); Chronic thoracic radicular pain (Left-sided); Chronic occipital neuralgia (Left); Neuropathic pain; Neurogenic pain; History of rib fracture (4th and 8th rib) (Left); Vertebral body hemangioma (T11); Metatarsalgia of foot (Right); Chronic abdominal pain (2ry area of Pain) (Bilateral) (L>R); Primary osteoarthritis; Chronic neck pain (4th area of Pain) (Bilateral) (L>R); Cervical facet syndrome (Bilateral) (L>R); Muscle spasticity; Tremors of nervous system; Muscle twitching; Cervical spondylosis; Chronic low back pain (3ry area of Pain) (Bilateral) w/o sciatica; Chronic low back pain (Bilateral) w/ sciatica (Bilateral); Chronic sacroiliac joint pain (Bilateral); Other specified dorsopathies, sacral and sacrococcygeal region; Chronic sacroiliac joint somatic dysfunction (Bilateral); Epigastric pain; Left upper quadrant pain; Generalized pain; Headache; Rib pain  (Left); Abnormal MRI, thoracic spine open (08/26/2013); Fracture of rib (4th and 8th rib) (Left); 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 Pain Assessment: Severity of Chronic pain is reported as a 6 /10. Location: Abdomen Left/around to left mid back. Onset: More than a month ago. Quality: Aching, Burning. Timing: Constant. Modifying factor(s): meds, CBD oil. Vitals:  height is 5' 5.5" (1.664 m) and weight is 180 lb (81.6 kg). Her temperature is 97.9 F (36.6 C). Her blood pressure is 146/79 (abnormal) and her pulse is 80. Her respiration is 16 and oxygen saturation is 99%.  BMI: Estimated body mass index is 29.5 kg/m as calculated from the following:   Height as of this encounter: 5' 5.5" (1.664 m).   Weight as of this encounter: 180 lb (81.6 kg). Last encounter: 01/22/2023. Last procedure: 10/29/2022.  Reason for encounter: medication management.  The patient indicates doing well with the current medication regimen. No adverse reactions or side effects reported to the medications.   RTCB: 07/26/2023   Pharmacotherapy Assessment  Analgesic: MS IR 15 mg mg, 1 tab PO q 8 hrs (45 mg/day of morphine) MME/day: 45 mg/day.   Monitoring: [place] PMP: PDMP reviewed during this encounter.       Pharmacotherapy: No side-effects or adverse reactions reported. Compliance: No problems identified. Effectiveness: Clinically acceptable.  Nonah Mattes, RN  04/23/2023  2:04 PM  Sign when Signing Visit Nursing Pain Medication Assessment:  Safety precautions to be maintained throughout the outpatient stay will include: orient to surroundings, keep bed in low position, maintain call bell within reach at all times, provide assistance with transfer out of bed and ambulation.  Medication Inspection Compliance: Pill count conducted under aseptic conditions, in front of the patient. Neither the pills nor the  bottle was removed from the patient's  sight at any time. Once count was completed pills were immediately returned to the patient in their original bottle.  Medication: Morphine IR Pill/Patch Count:  12 of 90 pills remain Pill/Patch Appearance: Markings consistent with prescribed medication Bottle Appearance: Standard pharmacy container. Clearly labeled. Filled Date: 8 / 48 / 2024 Last Medication intake:  Yesterday    No results found for: "CBDTHCR" No results found for: "D8THCCBX" No results found for: "D9THCCBX"  UDS:  Summary  Date Value Ref Range Status  01/22/2023 Note  Final    Comment:    ==================================================================== [employer] Select 13 (MW) ==================================================================== Test                             Result       Flag       Units  Drug Present and Declared for Prescription Verification   Lorazepam                      1598         EXPECTED   ng/mg creat    Source of lorazepam is a scheduled prescription medication.    Morphine                       18727        EXPECTED   ng/mg creat   Normorphine                    780          EXPECTED   ng/mg creat    Potential sources of large amounts of morphine in the absence of    codeine include administration of morphine or use of heroin.     Normorphine is an expected metabolite of morphine.    Hydromorphone                  306          EXPECTED   ng/mg creat    Hydromorphone may be present as a metabolite of morphine;    concentrations of hydromorphone rarely exceed 5% of the morphine    concentration when this is the source of hydromorphone.  ==================================================================== Test                      Result    Flag   Units      Ref Range   Creatinine              51               mg/dL      >=16 ==================================================================== Declared Medications:  The flagging and  interpretation on this report are based on the  following declared medications.  Unexpected results may arise from  inaccuracies in the declared medications.   **Note: The testing scope of this panel includes these medications:   Lorazepam (Ativan)  Morphine (MSIR)   **Note: The testing scope of this panel does not include the  following reported medications:   Acetaminophen (Tylenol)  Benzonatate (Tessalon)  Biotin  Bupropion (Wellbutrin XL)  Buspirone (Buspar)  Cranberry  Cyanocobalamin  Denosumab (Prolia)  Diclofenac (Voltaren)  Esomeprazole (Nexium)  Estradiol (Estrace)  Eszopiclone  Folic Acid  Irbesartan (Avapro)  Iron  Lansoprazole (Prevacid)  Loratadine (Claritin)  Magnesium  Montelukast (Singulair)  Multivitamin  Naloxone (Narcan)  Polyethylene Glycol  Potassium  Probiotic  Supplement  Vitamin C  Vitamin D3 ==================================================================== For clinical consultation, please call [phone]. ====================================================================       ROS  Constitutional: Denies any fever or chills Gastrointestinal: No reported hemesis, hematochezia, vomiting, or acute GI distress Musculoskeletal: Denies any acute onset joint swelling, redness, loss of ROM, or weakness Neurological: No reported episodes of acute onset apraxia, aphasia, dysarthria, agnosia, amnesia, paralysis, loss of coordination, or loss of consciousness  Medication Review  Biotin, Cranberry, Eszopiclone, Iron-Vitamin C, LORazepam, Magnesium, Peppermint Oil, Polyethyl Glycol-Propyl Glycol, Prenatal MV & Min w/FA-DHA, RA Probiotic Gummies, Vitamin D3, acetaminophen, benzonatate, buPROPion, busPIRone, denosumab, diclofenac Sodium, esomeprazole, estradiol, irbesartan, lansoprazole, loratadine, montelukast, morphine, naloxone, potassium gluconate, and vitamin B-12  History Review  Allergy: Ms. Stigen is allergic to iohexol, ace  inhibitors, losartan potassium, neosporin [neomycin-bacitracin zn-polymyx], and bacitracin-polymyxin b. Drug: Ms. Mckown  reports no history of drug use. Alcohol:  reports current alcohol use. Tobacco:  reports that she has never smoked. She has never used smokeless tobacco. Social: Ms. Bernsen  reports that she has never smoked. She has never used smokeless tobacco. She reports current alcohol use. She reports that she does not use drugs. 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 Surgical: Ms. Stirn  has a past surgical history that includes Gastric bypass (11/13/2009); Exploratory laparotomy (2012); TMJ- LEFT--ABOUT 10 YRS AGO--LEFT SIDE--PT NOW HAS SOME PAIN LEFT JAW (Left); laparoscopy (03/04/2012); Hernia repair (2011); Abdominal hysterectomy (1988); Cholecystectomy (N/A, 05/18/2013); Shoulder surgery (Right, 06/16/2017); Endovenous ablation saphenous vein w/ laser (Left, 02/04/2018); Esophageal manometry (N/A, 04/05/2019); Video bronchoscopy (Bilateral, 07/14/2019); Open reduction internal fixation (orif) distal radial fracture (Right, 04/10/2020); Knee arthroscopy (Bilateral); and Ventral hernia repair (N/A, 12/07/2021). Family: family history includes Alzheimer's disease in her father; Hypertension in her mother.  Laboratory Chemistry Profile   Renal Lab Results  Component Value Date   BUN 19 11/15/2021   CREATININE 0.90 11/15/2021   GFRAA >60 01/04/2016   GFRNONAA >60 11/15/2021    Hepatic Lab Results  Component Value Date   AST  29 04/04/2021   ALT 30 04/04/2021   ALBUMIN 4.6 04/04/2021   ALKPHOS 89 04/04/2021   AMYLASE 30 02/27/2010   LIPASE 22 10/12/2012    Electrolytes Lab Results  Component Value Date   NA 140 11/15/2021   K 5.2 (H) 11/15/2021   CL 105 11/15/2021   CALCIUM 9.4 11/15/2021   MG 2.1 01/04/2016    Bone Lab Results  Component Value Date   25OHVITD1 49 01/04/2016   25OHVITD2 25 01/04/2016   25OHVITD3 24 01/04/2016    Inflammation (CRP: Acute Phase) (ESR: Chronic Phase) Lab Results  Component Value Date   CRP <0.5 01/04/2016   ESRSEDRATE 17 01/04/2016         Note: Above Lab results reviewed.  Recent Imaging Review  DG UGI W DOUBLE CM (HD BA) CLINICAL DATA:  Epigastric abdominal pain particularly after meals. Prior gastric bypass 2014.  EXAM: UPPER GI SERIES WITH HIGH DENSITY WITHOUT KUB  TECHNIQUE: Combined double and single contrast examination was performed using effervescent crystals, high-density barium and thin liquid barium.  FLUOROSCOPY: Radiation Exposure Index (as provided by the fluoroscopic device): 30 mGy Kerma  COMPARISON:  11/05/2012  FINDINGS: Swallowing: Appears normal.  Penetration without aspiration.  Pharynx: Prominent cricopharyngeal bar.  Esophagus: Normal appearance.  Esophageal motility: Within normal limits.  Hiatal Hernia: None.  Gastroesophageal reflux: Severe gastroesophageal reflux.  Ingested 13 mm barium tablet: Passed normally  Stomach: Normal appearance of gastric remnant.  Gastric emptying: Normal.  Small bowel: Contrast extended into the Roux limb without delay. No significant abnormality of the gastroenteric anastomosis.  IMPRESSION: 1. Severe gastroesophageal reflux. 2. Postsurgical changes of gastric bypass. 3. No esophageal stricture. 4. Prominent cricopharyngeal bar.  Electronically Signed   By: Acquanetta Belling M.D.   On: 03/25/2023 14:10 Note: Reviewed        Physical Exam  General appearance: Well  nourished, well developed, and well hydrated. In no apparent acute distress Mental status: Alert, oriented x 3 (person, place, & time)       Respiratory: No evidence of acute respiratory distress Eyes: PERLA Vitals: BP (!) 146/79   Pulse 80   Temp 97.9 F (36.6 C)   Resp 16   Ht 5' 5.5" (1.664 m)   Wt 180 lb (81.6 kg)   SpO2 99%   BMI 29.50 kg/m  BMI: Estimated body mass index is 29.5 kg/m as calculated from the following:   Height as of this encounter: 5' 5.5" (1.664 m).   Weight as of this encounter: 180 lb (81.6 kg). Ideal: Ideal body weight: 58.2 kg (128 lb 3.2 oz) Adjusted ideal body weight: 67.5 kg (148 lb 14.7 oz)  Assessment   Diagnosis Status  1. Chronic pain syndrome   2. Pharmacologic therapy   3. Chronic neck pain (4th area of Pain) (Bilateral) (L>R)   4. Chronic flank pain (1ry area of Pain) (Left)   5. Chronic abdominal pain (2ry area of Pain) (Bilateral) (L>R)   6. Encounter for medication management   7. Encounter for chronic pain management   8. Chronic use of opiate for therapeutic purpose   9. Chronic low back pain (3ry area of Pain) (Bilateral) w/o sciatica   10. Long term prescription opiate use   11. Long term (current) use of opiate analgesic    Controlled Controlled Controlled   Updated Problems: No problems updated.  Plan of Care  Problem-specific:  No problem-specific Assessment & Plan notes found for this encounter.  Ms. Keimya Kilburg has a current medication list which includes the following long-term medication(s): bupropion, diclofenac sodium, eszopiclone, lansoprazole, morphine, [START ON 05/27/2023] morphine, and [START ON 06/26/2023] morphine.  Pharmacotherapy (Medications Ordered): Meds ordered this encounter  Medications   morphine (MSIR) 15 MG tablet    Sig: Take 1 tablet (15 mg total) by mouth every 8 (eight) hours as needed. Must last 30 days.    Dispense:  90 tablet    Refill:  0    DO NOT: delete (not duplicate); no  partial-fill (will deny script to complete), no refill request (F/U required). DISPENSE: 1 day early if closed on fill date. WARN: No CNS-depressants within 8 hrs of med.   morphine (MSIR) 15 MG tablet    Sig: Take 1 tablet (15 mg total) by mouth every 8 (eight) hours as needed. Must last 30 days.    Dispense:  90 tablet    Refill:  0    DO NOT: delete (not duplicate); no partial-fill (will deny script to complete), no refill request (F/U required). DISPENSE: 1 day early if closed on fill date. WARN: No CNS-depressants within 8 hrs of med.   morphine (MSIR) 15 MG  tablet    Sig: Take 1 tablet (15 mg total) by mouth every 8 (eight) hours as needed. Must last 30 days.    Dispense:  90 tablet    Refill:  0    DO NOT: delete (not duplicate); no partial-fill (will deny script to complete), no refill request (F/U required). DISPENSE: 1 day early if closed on fill date. WARN: No CNS-depressants within 8 hrs of med.   Orders:  No orders of the defined types were placed in this encounter.  Follow-up plan:   Return in about 3 months (around 07/26/2023) for Eval-day (M,W), (F2F), (MM), 2nd Visit.      Interventional Therapies  Risk Factors  Considerations:   ALLERGY: Contrast Dye (Anaphylactic - Type)    Planned  Pending:      Under consideration:   Diagnostic left costochondral joint injection  Diagnostic left C2 + TON NB    Completed:   Therapeutic left 8th rib intercostal NB x1 (09/10/2022)  Palliative left intercostal NB (T5 to T8) x1 (03/03/2017)  Palliative left GONB Blk x2 (07/17/2016)  Palliative bilateral SI Blk x1 (04/02/2018)    Therapeutic  Palliative (PRN) options:   Palliative left intercostal NB (T5 to T8) #2  Palliative left GONB Blk #3  Palliative bilateral SI Blk #2    Pharmacotherapy  Nonopioids transferred 05/29/2020: Gabapentin and Voltaren gel       Recent Visits Date Type Provider Dept  04/23/23 Office Visit Delano Metz, MD [facility]   Showing recent visits within past 90 days and meeting all other requirements Future Appointments Date Type Provider Dept  07/21/23 Appointment Delano Metz, MD [facility]  Showing future appointments within next 90 days and meeting all other requirements  I discussed the assessment and treatment plan with the patient. The patient was provided an opportunity to ask questions and all were answered. The patient agreed with the plan and demonstrated an understanding of the instructions.  Patient advised to call back or seek an in-person evaluation if the symptoms or condition worsens.  Duration of encounter: 30 minutes.  Total time on encounter, as per AMA guidelines included both the face-to-face and non-face-to-face time personally spent by the physician and/or other qualified health care professional(s) on the day of the encounter (includes time in activities that require the physician or other qualified health care professional and does not include time in activities normally performed by clinical staff). Physician's time may include the following activities when performed: Preparing to see the patient (e.g., pre-charting review of records, searching for previously ordered imaging, lab work, and nerve conduction tests) Review of prior analgesic pharmacotherapies. Reviewing PMP Interpreting ordered tests (e.g., lab work, imaging, nerve conduction tests) Performing post-procedure evaluations, including interpretation of diagnostic procedures Obtaining and/or reviewing separately obtained history Performing a medically appropriate examination and/or evaluation Counseling and educating the patient/family/caregiver Ordering medications, tests, or procedures Referring and communicating with other health care professionals (when not separately reported) Documenting clinical information in the electronic or other health record Independently interpreting results (not separately  reported) and communicating results to the patient/ family/caregiver Care coordination (not separately reported)  Note by: Oswaldo Done, MD Date: 04/23/2023; Time: 10:49 AM

## 2023-04-23 ENCOUNTER — Encounter: Payer: Self-pay | Admitting: Pain Medicine

## 2023-04-23 ENCOUNTER — Ambulatory Visit: Payer: Medicare Other | Attending: Pain Medicine | Admitting: Pain Medicine

## 2023-04-23 DIAGNOSIS — R109 Unspecified abdominal pain: Secondary | ICD-10-CM | POA: Diagnosis not present

## 2023-04-23 DIAGNOSIS — R1012 Left upper quadrant pain: Secondary | ICD-10-CM | POA: Diagnosis not present

## 2023-04-23 DIAGNOSIS — M542 Cervicalgia: Secondary | ICD-10-CM | POA: Insufficient documentation

## 2023-04-23 DIAGNOSIS — M545 Low back pain, unspecified: Secondary | ICD-10-CM | POA: Insufficient documentation

## 2023-04-23 DIAGNOSIS — G894 Chronic pain syndrome: Secondary | ICD-10-CM | POA: Insufficient documentation

## 2023-04-23 DIAGNOSIS — G8929 Other chronic pain: Secondary | ICD-10-CM | POA: Insufficient documentation

## 2023-04-23 DIAGNOSIS — Z79899 Other long term (current) drug therapy: Secondary | ICD-10-CM | POA: Insufficient documentation

## 2023-04-23 DIAGNOSIS — Z79891 Long term (current) use of opiate analgesic: Secondary | ICD-10-CM | POA: Insufficient documentation

## 2023-04-23 DIAGNOSIS — R1011 Right upper quadrant pain: Secondary | ICD-10-CM | POA: Insufficient documentation

## 2023-04-23 MED ORDER — MORPHINE SULFATE 15 MG PO TABS
15.0000 mg | ORAL_TABLET | Freq: Three times a day (TID) | ORAL | 0 refills | Status: DC | PRN
Start: 2023-04-27 — End: 2023-07-20

## 2023-04-23 MED ORDER — MORPHINE SULFATE 15 MG PO TABS
15.0000 mg | ORAL_TABLET | Freq: Three times a day (TID) | ORAL | 0 refills | Status: DC | PRN
Start: 2023-05-27 — End: 2023-07-20

## 2023-04-23 MED ORDER — MORPHINE SULFATE 15 MG PO TABS: 15.0000 mg | ORAL_TABLET | Freq: Three times a day (TID) | ORAL | 0 refills | Status: DC | PRN

## 2023-04-23 NOTE — Progress Notes (Signed)
Nursing Pain Medication Assessment:  Safety precautions to be maintained throughout the outpatient stay will include: orient to surroundings, keep bed in low position, maintain call bell within reach at all times, provide assistance with transfer out of bed and ambulation.  Medication Inspection Compliance: Pill count conducted under aseptic conditions, in front of the patient. Neither the pills nor the bottle was removed from the patient's sight at any time. Once count was completed pills were immediately returned to the patient in their original bottle.  Medication: Morphine IR Pill/Patch Count:  12 of 90 pills remain Pill/Patch Appearance: Markings consistent with prescribed medication Bottle Appearance: Standard pharmacy container. Clearly labeled. Filled Date: 8 / 28 / 2024 Last Medication intake:  Yesterday

## 2023-04-23 NOTE — Patient Instructions (Signed)
____________________________________________________________________________________________  Opioid Pain Medication Update  To: All patients taking opioid pain medications. (I.e.: hydrocodone, hydromorphone, oxycodone, oxymorphone, morphine, codeine, methadone, tapentadol, tramadol, buprenorphine, fentanyl, etc.)  Re: Updated review of side effects and adverse reactions of opioid analgesics, as well as new information about long term effects of this class of medications.  Direct risks of long-term opioid therapy are not limited to opioid addiction and overdose. Potential medical risks include serious fractures, breathing problems during sleep, hyperalgesia, immunosuppression, chronic constipation, bowel obstruction, myocardial infarction, and tooth decay secondary to xerostomia.  Unpredictable adverse effects that can occur even if you take your medication correctly: Cognitive impairment, respiratory depression, and death. Most people think that if they take their medication "correctly", and "as instructed", that they will be safe. Nothing could be farther from the truth. In reality, a significant amount of recorded deaths associated with the use of opioids has occurred in individuals that had taken the medication for a long time, and were taking their medication correctly. The following are examples of how this can happen: Patient taking his/her medication for a long time, as instructed, without any side effects, is given a certain antibiotic or another unrelated medication, which in turn triggers a "Drug-to-drug interaction" leading to disorientation, cognitive impairment, impaired reflexes, respiratory depression or an untoward event leading to serious bodily harm or injury, including death.  Patient taking his/her medication for a long time, as instructed, without any side effects, develops an acute impairment of liver and/or kidney function. This will lead to a rapid inability of the body to  breakdown and eliminate their pain medication, which will result in effects similar to an "overdose", but with the same medicine and dose that they had always taken. This again may lead to disorientation, cognitive impairment, impaired reflexes, respiratory depression or an untoward event leading to serious bodily harm or injury, including death.  A similar problem will occur with patients as they grow older and their liver and kidney function begins to decrease as part of the aging process.  Background information: Historically, the original case for using long-term opioid therapy to treat chronic noncancer pain was based on safety assumptions that subsequent experience has called into question. In 1996, the American Pain Society and the American Academy of Pain Medicine issued a consensus statement supporting long-term opioid therapy. This statement acknowledged the dangers of opioid prescribing but concluded that the risk for addiction was low; respiratory depression induced by opioids was short-lived, occurred mainly in opioid-naive patients, and was antagonized by pain; tolerance was not a common problem; and efforts to control diversion should not constrain opioid prescribing. This has now proven to be wrong. Experience regarding the risks for opioid addiction, misuse, and overdose in community practice has failed to support these assumptions.  According to the Centers for Disease Control and Prevention, fatal overdoses involving opioid analgesics have increased sharply over the past decade. Currently, more than 96,700 people die from drug overdoses every year. Opioids are a factor in 7 out of every 10 overdose deaths. Deaths from drug overdose have surpassed motor vehicle accidents as the leading cause of death for individuals between the ages of 80 and 61.  Clinical data suggest that neuroendocrine dysfunction may be very common in both men and women, potentially causing hypogonadism, erectile  dysfunction, infertility, decreased libido, osteoporosis, and depression. Recent studies linked higher opioid dose to increased opioid-related mortality. Controlled observational studies reported that long-term opioid therapy may be associated with increased risk for cardiovascular events. Subsequent meta-analysis concluded  that the safety of long-term opioid therapy in elderly patients has not been proven.   Side Effects and adverse reactions: Common side effects: Drowsiness (sedation). Dizziness. Nausea and vomiting. Constipation. Physical dependence -- Dependence often manifests with withdrawal symptoms when opioids are discontinued or decreased. Tolerance -- As you take repeated doses of opioids, you require increased medication to experience the same effect of pain relief. Respiratory depression -- This can occur in healthy people, especially with higher doses. However, people with COPD, asthma or other lung conditions may be even more susceptible to fatal respiratory impairment.  Uncommon side effects: An increased sensitivity to feeling pain and extreme response to pain (hyperalgesia). Chronic use of opioids can lead to this. Delayed gastric emptying (the process by which the contents of your stomach are moved into your small intestine). Muscle rigidity. Immune system and hormonal dysfunction. Quick, involuntary muscle jerks (myoclonus). Arrhythmia. Itchy skin (pruritus). Dry mouth (xerostomia).  Long-term side effects: Chronic constipation. Sleep-disordered breathing (SDB). Increased risk of bone fractures. Hypothalamic-pituitary-adrenal dysregulation. Increased risk of overdose.  RISKS: Respiratory depression and death: Opioids increase the risk of respiratory depression and death.  Drug-to-drug interactions: Opioids are relatively contraindicated in combination with benzodiazepines, sleep inducers, and other central nervous system depressants. Other classes of medications  (i.e.: certain antibiotics and even over-the-counter medications) may also trigger or induce respiratory depression in some patients.  Medical conditions: Patients with pre-existing respiratory problems are at higher risk of respiratory failure and/or depression when in combination with opioid analgesics. Opioids are relatively contraindicated in some medical conditions such as central sleep apnea.   Fractures and Falls:  Opioids increase the risk and incidence of falls. This is of particular importance in elderly patients.  Endocrine System:  Long-term administration is associated with endocrine abnormalities (endocrinopathies). (Also known as Opioid-induced Endocrinopathy) Influences on both the hypothalamic-pituitary-adrenal axis?and the hypothalamic-pituitary-gonadal axis have been demonstrated with consequent hypogonadism and adrenal insufficiency in both sexes. Hypogonadism and decreased levels of dehydroepiandrosterone sulfate have been reported in men and women. Endocrine effects include: Amenorrhoea in women (abnormal absence of menstruation) Reduced libido in both sexes Decreased sexual function Erectile dysfunction in men Hypogonadisms (decreased testicular function with shrinkage of testicles) Infertility Depression and fatigue Loss of muscle mass Anxiety Depression Immune suppression Hyperalgesia Weight gain Anemia Osteoporosis Patients (particularly women of childbearing age) should avoid opioids. There is insufficient evidence to recommend routine monitoring of asymptomatic patients taking opioids in the long-term for hormonal deficiencies.  Immune System: Human studies have demonstrated that opioids have an immunomodulating effect. These effects are mediated via opioid receptors both on immune effector cells and in the central nervous system. Opioids have been demonstrated to have adverse effects on antimicrobial response and anti-tumour surveillance. Buprenorphine has  been demonstrated to have no impact on immune function.  Opioid Induced Hyperalgesia: Human studies have demonstrated that prolonged use of opioids can lead to a state of abnormal pain sensitivity, sometimes called opioid induced hyperalgesia (OIH). Opioid induced hyperalgesia is not usually seen in the absence of tolerance to opioid analgesia. Clinically, hyperalgesia may be diagnosed if the patient on long-term opioid therapy presents with increased pain. This might be qualitatively and anatomically distinct from pain related to disease progression or to breakthrough pain resulting from development of opioid tolerance. Pain associated with hyperalgesia tends to be more diffuse than the pre-existing pain and less defined in quality. Management of opioid induced hyperalgesia requires opioid dose reduction.  Cancer: Chronic opioid therapy has been associated with an increased risk of cancer  among noncancer patients with chronic pain. This association was more evident in chronic strong opioid users. Chronic opioid consumption causes significant pathological changes in the small intestine and colon. Epidemiological studies have found that there is a link between opium dependence and initiation of gastrointestinal cancers. Cancer is the second leading cause of death after cardiovascular disease. Chronic use of opioids can cause multiple conditions such as GERD, immunosuppression and renal damage as well as carcinogenic effects, which are associated with the incidence of cancers.   Mortality: Long-term opioid use has been associated with increased mortality among patients with chronic non-cancer pain (CNCP).  Prescription of long-acting opioids for chronic noncancer pain was associated with a significantly increased risk of all-cause mortality, including deaths from causes other than overdose.  Reference: Von Korff M, Kolodny A, Deyo RA, Chou R. Long-term opioid therapy reconsidered. Ann Intern Med. 2011  Sep 6;155(5):325-8. doi: 10.7326/[id number]. PMID: 64403474; PMCID: [id number]. Randon Goldsmith, Hayward RA, Dunn KM, [place] KP. Risk of adverse events in patients prescribed long-term opioids: A cohort study in the [place] [facility] Research Datalink. Eur J Pain. 2019 May;23(5):908-922. doi: 10.1002/ejp.1357. Epub 2019 Jan 31. PMID: 43329518. Colameco S, Coren JS, Ciervo CA. Continuous opioid treatment for chronic noncancer pain: a time for moderation in prescribing. Postgrad Med. 2009 Jul;121(4):61-6. doi: 10.3810/pgm.2009.07.2032. PMID: 84166063. William Hamburger RN, [place] SD, Blazina I, Cristopher Peru, Bougatsos C, Deyo RA. The effectiveness and risks of long-term opioid therapy for chronic pain: a systematic review for a [employer] of [employer] Pathways to [employer]. Ann Intern Med. 2015 Feb 17;162(4):276-86. doi: 10.7326/M14-2559. PMID: 01601093. Caryl Bis [facility], Makuc DM. NCHS Data Brief No. 22. [place]: Centers for Disease Control and Prevention; 2009. Sep, Increase in Fatal Poisonings Involving Opioid Analgesics in the [place], 1999-2006. Song IA, Choi HR, Oh TK. Long-term opioid use and mortality in patients with chronic non-cancer pain: Ten-year follow-up study in [place] from 2010 through 2019. EClinicalMedicine. 2022 Jul 18;51:101558. doi: 10.1016/j.eclinm.2022.235573. PMID: 22025427; PMCID: [id number]. Huser, W., Schubert, T., Vogelmann, T. et al. All-cause mortality in patients with long-term opioid therapy compared with non-opioid analgesics for chronic non-cancer pain: a database study. BMC Med 18, 162 (2020). http://lester.info/ Rashidian H, Karie Kirks, Malekzadeh R, Haghdoost AA. An Ecological Study of the Association between Opiate Use and Incidence of Cancers. Addict Health. 2016 Fall;8(4):252-260. PMID: 51761607; PMCID: [id number].  Our Goal: Our goal is to control your  pain with means other than the use of opioid pain medications.  Our Recommendation: Talk to your physician about coming off of these medications. We can assist you with the tapering down and stopping these medicines. Based on the new information, even if you cannot completely stop the medication, a decrease in the dose may be associated with a lesser risk. Ask for other means of controlling the pain. Decrease or eliminate those factors that significantly contribute to your pain such as smoking, obesity, and a diet heavily tilted towards "inflammatory" nutrients.  Last Updated: 02/17/2023   ____________________________________________________________________________________________     ____________________________________________________________________________________________  National Pain Medication Shortage  The [place] is experiencing worsening drug shortages. These have had a negative widespread effect on patient care and treatment. Not expected to improve any time soon. Predicted to last past 2029.   Drug shortage list (generic names) Oxycodone IR Oxycodone/APAP Oxymorphone IR Hydromorphone Hydrocodone/APAP Morphine  Where is the problem?  Manufacturing and supply level.  Will this shortage affect you?  Only if you  take any of the above pain medications.  How? You may be unable to fill your prescription.  Your pharmacist may offer a "partial fill" of your prescription. (Warning: Do not accept partial fills.) Prescriptions partially filled cannot be transferred to another pharmacy. Read our Medication Rules and Regulation. Depending on how much medicine you are dependent on, you may experience withdrawals when unable to get the medication.  Recommendations: Consider ending your dependence on opioid pain medications. Ask your pain specialist to assist you with the process. Consider switching to a medication currently not in shortage, such as Buprenorphine. Talk to your pain  specialist about this option. Consider decreasing your pain medication requirements by managing tolerance thru "Drug Holidays". This may help minimize withdrawals, should you run out of medicine. Control your pain thru the use of non-pharmacological interventional therapies.   Your prescriber: Prescribers cannot be blamed for shortages. Medication manufacturing and supply issues cannot be fixed by the prescriber.   NOTE: The prescriber is not responsible for supplying the medication, or solving supply issues. Work with your pharmacist to solve it. The patient is responsible for the decision to take or continue taking the medication and for identifying and securing a legal supply source. By law, supplying the medication is the job and responsibility of the pharmacy. The prescriber is responsible for the evaluation, monitoring, and prescribing of these medications.   Prescribers will NOT: Re-issue prescriptions that have been partially filled. Re-issue prescriptions already sent to a pharmacy.  Re-send prescriptions to a different pharmacy because yours did not have your medication. Ask pharmacist to order more medicine or transfer the prescription to another pharmacy. (Read below.)  New 2023 regulation: "April 12, 2022 Revised Regulation Allows DEA-Registered Pharmacies to Transfer Electronic Prescriptions at a Patient's Request DEA Headquarters Division - Public Information Office Patients now have the ability to request their electronic prescription be transferred to another pharmacy without having to go back to their practitioner to initiate the request. This revised regulation went into effect on Monday, April 08, 2022.     At a patient's request, a DEA-registered retail pharmacy can now transfer an electronic prescription for a controlled substance (schedules II-V) to another DEA-registered retail pharmacy. Prior to this change, patients would have to go through their practitioner to  cancel their prescription and have it re-issued to a different pharmacy. The process was taxing and time consuming for both patients and practitioners.    The Drug Enforcement Administration [facility]) published its intent to revise the process for transferring electronic prescriptions on June 30, 2020.  The final rule was published in the federal register on March 07, 2022 and went into effect 30 days later.  Under the final rule, a prescription can only be transferred once between pharmacies, and only if allowed under existing state or other applicable law. The prescription must remain in its electronic form; may not be altered in any way; and the transfer must be communicated directly between two licensed pharmacists. It's important to note, any authorized refills transfer with the original prescription, which means the entire prescription will be filled at the same pharmacy".  Reference: [email or web address].is [facility] website announcement)  [email or web address] [employer] of Justice)   [employer] / Vol. 88, No. 143 / Thursday, March 07, 2022 / Rules and Regulations DEPARTMENT OF JUSTICE  Drug Enforcement Administration  21 CFR Part 1306  [Docket No. [id number]]  RIN [id number] Transfer of Electronic Prescriptions for Schedules II-V Controlled Substances Between Pharmacies for Initial Filling  ____________________________________________________________________________________________  ____________________________________________________________________________________________  Transfer of Pain Medication between Pharmacies  Re: 2023 DEA Clarification on existing regulation  Published on DEA Website: April 12, 2022  Title: Revised Regulation Allows DEA-Registered Pharmacies to Electrical engineer Prescriptions at a Patient's  Request DEA Headquarters Division - [employer]  "Patients now have the ability to request their electronic prescription be transferred to another pharmacy without having to go back to their practitioner to initiate the request. This revised regulation went into effect on Monday, April 08, 2022.     At a patient's request, a DEA-registered retail pharmacy can now transfer an electronic prescription for a controlled substance (schedules II-V) to another DEA-registered retail pharmacy. Prior to this change, patients would have to go through their practitioner to cancel their prescription and have it re-issued to a different pharmacy. The process was taxing and time consuming for both patients and practitioners.    The Drug Enforcement Administration [facility]) published its intent to revise the process for transferring electronic prescriptions on June 30, 2020.  The final rule was published in the federal register on March 07, 2022 and went into effect 30 days later.  Under the final rule, a prescription can only be transferred once between pharmacies, and only if allowed under existing state or other applicable law. The prescription must remain in its electronic form; may not be altered in any way; and the transfer must be communicated directly between two licensed pharmacists. It's important to note, any authorized refills transfer with the original prescription, which means the entire prescription will be filled at the same pharmacy."    REFERENCES: 1. DEA website announcement [email or web address]  2. Department of Justice website  [email or web address]  3. DEPARTMENT OF JUSTICE Drug Enforcement Administration 21 CFR Part 1306 [Docket No. [id number]] RIN [id number] "Transfer of Electronic Prescriptions for Schedules II-V Controlled Substances  Between Pharmacies for Initial Filling"  ____________________________________________________________________________________________     _______________________________________________________________________  Medication Rules  Purpose: To inform patients, and their family members, of our medication rules and regulations.  Applies to: All patients receiving prescriptions from our practice (written or electronic).  Pharmacy of record: This is the pharmacy where your electronic prescriptions will be sent. Make sure we have the correct one.  Electronic prescriptions: In compliance with the [facility] Strengthen Opioid Misuse Prevention (STOP) Act of 2017 (Session Conni Elliot [phone]), effective August 12, 2018, all controlled substances must be electronically prescribed. Written prescriptions, faxing, or calling prescriptions to a pharmacy will no longer be done.  Prescription refills: These will be provided only during in-person appointments. No medications will be renewed without a "face-to-face" evaluation with your provider. Applies to all prescriptions.  NOTE: The following applies primarily to controlled substances (Opioid* Pain Medications).   Type of encounter (visit): For patients receiving controlled substances, face-to-face visits are required. (Not an option and not up to the patient.)  Patient's responsibilities: Pain Pills: Bring all pain pills to every appointment (except for procedure appointments). Pill Bottles: Bring pills in original pharmacy bottle. Bring bottle, even if empty. Always bring the bottle of the most recent fill.  Medication refills: You are responsible for knowing and keeping track of what medications you are taking and when is it that you will need a refill. The day before your appointment: write a list of all prescriptions that need to be refilled. The day of the appointment: give the list to the admitting nurse. Prescriptions will be written only  during appointments. No prescriptions will be written on procedure days. If you forget a  medication: it will not be "Called in", "Faxed", or "electronically sent". You will need to get another appointment to get these prescribed. No early refills. Do not call asking to have your prescription filled early. Partial  or short prescriptions: Occasionally your pharmacy may not have enough pills to fill your prescription.  NEVER ACCEPT a partial fill or a prescription that is short of the total amount of pills that you were prescribed.  With controlled substances the law allows 72 hours for the pharmacy to complete the prescription.  If the prescription is not completed within 72 hours, the pharmacist will require a new prescription to be written. This means that you will be short on your medicine and we WILL NOT send another prescription to complete your original prescription.  Instead, request the pharmacy to send a carrier to a nearby branch to get enough medication to provide you with your full prescription. Prescription Accuracy: You are responsible for carefully inspecting your prescriptions before leaving our office. Have the discharge nurse carefully go over each prescription with you, before taking them home. Make sure that your name is accurately spelled, that your address is correct. Check the name and dose of your medication to make sure it is accurate. Check the number of pills, and the written instructions to make sure they are clear and accurate. Make sure that you are given enough medication to last until your next medication refill appointment. Taking Medication: Take medication as prescribed. When it comes to controlled substances, taking less pills or less frequently than prescribed is permitted and encouraged. Never take more pills than instructed. Never take the medication more frequently than prescribed.  Inform other Doctors: Always inform, all of your healthcare providers, of all the  medications you take. Pain Medication from other Providers: You are not allowed to accept any additional pain medication from any other Doctor or Healthcare provider. There are two exceptions to this rule. (see below) In the event that you require additional pain medication, you are responsible for notifying us, as stated below. Cough Medicine: Often these contain an opioid, such as codeine or hydrocodone. Never accept or take cough medicine containing these opioids if you are already taking an opioid* medication. The combination may cause respiratory failure and death. Medication Agreement: You are responsible for carefully reading and following our Medication Agreement. This must be signed before receiving any prescriptions from our practice. Safely store a copy of your signed Agreement. Violations to the Agreement will result in no further prescriptions. (Additional copies of our Medication Agreement are available upon request.) Laws, Rules, & Regulations: All patients are expected to follow all [address] and [employer], [employer], Rules, [place] [employer]. Ignorance of the Laws does not constitute a valid excuse.  Illegal drugs and Controlled Substances: The use of illegal substances (including, but not limited to marijuana and its derivatives) and/or the illegal use of any controlled substances is strictly prohibited. Violation of this rule may result in the immediate and permanent discontinuation of any and all prescriptions being written by our practice. The use of any illegal substances is prohibited. Adopted CDC guidelines & recommendations: Target dosing levels will be at or below 60 MME/day. Use of benzodiazepines** is not recommended.  Exceptions: There are only two exceptions to the rule of not receiving pain medications from other Healthcare Providers. Exception #1 (Emergencies): In the event of an emergency (i.e.: accident requiring emergency care), you are allowed to receive additional pain  medication. However, you are responsible for: As soon as  you are able, call our office [phone], at any time of the day or night, and leave a message stating your name, the date and nature of the emergency, and the name and dose of the medication prescribed. In the event that your call is answered by a member of our staff, make sure to document and save the date, time, and the name of the person that took your information.  Exception #2 (Planned Surgery): In the event that you are scheduled by another doctor or dentist to have any type of surgery or procedure, you are allowed (for a period no longer than 30 days), to receive additional pain medication, for the acute post-op pain. However, in this case, you are responsible for picking up a copy of our "Post-op Pain Management for Surgeons" handout, and giving it to your surgeon or dentist. This document is available at our office, and does not require an appointment to obtain it. Simply go to our office during business hours (Monday-Thursday from 8:00 AM to 4:00 PM) (Friday 8:00 AM to 12:00 Noon) or if you have a scheduled appointment with [place], prior to your surgery, and ask for it by name. In addition, you are responsible for: calling our office [phone], at any time of the day or night, and leaving a message stating your name, name of your surgeon, type of surgery, and date of procedure or surgery. Failure to comply with your responsibilities may result in termination of therapy involving the controlled substances. Medication Agreement Violation. Following the above rules, including your responsibilities will help you in avoiding a Medication Agreement Violation ("Breaking your Pain Medication Contract").  Consequences:  Not following the above rules may result in permanent discontinuation of medication prescription therapy.  *Opioid medications include: morphine, codeine, oxycodone, oxymorphone, hydrocodone, hydromorphone, meperidine, tramadol,  tapentadol, buprenorphine, fentanyl, methadone. **Benzodiazepine medications include: diazepam (Valium), alprazolam (Xanax), clonazepam (Klonopine), lorazepam (Ativan), clorazepate (Tranxene), chlordiazepoxide (Librium), estazolam (Prosom), oxazepam (Serax), temazepam (Restoril), triazolam (Halcion) (Last updated: 06/04/2022) ______________________________________________________________________    ______________________________________________________________________  Medication Recommendations and Reminders  Applies to: All patients receiving prescriptions (written and/or electronic).  Medication Rules & Regulations: You are responsible for reading, knowing, and following our "Medication Rules" document. These exist for your safety and that of others. They are not flexible and neither are we. Dismissing or ignoring them is an act of "non-compliance" that may result in complete and irreversible termination of such medication therapy. For safety reasons, "non-compliance" will not be tolerated. As with the [place] fundamental legal principle of "ignorance of the law is no defense", we will accept no excuses for not having read and knowing the content of documents provided to you by our practice.  Pharmacy of record:  Definition: This is the pharmacy where your electronic prescriptions will be sent.  We do not endorse any particular pharmacy. It is up to you and your insurance to decide what pharmacy to use.  We do not restrict you in your choice of pharmacy. However, once we write for your prescriptions, we will NOT be re-sending more prescriptions to fix restricted supply problems created by your pharmacy, or your insurance.  The pharmacy listed in the electronic medical record should be the one where you want electronic prescriptions to be sent. If you choose to change pharmacy, simply notify our nursing staff. Changes will be made only during your regular appointments and not over the  phone.  Recommendations: Keep all of your pain medications in a safe place, under lock and key, even  if you live alone. We will NOT replace lost, stolen, or damaged medication. We do not accept "Police Reports" as proof of medications having been stolen. After you fill your prescription, take 1 week's worth of pills and put them away in a safe place. You should keep a separate, properly labeled bottle for this purpose. The remainder should be kept in the original bottle. Use this as your primary supply, until it runs out. Once it's gone, then you know that you have 1 week's worth of medicine, and it is time to come in for a prescription refill. If you do this correctly, it is unlikely that you will ever run out of medicine. To make sure that the above recommendation works, it is very important that you make sure your medication refill appointments are scheduled at least 1 week before you run out of medicine. To do this in an effective manner, make sure that you do not leave the office without scheduling your next medication management appointment. Always ask the nursing staff to show you in your prescription , when your medication will be running out. Then arrange for the receptionist to get you a return appointment, at least 7 days before you run out of medicine. Do not wait until you have 1 or 2 pills left, to come in. This is very poor planning and does not take into consideration that we may need to cancel appointments due to bad weather, sickness, or emergencies affecting our staff. DO NOT ACCEPT A "Partial Fill": If for any reason your pharmacy does not have enough pills/tablets to completely fill or refill your prescription, do not allow for a "partial fill". The law allows the pharmacy to complete that prescription within 72 hours, without requiring a new prescription. If they do not fill the rest of your prescription within those 72 hours, you will need a separate prescription to fill the remaining  amount, which we will NOT provide. If the reason for the partial fill is your insurance, you will need to talk to the pharmacist about payment alternatives for the remaining tablets, but again, DO NOT ACCEPT A PARTIAL FILL, unless you can trust your pharmacist to obtain the remainder of the pills within 72 hours.  Prescription refills and/or changes in medication(s):  Prescription refills, and/or changes in dose or medication, will be conducted only during scheduled medication management appointments. (Applies to both, written and electronic prescriptions.) No refills on procedure days. No medication will be changed or started on procedure days. No changes, adjustments, and/or refills will be conducted on a procedure day. Doing so will interfere with the diagnostic portion of the procedure. No phone refills. No medications will be "called into the pharmacy". No Fax refills. No weekend refills. No Holliday refills. No after hours refills.  Remember:  Business hours are:  Monday to Thursday 8:00 AM to 4:00 PM Provider's Schedule: Delano Metz, MD - Appointments are:  Medication management: Monday and Wednesday 8:00 AM to 4:00 PM Procedure day: Tuesday and Thursday 7:30 AM to 4:00 PM Edward Jolly, MD - Appointments are:  Medication management: Tuesday and Thursday 8:00 AM to 4:00 PM Procedure day: Monday and Wednesday 7:30 AM to 4:00 PM (Last update: 06/04/2022) ______________________________________________________________________   ____________________________________________________________________________________________  Naloxone Nasal Spray  Why am I receiving this medication? [place] STOP ACT requires that all patients taking high dose opioids or at risk of opioids respiratory depression, be prescribed an opioid reversal agent, such as Naloxone (AKA: Narcan).  What is this medication? NALOXONE (  nal OX one) treats opioid overdose, which causes slow or shallow breathing,  severe drowsiness, or trouble staying awake. Call emergency services after using this medication. You may need additional treatment. Naloxone works by reversing the effects of opioids. It belongs to a group of medications called opioid blockers.  COMMON BRAND NAME(S): Kloxxado, Narcan  What should I tell my care team before I take this medication? They need to know if you have any of these conditions: Heart disease Substance use disorder An unusual or allergic reaction to naloxone, other medications, foods, dyes, or preservatives Pregnant or trying to get pregnant Breast-feeding  When to use this medication? This medication is to be used for the treatment of respiratory depression (less than 8 breaths per minute) secondary to opioid overdose.   How to use this medication? This medication is for use in the nose. Lay the person on their back. Support their neck with your hand and allow the head to tilt back before giving the medication. The nasal spray should be given into 1 nostril. After giving the medication, move the person onto their side. Do not remove or test the nasal spray until ready to use. Get emergency medical help right away after giving the first dose of this medication, even if the person wakes up. You should be familiar with how to recognize the signs and symptoms of a narcotic overdose. If more doses are needed, give the additional dose in the other nostril. Talk to your care team about the use of this medication in children. While this medication may be prescribed for children as young as newborns for selected conditions, precautions do apply.  Naloxone Overdosage: If you think you have taken too much of this medicine contact a poison control center or emergency room at once.  NOTE: This medicine is only for you. Do not share this medicine with others.  What if I miss a dose? This does not apply.  What may interact with this medication? This is only used during an  emergency. No interactions are expected during emergency use. This list may not describe all possible interactions. Give your health care provider a list of all the medicines, herbs, non-prescription drugs, or dietary supplements you use. Also tell them if you smoke, drink alcohol, or use illegal drugs. Some items may interact with your medicine.  What should I watch for while using this medication? Keep this medication ready for use in the case of an opioid overdose. Make sure that you have the phone number of your care team and local hospital ready. You may need to have additional doses of this medication. Each nasal spray contains a single dose. Some emergencies may require additional doses. After use, bring the treated person to the nearest hospital or call 911. Make sure the treating care team knows that the person has received a dose of this medication. You will receive additional instructions on what to do during and after use of this medication before an emergency occurs.  What side effects may I notice from receiving this medication? Side effects that you should report to your care team as soon as possible: Allergic reactions--skin rash, itching, hives, swelling of the face, lips, tongue, or throat Side effects that usually do not require medical attention (report these to your care team if they continue or are bothersome): Constipation Dryness or irritation inside the nose Headache Increase in blood pressure Muscle spasms Stuffy nose Toothache This list may not describe all possible side effects. Call your doctor for  medical advice about side effects. You may report side effects to FDA at 1-800-FDA-1088.  Where should I keep my medication? Because this is an emergency medication, you should keep it with you at all times.  Keep out of the reach of children and pets. Store between 20 and 25 degrees C (68 and 77 degrees F). Do not freeze. Throw away any unused medication after the  expiration date. Keep in original box until ready to use.  NOTE: This sheet is a summary. It may not cover all possible information. If you have questions about this medicine, talk to your doctor, pharmacist, or health care provider.   2023 Elsevier/Gold Standard (2021-04-06 00:00:00)  ____________________________________________________________________________________________

## 2023-04-24 ENCOUNTER — Ambulatory Visit (HOSPITAL_COMMUNITY): Payer: Medicare Other

## 2023-04-25 DIAGNOSIS — L233 Allergic contact dermatitis due to drugs in contact with skin: Secondary | ICD-10-CM | POA: Diagnosis not present

## 2023-04-25 DIAGNOSIS — L718 Other rosacea: Secondary | ICD-10-CM | POA: Diagnosis not present

## 2023-04-29 DIAGNOSIS — H00012 Hordeolum externum right lower eyelid: Secondary | ICD-10-CM | POA: Diagnosis not present

## 2023-04-29 DIAGNOSIS — H0102B Squamous blepharitis left eye, upper and lower eyelids: Secondary | ICD-10-CM | POA: Diagnosis not present

## 2023-04-29 DIAGNOSIS — H0102A Squamous blepharitis right eye, upper and lower eyelids: Secondary | ICD-10-CM | POA: Diagnosis not present

## 2023-04-30 DIAGNOSIS — R7303 Prediabetes: Secondary | ICD-10-CM | POA: Diagnosis not present

## 2023-04-30 DIAGNOSIS — M810 Age-related osteoporosis without current pathological fracture: Secondary | ICD-10-CM | POA: Diagnosis not present

## 2023-04-30 DIAGNOSIS — K911 Postgastric surgery syndromes: Secondary | ICD-10-CM | POA: Diagnosis not present

## 2023-04-30 DIAGNOSIS — Z23 Encounter for immunization: Secondary | ICD-10-CM | POA: Diagnosis not present

## 2023-04-30 DIAGNOSIS — R7309 Other abnormal glucose: Secondary | ICD-10-CM | POA: Diagnosis not present

## 2023-05-13 DIAGNOSIS — H00012 Hordeolum externum right lower eyelid: Secondary | ICD-10-CM | POA: Diagnosis not present

## 2023-05-13 DIAGNOSIS — H0102B Squamous blepharitis left eye, upper and lower eyelids: Secondary | ICD-10-CM | POA: Diagnosis not present

## 2023-05-13 DIAGNOSIS — H0102A Squamous blepharitis right eye, upper and lower eyelids: Secondary | ICD-10-CM | POA: Diagnosis not present

## 2023-05-22 DIAGNOSIS — N200 Calculus of kidney: Secondary | ICD-10-CM | POA: Diagnosis not present

## 2023-05-27 DIAGNOSIS — M170 Bilateral primary osteoarthritis of knee: Secondary | ICD-10-CM | POA: Diagnosis not present

## 2023-05-27 DIAGNOSIS — M1712 Unilateral primary osteoarthritis, left knee: Secondary | ICD-10-CM | POA: Diagnosis not present

## 2023-05-27 DIAGNOSIS — M1711 Unilateral primary osteoarthritis, right knee: Secondary | ICD-10-CM | POA: Diagnosis not present

## 2023-06-02 DIAGNOSIS — R7303 Prediabetes: Secondary | ICD-10-CM | POA: Diagnosis not present

## 2023-06-02 DIAGNOSIS — D508 Other iron deficiency anemias: Secondary | ICD-10-CM | POA: Diagnosis not present

## 2023-06-02 DIAGNOSIS — K5900 Constipation, unspecified: Secondary | ICD-10-CM | POA: Diagnosis not present

## 2023-06-02 DIAGNOSIS — I10 Essential (primary) hypertension: Secondary | ICD-10-CM | POA: Diagnosis not present

## 2023-06-02 DIAGNOSIS — G4700 Insomnia, unspecified: Secondary | ICD-10-CM | POA: Diagnosis not present

## 2023-06-04 DIAGNOSIS — Z23 Encounter for immunization: Secondary | ICD-10-CM | POA: Diagnosis not present

## 2023-06-18 DIAGNOSIS — R053 Chronic cough: Secondary | ICD-10-CM | POA: Diagnosis not present

## 2023-06-18 DIAGNOSIS — R1012 Left upper quadrant pain: Secondary | ICD-10-CM | POA: Diagnosis not present

## 2023-06-18 DIAGNOSIS — R1310 Dysphagia, unspecified: Secondary | ICD-10-CM | POA: Diagnosis not present

## 2023-06-18 DIAGNOSIS — J392 Other diseases of pharynx: Secondary | ICD-10-CM | POA: Diagnosis not present

## 2023-06-18 DIAGNOSIS — Z9884 Bariatric surgery status: Secondary | ICD-10-CM | POA: Diagnosis not present

## 2023-06-19 DIAGNOSIS — M1732 Unilateral post-traumatic osteoarthritis, left knee: Secondary | ICD-10-CM | POA: Diagnosis not present

## 2023-06-19 DIAGNOSIS — M25662 Stiffness of left knee, not elsewhere classified: Secondary | ICD-10-CM | POA: Diagnosis not present

## 2023-06-19 DIAGNOSIS — R262 Difficulty in walking, not elsewhere classified: Secondary | ICD-10-CM | POA: Diagnosis not present

## 2023-06-23 ENCOUNTER — Telehealth: Payer: Self-pay

## 2023-06-23 ENCOUNTER — Other Ambulatory Visit: Payer: Self-pay | Admitting: Orthopedic Surgery

## 2023-06-23 NOTE — Telephone Encounter (Signed)
The patient called and said the morphine is not helping her at all. She was very distressed. She wants Dr. Laban Emperor to call her out something different. I explained that he will not do that without seeing her first. She has an appt scheduled for December.9 but wants to come in this week. He doesn't have anything this week. She said she would call back tomorrow to see if she could come in. I dont know what else to do.

## 2023-06-23 NOTE — Care Plan (Signed)
Ortho Bundle Case Management Note  Patient Details  Name: Kari Gibbs MRN: [id number] Date of Birth: 1951/03/12  Spoke with patient prior to surgery. She will discharging to home with family. Rolling walker ordered for home use. OPPT set up with [facility] [facility]. Discharge instructions discussed and questions answered. Patient and MD in agreement with plan. Choice offered                     DME Arranged:  Walker rolling DME Agency:  [employer]  HH Arranged:    HH Agency:     Additional Comments: Please contact me with any questions of if this plan should need to change.  Shauna Hugh,  RN,BSN,MHA,CCM  [facility]  [phone] 06/23/2023, 4:12 PM

## 2023-06-24 ENCOUNTER — Other Ambulatory Visit (HOSPITAL_BASED_OUTPATIENT_CLINIC_OR_DEPARTMENT_OTHER): Payer: Medicare Other | Admitting: Pain Medicine

## 2023-06-24 ENCOUNTER — Telehealth: Payer: Self-pay | Admitting: Pain Medicine

## 2023-06-24 DIAGNOSIS — G8929 Other chronic pain: Secondary | ICD-10-CM

## 2023-06-24 DIAGNOSIS — M5414 Radiculopathy, thoracic region: Secondary | ICD-10-CM

## 2023-06-24 DIAGNOSIS — G588 Other specified mononeuropathies: Secondary | ICD-10-CM

## 2023-06-24 DIAGNOSIS — Z8781 Personal history of (healed) traumatic fracture: Secondary | ICD-10-CM

## 2023-06-24 DIAGNOSIS — R109 Unspecified abdominal pain: Secondary | ICD-10-CM

## 2023-06-24 NOTE — Telephone Encounter (Signed)
Dr Laban Emperor is going to order a left ICNB.  I have spoken to her and gave her pre procedure instructions.  Please call her with an appointment as soon as possible.  Thanks.

## 2023-06-24 NOTE — Telephone Encounter (Signed)
Message sent to Dr Naveira 

## 2023-06-24 NOTE — Telephone Encounter (Signed)
PT called stated that she called and left a msg on yesterday. PT states that she is so much pain and that she can't wait until her appt to be seen. PT states that the medications isn't helping. Please give patient a call. TY

## 2023-06-24 NOTE — Progress Notes (Signed)
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  Will bring her in for an intercostal nerve block.

## 2023-06-26 ENCOUNTER — Ambulatory Visit
Admission: RE | Admit: 2023-06-26 | Discharge: 2023-06-26 | Disposition: A | Discharge status: Home / Self Care | Payer: Medicare Other | Source: Ambulatory Visit | Attending: Pain Medicine | Admitting: Pain Medicine

## 2023-06-26 ENCOUNTER — Encounter: Payer: Self-pay | Admitting: Pain Medicine

## 2023-06-26 ENCOUNTER — Ambulatory Visit: Payer: Medicare Other | Attending: Pain Medicine | Admitting: Pain Medicine

## 2023-06-26 VITALS — BP 140/59 | HR 70 | Temp 97.2°F | Resp 17 | Ht 65.0 in | Wt 180.0 lb

## 2023-06-26 DIAGNOSIS — G8929 Other chronic pain: Secondary | ICD-10-CM | POA: Diagnosis not present

## 2023-06-26 DIAGNOSIS — R0781 Pleurodynia: Secondary | ICD-10-CM | POA: Diagnosis not present

## 2023-06-26 DIAGNOSIS — M792 Neuralgia and neuritis, unspecified: Secondary | ICD-10-CM | POA: Diagnosis not present

## 2023-06-26 DIAGNOSIS — R1012 Left upper quadrant pain: Secondary | ICD-10-CM | POA: Diagnosis not present

## 2023-06-26 DIAGNOSIS — Z8781 Personal history of (healed) traumatic fracture: Secondary | ICD-10-CM | POA: Diagnosis not present

## 2023-06-26 DIAGNOSIS — G588 Other specified mononeuropathies: Secondary | ICD-10-CM | POA: Diagnosis not present

## 2023-06-26 DIAGNOSIS — M5414 Radiculopathy, thoracic region: Secondary | ICD-10-CM | POA: Diagnosis not present

## 2023-06-26 DIAGNOSIS — R109 Unspecified abdominal pain: Secondary | ICD-10-CM | POA: Insufficient documentation

## 2023-06-26 MED ORDER — ROPIVACAINE HCL 2 MG/ML IJ SOLN
9.0000 mL | Freq: Once | INTRAMUSCULAR | Status: AC
  Administered 2023-06-26: 9 mL via PERINEURAL
  Filled 2023-06-26: qty 20

## 2023-06-26 MED ORDER — PENTAFLUOROPROP-TETRAFLUOROETH EX AERO
INHALATION_SPRAY | Freq: Once | CUTANEOUS | Status: AC
  Administered 2023-06-26: 30 via TOPICAL

## 2023-06-26 MED ORDER — DEXAMETHASONE SODIUM PHOSPHATE 10 MG/ML IJ SOLN
10.0000 mg | Freq: Once | INTRAMUSCULAR | Status: AC
  Administered 2023-06-26: 10 mg
  Filled 2023-06-26: qty 1

## 2023-06-26 MED ORDER — LIDOCAINE HCL 2 % IJ SOLN
20.0000 mL | Freq: Once | INTRAMUSCULAR | Status: AC
  Administered 2023-06-26: 400 mg
  Filled 2023-06-26: qty 40

## 2023-06-26 MED ORDER — MIDAZOLAM HCL 2 MG/2ML IJ SOLN
0.5000 mg | Freq: Once | INTRAMUSCULAR | Status: AC
  Administered 2023-06-26: 2 mg via INTRAVENOUS
  Filled 2023-06-26: qty 2

## 2023-06-26 MED ORDER — LACTATED RINGERS IV SOLN: Freq: Once | INTRAVENOUS | Status: AC

## 2023-06-26 NOTE — Progress Notes (Signed)
PROVIDER NOTE: Interpretation of information contained herein should be left to medically-trained personnel. Specific patient instructions are provided elsewhere under "Patient Instructions" section of medical record. This document was created in part using STT-dictation technology, any transcriptional errors that may result from this process are unintentional.  Patient: Kari Gibbs Type: Established DOB: 07-24-1951 MRN: [id number] PCP: Gweneth Dimitri, MD  Service: Procedure DOS: 06/26/2023 Setting: Ambulatory Location: Ambulatory outpatient facility Delivery: Face-to-face Provider: Oswaldo Done, MD Specialty: Interventional Pain Management Specialty designation: 09 Location: Outpatient facility Ref. Prov.: Gweneth Dimitri, MD       Interventional Therapy   Primary Reason for Visit: Interventional Pain Management Treatment. CC: Back Pain (left)    Procedure:          Anesthesia, Analgesia, Anxiolysis:  Type: Therapeutic Posterolateral Intercostal  Nerve Block  #3  Region: Posterolateral  Thoracic Area Level: 8th Ribs Laterality: Left  Anesthesia: Local (1-2% Lidocaine)  Anxiolysis: IV  Sedation: Minimal  Guidance: Fluoroscopy Non-spinal ([id number])   Position: Lateral decubitus, tilted anteriorly (approx. 45 debrees), w/ the painful side up   1. Chronic flank pain (1ry area of Pain) (Left)   2. Chronic thoracic radicular pain (Left-sided)   3. 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 Left upper quadrant pain   6. Neurogenic pain   7. Rib pain (Left)    NAS-11 Pain score:   Pre-procedure: 8 /10   Post-procedure: 2 /10     H&P (Pre-op Assessment):  Ms. Nighswander is a 72 y.o. (year old), female patient, seen today for interventional treatment. 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 Ms. Mcclymont has a current medication list which includes the following prescription(s): acetaminophen, benzonatate, biotin, bupropion, buspirone, vitamin d3, cranberry, denosumab, diclofenac sodium, doxepin, esomeprazole, estradiol, eszopiclone, irbesartan, vitron-c, lansoprazole, loratadine, lorazepam, magnesium, montelukast, morphine, morphine, naloxone, peppermint oil, systane ultra, potassium gluconate, prenatal mv & min w/fa-dha, ra probiotic gummies, vitamin b-12, and morphine, and the following Facility-Administered Medications: lactated ringers. Her primarily concern today is the Back Pain (left)  Initial Vital Signs:  Pulse/HCG Rate: 81ECG Heart Rate: 67 Temp: (!) 97.2 F (36.2 C) Resp: 16 BP: 139/88 SpO2: 99 %  BMI: Estimated body mass index is 29.95 kg/m as calculated from the following:   Height as of this encounter: 5\' 5"  (1.651 m).   Weight as of this encounter: 180 lb (81.6 kg).  Risk Assessment: Allergies: Reviewed. She is allergic to iohexol, ace inhibitors, losartan potassium, neosporin [neomycin-bacitracin zn-polymyx], and bacitracin-polymyxin b.  Allergy Precautions: None required Coagulopathies: Reviewed. None identified.  Blood-thinner therapy: None at this time Active Infection(s): Reviewed. None identified. Ms. Styers is afebrile  Site Confirmation: Ms. Caudill was asked to confirm the procedure and laterality before marking the site Procedure checklist: Completed Consent: Before the procedure  and under the influence of no sedative(s), amnesic(s), or anxiolytics, the patient was informed of the treatment options, risks and possible complications. To fulfill our ethical and legal obligations, as recommended by the American Medical Association's Code of Ethics, I have  informed the patient of my clinical impression; the nature and purpose of the treatment or procedure; the risks, benefits, and possible complications of the intervention; the alternatives, including doing nothing; the risk(s) and benefit(s) of the alternative treatment(s) or procedure(s); and the risk(s) and benefit(s) of doing nothing. The patient was provided information about the general risks and possible complications associated with the procedure. These may include, but are not limited to: failure to achieve desired goals, infection, bleeding, organ or nerve damage, allergic reactions, paralysis, and death. In addition, the patient was informed of those risks and complications associated to the procedure, such as failure to decrease pain; infection; bleeding; organ or nerve damage with subsequent damage to sensory, motor, and/or autonomic systems, resulting in permanent pain, numbness, and/or weakness of one or several areas of the body; allergic reactions; (i.e.: anaphylactic reaction); and/or death. Furthermore, the patient was informed of those risks and complications associated with the medications. These include, but are not limited to: allergic reactions (i.e.: anaphylactic or anaphylactoid reaction(s)); adrenal axis suppression; blood sugar elevation that in diabetics may result in ketoacidosis or comma; water retention that in patients with history of congestive heart failure may result in shortness of breath, pulmonary edema, and decompensation with resultant heart failure; weight gain; swelling or edema; medication-induced neural toxicity; particulate matter embolism and blood vessel occlusion with resultant organ, and/or  nervous system infarction; and/or aseptic necrosis of one or more joints. Finally, the patient was informed that Medicine is not an exact science; therefore, there is also the possibility of unforeseen or unpredictable risks and/or possible complications that may result in a catastrophic outcome. The patient indicated having understood very clearly. We have given the patient no guarantees and we have made no promises. Enough time was given to the patient to ask questions, all of which were answered to the patient's satisfaction. Ms. Shuford has indicated that she wanted to continue with the procedure. Attestation: I, the ordering provider, attest that I have discussed with the patient the benefits, risks, side-effects, alternatives, likelihood of achieving goals, and potential problems during recovery for the procedure that I have provided informed consent. Date  Time: 06/26/2023 11:23 AM  Pre-Procedure Preparation:  Monitoring: As per clinic protocol. Respiration, ETCO2, SpO2, BP, heart rate and rhythm monitor placed and checked for adequate function Safety Precautions: Patient was assessed for positional comfort and pressure points before starting the procedure. Time-out: I initiated and conducted the "Time-out" before starting the procedure, as per protocol. The patient was asked to participate by confirming the accuracy of the "Time Out" information. Verification of the correct person, site, and procedure were performed and confirmed by me, the nursing staff, and the patient. "Time-out" conducted as per Joint Commission's Universal Protocol (UP.01.01.01). Time: 1205 Start Time: 1205 hrs.  Description of Procedure:          Target Area: The sub-costal neurovascular bundle area Approach: Sub-costal approach Area Prepped: Entire Posterolateral  Thoracic Region ChloraPrep (2% chlorhexidine gluconate and 70% isopropyl alcohol) Safety Precautions: Aspiration looking for blood return was conducted  prior to all injections. At no point did we inject any substances, as a needle was being advanced. No attempts were made at seeking any paresthesias. Safe injection practices and needle disposal techniques used. Medications properly checked for expiration dates. SDV (single dose vial) medications used. Description of the Procedure: Protocol guidelines were followed. The patient was placed in position over the procedure table. The target area was identified and the area prepped in the usual manner. Skin &  deeper tissues infiltrated with local anesthetic. After cleaning the skin with an antiseptic solution, 1-2 mL of dilute local anesthetic was infiltrated subcutaneously at the planned injection site. The fingers of the palpating hand were used to straddle the insertion site at the inferior border of the rib and fix the skin to avoid unwanted skin movement. Appropriate amount of time allowed to pass for local anesthetics to take effect. The procedure needles were then advanced to the target area at an angle of approximately 20 cephalad to the skin. Contact with the rib was made. While maintaining the same angle of insertion, the needle was walked off the inferior border of the rib as the skin was allowed to return to its initial position. Then the needle was advanced no more than 3 mm below the inferior margin of the rib. Proper needle placement was secured. Negative aspiration confirmed. Following negative aspiration for blood or air, 3-5 mL of local anesthetic was injected. The solution injected in intermittent fashion, asking for systemic symptoms every 0.5cc of injectate. The needle was then removed and the area cleansed, making sure to leave some of the prepping solution back to take advantage of its long term bactericidal properties. Vitals:   06/26/23 1201 06/26/23 1206 06/26/23 1210 06/26/23 1217  BP: (!) 152/91 133/79 132/76 (!) 140/59  Pulse: 70 70    Resp: 16 15 16 17   Temp:      SpO2: 95% 95% 97%  98%  Weight:      Height:        Start Time: 1205 hrs. End Time: 1210 hrs.   Materials:  Needle(s) Type: Regular needle Gauge: 25G Length: 1.5-in Medication(s): Please see orders for medications and dosing details.  Imaging Guidance (Non-Spinal):          Type of Imaging Technique: Fluoroscopy Guidance (Non-Spinal) Indication(s): Fluoroscopy guidance for needle placement to enhance accuracy in procedures requiring precise needle localization for targeted delivery of medication in or near specific anatomical locations not easily accessible without such real-time imaging assistance. Exposure Time: Please see nurses notes. Contrast: None used. Fluoroscopic Guidance: I was personally present during the use of fluoroscopy. "Tunnel Vision Technique" used to obtain the best possible view of the target area. Parallax error corrected before commencing the procedure. "Direction-depth-direction" technique used to introduce the needle under continuous pulsed fluoroscopy. Once target was reached, antero-posterior, oblique, and lateral fluoroscopic projection used confirm needle placement in all planes. Images permanently stored in EMR. Interpretation: No contrast injected. I personally interpreted the imaging intraoperatively. Adequate needle placement confirmed in multiple planes. Permanent images saved into the patient's record.  Antibiotic Prophylaxis:   Anti-infectives (From admission, onward)    None      Indication(s): None identified  Post-operative Assessment:  Post-procedure Vital Signs:  Pulse/HCG Rate: 7062 Temp: (!) 97.2 F (36.2 C) Resp: 17 BP: (!) 140/59 SpO2: 98 %  EBL: None  Complications: No immediate post-treatment complications observed by team, or reported by patient.  Note: The patient tolerated the entire procedure well. A repeat set of vitals were taken after the procedure and the patient was kept under observation following institutional policy, for this type  of procedure. Post-procedural neurological assessment was performed, showing return to baseline, prior to discharge. The patient was provided with post-procedure discharge instructions, including a section on how to identify potential problems. Should any problems arise concerning this procedure, the patient was given instructions to immediately contact us, at any time, without hesitation. In any case, we plan to contact  the patient by telephone for a follow-up status report regarding this interventional procedure.  Comments:  No additional relevant information.  Plan of Care (POC)  Orders:  Orders Placed This Encounter  Procedures   INTERCOSTAL NERVE BLOCK    Scheduling Instructions:     Procedure: Intercostal nerve block (ICNB)     Laterality: Left-sided     Sedation: Patient's choice.     Timeframe: Today     Indications: Injury of peripheral nerves of thorax, sequela (S24.3XXS)     CPT Code: 21308 (Multiple Levels)    Order Specific Question:   Where will this procedure be performed?    Answer:   [facility]   DG [facility] C-ARM 1-60 MIN NO REPORT    Intraoperative interpretation by procedural physician at [facility].    Standing Status:   Standing    Number of Occurrences:   1    Order Specific Question:   Reason for exam:    Answer:   Assistance in needle guidance and placement for procedures requiring needle placement in or near specific anatomical locations not easily accessible without such assistance.   Informed Consent Details: Physician/Practitioner Attestation; Transcribe to consent form and obtain patient signature    Nursing Order: Transcribe to consent form and obtain patient signature. Note: Always confirm laterality of pain with Ms. Myer Haff, before procedure.    Order Specific Question:   Physician/Practitioner attestation of informed consent for procedure/surgical case    Answer:   I, the physician/practitioner, attest that I have discussed with  the patient the benefits, risks, side effects, alternatives, likelihood of achieving goals and potential problems during recovery for the procedure that I have provided informed consent.    Order Specific Question:   Procedure    Answer:   Left intercostal nerve block    Order Specific Question:   Physician/Practitioner performing the procedure    Answer:   Dontell Mian A. Laban Emperor, MD    Order Specific Question:   Indication/Reason    Answer:   Left intercostal neuralgia/thoracic radiculitis   Provide equipment / supplies at bedside    Procedure tray: "Block Tray" (Disposable  single use) Skin infiltration needle: Regular 1.5-in, 25-G, (x1) Block Needle type: Regular Amount/quantity: 1 Size: Short(1.5-inch) Gauge: (25G x1) + (22G x1)    Standing Status:   Standing    Number of Occurrences:   1    Order Specific Question:   Specify    Answer:   Block Tray   Chronic Opioid Analgesic:  MS IR 15 mg mg, 1 tab PO q 8 hrs (45 mg/day of morphine) MME/day: 45 mg/day.   Medications ordered for procedure: Meds ordered this encounter  Medications   lidocaine (XYLOCAINE) 2 % (with pres) injection 400 mg   pentafluoroprop-tetrafluoroeth (GEBAUERS) aerosol   lactated ringers infusion   ropivacaine (PF) 2 mg/mL (0.2%) (NAROPIN) injection 9 mL   dexamethasone (DECADRON) injection 10 mg   midazolam (VERSED) injection 0.5-2 mg    Make sure Flumazenil is available in the pyxis when using this medication. If oversedation occurs, administer 0.2 mg IV over 15 sec. If after 45 sec no response, administer 0.2 mg again over 1 min; may repeat at 1 min intervals; not to exceed 4 doses (1 mg)   Medications administered: We administered lidocaine, pentafluoroprop-tetrafluoroeth, lactated ringers, ropivacaine (PF) 2 mg/mL (0.2%), dexamethasone, and midazolam.  See the medical record for exact dosing, route, and time of administration.  Follow-up plan:   Return in about 2  weeks (around 07/10/2023) for (VV),  (PPE).       Interventional Therapies  Risk Factors  Considerations:   ALLERGY: Contrast Dye (Anaphylactic - Type)    Planned  Pending:   Therapeutic left 8th rib intercostal NB #3    Under consideration:   Diagnostic left costochondral joint injection  Diagnostic left C2 + TON NB    Completed:   Therapeutic left 8th rib intercostal NB x2 (10/29/2022) (100/100/50/0) Palliative left intercostal NB (T5 to T8) x1 (03/03/2017)  Palliative left GONB Blk x2 (07/17/2016) (100/100/90/75) Palliative bilateral SI Blk x1 (04/02/2018)    Therapeutic  Palliative (PRN) options:   Palliative left intercostal NB (T5 to T8) #2  Palliative left GONB Blk #3  Palliative bilateral SI Blk #2    Pharmacotherapy  Nonopioids transferred 05/29/2020: Gabapentin and Voltaren gel        Recent Visits Date Type Provider Dept  04/23/23 Office Visit Delano Metz, MD [facility]  Showing recent visits within past 90 days and meeting all other requirements Today's Visits Date Type Provider Dept  06/26/23 Procedure visit Delano Metz, MD [facility]  Showing today's visits and meeting all other requirements Future Appointments Date Type Provider Dept  07/09/23 Appointment Delano Metz, MD [facility]  07/21/23 Appointment Delano Metz, MD [facility]  Showing future appointments within next 90 days and meeting all other requirements  Disposition: Discharge home  Discharge (Date  Time): 06/26/2023; 1220 hrs.   Primary Care Physician: Gweneth Dimitri, MD Location: [facility] Note by: Oswaldo Done, MD (TTS technology used. I apologize for any typographical errors that were not detected and corrected.) Date: 06/26/2023; Time: 12:27 PM  Disclaimer:  Medicine is not an Visual merchandiser. The only guarantee in medicine is that nothing is guaranteed. It is important to note that the decision to proceed with  this intervention was based on the information collected from the patient. The Data and conclusions were drawn from the patient's questionnaire, the interview, and the physical examination. Because the information was provided in large part by the patient, it cannot be guaranteed that it has not been purposely or unconsciously manipulated. Every effort has been made to obtain as much relevant data as possible for this evaluation. It is important to note that the conclusions that lead to this procedure are derived in large part from the available data. Always take into account that the treatment will also be dependent on availability of resources and existing treatment guidelines, considered by other Pain Management Practitioners as being common knowledge and practice, at the time of the intervention. For Medico-Legal purposes, it is also important to point out that variation in procedural techniques and pharmacological choices are the acceptable norm. The indications, contraindications, technique, and results of the above procedure should only be interpreted and judged by a Board-Certified Interventional Pain Specialist with extensive familiarity and expertise in the same exact procedure and technique.

## 2023-06-26 NOTE — Progress Notes (Signed)
Safety precautions to be maintained throughout the outpatient stay will include: orient to surroundings, keep bed in low position, maintain call bell within reach at all times, provide assistance with transfer out of bed and ambulation.  

## 2023-06-26 NOTE — Patient Instructions (Signed)

## 2023-06-27 ENCOUNTER — Telehealth: Payer: Self-pay | Admitting: *Deleted

## 2023-06-27 NOTE — Telephone Encounter (Signed)
No problems post procedure. 

## 2023-07-07 ENCOUNTER — Ambulatory Visit (HOSPITAL_COMMUNITY): Admission: RE | Admit: 2023-07-07 | Payer: Medicare Other | Source: Home / Self Care | Admitting: Orthopedic Surgery

## 2023-07-07 ENCOUNTER — Encounter (HOSPITAL_COMMUNITY): Admission: RE | Payer: Self-pay | Source: Home / Self Care

## 2023-07-07 SURGERY — ARTHROPLASTY, KNEE, TOTAL
Anesthesia: Spinal | Site: Knee | Laterality: Left

## 2023-07-08 ENCOUNTER — Encounter: Payer: Self-pay | Admitting: Pain Medicine

## 2023-07-08 NOTE — Progress Notes (Unsigned)
Patient: Kari Gibbs  Service Category: E/M  Provider: Oswaldo Done, MD  DOB: [birth year]  DOS: 07/09/2023  Location: Office  MRN: [id number]  Setting: Ambulatory outpatient  Referring Provider: Gweneth Dimitri, MD  Type: Established Patient  Specialty: Interventional Pain Management  PCP: Gweneth Dimitri, MD  Location: Remote location  Delivery: TeleHealth     Virtual Encounter - Pain Management PROVIDER NOTE: Information contained herein reflects review and annotations entered in association with encounter. Interpretation of such information and data should be left to medically-trained personnel. Information provided to patient can be located elsewhere in the medical record under "Patient Instructions". Document created using STT-dictation technology, any transcriptional errors that may result from process are unintentional.    Contact & Pharmacy Preferred: [phone] Home: [phone] (home) Mobile: [phone] (mobile) E-mail: [email or web address]  [id number] - [place], [place] - [address] AT [address] OF [facility] [address] [place] [address] Phone: [phone] Fax: [phone]  [facility]) - [place], [place] - [address] [facility] [address] [place] [place] [address] Phone: [phone] Fax: [phone]  [facility] - [place], [place] - [address] [place] [address] Phone: [phone] Fax: [phone]   Pre-screening  Kari Gibbs offered "in-person" vs "virtual" encounter. She indicated preferring virtual for this encounter.   Reason COVID-19*  Social distancing based on CDC and AMA recommendations.   I contacted Kari Gibbs on 07/09/2023 via telephone.      I clearly identified myself as Oswaldo Done, MD. I verified that I was speaking with the correct person using two identifiers (Name: Kari Gibbs,  and date of birth: 1950/12/23).  Consent I sought verbal advanced consent from Kari Gibbs for virtual visit interactions. I informed Kari Gibbs of possible security and privacy concerns, risks, and limitations associated with providing "not-in-person" medical evaluation and management services. I also informed Kari Gibbs of the availability of "in-person" appointments. Finally, I informed her that there would be a charge for the virtual visit and that she could be  personally, fully or partially, financially responsible for it. Kari Gibbs expressed understanding and agreed to proceed.   Historic Elements   Kari Gibbs is a 72 y.o. year old, female patient evaluated today after our last contact on 06/26/2023. 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 Kari Gibbs has a current medication list which  includes the following prescription(s): acetaminophen, benzonatate, biotin, bupropion, buspirone, vitamin d3, cranberry, denosumab, diclofenac sodium, doxepin, esomeprazole, estradiol, eszopiclone, irbesartan, vitron-c, lansoprazole, loratadine, lorazepam, magnesium, montelukast, morphine, morphine, morphine, naloxone, peppermint oil, systane ultra, potassium gluconate, prenatal mv & min w/fa-dha, ra probiotic gummies,  and vitamin b-12. She  reports that she has never smoked. She has never used smokeless tobacco. She reports current alcohol use. She reports that she does not use drugs. Kari Gibbs is allergic to iohexol, ace inhibitors, losartan potassium, neosporin [neomycin-bacitracin zn-polymyx], and bacitracin-polymyxin b.  BMI: Estimated body mass index is 29.95 kg/m as calculated from the following:   Height as of 06/26/23: 5\' 5"  (1.651 m).   Weight as of 06/26/23: 180 lb (81.6 kg). Last encounter: 04/23/2023. Last procedure: 06/26/2023.  HPI  Today, she is being contacted for a post-procedure assessment.  Post-procedure evaluation    Procedure:          Anesthesia, Analgesia, Anxiolysis:  Type: Therapeutic Posterolateral Intercostal  Nerve Block  #3  Region: Posterolateral  Thoracic Area Level: 8th Ribs Laterality: Left  Anesthesia: Local (1-2% Lidocaine)  Anxiolysis: IV  Sedation: Minimal  Guidance: Fluoroscopy Non-spinal ([id number])   Position: Lateral decubitus, tilted anteriorly (approx. 45 debrees), w/ the painful side up   1. Chronic flank pain (1ry area of Pain) (Left)   2. Chronic thoracic radicular pain (Left-sided)   3. 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 Left upper quadrant pain   6. Neurogenic pain   7. Rib pain (Left)    NAS-11 Pain score:   Pre-procedure: 8 /10   Post-procedure: 2 /10      Effectiveness:  Initial hour after procedure:   ***. Subsequent 4-6 hours post-procedure:    ***. Analgesia past initial 6 hours:   ***. Ongoing improvement:  Analgesic:  *** Function:    ***    ROM:    ***     Pharmacotherapy Assessment   Opioid Analgesic: MS IR 15 mg mg, 1 tab PO q 8 hrs (45 mg/day of morphine) MME/day: 45 mg/day.   Monitoring: [place] PMP: PDMP reviewed during this encounter.       Pharmacotherapy: No side-effects or adverse reactions reported. Compliance: No problems identified. Effectiveness: Clinically acceptable. Plan: Refer to "POC". UDS:  Summary  Date Value Ref Range Status  01/22/2023 Note  Final    Comment:    ==================================================================== [employer] Select 13 (MW) ==================================================================== Test                             Result       Flag       Units  Drug Present and Declared for Prescription Verification   Lorazepam                      1598         EXPECTED   ng/mg creat    Source of lorazepam is a scheduled prescription medication.    Morphine                       18727        EXPECTED   ng/mg creat   Normorphine                    780          EXPECTED   ng/mg creat    Potential sources of large amounts of morphine in the absence of    codeine include administration of morphine or use of heroin.     Normorphine is an expected metabolite of morphine.    Hydromorphone  306          EXPECTED   ng/mg creat    Hydromorphone may be present as a metabolite of morphine;    concentrations of hydromorphone rarely exceed 5% of the morphine    concentration when this is the source of hydromorphone.  ==================================================================== Test                      Result    Flag   Units      Ref Range   Creatinine              51               mg/dL      >=29 ==================================================================== Declared Medications:  The flagging and interpretation on this report are based on the   following declared medications.  Unexpected results may arise from  inaccuracies in the declared medications.   **Note: The testing scope of this panel includes these medications:   Lorazepam (Ativan)  Morphine (MSIR)   **Note: The testing scope of this panel does not include the  following reported medications:   Acetaminophen (Tylenol)  Benzonatate (Tessalon)  Biotin  Bupropion (Wellbutrin XL)  Buspirone (Buspar)  Cranberry  Cyanocobalamin  Denosumab (Prolia)  Diclofenac (Voltaren)  Esomeprazole (Nexium)  Estradiol (Estrace)  Eszopiclone  Folic Acid  Irbesartan (Avapro)  Iron  Lansoprazole (Prevacid)  Loratadine (Claritin)  Magnesium  Montelukast (Singulair)  Multivitamin  Naloxone (Narcan)  Polyethylene Glycol  Potassium  Probiotic  Supplement  Vitamin C  Vitamin D3 ==================================================================== For clinical consultation, please call [phone]. ====================================================================    No results found for: "CBDTHCR", "D8THCCBX", "D9THCCBX"   Laboratory Chemistry Profile   Renal Lab Results  Component Value Date   BUN 19 11/15/2021   CREATININE 0.90 11/15/2021   GFRAA >60 01/04/2016   GFRNONAA >60 11/15/2021    Hepatic Lab Results  Component Value Date   AST 29 04/04/2021   ALT 30 04/04/2021   ALBUMIN 4.6 04/04/2021   ALKPHOS 89 04/04/2021   AMYLASE 30 02/27/2010   LIPASE 22 10/12/2012    Electrolytes Lab Results  Component Value Date   NA 140 11/15/2021   K 5.2 (H) 11/15/2021   CL 105 11/15/2021   CALCIUM 9.4 11/15/2021   MG 2.1 01/04/2016    Bone Lab Results  Component Value Date   25OHVITD1 49 01/04/2016   25OHVITD2 25 01/04/2016   25OHVITD3 24 01/04/2016    Inflammation (CRP: Acute Phase) (ESR: Chronic Phase) Lab Results  Component Value Date   CRP <0.5 01/04/2016   ESRSEDRATE 17 01/04/2016         Note: Above Lab results reviewed.  Imaging  DG  PAIN CLINIC C-ARM 1-60 MIN NO REPORT Fluoro was used, but no Radiologist interpretation will be provided.  Please refer to "NOTES" tab for provider progress note.  Assessment  The primary encounter diagnosis was Chronic flank pain (1ry area of Pain) (Left). 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  Plan of Care  Problem-specific:  No problem-specific Assessment & Plan notes found for this encounter.  Kari Gibbs has a current medication list which includes the following long-term medication(s): bupropion, diclofenac sodium, eszopiclone, lansoprazole, morphine, morphine, and morphine.  Pharmacotherapy (Medications Ordered): No orders of the defined types were  placed in this encounter.  Orders:  No orders of the defined types were placed in this encounter.  Follow-up plan:   No follow-ups on file.      Interventional Therapies  Risk Factors  Considerations:   ALLERGY: Contrast Dye (Anaphylactic - Type)    Planned  Pending:   Therapeutic left 8th rib intercostal NB #3    Under consideration:   Diagnostic left costochondral joint injection  Diagnostic left C2 + TON NB    Completed:   Therapeutic left 8th rib intercostal NB x2 (10/29/2022) (100/100/50/0) Palliative left intercostal NB (T5 to T8) x1 (03/03/2017)  Palliative left GONB Blk x2 (07/17/2016) (100/100/90/75) Palliative bilateral SI Blk x1 (04/02/2018)    Therapeutic  Palliative (PRN) options:   Palliative left intercostal NB (T5 to T8) #2  Palliative left GONB Blk #3  Palliative bilateral SI Blk #2    Pharmacotherapy  Nonopioids transferred 05/29/2020: Gabapentin and Voltaren gel         Recent Visits Date Type Provider Dept  06/26/23 Procedure visit Delano Metz, MD [facility]  04/23/23 Office Visit Delano Metz, MD [facility]  Showing recent visits within past 90 days and meeting all other requirements Future Appointments Date Type Provider Dept  07/09/23 Appointment Delano Metz, MD [facility]  07/21/23 Appointment Delano Metz, MD [facility]  Showing future appointments within next 90 days and meeting all other requirements  I discussed the assessment and treatment plan with the patient. The patient was provided an opportunity to ask questions and all were answered. The patient agreed with the plan and demonstrated an understanding of the instructions.  Patient advised to call back or seek an in-person evaluation if the symptoms or condition worsens.  Duration of encounter: *** minutes.  Note by: Oswaldo Done, MD Date: 07/09/2023; Time: 8:28 AM

## 2023-07-09 ENCOUNTER — Ambulatory Visit: Payer: Medicare Other | Attending: Pain Medicine | Admitting: Pain Medicine

## 2023-07-09 DIAGNOSIS — M5414 Radiculopathy, thoracic region: Secondary | ICD-10-CM

## 2023-07-09 DIAGNOSIS — R109 Unspecified abdominal pain: Secondary | ICD-10-CM | POA: Diagnosis not present

## 2023-07-09 DIAGNOSIS — M792 Neuralgia and neuritis, unspecified: Secondary | ICD-10-CM

## 2023-07-09 DIAGNOSIS — Z8781 Personal history of (healed) traumatic fracture: Secondary | ICD-10-CM | POA: Diagnosis not present

## 2023-07-09 DIAGNOSIS — M5134 Other intervertebral disc degeneration, thoracic region: Secondary | ICD-10-CM | POA: Diagnosis not present

## 2023-07-09 DIAGNOSIS — G8929 Other chronic pain: Secondary | ICD-10-CM

## 2023-07-09 DIAGNOSIS — F418 Other specified anxiety disorders: Secondary | ICD-10-CM | POA: Insufficient documentation

## 2023-07-09 DIAGNOSIS — R0781 Pleurodynia: Secondary | ICD-10-CM

## 2023-07-09 DIAGNOSIS — R1012 Left upper quadrant pain: Secondary | ICD-10-CM | POA: Diagnosis not present

## 2023-07-09 DIAGNOSIS — Z09 Encounter for follow-up examination after completed treatment for conditions other than malignant neoplasm: Secondary | ICD-10-CM

## 2023-07-09 DIAGNOSIS — G588 Other specified mononeuropathies: Secondary | ICD-10-CM

## 2023-07-09 MED ORDER — PREDNISONE 20 MG PO TABS: ORAL_TABLET | ORAL | 0 refills | Status: AC

## 2023-07-09 MED ORDER — DIAZEPAM 5 MG PO TABS: 5.0000 mg | ORAL_TABLET | ORAL | 0 refills | Status: DC

## 2023-07-18 ENCOUNTER — Ambulatory Visit (HOSPITAL_COMMUNITY)
Admission: RE | Admit: 2023-07-18 | Discharge: 2023-07-18 | Disposition: A | Discharge status: Home / Self Care | Payer: Medicare Other | Source: Ambulatory Visit | Attending: Pain Medicine | Admitting: Pain Medicine

## 2023-07-18 DIAGNOSIS — M5414 Radiculopathy, thoracic region: Secondary | ICD-10-CM | POA: Diagnosis not present

## 2023-07-18 DIAGNOSIS — R109 Unspecified abdominal pain: Secondary | ICD-10-CM | POA: Diagnosis not present

## 2023-07-18 DIAGNOSIS — M438X5 Other specified deforming dorsopathies, thoracolumbar region: Secondary | ICD-10-CM | POA: Diagnosis not present

## 2023-07-18 DIAGNOSIS — M549 Dorsalgia, unspecified: Secondary | ICD-10-CM | POA: Diagnosis not present

## 2023-07-18 DIAGNOSIS — Z8781 Personal history of (healed) traumatic fracture: Secondary | ICD-10-CM | POA: Diagnosis not present

## 2023-07-18 DIAGNOSIS — M4314 Spondylolisthesis, thoracic region: Secondary | ICD-10-CM | POA: Diagnosis not present

## 2023-07-18 DIAGNOSIS — G588 Other specified mononeuropathies: Secondary | ICD-10-CM | POA: Insufficient documentation

## 2023-07-18 DIAGNOSIS — R0781 Pleurodynia: Secondary | ICD-10-CM | POA: Diagnosis not present

## 2023-07-18 DIAGNOSIS — M5134 Other intervertebral disc degeneration, thoracic region: Secondary | ICD-10-CM | POA: Diagnosis not present

## 2023-07-18 DIAGNOSIS — M792 Neuralgia and neuritis, unspecified: Secondary | ICD-10-CM | POA: Insufficient documentation

## 2023-07-18 DIAGNOSIS — G8929 Other chronic pain: Secondary | ICD-10-CM | POA: Insufficient documentation

## 2023-07-18 DIAGNOSIS — R1012 Left upper quadrant pain: Secondary | ICD-10-CM | POA: Insufficient documentation

## 2023-07-20 NOTE — Progress Notes (Unsigned)
PROVIDER NOTE: Information contained herein reflects review and annotations entered in association with encounter. Interpretation of such information and data should be left to medically-trained personnel. Information provided to patient can be located elsewhere in the medical record under "Patient Instructions". Document created using STT-dictation technology, any transcriptional errors that may result from process are unintentional.    Patient: Kari Gibbs  Service Category: E/M  Provider: Oswaldo Done, MD  DOB: 1951-06-10  DOS: 07/21/2023  Referring Provider: Gweneth Dimitri, MD  MRN: [id number]  Specialty: Interventional Pain Management  PCP: Gweneth Dimitri, MD  Type: Established Patient  Setting: Ambulatory outpatient    Location: Office  Delivery: Face-to-face     HPI  [name], a 72 y.o. year old female, is here today because of her Chronic pain syndrome [G89.4]. Ms. Schmitz primary complain today is rib cage and Other (Rib cage on the left side)  Pertinent problems: Ms. Duso has Chronic pain syndrome; Chronic flank pain (1ry area of Pain) (Left); Chronic thoracic radicular pain (Left-sided); Chronic occipital neuralgia (Left); Neuropathic pain; Neurogenic pain; History of rib fracture (4th and 8th rib) (Left); Vertebral body hemangioma (T11); Metatarsalgia of foot (Right); Chronic abdominal pain (2ry area of Pain) (Bilateral) (L>R); Primary osteoarthritis; Chronic neck pain (4th area of Pain) (Bilateral) (L>R); Cervical facet syndrome (Bilateral) (L>R); Muscle spasticity; Tremors of nervous system; Muscle twitching; Cervical spondylosis; Chronic low back pain (3ry area of Pain) (Bilateral) w/o sciatica; Chronic low back pain (Bilateral) w/ sciatica (Bilateral); Chronic sacroiliac joint pain (Bilateral); Other specified dorsopathies, sacral and sacrococcygeal region; Chronic sacroiliac joint somatic dysfunction (Bilateral); Epigastric pain; Left upper quadrant pain;  Generalized pain; Headache; Rib pain (Left); Abnormal MRI, thoracic spine open (08/26/2013); Fracture of rib (4th and 8th rib) (Left); 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 Pain Assessment: Severity of Chronic pain is reported as a 8 /10. Location: Rib cage Left/pain radiaties toward the back. Onset: More than a month ago. Quality: Throbbing, Shooting, Sharp, Aching, Constant. Timing: Constant. Modifying factor(s): ice and heat, meds. Vitals:  height is 5\' 6"  (1.676 m) and weight is 180 lb (81.6 kg). Her temperature is 97.1 F (36.2 C) (abnormal). Her blood pressure is 153/67 (abnormal) and her pulse is 88. Her oxygen saturation is 96%.  BMI: Estimated body mass index is 29.05 kg/m as calculated from the following:   Height as of this encounter: 5\' 6"  (1.676 m).   Weight as of this encounter: 180 lb (81.6 kg). Last encounter: 07/09/2023. Last procedure: 06/26/2023.  Reason for encounter: both, medication management and post-procedure evaluation and assessment.  The patient indicates doing well with the current medication regimen. No adverse reactions or side effects reported to the medications.   Discussed the use of AI scribe software for clinical note transcription with the patient, who gave verbal consent to proceed.  History of Present Illness   The patient, with a history of chronic pain, reports that her current pain medication is ineffective, likening it to taking a placebo. She describes her pain as nerve-related, and it is not alleviated by her current medication regimen. The patient has been under the care of a primary physician and recently underwent annual lab work. Last year's lab results indicated slightly elevated potassium levels, but the patient denies any known kidney or liver issues.  The patient recently underwent an MRI, the results of which are pending. She reports  that her pain was exacerbated  following physical therapy exercises in preparation for a scheduled knee replacement. The exercises included leg and knee raises and flexions. The patient stopped these exercises due to the increased pain.  In addition to her chronic pain, the patient reports difficulty swallowing, which has been attributed to a thickened cricopharyngeal muscle. This issue has been referred to an ENT specialist for further evaluation. The patient's medication regimen includes morphine, which she reports as being ineffective for her pain. She expresses concern about the potential for future issues with her prescriptions due to the complexities of the electronic medical record system.     RTCB: 10/24/2023   Post-procedure evaluation    Procedure:          Anesthesia, Analgesia, Anxiolysis:  Type: Therapeutic Posterolateral Intercostal  Nerve Block  #3  Region: Posterolateral  Thoracic Area Level: 8th Ribs Laterality: Left  Anesthesia: Local (1-2% Lidocaine)  Anxiolysis: IV  Sedation: Minimal  Guidance: Fluoroscopy Non-spinal ([id number])   Position: Lateral decubitus, tilted anteriorly (approx. 45 debrees), w/ the painful side up   1. Chronic flank pain (1ry area of Pain) (Left)   2. Chronic thoracic radicular pain (Left-sided)   3. 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 Left upper quadrant pain   6. Neurogenic pain   7. Rib pain (Left)    NAS-11 Pain score:   Pre-procedure: 8 /10   Post-procedure: 2 /10       Effectiveness:  Initial hour after procedure: 100 %. Subsequent 4-6 hours post-procedure: 0 %. Analgesia past initial 6 hours: 0 %. Ongoing improvement:  Analgesic: The patient indicates having attained 100% relief of the pain for the first hour, likely to be secondary to the sedatives.  After that she denies any type of pain relief over the affected area.  She does admit having experienced some numbness  over the area blocked by the intercostal, but it did not relieve the pain. Function: No benefit ROM: No benefit  Pharmacotherapy Assessment  Analgesic: MS IR 15 mg mg, 1 tab PO q 8 hrs (45 mg/day of morphine) MME/day: 45 mg/day.   Monitoring: [place] PMP: PDMP reviewed during this encounter.       Pharmacotherapy: No side-effects or adverse reactions reported. Compliance: No problems identified. Effectiveness: Clinically acceptable.  Brigitte Pulse, RN  07/21/2023 10:59 AM  Sign when Signing Visit Nursing Pain Medication Assessment:  Safety precautions to be maintained throughout the outpatient stay will include: orient to surroundings, keep bed in low position, maintain call bell within reach at all times, provide assistance with transfer out of bed and ambulation.  Medication Inspection Compliance: Pill count conducted under aseptic conditions, in front of the patient. Neither the pills nor the bottle was removed from the patient's sight at any time. Once count was completed pills were immediately returned to the patient in their original bottle.  Medication: Morphine IR Pill/Patch Count: 15 out of 90 Pill/Patch Appearance: Markings consistent with prescribed medication Bottle Appearance: Standard pharmacy container. Clearly labeled. Filled Date: 10 / 14 / 2024 Last Medication intake:  Yesterday    No results found for: "CBDTHCR" No results found for: "D8THCCBX" No results found for: "D9THCCBX"  UDS:  Summary  Date Value Ref Range Status  01/22/2023 Note  Final    Comment:    ==================================================================== [employer] Select 13 (MW) ==================================================================== Test  Result       Flag       Units  Drug Present and Declared for Prescription Verification   Lorazepam                      1598         EXPECTED   ng/mg creat    Source of lorazepam is a scheduled prescription  medication.    Morphine                       18727        EXPECTED   ng/mg creat   Normorphine                    780          EXPECTED   ng/mg creat    Potential sources of large amounts of morphine in the absence of    codeine include administration of morphine or use of heroin.     Normorphine is an expected metabolite of morphine.    Hydromorphone                  306          EXPECTED   ng/mg creat    Hydromorphone may be present as a metabolite of morphine;    concentrations of hydromorphone rarely exceed 5% of the morphine    concentration when this is the source of hydromorphone.  ==================================================================== Test                      Result    Flag   Units      Ref Range   Creatinine              51               mg/dL      >=81 ==================================================================== Declared Medications:  The flagging and interpretation on this report are based on the  following declared medications.  Unexpected results may arise from  inaccuracies in the declared medications.   **Note: The testing scope of this panel includes these medications:   Lorazepam (Ativan)  Morphine (MSIR)   **Note: The testing scope of this panel does not include the  following reported medications:   Acetaminophen (Tylenol)  Benzonatate (Tessalon)  Biotin  Bupropion (Wellbutrin XL)  Buspirone (Buspar)  Cranberry  Cyanocobalamin  Denosumab (Prolia)  Diclofenac (Voltaren)  Esomeprazole (Nexium)  Estradiol (Estrace)  Eszopiclone  Folic Acid  Irbesartan (Avapro)  Iron  Lansoprazole (Prevacid)  Loratadine (Claritin)  Magnesium  Montelukast (Singulair)  Multivitamin  Naloxone (Narcan)  Polyethylene Glycol  Potassium  Probiotic  Supplement  Vitamin C  Vitamin D3 ==================================================================== For clinical consultation, please call [phone]. ====================================================================       ROS  Constitutional: Denies any fever or chills Gastrointestinal: No reported hemesis, hematochezia, vomiting, or acute GI distress Musculoskeletal: Denies any acute onset joint swelling, redness, loss of ROM, or weakness Neurological: No reported episodes of acute onset apraxia, aphasia, dysarthria, agnosia, amnesia, paralysis, loss of coordination, or loss of consciousness  Medication Review  Biotin, Cranberry, Iron-Vitamin C, Magnesium, Peppermint Oil, Polyethyl Glycol-Propyl Glycol, Prenatal MV & Min w/FA-DHA, RA Probiotic Gummies, Vitamin D3, acetaminophen, benzonatate, buPROPion, busPIRone, denosumab, diazepam, diclofenac Sodium, estradiol, irbesartan, lansoprazole, loratadine, montelukast, morphine, naloxone, potassium gluconate, and vitamin B-12  History Review  Allergy: Ms. Melott is allergic to iohexol, ace inhibitors, losartan potassium,  neosporin [neomycin-bacitracin zn-polymyx], and bacitracin-polymyxin b. Drug: Ms. Flora  reports no history of drug use. Alcohol:  reports current alcohol use. Tobacco:  reports that she has never smoked. She has never used smokeless tobacco. Social: Ms. Chiappone  reports that she has never smoked. She has never used smokeless tobacco. She reports current alcohol use. She reports that she does not use drugs. 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 Surgical: Ms. Bartosh  has a past surgical history that  includes Gastric bypass (11/13/2009); Exploratory laparotomy (2012); TMJ- LEFT--ABOUT 10 YRS AGO--LEFT SIDE--PT NOW HAS SOME PAIN LEFT JAW (Left); laparoscopy (03/04/2012); Hernia repair (2011); Abdominal hysterectomy (1988); Cholecystectomy (N/A, 05/18/2013); Shoulder surgery (Right, 06/16/2017); Endovenous ablation saphenous vein w/ laser (Left, 02/04/2018); Esophageal manometry (N/A, 04/05/2019); Video bronchoscopy (Bilateral, 07/14/2019); Open reduction internal fixation (orif) distal radial fracture (Right, 04/10/2020); Knee arthroscopy (Bilateral); and Ventral hernia repair (N/A, 12/07/2021). Family: family history includes Alzheimer's disease in her father; Hypertension in her mother.  Laboratory Chemistry Profile   Renal Lab Results  Component Value Date   BUN 19 11/15/2021   CREATININE 0.90 11/15/2021   GFRAA >60 01/04/2016   GFRNONAA >60 11/15/2021    Hepatic Lab Results  Component Value Date   AST 29 04/04/2021   ALT 30 04/04/2021   ALBUMIN 4.6 04/04/2021   ALKPHOS 89 04/04/2021   AMYLASE 30 02/27/2010   LIPASE 22 10/12/2012    Electrolytes Lab Results  Component Value Date   NA 140 11/15/2021   K 5.2 (H) 11/15/2021   CL 105 11/15/2021   CALCIUM 9.4 11/15/2021   MG 2.1 01/04/2016    Bone Lab Results  Component Value Date   25OHVITD1 49 01/04/2016   25OHVITD2 25 01/04/2016   25OHVITD3 24 01/04/2016    Inflammation (CRP: Acute Phase) (ESR: Chronic Phase) Lab Results  Component Value Date   CRP <0.5 01/04/2016   ESRSEDRATE 17 01/04/2016         Note: Above Lab results reviewed.  Recent Imaging Review  DG PAIN CLINIC C-ARM 1-60 MIN NO REPORT Fluoro was used, but no Radiologist interpretation will be provided.  Please refer to "NOTES" tab for provider progress note. Note: Reviewed        Physical Exam  General appearance: Well nourished, well developed, and well hydrated. In no apparent acute distress Mental status: Alert, oriented x 3 (person, place, &  time)       Respiratory: No evidence of acute respiratory distress Eyes: PERLA Vitals: BP (!) 153/67   Pulse 88   Temp (!) 97.1 F (36.2 C)   Ht 5\' 6"  (1.676 m)   Wt 180 lb (81.6 kg)   SpO2 96%   BMI 29.05 kg/m  BMI: Estimated body mass index is 29.05 kg/m as calculated from the following:   Height as of this encounter: 5\' 6"  (1.676 m).   Weight as of this encounter: 180 lb (81.6 kg). Ideal: Ideal body weight: 59.3 kg (130 lb 11.7 oz) Adjusted ideal body weight: 68.2 kg (150 lb 7 oz)  Assessment   Diagnosis Status  1. Chronic pain syndrome   2. Chronic flank pain (1ry area of Pain) (Left)  3. Chronic thoracic radicular pain (Left-sided)   4. Vertebral body hemangioma (T11)   5. Closed fracture of one rib of left side, sequela   6. Chronic low back pain (3ry area of Pain) (Bilateral) w/o sciatica   7. Chronic abdominal pain (2ry area of Pain) (Bilateral) (L>R)   8. Chronic neck pain (4th area of Pain) (Bilateral) (L>R)   9. Pharmacologic therapy   10. Chronic use of opiate for therapeutic purpose   11. Encounter for medication management   12. Encounter for chronic pain management    Controlled Controlled Controlled   Updated Problems: No problems updated.  Plan of Care  Problem-specific:  Assessment and Plan    Chronic Pain with Suspected Neuropathic Component   She exhibits chronic left-sided pain, likely of a neuropathic origin. Previous intercostal nerve block proved ineffective, indicating a proximal source of pain. Preliminary MRI findings do not show central spinal stenosis or a bulging disc. We discussed proceeding with a thoracic epidural block should the current injection fail to alleviate symptoms. Should her pain persist, further evaluation for central involvement, including the spinal cord or brain, will be necessary. Today, we will administer a pain relief injection, schedule a thoracic epidural block, and review the official MRI report.  Preoperative  Physical Therapy-Induced Pain   Her left-sided pain has increased following preoperative physical therapy for knee surgery, likely exacerbated by the exercises. We will consult with the physical therapist to modify the exercises accordingly.  Medication Management   Her current pain medication is ineffective, yet no side effects have been reported. We emphasized the importance of accurate medication reconciliation and advised against self-editing medication lists to prevent prescription issues. We will ensure accurate medication reconciliation and educate her on the risks of self-editing medication lists.  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 We have referred her to an ENT for further evaluation and will include the surgeon's initial diagnosis in her chart.  Follow-up   We will follow up with the ENT on December 31st and monitor her response to today's pain relief injection to assess the need for a thoracic epidural block.       Ms. Tarrie Zaluski has a current medication list which includes the following long-term medication(s): bupropion, diclofenac sodium, lansoprazole, [START ON 07/26/2023] morphine, [START ON 08/25/2023] morphine, and [START ON 09/24/2023] morphine.  Pharmacotherapy (Medications Ordered): Meds ordered this encounter  Medications   morphine (MSIR) 15 MG tablet    Sig: Take 1 tablet (15 mg total) by mouth every 8 (eight) hours as needed. Must last 30 days.    Dispense:  90 tablet    Refill:  0    DO NOT: delete (not duplicate); no partial-fill (will deny script to complete), no refill request (F/U required). DISPENSE: 1 day early if closed on fill date. WARN: No CNS-depressants within 8 hrs of med.   morphine (MSIR) 15 MG tablet    Sig: Take 1 tablet (15 mg total) by mouth every 8 (eight) hours as needed. Must last 30 days.    Dispense:  90 tablet     Refill:  0    DO NOT: delete (not duplicate); no partial-fill (will deny script to complete), no refill request (F/U required). DISPENSE: 1 day early if closed on fill date. WARN: No CNS-depressants within 8 hrs of med.   morphine (MSIR) 15 MG tablet    Sig: Take 1 tablet (15 mg total) by  mouth every 8 (eight) hours as needed. Must last 30 days.    Dispense:  90 tablet    Refill:  0    DO NOT: delete (not duplicate); no partial-fill (will deny script to complete), no refill request (F/U required). DISPENSE: 1 day early if closed on fill date. WARN: No CNS-depressants within 8 hrs of med.   ketorolac (TORADOL) injection 60 mg   methocarbamol (ROBAXIN) injection 200 mg   Orders:  Orders Placed This Encounter  Procedures   Thoracic Epidural Injection    Standing Status:   Future    Standing Expiration Date:   10/19/2023    Scheduling Instructions:     Level: TBD     Laterality: Left-sided     Sedation: With Sedation.     Timeframe: ASAP    Order Specific Question:   Where will this procedure be performed?    Answer:   [facility]   Informed Consent Details: Physician/Practitioner Attestation; Transcribe to consent form and obtain patient signature    Do not administer NSAIDs (Toradol, etc.) if patient has an allergy or intolerance to NSAIDs or if patient has CKD (chronic kidney disease or failure). Avoid the use of Muscle Relaxants (orphenedrine/Norflex, etc.) if patient has allergy or intolerance to this medication.    Scheduling Instructions:     Nursing orders: Complete pain questionnaire before administration of therapy. Document location, laterality, onset, level, trigger, and current medications taken for the pain.    Order Specific Question:   Physician/Practitioner attestation of informed consent for procedure/surgical case    Answer:   I, the physician/practitioner, attest that I have discussed with the patient the benefits, risks, side effects, alternatives, likelihood of  achieving goals and potential problems during recovery for the procedure that I have provided informed consent.    Order Specific Question:   Procedure    Answer:   IM injection of therapeutic substance    Order Specific Question:   Physician/Practitioner performing the procedure    Answer:   Janani Chamber A. Laban Emperor, MD    Order Specific Question:   Indication/Reason    Answer:   Acute on chronic pain   Follow-up plan:   Return for (ECT): (L) Thoracic ESI #1.      Interventional Therapies  Risk Factors  Considerations:   ALLERGY: Contrast Dye (Anaphylactic - Type)    Planned  Pending:   Diagnostic thoracic MRI Prednisone taper to help with the acute left intercostal/thoracic radicular pain Prescription for Valium 5 mg (2 pills) to help with the anxiety and discomfort from the thoracic MRI   Under consideration:   Diagnostic left costochondral joint injection  Diagnostic left C2 + TON NB    Completed:   Therapeutic left 8th rib intercostal NB x3 (06/26/2023) (100/100/50/0)  Palliative left intercostal NB (T5 to T8) x1 (03/03/2017)  Palliative left GONB Blk x2 (07/17/2016) (100/100/90/75) Palliative bilateral SI Blk x1 (04/02/2018)    Therapeutic  Palliative (PRN) options:   Palliative left intercostal NB (T5 to T8) #2  Palliative left GONB Blk #3  Palliative bilateral SI Blk #2    Pharmacotherapy  Nonopioids transferred 05/29/2020: Gabapentin and Voltaren gel       Recent Visits Date Type Provider Dept  07/09/23 Office Visit Delano Metz, MD [facility]  06/26/23 Procedure visit Delano Metz, MD [facility]  04/23/23 Office Visit Delano Metz, MD [facility]  Showing recent visits within past 90 days and meeting all other requirements Today's Visits Date  Type Provider Dept  07/21/23 Office Visit Delano Metz, MD [facility]  Showing today's visits and meeting all other requirements Future  Appointments Date Type Provider Dept  07/29/23 Appointment Delano Metz, MD [facility]  Showing future appointments within next 90 days and meeting all other requirements  I discussed the assessment and treatment plan with the patient. The patient was provided an opportunity to ask questions and all were answered. The patient agreed with the plan and demonstrated an understanding of the instructions.  Patient advised to call back or seek an in-person evaluation if the symptoms or condition worsens.  Duration of encounter: 30 minutes.  Total time on encounter, as per AMA guidelines included both the face-to-face and non-face-to-face time personally spent by the physician and/or other qualified health care professional(s) on the day of the encounter (includes time in activities that require the physician or other qualified health care professional and does not include time in activities normally performed by clinical staff). Physician's time may include the following activities when performed: Preparing to see the patient (e.g., pre-charting review of records, searching for previously ordered imaging, lab work, and nerve conduction tests) Review of prior analgesic pharmacotherapies. Reviewing PMP Interpreting ordered tests (e.g., lab work, imaging, nerve conduction tests) Performing post-procedure evaluations, including interpretation of diagnostic procedures Obtaining and/or reviewing separately obtained history Performing a medically appropriate examination and/or evaluation Counseling and educating the patient/family/caregiver Ordering medications, tests, or procedures Referring and communicating with other health care professionals (when not separately reported) Documenting clinical information in the electronic or other health record Independently interpreting results (not separately reported) and communicating results to the patient/ family/caregiver Care coordination (not  separately reported)  Note by: Oswaldo Done, MD Date: 07/21/2023; Time: 12:04 PM

## 2023-07-20 NOTE — Patient Instructions (Incomplete)

## 2023-07-21 ENCOUNTER — Encounter: Payer: Self-pay | Admitting: Pain Medicine

## 2023-07-21 ENCOUNTER — Ambulatory Visit: Payer: Medicare Other | Attending: Pain Medicine | Admitting: Pain Medicine

## 2023-07-21 VITALS — BP 153/67 | HR 88 | Temp 97.1°F | Ht 66.0 in | Wt 180.0 lb

## 2023-07-21 DIAGNOSIS — M545 Low back pain, unspecified: Secondary | ICD-10-CM | POA: Insufficient documentation

## 2023-07-21 DIAGNOSIS — Z79899 Other long term (current) drug therapy: Secondary | ICD-10-CM | POA: Insufficient documentation

## 2023-07-21 DIAGNOSIS — R1012 Left upper quadrant pain: Secondary | ICD-10-CM | POA: Diagnosis not present

## 2023-07-21 DIAGNOSIS — Z79891 Long term (current) use of opiate analgesic: Secondary | ICD-10-CM | POA: Insufficient documentation

## 2023-07-21 DIAGNOSIS — S2232XS Fracture of one rib, left side, sequela: Secondary | ICD-10-CM | POA: Insufficient documentation

## 2023-07-21 DIAGNOSIS — R1011 Right upper quadrant pain: Secondary | ICD-10-CM | POA: Diagnosis not present

## 2023-07-21 DIAGNOSIS — G894 Chronic pain syndrome: Secondary | ICD-10-CM | POA: Insufficient documentation

## 2023-07-21 DIAGNOSIS — M5414 Radiculopathy, thoracic region: Secondary | ICD-10-CM | POA: Diagnosis not present

## 2023-07-21 DIAGNOSIS — M542 Cervicalgia: Secondary | ICD-10-CM | POA: Insufficient documentation

## 2023-07-21 DIAGNOSIS — G8929 Other chronic pain: Secondary | ICD-10-CM | POA: Insufficient documentation

## 2023-07-21 DIAGNOSIS — R109 Unspecified abdominal pain: Secondary | ICD-10-CM | POA: Diagnosis not present

## 2023-07-21 DIAGNOSIS — D1809 Hemangioma of other sites: Secondary | ICD-10-CM | POA: Insufficient documentation

## 2023-07-21 DIAGNOSIS — X58XXXS Exposure to other specified factors, sequela: Secondary | ICD-10-CM | POA: Diagnosis not present

## 2023-07-21 MED ORDER — MORPHINE SULFATE 15 MG PO TABS: 15.0000 mg | ORAL_TABLET | Freq: Three times a day (TID) | ORAL | 0 refills | Status: DC | PRN

## 2023-07-21 MED ORDER — KETOROLAC TROMETHAMINE 60 MG/2ML IM SOLN
60.0000 mg | Freq: Once | INTRAMUSCULAR | Status: AC
Start: 2023-07-21 — End: 2023-07-21
  Administered 2023-07-21: 60 mg via INTRAMUSCULAR
  Filled 2023-07-21: qty 2

## 2023-07-21 MED ORDER — METHOCARBAMOL 1000 MG/10ML IJ SOLN
200.0000 mg | Freq: Once | INTRAMUSCULAR | Status: AC
  Administered 2023-07-21: 200 mg via INTRAMUSCULAR
  Filled 2023-07-21: qty 10

## 2023-07-21 NOTE — Progress Notes (Signed)
Nursing Pain Medication Assessment:  Safety precautions to be maintained throughout the outpatient stay will include: orient to surroundings, keep bed in low position, maintain call bell within reach at all times, provide assistance with transfer out of bed and ambulation.  Medication Inspection Compliance: Pill count conducted under aseptic conditions, in front of the patient. Neither the pills nor the bottle was removed from the patient's sight at any time. Once count was completed pills were immediately returned to the patient in their original bottle.  Medication: Morphine IR Pill/Patch Count: 15 out of 90 Pill/Patch Appearance: Markings consistent with prescribed medication Bottle Appearance: Standard pharmacy container. Clearly labeled. Filled Date: 42 / 14 / 2024 Last Medication intake:  Yesterday

## 2023-07-29 ENCOUNTER — Ambulatory Visit: Payer: Medicare Other | Attending: Pain Medicine | Admitting: Pain Medicine

## 2023-07-29 ENCOUNTER — Ambulatory Visit
Admission: RE | Admit: 2023-07-29 | Discharge: 2023-07-29 | Disposition: A | Discharge status: Home / Self Care | Payer: Medicare Other | Source: Ambulatory Visit | Attending: Pain Medicine | Admitting: Pain Medicine

## 2023-07-29 ENCOUNTER — Encounter: Payer: Self-pay | Admitting: Pain Medicine

## 2023-07-29 VITALS — BP 121/65 | HR 76 | Temp 97.3°F | Resp 13 | Ht 66.0 in | Wt 185.0 lb

## 2023-07-29 DIAGNOSIS — R1011 Right upper quadrant pain: Secondary | ICD-10-CM | POA: Diagnosis not present

## 2023-07-29 DIAGNOSIS — Z87892 Personal history of anaphylaxis: Secondary | ICD-10-CM | POA: Diagnosis not present

## 2023-07-29 DIAGNOSIS — G8929 Other chronic pain: Secondary | ICD-10-CM | POA: Diagnosis not present

## 2023-07-29 DIAGNOSIS — G588 Other specified mononeuropathies: Secondary | ICD-10-CM | POA: Insufficient documentation

## 2023-07-29 DIAGNOSIS — M5414 Radiculopathy, thoracic region: Secondary | ICD-10-CM | POA: Insufficient documentation

## 2023-07-29 DIAGNOSIS — R1012 Left upper quadrant pain: Secondary | ICD-10-CM | POA: Insufficient documentation

## 2023-07-29 DIAGNOSIS — S2232XS Fracture of one rib, left side, sequela: Secondary | ICD-10-CM | POA: Diagnosis not present

## 2023-07-29 DIAGNOSIS — R937 Abnormal findings on diagnostic imaging of other parts of musculoskeletal system: Secondary | ICD-10-CM | POA: Diagnosis not present

## 2023-07-29 DIAGNOSIS — R109 Unspecified abdominal pain: Secondary | ICD-10-CM | POA: Diagnosis not present

## 2023-07-29 DIAGNOSIS — Z91041 Radiographic dye allergy status: Secondary | ICD-10-CM | POA: Diagnosis not present

## 2023-07-29 MED ORDER — MIDAZOLAM HCL 5 MG/5ML IJ SOLN
0.5000 mg | Freq: Once | INTRAMUSCULAR | Status: AC
  Administered 2023-07-29: 2 mg via INTRAVENOUS
  Administered 2023-07-29: 1 mg via INTRAVENOUS

## 2023-07-29 MED ORDER — MIDAZOLAM HCL 5 MG/5ML IJ SOLN
INTRAMUSCULAR | Status: AC
Start: 2023-07-29 — End: ?
  Filled 2023-07-29: qty 5

## 2023-07-29 MED ORDER — PENTAFLUOROPROP-TETRAFLUOROETH EX AERO: INHALATION_SPRAY | Freq: Once | CUTANEOUS | Status: DC

## 2023-07-29 MED ORDER — LIDOCAINE HCL 2 % IJ SOLN
20.0000 mL | Freq: Once | INTRAMUSCULAR | Status: AC
  Administered 2023-07-29: 400 mg

## 2023-07-29 MED ORDER — IOHEXOL 180 MG/ML  SOLN
INTRAMUSCULAR | Status: AC
  Filled 2023-07-29: qty 10

## 2023-07-29 MED ORDER — LIDOCAINE HCL 2 % IJ SOLN
INTRAMUSCULAR | Status: AC
  Filled 2023-07-29: qty 20

## 2023-07-29 MED ORDER — FENTANYL CITRATE (PF) 100 MCG/2ML IJ SOLN
INTRAMUSCULAR | Status: AC
  Filled 2023-07-29: qty 2

## 2023-07-29 MED ORDER — DEXAMETHASONE SODIUM PHOSPHATE 10 MG/ML IJ SOLN
INTRAMUSCULAR | Status: AC
  Filled 2023-07-29: qty 1

## 2023-07-29 MED ORDER — FENTANYL CITRATE (PF) 100 MCG/2ML IJ SOLN
25.0000 ug | INTRAMUSCULAR | Status: DC | PRN
Start: 2023-07-29 — End: 2023-07-29
  Administered 2023-07-29: 50 ug via INTRAVENOUS

## 2023-07-29 MED ORDER — SODIUM CHLORIDE (PF) 0.9 % IJ SOLN
INTRAMUSCULAR | Status: AC
  Filled 2023-07-29: qty 10

## 2023-07-29 MED ORDER — ROPIVACAINE HCL 2 MG/ML IJ SOLN: 2.0000 mL | Freq: Once | INTRAMUSCULAR | Status: DC

## 2023-07-29 MED ORDER — SODIUM CHLORIDE 0.9% FLUSH: 2.0000 mL | Freq: Once | INTRAVENOUS | Status: DC

## 2023-07-29 MED ORDER — LACTATED RINGERS IV SOLN: Freq: Once | INTRAVENOUS | Status: AC

## 2023-07-29 MED ORDER — ROPIVACAINE HCL 2 MG/ML IJ SOLN
INTRAMUSCULAR | Status: AC
  Filled 2023-07-29: qty 20

## 2023-07-29 MED ORDER — DEXAMETHASONE SODIUM PHOSPHATE 10 MG/ML IJ SOLN
10.0000 mg | Freq: Once | INTRAMUSCULAR | Status: DC
Start: 2023-07-29 — End: 2023-07-29

## 2023-07-29 NOTE — Patient Instructions (Signed)

## 2023-07-29 NOTE — Progress Notes (Signed)
PROVIDER NOTE: Interpretation of information contained herein should be left to medically-trained personnel. Specific patient instructions are provided elsewhere under "Patient Instructions" section of medical record. This document was created in part using STT-dictation technology, any transcriptional errors that may result from this process are unintentional.  Patient: Kari Gibbs Type: Established DOB: [birth year] MRN: [id number] PCP: Gweneth Dimitri, MD  Service: Procedure DOS: 07/29/2023 Setting: Ambulatory Location: Ambulatory outpatient facility Delivery: Face-to-face Provider: Oswaldo Done, MD Specialty: Interventional Pain Management Specialty designation: 09 Location: Outpatient facility Ref. Prov.: Gweneth Dimitri, MD       Interventional Therapy   Primary Reason for Visit: Interventional Pain Management Treatment. CC: Back Pain (Left, middle)  Procedure:           Inter-Laminar Thoracic Epidural Steroid Block/Injection  #1  Laterality:  Left Level: T8-9  Imaging: Fluoroscopic guidance Anesthesia: Local anesthesia (1-2% Lidocaine) Anxiolysis: IV Sedation: None.  DOS: 07/29/2023 Performed by: Oswaldo Done, MD  Purpose: Diagnostic/Therapeutic Indications: Thoracic back pain, radicular pain, with degenerative disc disease severe enough to impact quality of life or function. 1. Chronic flank pain (1ry area of Pain) (Left)   2. Chronic abdominal pain (2ry area of Pain) (Bilateral) (L>R)   3. Chronic thoracic radicular pain (Left-sided)   4. 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 Abnormal MRI, thoracic spine open (08/26/2013)   7. History Allergy to Contrast   8. History of drug-induced anaphylaxis    NAS-11 Pain score:   Pre-procedure: 6 /10   Post-procedure: 0-No pain/10     Position / Prep / Materials:  Position: Prone  Prep solution: ChloraPrep (2% chlorhexidine gluconate and 70% isopropyl  alcohol) Prep Area: Posterior Thoracolumbar (Upper back from shoulders to lower lumbar region).  Materials:  Tray: Epidural Needle(s) Type: Epidural needle Gauge (G): 17 Length: Regular (3.5-in) Qty: 1  H&P (Pre-op Assessment):  Kari Gibbs is a 72 y.o. (year old), female patient, seen today for interventional treatment. 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 Kari Gibbs has a current medication list which includes the following prescription(s): acetaminophen, benzonatate, biotin, bupropion, buspirone, vitamin d3, cranberry, denosumab, diazepam, diclofenac sodium, estradiol, irbesartan, vitron-c, lansoprazole, loratadine, magnesium, montelukast, morphine, [START ON 08/25/2023] morphine, [START ON 09/24/2023] morphine, naloxone, peppermint oil, systane ultra, potassium gluconate, prenatal mv & min w/fa-dha, ra probiotic gummies, and vitamin b-12, and the following Facility-Administered Medications: dexamethasone, fentanyl, pentafluoroprop-tetrafluoroeth, ropivacaine (pf) 2 mg/ml (0.2%), and sodium chloride flush. Her primarily concern today is the Back Pain (Left, middle)  Initial Vital Signs:  Pulse/HCG Rate: 79ECG Heart Rate: 76 Temp: (!) 96.6 F (35.9 C) Resp: 16 BP: (!) 142/72 SpO2: 98 %  BMI: Estimated body mass index is 29.86 kg/m as calculated from the following:   Height as of this encounter:  5\' 6"  (1.676 m).   Weight as of this encounter: 185 lb (83.9 kg).  Risk Assessment: Allergies: Reviewed. She is allergic to ace inhibitors, losartan potassium, neosporin [neomycin-bacitracin zn-polymyx], and bacitracin-polymyxin b.  Allergy Precautions: None required Coagulopathies: Reviewed. None identified.  Blood-thinner therapy: None at this time Active Infection(s): Reviewed. None identified. Kari Gibbs is afebrile  Site Confirmation: Kari Gibbs was asked to confirm the procedure and laterality before marking the site Procedure checklist: Completed Consent: Before the procedure and under the influence of no sedative(s), amnesic(s), or anxiolytics, the patient was informed of the treatment options, risks and possible complications. To fulfill our ethical and legal obligations, as recommended by the American Medical Association's Code of Ethics, I have informed the patient of my clinical impression; the nature and purpose of the treatment or procedure; the risks, benefits, and possible complications of the intervention; the alternatives, including doing nothing; the risk(s) and benefit(s) of the alternative treatment(s) or procedure(s); and the risk(s) and benefit(s) of doing nothing. The patient was provided information about the general risks and possible complications associated with the procedure. These may include, but are not limited to: failure to achieve desired goals, infection, bleeding, organ or nerve damage, allergic reactions, paralysis, and death. In addition, the patient was informed of those risks and complications associated to Spine-related procedures, such as failure to decrease pain; infection (i.e.: Meningitis, epidural or intraspinal abscess); bleeding (i.e.: epidural hematoma, subarachnoid hemorrhage, or any other type of intraspinal or peri-dural bleeding); organ or nerve damage (i.e.: Any type of peripheral nerve, nerve root, or spinal cord injury) with subsequent  damage to sensory, motor, and/or autonomic systems, resulting in permanent pain, numbness, and/or weakness of one or several areas of the body; allergic reactions; (i.e.: anaphylactic reaction); and/or death. Furthermore, the patient was informed of those risks and complications associated with the medications. These include, but are not limited to: allergic reactions (i.e.: anaphylactic or anaphylactoid reaction(s)); adrenal axis suppression; blood sugar elevation that in diabetics may result in ketoacidosis or comma; water retention that in patients with history of congestive heart failure may result in shortness of breath, pulmonary edema, and decompensation with resultant heart failure; weight gain; swelling or edema; medication-induced neural toxicity; particulate matter embolism and blood vessel occlusion with resultant organ, and/or nervous system infarction; and/or aseptic necrosis of one or more joints. Finally, the patient was informed that Medicine is not an exact science; therefore, there is also the possibility of unforeseen or unpredictable risks and/or possible complications that may result in a catastrophic outcome. The patient indicated having understood very clearly. We have given the patient no guarantees and we have made no promises. Enough time was given to the patient to ask questions, all of which were answered to the patient's satisfaction. Ms. Borntreger has indicated that she wanted to continue with the procedure. Attestation: I, the ordering provider, attest that I have discussed with the patient the benefits, risks, side-effects, alternatives, likelihood of achieving goals, and potential problems during recovery for the procedure that I have provided informed consent. Date  Time: 07/29/2023  8:50 AM   Pre-Procedure Preparation:  Monitoring: As per clinic protocol. Respiration, ETCO2, SpO2, BP, heart rate and rhythm monitor placed and checked for adequate function Safety  Precautions: Patient was assessed for positional comfort and pressure points before starting the procedure. Time-out: I initiated and conducted the "Time-out" before starting the procedure, as per protocol. The patient was asked to participate by confirming the accuracy of the "Time Out" information. Verification of the correct person, site, and procedure were performed and confirmed by me, the nursing staff, and the patient. "Time-out" conducted as per Joint Commission's Universal Protocol (UP.01.01.01). Time: 1003 Start Time: 1003 hrs.  Description of Procedure:  Target Area: For Epidural Steroid injection(s), the target area is the  interlaminar space, initially targeting the lower border of the superior vertebral body lamina. Approach: Interlaminar approach. Area Prepped: Entire Posterior Thoracolumbar Region ChloraPrep (2% chlorhexidine gluconate and 70% isopropyl alcohol) Safety Precautions: Aspiration looking for blood return was conducted prior to all injections. At no point did we inject any substances, as a needle was being advanced. No attempts were made at seeking any paresthesias. Safe injection practices and needle disposal techniques used. Medications properly checked for expiration dates. SDV (single dose vial) medications used. Description of the Procedure: Protocol guidelines were followed. The patient was placed in position over the fluoroscopy table. The target area was identified and the area prepped in the usual manner. Skin & deeper tissues infiltrated with local anesthetic. Appropriate amount of time allowed to pass for local anesthetics to take effect. The procedure needles were then advanced to the target area. The inferior aspect of the superior lamina was contacted and the needle walked caudad, until the lamina was cleared. The epidural space was identified using "loss-of-resistance technique" with 0.9% PF-NSS (2-12mL), in a low friction 10cc LOR glass syringe. Proper  needle placement was secured. Negative aspiration confirmed. Solution injected in intermittent fashion, asking for systemic symptoms every 0.5 cc of injectate. The needles were then removed and the area cleansed, making sure to leave some of the prepping solution behind to take advantage of its long term bactericidal properties. Vitals:   07/29/23 1015 07/29/23 1022 07/29/23 1032 07/29/23 1042  BP: 127/72 121/66 121/61 121/65  Pulse: 76     Resp: 16 15 14 13   Temp:  (!) 97.1 F (36.2 C)  (!) 97.3 F (36.3 C)  TempSrc:  Temporal  Temporal  SpO2: 100% 97% 97% 96%  Weight:      Height:        Start Time: 1003 hrs. End Time: 1014 hrs.  Imaging Guidance (Spinal):          Type of Imaging Technique: Fluoroscopy Guidance (Spinal) Indication(s): Fluoroscopy guidance for needle placement to enhance accuracy in procedures requiring precise needle localization for targeted delivery of medication in or near specific anatomical locations not easily accessible without such real-time imaging assistance. Exposure Time: Please see nurses notes. Contrast: Before injecting any contrast, we confirmed that the patient did not have an allergy to iodine, shellfish, or radiological contrast. Once satisfactory needle placement was completed at the desired level, radiological contrast was injected. Contrast injected under live fluoroscopy. No contrast complications. See chart for type and volume of contrast used. Fluoroscopic Guidance: I was personally present during the use of fluoroscopy. "Tunnel Vision Technique" used to obtain the best possible view of the target area. Parallax error corrected before commencing the procedure. "Direction-depth-direction" technique used to introduce the needle under continuous pulsed fluoroscopy. Once target was reached, antero-posterior, oblique, and lateral fluoroscopic projection used confirm needle placement in all planes. Images permanently stored in EMR. Interpretation: I  personally interpreted the imaging intraoperatively. Adequate needle placement confirmed in multiple planes. Appropriate spread of contrast into desired area was observed. No evidence of afferent or efferent intravascular uptake. No intrathecal or subarachnoid spread observed. Permanent images saved into the patient's record.  Antibiotic Prophylaxis:   Anti-infectives (From admission, onward)    None      Indication(s): None identified  Post-operative Assessment:  Post-procedure Vital Signs:  Pulse/HCG Rate: 7673 Temp: (!) 97.3 F (36.3 C) Resp: 13 BP: 121/65 SpO2: 96 %  EBL: None  Complications: No  immediate post-treatment complications observed by team, or reported by patient.  Note: The patient tolerated the entire procedure well. A repeat set of vitals were taken after the procedure and the patient was kept under observation following institutional policy, for this type of procedure. Post-procedural neurological assessment was performed, showing return to baseline, prior to discharge. The patient was provided with post-procedure discharge instructions, including a section on how to identify potential problems. Should any problems arise concerning this procedure, the patient was given instructions to immediately contact us, at any time, without hesitation. In any case, we plan to contact the patient by telephone for a follow-up status report regarding this interventional procedure.  Comments:  No additional relevant information.  Plan of Care (POC)  Orders:  Orders Placed This Encounter  Procedures   Thoracic Epidural Injection    Scheduling Instructions:     Side: Left-sided     Sedation: With Sedation.     Timeframe: Today    Where will this procedure be performed?:   [facility]   [facility] C-ARM 1-60 MIN NO REPORT    Intraoperative interpretation by procedural physician at [facility].    Standing Status:   Standing    Number of Occurrences:    1    Reason for exam::   Assistance in needle guidance and placement for procedures requiring needle placement in or near specific anatomical locations not easily accessible without such assistance.   Informed Consent Details: Physician/Practitioner Attestation; Transcribe to consent form and obtain patient signature    Provider Attestation: I, Dung Salinger A. Laban Emperor, MD, (Pain Management Specialist), the physician/practitioner, attest that I have discussed with the patient the benefits, risks, side effects, alternatives, likelihood of achieving goals and potential problems during recovery for the procedure that I have provided informed consent.    Scheduling Instructions:     Nursing Order: Transcribe to consent form and obtain patient signature.     Note: Always confirm laterality of pain with Ms. Myer Haff, before procedure.    Physician/Practitioner attestation of informed consent for procedure/surgical case:   I, the physician/practitioner, attest that I have discussed with the patient the benefits, risks, side effects, alternatives, likelihood of achieving goals and potential problems during recovery for the procedure that I have provided informed consent.    Procedure:   Thoracic Epidural Steroid Injection/Block under fluoroscopic guidance    Physician/Practitioner performing the procedure:   Nanci Lakatos A. Laban Emperor MD    Indication/Reason:   Upper (Thoracic) Back Pain with or without Thoracic Radicular Pain (Rib/Flank pain) secondary to Thoracic Degenerative Disc Disease and/or Thoracic Intervertebral Disc Displacement, with or without Thoracic Radiculitis/Radiculopathy.   Provide equipment / supplies at bedside    Procedural tray: Epidural Tray (Disposable  single use) Skin infiltration needle: Regular 1.5-in, 25-G, (x1) Block needle size: Regular standard Catheter: No catheter required    Standing Status:   Standing    Number of Occurrences:   1    Specify:   Epidural Tray   Miscellanous  precautions    Standing Status:   Standing    Number of Occurrences:   1   Chronic Opioid Analgesic:   MS IR 15 mg mg, 1 tab PO q 8 hrs (45 mg/day of morphine) MME/day: 45 mg/day.   Medications ordered for procedure: Meds ordered this encounter  Medications   lidocaine (XYLOCAINE) 2 % (with pres) injection 400 mg   pentafluoroprop-tetrafluoroeth (GEBAUERS) aerosol   lactated ringers infusion   midazolam (VERSED) 5 MG/5ML injection  0.5-2 mg    Make sure Flumazenil is available in the pyxis when using this medication. If oversedation occurs, administer 0.2 mg IV over 15 sec. If after 45 sec no response, administer 0.2 mg again over 1 min; may repeat at 1 min intervals; not to exceed 4 doses (1 mg)   fentaNYL (SUBLIMAZE) injection 25-50 mcg    Make sure Narcan is available in the pyxis when using this medication. In the event of respiratory depression (RR< 8/min): Titrate NARCAN (naloxone) in increments of 0.1 to 0.2 mg IV at 2-3 minute intervals, until desired degree of reversal.   sodium chloride flush (NS) 0.9 % injection 2 mL   ropivacaine (PF) 2 mg/mL (0.2%) (NAROPIN) injection 2 mL   dexamethasone (DECADRON) injection 10 mg   Medications administered: We administered lidocaine, lactated ringers, midazolam, and fentaNYL.  See the medical record for exact dosing, route, and time of administration.  Follow-up plan:   Return in about 2 weeks (around 08/12/2023) for (Face2F), (PPE).       Interventional Therapies  Risk Factors  Considerations:   ALLERGY: Contrast Dye (Anaphylactic - Type)    Planned  Pending:   Diagnostic thoracic MRI Prednisone taper to help with the acute left intercostal/thoracic radicular pain Prescription for Valium 5 mg (2 pills) to help with the anxiety and discomfort from the thoracic MRI   Under consideration:   Diagnostic left costochondral joint injection  Diagnostic left C2 + TON NB    Completed:   Therapeutic left 8th rib intercostal NB x3  (06/26/2023) (100/100/50/0)  Palliative left intercostal NB (T5 to T8) x1 (03/03/2017)  Palliative left GONB Blk x2 (07/17/2016) (100/100/90/75) Palliative bilateral SI Blk x1 (04/02/2018)    Therapeutic  Palliative (PRN) options:   Palliative left intercostal NB (T5 to T8) #2  Palliative left GONB Blk #3  Palliative bilateral SI Blk #2    Pharmacotherapy  Nonopioids transferred 05/29/2020: Gabapentin and Voltaren gel       Recent Visits Date Type Provider Dept  07/21/23 Office Visit Delano Metz, MD [facility]  07/09/23 Office Visit Delano Metz, MD [facility]  06/26/23 Procedure visit Delano Metz, MD [facility]  Showing recent visits within past 90 days and meeting all other requirements Today's Visits Date Type Provider Dept  07/29/23 Procedure visit Delano Metz, MD [facility]  Showing today's visits and meeting all other requirements Future Appointments Date Type Provider Dept  08/14/23 Appointment Delano Metz, MD [facility]  10/20/23 Appointment Delano Metz, MD [facility]  Showing future appointments within next 90 days and meeting all other requirements  Disposition: Discharge home  Discharge (Date  Time): 07/29/2023; 1043 hrs.   Primary Care Physician: Gweneth Dimitri, MD Location: [facility] Note by: Oswaldo Done, MD (TTS technology used. I apologize for any typographical errors that were not detected and corrected.) Date: 07/29/2023; Time: 11:20 AM  Disclaimer:  Medicine is not an Visual merchandiser. The only guarantee in medicine is that nothing is guaranteed. It is important to note that the decision to proceed with this intervention was based on the information collected from the patient. The Data and conclusions were drawn from the patient's questionnaire, the interview, and the physical examination. Because the information was  provided in large part by the patient, it cannot be guaranteed that it has not been purposely or unconsciously manipulated. Every effort has been made to obtain as much relevant data as possible for this evaluation.  It is important to note that the conclusions that lead to this procedure are derived in large part from the available data. Always take into account that the treatment will also be dependent on availability of resources and existing treatment guidelines, considered by other Pain Management Practitioners as being common knowledge and practice, at the time of the intervention. For Medico-Legal purposes, it is also important to point out that variation in procedural techniques and pharmacological choices are the acceptable norm. The indications, contraindications, technique, and results of the above procedure should only be interpreted and judged by a Board-Certified Interventional Pain Specialist with extensive familiarity and expertise in the same exact procedure and technique.

## 2023-07-30 ENCOUNTER — Telehealth: Payer: Self-pay | Admitting: *Deleted

## 2023-07-30 NOTE — Telephone Encounter (Signed)
No problems post procedure. 

## 2023-07-31 DIAGNOSIS — M810 Age-related osteoporosis without current pathological fracture: Secondary | ICD-10-CM | POA: Diagnosis not present

## 2023-07-31 DIAGNOSIS — I700 Atherosclerosis of aorta: Secondary | ICD-10-CM | POA: Diagnosis not present

## 2023-07-31 DIAGNOSIS — J479 Bronchiectasis, uncomplicated: Secondary | ICD-10-CM | POA: Diagnosis not present

## 2023-07-31 DIAGNOSIS — K219 Gastro-esophageal reflux disease without esophagitis: Secondary | ICD-10-CM | POA: Diagnosis not present

## 2023-07-31 DIAGNOSIS — G8928 Other chronic postprocedural pain: Secondary | ICD-10-CM | POA: Diagnosis not present

## 2023-07-31 DIAGNOSIS — K911 Postgastric surgery syndromes: Secondary | ICD-10-CM | POA: Diagnosis not present

## 2023-07-31 DIAGNOSIS — I10 Essential (primary) hypertension: Secondary | ICD-10-CM | POA: Diagnosis not present

## 2023-07-31 DIAGNOSIS — R413 Other amnesia: Secondary | ICD-10-CM | POA: Diagnosis not present

## 2023-07-31 DIAGNOSIS — R7303 Prediabetes: Secondary | ICD-10-CM | POA: Diagnosis not present

## 2023-08-01 ENCOUNTER — Other Ambulatory Visit: Payer: Self-pay | Admitting: Family Medicine

## 2023-08-01 ENCOUNTER — Encounter: Payer: Self-pay | Admitting: Pain Medicine

## 2023-08-01 DIAGNOSIS — M810 Age-related osteoporosis without current pathological fracture: Secondary | ICD-10-CM

## 2023-08-12 DIAGNOSIS — K219 Gastro-esophageal reflux disease without esophagitis: Secondary | ICD-10-CM | POA: Diagnosis not present

## 2023-08-12 DIAGNOSIS — R1314 Dysphagia, pharyngoesophageal phase: Secondary | ICD-10-CM | POA: Diagnosis not present

## 2023-08-12 DIAGNOSIS — K140 Glossitis: Secondary | ICD-10-CM | POA: Diagnosis not present

## 2023-08-14 ENCOUNTER — Ambulatory Visit: Payer: Medicare Other | Admitting: Pain Medicine

## 2023-08-25 ENCOUNTER — Ambulatory Visit (HOSPITAL_COMMUNITY): Admit: 2023-08-25 | Payer: Medicare Other | Admitting: Orthopedic Surgery

## 2023-08-25 SURGERY — ARTHROPLASTY, KNEE, TOTAL
Anesthesia: Spinal | Site: Knee | Laterality: Left

## 2023-08-26 DIAGNOSIS — R1310 Dysphagia, unspecified: Secondary | ICD-10-CM | POA: Diagnosis not present

## 2023-08-26 DIAGNOSIS — K219 Gastro-esophageal reflux disease without esophagitis: Secondary | ICD-10-CM | POA: Diagnosis not present

## 2023-10-01 ENCOUNTER — Telehealth: Payer: Self-pay | Admitting: Pain Medicine

## 2023-10-01 NOTE — Telephone Encounter (Signed)
PT called stated that she has a letter for jury duty wants to see if Laban Emperor will write up a letter for her due to the pain that she has and due to the medication that she takes. PT states that she need to come pick up letter by 10-10-23. Please give patient a call. TY

## 2023-10-06 ENCOUNTER — Encounter: Payer: Self-pay | Admitting: Pain Medicine

## 2023-10-06 NOTE — Telephone Encounter (Signed)
 Letter created and mailed per patient request.

## 2023-10-19 NOTE — Patient Instructions (Incomplete)

## 2023-10-19 NOTE — Progress Notes (Unsigned)
 PROVIDER NOTE: Information contained herein reflects review and annotations entered in association with encounter. Interpretation of such information and data should be left to medically-trained personnel. Information provided to patient can be located elsewhere in the medical record under "Patient Instructions". Document created using STT-dictation technology, any transcriptional errors that may result from process are unintentional.    Patient: Kari Gibbs  Service Category: E/M  Provider: Oswaldo Done, MD  DOB: June 16, 1951  DOS: 10/20/2023  Referring Provider: Gweneth Dimitri, MD  MRN: [id number]  Specialty: Interventional Pain Management  PCP: Gweneth Dimitri, MD  Type: Established Patient  Setting: Ambulatory outpatient    Location: Office  Delivery: Face-to-face     HPI  [name], a 73 y.o. year old female, is here today because of her No primary diagnosis found.. [name] primary complain today is No chief complaint on file.  Pertinent problems: Ms. Lupa has Chronic pain syndrome; Chronic flank pain (1ry area of Pain) (Left); Chronic thoracic radicular pain (Left-sided); Chronic occipital neuralgia (Left); Neuropathic pain; Neurogenic pain; History of rib fracture (4th and 8th rib) (Left); Vertebral body hemangioma (T11); Metatarsalgia of foot (Right); Chronic abdominal pain (2ry area of Pain) (Bilateral) (L>R); Primary osteoarthritis; Chronic neck pain (4th area of Pain) (Bilateral) (L>R); Cervical facet syndrome (Bilateral) (L>R); Muscle spasticity; Tremors of nervous system; Muscle twitching; Cervical spondylosis; Chronic low back pain (3ry area of Pain) (Bilateral) w/o sciatica; Chronic low back pain (Bilateral) w/ sciatica (Bilateral); Chronic sacroiliac joint pain (Bilateral); Other specified dorsopathies, sacral and sacrococcygeal region; Chronic sacroiliac joint somatic dysfunction (Bilateral); Epigastric pain; Left upper quadrant pain; Generalized pain;  Headache; Rib pain (Left); Abnormal MRI, thoracic spine (07/28/2023); Fracture of rib (4th and 8th rib) (Left); 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 Pain Assessment: Severity of   is reported as a  /10. Location:    / . Onset:  . Quality:  . Timing:  . Modifying factor(s):  Marland Kitchen Vitals:  vitals were not taken for this visit.  BMI: Estimated body mass index is 29.86 kg/m as calculated from the following:   Height as of 07/29/23: 5\' 6"  (1.676 m).   Weight as of 07/29/23: 185 lb (83.9 kg). Last encounter: 07/21/2023. Last procedure: 07/29/2023.  Reason for encounter: medication management. ***  Discussed the use of AI scribe software for clinical note transcription with the patient, who gave verbal consent to proceed.  History of Present Illness         RTCB: 01/22/2024   Pharmacotherapy Assessment  Analgesic: MS IR 15 mg mg, 1 tab PO q 8 hrs (45 mg/day of morphine) MME/day: 45 mg/day.   Monitoring: [place] PMP: PDMP reviewed during this encounter.       Pharmacotherapy: No side-effects or adverse reactions reported. Compliance: No problems identified. Effectiveness: Clinically acceptable.  No notes on file  No results found for: "CBDTHCR" No results found for: "D8THCCBX" No results found for: "D9THCCBX"  UDS:  Summary  Date Value Ref Range Status  01/22/2023 Note  Final    Comment:    ==================================================================== [employer] Select 13 (MW) ==================================================================== Test                             Result       Flag       Units  Drug Present and Declared for Prescription Verification   Lorazepam  1598         EXPECTED   ng/mg creat    Source of lorazepam is a scheduled prescription medication.    Morphine                       18727        EXPECTED   ng/mg creat    Normorphine                    780          EXPECTED   ng/mg creat    Potential sources of large amounts of morphine in the absence of    codeine include administration of morphine or use of heroin.     Normorphine is an expected metabolite of morphine.    Hydromorphone                  306          EXPECTED   ng/mg creat    Hydromorphone may be present as a metabolite of morphine;    concentrations of hydromorphone rarely exceed 5% of the morphine    concentration when this is the source of hydromorphone.  ==================================================================== Test                      Result    Flag   Units      Ref Range   Creatinine              51               mg/dL      >=16 ==================================================================== Declared Medications:  The flagging and interpretation on this report are based on the  following declared medications.  Unexpected results may arise from  inaccuracies in the declared medications.   **Note: The testing scope of this panel includes these medications:   Lorazepam (Ativan)  Morphine (MSIR)   **Note: The testing scope of this panel does not include the  following reported medications:   Acetaminophen (Tylenol)  Benzonatate (Tessalon)  Biotin  Bupropion (Wellbutrin XL)  Buspirone (Buspar)  Cranberry  Cyanocobalamin  Denosumab (Prolia)  Diclofenac (Voltaren)  Esomeprazole (Nexium)  Estradiol (Estrace)  Eszopiclone  Folic Acid  Irbesartan (Avapro)  Iron  Lansoprazole (Prevacid)  Loratadine (Claritin)  Magnesium  Montelukast (Singulair)  Multivitamin  Naloxone (Narcan)  Polyethylene Glycol  Potassium  Probiotic  Supplement  Vitamin C  Vitamin D3 ==================================================================== For clinical consultation, please call [phone]. ====================================================================       ROS  Constitutional: Denies any fever or  chills Gastrointestinal: No reported hemesis, hematochezia, vomiting, or acute GI distress Musculoskeletal: Denies any acute onset joint swelling, redness, loss of ROM, or weakness Neurological: No reported episodes of acute onset apraxia, aphasia, dysarthria, agnosia, amnesia, paralysis, loss of coordination, or loss of consciousness  Medication Review  Biotin, Cranberry, Iron-Vitamin C, Magnesium, Peppermint Oil, Polyethyl Glycol-Propyl Glycol, Prenatal MV & Min w/FA-DHA, RA Probiotic Gummies, Vitamin D3, acetaminophen, benzonatate, buPROPion, busPIRone, denosumab, diazepam, diclofenac Sodium, estradiol, irbesartan, lansoprazole, loratadine, montelukast, morphine, naloxone, potassium gluconate, and vitamin B-12  History Review  Allergy: Ms. Scinto is allergic to ace inhibitors, losartan potassium, neosporin [neomycin-bacitracin zn-polymyx], and bacitracin-polymyxin b. Drug: Ms. Cowens  reports no history of drug use. Alcohol:  reports current alcohol use. Tobacco:  reports that she has never smoked. She has never used smokeless tobacco. Social: Ms. Clendenin  reports that she has never smoked. She has  never used smokeless tobacco. She reports current alcohol use. She reports that she does not use drugs. 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 Surgical: Ms. 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 Family: family history includes Alzheimer's disease in her father; Hypertension in her mother.  Laboratory Chemistry Profile   Renal Lab Results  Component Value Date   BUN 19 11/15/2021   CREATININE 0.90 11/15/2021   GFRAA >60 01/04/2016   GFRNONAA >60 11/15/2021    Hepatic Lab Results  Component Value Date   AST 29 04/04/2021   ALT 30 04/04/2021   ALBUMIN 4.6 04/04/2021   ALKPHOS 89 04/04/2021   AMYLASE 30 02/27/2010   LIPASE 22 10/12/2012    Electrolytes Lab Results  Component Value Date   NA 140 11/15/2021   K 5.2 (H) 11/15/2021   CL 105 11/15/2021   CALCIUM 9.4 11/15/2021   MG 2.1 01/04/2016    Bone Lab Results  Component Value Date   25OHVITD1 49 01/04/2016   25OHVITD2 25 01/04/2016   25OHVITD3 24 01/04/2016    Inflammation (CRP: Acute Phase) (ESR: Chronic Phase) Lab Results  Component Value Date   CRP <0.5 01/04/2016   ESRSEDRATE 17 01/04/2016         Note: Above Lab results reviewed.  Recent Imaging Review  DG PAIN CLINIC C-ARM 1-60 MIN NO REPORT Fluoro was used, but no Radiologist interpretation will be provided.  Please refer to "NOTES" tab for provider progress note. Note: Reviewed        Physical Exam  General appearance: Well nourished, well developed, and well hydrated. In no apparent acute distress Mental status: Alert, oriented x 3 (person, place, & time)       Respiratory: No evidence of acute respiratory distress Eyes: PERLA Vitals: There were no vitals taken for this visit. BMI:  Estimated body mass index is 29.86 kg/m as calculated from the following:   Height as of 07/29/23: 5\' 6"  (1.676 m).   Weight as of 07/29/23: 185 lb (83.9 kg). Ideal: Patient weight not recorded  Assessment   Diagnosis Status  1. Chronic flank pain (1ry area of Pain) (Left)   2. Chronic low back pain (3ry area of Pain) (Bilateral) w/o sciatica   3. Chronic abdominal pain (2ry area of Pain) (Bilateral) (L>R)   4. Chronic neck pain (4th area of Pain) (Bilateral) (L>R)   5. Chronic pain syndrome   6. Pharmacologic therapy   7. Chronic use of opiate for therapeutic purpose   8. Encounter for medication management   9. Encounter for chronic pain management    Controlled Controlled Controlled   Updated Problems: No problems updated.  Plan of Care  Problem-specific:  Assessment and Plan  Ms. Katrice Goel has a current medication list which includes the following long-term medication(s): bupropion, diclofenac sodium, lansoprazole, and morphine.  Pharmacotherapy (Medications Ordered): No orders of the defined types were placed in this encounter.  Orders:  No orders of the defined types were placed in this encounter.  Follow-up plan:   No follow-ups on file.      Interventional Therapies  Risk Factors  Considerations:   ALLERGY: Contrast Dye (Anaphylactic - Type)    Planned  Pending:   Diagnostic thoracic MRI Prednisone taper to help with the acute left intercostal/thoracic radicular pain Prescription for Valium 5 mg (2 pills) to help with the anxiety and discomfort from the thoracic MRI   Under consideration:   Diagnostic left costochondral joint injection  Diagnostic left C2 + TON NB    Completed:   Therapeutic left 8th rib intercostal NB x3 (06/26/2023) (100/100/50/0)  Palliative left intercostal NB (T5 to T8) x1 (03/03/2017)  Palliative left GONB Blk x2 (07/17/2016) (100/100/90/75) Palliative bilateral SI Blk x1 (04/02/2018)    Therapeutic   Palliative (PRN) options:   Palliative left intercostal NB (T5 to T8) #2  Palliative left GONB Blk #3  Palliative bilateral SI Blk #2    Pharmacotherapy  Nonopioids transferred 05/29/2020: Gabapentin and Voltaren gel      Recent Visits Date Type Provider Dept  07/29/23 Procedure visit Delano Metz, MD [facility]  07/21/23 Office Visit Delano Metz, MD [facility]  Showing recent visits within past 90 days and meeting all other requirements Future Appointments Date Type Provider Dept  10/20/23 Appointment Delano Metz, MD [facility]  Showing future appointments within next 90 days and meeting all other requirements  I discussed the assessment and treatment plan with the patient. The patient was provided an opportunity to ask questions and all were answered. The patient agreed with the plan and demonstrated an understanding of the instructions.  Patient advised to call back or seek an in-person evaluation if the symptoms or condition worsens.  Duration of encounter: *** minutes.  Total time on encounter, as per AMA guidelines included both the face-to-face and non-face-to-face time personally spent by the physician and/or other qualified health care professional(s) on the day of the encounter (includes time in activities that require the physician or other qualified health care professional and does not include time in activities normally performed by clinical staff). Physician's time may include the following activities when performed: Preparing to see the patient (e.g., pre-charting review of records, searching for previously ordered imaging, lab work, and nerve conduction tests) Review of prior analgesic pharmacotherapies. Reviewing PMP Interpreting ordered tests (e.g., lab work, imaging, nerve conduction tests) Performing post-procedure evaluations, including interpretation of diagnostic procedures Obtaining and/or reviewing  separately obtained history Performing a medically appropriate examination and/or evaluation Counseling and educating the patient/family/caregiver Ordering medications, tests, or procedures Referring and communicating with other health care professionals (when not separately reported) Documenting clinical information in the electronic or other health record Independently interpreting results (not separately reported) and communicating results to the patient/ family/caregiver Care coordination (not separately reported)  Note by: Oswaldo Done, MD Date: 10/20/2023; Time: 2:25 PM

## 2023-10-20 ENCOUNTER — Ambulatory Visit: Payer: Medicare Other | Attending: Pain Medicine | Admitting: Pain Medicine

## 2023-10-20 ENCOUNTER — Encounter: Payer: Self-pay | Admitting: Pain Medicine

## 2023-10-20 DIAGNOSIS — R109 Unspecified abdominal pain: Secondary | ICD-10-CM | POA: Diagnosis not present

## 2023-10-20 DIAGNOSIS — Z79899 Other long term (current) drug therapy: Secondary | ICD-10-CM | POA: Insufficient documentation

## 2023-10-20 DIAGNOSIS — M542 Cervicalgia: Secondary | ICD-10-CM | POA: Diagnosis not present

## 2023-10-20 DIAGNOSIS — Z79891 Long term (current) use of opiate analgesic: Secondary | ICD-10-CM | POA: Insufficient documentation

## 2023-10-20 DIAGNOSIS — G894 Chronic pain syndrome: Secondary | ICD-10-CM | POA: Diagnosis not present

## 2023-10-20 DIAGNOSIS — R1011 Right upper quadrant pain: Secondary | ICD-10-CM | POA: Insufficient documentation

## 2023-10-20 DIAGNOSIS — G8929 Other chronic pain: Secondary | ICD-10-CM | POA: Insufficient documentation

## 2023-10-20 DIAGNOSIS — R1012 Left upper quadrant pain: Secondary | ICD-10-CM | POA: Diagnosis not present

## 2023-10-20 DIAGNOSIS — M545 Low back pain, unspecified: Secondary | ICD-10-CM | POA: Insufficient documentation

## 2023-10-20 DIAGNOSIS — N200 Calculus of kidney: Secondary | ICD-10-CM | POA: Diagnosis not present

## 2023-10-20 DIAGNOSIS — N3020 Other chronic cystitis without hematuria: Secondary | ICD-10-CM | POA: Diagnosis not present

## 2023-10-20 MED ORDER — MORPHINE SULFATE 15 MG PO TABS: 15.0000 mg | ORAL_TABLET | Freq: Three times a day (TID) | ORAL | 0 refills | Status: DC | PRN

## 2023-10-20 NOTE — Progress Notes (Signed)
 Nursing Pain Medication Assessment:  Safety precautions to be maintained throughout the outpatient stay will include: orient to surroundings, keep bed in low position, maintain call bell within reach at all times, provide assistance with transfer out of bed and ambulation.   Medication Inspection Compliance: Pill count conducted under aseptic conditions, in front of the patient. Neither the pills nor the bottle was removed from the patient's sight at any time. Once count was completed pills were immediately returned to the patient in their original bottle.  Medication: Morphine IR Pill/Patch Count:  12 of 90 pills remain Pill/Patch Appearance: Markings consistent with prescribed medication Bottle Appearance: Standard pharmacy container. Clearly labeled. Filled Date: 02 / 12 / 2025 Last Medication intake:  Yesterday

## 2023-12-11 DIAGNOSIS — N3020 Other chronic cystitis without hematuria: Secondary | ICD-10-CM | POA: Diagnosis not present

## 2023-12-22 DIAGNOSIS — N3020 Other chronic cystitis without hematuria: Secondary | ICD-10-CM | POA: Diagnosis not present

## 2023-12-26 DIAGNOSIS — R1310 Dysphagia, unspecified: Secondary | ICD-10-CM | POA: Diagnosis not present

## 2023-12-26 DIAGNOSIS — R1110 Vomiting, unspecified: Secondary | ICD-10-CM | POA: Diagnosis not present

## 2024-01-14 ENCOUNTER — Encounter: Admitting: Pain Medicine

## 2024-01-14 ENCOUNTER — Ambulatory Visit: Attending: Nurse Practitioner | Admitting: Nurse Practitioner

## 2024-01-14 ENCOUNTER — Encounter: Payer: Self-pay | Admitting: Nurse Practitioner

## 2024-01-14 DIAGNOSIS — Z79891 Long term (current) use of opiate analgesic: Secondary | ICD-10-CM

## 2024-01-14 DIAGNOSIS — Z79899 Other long term (current) drug therapy: Secondary | ICD-10-CM

## 2024-01-14 DIAGNOSIS — R1012 Left upper quadrant pain: Secondary | ICD-10-CM | POA: Insufficient documentation

## 2024-01-14 DIAGNOSIS — G8929 Other chronic pain: Secondary | ICD-10-CM

## 2024-01-14 DIAGNOSIS — R1011 Right upper quadrant pain: Secondary | ICD-10-CM | POA: Diagnosis not present

## 2024-01-14 DIAGNOSIS — M542 Cervicalgia: Secondary | ICD-10-CM | POA: Insufficient documentation

## 2024-01-14 DIAGNOSIS — G894 Chronic pain syndrome: Secondary | ICD-10-CM

## 2024-01-14 DIAGNOSIS — R109 Unspecified abdominal pain: Secondary | ICD-10-CM | POA: Diagnosis not present

## 2024-01-14 DIAGNOSIS — M545 Low back pain, unspecified: Secondary | ICD-10-CM | POA: Diagnosis not present

## 2024-01-14 MED ORDER — MORPHINE SULFATE 15 MG PO TABS: 15.0000 mg | ORAL_TABLET | Freq: Three times a day (TID) | ORAL | 0 refills | Status: DC | PRN

## 2024-01-14 MED ORDER — MORPHINE SULFATE 15 MG PO TABS
15.0000 mg | ORAL_TABLET | Freq: Three times a day (TID) | ORAL | 0 refills | Status: DC | PRN
Start: 2024-01-22 — End: 2024-01-22

## 2024-01-14 MED ORDER — MORPHINE SULFATE 15 MG PO TABS
15.0000 mg | ORAL_TABLET | Freq: Three times a day (TID) | ORAL | 0 refills | Status: DC | PRN
Start: 2024-02-21 — End: 2024-03-02

## 2024-01-14 NOTE — Progress Notes (Signed)
 PROVIDER NOTE: Interpretation of information contained herein should be left to medically-trained personnel. Specific patient instructions are provided elsewhere under "Patient Instructions" section of medical record. This document was created in part using AI and STT-dictation technology, any transcriptional errors that may result from this process are unintentional.  Patient: Kari Gibbs  Service: E/M   PCP: Helyn Lobstein, MD  DOB: 09-Feb-1951  DOS: 01/14/2024  Provider: Cherylin Corrigan, NP  MRN: [id number]  Delivery: Face-to-face  Specialty: Interventional Pain Management  Type: Established Patient  Setting: Ambulatory outpatient facility  Specialty designation: 09  Referring Prov.: Helyn Lobstein, MD  Location: Outpatient office facility       History of present illness (HPI) Kari Gibbs, a 73 y.o. year old female, is here today because of her No primary diagnosis found.. Kari Gibbs primary complain today is Pain (Left abdomen around left flank to left mid back)  Pertinent problems: Kari Gibbs has Chronic pain syndrome; Chronic flank pain (1ry area of pain) (left); Chronic thoracic radicular pain (Left-sided); Chronic occipital neuralgia (Left); Neuropathic pain; Neurogenic pain; History of rib fracture (4th and 8th rib) (Left); Chronic use of opiate; Trigger point with back pain (Left); Rib pain (Left); and Opioid induced constipation (OIC) on their pertinent problem list.   Pain Assessment: Severity of Chronic pain is reported as a 6 /10. Location: Abdomen Left/around left flank to left mid back. Onset: More than a month ago. Quality: Throbbing, Jabbing. Timing: Constant. Modifying factor(s): meds, topicals, heat. Vitals:  height is 5' 5.5" (1.664 m) and weight is 175 lb (79.4 kg). Her temperature is 97.2 F (36.2 C) (abnormal). Her blood pressure is 139/75 and her pulse is 92. Her respiration is 16 and oxygen saturation is 100%.  BMI: Estimated body mass index is 28.68 kg/m as  calculated from the following:   Height as of this encounter: 5' 5.5" (1.664 m).   Weight as of this encounter: 175 lb (79.4 kg).  Last encounter: 10/20/2023 Last procedure: 07/29/2023  Reason for encounter: medication management.  The patient indicates doing well with current medication regimen.  No adverse reaction or side effects reported to medication.  The patient complains of left flank pain to left mid back pain and associated left abdominal pain.  She also reports a history of esophageal issues.  She is currently awaiting a GI appointment at [facility] for further evaluation and management.   Pharmacotherapy Assessment  Analgesic: Morphine  (MS IR) 15 mg tablet every 8 hours as needed for pain. MME=45 Monitoring: [place] PMP: PDMP reviewed during this encounter.       Pharmacotherapy: No side-effects or adverse reactions reported. Compliance: No problems identified. Effectiveness: Clinically acceptable.  Lennis Rabon, RN  01/14/2024 10:42 AM  Sign when Signing Visit Nursing Pain Medication Assessment:  Safety precautions to be maintained throughout the outpatient stay will include: orient to surroundings, keep bed in low position, maintain call bell within reach at all times, provide assistance with transfer out of bed and ambulation.  Medication Inspection Compliance: Pill count conducted under aseptic conditions, in front of the patient. Neither the pills nor the bottle was removed from the patient's sight at any time. Once count was completed pills were immediately returned to the patient in their original bottle.  Medication: Morphine  IR Pill/Patch Count: 23 of 90 pills/patches remain Pill/Patch Appearance: Markings consistent with prescribed medication Bottle Appearance: Standard pharmacy container. Clearly labeled. Filled Date: 5 / 62 / 2025 Last Medication intake:  Today    No results found  for: "CBDTHCR" No results found for: "D8THCCBX" No results found for:  "D9THCCBX"  UDS:  Summary  Date Value Ref Range Status  01/22/2023 Note  Final    Comment:    ==================================================================== ToxASSURE Select 13 (MW) ==================================================================== Test                             Result       Flag       Units  Drug Present and Declared for Prescription Verification   Lorazepam                       1598         EXPECTED   ng/mg creat    Source of lorazepam  is a scheduled prescription medication.    Morphine                        18727        EXPECTED   ng/mg creat   Normorphine                    780          EXPECTED   ng/mg creat    Potential sources of large amounts of morphine  in the absence of    codeine  include administration of morphine  or use of heroin.     Normorphine is an expected metabolite of morphine .    Hydromorphone                   306          EXPECTED   ng/mg creat    Hydromorphone  may be present as a metabolite of morphine ;    concentrations of hydromorphone  rarely exceed 5% of the morphine     concentration when this is the source of hydromorphone .  ==================================================================== Test                      Result    Flag   Units      Ref Range   Creatinine              51               mg/dL      >=16 ==================================================================== Declared Medications:  The flagging and interpretation on this report are based on the  following declared medications.  Unexpected results may arise from  inaccuracies in the declared medications.   **Note: The testing scope of this panel includes these medications:   Lorazepam  (Ativan )  Morphine  (MSIR)   **Note: The testing scope of this panel does not include the  following reported medications:   Acetaminophen  (Tylenol )  Benzonatate  (Tessalon )  Biotin  Bupropion (Wellbutrin XL)  Buspirone (Buspar)  Cranberry  Cyanocobalamin    Denosumab  (Prolia )  Diclofenac  (Voltaren )  Esomeprazole (Nexium)  Estradiol (Estrace)  Eszopiclone  Folic Acid  Irbesartan (Avapro)  Iron  Lansoprazole (Prevacid)  Loratadine (Claritin)  Magnesium  Montelukast  (Singulair )  Multivitamin  Naloxone  (Narcan )  Polyethylene Glycol  Potassium  Probiotic  Supplement  Vitamin C  Vitamin D3 ==================================================================== For clinical consultation, please call [phone]. ====================================================================      ROS  Constitutional: Denies any fever or chills Gastrointestinal: No reported hemesis, hematochezia, vomiting, or acute GI distress Musculoskeletal: left flank to mid back pain, left abdominal pain Neurological: No reported episodes of acute onset apraxia, aphasia, dysarthria, agnosia, amnesia, paralysis, loss  of coordination, or loss of consciousness  Medication Review  Biotin, Cranberry, Iron-Vitamin C, Magnesium, Peppermint Oil, Polyethyl Glycol-Propyl Glycol, Prenatal MV & Min w/FA-DHA, RA Probiotic Gummies, Vitamin D3, acetaminophen , benzonatate , buPROPion, busPIRone, denosumab , diclofenac  Sodium, estradiol, irbesartan, lansoprazole, loratadine, montelukast , morphine , naloxone , potassium gluconate, and vitamin B-12  History Review  Allergy: Kari Gibbs is allergic to ace inhibitors, losartan potassium, neosporin [neomycin-bacitracin zn-polymyx], and bacitracin-polymyxin b. Drug: Kari Gibbs  reports no history of drug use. Alcohol:  reports current alcohol use. Tobacco:  reports that she has never smoked. She has never used smokeless tobacco. Social: Kari Gibbs  reports that she has never smoked. She has never used smokeless tobacco. She reports current alcohol use. She reports that she does not use drugs. 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 Family: family history includes Alzheimer's disease in her father; Hypertension in her mother.  Laboratory Chemistry Profile   Renal Lab Results  Component Value Date   BUN 19 11/15/2021   CREATININE 0.90 11/15/2021   GFRAA >60 01/04/2016   GFRNONAA >60 11/15/2021    Hepatic Lab Results  Component Value Date   AST 29 04/04/2021   ALT 30 04/04/2021   ALBUMIN 4.6 04/04/2021   ALKPHOS 89 04/04/2021   AMYLASE 30 02/27/2010   LIPASE 22 10/12/2012    Electrolytes Lab Results  Component Value Date   NA 140 11/15/2021   K 5.2 (H) 11/15/2021   CL 105 11/15/2021   CALCIUM 9.4 11/15/2021   MG 2.1 01/04/2016    Bone Lab Results  Component Value Date   25OHVITD1 49 01/04/2016   25OHVITD2 25 01/04/2016   25OHVITD3 24 01/04/2016    Inflammation (CRP: Acute Phase) (ESR: Chronic Phase) Lab Results  Component Value Date   CRP <0.5 01/04/2016   ESRSEDRATE 17 01/04/2016          Note: Above Lab results reviewed.  Recent Imaging Review  DG PAIN CLINIC C-ARM 1-60 MIN NO REPORT Fluoro was used, but no Radiologist interpretation will be provided.  Please refer to "NOTES" tab for provider progress note. Note: Reviewed         Physical Exam  Gibbs appearance: Well nourished, well developed, and well hydrated. In no apparent acute distress Mental status: Alert, oriented x 3 (person, place, & time)       Respiratory: No evidence of acute respiratory distress Eyes: PERLA Vitals: BP 139/75   Pulse 92   Temp (!) 97.2 F (36.2 C)   Resp 16   Ht 5' 5.5" (1.664 m)   Wt 175 lb (79.4 kg)   SpO2 100%   BMI 28.68 kg/m  BMI: Estimated body mass index is 28.68 kg/m as calculated from the  following:   Height as of this encounter: 5' 5.5" (1.664 m).   Weight as of this encounter: 175 lb (79.4 kg). Ideal: Ideal body weight: 58.2 kg (128 lb 3.2 oz) Adjusted ideal body weight: 66.6 kg (146 lb 14.7 oz)  Assessment   Diagnosis Status  1. Chronic flank pain (1ry area of Pain) (Left)   2. Chronic low back pain (3ry area of Pain) (Bilateral) w/o sciatica   3. Chronic abdominal pain (2ry area of Pain) (Bilateral) (L>R)   4. Chronic neck pain (4th area of Pain) (Bilateral) (L>R)   5. Chronic pain syndrome   6. Pharmacologic therapy   7. Chronic use of opiate for therapeutic purpose   8. Encounter for medication management   9. Encounter for chronic pain management    Controlled Controlled Controlled   Updated Problems: Problem  Encounter for Medication Management    Plan of Care  Problem-specific:  Assessment and Plan We will continue on current medication regimen.  Prescribing drug monitoring (PDMP) reviewed; findings consistent with use of prescribed medication and no evidence of narcotic misuse or abuse.Routine UDS ordered today.  Schedule follow-up in 90 days for medication management.  Kari Gibbs has a current medication list which includes the  following long-term medication(s): bupropion, diclofenac  sodium, lansoprazole, [START ON 01/22/2024] morphine , [START ON 02/21/2024] morphine , and [START ON 03/22/2024] morphine .  Pharmacotherapy (Medications Ordered): Meds ordered this encounter  Medications   morphine  (MSIR) 15 MG tablet    Sig: Take 1 tablet (15 mg total) by mouth every 8 (eight) hours as needed. Must last 30 days.    Dispense:  90 tablet    Refill:  0    DO NOT: delete (not duplicate); no partial-fill (will deny script to complete), no refill request (F/U required). DISPENSE: 1 day early if closed on fill date. WARN: No CNS-depressants within 8 hrs of med.   morphine  (MSIR) 15 MG tablet    Sig: Take 1 tablet (15 mg total) by mouth every 8 (eight) hours as needed. Must last 30 days.    Dispense:  90 tablet    Refill:  0    DO NOT: delete (not duplicate); no partial-fill (will deny script to complete), no refill request (F/U required). DISPENSE: 1 day early if closed on fill date. WARN: No CNS-depressants within 8 hrs of med.   morphine  (MSIR) 15 MG tablet    Sig: Take 1 tablet (15 mg total) by mouth every 8 (eight) hours as needed. Must last 30 days.    Dispense:  90 tablet    Refill:  0    DO NOT: delete (not duplicate); no partial-fill (will deny script to complete), no refill request (F/U required). DISPENSE: 1 day early if closed on fill date. WARN: No CNS-depressants within 8 hrs of med.   Orders:  Orders Placed This Encounter  Procedures   ToxASSURE Select 13 (MW), Urine    Volume: 30 ml(s). Minimum 3 ml of urine is needed. Document temperature of fresh sample. Indications: Long term (current) use of opiate analgesic (K44.010)    Release to patient:   Immediate        Return in about 3 months (around 04/15/2024) for (F2F), (MM), Marthe Slain NP.    Recent Visits Date Type Provider Dept  10/20/23 Office Visit Renaldo Caroli, MD [facility]  Showing recent visits within past 90 days and meeting  all other requirements Today's Visits Date Type Provider Dept  01/14/24 Office Visit Dalphine Cowie K,  NP [facility]  Showing today's visits and meeting all other requirements Future Appointments No visits were found meeting these conditions. Showing future appointments within next 90 days and meeting all other requirements  I discussed the assessment and treatment plan with the patient. The patient was provided an opportunity to ask questions and all were answered. The patient agreed with the plan and demonstrated an understanding of the instructions.  Patient advised to call back or seek an in-person evaluation if the symptoms or condition worsens.  Duration of encounter: 30 minutes.  Total time on encounter, as per AMA guidelines included both the face-to-face and non-face-to-face time personally spent by the physician and/or other qualified health care professional(s) on the day of the encounter (includes time in activities that require the physician or other qualified health care professional and does not include time in activities normally performed by clinical staff). Physician's time may include the following activities when performed: Preparing to see the patient (e.g., pre-charting review of records, searching for previously ordered imaging, lab work, and nerve conduction tests) Review of prior analgesic pharmacotherapies. Reviewing PMP Interpreting ordered tests (e.g., lab work, imaging, nerve conduction tests) Performing post-procedure evaluations, including interpretation of diagnostic procedures Obtaining and/or reviewing separately obtained history Performing a medically appropriate examination and/or evaluation Counseling and educating the patient/family/caregiver Ordering medications, tests, or procedures Referring and communicating with other health care professionals (when not separately reported) Documenting clinical information in the electronic or other health  record Independently interpreting results (not separately reported) and communicating results to the patient/ family/caregiver Care coordination (not separately reported)  Note by: Sela Falk K Latif Nazareno, NP (TTS and AI technology used. I apologize for any typographical errors that were not detected and corrected.) Date: 01/14/2024; Time: 10:59 AM

## 2024-01-14 NOTE — Progress Notes (Signed)
 Nursing Pain Medication Assessment:  Safety precautions to be maintained throughout the outpatient stay will include: orient to surroundings, keep bed in low position, maintain call bell within reach at all times, provide assistance with transfer out of bed and ambulation.  Medication Inspection Compliance: Pill count conducted under aseptic conditions, in front of the patient. Neither the pills nor the bottle was removed from the patient's sight at any time. Once count was completed pills were immediately returned to the patient in their original bottle.  Medication: Morphine  IR Pill/Patch Count: 23 of 90 pills/patches remain Pill/Patch Appearance: Markings consistent with prescribed medication Bottle Appearance: Standard pharmacy container. Clearly labeled. Filled Date: 5 / 69 / 2025 Last Medication intake:  Today

## 2024-01-19 LAB — TOXASSURE SELECT 13 (MW), URINE

## 2024-01-22 ENCOUNTER — Telehealth: Payer: Self-pay | Admitting: Pain Medicine

## 2024-01-22 ENCOUNTER — Other Ambulatory Visit: Payer: Self-pay

## 2024-01-22 DIAGNOSIS — Z79899 Other long term (current) drug therapy: Secondary | ICD-10-CM

## 2024-01-22 DIAGNOSIS — G894 Chronic pain syndrome: Secondary | ICD-10-CM

## 2024-01-22 DIAGNOSIS — Z79891 Long term (current) use of opiate analgesic: Secondary | ICD-10-CM

## 2024-01-22 DIAGNOSIS — G8929 Other chronic pain: Secondary | ICD-10-CM

## 2024-01-22 MED ORDER — MORPHINE SULFATE 15 MG PO TABS: 15.0000 mg | ORAL_TABLET | Freq: Three times a day (TID) | ORAL | 0 refills | Status: DC | PRN

## 2024-01-22 NOTE — Telephone Encounter (Signed)
 Patient states her pharmacy does not have Morhpine Sulfate. She found a diff pharmacy  [facility] [address] [place] Zada Herrlich  [phone]   Please notify patient when she can pick up meds

## 2024-01-28 DIAGNOSIS — L2089 Other atopic dermatitis: Secondary | ICD-10-CM | POA: Diagnosis not present

## 2024-01-28 DIAGNOSIS — L718 Other rosacea: Secondary | ICD-10-CM | POA: Diagnosis not present

## 2024-01-28 DIAGNOSIS — L680 Hirsutism: Secondary | ICD-10-CM | POA: Diagnosis not present

## 2024-02-02 DIAGNOSIS — M810 Age-related osteoporosis without current pathological fracture: Secondary | ICD-10-CM | POA: Diagnosis not present

## 2024-02-03 DIAGNOSIS — I700 Atherosclerosis of aorta: Secondary | ICD-10-CM | POA: Diagnosis not present

## 2024-02-03 DIAGNOSIS — N3020 Other chronic cystitis without hematuria: Secondary | ICD-10-CM | POA: Diagnosis not present

## 2024-02-03 DIAGNOSIS — J479 Bronchiectasis, uncomplicated: Secondary | ICD-10-CM | POA: Diagnosis not present

## 2024-02-03 DIAGNOSIS — R1310 Dysphagia, unspecified: Secondary | ICD-10-CM | POA: Diagnosis not present

## 2024-02-03 DIAGNOSIS — G894 Chronic pain syndrome: Secondary | ICD-10-CM | POA: Diagnosis not present

## 2024-02-03 DIAGNOSIS — K911 Postgastric surgery syndromes: Secondary | ICD-10-CM | POA: Diagnosis not present

## 2024-02-03 DIAGNOSIS — R7303 Prediabetes: Secondary | ICD-10-CM | POA: Diagnosis not present

## 2024-02-03 DIAGNOSIS — Z79899 Other long term (current) drug therapy: Secondary | ICD-10-CM | POA: Diagnosis not present

## 2024-02-03 DIAGNOSIS — J3089 Other allergic rhinitis: Secondary | ICD-10-CM | POA: Diagnosis not present

## 2024-02-03 DIAGNOSIS — I10 Essential (primary) hypertension: Secondary | ICD-10-CM | POA: Diagnosis not present

## 2024-02-19 DIAGNOSIS — R1314 Dysphagia, pharyngoesophageal phase: Secondary | ICD-10-CM | POA: Diagnosis not present

## 2024-02-19 DIAGNOSIS — K2289 Other specified disease of esophagus: Secondary | ICD-10-CM | POA: Diagnosis not present

## 2024-02-19 DIAGNOSIS — K219 Gastro-esophageal reflux disease without esophagitis: Secondary | ICD-10-CM | POA: Diagnosis not present

## 2024-02-23 ENCOUNTER — Ambulatory Visit: Attending: Nurse Practitioner | Admitting: Nurse Practitioner

## 2024-02-23 ENCOUNTER — Telehealth: Payer: Self-pay | Admitting: *Deleted

## 2024-02-23 ENCOUNTER — Telehealth: Payer: Self-pay | Admitting: Nurse Practitioner

## 2024-02-23 DIAGNOSIS — M542 Cervicalgia: Secondary | ICD-10-CM

## 2024-02-23 DIAGNOSIS — M545 Low back pain, unspecified: Secondary | ICD-10-CM | POA: Diagnosis not present

## 2024-02-23 DIAGNOSIS — R110 Nausea: Secondary | ICD-10-CM | POA: Insufficient documentation

## 2024-02-23 DIAGNOSIS — R1011 Right upper quadrant pain: Secondary | ICD-10-CM | POA: Diagnosis not present

## 2024-02-23 DIAGNOSIS — R109 Unspecified abdominal pain: Secondary | ICD-10-CM

## 2024-02-23 DIAGNOSIS — G894 Chronic pain syndrome: Secondary | ICD-10-CM | POA: Diagnosis not present

## 2024-02-23 DIAGNOSIS — R1012 Left upper quadrant pain: Secondary | ICD-10-CM

## 2024-02-23 DIAGNOSIS — Z79899 Other long term (current) drug therapy: Secondary | ICD-10-CM

## 2024-02-23 DIAGNOSIS — G8929 Other chronic pain: Secondary | ICD-10-CM

## 2024-02-23 MED ORDER — OXYCODONE-ACETAMINOPHEN 10-325 MG PO TABS: 1.0000 | ORAL_TABLET | Freq: Three times a day (TID) | ORAL | 0 refills | Status: DC | PRN

## 2024-02-23 MED ORDER — ONDANSETRON 4 MG PO TBDP: 4.0000 mg | ORAL_TABLET | Freq: Three times a day (TID) | ORAL | 0 refills | Status: DC | PRN

## 2024-02-23 NOTE — Progress Notes (Signed)
 PROVIDER NOTE: Interpretation of information contained herein should be left to medically-trained personnel. Specific patient instructions are provided elsewhere under Patient Instructions section of medical record. This document was created in part using AI and STT-dictation technology, any transcriptional errors that may result from this process are unintentional.  Patient: Kari Gibbs  Service: E/M   PCP: Aisha Harvey, MD  DOB: 10-05-1950  DOS: 02/23/2024  Provider: Emmy MARLA Blanch, NP  MRN: [id number]  Delivery: Virtual Visit  Specialty: Interventional Pain Management  Type: Established Patient  Setting: Ambulatory outpatient facility  Specialty designation: 09  Referring Prov.: Aisha Harvey, MD  Location: Remote location       Virtual Encounter - Pain Management PROVIDER NOTE: Information contained herein reflects review and annotations entered in association with encounter. Interpretation of such information and data should be left to medically-trained personnel. Information provided to patient can be located elsewhere in the medical record under Patient Instructions. Document created using STT-dictation technology, any transcriptional errors that may result from process are unintentional.    Contact & Pharmacy Preferred: [phone] Home: [phone] (home) Mobile: [phone] (mobile) E-mail: [email or web address]  [id number] - [place], [place] - [address] AT CORNER OF [facility] [address] [place] [address] Phone: [phone] Fax: [phone]  [facility] Delivery) - [place], [place] - [address] [facility] [address] [place] [address] [place] [address] Phone: [phone] Fax: [phone]  [facility] Delivery - [place], [place] - [address] [place] [address] Phone: [phone] Fax: [phone]  [id number] - [place], [place] - [address] [place] [address] Phone: [phone] Fax: [phone]   Pre-screening  [name] offered in-person vs virtual encounter. She indicated preferring virtual for this encounter.   Reason COVID-19*  Social distancing based on CDC and AMA recommendations.   I contacted Shadonna Benedick on 02/23/2024 via telephone.      I clearly identified myself as Emmy MARLA Blanch, NP. I verified that I was speaking with the correct person using two identifiers (Name: Kari Gibbs, and date of birth: 04-Nov-1950).  Consent I sought verbal advanced consent from Kari Gibbs for virtual visit interactions. I informed [name] of possible security and privacy concerns, risks, and limitations associated with providing not-in-person medical evaluation and management services. I also informed Ms. Howze of the availability of in-person appointments. Finally, I informed her that there would be a charge for the virtual visit and that she could be  personally, fully or partially, financially responsible for it. Ms. Shands expressed understanding and agreed to proceed.   Historic Elements   Ms. Ina Gibbs is a 73 y.o. year old, female patient evaluated today after our last contact on 02/23/2024. 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 Kari Gibbs has a current medication list which includes the following prescription(s): ondansetron , oxycodone -acetaminophen , acetaminophen , benzonatate , biotin, bupropion, buspirone, vitamin d3, cranberry, denosumab , diclofenac  sodium, estradiol, irbesartan, vitron-c, lansoprazole, loratadine, magnesium, montelukast , morphine , [START ON 03/22/2024] morphine , morphine , peppermint oil, systane ultra, potassium gluconate, prenatal mv & min w/fa-dha, ra probiotic gummies, and vitamin b-12. She  reports that she has never smoked. She has never used smokeless tobacco. She reports current alcohol use. She reports that she does not use drugs. Ms. Lauritsen is allergic to ace inhibitors, losartan potassium, neosporin [neomycin-bacitracin zn-polymyx], and bacitracin-polymyxin b.  BMI: Estimated body mass index is 28.68 kg/m as calculated from the following:   Height as of 01/14/24: 5' 5.5 (1.664 m).   Weight as of 01/14/24: 175 lb (79.4 kg). Last encounter: 01/14/2024. Last procedure: Visit date not found.  HPI  Today, she is being contacted for medication management. The patient indicates doing well with current medication regimen. No adverse reaction or side effects reported to medication; the patient is currently unable to obtain morphine  due to her pharmacy backorder or national shortage.  We discussed alternative medication options with an equivalent morphine  milligram (MME) to morphine  sulfate 15 Mg.  After reviewing the options, we agreed to start  Oxycodone -acetaminophen  (Percocet) 10-325 mg every 8 hours as needed for pain control for up to 7 days only.  This will be a temporary regimen for up to 7 days.  The patient complains of nausea without vomiting.  We discussed alternative regimen at the next face-to-face visit.   Pharmacotherapy Assessment  Oxycodone -acetaminophen  (Percocet) 10-325 mg tablet every 8 hours as needed for pain for up to 7 days Zofran  4 mg disintegrating tablet every 8 hours as needed for nausea  Monitoring: [place] PMP: PDMP reviewed during this encounter.       Pharmacotherapy: No side-effects or adverse reactions reported. Compliance: No problems identified. Effectiveness: Clinically acceptable. Plan: Refer to POC.  UDS:  Summary  Date Value Ref Range Status  01/14/2024 FINAL  Final    Comment:    ==================================================================== [employer] Select 13 (MW) ==================================================================== Test                             Result       Flag       Units  Drug Present and Declared for Prescription Verification   Morphine                        >6536        EXPECTED   ng/mg creat   Normorphine                    371          EXPECTED   ng/mg creat    Potential sources of large amounts of morphine  in the absence of    codeine  include administration of morphine  or use of heroin.     Normorphine is an expected metabolite of morphine .    Hydromorphone                   139          EXPECTED   ng/mg creat    Hydromorphone  may be present as a metabolite of morphine ;    concentrations of hydromorphone  rarely exceed 5%  of the morphine     concentration when this is the source of hydromorphone .  ==================================================================== Test                      Result    Flag   Units      Ref Range   Creatinine              153              mg/dL       >=79 ==================================================================== Declared Medications:  The flagging and interpretation on this report are based on the  following declared medications.  Unexpected results may arise from  inaccuracies in the declared medications.   **Note: The testing scope of this panel includes these medications:   Morphine  (MSIR)   **Note: The testing scope of this panel does not include the  following reported medications:   Acetaminophen  (Tylenol )  Benzonatate  (Tessalon )  Biotin  Bupropion (Wellbutrin XL)  Buspirone (Buspar)  Cranberry  Denosumab  (Prolia )  Diclofenac  (Voltaren )  Estradiol (Estrace)  Eye Drops  Folic Acid  Irbesartan (Avapro)  Iron  Lansoprazole (Prevacid)  Loratadine (Claritin)  Magnesium  Montelukast  (Singulair )  Multivitamin  Naloxone  (Narcan )  Potassium  Probiotic  Supplement  Vitamin B12  Vitamin C  Vitamin D3 ==================================================================== For clinical consultation, please call [phone]. ====================================================================    No results found for: MABLE OYSTER, [id number]  Laboratory Chemistry Profile   Renal Lab Results  Component Value Date   BUN 19 11/15/2021   CREATININE 0.90 11/15/2021   GFRAA >60 01/04/2016   GFRNONAA >60 11/15/2021    Hepatic Lab Results  Component Value Date   AST 29 04/04/2021   ALT 30 04/04/2021   ALBUMIN 4.6 04/04/2021   ALKPHOS 89 04/04/2021   AMYLASE 30 02/27/2010   LIPASE 22 10/12/2012    Electrolytes Lab Results  Component Value Date   NA 140 11/15/2021   K 5.2 (H) 11/15/2021   CL 105 11/15/2021   CALCIUM 9.4 11/15/2021   MG 2.1 01/04/2016    Bone Lab Results  Component Value Date   25OHVITD1 49 01/04/2016   25OHVITD2 25 01/04/2016   25OHVITD3 24 01/04/2016    Inflammation (CRP: Acute Phase) (ESR: Chronic Phase) Lab Results  Component Value Date   CRP <0.5 01/04/2016    ESRSEDRATE 17 01/04/2016         Note: Above Lab results reviewed.  Imaging  DG PAIN CLINIC C-ARM 1-60 MIN NO REPORT Fluoro was used, but no Radiologist interpretation will be provided.  Please refer to NOTES tab for provider progress note.  Assessment  The primary encounter diagnosis was Chronic pain syndrome. Diagnoses of Chronic flank pain (1ry area of Pain) (Left), Chronic low back pain (3ry area of Pain) (Bilateral) w/o sciatica, Encounter for medication management, Chronic abdominal pain (2ry area of Pain) (Bilateral) (L>R), Chronic neck pain (4th area of Pain) (Bilateral) (L>R), and Nausea without vomiting were also pertinent to this visit.  Plan of Care  Problem-specific:  Plan: Started oxycodone -acetaminophen  (Percocet) 10-325 mg tablet every 8 hours as needed for up to 7 days for pain. Started Zofran  4 mg disintegrating tablet every 8 hours as needed for nausea.  Prescribing drug monitoring (PDMP) reviewed; findings consistent with the use of morphine  and no evidence of other narcotic misuse or abuse.  Schedule follow-up in 7 days for medication management evaluation.  No other new problems or issues reported to this visit.  Ms. Sadonna Kotara has a  current medication list which includes the following long-term medication(s): bupropion, diclofenac  sodium, lansoprazole, morphine , [START ON 03/22/2024] morphine , and morphine .  Pharmacotherapy (Medications Ordered): Meds ordered this encounter  Medications   oxyCODONE -acetaminophen  (PERCOCET) 10-325 MG tablet    Sig: Take 1 tablet by mouth every 8 (eight) hours as needed for up to 7 days for pain.    Dispense:  21 tablet    Refill:  0   ondansetron  (ZOFRAN -ODT) 4 MG disintegrating tablet    Sig: Take 1 tablet (4 mg total) by mouth every 8 (eight) hours as needed for nausea or vomiting.    Dispense:  20 tablet    Refill:  0   Orders:    Follow-up plan:   Return in about 1 week (around 03/01/2024) for (F2F), (MM),  Emmy Blanch NP.          Recent Visits Date Type Provider Dept  01/14/24 Office Visit Letticia Bhattacharyya K, NP [facility]  Showing recent visits within past 90 days and meeting all other requirements Today's Visits Date Type Provider Dept  02/23/24 Office Visit Jazper Nikolai K, NP [facility]  Showing today's visits and meeting all other requirements Future Appointments Date Type Provider Dept  03/01/24 Appointment Ellanora Rayborn K, NP [facility]  04/14/24 Appointment Vasiliy Mccarry K, NP [facility]  Showing future appointments within next 90 days and meeting all other requirements  I discussed the assessment and treatment plan with the patient. The patient was provided an opportunity to ask questions and all were answered. The patient agreed with the plan and demonstrated an understanding of the instructions.  Patient advised to call back or seek an in-person evaluation if the symptoms or condition worsens.  Duration of encounter: 25 minutes.  Note by: Emmy MARLA Blanch, NP Date: 02/23/2024; Time: 11:46 AM

## 2024-02-23 NOTE — Telephone Encounter (Signed)
 PT called stated that her pharmacy is out of morphine . PT stated that the pharmacy will not transfer the prescription to another pharmacy. PT asked what else can be done. I can add patient to schedule for today for VV or F2F to discuss medication.

## 2024-02-24 ENCOUNTER — Telehealth: Payer: Self-pay | Admitting: Nurse Practitioner

## 2024-02-24 NOTE — Telephone Encounter (Signed)
 PT called stated that the oxycodone  isn't working. PT states that she can't sleep and that she is having withdrawls. PT has a appt schedule for Monday, however patient will like to come in on tomorrow. Please give patient a call. TY

## 2024-02-25 NOTE — Telephone Encounter (Signed)
 Patient coming in 02/26/2024

## 2024-02-26 ENCOUNTER — Encounter: Payer: Self-pay | Admitting: Nurse Practitioner

## 2024-02-26 ENCOUNTER — Ambulatory Visit: Attending: Nurse Practitioner | Admitting: Nurse Practitioner

## 2024-02-26 VITALS — BP 156/83 | HR 77 | Temp 96.8°F | Resp 18 | Ht 65.5 in | Wt 170.0 lb

## 2024-02-26 DIAGNOSIS — M5414 Radiculopathy, thoracic region: Secondary | ICD-10-CM | POA: Diagnosis not present

## 2024-02-26 DIAGNOSIS — R1011 Right upper quadrant pain: Secondary | ICD-10-CM | POA: Diagnosis not present

## 2024-02-26 DIAGNOSIS — M542 Cervicalgia: Secondary | ICD-10-CM | POA: Diagnosis not present

## 2024-02-26 DIAGNOSIS — G894 Chronic pain syndrome: Secondary | ICD-10-CM | POA: Diagnosis not present

## 2024-02-26 DIAGNOSIS — M545 Low back pain, unspecified: Secondary | ICD-10-CM | POA: Insufficient documentation

## 2024-02-26 DIAGNOSIS — R1012 Left upper quadrant pain: Secondary | ICD-10-CM | POA: Diagnosis not present

## 2024-02-26 DIAGNOSIS — Z79899 Other long term (current) drug therapy: Secondary | ICD-10-CM | POA: Insufficient documentation

## 2024-02-26 DIAGNOSIS — G8929 Other chronic pain: Secondary | ICD-10-CM | POA: Diagnosis not present

## 2024-02-26 DIAGNOSIS — R109 Unspecified abdominal pain: Secondary | ICD-10-CM | POA: Diagnosis not present

## 2024-02-26 MED ORDER — KETOROLAC TROMETHAMINE 60 MG/2ML IM SOLN
60.0000 mg | Freq: Once | INTRAMUSCULAR | Status: AC
  Administered 2024-02-26: 60 mg via INTRAMUSCULAR
  Filled 2024-02-26: qty 2

## 2024-02-26 MED ORDER — BUPRENORPHINE 5 MCG/HR TD PTWK: 1.0000 | MEDICATED_PATCH | TRANSDERMAL | 0 refills | Status: DC

## 2024-02-26 MED ORDER — METHOCARBAMOL 1000 MG/10ML IJ SOLN
200.0000 mg | Freq: Once | INTRAMUSCULAR | Status: AC
  Administered 2024-02-26: 200 mg via INTRAMUSCULAR
  Filled 2024-02-26: qty 10

## 2024-02-26 NOTE — Progress Notes (Signed)
 PROVIDER NOTE: Interpretation of information contained herein should be left to medically-trained personnel. Specific patient instructions are provided elsewhere under Patient Instructions section of medical record. This document was created in part using AI and STT-dictation technology, any transcriptional errors that may result from this process are unintentional.  Patient: Kari Gibbs  Service: E/M   PCP: Kari Harvey, MD  DOB: 02/20/1951  DOS: 02/26/2024  Provider: Emmy MARLA Blanch, NP  MRN: [id number]  Delivery: Face-to-face  Specialty: Interventional Pain Management  Type: Established Patient  Setting: Ambulatory outpatient facility  Specialty designation: 09  Referring Prov.: Kari Harvey, MD  Location: Outpatient office facility       History of present illness (HPI) Kari Gibbs, a 73 y.o. year old female, is here today because of her Chronic pain syndrome [G89.4]. Kari Gibbs primary complain today is Back Pain  Pertinent problems: Kari Gibbs  Chronic pain syndrome; Chronic flank pain (1ry area of pain) (left); Chronic thoracic radicular pain (Left-sided); Chronic occipital neuralgia (Left); Neuropathic pain; Neurogenic pain; History of rib fracture (4th and 8th rib) (Left); Chronic use of opiate; Trigger point with back pain (Left); Rib pain (Left); and Opioid induced constipation (OIC) on their pertinent problem list.   Pain Assessment: Severity of Chronic pain is reported as a 9 /10. Location: Back Lower, Left/around left flank to left mid abdomen. Onset: More than a month ago. Quality: Throbbing, Jabbing, Other (Comment) (hits in streaks). Timing: Constant. Modifying factor(s): morphine . Vitals:  height is 5' 5.5 (1.664 m) and weight is 170 lb (77.1 kg). Her temperature is 96.8 F (36 C) (abnormal). Her blood pressure is 156/83 (abnormal) and her pulse is 77. Her respiration is 18 and oxygen saturation is 100%.  BMI: Estimated body mass index is 27.86 kg/m as  calculated from the following:   Height as of this encounter: 5' 5.5 (1.664 m).   Weight as of this encounter: 170 lb (77.1 kg).  Last encounter: 02/23/2024. Last procedure: Visit date not found.  Reason for encounter: medication management.   The patient indicates doing well with current medication regimen; however the patient is currently unable to obtain morphine  due to an Scientist, clinical (histocompatibility and immunogenetics).  We discussed alternative option to start Oxycodone -acetaminophen  (Percocet) 10-325 mg every 8 hours as needed for pain control for up to 7 days only on February 23, 2024 with virtual visit.  The patient presents with the low back pain and nausea with Percocet.  We discussed other alternative options and decided to initiate Butrans  patch 5 mcg/hr, to be applied once weekly for 30 days.  Due to the patient's current pain level and signs of withdrawal symptoms, she expressed interest in receiving Toradol  and Robaxin  injection for pain relief.  The nurse counted the remaining Percocet and discarded appropriately according to the clinic protocol.  Pharmacotherapy Assessment   Buprenorphine  (Butrans ) 5 mcg/hr, 1 patch onto the skin once a week for 28 days. Monitoring:   [place] PMP: PDMP reviewed during this encounter.       Pharmacotherapy: No side-effects or adverse reactions reported. Compliance: No problems identified. Effectiveness: Clinically acceptable.  Kari Gibbs, [place]  02/26/2024  2:13 PM  Sign when Signing Visit Oxycodone  /Apap  6 of 21 pills, taken to discard at Outpatient Pharmacy. Per Policy. Kari Dorlene Dayna Devere, RN  02/26/2024  1:43 PM  Sign when Signing Visit Nursing Pain Medication Assessment:  Safety precautions to be maintained throughout the outpatient stay will include: orient to surroundings, keep bed in low position, maintain call  bell within reach at all times, provide assistance with transfer out of bed and ambulation.  Medication Inspection Compliance: Pill count conducted under  aseptic conditions, in front of the patient. Neither the pills nor the bottle was removed from the patient's sight at any time. Once count was completed pills were immediately returned to the patient in their original bottle.  Medication: Oxycodone /APAP Pill/Patch Count: 6 of 21 pills/patches remain Pill/Patch Appearance: Markings consistent with prescribed medication Bottle Appearance: Standard pharmacy container. Clearly labeled. Filled Date: 7 / 31 / 2025 Last Medication intake:  Today  Pt states she has more of this med in pill box at home.    UDS:  Summary  Date Value Ref Range Status  01/14/2024 FINAL  Final    Comment:    ==================================================================== [employer] Select 13 (MW) ==================================================================== Test                             Result       Flag       Units  Drug Present and Declared for Prescription Verification   Morphine                        >6536        EXPECTED   ng/mg creat   Normorphine                    371          EXPECTED   ng/mg creat    Potential sources of large amounts of morphine  in the absence of    codeine  include administration of morphine  or use of heroin.     Normorphine is an expected metabolite of morphine .    Hydromorphone                   139          EXPECTED   ng/mg creat    Hydromorphone  may be present as a metabolite of morphine ;    concentrations of hydromorphone  rarely exceed 5% of the morphine     concentration when this is the source of hydromorphone .  ==================================================================== Test                      Result    Flag   Units      Ref Range   Creatinine              153              mg/dL      >=79 ==================================================================== Declared Medications:  The flagging and interpretation on this report are based on the  following declared medications.  Unexpected results may  arise from  inaccuracies in the declared medications.   **Note: The testing scope of this panel includes these medications:   Morphine  (MSIR)   **Note: The testing scope of this panel does not include the  following reported medications:   Acetaminophen  (Tylenol )  Benzonatate  (Tessalon )  Biotin  Bupropion (Wellbutrin XL)  Buspirone (Buspar)  Cranberry  Denosumab  (Prolia )  Diclofenac  (Voltaren )  Estradiol (Estrace)  Eye Drops  Folic Acid  Irbesartan (Avapro)  Iron  Lansoprazole (Prevacid)  Loratadine (Claritin)  Magnesium  Montelukast  (Singulair )  Multivitamin  Naloxone  (Narcan )  Potassium  Probiotic  Supplement  Vitamin B12  Vitamin C  Vitamin D3 ==================================================================== For clinical consultation, please call [phone]. ====================================================================     No  results found for: CBDTHCR No results found for: D8THCCBX No results found for: D9THCCBX  ROS  Constitutional: Denies any fever or chills Gastrointestinal: No reported hemesis, hematochezia, vomiting, or acute GI distress Musculoskeletal: Denies any acute onset joint swelling, redness, loss of ROM, or weakness Neurological: No reported episodes of acute onset apraxia, aphasia, dysarthria, agnosia, amnesia, paralysis, loss of coordination, or loss of consciousness  Medication Review  Biotin, Cranberry, Iron-Vitamin C, Magnesium, Peppermint Oil, Polyethyl Glycol-Propyl Glycol, Prenatal MV & Min w/FA-DHA, RA Probiotic Gummies, Vitamin D3, acetaminophen , benzonatate , buPROPion, buprenorphine , busPIRone, denosumab , diclofenac  Sodium, estradiol, irbesartan, lansoprazole, loratadine, montelukast , morphine , ondansetron , potassium gluconate, and vitamin B-12  History Review  Allergy: Ms. Chumney is allergic to ace inhibitors, losartan potassium, neosporin [neomycin-bacitracin zn-polymyx], and bacitracin-polymyxin b. Drug:  Ms. Schurman  reports no history of drug use. Alcohol:  reports current alcohol use. Tobacco:  reports that she has never smoked. She has never used smokeless tobacco. Social: Ms. Gibbard  reports that she has never smoked. She has never used smokeless tobacco. She reports current alcohol use. She reports that she does not use drugs. 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 Surgical: Ms. 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 Family: family history includes Alzheimer's disease in her father; Hypertension in her mother.  Laboratory Chemistry Profile   Renal Lab Results  Component Value Date   BUN 19 11/15/2021   CREATININE 0.90 11/15/2021   GFRAA >60 01/04/2016   GFRNONAA >60 11/15/2021    Hepatic Lab Results  Component Value Date   AST 29 04/04/2021   ALT 30 04/04/2021   ALBUMIN 4.6 04/04/2021   ALKPHOS 89 04/04/2021   AMYLASE 30 02/27/2010    LIPASE 22 10/12/2012    Electrolytes Lab Results  Component Value Date   NA 140 11/15/2021   K 5.2 (H) 11/15/2021   CL 105 11/15/2021   CALCIUM 9.4 11/15/2021   MG 2.1 01/04/2016    Bone Lab Results  Component Value Date   25OHVITD1 49 01/04/2016   25OHVITD2 25 01/04/2016   25OHVITD3 24 01/04/2016    Inflammation (CRP: Acute Phase) (ESR: Chronic Phase) Lab Results  Component Value Date   CRP <0.5 01/04/2016   ESRSEDRATE 17 01/04/2016         Note: Above Lab results reviewed.  Recent Imaging Review  DG PAIN CLINIC C-ARM 1-60 MIN NO REPORT Fluoro was used, but no Radiologist interpretation will be provided.  Please refer to NOTES tab for provider progress note. Note: Reviewed        Physical Exam  Vitals: BP (!) 156/83   Pulse 77   Temp (!) 96.8 F (36 C)   Resp 18   Ht 5' 5.5 (1.664 m)   Wt 170 lb (77.1 kg)  SpO2 100%   BMI 27.86 kg/m  BMI: Estimated body mass index is 27.86 kg/m as calculated from the following:   Height as of this encounter: 5' 5.5 (1.664 m).   Weight as of this encounter: 170 lb (77.1 kg). Ideal: Ideal body weight: 58.2 kg (128 lb 3.2 oz) Adjusted ideal body weight: 65.7 kg (144 lb 14.7 oz) General appearance: Well nourished, well developed, and well hydrated. In no apparent acute distress Mental status: Alert, oriented x 3 (person, place, & time)       Respiratory: No evidence of acute respiratory distress Eyes: PERLA   Assessment   Diagnosis Status  1. Chronic pain syndrome   2. Chronic left flank pain   3. Chronic bilateral low back pain without sciatica   4. Encounter for medication management   5. Chronic bilateral upper abdominal pain   6. Chronic neck pain   7. Radicular pain of thoracic region    Controlled Controlled Controlled   Updated Problems: No problems updated.  Plan of Care  Problem-specific:  Assessment and Plan  Prescribing drug monitoring (PDMP) reviewed; findings consistent with the morphine   (Prescribed medication).  Discontinue Percocet 10-325 mg tablet due to the side effects and nausea.   Started on Butrans  patch 5 mcg/hr, 1 patch onto the skin once a week for 28 days.  Schedule follow-up in 1 month for medication evaluation.   Ms. Madeliene Tejera has a current medication list which includes the following long-term medication(s): bupropion, diclofenac  sodium, lansoprazole, morphine , [START ON 03/22/2024] morphine , and morphine .  Pharmacotherapy (Medications Ordered): Meds ordered this encounter  Medications   ketorolac  (TORADOL ) injection 60 mg   methocarbamol  (ROBAXIN ) injection 200 mg   buprenorphine  (BUTRANS ) 5 MCG/HR PTWK    Sig: Place 1 patch onto the skin once a week for 28 days.    Dispense:  4 patch    Refill:  0    Chronic Pain: STOP Act (Not applicable) Fill 1 day early if closed on refill date. Avoid benzodiazepines within 8 hours of opioids   Orders:  No orders of the defined types were placed in this encounter.       Return in about 1 month (around 03/28/2024) for (F2F), (MM), Kari Blanch NP.    Recent Visits Date Type Provider Dept  02/23/24 Office Visit Ryver Poblete K, NP [facility]  01/14/24 Office Visit Graziella Connery K, NP [facility]  Showing recent visits within past 90 days and meeting all other requirements Today's Visits Date Type Provider Dept  02/26/24 Office Visit Charelle Petrakis K, NP [facility]  Showing today's visits and meeting all other requirements Future Appointments Date Type Provider Dept  03/18/24 Appointment Tanara Turvey K, NP [facility]  Showing future appointments within next 90 days and meeting all other requirements  I discussed the assessment and treatment plan with the patient. The patient was provided an opportunity to ask questions and all were answered. The patient agreed with the plan and demonstrated an understanding of the instructions.  Patient advised to call back or seek  an in-person evaluation if the symptoms or condition worsens.  Duration of encounter: 30 minutes.  Total time on encounter, as per AMA guidelines included both the face-to-face and non-face-to-face time personally spent by the physician and/or other qualified health care professional(s) on the day of the encounter (includes time in activities that require the physician or other qualified health care professional and does not include time in activities normally performed  by clinical staff). Physician's time may include the following activities when performed: Preparing to see the patient (e.g., pre-charting review of records, searching for previously ordered imaging, lab work, and nerve conduction tests) Review of prior analgesic pharmacotherapies. Reviewing PMP Interpreting ordered tests (e.g., lab work, imaging, nerve conduction tests) Performing post-procedure evaluations, including interpretation of diagnostic procedures Obtaining and/or reviewing separately obtained history Performing a medically appropriate examination and/or evaluation Counseling and educating the patient/family/caregiver Ordering medications, tests, or procedures Referring and communicating with other health care professionals (when not separately reported) Documenting clinical information in the electronic or other health record Independently interpreting results (not separately reported) and communicating results to the patient/ family/caregiver Care coordination (not separately reported)  Note by: Michal Callicott K Quention Mcneill, NP (TTS and AI technology used. I apologize for any typographical errors that were not detected and corrected.) Date: 02/26/2024; Time: 2:22 PM

## 2024-02-26 NOTE — Progress Notes (Signed)
 Nursing Pain Medication Assessment:  Safety precautions to be maintained throughout the outpatient stay will include: orient to surroundings, keep bed in low position, maintain call bell within reach at all times, provide assistance with transfer out of bed and ambulation.  Medication Inspection Compliance: Pill count conducted under aseptic conditions, in front of the patient. Neither the pills nor the bottle was removed from the patient's sight at any time. Once count was completed pills were immediately returned to the patient in their original bottle.  Medication: Oxycodone /APAP Pill/Patch Count: 6 of 21 pills/patches remain Pill/Patch Appearance: Markings consistent with prescribed medication Bottle Appearance: Standard pharmacy container. Clearly labeled. Filled Date: 7 / 42 / 2025 Last Medication intake:  Today  Pt states she has more of this med in pill box at home.

## 2024-02-26 NOTE — Progress Notes (Signed)
 Oxycodone  /Apap  6 of 21 pills, taken to discard at Outpatient Pharmacy. Per Policy. Dorthea Sayres

## 2024-02-27 ENCOUNTER — Other Ambulatory Visit: Payer: Self-pay

## 2024-02-27 ENCOUNTER — Emergency Department (HOSPITAL_BASED_OUTPATIENT_CLINIC_OR_DEPARTMENT_OTHER)
Admission: EM | Admit: 2024-02-27 | Discharge: 2024-02-27 | Disposition: A | Discharge status: Home / Self Care | Source: Ambulatory Visit | Attending: Emergency Medicine | Admitting: Emergency Medicine

## 2024-02-27 DIAGNOSIS — R0789 Other chest pain: Secondary | ICD-10-CM | POA: Diagnosis not present

## 2024-02-27 DIAGNOSIS — M546 Pain in thoracic spine: Secondary | ICD-10-CM | POA: Diagnosis not present

## 2024-02-27 DIAGNOSIS — G8929 Other chronic pain: Secondary | ICD-10-CM | POA: Insufficient documentation

## 2024-02-27 DIAGNOSIS — I10 Essential (primary) hypertension: Secondary | ICD-10-CM | POA: Insufficient documentation

## 2024-02-27 DIAGNOSIS — R079 Chest pain, unspecified: Secondary | ICD-10-CM | POA: Insufficient documentation

## 2024-02-27 LAB — COMPREHENSIVE METABOLIC PANEL WITH GFR
ALT: 23 U/L (ref 0–44)
AST: 28 U/L (ref 15–41)
Albumin: 4.5 g/dL (ref 3.5–5.0)
Alkaline Phosphatase: 89 U/L (ref 38–126)
Anion gap: 13 (ref 5–15)
BUN: 15 mg/dL (ref 8–23)
CO2: 25 mmol/L (ref 22–32)
Calcium: 9.6 mg/dL (ref 8.9–10.3)
Chloride: 107 mmol/L (ref 98–111)
Creatinine, Ser: 0.97 mg/dL (ref 0.44–1.00)
GFR, Estimated: >60 mL/min (ref >60)
Glucose, Bld: 116 mg/dL — ABNORMAL HIGH (ref 70–99)
Potassium: 3.2 mmol/L — ABNORMAL LOW (ref 3.5–5.1)
Sodium: 144 mmol/L (ref 135–145)
Total Bilirubin: 0.7 mg/dL (ref 0.0–1.2)
Total Protein: 6.6 g/dL (ref 6.5–8.1)

## 2024-02-27 LAB — TROPONIN T, HIGH SENSITIVITY: Troponin T High Sensitivity: <15 ng/L (ref <19)

## 2024-02-27 LAB — CBC WITH DIFFERENTIAL/PLATELET
Abs Immature Granulocytes: 0.02 K/uL (ref 0.00–0.07)
Basophils Absolute: 0 K/uL (ref 0.0–0.1)
Basophils Relative: 0 %
Eosinophils Absolute: 0.1 K/uL (ref 0.0–0.5)
Eosinophils Relative: 1 %
HCT: 39.3 % (ref 36.0–46.0)
Hemoglobin: 13.8 g/dL (ref 12.0–15.0)
Immature Granulocytes: 0 %
Lymphocytes Relative: 26 %
Lymphs Abs: 1.5 K/uL (ref 0.7–4.0)
MCH: 30.2 pg (ref 26.0–34.0)
MCHC: 35.1 g/dL (ref 30.0–36.0)
MCV: 86 fL (ref 80.0–100.0)
Monocytes Absolute: 0.5 K/uL (ref 0.1–1.0)
Monocytes Relative: 8 %
Neutro Abs: 3.5 K/uL (ref 1.7–7.7)
Neutrophils Relative %: 65 %
Platelets: 296 K/uL (ref 150–400)
RBC: 4.57 MIL/uL (ref 3.87–5.11)
RDW: 13.9 % (ref 11.5–15.5)
WBC: 5.5 K/uL (ref 4.0–10.5)
nRBC: 0 % (ref 0.0–0.2)

## 2024-02-27 MED ORDER — LIDOCAINE 5 % EX PTCH
1.0000 | MEDICATED_PATCH | CUTANEOUS | Status: DC
  Administered 2024-02-27: 1 via TRANSDERMAL
  Filled 2024-02-27: qty 1

## 2024-02-27 MED ORDER — ONDANSETRON HCL 4 MG/2ML IJ SOLN
4.0000 mg | Freq: Once | INTRAMUSCULAR | Status: AC
  Administered 2024-02-27: 4 mg via INTRAVENOUS
  Filled 2024-02-27: qty 2

## 2024-02-27 MED ORDER — MORPHINE SULFATE (PF) 4 MG/ML IV SOLN
4.0000 mg | Freq: Once | INTRAVENOUS | Status: AC
  Administered 2024-02-27: 4 mg via INTRAVENOUS
  Filled 2024-02-27: qty 1

## 2024-02-27 MED ORDER — POTASSIUM CHLORIDE CRYS ER 20 MEQ PO TBCR
40.0000 meq | EXTENDED_RELEASE_TABLET | Freq: Once | ORAL | Status: AC
  Administered 2024-02-27: 40 meq via ORAL
  Filled 2024-02-27: qty 2

## 2024-02-27 NOTE — ED Provider Notes (Signed)
 [place] [facility]PARTMENT AT [facility] Provider Note   CSN: [id number] Arrival date & time: 02/27/24  9060     Patient presents with: No chief complaint on file.   Kari Gibbs is a 73 y.o. female.   HPI    73yo female with history of hypertension, chronic flank pain, chronic pain syndrome, thoracic radicular pain, occipital neuralgia, neuropathic pain, history of rib fractures on the left who presents with left sided pain.    She saw her chronic pain physician yesterday and was found to be unable to obtain morphine  due to nationwide shortage, discussed starting oxycodone -acetaminophen  10-3 25 every 8 hours--- this was started but she developed nausea and they decided to start a Butrans  patch 5 mcg/h to be applied once weekly for 30 days.  She also expressed interest in receiving Toradol  and Robaxin  for pain relief due to severe pain and withdrawal symptoms.   Last percocet yesterday morning, patches not present at pharmacy, and hope that they help with pain/withdrawal  Feels like her pain she has had.  Middle of left side and through to the back, attributed to nerve damage, had gastric bypass 2012, since 2014 since then  pressure, electrical feelings, pain clinic tried nerve blocks but don't last long.     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 Authorizing Provider  acetaminophen  (TYLENOL ) 500 MG tablet Take 500-1,000 mg by mouth every 6 (six) hours as needed for headache.    [provider]  benzonatate  (TESSALON ) 200 MG capsule Take 200 mg by mouth as needed.    [provider]  Biotin 1000 MCG CHEW Chew 2,500 mcg by mouth every evening.    [provider]  buprenorphine  (BUTRANS ) 5 MCG/HR PTWK Place 1 patch onto the skin once a week for 28 days. 02/26/24 03/25/24  Patel, Seema K, NP  buPROPion (WELLBUTRIN XL) 150 MG 24 hr tablet Take 450 mg by mouth in the morning.    [provider]  busPIRone (BUSPAR) 15 MG tablet Take 15 mg by mouth 2 (two) times daily. 03/18/17   [provider]  Cholecalciferol (VITAMIN D3) LIQD Take 4 drops by mouth in the morning.    [provider]  Cranberry 1000 MG CAPS Take 2 capsules by mouth every evening.    [provider]  denosumab  (PROLIA ) 60 MG/ML SOSY injection Inject 60 mg into the skin every 6 (six) months.  [provider]  diclofenac  Sodium (VOLTAREN ) 1 % GEL Apply 4 g topically 4 (four) times daily as needed. 06/13/20 02/26/24  Tanya Glisson, MD  estradiol (ESTRACE) 0.1 MG/GM vaginal cream Place 1 Applicatorful vaginally at bedtime.    [provider]  irbesartan (AVAPRO) 75 MG tablet Take 75 mg by mouth in the morning.    [provider]  Iron-Vitamin C (VITRON-C) 65-125 MG TABS Take 1 tablet by mouth daily.    [provider]  lansoprazole (PREVACID) 30 MG capsule Take 30 mg by mouth daily at 12 noon.    [provider]  loratadine (CLARITIN) 10 MG tablet Take 10 mg by mouth in the morning.    [provider]  Magnesium 500 MG TABS Take 500 mg by mouth 2 (two) times daily.     [provider]  montelukast  (SINGULAIR ) 10 MG tablet Take 10 mg by mouth at bedtime.    [provider]  morphine  (MSIR) 15 MG tablet Take 1 tablet (15 mg total) by mouth every 8 (eight) hours as needed. Must last 30 days. Patient not taking: Reported on 02/26/2024 02/21/24 03/22/24  Patel, Seema K, NP  morphine  (MSIR) 15 MG tablet Take 1 tablet (15 mg total) by mouth every 8 (eight) hours as needed. Must last 30 days. Patient not taking: Reported on 02/26/2024 03/22/24 04/21/24  Patel, Seema K, NP  morphine  (MSIR) 15 MG tablet Take 1 tablet (15 mg total) by mouth every 8 (eight) hours as needed. Must last 30 days. Patient not taking: Reported on 02/26/2024 01/22/24 02/21/24  Patel, Seema K, NP  ondansetron  (ZOFRAN -ODT) 4 MG disintegrating tablet Take 1 tablet (4 mg total) by mouth every 8 (eight) hours as needed for nausea or vomiting. 02/23/24   Patel, Seema K, NP  Peppermint Oil (PEPOGEST PO) Take 1 tablet by mouth daily as needed (nausea).    [provider]  Polyethyl Glycol-Propyl Glycol (SYSTANE ULTRA) 0.4-0.3 % SOLN Place 1-2 drops into both eyes 3 (three) times daily as needed (dry/irritated eyes.).    [provider]  potassium gluconate 595 (99 K) MG TABS tablet Take 595 mg by mouth daily as needed (chest sensation (low potassium)).    [provider]  Prenatal MV & Min w/FA-DHA (PRENATAL GUMMIES PO) Take 2 tablets by mouth in the morning.    [provider]  Probiotic Product (RA PROBIOTIC GUMMIES) CHEW Chew 2 tablets by mouth every evening.    [provider]  vitamin B-12 (CYANOCOBALAMIN ) 500 MCG tablet Take 500 mcg by mouth in the morning. Every 3rd day    [provider]    Allergies: Ace inhibitors, Losartan potassium, Neosporin [neomycin-bacitracin zn-polymyx], and Bacitracin-polymyxin b    Review of Systems  Updated Vital Signs BP 129/86   Pulse 70   Temp 98.7 F (37.1 C) (Oral)   Resp 12   SpO2 100%   Physical Exam Vitals and  nursing note reviewed.  Constitutional:      General: She is not in acute distress.    Appearance: She is well-developed. She is not diaphoretic.  HENT:     Head: Normocephalic and atraumatic.  Eyes:     Conjunctiva/sclera: Conjunctivae normal.  Cardiovascular:     Rate and Rhythm: Normal rate and regular rhythm.     Heart sounds: Normal heart sounds. No murmur heard.    No friction rub. No gallop.  Pulmonary:     Effort: Pulmonary effort is normal. No respiratory distress.  Breath sounds: Normal breath sounds. No wheezing or rales.  Chest:     Chest wall: Tenderness present.  Abdominal:     General: There is no distension.     Palpations: Abdomen is soft.     Tenderness: There is no abdominal tenderness. There is no guarding.  Musculoskeletal:        General: No tenderness.     Cervical back: Normal range of motion.  Skin:    General: Skin is warm and dry.     Findings: No erythema or rash.  Neurological:     Mental Status: She is alert and oriented to person, place, and time.     (all labs ordered are listed, but only abnormal results are displayed) Labs Reviewed  COMPREHENSIVE METABOLIC PANEL WITH GFR - Abnormal; Notable for the following components:      Result Value   Potassium 3.2 (*)    Glucose, Bld 116 (*)    All other components within normal limits  CBC WITH DIFFERENTIAL/PLATELET  TROPONIN T, HIGH SENSITIVITY  TROPONIN T, HIGH SENSITIVITY    EKG: EKG Interpretation Date/Time:  Friday February 27 2024 10:06:56 EDT Ventricular Rate:  66 PR Interval:  179 QRS Duration:  90 QT Interval:  460 QTC Calculation: 482 R Axis:   80  Text Interpretation: Sinus rhythm Anteroseptal infarct, age indeterminate No significant change since last tracing Confirmed by Dreama Longs ([id number]) on 02/27/2024 11:32:27 AM  Radiology: No results found.   Procedures   Medications Ordered in the ED  morphine  (PF) 4 MG/ML injection 4 mg (4 mg Intravenous Given 02/27/24 1122)   ondansetron  (ZOFRAN ) injection 4 mg (4 mg Intravenous Given 02/27/24 1122)  potassium chloride  SA (KLOR-CON  M) CR tablet 40 mEq (40 mEq Oral Given 02/27/24 1200)                                    73yo female with history of hypertension, chronic flank pain, chronic pain syndrome, thoracic radicular pain, occipital neuralgia, neuropathic pain, history of rib fractures on the left who presents with left sided pain.   DDx includes ACS, dissection, PE, pneumothorax, pneumonia, nephrolithiasis, pyelonephritis, chronic pain.   EKG without acute changes in comparison to prior.   Labs completed and evaluated by me without anemia nor leukocytosis.  Mild hypokalemia. No clinically significant abnormalities.  Troponin normal. Given morphine , lidocaine  patch, and K.    She is adamant that this pain she is experiencing is the same pain she has had since 2014, however has been exacerbated by being out of her home pain medications.  Given duration of symptoms, normal pulses, have lower suspicion for aortic dissection, ACS, PE, pneumonia, pneumothorax.  Feels improved after medications. Is hopeful that her prescription will be ready at the pharmacy today. Given lidocaine  patch. Patient discharged in stable condition with understanding of reasons to return.       Final diagnoses:  Left-sided chest pain  Chronic left-sided thoracic back pain    ED Discharge Orders     None          Dreama Longs, MD 02/27/24 2140

## 2024-02-27 NOTE — ED Notes (Signed)
 Reviewed AVS/discharge instruction with patient. Time allotted for and all questions answered. Patient is agreeable for d/c and escorted to ed exit by staff.

## 2024-02-27 NOTE — ED Triage Notes (Signed)
 Pt caox4, ambulatory c/o chronic pain with withdrawal from pain meds. Pt has been on morphine  15mg  for chronic pain, could not get a refill over the weekend, got oxycodone  from pain clinic Monday which has not relieved the pain. Toradol  and robaxin  IM given yesterday, last narcotic taken yest morning. Patched prescribed were unavailable at their pharmacy along with the Morphine .

## 2024-03-01 ENCOUNTER — Telehealth: Payer: Self-pay | Admitting: Pain Medicine

## 2024-03-01 ENCOUNTER — Encounter: Admitting: Nurse Practitioner

## 2024-03-01 ENCOUNTER — Telehealth: Payer: Self-pay | Admitting: Nurse Practitioner

## 2024-03-01 NOTE — Telephone Encounter (Signed)
 Patient called back, I informed her that no additional opioids will be written.

## 2024-03-01 NOTE — Telephone Encounter (Signed)
 Spoke with Seema, cannot prescribe Oxycodone  because patient tried it recently and it did cause nausea.  Patient brought the remaining Percocot back to the office for discard.  Attempted to call patient, no answer, unable to leave a message.

## 2024-03-01 NOTE — Telephone Encounter (Signed)
 PT husband called stated they aren't happy with the services and will like a EVAL with [facility]. I explain to patient husband I could schedule patient with [facility]. PT husband stated no they want a vv appt because they live in [place]. Please give patient a call. TY

## 2024-03-01 NOTE — Telephone Encounter (Signed)
 Began Buprenorphine  on 02-26-24 due to Morphine  being out of stock.

## 2024-03-01 NOTE — Telephone Encounter (Signed)
 Patient states the patches are not helping her pain and still unable to get morphine . Patient states she is still hurting and unable to rest. Please advise

## 2024-03-02 ENCOUNTER — Encounter: Payer: Self-pay | Admitting: Pain Medicine

## 2024-03-02 ENCOUNTER — Ambulatory Visit: Attending: Pain Medicine | Admitting: Pain Medicine

## 2024-03-02 VITALS — BP 149/91 | Temp 98.5°F | Resp 18 | Ht 65.0 in | Wt 165.0 lb

## 2024-03-02 DIAGNOSIS — Z79899 Other long term (current) drug therapy: Secondary | ICD-10-CM | POA: Diagnosis not present

## 2024-03-02 DIAGNOSIS — R110 Nausea: Secondary | ICD-10-CM | POA: Insufficient documentation

## 2024-03-02 DIAGNOSIS — M545 Low back pain, unspecified: Secondary | ICD-10-CM | POA: Insufficient documentation

## 2024-03-02 DIAGNOSIS — F1123 Opioid dependence with withdrawal: Secondary | ICD-10-CM | POA: Diagnosis present

## 2024-03-02 DIAGNOSIS — G8929 Other chronic pain: Secondary | ICD-10-CM | POA: Insufficient documentation

## 2024-03-02 DIAGNOSIS — Z79891 Long term (current) use of opiate analgesic: Secondary | ICD-10-CM | POA: Insufficient documentation

## 2024-03-02 DIAGNOSIS — R109 Unspecified abdominal pain: Secondary | ICD-10-CM | POA: Diagnosis not present

## 2024-03-02 DIAGNOSIS — M542 Cervicalgia: Secondary | ICD-10-CM | POA: Insufficient documentation

## 2024-03-02 DIAGNOSIS — R1011 Right upper quadrant pain: Secondary | ICD-10-CM | POA: Insufficient documentation

## 2024-03-02 DIAGNOSIS — G894 Chronic pain syndrome: Secondary | ICD-10-CM | POA: Insufficient documentation

## 2024-03-02 DIAGNOSIS — R1012 Left upper quadrant pain: Secondary | ICD-10-CM | POA: Insufficient documentation

## 2024-03-02 MED ORDER — PREDNISONE 20 MG PO TABS
ORAL_TABLET | ORAL | 0 refills | Status: AC
Start: 2024-03-02 — End: 2024-03-11

## 2024-03-02 MED ORDER — METHOCARBAMOL 1000 MG/10ML IJ SOLN
200.0000 mg | Freq: Once | INTRAMUSCULAR | Status: AC
  Administered 2024-03-02: 200 mg via INTRAMUSCULAR
  Filled 2024-03-02: qty 10

## 2024-03-02 MED ORDER — KETOROLAC TROMETHAMINE 60 MG/2ML IM SOLN
60.0000 mg | Freq: Once | INTRAMUSCULAR | Status: AC
  Administered 2024-03-02: 60 mg via INTRAMUSCULAR
  Filled 2024-03-02: qty 2

## 2024-03-02 MED ORDER — BUPRENORPHINE 7.5 MCG/HR TD PTWK: 1.0000 | MEDICATED_PATCH | TRANSDERMAL | 0 refills | Status: DC

## 2024-03-02 NOTE — Progress Notes (Signed)
 Nursing Pain Medication Assessment:  Safety precautions to be maintained throughout the outpatient stay will include: orient to surroundings, keep bed in low position, maintain call bell within reach at all times, provide assistance with transfer out of bed and ambulation.  Medication Inspection Compliance: Pill count conducted under aseptic conditions, in front of the patient. Neither the pills nor the bottle was removed from the patient's sight at any time. Once count was completed pills were immediately returned to the patient in their original bottle.  Medication: Buprenorphine  patch (Butrans ) Pill/Patch Count: 3 of 4 pills/patches remain Pill/Patch Appearance: Markings consistent with prescribed medication Bottle Appearance: Standard pharmacy container. Clearly labeled. Filled Date: 07 / 18 / 2025 Last Medication intake:  02-27-2024

## 2024-03-02 NOTE — Progress Notes (Signed)
 PROVIDER NOTE: Interpretation of information contained herein should be left to medically-trained personnel. Specific patient instructions are provided elsewhere under Patient Instructions section of medical record. This document was created in part using AI and STT-dictation technology, any transcriptional errors that may result from this process are unintentional.  Patient: Kari Gibbs  Service: E/M   PCP: Kari Harvey, MD  DOB: 22-Jul-1951  DOS: 03/02/2024  Provider: Eric DELENA Como, MD  MRN: [id number]  Delivery: Face-to-face  Specialty: Interventional Pain Management  Type: Established Patient  Setting: Ambulatory outpatient facility  Specialty designation: 09  Referring Prov.: Kari Harvey, MD  Location: Outpatient office facility       History of present illness (HPI) Kari Gibbs, a 73 y.o. year old female, is here today because of her Chronic left flank pain [R10.9, G89.29]. Kari Gibbs primary complain today is Abdominal Pain (Radiates around to back)  Pertinent problems: Kari Gibbs has Chronic pain syndrome; Chronic flank pain (1ry area of Pain) (Left); Chronic thoracic radicular pain (Left-sided); Chronic occipital neuralgia (Left); Neuropathic pain; Neurogenic pain; History of rib fracture (4th and 8th rib) (Left); Vertebral body hemangioma (T11); Metatarsalgia of foot (Right); Chronic abdominal pain (2ry area of Pain) (Bilateral) (L>R); Primary osteoarthritis; Chronic neck pain (4th area of Pain) (Bilateral) (L>R); Cervical facet syndrome (Bilateral) (L>R); Muscle spasticity; Tremors of nervous system; Muscle twitching; Cervical spondylosis; Chronic low back pain (3ry area of Pain) (Bilateral) w/o sciatica; Chronic low back pain (Bilateral) w/ sciatica (Bilateral); Chronic sacroiliac joint pain (Bilateral); Other specified dorsopathies, sacral and sacrococcygeal region; Chronic sacroiliac joint somatic dysfunction (Bilateral); Epigastric pain; Left upper quadrant  pain; Generalized pain; Headache; Rib pain (Left); Abnormal MRI, thoracic spine (07/28/2023); Fracture of rib (4th and 8th rib) (Left); 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  Pain Assessment: Severity of Chronic pain is reported as a 9 /10. Location: Abdomen  /radiates around to left side of back. Onset: More than a month ago. Quality: Throbbing, Jabbing. Timing: Constant. Modifying factor(s): morphine . Vitals:  height is 5' 5 (1.651 m) and weight is 165 lb (74.8 kg). Her temperature is 98.5 F (36.9 C). Her blood pressure is 149/91 (abnormal). Her respiration is 18.  BMI: Estimated body mass index is 27.46 kg/m as calculated from the following:   Height as of this encounter: 5' 5 (1.651 m).   Weight as of this encounter: 165 lb (74.8 kg).  Last encounter: 10/20/2023. Last procedure: 07/29/2023.  Reason for encounter: medication management.  The patient was previously on morphine  IR 15 mg, 1 tab p.o. every 8 hours (90/month) (45 mg/day of morphine ) (45 MME/day) and doing well until the patient was unable to secure a supply of her medication secondary to a nationwide shortage on morphine .  The last time that the patient was able to obtain a prescription for her morphine  was on 01/22/2024 when she was able to get # 90 pills filled.  Because of the above-mentioned shortage, the patient was given a prescription for oxycodone /APAP 10/325 (# 21), 1 tab p.o. 3 times daily (45 MME).  Shortly thereafter the patient indicated experiencing nausea with the oxycodone  and indicated that this was not improved with the use of Zofran .  Since she could not tolerate the oxycodone , she was then provided with a prescription for buprenorphine  patches 5 mcg/h to start switching her to a more available supply of medication since we have already had problems with [place] shortages of  oxycodone .  Butrans   (buprenorphine  patches) started on 02/26/2024, but by 03/01/2024, less than the duration of the first patch, the patient was already calling indicating that the medication was not working.  She has requested an appointment to see me.  PMP REVIEW: Morphine  IR 15 mg tablet (90/month) prescribed on 01/22/2024 and last filled on 01/22/2024.  Oxycodone /APAP 10/325, 1 tab p.o. 3 times daily (# 21) prescribed on 02/23/2024 and filled on 02/23/2024.  Buprenorphine  5 mcg/h patch (# 4) prescribed on 02/26/2024 and filled on 02/27/2024.  Discussed the use of AI scribe software for clinical note transcription with the patient, who gave verbal consent to proceed.  History of Present Illness   Kari Gibbs is a 73 year old female who presents with inadequate pain control and medication management issues. She is accompanied by a family member who assists in communicating her medication history and challenges.  She has been experiencing significant challenges in managing her pain due to a shortage of her prescribed medications. Initially, she attempted to refill her prescription on a Saturday but was unable to obtain it due to a shortage. She waited until Monday to contact her healthcare provider, who prescribed oxycodone . However, oxycodone  did not provide any relief.  By Friday, her pain was severe enough to warrant a visit to the emergency room, where she received a pain shot and a muscle relaxant. She was then prescribed arm patches, but there was a delay in obtaining them due to a shortage, and she had to wait an additional day for the supply to arrive. She started using the patches on Friday, but by Tuesday, they had not provided any significant relief.  She has a history of using morphine , with a previous dosage of 45 mg per day. She and her family reported experiencing three days of withdrawal symptoms before obtaining oxycodone  due to the medication delay. She has also experienced nausea as a side effect of  some medications, which limits her options.  Her current medication regimen includes the use of transdermal patches, but these have not been effective in managing her pain. She has previously used oxycodone  and morphine , but due to shortages and side effects, her treatment has been inconsistent.  Her family member has been actively involved in managing her medication refills and communicating with pharmacies to address the shortages. She has encountered difficulties with insurance approvals and pharmacy policies, which have further complicated her pain management.       Pharmacotherapy Assessment   Opioid Analgesic: Butrans  (buprenorphine  patch) 5 mcg/h, 1 patch q. 7 days.  (Started on 02/26/2024).   Monitoring: [place] PMP: PDMP reviewed during this encounter.       Pharmacotherapy: No side-effects or adverse reactions reported. Compliance: No problems identified. Effectiveness: Clinically acceptable.  Rebecka Wolm HERO, RN  03/02/2024  2:07 PM  Sign when Signing Visit Nursing Pain Medication Assessment:  Safety precautions to be maintained throughout the outpatient stay will include: orient to surroundings, keep bed in low position, maintain call bell within reach at all times, provide assistance with transfer out of bed and ambulation.  Medication Inspection Compliance: Pill count conducted under aseptic conditions, in front of the patient. Neither the pills nor the bottle was removed from the patient's sight at any time. Once count was completed pills were immediately returned to the patient in their original bottle.  Medication: Buprenorphine  patch (Butrans ) Pill/Patch Count: 3 of 4 pills/patches remain Pill/Patch Appearance: Markings consistent with prescribed medication Bottle Appearance: Standard pharmacy container. Clearly labeled. Filled Date:  07 / 18 / 2025 Last Medication intake:  02-27-2024    UDS:  Summary  Date Value Ref Range Status  01/14/2024 FINAL  Final    Comment:     ==================================================================== [employer] 13 (MW) ==================================================================== Test                             Result       Flag       Units  Drug Present and Declared for Prescription Verification   Morphine                        >6536        EXPECTED   ng/mg creat   Normorphine                    371          EXPECTED   ng/mg creat    Potential sources of large amounts of morphine  in the absence of    codeine  include administration of morphine  or use of heroin.     Normorphine is an expected metabolite of morphine .    Hydromorphone                   139          EXPECTED   ng/mg creat    Hydromorphone  may be present as a metabolite of morphine ;    concentrations of hydromorphone  rarely exceed 5% of the morphine     concentration when this is the source of hydromorphone .  ==================================================================== Test                      Result    Flag   Units      Ref Range   Creatinine              153              mg/dL      >=79 ==================================================================== Declared Medications:  The flagging and interpretation on this report are based on the  following declared medications.  Unexpected results may arise from  inaccuracies in the declared medications.   **Note: The testing scope of this panel includes these medications:   Morphine  (MSIR)   **Note: The testing scope of this panel does not include the  following reported medications:   Acetaminophen  (Tylenol )  Benzonatate  (Tessalon )  Biotin  Bupropion (Wellbutrin XL)  Buspirone (Buspar)  Cranberry  Denosumab  (Prolia )  Diclofenac  (Voltaren )  Estradiol (Estrace)  Eye Drops  Folic Acid  Irbesartan (Avapro)  Iron  Lansoprazole (Prevacid)  Loratadine (Claritin)  Magnesium  Montelukast  (Singulair )  Multivitamin  Naloxone  (Narcan )  Potassium  Probiotic  Supplement   Vitamin B12  Vitamin C  Vitamin D3 ==================================================================== For clinical consultation, please call [phone]. ====================================================================     No results found for: CBDTHCR No results found for: D8THCCBX No results found for: D9THCCBX  ROS  Constitutional: Denies any fever or chills Gastrointestinal: No reported hemesis, hematochezia, vomiting, or acute GI distress Musculoskeletal: Denies any acute onset joint swelling, redness, loss of ROM, or weakness Neurological: No reported episodes of acute onset apraxia, aphasia, dysarthria, agnosia, amnesia, paralysis, loss of coordination, or loss of consciousness  Medication Review  Biotin, Cranberry, Iron-Vitamin C, Magnesium, Peppermint Oil, Polyethyl Glycol-Propyl Glycol, Prenatal MV & Min w/FA-DHA, RA Probiotic Gummies, Vitamin D3, acetaminophen , benzonatate , buPROPion, buprenorphine , busPIRone, denosumab , diclofenac  Sodium, estradiol, irbesartan,  lansoprazole, loratadine, montelukast , ondansetron , potassium gluconate, predniSONE , and vitamin B-12  History Review  Allergy: Kari Gibbs is allergic to ace inhibitors, losartan potassium, neosporin [neomycin-bacitracin zn-polymyx], and bacitracin-polymyxin b. Drug: Kari Gibbs  reports no history of drug use. Alcohol:  reports current alcohol use. Tobacco:  reports that she has never smoked. She has never used smokeless tobacco. Social: Kari Gibbs  reports that she has never smoked. She has never used smokeless tobacco. She reports current alcohol use. She reports that she does not use drugs. 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 Family: family history includes Alzheimer's disease in her father; Hypertension in her mother.  Laboratory Chemistry Profile   Renal Lab Results  Component Value Date   BUN 15 02/27/2024   CREATININE 0.97 02/27/2024   GFRAA >60 01/04/2016   GFRNONAA >60 02/27/2024    Hepatic Lab Results  Component Value Date   AST 28 02/27/2024   ALT 23 02/27/2024   ALBUMIN 4.5 02/27/2024   ALKPHOS 89 02/27/2024   AMYLASE 30 02/27/2010   LIPASE 22 10/12/2012    Electrolytes Lab Results  Component Value Date   NA 144 02/27/2024   K 3.2 (L) 02/27/2024   CL 107 02/27/2024   CALCIUM 9.6 02/27/2024   MG 2.1 01/04/2016    Bone Lab Results  Component Value Date   25OHVITD1 49 01/04/2016   25OHVITD2 25 01/04/2016   25OHVITD3 24 01/04/2016    Inflammation (CRP: Acute Phase) (ESR: Chronic Phase) Lab Results  Component Value Date   CRP <0.5 01/04/2016   ESRSEDRATE 17 01/04/2016         Note: Above Lab results reviewed.  Recent Imaging Review  DG PAIN CLINIC C-ARM 1-60 MIN NO REPORT Fluoro was used, but no Radiologist interpretation will be provided.   Please refer to NOTES tab for provider progress note. Note: Reviewed        Physical Exam  Vitals: BP (!) 149/91   Temp 98.5 F (36.9 C)   Resp 18   Ht 5' 5 (1.651 m)   Wt 165 lb (74.8 kg)   BMI 27.46 kg/m  BMI: Estimated body mass index is 27.46 kg/m as calculated from the following:   Height as of this encounter: 5' 5 (1.651 m).   Weight as of this encounter: 165 lb (74.8 kg). Ideal: Ideal body weight: 57 kg (125 lb 10.6 oz) Adjusted ideal body weight: 64.1 kg (141 lb 6.4 oz) General appearance: Well nourished, well developed, and well hydrated. In no apparent acute distress Mental status: Alert, oriented x 3 (person, place, & time)       Respiratory: No evidence of acute respiratory distress Eyes: PERLA   Assessment  Diagnosis Status  1. Chronic flank pain (1ry area of Pain) (Left)   2. Chronic abdominal pain (2ry area of Pain) (Bilateral) (L>R)   3. Chronic low back pain (3ry area of Pain) (Bilateral) w/o sciatica   4. Chronic neck pain (4th area of Pain) (Bilateral) (L>R)   5. Chronic pain syndrome   6. Pharmacologic therapy   7. Chronic use of opiate for therapeutic purpose   8. Encounter for medication management   9. Encounter for chronic pain management   10. Long term prescription opiate use   11. Nausea without vomiting   12. Opioid dependence with withdrawal (HCC)    Increased Increased Increased pain secondary to nationwide disruption in the supply of pain medications.   Updated Problems: No problems updated.  Plan of Care  Problem-specific:  Assessment and Plan    Chronic pain management   Chronic pain management is complicated by nationwide shortages of narcotic medications, including morphine  and oxycodone . Current treatment with buprenorphine  patches is inadequate. She experiences difficulties obtaining medications due to shortages and regulatory changes. Buprenorphine  is used for its agonist-antagonist properties, reducing the risk of  respiratory depression. Challenges include careful titration and the impact of liver and kidney function on medication metabolism, necessitating a lower starting dose and slow titration. Prescribe buprenorphine  7.5 mg patches. Continue current 5 mg patches until 7.5 mg patches are available. Return unused and used patches in a sandwich bag for accountability. Administer Toradol  and Robaxin  injections for immediate pain relief.  Withdrawal symptoms   Withdrawal symptoms occurred due to delays in obtaining pain medication, experienced before starting current buprenorphine  patches. Managing withdrawal is difficult due to medication shortages and regulatory constraints. Continue buprenorphine  patches to manage withdrawal symptoms. Adjust treatment as necessary based on withdrawal symptoms.       Kari Gibbs has a current medication list which includes the following long-term medication(s): buprenorphine , buprenorphine , bupropion, diclofenac  sodium, and lansoprazole.  Pharmacotherapy (Medications Ordered): Meds ordered this encounter  Medications   buprenorphine  (BUTRANS ) 7.5 MCG/HR    Sig: Place 1 patch onto the skin once a week for 28 days.    Dispense:  4 patch    Refill:  0    Chronic Pain: STOP Act (Not applicable) Fill 1 day early if closed on refill date. Avoid benzodiazepines within 8 hours of opioids   predniSONE  (DELTASONE ) 20 MG tablet    Sig: Take 3 tablets (60 mg total) by mouth daily with breakfast for 3 days, THEN 2 tablets (40 mg total) daily with breakfast for 3 days, THEN 1 tablet (20 mg total) daily with breakfast for 3 days.    Dispense:  18 tablet    Refill:  0   ketorolac  (TORADOL ) injection 60 mg   methocarbamol  (ROBAXIN ) injection 200 mg   Orders:  Orders Placed This Encounter  Procedures   Informed Consent Details: Physician/Practitioner Attestation; Transcribe to consent form and obtain patient signature    Do not administer NSAIDs (Toradol , etc.) if patient  has an allergy or intolerance to NSAIDs or if patient has CKD (chronic kidney disease or failure). Avoid the use of Muscle Relaxants (orphenedrine/Norflex , etc.) if patient has allergy or intolerance to this medication.    Scheduling Instructions:     Nursing orders: Complete pain questionnaire before administration of therapy. Document location, laterality, onset, level, trigger, and current medications taken for the pain.    Physician/Practitioner attestation of informed consent for procedure/surgical case:   I, the physician/practitioner, attest that I have discussed with  the patient the benefits, risks, side effects, alternatives, likelihood of achieving goals and potential problems during recovery for the procedure that I have provided informed consent.    Procedure:   IM injection of therapeutic substance    Physician/Practitioner performing the procedure:   Emerick Weatherly A. Tanya, MD    Indication/Reason:   Acute on chronic pain     Interventional Therapies  Risk Factors  Considerations:   ALLERGY: Contrast Dye (Anaphylactic - Type)    Planned  Pending:      Under consideration:      Completed:   Therapeutic left 8th rib intercostal NB x3 (06/26/2023) (100/100/50/0)  Palliative left intercostal NB (T5 to T8) x1 (03/03/2017)  Palliative left GONB Blk x2 (07/17/2016) (100/100/90/75) Palliative bilateral SI Blk x1 (04/02/2018)    Therapeutic  Palliative (PRN) options:      Pharmacotherapy  Nonopioids transferred 05/29/2020: Gabapentin  and Voltaren  gel. (03/02/2024) nationwide shortage is on pain medications have forced us  to switch patients to less ideal and less effective medications creating a chaos and what used to be a very well-managed case.  From morphine  we had to switch to oxycodone  and since the patient presented with side effects to the medication we ended up having to switch to the buprenorphine .  Now we are titrating the dose up hoping not to get any side effects and  eventually hoping to get the pain again under control.  She has been scheduled to return on 03/08/2024 to evaluate the effectiveness of the 7.5 mcg/h Butrans  patch.  If this gets her pain under control, we will then provide her with 2 other additional months of the medication so as to get her back into the 61-month cycles.  If on the other hand this is not enough, we will need to go up to the 10 mcg/h patch.      Return in about 6 days (around 03/08/2024) for (NP (Patel's) MM schedule).    Recent Visits Date Type Provider Dept  02/26/24 Office Visit Patel, Seema K, NP [facility]  02/23/24 Office Visit Patel, Seema K, NP [facility]  01/14/24 Office Visit Patel, Seema K, NP [facility]  Showing recent visits within past 90 days and meeting all other requirements Today's Visits Date Type Provider Dept  03/02/24 Office Visit Tanya Glisson, MD [facility]  Showing today's visits and meeting all other requirements Future Appointments Date Type Provider Dept  03/08/24 Appointment Patel, Seema K, NP [facility]  03/18/24 Appointment Patel, Seema K, NP [facility]  Showing future appointments within next 90 days and meeting all other requirements  I discussed the assessment and treatment plan with the patient. The patient was provided an opportunity to ask questions and all were answered. The patient agreed with the plan and demonstrated an understanding of the instructions.  Patient advised to call back or seek an in-person evaluation if the symptoms or condition worsens.  Duration of encounter: 36 minutes.  Total time on encounter, as per AMA guidelines included both the face-to-face and non-face-to-face time personally spent by the physician and/or other qualified health care professional(s) on the day of the encounter (includes time in activities that require the physician or other qualified health care professional and does not  include time in activities normally performed by clinical staff). Physician's time may include the following activities when performed: Preparing to see the patient (e.g., pre-charting review of records, searching for previously ordered imaging, lab work, and nerve conduction tests) Review  of prior analgesic pharmacotherapies. Reviewing PMP Interpreting ordered tests (e.g., lab work, imaging, nerve conduction tests) Performing post-procedure evaluations, including interpretation of diagnostic procedures Obtaining and/or reviewing separately obtained history Performing a medically appropriate examination and/or evaluation Counseling and educating the patient/family/caregiver Ordering medications, tests, or procedures Referring and communicating with other health care professionals (when not separately reported) Documenting clinical information in the electronic or other health record Independently interpreting results (not separately reported) and communicating results to the patient/ family/caregiver Care coordination (not separately reported)  Note by: Kari DELENA Como, MD (TTS and AI technology used. I apologize for any typographical errors that were not detected and corrected.) Date: 03/02/2024; Time: 3:11 PM

## 2024-03-02 NOTE — Patient Instructions (Signed)

## 2024-03-08 ENCOUNTER — Ambulatory Visit: Attending: Nurse Practitioner | Admitting: Nurse Practitioner

## 2024-03-08 ENCOUNTER — Telehealth: Payer: Self-pay | Admitting: *Deleted

## 2024-03-08 ENCOUNTER — Encounter: Payer: Self-pay | Admitting: Nurse Practitioner

## 2024-03-08 VITALS — BP 135/79 | HR 98 | Temp 97.2°F | Ht 66.0 in | Wt 165.0 lb

## 2024-03-08 DIAGNOSIS — G8929 Other chronic pain: Secondary | ICD-10-CM | POA: Diagnosis not present

## 2024-03-08 DIAGNOSIS — Z79899 Other long term (current) drug therapy: Secondary | ICD-10-CM | POA: Diagnosis not present

## 2024-03-08 DIAGNOSIS — G894 Chronic pain syndrome: Secondary | ICD-10-CM | POA: Insufficient documentation

## 2024-03-08 DIAGNOSIS — Z79891 Long term (current) use of opiate analgesic: Secondary | ICD-10-CM | POA: Insufficient documentation

## 2024-03-08 DIAGNOSIS — R109 Unspecified abdominal pain: Secondary | ICD-10-CM | POA: Diagnosis not present

## 2024-03-08 MED ORDER — BUPRENORPHINE 10 MCG/HR TD PTWK: 1.0000 | MEDICATED_PATCH | TRANSDERMAL | 0 refills | Status: DC

## 2024-03-08 MED ORDER — KETOROLAC TROMETHAMINE 60 MG/2ML IM SOLN
60.0000 mg | Freq: Once | INTRAMUSCULAR | Status: AC
  Administered 2024-03-08: 60 mg via INTRAMUSCULAR
  Filled 2024-03-08: qty 2

## 2024-03-08 MED ORDER — METHOCARBAMOL 1000 MG/10ML IJ SOLN
200.0000 mg | Freq: Once | INTRAMUSCULAR | Status: AC
  Administered 2024-03-08: 200 mg via INTRAMUSCULAR
  Filled 2024-03-08: qty 10

## 2024-03-08 NOTE — Progress Notes (Signed)
 PROVIDER NOTE: Interpretation of information contained herein should be left to medically-trained personnel. Specific patient instructions are provided elsewhere under Patient Instructions section of medical record. This document was created in part using AI and STT-dictation technology, any transcriptional errors that may result from this process are unintentional.  Patient: Kari Gibbs  Service: E/M   PCP: Aisha Harvey, MD  DOB: Jun 10, 1951  DOS: 03/08/2024  Provider: Emmy MARLA Blanch, NP  MRN: [id number]  Delivery: Face-to-face  Specialty: Interventional Pain Management  Type: Established Patient  Setting: Ambulatory outpatient facility  Specialty designation: 09  Referring Prov.: Aisha Harvey, MD  Location: Outpatient office facility       History of present illness (HPI) [name], a 73 y.o. year old female, is here today because of her Chronic pain syndrome [G89.4]. [name] primary complain today is Abdominal Pain (left)  Pertinent problems: [name]  Chronic pain syndrome; Chronic flank pain (1ry area of pain) (left); Chronic thoracic radicular pain (Left-sided); Chronic occipital neuralgia (Left); Neuropathic pain; Neurogenic pain; History of rib fracture (4th and 8th rib) (Left); Chronic use of opiate; Trigger point with back pain (Left); Rib pain (Left); Chronic abdominal pain (2ry area of Pain) (Bilateral) (L>R); and Opioid induced constipation (OIC) on their pertinent problem list.   Pain Assessment: Severity of Chronic pain is reported as a 8 /10. Location: Abdomen Lower/travel to the left side. Onset: More than a month ago. Quality: Throbbing, Jabbing. Timing: Constant. Modifying factor(s): nothing. Vitals:  height is 5' 6 (1.676 m) and weight is 165 lb (74.8 kg). Her temperature is 97.2 F (36.2 C) (abnormal). Her blood pressure is 135/79 and her pulse is 98. Her oxygen saturation is 99%.  BMI: Estimated body mass index is 26.63 kg/m as calculated from the  following:   Height as of this encounter: 5' 6 (1.676 m).   Weight as of this encounter: 165 lb (74.8 kg).  Last encounter: 02/26/2024. Last procedure: Visit date not found.  Reason for encounter: medication management.  The patient was previously on morphine  IR 15 Mg, 1 tablet by mouth every 8 hours (90 tablets/month), total (45 mg/day or 45 MME/day), and was doing well on this regimen.  However, she was unable to obtain her medication due to a nationwide shortage of morphine .  The last time the patient was able to obtain a prescription for morphine  IR was on 01/22/2024, when she received a 90 tablet.  Due to a nationwide shortage of morphine , she was subsequently prescribed oxycodone /APAP 10-325 mg (#21), to be taken 3 times daily (equivalent to 45 MME per day).  However the patient reported experiencing nausea with the oxycodone , and noted that Zofran  did not alleviate her symptoms.  As she was unable to tolerate oxycodone , she was then prescribed a Butrans  5 mcg/hr patch along with a discussion of alternative options.  However, she reported that the lower dose of Butrans  was minimally effective and her pain persisted.  As a result, she was prescribed Butrans  7.5 mcg/h on 03/02/2024.  Shortly thereafter, the patient reporting that the medication was not working on today's visit and requesting higher dose of Butrans  patch and also Toradol  and Robaxin  IM injection for pain relief. We discuss to start on Butrans  patch 10 mcg/hr and Discard unused and used Butrans  patch 5 mcg/hr to appropriate disposal according to clinic protocol. We discuss to keep Butrans  patch 7.5 mcg/hr until she get her new prescription Butrans  10 mcg/hr patch from pharmacy. Advice to bring her used and unused  Butrans  7.5 mcg/h patch to the clinic at the next visit.   Pharmacotherapy Assessment   Butrans  (buprenorphine  patch) 7.5 mcg/h, 1 patch q. 7 days.  (Started on 03/02/2024).  Butrans  (buprenorphine  patch) 10 mcg/h, 1 patch q  28 days (Started on 03/08/2024) Monitoring: [place] PMP: PDMP reviewed during this encounter.       Pharmacotherapy: No side-effects or adverse reactions reported. Compliance: No problems identified. Effectiveness: Clinically acceptable.  Delores Dorothe LABOR, RN  03/08/2024  9:38 AM  Sign when Signing Visit Nursing Pain Medication Assessment:  Safety precautions to be maintained throughout the outpatient stay will include: orient to surroundings, keep bed in low position, maintain call bell within reach at all times, provide assistance with transfer out of bed and ambulation.  Medication Inspection Compliance: Pill count conducted under aseptic conditions, in front of the patient. Neither the pills nor the bottle was removed from the patient's sight at any time. Once count was completed pills were immediately returned to the patient in their original bottle.  Medication: Buprenorphine  patch (Butrans ) 5mcg Pill/Patch Count: 3 of 4 pills/patches remain Pill/Patch Appearance: Markings consistent with prescribed medication Bottle Appearance: Standard pharmacy container. Clearly labeled. Filled Date: 7 / 71 / 2025 Last Medication intake:  Ran out of medicine more than 48 hours agoNursing Pain Medication Assessment:   Medication: Buprenorphine  patch (Butrans )7.5 mcg Pill/Patch Count: 3 of 4 pills/patches remain Pill/Patch Appearance: Markings consistent with prescribed medication Bottle Appearance: Standard pharmacy container. Clearly labeled. Filled Date: 7 / 22 / 2025 Last Medication intake:  7/22/2025Safety precautions to be maintained throughout the outpatient stay will include: orient to surroundings, keep bed in low position, maintain call bell within reach at all times, provide assistance with transfer out of bed and ambulation.     UDS:  Summary  Date Value Ref Range Status  01/14/2024 FINAL  Final    Comment:    ==================================================================== [employer]  Select 13 (MW) ==================================================================== Test                             Result       Flag       Units  Drug Present and Declared for Prescription Verification   Morphine                        >6536        EXPECTED   ng/mg creat   Normorphine                    371          EXPECTED   ng/mg creat    Potential sources of large amounts of morphine  in the absence of    codeine  include administration of morphine  or use of heroin.     Normorphine is an expected metabolite of morphine .    Hydromorphone                   139          EXPECTED   ng/mg creat    Hydromorphone  may be present as a metabolite of morphine ;    concentrations of hydromorphone  rarely exceed 5% of the morphine     concentration when this is the source of hydromorphone .  ==================================================================== Test                      Result    Flag   Units  Ref Range   Creatinine              153              mg/dL      >=79 ==================================================================== Declared Medications:  The flagging and interpretation on this report are based on the  following declared medications.  Unexpected results may arise from  inaccuracies in the declared medications.   **Note: The testing scope of this panel includes these medications:   Morphine  (MSIR)   **Note: The testing scope of this panel does not include the  following reported medications:   Acetaminophen  (Tylenol )  Benzonatate  (Tessalon )  Biotin  Bupropion (Wellbutrin XL)  Buspirone (Buspar)  Cranberry  Denosumab  (Prolia )  Diclofenac  (Voltaren )  Estradiol (Estrace)  Eye Drops  Folic Acid  Irbesartan (Avapro)  Iron  Lansoprazole (Prevacid)  Loratadine (Claritin)  Magnesium  Montelukast  (Singulair )  Multivitamin  Naloxone  (Narcan )  Potassium  Probiotic  Supplement  Vitamin B12  Vitamin C  Vitamin  D3 ==================================================================== For clinical consultation, please call [phone]. ====================================================================     No results found for: CBDTHCR No results found for: D8THCCBX No results found for: D9THCCBX  ROS  Constitutional: Denies any fever or chills Gastrointestinal: No reported hemesis, hematochezia, vomiting, or acute GI distress Musculoskeletal: Denies any acute onset joint swelling, redness, loss of ROM, or weakness Neurological: No reported episodes of acute onset apraxia, aphasia, dysarthria, agnosia, amnesia, paralysis, loss of coordination, or loss of consciousness  Medication Review  Biotin, Cranberry, Iron-Vitamin C, Magnesium, Peppermint Oil, Polyethyl Glycol-Propyl Glycol, Prenatal MV & Min w/FA-DHA, RA Probiotic Gummies, Vitamin D3, acetaminophen , benzonatate , buPROPion, buprenorphine , busPIRone, denosumab , diclofenac  Sodium, estradiol, irbesartan, lansoprazole, loratadine, montelukast , ondansetron , potassium gluconate, predniSONE , and vitamin B-12  History Review  Allergy: [name] is allergic to ace inhibitors, losartan potassium, neosporin [neomycin-bacitracin zn-polymyx], and bacitracin-polymyxin b. Drug: [name]  reports no history of drug use. Alcohol:  reports current alcohol use. Tobacco:  reports that she has never smoked. She has never used smokeless tobacco. Social: [name]  reports that she has never smoked. She has never used smokeless tobacco. She reports current alcohol use. She reports that she does not use drugs. 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 Surgical: 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 Family: family history includes Alzheimer's disease in her father; Hypertension in her mother.  Laboratory Chemistry Profile   Renal Lab Results  Component Value Date   BUN 15 02/27/2024   CREATININE 0.97 02/27/2024   GFRAA >60 01/04/2016   GFRNONAA >60 02/27/2024    Hepatic Lab Results  Component Value Date   AST 28 02/27/2024   ALT 23 02/27/2024   ALBUMIN 4.5 02/27/2024   ALKPHOS 89 02/27/2024   AMYLASE  30 02/27/2010   LIPASE 22 10/12/2012    Electrolytes Lab Results  Component Value Date   NA 144 02/27/2024   K 3.2 (L) 02/27/2024   CL 107 02/27/2024   CALCIUM 9.6 02/27/2024   MG 2.1 01/04/2016    Bone Lab Results  Component Value Date   25OHVITD1 49 01/04/2016   25OHVITD2 25 01/04/2016   25OHVITD3 24 01/04/2016    Inflammation (CRP: Acute Phase) (ESR: Chronic Phase) Lab Results  Component Value Date   CRP <0.5 01/04/2016   ESRSEDRATE 17 01/04/2016         Note: Above Lab results reviewed.  Recent Imaging Review  DG PAIN CLINIC C-ARM 1-60 MIN NO REPORT Fluoro was used, but no Radiologist interpretation will be provided.  Please refer to NOTES tab  for provider progress note. Note: Reviewed        Physical Exam  Vitals: BP 135/79   Pulse 98   Temp (!) 97.2 F (36.2 C)   Ht 5' 6 (1.676 m)   Wt 165 lb (74.8 kg)   SpO2 99%   BMI 26.63 kg/m  BMI: Estimated body mass index is 26.63 kg/m as calculated from the following:   Height as of this encounter: 5' 6 (1.676 m).   Weight as of this encounter: 165 lb (74.8 kg). Ideal: Ideal body weight: 59.3 kg (130 lb 11.7 oz) Adjusted ideal body weight: 65.5 kg (144 lb 7 oz) General appearance: Well nourished, well developed, and well hydrated. In no apparent acute distress Mental status: Alert, oriented x 3 (person, place, & time)       Respiratory: No evidence of acute respiratory distress Eyes: PERLA   Assessment   Diagnosis Status  1. Chronic pain syndrome   2. Chronic flank pain (1ry area of Pain) (Left)   3. Encounter for chronic pain management   4. Long term prescription opiate use   5. Chronic use of opiate for therapeutic purpose   6. Medication management    Controlled Controlled Controlled   Updated Problems: No problems updated.  Plan of Care  Problem-specific:  Assessment and Plan Prescribing drug monitoring (PDMP) reviewed; findings consistent with the morphine  (Prescribed medication).    Started on Butrans  patch 10 mcg/hr, 1 patch onto the skin once a week for 28 days.   Schedule follow-up in 1 month for medication evaluation.   Ms. Angeligue Bowne has a current medication list which includes the following long-term medication(s): buprenorphine , bupropion, diclofenac  sodium, and lansoprazole.  Pharmacotherapy (Medications Ordered): Meds ordered this encounter  Medications   buprenorphine  (BUTRANS ) 10 MCG/HR PTWK    Sig: Place 1 patch onto the skin once a week for 28 days.    Dispense:  4 patch    Refill:  0    Chronic Pain: STOP Act (Not applicable) Fill 1 day early if closed on refill date. Avoid benzodiazepines within 8 hours of opioids   ketorolac   (TORADOL ) injection 60 mg   methocarbamol  (ROBAXIN ) injection 200 mg   Orders:  No orders of the defined types were placed in this encounter.       Return in about 1 month (around 04/08/2024) for (F2F), (MM), Emmy Blanch NP.    Recent Visits Date Type Provider Dept  03/02/24 Office Visit Tanya Glisson, MD [facility]  02/26/24 Office Visit Lincoln Ginley K, NP [facility]  02/23/24 Office Visit Lucca Ballo K, NP [facility]  01/14/24 Office Visit Nahun Kronberg K, NP [facility]  Showing recent visits within past 90 days and meeting all other requirements Today's Visits Date Type Provider Dept  03/08/24 Office Visit Cline Draheim K, NP [facility]  Showing today's visits and meeting all other requirements Future Appointments Date Type Provider Dept  03/18/24 Appointment Dameer Speiser K, NP [facility]  Showing future appointments within next 90 days and meeting all other requirements  I discussed the assessment and treatment plan with the patient. The patient was provided an opportunity to ask questions and all were answered. The patient agreed with the plan and demonstrated an understanding of the instructions.  Patient advised to call back or seek an in-person evaluation if the symptoms or condition worsens.  Duration of encounter: 30 minutes.  Total time on encounter, as per AMA guidelines included both the face-to-face and non-face-to-face time personally spent by the physician and/or other qualified health care professional(s) on the day of the encounter (includes time in activities that require the physician or other qualified health care professional and does not include time in activities normally performed by clinical staff). Physician's time may include the following activities when performed: Preparing to see the patient (e.g., pre-charting review of records, searching for previously ordered imaging, lab work, and  nerve conduction tests) Review of prior analgesic pharmacotherapies. Reviewing PMP Interpreting ordered tests (e.g., lab work, imaging, nerve conduction tests) Performing post-procedure evaluations, including interpretation of diagnostic procedures Obtaining and/or reviewing separately obtained history Performing a medically appropriate examination and/or evaluation Counseling and educating the patient/family/caregiver Ordering medications, tests, or procedures Referring and communicating with other health care professionals (when not separately reported) Documenting clinical information in the electronic or other health record Independently interpreting results (not separately reported) and communicating results to the patient/ family/caregiver Care coordination (not separately reported)  Note by: Tiana Sivertson K Jeter Tomey, NP (TTS and AI technology used. I apologize for any typographical errors that were not detected and corrected.) Date: 03/08/2024; Time: 10:04 AM

## 2024-03-08 NOTE — Progress Notes (Signed)
 Nursing Pain Medication Assessment:  Safety precautions to be maintained throughout the outpatient stay will include: orient to surroundings, keep bed in low position, maintain call bell within reach at all times, provide assistance with transfer out of bed and ambulation.  Medication Inspection Compliance: Pill count conducted under aseptic conditions, in front of the patient. Neither the pills nor the bottle was removed from the patient's sight at any time. Once count was completed pills were immediately returned to the patient in their original bottle.  Medication: Buprenorphine  patch (Butrans ) 5mcg Pill/Patch Count: 3 of 4 pills/patches remain Pill/Patch Appearance: Markings consistent with prescribed medication Bottle Appearance: Standard pharmacy container. Clearly labeled. Filled Date: 7 / 49 / 2025 Last Medication intake:  Ran out of medicine more than 48 hours agoNursing Pain Medication Assessment:   Medication: Buprenorphine  patch (Butrans )7.5 mcg Pill/Patch Count: 3 of 4 pills/patches remain Pill/Patch Appearance: Markings consistent with prescribed medication Bottle Appearance: Standard pharmacy container. Clearly labeled. Filled Date: 7 / 22 / 2025 Last Medication intake:  7/22/2025Safety precautions to be maintained throughout the outpatient stay will include: orient to surroundings, keep bed in low position, maintain call bell within reach at all times, provide assistance with transfer out of bed and ambulation.   1045 Buprenorphine  patches 5.0 mcg #3 wasted in bin in pharmacy with patient and husband.

## 2024-03-14 ENCOUNTER — Encounter: Payer: Self-pay | Admitting: Nurse Practitioner

## 2024-03-15 ENCOUNTER — Telehealth: Payer: Self-pay

## 2024-03-15 NOTE — Telephone Encounter (Signed)
 [facility] [address] [place] [address] has her morphine , they have 90, if it can be called out to there.

## 2024-03-16 ENCOUNTER — Telehealth: Payer: Self-pay

## 2024-03-16 ENCOUNTER — Telehealth: Payer: Self-pay | Admitting: Nurse Practitioner

## 2024-03-16 ENCOUNTER — Encounter: Payer: Self-pay | Admitting: Nurse Practitioner

## 2024-03-16 ENCOUNTER — Other Ambulatory Visit: Payer: Self-pay | Admitting: Nurse Practitioner

## 2024-03-16 ENCOUNTER — Ambulatory Visit: Attending: Nurse Practitioner | Admitting: Nurse Practitioner

## 2024-03-16 VITALS — BP 131/65 | HR 81 | Temp 97.1°F | Ht 66.0 in | Wt 163.0 lb

## 2024-03-16 DIAGNOSIS — R109 Unspecified abdominal pain: Secondary | ICD-10-CM | POA: Insufficient documentation

## 2024-03-16 DIAGNOSIS — G894 Chronic pain syndrome: Secondary | ICD-10-CM | POA: Diagnosis not present

## 2024-03-16 DIAGNOSIS — Z79891 Long term (current) use of opiate analgesic: Secondary | ICD-10-CM | POA: Insufficient documentation

## 2024-03-16 DIAGNOSIS — R1012 Left upper quadrant pain: Secondary | ICD-10-CM | POA: Insufficient documentation

## 2024-03-16 DIAGNOSIS — Z79899 Other long term (current) drug therapy: Secondary | ICD-10-CM | POA: Diagnosis not present

## 2024-03-16 DIAGNOSIS — R1011 Right upper quadrant pain: Secondary | ICD-10-CM | POA: Diagnosis not present

## 2024-03-16 DIAGNOSIS — M545 Low back pain, unspecified: Secondary | ICD-10-CM | POA: Insufficient documentation

## 2024-03-16 DIAGNOSIS — G8929 Other chronic pain: Secondary | ICD-10-CM | POA: Diagnosis not present

## 2024-03-16 MED ORDER — MORPHINE SULFATE 15 MG PO TABS: 15.0000 mg | ORAL_TABLET | Freq: Four times a day (QID) | ORAL | 0 refills | Status: DC | PRN

## 2024-03-16 MED ORDER — MORPHINE SULFATE ER 15 MG PO TBCR: 15.0000 mg | EXTENDED_RELEASE_TABLET | Freq: Three times a day (TID) | ORAL | 0 refills | Status: DC

## 2024-03-16 NOTE — Progress Notes (Signed)
 PROVIDER NOTE: Interpretation of information contained herein should be left to medically-trained personnel. Specific patient instructions are provided elsewhere under Patient Instructions section of medical record. This document was created in part using AI and STT-dictation technology, any transcriptional errors that may result from this process are unintentional.  Patient: Kari Gibbs  Service: E/M   PCP: Aisha Harvey, MD  DOB: [birth year]  DOS: 03/16/2024  Provider: Emmy MARLA Blanch, NP  MRN: [id number]  Delivery: Face-to-face  Specialty: Interventional Pain Management  Type: Established Patient  Setting: Ambulatory outpatient facility  Specialty designation: 09  Referring Prov.: Aisha Harvey, MD  Location: Outpatient office facility       History of present illness (HPI) [name], a 73 y.o. year old female, is here today because of her Chronic pain syndrome [G89.4]. [name] primary complain today is Bone Pain (Left rib cage)  Pertinent problems: [name] does not have any pertinent problems on file.  Pain Assessment: Severity of Chronic pain is reported as a 8 /10. Location: Rib cage Left/radiates to left mid back. Onset: More than a month ago. Quality: Constant, Throbbing (electric jolt). Timing: Constant. Modifying factor(s): nothing. Vitals:  height is 5' 6 (1.676 m) and weight is 163 lb (73.9 kg). Her temperature is 97.1 F (36.2 C) (abnormal). Her blood pressure is 131/65 and her pulse is 81. Her oxygen saturation is 100%.  BMI: Estimated body mass index is 26.31 kg/m as calculated from the following:   Height as of this encounter: 5' 6 (1.676 m).   Weight as of this encounter: 163 lb (73.9 kg).  Last encounter: 03/08/2024. Last procedure: Visit date not found.  Reason for encounter: medication management.  The patient was previously on morphine  IR 15 Mg, 1 tablet by mouth every 8 hours (90 tablets/month)1 tablet by mouth every 8 hours (90 tablets/month),  total (45 mg/day or 45 MME/day), and was doing well on this regimen.  However, she was unable to obtain her medication due to a nationwide shortage of morphine .  The last time the patient was able to obtain a prescription for morphine  IR was on 01/22/2024, when she received a 90 tablet.  Due to a nationwide shortage of morphine , she was subsequently prescribed oxycodone /APAP 10-325 mg (#21), to be taken 3 times daily (equivalent to 45 MME per day).  However the patient reported experiencing nausea with the oxycodone , and noted that Zofran  did not alleviate her symptoms.   As she was unable to tolerate oxycodone , she was then prescribed a Butrans  5 mcg/hr patch along with a discussion of alternative options.  However, she reported that the lower dose of Butrans  was minimally effective and her pain persisted.  As a result, she was prescribed Butrans  7.5 mcg/h on 03/02/2024.  Shortly thereafter, the patient reporting that the medication was not working on today's visit and requesting higher dose of Butrans  patch and also Toradol  and Robaxin  IM injection for pain relief. We discuss to start on Butrans  patch 10 mcg/hr and Discard unused and used Butrans  patch 5 mcg/hr to appropriate disposal according to clinic protocol. We discuss to keep Butrans  patch 7.5 mcg/hr until she get her new prescription Butrans  10 mcg/hr patch from pharmacy. Advice to bring her used and unused Butrans  7.5 mcg/h patch to the clinic at the next visit.   The patient brought both Butrans  patches to the clinic.  The nurses counter both patches and discard the 7.5 mcg patch per protocol.  Patient will continue using the Butrans  10 mcg  patch until she fills her morphine  prescription.  According to the patient, she confirmed with her pharmacy that they have morphine  sulfate 1515 Mg available with the quantity of 90 tablets.  I have sent the morphine  prescription to her updated pharmacy for 30 days, with a quantity of 90  tablets.  Pharmacotherapy Assessment   Butrans  (buprenorphine  patch) 7.5 mcg/h, 1 patch q. 7 days.  (Started on 03/02/2024). Discontinue  Butrans  (buprenorphine  patch) 10 mcg/h, 1 patch q 28 days (Started on 03/08/2024)  Morphine  (MS IR) 15 mg tablet every 8 hours as needed for pain. (Started on 03/16/2024) Monitoring: [place] PMP: PDMP reviewed during this encounter.       Pharmacotherapy: No side-effects or adverse reactions reported. Compliance: No problems identified. Effectiveness: Clinically acceptable.  Margrette Nathanel PARAS, RN  03/16/2024  9:42 AM  Sign when Signing Visit Safety precautions to be maintained throughout the outpatient stay will include: orient to surroundings, keep bed in low position, maintain call bell within reach at all times, provide assistance with transfer out of bed and ambulation.   Nursing Pain Medication Assessment:  Safety precautions to be maintained throughout the outpatient stay will include: orient to surroundings, keep bed in low position, maintain call bell within reach at all times, provide assistance with transfer out of bed and ambulation.  Medication Inspection Compliance: Pill count conducted under aseptic conditions, in front of the patient. Neither the pills nor the bottle was removed from the patient's sight at any time. Once count was completed pills were immediately returned to the patient in their original bottle.  Medication #1: Buprenorphine  (Suboxone) 10mcg Pill/Patch Count: 3 of 4 pills/patches remain Pill/Patch Appearance: Markings consistent with prescribed medication Bottle Appearance: Standard pharmacy container. Clearly labeled. Filled Date: 7 / 28 / 2025 Last Medication intake:  03/08/24  Medication #2: Buprenorphine  (Suboxone) 7.5mcg Pill/Patch Count: 3 of 4 pills/patches remain Pill/Patch Appearance: Markings consistent with prescribed medication Bottle Appearance: Standard pharmacy container. Clearly labeled. Filled Date: 7 / 22 /  2025 Last Medication intake:  03/02/24  Buprenorphine  7.5 mcg discarded into green bin at employee pharmacy with patient's husband and myself.     UDS:  Summary  Date Value Ref Range Status  01/14/2024 FINAL  Final    Comment:    ==================================================================== [employer] (MW) ==================================================================== Test                             Result       Flag       Units  Drug Present and Declared for Prescription Verification   Morphine                        >6536        EXPECTED   ng/mg creat   Normorphine                    371          EXPECTED   ng/mg creat    Potential sources of large amounts of morphine  in the absence of    codeine  include administration of morphine  or use of heroin.     Normorphine is an expected metabolite of morphine .    Hydromorphone                   139          EXPECTED   ng/mg creat    Hydromorphone   may be present as a metabolite of morphine ;    concentrations of hydromorphone  rarely exceed 5% of the morphine     concentration when this is the source of hydromorphone .  ==================================================================== Test                      Result    Flag   Units      Ref Range   Creatinine              153              mg/dL      >=79 ==================================================================== Declared Medications:  The flagging and interpretation on this report are based on the  following declared medications.  Unexpected results may arise from  inaccuracies in the declared medications.   **Note: The testing scope of this panel includes these medications:   Morphine  (MSIR)   **Note: The testing scope of this panel does not include the  following reported medications:   Acetaminophen  (Tylenol )  Benzonatate  (Tessalon )  Biotin  Bupropion (Wellbutrin XL)  Buspirone (Buspar)  Cranberry  Denosumab  (Prolia )  Diclofenac   (Voltaren )  Estradiol (Estrace)  Eye Drops  Folic Acid  Irbesartan (Avapro)  Iron  Lansoprazole (Prevacid)  Loratadine (Claritin)  Magnesium  Montelukast  (Singulair )  Multivitamin  Naloxone  (Narcan )  Potassium  Probiotic  Supplement  Vitamin B12  Vitamin C  Vitamin D3 ==================================================================== For clinical consultation, please call [phone]. ====================================================================     No results found for: CBDTHCR No results found for: D8THCCBX No results found for: D9THCCBX  ROS  Constitutional: Denies any fever or chills Gastrointestinal: No reported hemesis, hematochezia, vomiting, or acute GI distress Musculoskeletal: Denies any acute onset joint swelling, redness, loss of ROM, or weakness Neurological: No reported episodes of acute onset apraxia, aphasia, dysarthria, agnosia, amnesia, paralysis, loss of coordination, or loss of consciousness  Medication Review  Biotin, Cranberry, Iron-Vitamin C, Magnesium, Peppermint Oil, Polyethyl Glycol-Propyl Glycol, Prenatal MV & Min w/FA-DHA, RA Probiotic Gummies, Vitamin D3, acetaminophen , benzonatate , buPROPion, buprenorphine , busPIRone, denosumab , diclofenac  Sodium, estradiol, irbesartan, lansoprazole, loratadine, montelukast , morphine , ondansetron , potassium gluconate, and vitamin B-12  History Review  Allergy: [name] is allergic to ace inhibitors, losartan potassium, neosporin [neomycin-bacitracin zn-polymyx], and bacitracin-polymyxin b. Drug: [name]  reports no history of drug use. Alcohol:  reports current alcohol use. Tobacco:  reports that she has never smoked. She has never used smokeless tobacco. Social: [name]  reports that she has never smoked. She has never used smokeless tobacco. She reports current alcohol use. She reports that she does not use drugs. 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 Surgical: 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 Family: family history includes Alzheimer's disease in her father; Hypertension in  her mother.  Laboratory Chemistry Profile   Renal Lab Results  Component Value Date   BUN 15 02/27/2024   CREATININE 0.97 02/27/2024   GFRAA >60 01/04/2016   GFRNONAA >60 02/27/2024    Hepatic Lab Results  Component Value Date   AST 28 02/27/2024   ALT 23 02/27/2024   ALBUMIN 4.5 02/27/2024   ALKPHOS 89 02/27/2024   AMYLASE 30 02/27/2010   LIPASE 22 10/12/2012    Electrolytes Lab Results  Component Value Date   NA 144 02/27/2024   K 3.2 (L) 02/27/2024   CL 107 02/27/2024   CALCIUM 9.6 02/27/2024   MG 2.1 01/04/2016    Bone Lab Results  Component Value Date   25OHVITD1 49 01/04/2016   25OHVITD2 25 01/04/2016   25OHVITD3 24 01/04/2016    Inflammation (CRP: Acute Phase) (ESR: Chronic Phase) Lab Results   Component Value Date   CRP <0.5 01/04/2016   ESRSEDRATE 17 01/04/2016         Note: Above Lab results reviewed.  Recent Imaging Review  DG PAIN CLINIC C-ARM 1-60 MIN NO REPORT Fluoro was used, but no Radiologist interpretation will be provided.  Please refer to NOTES tab for provider progress note. Note: Reviewed        Physical Exam  Vitals: BP 131/65   Pulse 81   Temp (!) 97.1 F (36.2 C)   Ht 5' 6 (1.676 m)   Wt 163 lb (73.9 kg)   SpO2 100%   BMI 26.31 kg/m  BMI: Estimated body mass index is 26.31 kg/m as calculated from the following:   Height as of this encounter: 5' 6 (1.676 m).   Weight as of this encounter: 163 lb (73.9 kg). Ideal: Ideal body weight: 59.3 kg (130 lb 11.7 oz) Adjusted ideal body weight: 65.2 kg (143 lb 10.2 oz) General appearance: Well nourished, well developed, and well hydrated. In no apparent acute distress Mental status: Alert, oriented x 3 (person, place, & time)       Respiratory: No evidence of acute respiratory distress Eyes: PERLA   Assessment   Diagnosis Status  1. Chronic pain syndrome   2. Chronic flank pain (1ry area of Pain) (Left)   3. Encounter for chronic pain management   4. Long term prescription opiate use   5. Chronic use of opiate for therapeutic purpose   6. Medication management   7. Chronic abdominal pain (2ry area of Pain) (Bilateral) (L>R)   8. Chronic low back pain (3ry area of Pain) (Bilateral) w/o sciatica    Controlled Controlled Controlled   Updated Problems: No problems updated.  Plan of Care  Problem-specific:  Assessment and Plan Prescribing drug monitoring (PDMP) reviewed; findings consistent with the morphine  (Prescribed medication).   Started on Morphine  (MS IR) 15 mg tablet every 8 hours as needed for pain. (Started on 03/16/2024) Continue with Butrans  10 mcg patch until she fill up her Morphine   Advised patient to bring her unused and used Butrans  10 mcg patch at the next visit. Keep  scheduled appointment with Emmy Blanch, NP.  Ms. Kennedi Lizardo has a current medication list which includes the following long-term medication(s): buprenorphine , bupropion, diclofenac  sodium, and lansoprazole.  Pharmacotherapy (Medications Ordered): Meds ordered this encounter  Medications   morphine  (MS CONTIN ) 15 MG 12 hr tablet    Sig: Take 1 tablet (15 mg total) by mouth in the morning, at noon, and at bedtime. Must last 30 days. Do not break tablet    Dispense:  90 tablet    Refill:  0    Chronic Pain: STOP Act (Not applicable) Fill 1 day early if closed on refill date. Avoid benzodiazepines within 8 hours of opioids   Orders:  No orders of the defined types were placed in this encounter.       No follow-ups on file.    Recent Visits Date Type Provider Dept  03/08/24 Office Visit Yuji Walth K, NP [facility]  03/02/24 Office Visit Tanya Glisson, MD [facility]  02/26/24 Office Visit Aurilla Coulibaly K, NP [facility]  02/23/24 Office Visit Alona Danford K, NP [facility]  01/14/24 Office Visit Vonetta Foulk K, NP [facility]  Showing recent visits within past 90 days and meeting all other requirements Today's Visits Date Type Provider Dept  03/16/24 Office Visit Bentzion Dauria K, NP [facility]  Showing today's visits and meeting all other requirements Future Appointments Date Type Provider Dept  04/06/24 Appointment Shamon Lobo K, NP [facility]  Showing future appointments within next 90 days and meeting all other requirements  I discussed the assessment and treatment plan with the patient. The patient was provided an opportunity to ask questions and all were answered. The patient agreed with the plan and demonstrated an understanding of the instructions.  Patient advised to call back or seek an in-person evaluation if the symptoms or condition worsens.  Duration of encounter: 30 minutes.  Total  time on encounter, as per AMA guidelines included both the face-to-face and non-face-to-face time personally spent by the physician and/or other qualified health care professional(s) on the day of the encounter (includes time in activities that require the physician or other qualified health care professional and does not include time in activities normally performed by clinical staff). Physician's time may include the following activities when performed: Preparing to see the patient (e.g., pre-charting review of records, searching for previously ordered imaging, lab work, and nerve conduction tests) Review of prior analgesic pharmacotherapies. Reviewing PMP Interpreting ordered tests (e.g., lab work, imaging, nerve conduction tests) Performing post-procedure evaluations, including interpretation of diagnostic procedures Obtaining and/or reviewing separately obtained history Performing a medically appropriate examination and/or evaluation Counseling and educating the patient/family/caregiver Ordering medications, tests, or procedures Referring and communicating with other health care professionals (when not separately reported) Documenting clinical information in the electronic or other health record Independently interpreting results (not separately reported) and communicating results to the patient/ family/caregiver Care coordination (not separately reported)  Note by: Aryiah Monterosso K Maurine Mowbray, NP (TTS and AI technology used. I apologize for any typographical errors that were not detected and corrected.) Date: 03/16/2024; Time: 11:35 AM

## 2024-03-16 NOTE — Telephone Encounter (Signed)
 Patient went to pick up script, it is written wrong Supposed to be  Morphine  Sulfate IR 15mg  Please resend to the pharmacy they requested [facility] [address] [place], [place]

## 2024-03-16 NOTE — Telephone Encounter (Signed)
 She said Kari Gibbs put in the wrong medicine today. It is supposed to be morphine  immediate release not extended release. She states she cannot take extended. She doesn't understand why this is happening because she has always gotten immediate release and she needs it today. Please call and let her know when it is fixed.

## 2024-03-16 NOTE — Progress Notes (Signed)
 Safety precautions to be maintained throughout the outpatient stay will include: orient to surroundings, keep bed in low position, maintain call bell within reach at all times, provide assistance with transfer out of bed and ambulation.   Nursing Pain Medication Assessment:  Safety precautions to be maintained throughout the outpatient stay will include: orient to surroundings, keep bed in low position, maintain call bell within reach at all times, provide assistance with transfer out of bed and ambulation.  Medication Inspection Compliance: Pill count conducted under aseptic conditions, in front of the patient. Neither the pills nor the bottle was removed from the patient's sight at any time. Once count was completed pills were immediately returned to the patient in their original bottle.  Medication #1: Buprenorphine  (Suboxone) 10mcg Pill/Patch Count: 3 of 4 pills/patches remain Pill/Patch Appearance: Markings consistent with prescribed medication Bottle Appearance: Standard pharmacy container. Clearly labeled. Filled Date: 7 / 28 / 2025 Last Medication intake:  03/08/24  Medication #2: Buprenorphine  (Suboxone) 7.5mcg Pill/Patch Count: 3 of 4 pills/patches remain Pill/Patch Appearance: Markings consistent with prescribed medication Bottle Appearance: Standard pharmacy container. Clearly labeled. Filled Date: 7 / 22 / 2025 Last Medication intake:  03/02/24  Buprenorphine  7.5 mcg discarded into green bin at employee pharmacy with patient's husband and myself.

## 2024-03-17 ENCOUNTER — Other Ambulatory Visit: Payer: Self-pay | Admitting: *Deleted

## 2024-03-17 MED ORDER — MORPHINE SULFATE 15 MG PO TABS: 15.0000 mg | ORAL_TABLET | Freq: Four times a day (QID) | ORAL | 0 refills | Status: DC | PRN

## 2024-03-18 ENCOUNTER — Encounter: Admitting: Nurse Practitioner

## 2024-03-24 DIAGNOSIS — N200 Calculus of kidney: Secondary | ICD-10-CM | POA: Diagnosis not present

## 2024-03-24 DIAGNOSIS — N3020 Other chronic cystitis without hematuria: Secondary | ICD-10-CM | POA: Diagnosis not present

## 2024-03-29 ENCOUNTER — Other Ambulatory Visit (HOSPITAL_BASED_OUTPATIENT_CLINIC_OR_DEPARTMENT_OTHER)

## 2024-03-29 ENCOUNTER — Other Ambulatory Visit: Payer: Medicare Other

## 2024-04-06 ENCOUNTER — Ambulatory Visit: Attending: Nurse Practitioner | Admitting: Nurse Practitioner

## 2024-04-06 ENCOUNTER — Encounter: Payer: Self-pay | Admitting: Nurse Practitioner

## 2024-04-06 VITALS — BP 122/64 | HR 91 | Temp 98.1°F | Resp 16 | Ht 66.0 in | Wt 163.0 lb

## 2024-04-06 DIAGNOSIS — G8929 Other chronic pain: Secondary | ICD-10-CM | POA: Insufficient documentation

## 2024-04-06 DIAGNOSIS — Z79899 Other long term (current) drug therapy: Secondary | ICD-10-CM | POA: Diagnosis not present

## 2024-04-06 DIAGNOSIS — Z79891 Long term (current) use of opiate analgesic: Secondary | ICD-10-CM | POA: Insufficient documentation

## 2024-04-06 DIAGNOSIS — G894 Chronic pain syndrome: Secondary | ICD-10-CM | POA: Diagnosis not present

## 2024-04-06 DIAGNOSIS — R1011 Right upper quadrant pain: Secondary | ICD-10-CM | POA: Diagnosis not present

## 2024-04-06 DIAGNOSIS — M545 Low back pain, unspecified: Secondary | ICD-10-CM | POA: Insufficient documentation

## 2024-04-06 DIAGNOSIS — R109 Unspecified abdominal pain: Secondary | ICD-10-CM | POA: Insufficient documentation

## 2024-04-06 DIAGNOSIS — R1012 Left upper quadrant pain: Secondary | ICD-10-CM | POA: Insufficient documentation

## 2024-04-06 MED ORDER — MORPHINE SULFATE 15 MG PO TABS: 15.0000 mg | ORAL_TABLET | Freq: Three times a day (TID) | ORAL | 0 refills | Status: AC

## 2024-04-06 MED ORDER — MORPHINE SULFATE 15 MG PO TABS: 15.0000 mg | ORAL_TABLET | Freq: Three times a day (TID) | ORAL | 0 refills | Status: DC

## 2024-04-06 MED ORDER — MORPHINE SULFATE 15 MG PO TABS: 15.0000 mg | ORAL_TABLET | Freq: Four times a day (QID) | ORAL | 0 refills | Status: DC | PRN

## 2024-04-06 NOTE — Progress Notes (Signed)
 PROVIDER NOTE: Interpretation of information contained herein should be left to medically-trained personnel. Specific patient instructions are provided elsewhere under Patient Instructions section of medical record. This document was created in part using AI and STT-dictation technology, any transcriptional errors that may result from this process are unintentional.  Patient: Kari Gibbs  Service: E/M   PCP: Aisha Harvey, MD  DOB: 08-23-1950  DOS: 04/06/2024  Provider: Emmy MARLA Blanch, NP  MRN: [id number]  Delivery: Face-to-face  Specialty: Interventional Pain Management  Type: Established Patient  Setting: Ambulatory outpatient facility  Specialty designation: 09  Referring Prov.: Aisha Harvey, MD  Location: Outpatient office facility       History of present illness (HPI) Kari Gibbs, a 73 y.o. year old female, is here today because of her Chronic pain syndrome [G89.4]. Ms. Hey primary complain today is Pain (intercostal)  Pertinent problems: Kari Gibbs does not have any pertinent problems on file.  Pain Assessment: Severity of Chronic pain is reported as a 6 /10. Location: Other (Comment) (intercostal) Left/to left mid back. Onset: More than a month ago. Quality: Constant, Throbbing. Timing: Constant. Modifying factor(s): morphine . Vitals:  height is 5' 6 (1.676 m) and weight is 163 lb (73.9 kg). Her temperature is 98.1 F (36.7 C). Her blood pressure is 122/64 and her pulse is 91. Her respiration is 16 and oxygen saturation is 100%.  BMI: Estimated body mass index is 26.31 kg/m as calculated from the following:   Height as of this encounter: 5' 6 (1.676 m).   Weight as of this encounter: 163 lb (73.9 kg).  Last encounter: 03/16/2024. Last procedure: Visit date not found.  Reason for encounter: medication management. The patient was previously on morphine  IR 15 Mg, 1 tablet by mouth every 8 hours (90 tablets/month)1 tablet by mouth every 8 hours (90 tablets/month),  total (45 mg/day or 45 MME/day), and was doing well on this regimen. According to the patient, she confirmed with her pharmacy that they have morphine  sulfate 15 Mg available with the quantity of 270 tablets for up to 90 days.   Discard Butrans  10 mcg patch as per protocol at the clinic   Pharmacotherapy Assessment   Butrans  (buprenorphine  patch) 7.5 mcg/h, 1 patch q. 7 days.  (Started on 03/02/2024). Discontinue  Butrans  (buprenorphine  patch) 10 mcg/h, 1 patch q 28 days (Started on 03/08/2024)  Discontinue on 04/06/2024 Morphine  (MS IR) 15 mg tablet every 8 hours as needed for pain. (Started on 03/16/2024) MME=60 Monitoring: [place] PMP: PDMP reviewed during this encounter.       Pharmacotherapy: No side-effects or adverse reactions reported. Compliance: No problems identified. Effectiveness: Clinically acceptable.  Kari Gibbs, [place]  04/06/2024 10:46 AM  Sign when Signing Visit Butras Patches 2 of 4 patches discarded at Outpatient Pharmacy per protocol, with patient and husband.      Kari Pulling, RN  04/06/2024  9:35 AM  Sign when Signing Visit Nursing Pain Medication Assessment:  Safety precautions to be maintained throughout the outpatient stay will include: orient to surroundings, keep bed in low position, maintain call bell within reach at all times, provide assistance with transfer out of bed and ambulation.  Medication Inspection Compliance: Pill count conducted under aseptic conditions, in front of the patient. Neither the pills nor the bottle was removed from the patient's sight at any time. Once count was completed pills were immediately returned to the patient in their original bottle.  Medication #1: Buprenorphine  patch (Butrans ) Pill/Patch Count: 2 of 4 pills/patches remain Pill/Patch  Appearance: Markings consistent with prescribed medication Bottle Appearance: Standard pharmacy container. Clearly labeled. Filled Date: 7 / 65 / 2025 Last Medication intake:   03/16/24  Medication #2: Morphine  IR Pill/Patch Count: 1/120 Pill/Patch Appearance: Markings consistent with prescribed medication Bottle Appearance: Standard pharmacy container. Clearly labeled. Filled Date: 8 / 6 / 2025 Last Medication intake:  Today    UDS:  Summary  Date Value Ref Range Status  01/14/2024 FINAL  Final    Comment:    ==================================================================== [employer] Select 13 (MW) ==================================================================== Test                             Result       Flag       Units  Drug Present and Declared for Prescription Verification   Morphine                        >6536        EXPECTED   ng/mg creat   Normorphine                    371          EXPECTED   ng/mg creat    Potential sources of large amounts of morphine  in the absence of    codeine  include administration of morphine  or use of heroin.     Normorphine is an expected metabolite of morphine .    Hydromorphone                   139          EXPECTED   ng/mg creat    Hydromorphone  may be present as a metabolite of morphine ;    concentrations of hydromorphone  rarely exceed 5% of the morphine     concentration when this is the source of hydromorphone .  ==================================================================== Test                      Result    Flag   Units      Ref Range   Creatinine              153              mg/dL      >=79 ==================================================================== Declared Medications:  The flagging and interpretation on this report are based on the  following declared medications.  Unexpected results may arise from  inaccuracies in the declared medications.   **Note: The testing scope of this panel includes these medications:   Morphine  (MSIR)   **Note: The testing scope of this panel does not include the  following reported medications:   Acetaminophen  (Tylenol )  Benzonatate  (Tessalon )   Biotin  Bupropion (Wellbutrin XL)  Buspirone (Buspar)  Cranberry  Denosumab  (Prolia )  Diclofenac  (Voltaren )  Estradiol (Estrace)  Eye Drops  Folic Acid  Irbesartan (Avapro)  Iron  Lansoprazole (Prevacid)  Loratadine (Claritin)  Magnesium  Montelukast  (Singulair )  Multivitamin  Naloxone  (Narcan )  Potassium  Probiotic  Supplement  Vitamin B12  Vitamin C  Vitamin D3 ==================================================================== For clinical consultation, please call [phone]. ====================================================================     No results found for: CBDTHCR No results found for: D8THCCBX No results found for: D9THCCBX  ROS  Constitutional: Denies any fever or chills Gastrointestinal: No reported hemesis, hematochezia, vomiting, or acute GI distress Musculoskeletal: Denies any acute onset joint swelling, redness, loss of ROM, or weakness Neurological: No reported episodes of  acute onset apraxia, aphasia, dysarthria, agnosia, amnesia, paralysis, loss of coordination, or loss of consciousness  Medication Review  Biotin, Cranberry, Iron-Vitamin C, Magnesium, Peppermint Oil, Polyethyl Glycol-Propyl Glycol, Prenatal MV & Min w/FA-DHA, RA Probiotic Gummies, Vitamin D3, acetaminophen , benzonatate , buPROPion, busPIRone, denosumab , diclofenac  Sodium, estradiol, irbesartan, lansoprazole, loratadine, montelukast , morphine , ondansetron , potassium gluconate, and vitamin B-12  History Review  Allergy: Ms. Daughenbaugh is allergic to ace inhibitors, losartan potassium, neosporin [neomycin-bacitracin zn-polymyx], and bacitracin-polymyxin b. Drug: Ms. Pinch  reports no history of drug use. Alcohol:  reports current alcohol use. Tobacco:  reports that she has never smoked. She has never used smokeless tobacco. Social: Ms. Brame  reports that she has never smoked. She has never used smokeless tobacco. She reports current alcohol use. She reports that  she does not use drugs. 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 Surgical: Ms. 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 Family: family history includes Alzheimer's disease in her father; Hypertension in her mother.  Laboratory Chemistry Profile   Renal Lab Results  Component Value Date   BUN 15 02/27/2024   CREATININE 0.97 02/27/2024   GFRAA >60 01/04/2016   GFRNONAA >60 02/27/2024    Hepatic Lab Results  Component Value Date   AST 28 02/27/2024   ALT 23 02/27/2024   ALBUMIN 4.5 02/27/2024   ALKPHOS 89 02/27/2024   AMYLASE 30 02/27/2010   LIPASE 22 10/12/2012    Electrolytes Lab Results  Component Value Date   NA 144 02/27/2024   K 3.2 (L) 02/27/2024   CL 107 02/27/2024   CALCIUM 9.6 02/27/2024   MG 2.1 01/04/2016    Bone Lab Results  Component Value Date   25OHVITD1 49 01/04/2016   25OHVITD2 25 01/04/2016   25OHVITD3 24 01/04/2016     Inflammation (CRP: Acute Phase) (ESR: Chronic Phase) Lab Results  Component Value Date   CRP <0.5 01/04/2016   ESRSEDRATE 17 01/04/2016         Note: Above Lab results reviewed.  Recent Imaging Review  DG PAIN CLINIC C-ARM 1-60 MIN NO REPORT Fluoro was used, but no Radiologist interpretation will be provided.  Please refer to NOTES tab for provider progress note. Note: Reviewed        Physical Exam  Vitals: BP 122/64   Pulse 91   Temp 98.1 F (36.7 C)   Resp 16   Ht 5' 6 (1.676 m)   Wt 163 lb (73.9 kg)   SpO2 100%   BMI 26.31 kg/m  BMI: Estimated body mass index is 26.31 kg/m as calculated from the following:   Height as of this encounter: 5' 6 (1.676 m).   Weight as of this encounter: 163 lb (73.9 kg). Ideal: Ideal body weight: 59.3 kg (130 lb 11.7  oz) Adjusted ideal body weight: 65.2 kg (143 lb 10.2 oz) General appearance: Well nourished, well developed, and well hydrated. In no apparent acute distress Mental status: Alert, oriented x 3 (person, place, & time)       Respiratory: No evidence of acute respiratory distress Eyes: PERLA   Assessment   Diagnosis Status  1. Chronic pain syndrome   2. Chronic flank pain (1ry area of Pain) (Left)   3. Encounter for chronic pain management   4. Long term prescription opiate use   5. Chronic use of opiate for therapeutic purpose   6. Medication management   7. Chronic abdominal pain (2ry area of Pain) (Bilateral) (L>R)   8. Chronic low back pain (3ry area of Pain) (Bilateral) w/o sciatica    Controlled Controlled Controlled   Updated Problems: No problems updated.  Plan of Care  Problem-specific:  Assessment and Plan  Discontinue and Discard unused Butrans  10 mcg patch at the clinic as per protocol   We will continue on current medication regimen.  Prescribing drug monitoring (PDMP) reviewed; consistent with the use of prescribed medication and no evidence of narcotic misuse or abuse.  Urine drug  screening (UDS) up-to-date. No other new issues or problems reported to this visit.  Schedule follow-up in 90 days for medication management.  Ms. Shanaya Schneck has a current medication list which includes the following long-term medication(s): bupropion, diclofenac  sodium, and lansoprazole.  Pharmacotherapy (Medications Ordered): Meds ordered this encounter  Medications   DISCONTD: morphine  (MSIR) 15 MG tablet    Sig: Take 1 tablet (15 mg total) by mouth every 6 (six) hours as needed for moderate pain (pain score 4-6) or severe pain (pain score 7-10). Must last 30 days.    Dispense:  120 tablet    Refill:  0    Chronic Pain: STOP Act (Not applicable) Fill 1 day early if closed on refill date. Avoid benzodiazepines within 8 hours of opioids   DISCONTD: morphine  (MSIR) 15 MG tablet    Sig: Take 1 tablet (15 mg total) by mouth every 6 (six) hours as needed for moderate pain (pain score 4-6) or severe pain (pain score 7-10). Must last 30 days.    Dispense:  120 tablet    Refill:  0    Chronic Pain: STOP Act (Not applicable) Fill 1 day early if closed on refill date. Avoid benzodiazepines within 8 hours of opioids   DISCONTD: morphine  (MSIR) 15 MG tablet    Sig: Take 1 tablet (15 mg total) by mouth every 6 (six) hours as needed for moderate pain (pain score 4-6) or severe pain (pain score 7-10). Must last 30 days.    Dispense:  120 tablet    Refill:  0    Chronic Pain: STOP Act (Not applicable) Fill 1 day early if closed on refill date. Avoid benzodiazepines within 8 hours of opioids   morphine  (MSIR) 15 MG tablet    Sig: Take 1 tablet (15 mg total) by mouth every 8 (eight) hours. Must last 30 days.    Dispense:  90 tablet    Refill:  0    Chronic Pain: STOP Act (Not applicable) Fill 1 day early if closed on refill date. Avoid benzodiazepines within 8 hours of opioids   morphine  (MSIR) 15 MG tablet    Sig: Take 1 tablet (15 mg total) by mouth every 8 (eight) hours. Must last 30 days.     Dispense:  90 tablet    Refill:  0  Chronic Pain: STOP Act (Not applicable) Fill 1 day early if closed on refill date. Avoid benzodiazepines within 8 hours of opioids   morphine  (MSIR) 15 MG tablet    Sig: Take 1 tablet (15 mg total) by mouth every 8 (eight) hours. Must last 30 days.    Dispense:  90 tablet    Refill:  0    Chronic Pain: STOP Act (Not applicable) Fill 1 day early if closed on refill date. Avoid benzodiazepines within 8 hours of opioids   Orders:  No orders of the defined types were placed in this encounter.       Return in about 3 months (around 07/07/2024) for (F2F), (MM), Emmy Blanch NP.    Recent Visits Date Type Provider Dept  03/16/24 Office Visit Stefanos Haynesworth K, NP [facility]  03/08/24 Office Visit Blanch Emmy POUR, NP [facility]  03/02/24 Office Visit Tanya Glisson, MD [facility]  02/26/24 Office Visit Shaketta Rill K, NP [facility]  02/23/24 Office Visit Kalya Troeger K, NP [facility]  01/14/24 Office Visit Abbye Lao K, NP [facility]  Showing recent visits within past 90 days and meeting all other requirements Today's Visits Date Type Provider Dept  04/06/24 Office Visit Taraji Mungo K, NP [facility]  Showing today's visits and meeting all other requirements Future Appointments Date Type Provider Dept  06/29/24 Appointment Alekhya Gravlin K, NP [facility]  Showing future appointments within next 90 days and meeting all other requirements  I discussed the assessment and treatment plan with the patient. The patient was provided an opportunity to ask questions and all were answered. The patient agreed with the plan and demonstrated an understanding of the instructions.  Patient advised to call back or seek an in-person evaluation if the symptoms or condition worsens.  Duration of encounter: 30 minutes.  Total time on encounter, as per AMA guidelines included both  the face-to-face and non-face-to-face time personally spent by the physician and/or other qualified health care professional(s) on the day of the encounter (includes time in activities that require the physician or other qualified health care professional and does not include time in activities normally performed by clinical staff). Physician's time may include the following activities when performed: Preparing to see the patient (e.g., pre-charting review of records, searching for previously ordered imaging, lab work, and nerve conduction tests) Review of prior analgesic pharmacotherapies. Reviewing PMP Interpreting ordered tests (e.g., lab work, imaging, nerve conduction tests) Performing post-procedure evaluations, including interpretation of diagnostic procedures Obtaining and/or reviewing separately obtained history Performing a medically appropriate examination and/or evaluation Counseling and educating the patient/family/caregiver Ordering medications, tests, or procedures Referring and communicating with other health care professionals (when not separately reported) Documenting clinical information in the electronic or other health record Independently interpreting results (not separately reported) and communicating results to the patient/ family/caregiver Care coordination (not separately reported)  Note by: Zan Orlick K Nadia Torr, NP (TTS and AI technology used. I apologize for any typographical errors that were not detected and corrected.) Date: 04/06/2024; Time: 11:30 AM

## 2024-04-06 NOTE — Progress Notes (Signed)
 Nursing Pain Medication Assessment:  Safety precautions to be maintained throughout the outpatient stay will include: orient to surroundings, keep bed in low position, maintain call bell within reach at all times, provide assistance with transfer out of bed and ambulation.  Medication Inspection Compliance: Pill count conducted under aseptic conditions, in front of the patient. Neither the pills nor the bottle was removed from the patient's sight at any time. Once count was completed pills were immediately returned to the patient in their original bottle.  Medication #1: Buprenorphine  patch (Butrans ) Pill/Patch Count: 2 of 4 pills/patches remain Pill/Patch Appearance: Markings consistent with prescribed medication Bottle Appearance: Standard pharmacy container. Clearly labeled. Filled Date: 7 / 80 / 2025 Last Medication intake:  03/16/24  Medication #2: Morphine  IR Pill/Patch Count: 1/120 Pill/Patch Appearance: Markings consistent with prescribed medication Bottle Appearance: Standard pharmacy container. Clearly labeled. Filled Date: 8 / 6 / 2025 Last Medication intake:  Today

## 2024-04-06 NOTE — Progress Notes (Signed)
 Butras Patches 2 of 4 patches discarded at Outpatient Pharmacy per protocol, with patient and husband.

## 2024-04-07 DIAGNOSIS — H0102B Squamous blepharitis left eye, upper and lower eyelids: Secondary | ICD-10-CM | POA: Diagnosis not present

## 2024-04-07 DIAGNOSIS — H00012 Hordeolum externum right lower eyelid: Secondary | ICD-10-CM | POA: Diagnosis not present

## 2024-04-07 DIAGNOSIS — H524 Presbyopia: Secondary | ICD-10-CM | POA: Diagnosis not present

## 2024-04-07 DIAGNOSIS — H52223 Regular astigmatism, bilateral: Secondary | ICD-10-CM | POA: Diagnosis not present

## 2024-04-07 DIAGNOSIS — H0102A Squamous blepharitis right eye, upper and lower eyelids: Secondary | ICD-10-CM | POA: Diagnosis not present

## 2024-04-07 DIAGNOSIS — H2513 Age-related nuclear cataract, bilateral: Secondary | ICD-10-CM | POA: Diagnosis not present

## 2024-04-08 DIAGNOSIS — Z0000 Encounter for general adult medical examination without abnormal findings: Secondary | ICD-10-CM | POA: Diagnosis not present

## 2024-04-08 DIAGNOSIS — Z23 Encounter for immunization: Secondary | ICD-10-CM | POA: Diagnosis not present

## 2024-04-14 ENCOUNTER — Other Ambulatory Visit: Payer: Self-pay | Admitting: Nurse Practitioner

## 2024-04-14 ENCOUNTER — Encounter: Admitting: Nurse Practitioner

## 2024-04-16 DIAGNOSIS — H2513 Age-related nuclear cataract, bilateral: Secondary | ICD-10-CM | POA: Diagnosis not present

## 2024-04-16 DIAGNOSIS — H04123 Dry eye syndrome of bilateral lacrimal glands: Secondary | ICD-10-CM | POA: Diagnosis not present

## 2024-04-16 DIAGNOSIS — H0102B Squamous blepharitis left eye, upper and lower eyelids: Secondary | ICD-10-CM | POA: Diagnosis not present

## 2024-04-16 DIAGNOSIS — H524 Presbyopia: Secondary | ICD-10-CM | POA: Diagnosis not present

## 2024-04-16 DIAGNOSIS — H43822 Vitreomacular adhesion, left eye: Secondary | ICD-10-CM | POA: Diagnosis not present

## 2024-04-16 DIAGNOSIS — H52223 Regular astigmatism, bilateral: Secondary | ICD-10-CM | POA: Diagnosis not present

## 2024-04-19 ENCOUNTER — Other Ambulatory Visit: Payer: Self-pay | Admitting: *Deleted

## 2024-04-19 ENCOUNTER — Telehealth: Payer: Self-pay | Admitting: Nurse Practitioner

## 2024-04-19 MED ORDER — ONDANSETRON 4 MG PO TBDP: 4.0000 mg | ORAL_TABLET | Freq: Three times a day (TID) | ORAL | 0 refills | Status: DC | PRN

## 2024-04-19 NOTE — Telephone Encounter (Signed)
 Rx request sent to [facility].

## 2024-04-19 NOTE — Telephone Encounter (Signed)
 PT called stated that she needs a refill on Zofranodt

## 2024-04-20 DIAGNOSIS — R1310 Dysphagia, unspecified: Secondary | ICD-10-CM | POA: Diagnosis not present

## 2024-04-20 DIAGNOSIS — K911 Postgastric surgery syndromes: Secondary | ICD-10-CM | POA: Diagnosis not present

## 2024-04-20 DIAGNOSIS — M179 Osteoarthritis of knee, unspecified: Secondary | ICD-10-CM | POA: Diagnosis not present

## 2024-04-20 DIAGNOSIS — H2513 Age-related nuclear cataract, bilateral: Secondary | ICD-10-CM | POA: Diagnosis not present

## 2024-04-20 DIAGNOSIS — G894 Chronic pain syndrome: Secondary | ICD-10-CM | POA: Diagnosis not present

## 2024-05-11 DIAGNOSIS — H2512 Age-related nuclear cataract, left eye: Secondary | ICD-10-CM | POA: Diagnosis not present

## 2024-05-13 DIAGNOSIS — I10 Essential (primary) hypertension: Secondary | ICD-10-CM | POA: Diagnosis not present

## 2024-05-13 DIAGNOSIS — Z980 Intestinal bypass and anastomosis status: Secondary | ICD-10-CM | POA: Diagnosis not present

## 2024-05-13 DIAGNOSIS — R1310 Dysphagia, unspecified: Secondary | ICD-10-CM | POA: Diagnosis not present

## 2024-05-13 DIAGNOSIS — Z9884 Bariatric surgery status: Secondary | ICD-10-CM | POA: Diagnosis not present

## 2024-05-13 DIAGNOSIS — R1110 Vomiting, unspecified: Secondary | ICD-10-CM | POA: Diagnosis not present

## 2024-05-25 DIAGNOSIS — H2512 Age-related nuclear cataract, left eye: Secondary | ICD-10-CM | POA: Diagnosis not present

## 2024-06-15 ENCOUNTER — Other Ambulatory Visit: Payer: Self-pay | Admitting: Family Medicine

## 2024-06-15 DIAGNOSIS — K224 Dyskinesia of esophagus: Secondary | ICD-10-CM

## 2024-06-15 DIAGNOSIS — R1310 Dysphagia, unspecified: Secondary | ICD-10-CM

## 2024-06-29 ENCOUNTER — Ambulatory Visit: Attending: Nurse Practitioner | Admitting: Nurse Practitioner

## 2024-06-29 ENCOUNTER — Encounter: Payer: Self-pay | Admitting: Nurse Practitioner

## 2024-06-29 VITALS — BP 96/80 | HR 93 | Temp 97.0°F | Resp 18 | Ht 66.0 in | Wt 160.0 lb

## 2024-06-29 DIAGNOSIS — F1123 Opioid dependence with withdrawal: Secondary | ICD-10-CM | POA: Diagnosis present

## 2024-06-29 DIAGNOSIS — Z79891 Long term (current) use of opiate analgesic: Secondary | ICD-10-CM | POA: Insufficient documentation

## 2024-06-29 DIAGNOSIS — M5414 Radiculopathy, thoracic region: Secondary | ICD-10-CM | POA: Diagnosis present

## 2024-06-29 DIAGNOSIS — G8929 Other chronic pain: Secondary | ICD-10-CM | POA: Insufficient documentation

## 2024-06-29 DIAGNOSIS — R10A2 Flank pain, left side: Secondary | ICD-10-CM | POA: Diagnosis present

## 2024-06-29 DIAGNOSIS — G894 Chronic pain syndrome: Secondary | ICD-10-CM | POA: Diagnosis present

## 2024-06-29 DIAGNOSIS — G588 Other specified mononeuropathies: Secondary | ICD-10-CM | POA: Insufficient documentation

## 2024-06-29 MED ORDER — MORPHINE SULFATE 15 MG PO TABS: 15.0000 mg | ORAL_TABLET | Freq: Three times a day (TID) | ORAL | 0 refills | Status: AC

## 2024-06-29 NOTE — Progress Notes (Signed)
 PROVIDER NOTE: Interpretation of information contained herein should be left to medically-trained personnel. Specific patient instructions are provided elsewhere under Patient Instructions section of medical record. This document was created in part using AI and STT-dictation technology, any transcriptional errors that may result from this process are unintentional.  Patient: Kari Gibbs  Service: E/M   PCP: Aisha Harvey, MD  DOB: September 07, 1950  DOS: 06/29/2024  Provider: Emmy MARLA Blanch, NP  MRN: [id number]  Delivery: Face-to-face  Specialty: Interventional Pain Management  Type: Established Patient  Setting: Ambulatory outpatient facility  Specialty designation: 09  Referring Prov.: Aisha Harvey, MD  Location: Outpatient office facility       History of present illness (HPI) Kari Gibbs, a 73 y.o. year old female, is here today because of her Radicular pain of thoracic region [M54.14]. Kari Gibbs primary complain today is Abdominal Pain  Pertinent problems: Ms. Brazee has Chronic pain syndrome; Chronic flank pain (1ry area of Pain) (Left); Chronic thoracic radicular pain (Left-sided); Chronic occipital neuralgia (Left); Neuropathic pain; Neurogenic pain; History of rib fracture (4th and 8th rib) (Left); Vertebral body hemangioma (T11); Metatarsalgia of foot (Right); Chronic abdominal pain (2ry area of Pain) (Bilateral) (L>R); and Primary osteoarthritis on their pertinent problem list.   Pain Assessment: Severity of Chronic pain is reported as a 3 /10. Location: Abdomen Left/Around to back. Onset: More than a month ago. Quality: Tightness. Timing: Intermittent. Modifying factor(s): Medication, Walking. Vitals:  height is 5' 6 (1.676 m) and weight is 160 lb (72.6 kg). Her temporal temperature is 97 F (36.1 C) (abnormal). Her blood pressure is 96/80 and her pulse is 93. Her respiration is 18 and oxygen saturation is 96%.  BMI: Estimated body mass index is 25.82 kg/m as calculated  from the following:   Height as of this encounter: 5' 6 (1.676 m).   Weight as of this encounter: 160 lb (72.6 kg).  Last encounter: 04/06/2024. Last procedure: Visit date not found.  Reason for encounter: medication management. The patient indicates doing well with current medication regimen. No side effects or adverse reaction reported to medication.   Discussed the use of AI scribe software for clinical note transcription with the patient, who gave verbal consent to proceed.  History of Present Illness   Kari Gibbs is a 73 year old female who presents for pain management due to chronic abdominal pain and weight loss.  She describes her abdominal pain as currently being at a level of 3/10, which is lower than usual. She has experienced significant weight loss, approximately 40 pounds since December of last year, which she attributes to difficulty eating due to her abdominal issues. She describes episodes where food feels stuck and her throat feels completely closed off, leading to regurgitation.  She has undergone multiple diagnostic procedures including three endoscopies, a manometry test, and a barium swallow test. She reports that the manometry test was performed and that there was discussion about possible issues at the bottom of the esophagus, which may be related to scar tissue from previous surgery. Attempts were made to stretch the bottom of the esophagus.  She is seeking clearance for knee replacement surgery, which has been delayed due to her current health issues. She is currently on morphine  for pain management and has recently switched her prescription to [facility] for better service.  She is planning to travel out of town from December 5th to December 15th and has coordinated with her pharmacy to ensure her medication is available before her departure. She is  planning a trip to visit her granddaughters, who are involved in a play, and she does not want to miss this event. She  mentions the logistical challenges of traveling 300 miles and coordinating her medication schedule around this trip.     Pharmacotherapy Assessment   Morphine  (MS IR) 15 mg tablet every 8 hours as needed for pain. (Started on 03/16/2024) MME=60 Monitoring: [place] PMP: PDMP reviewed during this encounter.       Pharmacotherapy: No side-effects or adverse reactions reported. Compliance: No problems identified. Effectiveness: Clinically acceptable.  Kari Gibbs, Kari Gibbs  06/29/2024  9:48 AM  Sign when Signing Visit Nursing Pain Medication Assessment:  Safety precautions to be maintained throughout the outpatient stay will include: orient to surroundings, keep bed in low position, maintain call bell within reach at all times, provide assistance with transfer out of bed and ambulation.  Medication Inspection Compliance: Pill count conducted under aseptic conditions, in front of the patient. Neither the pills nor the bottle was removed from the patient's sight at any time. Once count was completed pills were immediately returned to the patient in their original bottle.  Medication: Morphine  IR Pill/Patch Count: 57 of 90 pills/patches remain Pill/Patch Appearance: Markings consistent with prescribed medication Bottle Appearance: Standard pharmacy container. Clearly labeled. Filled Date: 43 / 07 / 2025 Last Medication intake:  Today    UDS:  Summary  Date Value Ref Range Status  01/14/2024 FINAL  Final    Comment:    ==================================================================== [employer] [facility] 13 (MW) ==================================================================== Test                             Result       Flag       Units  Drug Present and Declared for Prescription Verification   Morphine                        >6536        EXPECTED   ng/mg creat   Normorphine                    371          EXPECTED   ng/mg creat    Potential sources of large amounts of morphine  in the  absence of    codeine  include administration of morphine  or use of heroin.     Normorphine is an expected metabolite of morphine .    Hydromorphone                   139          EXPECTED   ng/mg creat    Hydromorphone  may be present as a metabolite of morphine ;    concentrations of hydromorphone  rarely exceed 5% of the morphine     concentration when this is the source of hydromorphone .  ==================================================================== Test                      Result    Flag   Units      Ref Range   Creatinine              153              mg/dL      >=79 ==================================================================== Declared Medications:  The flagging and interpretation on this report are based on the  following declared medications.  Unexpected results may arise from  inaccuracies  in the declared medications.   **Note: The testing scope of this panel includes these medications:   Morphine  (MSIR)   **Note: The testing scope of this panel does not include the  following reported medications:   Acetaminophen  (Tylenol )  Benzonatate  (Tessalon )  Biotin  Bupropion (Wellbutrin XL)  Buspirone (Buspar)  Cranberry  Denosumab  (Prolia )  Diclofenac  (Voltaren )  Estradiol (Estrace)  Eye Drops  Folic Acid  Irbesartan (Avapro)  Iron  Lansoprazole (Prevacid)  Loratadine (Claritin)  Magnesium  Montelukast  (Singulair )  Multivitamin  Naloxone  (Narcan )  Potassium  Probiotic  Supplement  Vitamin B12  Vitamin C  Vitamin D3 ==================================================================== For clinical consultation, please call [phone]. ====================================================================     No results found for: CBDTHCR No results found for: D8THCCBX No results found for: D9THCCBX  ROS  Constitutional: Denies any fever or chills Gastrointestinal: No reported hemesis, hematochezia, vomiting, or acute GI  distress Musculoskeletal: Abdominal pain, thoracic pain Neurological: No reported episodes of acute onset apraxia, aphasia, dysarthria, agnosia, amnesia, paralysis, loss of coordination, or loss of consciousness  Medication Review  Biotin, Cranberry, Iron-Vitamin C, Magnesium, Peppermint Oil, Polyethyl Glycol-Propyl Glycol, Prenatal MV & Min w/FA-DHA, RA Probiotic Gummies, Vitamin D3, acetaminophen , benzonatate , buPROPion, busPIRone, denosumab , diclofenac  Sodium, estradiol, irbesartan, lansoprazole, loratadine, montelukast , morphine , ondansetron , potassium gluconate, and vitamin B-12  History Review  Allergy: Kari Gibbs is allergic to ace inhibitors, losartan potassium, neosporin [neomycin-bacitracin zn-polymyx], and bacitracin-polymyxin b. Drug: Kari Gibbs  reports no history of drug use. Alcohol:  reports current alcohol use. Tobacco:  reports that she has never smoked. She has never used smokeless tobacco. Social: Kari Gibbs  reports that she has never smoked. She has never used smokeless tobacco. She reports current alcohol use. She reports that she does not use drugs. 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 Family: family history includes Alzheimer's disease in her father; Hypertension in her mother.  Laboratory Chemistry Profile   Renal Lab Results  Component Value Date   BUN 15 02/27/2024   CREATININE 0.97 02/27/2024   GFRAA >60 01/04/2016   GFRNONAA >60 02/27/2024    Hepatic Lab Results  Component Value Date   AST 28 02/27/2024   ALT 23 02/27/2024   ALBUMIN 4.5 02/27/2024   ALKPHOS 89 02/27/2024   AMYLASE 30 02/27/2010   LIPASE 22 10/12/2012    Electrolytes Lab Results  Component Value Date   NA 144 02/27/2024   K 3.2 (L) 02/27/2024   CL 107 02/27/2024   CALCIUM 9.6 02/27/2024   MG 2.1 01/04/2016    Bone Lab Results  Component Value Date   25OHVITD1 49 01/04/2016   25OHVITD2 25 01/04/2016   25OHVITD3 24 01/04/2016    Inflammation (CRP: Acute Phase) (  ESR: Chronic Phase) Lab Results  Component Value Date   CRP <0.5 01/04/2016   ESRSEDRATE 17 01/04/2016         Note: Above Lab results reviewed.  Recent Imaging Review  DG PAIN CLINIC C-ARM 1-60 MIN NO REPORT Fluoro was used, but no Radiologist interpretation will be provided.  Please refer to NOTES tab for provider progress note. Note: Reviewed        Physical Exam  Vitals: BP 96/80   Pulse 93   Temp (!) 97 F (36.1 C) (Temporal)   Resp 18   Ht 5' 6 (1.676 m)   Wt 160 lb (72.6 kg)   SpO2 96%   BMI 25.82 kg/m  BMI: Estimated body mass index is 25.82 kg/m as calculated from the following:   Height as of this encounter: 5' 6 (1.676 m).   Weight as of this encounter: 160 lb (72.6 kg). Ideal: Ideal body weight: 59.3 kg (130 lb 11.7 oz) Adjusted ideal body weight: 64.6 kg (142 lb 7  oz) General appearance: Well nourished, well developed, and well hydrated. In no apparent acute distress Mental status: Alert, oriented x 3 (person, place, & time)       Respiratory: No evidence of acute respiratory distress Eyes: PERLA  Musculoskeletal: +thoracic pain  Abdominal pain  Assessment   Diagnosis Status  1. Radicular pain of thoracic region   2. 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 Chronic pain syndrome   4. Chronic flank pain (1ry area of Pain) (Left)   5. Long term prescription opiate use   6. Opioid dependence with withdrawal (HCC)    Controlled Controlled Controlled   Updated Problems: No problems updated.  Plan of Care  Problem-specific:  Assessment and Plan    Chronic pain syndrome managed with opioid therapy Chronic pain managed with morphine , pain level 3/10. Concerns about medication timing due to travel. - Coordinated with pharmacy to fill morphine  prescription on December 4th to accommodate travel plans. - Sent prescription to [facility], [place], [address] Patient's pain is controlled with Morphine , will continue on current medication regimen.   Prescribing drug monitoring (PDMP) reviewed; consistent with the use of prescribed medication and no evidence of narcotic misuse or abuse.  Urine drug screening (UDS) up-to-date. Schedule follow up in 90 days for medication management.   Abdominal pain with dysphagia and weight loss, under evaluation Abdominal pain with dysphagia and weight loss. Scar tissue suspected at esophagus, further evaluation needed. - Proceed with scheduled barium swallow test.  Planned bilateral knee replacement surgery, preoperative pain management and clearance Preoperative clearance required for pain management post-surgery. Coordination with orthopedic surgeon and primary physician needed. - Signed clearance form for postoperative pain management. - Coordinated with orthopedic surgeon and primary physician  for preoperative clearance.       Kari Gibbs has a current medication list which includes the following long-term medication(s): bupropion, diclofenac  sodium, and lansoprazole.  Pharmacotherapy (Medications Ordered): Meds ordered this encounter  Medications   morphine  (MSIR) 15 MG tablet    Sig: Take 1 tablet (15 mg total) by mouth every 8 (eight) hours. Must last 30 days.    Dispense:  90 tablet    Refill:  0    May Fill on 07/15/2024 for only this month. (She is leaving out of town on 12/05). Avoid benzodiazepines within 8 hours of opioids   morphine  (MSIR) 15 MG tablet    Sig: Take 1 tablet (15 mg total) by mouth every 8 (  eight) hours. Must last 30 days.    Dispense:  90 tablet    Refill:  0    Chronic Pain: STOP Act (Not applicable) Fill 1 day early if closed on refill date. Avoid benzodiazepines within 8 hours of opioids   morphine  (MSIR) 15 MG tablet    Sig: Take 1 tablet (15 mg total) by mouth every 8 (eight) hours. Must last 30 days.    Dispense:  90 tablet    Refill:  0    Chronic Pain: STOP Act (Not applicable) Fill 1 day early if closed on refill date. Avoid benzodiazepines within 8 hours of opioids   Orders:  No orders of the defined types were placed in this encounter.    Return in about 3 months (around 09/29/2024) for (F2F), (MM), Emmy Blanch NP.    Recent Visits Date Type Provider Dept  04/06/24 Office Visit Lakea Mittelman K, NP [facility]  Showing recent visits within past 90 days and meeting all other requirements Today's Visits Date Type Provider Dept  06/29/24 Office Visit Patton Rabinovich K, NP [facility]  Showing today's visits and meeting all other requirements Future Appointments Date Type Provider Dept  09/21/24 Appointment Hedda Crumbley K, NP [facility]  Showing future appointments within next 90 days and meeting all other requirements  I discussed the assessment and treatment plan with the patient. The patient  was provided an opportunity to ask questions and all were answered. The patient agreed with the plan and demonstrated an understanding of the instructions.  Patient advised to call back or seek an in-person evaluation if the symptoms or condition worsens.  I personally spent a total of 30 minutes in the care of the patient today including preparing to see the patient, getting/reviewing separately obtained history, performing a medically appropriate exam/evaluation, counseling and educating, placing orders, referring and communicating with other health care professionals, documenting clinical information in the EHR, independently interpreting results, communicating results, and coordinating care.   Note by: Karmyn Lowman K Drayk Humbarger, NP (TTS and AI technology used. I apologize for any typographical errors that were not detected and corrected.) Date: 06/29/2024; Time: 10:37 AM

## 2024-06-29 NOTE — Progress Notes (Signed)
 Nursing Pain Medication Assessment:  Safety precautions to be maintained throughout the outpatient stay will include: orient to surroundings, keep bed in low position, maintain call bell within reach at all times, provide assistance with transfer out of bed and ambulation.  Medication Inspection Compliance: Pill count conducted under aseptic conditions, in front of the patient. Neither the pills nor the bottle was removed from the patient's sight at any time. Once count was completed pills were immediately returned to the patient in their original bottle.  Medication: Morphine  IR Pill/Patch Count: 57 of 90 pills/patches remain Pill/Patch Appearance: Markings consistent with prescribed medication Bottle Appearance: Standard pharmacy container. Clearly labeled. Filled Date: 27 / 07 / 2025 Last Medication intake:  Today

## 2024-07-01 ENCOUNTER — Ambulatory Visit
Admission: RE | Admit: 2024-07-01 | Discharge: 2024-07-01 | Disposition: A | Discharge status: Home / Self Care | Source: Ambulatory Visit | Attending: Family Medicine | Admitting: Family Medicine

## 2024-07-01 DIAGNOSIS — R1310 Dysphagia, unspecified: Secondary | ICD-10-CM

## 2024-07-01 DIAGNOSIS — K224 Dyskinesia of esophagus: Secondary | ICD-10-CM

## 2024-07-02 ENCOUNTER — Encounter: Payer: Self-pay | Admitting: Nurse Practitioner

## 2024-07-02 ENCOUNTER — Telehealth: Payer: Self-pay | Admitting: Nurse Practitioner

## 2024-07-02 NOTE — Telephone Encounter (Signed)
 Patient states she spoke with [facility] when she was here for MM appt. She needs to pick up her meds on 07-15-24 and [facility] was ok with this. But something on the script is telling pharmacy they cannot fill until 07-18-24. Please call pharmacy and check on this / advise patient

## 2024-07-05 NOTE — Telephone Encounter (Signed)
 Spoke to Pharmacy, will call back this afternoon and speak to pharmacist on duty after 2pm. He will also be the Pharmacist working on 12/04/9m and will verbally need to speak to provider office in order fill early.

## 2024-08-28 ENCOUNTER — Other Ambulatory Visit: Payer: Self-pay | Admitting: Nurse Practitioner

## 2024-09-08 ENCOUNTER — Encounter: Payer: Self-pay | Admitting: Nurse Practitioner

## 2024-09-08 ENCOUNTER — Other Ambulatory Visit: Payer: Self-pay | Admitting: *Deleted

## 2024-09-08 MED ORDER — ONDANSETRON 4 MG PO TBDP: 4.0000 mg | ORAL_TABLET | Freq: Three times a day (TID) | ORAL | 2 refills | Status: AC | PRN

## 2024-09-08 NOTE — Telephone Encounter (Signed)
 Refill request sent to [facility].09/08/2024.

## 2024-09-21 ENCOUNTER — Encounter: Admitting: Nurse Practitioner

## 2024-10-07 ENCOUNTER — Other Ambulatory Visit (HOSPITAL_BASED_OUTPATIENT_CLINIC_OR_DEPARTMENT_OTHER)

## 2025-04-26 ENCOUNTER — Ambulatory Visit (HOSPITAL_BASED_OUTPATIENT_CLINIC_OR_DEPARTMENT_OTHER)
